# Patient Record
Sex: Male | Born: 1935 | Race: Black or African American | Hispanic: No | Smoking: Former smoker
Health system: Southern US, Community
[De-identification: ages and names within clinical notes are randomized; demographics above are authoritative.]

## PROBLEM LIST (undated history)

## (undated) DIAGNOSIS — E119 Type 2 diabetes mellitus without complications: Secondary | ICD-10-CM

## (undated) DIAGNOSIS — E785 Hyperlipidemia, unspecified: Secondary | ICD-10-CM

## (undated) DIAGNOSIS — I2510 Atherosclerotic heart disease of native coronary artery without angina pectoris: Secondary | ICD-10-CM

## (undated) DIAGNOSIS — I2109 ST elevation (STEMI) myocardial infarction involving other coronary artery of anterior wall: Secondary | ICD-10-CM

## (undated) DIAGNOSIS — I10 Essential (primary) hypertension: Secondary | ICD-10-CM

## (undated) HISTORY — DX: Essential (primary) hypertension: I10

## (undated) HISTORY — PX: CATARACT EXTRACTION: SUR2

## (undated) HISTORY — DX: ST elevation (STEMI) myocardial infarction involving other coronary artery of anterior wall: I21.09

## (undated) HISTORY — DX: Type 2 diabetes mellitus without complications: E11.9

## (undated) HISTORY — DX: Atherosclerotic heart disease of native coronary artery without angina pectoris: I25.10

## (undated) HISTORY — DX: Hyperlipidemia, unspecified: E78.5

---

## 2002-01-06 DIAGNOSIS — I2109 ST elevation (STEMI) myocardial infarction involving other coronary artery of anterior wall: Secondary | ICD-10-CM

## 2002-01-06 DIAGNOSIS — I2119 ST elevation (STEMI) myocardial infarction involving other coronary artery of inferior wall: Secondary | ICD-10-CM

## 2002-01-06 HISTORY — DX: ST elevation (STEMI) myocardial infarction involving other coronary artery of inferior wall: I21.19

## 2002-01-06 HISTORY — DX: ST elevation (STEMI) myocardial infarction involving other coronary artery of anterior wall: I21.09

## 2002-09-04 ENCOUNTER — Emergency Department (HOSPITAL_COMMUNITY): Admission: EM | Admit: 2002-09-04 | Discharge: 2002-09-04 | Payer: Self-pay | Admitting: Emergency Medicine

## 2002-09-04 ENCOUNTER — Inpatient Hospital Stay (HOSPITAL_COMMUNITY): Admission: EM | Admit: 2002-09-04 | Discharge: 2002-09-09 | Payer: Self-pay | Admitting: *Deleted

## 2002-09-04 ENCOUNTER — Encounter: Payer: Self-pay | Admitting: Emergency Medicine

## 2002-09-05 HISTORY — PX: CORONARY ANGIOPLASTY WITH STENT PLACEMENT: SHX49

## 2008-11-01 HISTORY — PX: NM MYOCAR PERF WALL MOTION: HXRAD629

## 2009-09-14 ENCOUNTER — Emergency Department (HOSPITAL_COMMUNITY): Admission: EM | Admit: 2009-09-14 | Discharge: 2009-09-14 | Payer: Self-pay | Admitting: Emergency Medicine

## 2010-05-24 NOTE — Cardiovascular Report (Signed)
NAME:  Terrance Miller, Terrance Miller NO.:  [id number]   MEDICAL RECORD NO.:  [id number]                   PATIENT TYPE:  INP   LOCATION:                                       FACILITY:  [facility]   PHYSICIAN:  Nicki Guadalajara, M.D.                  DATE OF BIRTH:  [birth year]   DATE OF PROCEDURE:  DATE OF DISCHARGE:                              CARDIAC CATHETERIZATION   HISTORY OF PRESENT ILLNESS:  [name] is a 77 year old African-  American male who has never seen a doctor.  At approximately 5 p.m. on  Saturday evening while cutting the grass he developed significant substernal  chest pressure.  The chest pain persisted essentially all night.  He  ultimately presented to [facility] on Sunday morning where his ECG  suggested possible evolving inferior wall MI.  The patient was heparinized  and shipped to [facility].  Upon arrival to [facility] the  patient was significantly improved with reference to chest pain.  At that  time discussion was made concerning possible emergent catheterization but  the catheterization laboratory was tied up with another emergency.  Consequently, the patient was given Integrilin.  He continued to be entirely  pain-free.  With the onset from initial symptoms of approximately 19 hours,  decision was made to continue stabilization with plans for catheterization  today.  Initial CPK was 785 with a 155 MB fraction.  Peak CK was 1162 with a  212 MB fraction.  The patient has remained symptom-free.   PROCEDURE:  After pre medication with Valium 5 mg intravenously, the patient  was prepped and draped in the usual fashion.  His right femoral artery was  punctured anteriorly and a 6-French arterial sheath was inserted.  Diagnostic catheterization was done with 6-French Judkins 4 left and right  coronary catheters.  The right catheter was also used for selective  angiography and the left subclavian internal mammary  artery system in the  event that patient ultimately required CBG surgery.  Pigtail catheter was  used for biplane cineangiographic left ventriculography as well as distal  aortography.  With the demonstration of total occlusion of the RCA, but with  only mild hypocontractility in the inferolateral segment with some  suggestion of some faint collaterals, although very minimal, the decision  was made to attempt intervention to open up his probably large right  coronary system.  The patient had been maintained on Integrilin since  yesterday.  ACT was recorded and he was given 4800 units of weight adjusted  heparin.  200 mcg IC nitroglycerin was also administered down the right  coronary artery without any improvement.  The interventional procedure was  difficult secondary to initial catheter back-up difficulties and also  significant difficulty in crossing the total occlusion.  Initially, an FL4  right guide was used for the procedure and this  was ultimately changed to a  hockey stick guide with side holes.  Multiple wires were used with a 2.5 mm  Maverick balloon in an attempt to provide additional support to cross the  total occlusion.  The wires included a Patriot wire, an extra support  Trooper, a 300 cm Cross-It 300 and ultimately the total occlusion was  crossed with a mailman wire.  Once the lesion was crossed it was apparent  that there was very severe diffuse disease extending through the entire mid  segment almost just proximal to the region of the crux.  The 2.5 balloon was  used to dilate this entire proximal and mid region.  This was then upgraded  to a 3.0 x 30 mm Maverick balloon with dilatation in this entire segment.  This restored TIMI 3 flow.  Tandem stenting was then done with insertion of  a 3.0 x 33 mm drug eluting Cypher stent placed proximal to the crux in the  mid right coronary artery.  This was dilated to 16 atmospheres.  A second  stent was placed 3.0 x 23 mm in  tandem proximal to the initially placed  stent with this 23 mm extending to the most proximal portion of the native  RCA.  Again, this was dilated to 17 atmospheres.  A 3.5 x 20 mm Quantum  balloon was then used for post dilatation.  The entire stented segment was  initially dilated to 8 atmospheres.  Ultimately, taper was obtained such  that the most proximal portion was dilated to 3.5 mm and the distal portion  of the more distal stent was dilated to 3.42 mm.  This resulted in TIMI 3  flow.  There was no evidence for dissection.  The patient tolerated the  procedure well.  ACT was documented to be therapeutic.  Of note, prior to  starting the intervention, again, a venous sheath had been inserted in the  event pacemaker would be necessary.   HEMODYNAMIC DATA:  1. Central aortic pressure 105/65, mean 81.  2. Left ventricular pressure 105/15, post A-wave 19.   ANGIOGRAPHIC DATA:  1. Left main coronary artery was moderate sized vessel that had mild 10-20%     ostial taper and bifurcated into an LAD and left circumflex system.  2. The LAD gave rise to a proximal diagonal vessel which had 80% proximal     stenosis followed by 70-80% mid stenosis in this diagonal vessel.  The     LAD at the diagonal had diffuse narrowing of approximately 50% in a     region which also seemed to have a component of intramyocardial bridging.     The mid portion of the LAD had focal narrowing of 80-90%.  3. The circumflex had smooth 20% ostial narrowing.  After a takeoff of a     proximal OM 1 vessel the AV groove circumflex had smooth 70-80%     narrowing.  The OM 2 vessel was much smaller in caliber and had diffuse     narrowing of 50%.  The mid AV groove had narrowing of 80% and in the more     distal aspect of the circumflex there was 90-95% focal stenosis.  There     was faint collateralization to the distal RCA via the left coronary     injection. 4. The right coronary artery was totally occluded  proximally and there was     evidence for thrombus.  5. Because of the potential for possible CBG surgery,  left subclavian and     internal mammary artery were injected.  This did show a 50% narrowing in     the proximal subclavian artery prior to a LIMA takeoff.  The LIMA itself     was angiographically normal.   BIPLANE CINEANGIOGRAPHIC LEFT VENTRICULOGRAPHY:  Biplane cineangiographic  left ventriculography revealed preserved global contractility with an  ejection fraction of approximately 50 to less than 55%.  On the RAO  projection there was only very subtle, mild mid inferior hypocontractility.  On the LAO projection there was low inferolateral mild hypocontractility.   DISTAL AORTOGRAPHY:  Distal aortography did not demonstrate any renal artery  stenosis.  There was no significant aortoiliac disease.   Following difficult, but ultimately successful intervention to the right  coronary artery, once the right coronary artery was able to be crossed  successfully, it became apparent that the entire proximal and mid segment  were diffusely narrowed to 90+%.  Following successful balloon dilatation  and ultimate tandem stenting with a 3.0 x 33 mm Cypher stent placed in the  mid segment and a 3.0 x 23 mm stent placed in tandem extending proximally  with post stent dilatation to a vessel size of 3.5 proximally to 3.4  distally, the entire region was reduced to 0%.  It was TIMI 3 flow.  The RCA  was a large caliber vessel that gave rise to a large PDA system in mid  inferior LV branch as well as a posterolateral vessel.  There was 60%  narrowing in the mid distal PDA.  Initial TIMI flow at the start of the  procedure was TIMI 0, at the completion of the procedure was TIMI 3.   IMPRESSION:  1. Mild left ventricular dysfunction with mild mid inferior and low     posterolateral hypocontractility.  2. Multivessel coronary obstructive disease with 80% proximal first diagonal     and 70-80%  mid diagonal stenoses of the left anterior descending, 50%     proximal to mid left anterior descending stenoses in an intramyocardial     segment, and 80-90% mid left anterior descending stenosis, 70-80%     stenosis in the AV groove portion of the circumflex vessel after obtuse     marginal 1 takeoff with 50% narrowing in the small obtuse marginal 2     vessel and tandem 80 and 90% mid distal AV groove and distal circumflex     stenoses with faint left to right collaterals.  3. 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  4. Difficult, but successful coronary intervention of the right coronary     artery with resultant PTCA of a diffusely diseased right coronary artery     after the occluded segment and ultimate tandem stenting with a 3.0 x 33     mm and 3.0 x 23 mm drug eluting Cypher stent post dilated with a stent     taper from 3.5 to 3.4 mm done with Integrilin, weight adjusted    heparinization, as well as oral Plavix.   DISCUSSION:  [name] presented yesterday with an evolving inferior MI of  probably 19 hours' duration.  His ECG on presentation showed improvement in  the ST segment depression which had been present at [facility] in V4 and V6  and the patient remained pain-free with Integrilin, heparin, and  nitroglycerin as well as beta blocker therapy.  He also was started on  Statin therapy.  Catheterization today verifies total occlusion of  the RCA  but since there was not extensive akinesis in this segment, decision was  made to establish flow in this vessel.  There was some discussion concerning  CBG revascularization surgery.  However, once the right coronary artery was  opened there was very diffuse disease and it was felt that this would be  best treated with stenting rather than primary balloon angioplasty.  The  patient ultimately will be stabilized following his MI and plans will be  made for possible staged intervention of his other vascular  lesions if  appropriate.                                               Nicki Guadalajara, M.D.    TK/MEDQ  D:  09/05/2002  T:  09/05/2002  Job:  [id number]   cc:   Hilario Quarry, M.D.  [address]  [place]  [place] [address]  Fax: [phone]

## 2010-05-24 NOTE — Discharge Summary (Signed)
Terrance Miller, POORMAN [facility]                        ACCOUNT NO.:  [id number]   MEDICAL RECORD NO.:  [id number]                   PATIENT TYPE:  INP   LOCATION:  4712                                 FACILITY:  [facility]   PHYSICIAN:  Terrance Gobble, MD                    DATE OF BIRTH:  10-11-1928   DATE OF ADMISSION:  DATE OF DISCHARGE:  09/09/2002                                 DISCHARGE SUMMARY   DISCHARGE DIAGNOSES:  1. Acute inferior myocardial infarction with total right coronary artery     including rescue angioplasty and stent.  2. Residual coronary disease, first stage intervention of the left anterior     descending artery and left circumflex.  3. New type 2 diabetes mellitus.  4. Urinalysis urinary tract infection.  5. Hypertension, resolved.   CONDITION ON DISCHARGE:  Improved.   PROCEDURE:  On September 05, 2002, combined left heart cath by Dr. Nicki Miller  with 100% RCA stenosis.  On September 05, 2002, percutaneous coronary  angioplasty and stent deployment to the 100% stenoses right coronary artery,  placing 2 Cypher stents.   DISCHARGE MEDICATIONS:  1. Enteric-coated aspirin 81 mg daily.  2. Plavix 75 mg daily.  3. Protonix 40 mg daily for 1 month, then as needed.  4. Lipitor 20 mg daily at bedtime.  5. Avandia 40 mg 1 at breakfast.  6. Lasix 20 mg 1 daily.  7. Lopressor 1/2 of a 50 mg tablet 3 times a day.  8. Altace 2.5 daily.  9. Nitroglycerin 1/150 sublingual p.r.n. chest pain.  10.      Cipro 250 mg twice a day for 3 days.   DISCHARGE INSTRUCTIONS:  1. Have blood work done in 1 week in [place], September 15, 2002.  2. No strenuous activity, no lifting over 10 pounds, no driving, no work     until seen by Dr. Tresa Miller in the office.  3. Low fat, low salt diabetic diet.  4. Watch cath site for signs of any bleeding, swelling or drainage.  5. Check your glucose 4 times a day, keep a log to take to primary care     doctor.  6. Follow up with Dr. Tresa Miller September 27, 2002 at 11:30.  Need to see a     physician/primary care in [place] to monitor diabetes, possibly to     [facility].   HISTORY OF PRESENT ILLNESS:  A 77 year old male was seen in the emergency  room at [facility] with no past medical history at all, had never seen a  physician. Developed substernal chest pain on 09/03/02 while mowing.  No  associated symptoms of nausea or vomiting, shortness of breath, radiation or  diaphoresis.  He took aspirin x1 but had pain all night.  Went to [facility]  ER first on 09/04/02, EKG showed increased inferior ST changes.  CK-MB was  positive, and he still  had some discomfort.   PAST MEDICAL HISTORY:  Negative, no history of any surgery, no outpatient  medications.   ALLERGIES:  NO KNOWN ALLERGIES.   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  He raised  his wife's brother's children, 7 children.   FAMILY HISTORY:  No coronary disease.  One sister 19, father died at 53,  mother died at 72.   REVIEW OF SYSTEMS:  No GI bleeding, no dysuria, no chills.   DISCHARGE PHYSICAL EXAMINATION:  VITAL SIGNS:  Blood pressure 115/61, pulse  62, respirations 20, temperature 97.7, room air oxygen saturation 97%.  LUNGS:  Rare crackles right base, otherwise clear.  CARDIOVASCULAR:  Regular rate and rhythm, S1/S2.  ABDOMEN:  Obese, benign, postive bowel sounds.  EXTREMITIES:  With 1+ edema bilaterally.   LABORATORY DATA:  On admission:  Hemoglobin 11.9, hematocrit 35.4, WBC 1.7,  platelets 193, neutrophils 78, lymphs 13, monos 8, eos 0, baso 0.  Pro time 14.2, INR 1.1, PTT on heparin was therapeutic.  Chemistries:  Sodium 138, potassium 4.5, chloride 106, CO2 20, glucose 253, BUN 19,  creatinine 1.5, calcium 9.1, total protein 7.4, albumin 3.6.  Sodium dropped  to a low of 133, on discharge 134. Glucose ranged from 313 down to 156.  AST  was 80, ALT 41, total bilirubin 1.5, magnesium 1.9. AST did come down to 57,  ALT 57, probably secondary  to MI. WBC prior to discharge was down to 7.4,  hemoglobin 10.7, hematocrit 31.6, platelets 182.   Cardiac enzymes:  CK 1070, 1059, 1162, 1136 and 319.  MB 212, 212, 181,  11.1.  Troponin 11.59, 16.11 peak.  Cholesterol 198, triglyceride 61, HDL  48, LDL 138.   EKG on August 29:  Sinus rhythm with occasional PAC's, ST elevation and Q  waves in lead 3 and aVF.  Follow up on August 30:  Sinus rhythm, inferior  infarction, no ischemia suspected.  Again, follow up on August 30:  Inferior  infarction, sinus rhythm.  Chest x-ray results are not currently in the  chart.   Cardiac catheterization report initially 100% RCA stenosis, residual disease  in the circumflex and the LAD, for continued intervention, EF of about 50%.  Intervention as described previously.   HOSPITAL COURSE:  Terrance Miller was transferred from [facility] on the  evening of September 04, 2002 with chest pain and ST changes.  He was still on  IV heparin, nitroglycerin and Integrilin. He was placed in the CCU.  By  September 05, 2002, he was ready for cardiac catheterization which was  completed with results as stated. Cypher stents were placed.  The patient  continued to improve, had some peak fever of 101.2.  No further chest pain,  was feeling much better. Cardiac rehab saw him and ambulated the patient.  Diabetic coordinator saw the patient and medicines were adjusted for  diabetes.   By September 2, he was ambulating without problems. He had been given IV  fluids for hypotension, which resolved.  Plans were for staged PCI to the  LAD and the circumflex.   He continued to improve and by September 09, 2002, he was ready for discharge  home. The patient will follow up with Dr. Tresa Miller as an outpatient. Also, new  immediate primary care physician for diabetes management.      Darcella Gasman. [place], [place].P.  Terrance Gobble, MD   LRI/MEDQ  D:  09/22/2002  T:  09/24/2002  Job:  [id number]

## 2012-07-21 ENCOUNTER — Telehealth: Payer: Self-pay | Admitting: Cardiovascular Disease

## 2012-07-21 NOTE — Telephone Encounter (Signed)
Wants to know if you received fax on yesterday!

## 2012-07-21 NOTE — Telephone Encounter (Signed)
Returned a call to patient from earlier today in reference to a ? Order that was supposedly faxed. No answer or answering machine. I will await another call from the patient.

## 2012-07-26 ENCOUNTER — Other Ambulatory Visit: Payer: Self-pay | Admitting: *Deleted

## 2012-07-26 MED ORDER — METOPROLOL TARTRATE 50 MG PO TABS: 50.0000 mg | ORAL_TABLET | Freq: Two times a day (BID) | ORAL | Status: DC

## 2012-07-27 ENCOUNTER — Other Ambulatory Visit: Payer: Self-pay | Admitting: *Deleted

## 2012-07-27 MED ORDER — EZETIMIBE 10 MG PO TABS: 10.0000 mg | ORAL_TABLET | Freq: Every day | ORAL | Status: DC

## 2012-07-29 ENCOUNTER — Other Ambulatory Visit: Payer: Self-pay | Admitting: *Deleted

## 2012-07-29 MED ORDER — SIMVASTATIN 40 MG PO TABS: 40.0000 mg | ORAL_TABLET | Freq: Every day | ORAL | Status: DC

## 2013-02-11 ENCOUNTER — Telehealth: Payer: Self-pay | Admitting: *Deleted

## 2013-02-11 NOTE — Telephone Encounter (Signed)
Informed company not to send prescription request for patient's urology supplies. Dr. Claiborne Billings will not prescribe these supplies. We are the patient's cardiologist. They wii remove from list.

## 2013-03-28 ENCOUNTER — Other Ambulatory Visit: Payer: Self-pay

## 2013-03-28 MED ORDER — LISINOPRIL 10 MG PO TABS
10.0000 mg | ORAL_TABLET | Freq: Every day | ORAL | Status: DC
Start: 2013-03-28 — End: 2013-09-26

## 2013-03-28 MED ORDER — CLOPIDOGREL BISULFATE 75 MG PO TABS: 75.0000 mg | ORAL_TABLET | Freq: Every day | ORAL | Status: DC

## 2013-03-28 NOTE — Telephone Encounter (Signed)
Rx was sent to pharmacy electronically. 

## 2013-08-15 ENCOUNTER — Other Ambulatory Visit: Payer: Self-pay | Admitting: Cardiovascular Disease

## 2013-08-15 NOTE — Telephone Encounter (Signed)
Rx refill sent to patient pharmacy after speaking to patient daughter and reminding her he needs an appointment before any further refills can be advised

## 2013-09-26 ENCOUNTER — Other Ambulatory Visit: Payer: Self-pay | Admitting: *Deleted

## 2013-09-26 MED ORDER — EZETIMIBE 10 MG PO TABS: 10.0000 mg | ORAL_TABLET | Freq: Every day | ORAL | Status: DC

## 2013-09-26 MED ORDER — LISINOPRIL 10 MG PO TABS: 10.0000 mg | ORAL_TABLET | Freq: Every day | ORAL | Status: DC

## 2013-09-26 MED ORDER — CLOPIDOGREL BISULFATE 75 MG PO TABS
75.0000 mg | ORAL_TABLET | Freq: Every day | ORAL | Status: DC
Start: 2013-09-26 — End: 2013-10-31

## 2013-09-27 ENCOUNTER — Other Ambulatory Visit: Payer: Self-pay | Admitting: *Deleted

## 2013-09-28 ENCOUNTER — Other Ambulatory Visit: Payer: Self-pay | Admitting: Cardiovascular Disease

## 2013-09-28 ENCOUNTER — Encounter: Payer: Self-pay | Admitting: Cardiovascular Disease

## 2013-09-28 ENCOUNTER — Ambulatory Visit (INDEPENDENT_AMBULATORY_CARE_PROVIDER_SITE_OTHER): Payer: Medicare Other | Admitting: Cardiovascular Disease

## 2013-09-28 VITALS — BP 182/100 | HR 63 | Ht 66.0 in | Wt 198.7 lb

## 2013-09-28 DIAGNOSIS — I119 Hypertensive heart disease without heart failure: Secondary | ICD-10-CM

## 2013-09-28 DIAGNOSIS — E785 Hyperlipidemia, unspecified: Secondary | ICD-10-CM

## 2013-09-28 DIAGNOSIS — E559 Vitamin D deficiency, unspecified: Secondary | ICD-10-CM

## 2013-09-28 DIAGNOSIS — I2510 Atherosclerotic heart disease of native coronary artery without angina pectoris: Secondary | ICD-10-CM

## 2013-09-28 DIAGNOSIS — R5383 Other fatigue: Secondary | ICD-10-CM

## 2013-09-28 DIAGNOSIS — R5381 Other malaise: Secondary | ICD-10-CM

## 2013-09-28 DIAGNOSIS — I10 Essential (primary) hypertension: Secondary | ICD-10-CM | POA: Insufficient documentation

## 2013-09-28 DIAGNOSIS — E782 Mixed hyperlipidemia: Secondary | ICD-10-CM

## 2013-09-28 DIAGNOSIS — E1169 Type 2 diabetes mellitus with other specified complication: Secondary | ICD-10-CM | POA: Insufficient documentation

## 2013-09-28 DIAGNOSIS — R609 Edema, unspecified: Secondary | ICD-10-CM

## 2013-09-28 DIAGNOSIS — I252 Old myocardial infarction: Secondary | ICD-10-CM | POA: Insufficient documentation

## 2013-09-28 DIAGNOSIS — R600 Localized edema: Secondary | ICD-10-CM | POA: Insufficient documentation

## 2013-09-28 MED ORDER — AMLODIPINE BESYLATE 5 MG PO TABS: 5.0000 mg | ORAL_TABLET | Freq: Every day | ORAL | Status: DC

## 2013-09-28 MED ORDER — FUROSEMIDE 40 MG PO TABS: 40.0000 mg | ORAL_TABLET | Freq: Every day | ORAL | Status: DC

## 2013-09-28 NOTE — Progress Notes (Signed)
Patient ID: Terrance Miller, male   DOB: 05-05-1928, 80 y.o.   MRN: [id number]     HPI: Terrance Miller is a 80 y.o. male who presents to the office today for a 22 month follow up cardiology evaluation.  [name] is an 80 year old African American male who suffered an inferior wall myocardial infarction in 2004 and was found to have total occlusion of his RCA with significant thrombus burden.  She underwent successful intervention with ultimate insertion of a Cypher drug-eluting stent.  He did have concomitant CAD with 80% first diagonal stenosis, 70-80% mid-diagonal stenosis, as well as 80% mid LAD stenosis.  In addition to stenosis in the circumflex vessel.  A 2 year followup nuclear perfusion study in 2013 was essentially unchanged from previously and showed possible nontransmural inferior basilar scar without associated ischemia.  According to his family members, he has not seen a physician over the past 2 years.  They're uncertain as to his compliance with his medical regimen.  He lives with his daughter.  He denies chest pain.  He denies PND or orthopnea.  He does note dizziness.  Admits to mild shortness of breath with activity.  He also admits to leg swelling.  He presents for evaluation.  He also is a history of hypertension and hyperlipidemia.  Past Medical History  Diagnosis Date  . Hypertension   . Diabetes mellitus without complication   . Coronary artery disease   . Hyperlipidemia     History reviewed. No pertinent past surgical history.  No Known Allergies  Current Outpatient Prescriptions  Medication Sig Dispense Refill  . aspirin 81 MG tablet Take 81 mg by mouth daily.      . clopidogrel (PLAVIX) 75 MG tablet Take 1 tablet (75 mg total) by mouth daily. PATIENT NEEDS AN APPOINTMENT FOR FUTURE REFILLS.  30 tablet  0  . ezetimibe (ZETIA) 10 MG tablet Take 10 mg by mouth daily.      Marland Kitchen lisinopril (PRINIVIL,ZESTRIL) 10 MG tablet Take 10 mg by mouth daily.      . metoprolol  (LOPRESSOR) 50 MG tablet Take 1 tablet (50 mg total) by mouth 2 (two) times daily. **Patient MUST make and appointment for further refills**  60 tablet  2  . simvastatin (ZOCOR) 40 MG tablet Take 1 tablet (40 mg total) by mouth at bedtime.  90 tablet  3  . amLODipine (NORVASC) 5 MG tablet Take 1 tablet (5 mg total) by mouth daily.  30 tablet  6  . furosemide (LASIX) 40 MG tablet Take 1 tablet (40 mg total) by mouth daily.  30 tablet  6   No current facility-administered medications for this visit.    History   Social History  . Marital Status: Widowed    Spouse Name: N/A    Number of Children: N/A  . Years of Education: N/A   Occupational History  . Not on file.   Social History Main Topics  . Smoking status: Never Smoker   . Smokeless tobacco: Never Used  . Alcohol Use: No  . Drug Use: Not on file  . Sexual Activity: Not on file   Other Topics Concern  . Not on file   Social History Narrative  . No narrative on file   Socially he lives with his daughter.  He is widowed.  He quit smoking greater than 20 years ago.  Family History  Problem Relation Age of Onset  . Hypertension Father   . Diabetes Sister  ROS General: Negative; No fevers, chills, or night sweats HEENT: Negative; No changes in vision or hearing, sinus congestion, difficulty swallowing Pulmonary: Negative; No cough, wheezing, shortness of breath, hemoptysis Cardiovascular: See HPI: No chest pain, presyncope, syncope, palpitations Positive for leg swelling GI: Negative; No nausea, vomiting, diarrhea, or abdominal pain GU: Negative; No dysuria, hematuria, or difficulty voiding Musculoskeletal: Negative; no myalgias, joint pain, or weakness Hematologic: Negative; no easy bruising, bleeding Endocrine: Negative; no heat/cold intolerance; no diabetes, Neuro: Negative; no changes in balance, headaches Skin: Negative; No rashes or skin lesions Psychiatric: Negative; No behavioral problems,  depression Sleep: Negative; No snoring,  daytime sleepiness, hypersomnolence, bruxism, restless legs, hypnogognic hallucinations. Other comprehensive 14 point system review is negative   Physical Exam BP 182/100  Pulse 63  Ht 5\' 6"  (1.676 m)  Wt 198 lb 11.2 oz (90.13 kg)  BMI 32.09 kg/m2 Repeat blood pressure by me was 180/92. General: Alert, oriented, no distress.  Skin: normal turgor, no rashes, warm and dry HEENT: Normocephalic, atraumatic. Pupils equal round and reactive to light; sclera anicteric; extraocular muscles intact, No lid lag; Nose without nasal septal hypertrophy; Mouth/Parynx benign; Mallinpatti scale 3 Neck: No JVD, no carotid bruits; normal carotid upstroke Lungs: clear to ausculatation and percussion bilaterally; no wheezing or rales, normal inspiratory and expiratory effort Chest wall: without tenderness to palpitation Heart: PMI not displaced, RRR, s1 s2 normal, 2/6 systolic murmur, No diastolic murmur, no rubs, gallops, thrills, or heaves Abdomen: soft, nontender; no hepatosplenomehaly, BS+; abdominal aorta nontender and not dilated by palpation. Back: no CVA tenderness Pulses: 2+  Musculoskeletal: full range of motion, normal strength, no joint deformities Extremities: 1+ leg edema.  Mild areas of discoloration on the pretibial region bilaterally; Pulses 2+, no clubbing cyanosis , Homan's sign negative  Neurologic: grossly nonfocal; Cranial nerves grossly wnl Psychologic: Normal mood and affect   ECG (independently read by me): Sinus rhythm with PACs.  LVH by voltage criteria.  Nonspecific ST changes.  LABS:  BMET No results found for this basename: na, k, cl, co2, glucose, bun, creatinine, calcium, gfrnonaa, gfraa     Hepatic Function Panel  No results found for this basename: prot, albumin, ast, alt, alkphos, bilitot, bilidir, ibili     CBC No results found for this basename: wbc, rbc, hgb, hct, plt, mcv, mch, mchc, rdw, neutrabs, lymphsabs,  monoabs, eosabs, basosabs     BNP No results found for this basename: probnp    Lipid Panel  No results found for this basename: chol, trig, hdl, cholhdl, vldl, ldlcalc, ldldirect     RADIOLOGY: No results found.    ASSESSMENT AND PLAN: Mr. Danzell Birky is an 80 year old, African American male, who is 11 years status post inferior wall myocardial infarction, for which he was treated with successful intervention to a totally occluded RCA with Cypher stent insertion.  He's been on medical therapy for, and CAD.  He does have stage II hypertension, today.  I am recommending the addition of amlodipine 5 mg to his medical regimen.  Will also start him on furosemide 40 mg, which did help his current leg edema and also blood pressure control.  A complete set of blood will be obtained consisting of a conference of metabolic panel, CBC, lipid panel, TSH, vitamin D level, as well as urinalysis.  Schedule him for echo Doppler study to evaluate both systolic and diastolic function as well as valvular architecture.  Adjustments will be made if necessary to his medical regimen. Currently he is on simvastatin  40 mg.  Once I see his lipid panel, this most likely will be reduced to 20 mg since he will be started on amlodipine if his lipid studies are stable, or has lipid studies are elevated he will be changed to more aggressive therapy with atorvastatin or Crestor or Zetia will be added to his reduced dose of simvastatin.Marland Kitchen  He will return in 6 weeks for followup evaluation.   Troy Sine, MD, [facility]  09/28/2013 6:05 PM

## 2013-09-28 NOTE — Patient Instructions (Signed)
Your physician has requested that you have an echocardiogram. Echocardiography is a painless test that uses sound waves to create images of your heart. It provides your doctor with information about the size and shape of your heart and how well your heart's chambers and valves are working. This procedure takes approximately one hour. There are no restrictions for this procedure.  Your physician recommends that you return for lab work fasting.  Your physician has recommended you make the following change in your medication: start new prescriptions given today for amlodipine 5 mg and furosemide 40 mg. These prescriptions has already been sent to your pharmacy.  Your physician recommends that you schedule a follow-up appointment in: 6 weeks.

## 2013-09-29 LAB — LIPID PANEL
Cholesterol: 153 mg/dL (ref 0–200)
HDL: 49 mg/dL (ref >39)
LDL CALC: 88 mg/dL (ref 0–99)
TRIGLYCERIDES: 79 mg/dL (ref <150)
Total CHOL/HDL Ratio: 3.1 Ratio
VLDL: 16 mg/dL (ref 0–40)

## 2013-09-29 LAB — COMPREHENSIVE METABOLIC PANEL
ALBUMIN: 3.9 g/dL (ref 3.5–5.2)
ALK PHOS: 45 U/L (ref 39–117)
ALT: 33 U/L (ref 0–53)
AST: 27 U/L (ref 0–37)
BILIRUBIN TOTAL: 1.2 mg/dL (ref 0.2–1.2)
BUN: 14 mg/dL (ref 6–23)
CO2: 26 mEq/L (ref 19–32)
Calcium: 9.1 mg/dL (ref 8.4–10.5)
Chloride: 106 mEq/L (ref 96–112)
Creat: 1.26 mg/dL (ref 0.50–1.35)
Glucose, Bld: 94 mg/dL (ref 70–99)
POTASSIUM: 4.1 meq/L (ref 3.5–5.3)
SODIUM: 140 meq/L (ref 135–145)
Total Protein: 6.9 g/dL (ref 6.0–8.3)

## 2013-09-29 LAB — TSH: TSH: 1.463 u[IU]/mL (ref 0.350–4.500)

## 2013-09-29 LAB — CBC
HCT: 38.1 % — ABNORMAL LOW (ref 39.0–52.0)
Hemoglobin: 12.8 g/dL — ABNORMAL LOW (ref 13.0–17.0)
MCH: 32.3 pg (ref 26.0–34.0)
MCHC: 33.6 g/dL (ref 30.0–36.0)
MCV: 96.2 fL (ref 78.0–100.0)
Platelets: 154 10*3/uL (ref 150–400)
RBC: 3.96 MIL/uL — ABNORMAL LOW (ref 4.22–5.81)
RDW: 15.2 % (ref 11.5–15.5)
WBC: 6.7 10*3/uL (ref 4.0–10.5)

## 2013-09-29 LAB — VITAMIN D 25 HYDROXY (VIT D DEFICIENCY, FRACTURES): Vit D, 25-Hydroxy: 21 ng/mL — ABNORMAL LOW (ref 30–89)

## 2013-10-03 ENCOUNTER — Ambulatory Visit (HOSPITAL_COMMUNITY)
Admission: RE | Admit: 2013-10-03 | Discharge: 2013-10-03 | Disposition: A | Discharge status: Home / Self Care | Payer: Medicare Other | Source: Ambulatory Visit | Attending: Internal Medicine | Admitting: Internal Medicine

## 2013-10-03 DIAGNOSIS — I359 Nonrheumatic aortic valve disorder, unspecified: Secondary | ICD-10-CM

## 2013-10-03 DIAGNOSIS — I10 Essential (primary) hypertension: Secondary | ICD-10-CM | POA: Insufficient documentation

## 2013-10-03 DIAGNOSIS — E119 Type 2 diabetes mellitus without complications: Secondary | ICD-10-CM | POA: Diagnosis not present

## 2013-10-03 DIAGNOSIS — I2510 Atherosclerotic heart disease of native coronary artery without angina pectoris: Secondary | ICD-10-CM | POA: Diagnosis not present

## 2013-10-03 DIAGNOSIS — E785 Hyperlipidemia, unspecified: Secondary | ICD-10-CM | POA: Insufficient documentation

## 2013-10-03 DIAGNOSIS — R5381 Other malaise: Secondary | ICD-10-CM

## 2013-10-03 DIAGNOSIS — R5383 Other fatigue: Secondary | ICD-10-CM

## 2013-10-03 NOTE — Progress Notes (Signed)
2D Echo Performed 10/03/2013    Marygrace Drought, RCS

## 2013-10-13 ENCOUNTER — Telehealth: Payer: Self-pay | Admitting: Cardiovascular Disease

## 2013-10-13 ENCOUNTER — Telehealth: Payer: Self-pay | Admitting: *Deleted

## 2013-10-13 NOTE — Telephone Encounter (Signed)
Informed patient and daughter of lab results and recommendations. Keep November appointment.

## 2013-10-13 NOTE — Telephone Encounter (Signed)
Message copied by Lauralee Evener on Thu Oct 13, 2013  3:51 PM ------      Message from: Shelva Majestic A      Created: Mon Oct 03, 2013  1:30 PM       Labs good; add Vit D 4000 units daily. ------

## 2013-10-13 NOTE — Telephone Encounter (Signed)
Please call,concerning getting diabetic supplies. She said tshe have sent a fax.

## 2013-10-13 NOTE — Telephone Encounter (Signed)
Message copied by Lauralee Evener on Thu Oct 13, 2013  3:50 PM ------      Message from: Shelva Majestic A      Created: Mon Oct 03, 2013  1:30 PM       Labs good; add Vit D 4000 units daily. ------

## 2013-10-31 ENCOUNTER — Other Ambulatory Visit: Payer: Self-pay | Admitting: Cardiovascular Disease

## 2013-11-01 NOTE — Telephone Encounter (Signed)
Rx was sent to pharmacy electronically. 

## 2013-11-18 ENCOUNTER — Encounter: Payer: Self-pay | Admitting: *Deleted

## 2013-11-21 ENCOUNTER — Telehealth: Payer: Self-pay | Admitting: Cardiovascular Disease

## 2013-11-22 ENCOUNTER — Ambulatory Visit: Payer: Medicare Other | Admitting: Cardiovascular Disease

## 2013-11-22 ENCOUNTER — Telehealth: Payer: Self-pay | Admitting: Cardiovascular Disease

## 2013-11-22 MED ORDER — SIMVASTATIN 40 MG PO TABS: 40.0000 mg | ORAL_TABLET | Freq: Every day | ORAL | Status: DC

## 2013-11-22 MED ORDER — CLOPIDOGREL BISULFATE 75 MG PO TABS: 75.0000 mg | ORAL_TABLET | Freq: Every day | ORAL | Status: DC

## 2013-11-22 MED ORDER — LISINOPRIL 10 MG PO TABS: 10.0000 mg | ORAL_TABLET | Freq: Every day | ORAL | Status: DC

## 2013-11-22 MED ORDER — METOPROLOL TARTRATE 50 MG PO TABS: 50.0000 mg | ORAL_TABLET | Freq: Two times a day (BID) | ORAL | Status: DC

## 2013-11-22 MED ORDER — EZETIMIBE 10 MG PO TABS: 10.0000 mg | ORAL_TABLET | Freq: Every day | ORAL | Status: DC

## 2013-11-22 MED ORDER — FUROSEMIDE 40 MG PO TABS: 40.0000 mg | ORAL_TABLET | Freq: Every day | ORAL | Status: DC

## 2013-11-22 MED ORDER — AMLODIPINE BESYLATE 5 MG PO TABS: 5.0000 mg | ORAL_TABLET | Freq: Every day | ORAL | Status: DC
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
## 2013-11-22 NOTE — Telephone Encounter (Signed)
E-SENT PRESCRIPTIONS TO PHARMACY  Left message on voice mail meds sent.

## 2013-11-23 NOTE — Telephone Encounter (Signed)
No encounter information provided. Medications refilled per triage call on 11/17

## 2013-12-14 ENCOUNTER — Telehealth: Payer: Self-pay | Admitting: Cardiovascular Disease

## 2013-12-14 NOTE — Telephone Encounter (Signed)
Needs to speak with a nurse about pt's allergies

## 2013-12-14 NOTE — Telephone Encounter (Signed)
Returned call to [place] with [place] she wanted to verify patient's drug allergies.Advised patient has no known drug allergies.

## 2017-04-17 ENCOUNTER — Encounter (HOSPITAL_COMMUNITY): Payer: Self-pay | Admitting: Emergency Medicine

## 2017-04-17 ENCOUNTER — Other Ambulatory Visit: Payer: Self-pay

## 2017-04-17 ENCOUNTER — Emergency Department (HOSPITAL_COMMUNITY): Payer: Medicare HMO

## 2017-04-17 ENCOUNTER — Emergency Department (HOSPITAL_COMMUNITY)
Admission: EM | Admit: 2017-04-17 | Discharge: 2017-04-17 | Disposition: A | Discharge status: Home / Self Care | Payer: Medicare HMO | Attending: Emergency Medicine | Admitting: Emergency Medicine

## 2017-04-17 DIAGNOSIS — I2510 Atherosclerotic heart disease of native coronary artery without angina pectoris: Secondary | ICD-10-CM | POA: Insufficient documentation

## 2017-04-17 DIAGNOSIS — N433 Hydrocele, unspecified: Secondary | ICD-10-CM | POA: Insufficient documentation

## 2017-04-17 DIAGNOSIS — E119 Type 2 diabetes mellitus without complications: Secondary | ICD-10-CM | POA: Insufficient documentation

## 2017-04-17 DIAGNOSIS — I10 Essential (primary) hypertension: Secondary | ICD-10-CM | POA: Insufficient documentation

## 2017-04-17 DIAGNOSIS — R1030 Lower abdominal pain, unspecified: Secondary | ICD-10-CM | POA: Diagnosis present

## 2017-04-17 DIAGNOSIS — Z79899 Other long term (current) drug therapy: Secondary | ICD-10-CM | POA: Diagnosis not present

## 2017-04-17 NOTE — ED Notes (Signed)
Scrotal swelling x 1 week  Seen by Dr Cindie Laroche, put on flomax  Swelling continues and pt is uncomfortable

## 2017-04-17 NOTE — ED Triage Notes (Signed)
Pt c/o of groin swelling starting yesterday. Denies pain.  Saw PCP and placed on meds with no relief.

## 2017-04-17 NOTE — ED Provider Notes (Signed)
[facility] EMERGENCY DEPARTMENT Provider Note   CSN: [id number] Arrival date & time: 04/17/17  1052     History   Chief Complaint Chief Complaint  Patient presents with  . Groin Swelling    HPI Terrance Miller is a 84 y.o. male.  Patient complains of swelling to her scrotum for a month now.  Mild discomfort  The history is provided by the patient.  Groin Pain  This is a new problem. The current episode started more than 1 week ago. The problem occurs constantly. The problem has not changed since onset.Pertinent negatives include no chest pain, no abdominal pain and no headaches. Nothing aggravates the symptoms. Nothing relieves the symptoms. He has tried nothing for the symptoms. The treatment provided no relief.    Past Medical History:  Diagnosis Date  . Acute anterior wall MI ([place]) 2004  . Coronary artery disease   . Diabetes mellitus without complication ([place])   . Hyperlipidemia   . Hypertension     Patient Active Problem List   Diagnosis Date Noted  . Old myocardial infarction 09/28/2013  . Hypertension 09/28/2013  . Hyperlipidemia LDL goal <70 09/28/2013  . Lower extremity edema 09/28/2013  . CAD (coronary artery disease) 09/28/2013    Past Surgical History:  Procedure Laterality Date  . CORONARY ANGIOPLASTY WITH STENT PLACEMENT  09/05/2002   stent RCA, 80% first diagonal, 70-80% mid-diagonal, 80% mid LAD stenosis  . NM MYOCAR PERF WALL MOTION  11/01/2008   Normal        Home Medications    Prior to Admission medications   Medication Sig Start Date End Date Taking? Authorizing Provider  amLODipine (NORVASC) 10 MG tablet Take 10 mg by mouth daily.   Yes [provider]  amLODipine (NORVASC) 5 MG tablet Take 1 tablet (5 mg total) by mouth daily. 11/22/13  Yes Troy Sine, MD  furosemide (LASIX) 40 MG tablet Take 1 tablet (40 mg total) by mouth daily. 11/22/13  Yes Troy Sine, MD  meclizine (ANTIVERT) 25 MG tablet Take 25 mg by mouth  daily.   Yes [provider]  metoprolol (LOPRESSOR) 50 MG tablet Take 1 tablet (50 mg total) by mouth 2 (two) times daily. **Patient MUST make and appointment for further refills** Patient taking differently: Take 25 mg by mouth 2 (two) times daily. **Patient MUST make and appointment for further refills** 11/22/13  Yes Troy Sine, MD  tamsulosin (FLOMAX) 0.4 MG CAPS capsule Take 0.4 mg by mouth daily.   Yes [provider]  TRAVATAN Z 0.004 % SOLN ophthalmic solution Place 1 drop into both eyes 2 (two) times daily. 04/13/17  Yes [provider]    Family History Family History  Problem Relation Age of Onset  . Hypertension Father   . Diabetes Sister     Social History Social History   Tobacco Use  . Smoking status: Never Smoker  . Smokeless tobacco: Never Used  Substance Use Topics  . Alcohol use: No    Alcohol/week: 0.0 oz  . Drug use: No     Allergies   Patient has no known allergies.   Review of Systems Review of Systems  Constitutional: Negative for appetite change and fatigue.  HENT: Negative for congestion, ear discharge and sinus pressure.   Eyes: Negative for discharge.  Respiratory: Negative for cough.   Cardiovascular: Negative for chest pain.  Gastrointestinal: Negative for abdominal pain and diarrhea.  Genitourinary: Negative for frequency and hematuria.  Scrotum swelling  Musculoskeletal: Negative for back pain.  Skin: Negative for rash.  Neurological: Negative for seizures and headaches.  Psychiatric/Behavioral: Negative for hallucinations.     Physical Exam Updated Vital Signs BP (!) 147/92   Pulse (!) 55   Temp 97.7 F (36.5 C) (Oral)   Resp 18   Ht 5\' 5"  (1.651 m)   Wt 113.4 kg (250 lb)   SpO2 100%   BMI 41.60 kg/m   Physical Exam  Constitutional: He is oriented to person, place, and time. He appears well-developed.  HENT:  Head: Normocephalic.  Eyes: Conjunctivae and EOM are normal. No scleral  icterus.  Neck: Neck supple. No thyromegaly present.  Cardiovascular: Normal rate and regular rhythm. Exam reveals no gallop and no friction rub.  No murmur heard. Pulmonary/Chest: No stridor. He has no wheezes. He has no rales. He exhibits no tenderness.  Abdominal: He exhibits no distension. There is no tenderness. There is no rebound.  Genitourinary:  Genitourinary Comments: Patient with large swelling to the scrotum minimal tenderness  Musculoskeletal: Normal range of motion. He exhibits no edema.  Lymphadenopathy:    He has no cervical adenopathy.  Neurological: He is oriented to person, place, and time. He exhibits normal muscle tone. Coordination normal.  Skin: No rash noted. No erythema.  Psychiatric: He has a normal mood and affect. His behavior is normal.     ED Treatments / Results  Labs (all labs ordered are listed, but only abnormal results are displayed) Labs Reviewed - No data to display  EKG None  Radiology US Scrotum  Result Date: 04/17/2017 CLINICAL DATA:  Scrotal swelling. EXAM: ULTRASOUND OF SCROTUM TECHNIQUE: Complete ultrasound examination of the testicles, epididymis, and other scrotal structures was performed. COMPARISON:  None. FINDINGS: Right testicle Measurements: 4.0 x 2.3 x 2.6 cm. No mass or microlithiasis visualized. Left testicle Measurements: 4.6 x 2.1 x 2.6 cm. No mass or microlithiasis visualized. Right epididymis:  Normal in size and appearance. Left epididymis:  Not visualized. Hydrocele:  Large left hydrocele.  Trace right hydrocele. Varicocele:  None visualized. IMPRESSION: 1. Large left and trace right hydroceles. 2. Normal sonographic appearance of the bilateral testicles. Electronically Signed   By: Titus Dubin M.D.   On: 04/17/2017 13:23    Procedures Procedures (including critical care time)  Medications Ordered in ED Medications - No data to display   Initial Impression / Assessment and Plan / ED Course  I have reviewed the  triage vital signs and the nursing notes.  Pertinent labs & imaging results that were available during my care of the patient were reviewed by me and considered in my medical decision making (see chart for details).     Ultrasound shows large hydrocele in his scrotum.  Patient will take Tylenol Motrin for pain and is referred to urology  Final Clinical Impressions(s) / ED Diagnoses   Final diagnoses:  Hydrocele of testis    ED Discharge Orders    None       Milton Ferguson, MD 04/17/17 1409

## 2017-04-17 NOTE — Discharge Instructions (Signed)
Tylenol or motrin for pain and follow up with [facility] in 1-2 weeks

## 2017-04-17 NOTE — ED Notes (Signed)
In US 

## 2017-05-20 ENCOUNTER — Inpatient Hospital Stay (HOSPITAL_COMMUNITY): Payer: Medicare HMO

## 2017-05-20 ENCOUNTER — Inpatient Hospital Stay (HOSPITAL_COMMUNITY)
Admission: EM | Admit: 2017-05-20 | Discharge: 2017-05-25 | DRG: 871 | Disposition: A | Discharge status: Home Health | Payer: Medicare HMO | Attending: Family Medicine | Admitting: Family Medicine

## 2017-05-20 ENCOUNTER — Emergency Department (HOSPITAL_COMMUNITY): Payer: Medicare HMO

## 2017-05-20 ENCOUNTER — Other Ambulatory Visit: Payer: Self-pay

## 2017-05-20 ENCOUNTER — Encounter (HOSPITAL_COMMUNITY): Payer: Self-pay | Admitting: Emergency Medicine

## 2017-05-20 DIAGNOSIS — E785 Hyperlipidemia, unspecified: Secondary | ICD-10-CM | POA: Diagnosis present

## 2017-05-20 DIAGNOSIS — T68XXXA Hypothermia, initial encounter: Secondary | ICD-10-CM | POA: Diagnosis present

## 2017-05-20 DIAGNOSIS — I10 Essential (primary) hypertension: Secondary | ICD-10-CM | POA: Diagnosis present

## 2017-05-20 DIAGNOSIS — E860 Dehydration: Secondary | ICD-10-CM | POA: Diagnosis present

## 2017-05-20 DIAGNOSIS — E1159 Type 2 diabetes mellitus with other circulatory complications: Secondary | ICD-10-CM | POA: Diagnosis present

## 2017-05-20 DIAGNOSIS — Z955 Presence of coronary angioplasty implant and graft: Secondary | ICD-10-CM

## 2017-05-20 DIAGNOSIS — G9341 Metabolic encephalopathy: Secondary | ICD-10-CM | POA: Diagnosis not present

## 2017-05-20 DIAGNOSIS — N433 Hydrocele, unspecified: Secondary | ICD-10-CM | POA: Diagnosis present

## 2017-05-20 DIAGNOSIS — Z833 Family history of diabetes mellitus: Secondary | ICD-10-CM

## 2017-05-20 DIAGNOSIS — I2510 Atherosclerotic heart disease of native coronary artery without angina pectoris: Secondary | ICD-10-CM | POA: Diagnosis present

## 2017-05-20 DIAGNOSIS — Z8249 Family history of ischemic heart disease and other diseases of the circulatory system: Secondary | ICD-10-CM | POA: Diagnosis not present

## 2017-05-20 DIAGNOSIS — Z79899 Other long term (current) drug therapy: Secondary | ICD-10-CM | POA: Diagnosis not present

## 2017-05-20 DIAGNOSIS — R2681 Unsteadiness on feet: Secondary | ICD-10-CM | POA: Diagnosis present

## 2017-05-20 DIAGNOSIS — I252 Old myocardial infarction: Secondary | ICD-10-CM | POA: Diagnosis not present

## 2017-05-20 DIAGNOSIS — Z6836 Body mass index (BMI) 36.0-36.9, adult: Secondary | ICD-10-CM

## 2017-05-20 DIAGNOSIS — R6520 Severe sepsis without septic shock: Secondary | ICD-10-CM | POA: Diagnosis present

## 2017-05-20 DIAGNOSIS — R29704 NIHSS score 4: Secondary | ICD-10-CM | POA: Diagnosis present

## 2017-05-20 DIAGNOSIS — R600 Localized edema: Secondary | ICD-10-CM

## 2017-05-20 DIAGNOSIS — A419 Sepsis, unspecified organism: Principal | ICD-10-CM

## 2017-05-20 DIAGNOSIS — R410 Disorientation, unspecified: Secondary | ICD-10-CM | POA: Diagnosis present

## 2017-05-20 DIAGNOSIS — E1169 Type 2 diabetes mellitus with other specified complication: Secondary | ICD-10-CM | POA: Diagnosis present

## 2017-05-20 LAB — URINALYSIS, ROUTINE W REFLEX MICROSCOPIC
Bacteria, UA: NONE SEEN
Bilirubin Urine: NEGATIVE
GLUCOSE, UA: NEGATIVE mg/dL
KETONES UR: NEGATIVE mg/dL
NITRITE: NEGATIVE
PH: 5 (ref 5.0–8.0)
PROTEIN: NEGATIVE mg/dL
Specific Gravity, Urine: 1.012 (ref 1.005–1.030)

## 2017-05-20 LAB — I-STAT CHEM 8, ED
BUN: 37 mg/dL — ABNORMAL HIGH (ref 6–20)
CALCIUM ION: 1.21 mmol/L (ref 1.15–1.40)
CHLORIDE: 110 mmol/L (ref 101–111)
Creatinine, Ser: 2.3 mg/dL — ABNORMAL HIGH (ref 0.61–1.24)
GLUCOSE: 186 mg/dL — AB (ref 65–99)
HCT: 36 % — ABNORMAL LOW (ref 39.0–52.0)
HEMOGLOBIN: 12.2 g/dL — AB (ref 13.0–17.0)
Potassium: 4.6 mmol/L (ref 3.5–5.1)
SODIUM: 143 mmol/L (ref 135–145)
TCO2: 22 mmol/L (ref 22–32)

## 2017-05-20 LAB — CBC
HCT: 35.1 % — ABNORMAL LOW (ref 39.0–52.0)
HEMOGLOBIN: 11.7 g/dL — AB (ref 13.0–17.0)
MCH: 33 pg (ref 26.0–34.0)
MCHC: 33.3 g/dL (ref 30.0–36.0)
MCV: 98.9 fL (ref 78.0–100.0)
Platelets: 76 10*3/uL — ABNORMAL LOW (ref 150–400)
RBC: 3.55 MIL/uL — AB (ref 4.22–5.81)
RDW: 15.9 % — ABNORMAL HIGH (ref 11.5–15.5)
WBC: 7.4 10*3/uL (ref 4.0–10.5)

## 2017-05-20 LAB — DIFFERENTIAL
Basophils Absolute: 0 10*3/uL (ref 0.0–0.1)
Basophils Relative: 0 %
EOS ABS: 0.1 10*3/uL (ref 0.0–0.7)
EOS PCT: 2 %
LYMPHS ABS: 1 10*3/uL (ref 0.7–4.0)
LYMPHS PCT: 14 %
MONO ABS: 0.5 10*3/uL (ref 0.1–1.0)
Monocytes Relative: 7 %
NEUTROS PCT: 77 %
Neutro Abs: 5.8 10*3/uL (ref 1.7–7.7)

## 2017-05-20 LAB — COMPREHENSIVE METABOLIC PANEL
ALBUMIN: 3.2 g/dL — AB (ref 3.5–5.0)
ALK PHOS: 80 U/L (ref 38–126)
ALT: 95 U/L — ABNORMAL HIGH (ref 17–63)
ALT: 80 U/L — ABNORMAL HIGH (ref 17–63)
ANION GAP: 12 (ref 5–15)
ANION GAP: 6 (ref 5–15)
AST: 50 U/L — ABNORMAL HIGH (ref 15–41)
AST: 38 U/L (ref 15–41)
Albumin: 3.8 g/dL (ref 3.5–5.0)
Alkaline Phosphatase: 70 U/L (ref 38–126)
BILIRUBIN TOTAL: 0.7 mg/dL (ref 0.3–1.2)
BILIRUBIN TOTAL: 1 mg/dL (ref 0.3–1.2)
BUN: 44 mg/dL — ABNORMAL HIGH (ref 6–20)
BUN: 38 mg/dL — ABNORMAL HIGH (ref 6–20)
CALCIUM: 9.7 mg/dL (ref 8.9–10.3)
CO2: 23 mmol/L (ref 22–32)
CO2: 22 mmol/L (ref 22–32)
Calcium: 8.9 mg/dL (ref 8.9–10.3)
Chloride: 106 mmol/L (ref 101–111)
Chloride: 113 mmol/L — ABNORMAL HIGH (ref 101–111)
Creatinine, Ser: 2.26 mg/dL — ABNORMAL HIGH (ref 0.61–1.24)
Creatinine, Ser: 1.97 mg/dL — ABNORMAL HIGH (ref 0.61–1.24)
GFR calc Af Amer: 34 mL/min — ABNORMAL LOW (ref >60)
GFR calc non Af Amer: 29 mL/min — ABNORMAL LOW (ref >60)
GFR, EST AFRICAN AMERICAN: 29 mL/min — AB (ref >60)
GFR, EST NON AFRICAN AMERICAN: 25 mL/min — AB (ref >60)
GLUCOSE: 93 mg/dL (ref 65–99)
Glucose, Bld: 194 mg/dL — ABNORMAL HIGH (ref 65–99)
POTASSIUM: 4.6 mmol/L (ref 3.5–5.1)
Potassium: 4.4 mmol/L (ref 3.5–5.1)
Sodium: 141 mmol/L (ref 135–145)
Sodium: 141 mmol/L (ref 135–145)
TOTAL PROTEIN: 7.8 g/dL (ref 6.5–8.1)
TOTAL PROTEIN: 6.5 g/dL (ref 6.5–8.1)

## 2017-05-20 LAB — CBC WITH DIFFERENTIAL/PLATELET
BASOS PCT: 0 %
Basophils Absolute: 0 10*3/uL (ref 0.0–0.1)
EOS ABS: 0.2 10*3/uL (ref 0.0–0.7)
EOS PCT: 3 %
HCT: 31.6 % — ABNORMAL LOW (ref 39.0–52.0)
Hemoglobin: 10.4 g/dL — ABNORMAL LOW (ref 13.0–17.0)
Lymphocytes Relative: 15 %
Lymphs Abs: 0.8 10*3/uL (ref 0.7–4.0)
MCH: 32.6 pg (ref 26.0–34.0)
MCHC: 32.9 g/dL (ref 30.0–36.0)
MCV: 99.1 fL (ref 78.0–100.0)
MONOS PCT: 11 %
Monocytes Absolute: 0.6 10*3/uL (ref 0.1–1.0)
Neutro Abs: 3.9 10*3/uL (ref 1.7–7.7)
Neutrophils Relative %: 71 %
PLATELETS: 76 10*3/uL — AB (ref 150–400)
RBC: 3.19 MIL/uL — ABNORMAL LOW (ref 4.22–5.81)
RDW: 15.7 % — AB (ref 11.5–15.5)
WBC: 5.4 10*3/uL (ref 4.0–10.5)

## 2017-05-20 LAB — I-STAT CG4 LACTIC ACID, ED: LACTIC ACID, VENOUS: 3.31 mmol/L — AB (ref 0.5–1.9)

## 2017-05-20 LAB — PROTIME-INR
INR: 1.02
INR: 1.06
PROTHROMBIN TIME: 13.3 s (ref 11.4–15.2)
PROTHROMBIN TIME: 13.7 s (ref 11.4–15.2)

## 2017-05-20 LAB — LACTIC ACID, PLASMA
LACTIC ACID, VENOUS: 1.1 mmol/L (ref 0.5–1.9)
Lactic Acid, Venous: 1.6 mmol/L (ref 0.5–1.9)
Lactic Acid, Venous: 1.5 mmol/L (ref 0.5–1.9)

## 2017-05-20 LAB — CBG MONITORING, ED: GLUCOSE-CAPILLARY: 169 mg/dL — AB (ref 65–99)

## 2017-05-20 LAB — I-STAT TROPONIN, ED: TROPONIN I, POC: 0 ng/mL (ref 0.00–0.08)

## 2017-05-20 LAB — APTT
aPTT: 35 seconds (ref 24–36)
aPTT: 37 seconds — ABNORMAL HIGH (ref 24–36)

## 2017-05-20 LAB — PROCALCITONIN

## 2017-05-20 LAB — MRSA PCR SCREENING: MRSA BY PCR: NEGATIVE

## 2017-05-20 MED ORDER — VANCOMYCIN HCL IN DEXTROSE 1-5 GM/200ML-% IV SOLN: 1000.0000 mg | Freq: Once | INTRAVENOUS | Status: AC

## 2017-05-20 MED ORDER — SODIUM CHLORIDE 0.9 % IV SOLN
INTRAVENOUS | Status: DC
  Administered 2017-05-20 – 2017-05-24 (×5): via INTRAVENOUS

## 2017-05-20 MED ORDER — SENNOSIDES-DOCUSATE SODIUM 8.6-50 MG PO TABS: 1.0000 | ORAL_TABLET | Freq: Every evening | ORAL | Status: DC | PRN

## 2017-05-20 MED ORDER — PIPERACILLIN-TAZOBACTAM 3.375 G IVPB 30 MIN
3.3750 g | Freq: Once | INTRAVENOUS | Status: AC
  Filled 2017-05-20 (×2): qty 50

## 2017-05-20 MED ORDER — ENOXAPARIN SODIUM 40 MG/0.4ML ~~LOC~~ SOLN
40.0000 mg | SUBCUTANEOUS | Status: DC
  Administered 2017-05-20 – 2017-05-22 (×3): 40 mg via SUBCUTANEOUS
  Filled 2017-05-20 (×3): qty 0.4

## 2017-05-20 MED ORDER — ONDANSETRON HCL 4 MG PO TABS: 4.0000 mg | ORAL_TABLET | Freq: Four times a day (QID) | ORAL | Status: DC | PRN

## 2017-05-20 MED ORDER — ACETAMINOPHEN 650 MG RE SUPP: 650.0000 mg | Freq: Four times a day (QID) | RECTAL | Status: DC | PRN

## 2017-05-20 MED ORDER — VANCOMYCIN HCL 10 G IV SOLR
2000.0000 mg | Freq: Once | INTRAVENOUS | Status: AC
  Administered 2017-05-20: 2000 mg via INTRAVENOUS
  Filled 2017-05-20: qty 2000

## 2017-05-20 MED ORDER — PIPERACILLIN-TAZOBACTAM 3.375 G IVPB
3.3750 g | Freq: Three times a day (TID) | INTRAVENOUS | Status: DC
  Administered 2017-05-20 – 2017-05-25 (×13): 3.375 g via INTRAVENOUS
  Filled 2017-05-20 (×13): qty 50

## 2017-05-20 MED ORDER — VANCOMYCIN HCL IN DEXTROSE 1-5 GM/200ML-% IV SOLN: 1000.0000 mg | Freq: Once | INTRAVENOUS | Status: DC

## 2017-05-20 MED ORDER — ONDANSETRON HCL 4 MG/2ML IJ SOLN: 4.0000 mg | Freq: Four times a day (QID) | INTRAMUSCULAR | Status: DC | PRN

## 2017-05-20 MED ORDER — PIPERACILLIN-TAZOBACTAM 3.375 G IVPB 30 MIN
3.3750 g | Freq: Once | INTRAVENOUS | Status: AC
  Administered 2017-05-20: 3.375 g via INTRAVENOUS
  Filled 2017-05-20: qty 50

## 2017-05-20 MED ORDER — ACETAMINOPHEN 325 MG PO TABS
650.0000 mg | ORAL_TABLET | Freq: Four times a day (QID) | ORAL | Status: DC | PRN
  Administered 2017-05-23: 650 mg via ORAL
  Filled 2017-05-20: qty 2

## 2017-05-20 MED ORDER — VANCOMYCIN HCL 10 G IV SOLR
1500.0000 mg | INTRAVENOUS | Status: DC
  Administered 2017-05-22 – 2017-05-24 (×2): 1500 mg via INTRAVENOUS
  Filled 2017-05-20 (×3): qty 1500

## 2017-05-20 MED ORDER — SODIUM CHLORIDE 0.9 % IV BOLUS
2000.0000 mL | Freq: Once | INTRAVENOUS | Status: AC
  Administered 2017-05-20: 2000 mL via INTRAVENOUS

## 2017-05-20 NOTE — ED Provider Notes (Signed)
[facility] EMERGENCY DEPARTMENT Provider Note   CSN: [id number] Arrival date & time: 05/20/17  1148   An emergency department physician performed an initial assessment on this suspected stroke patient at 1203.  History   Chief Complaint Chief Complaint  Patient presents with  . Altered Mental Status    HPI Akira Adelsberger is a 84 y.o. male.  According to the patient's niece the patient was found today very confused and he had torn up his room.  Supposedly last night he was at his normal.  Patient has a history of hypertension diabetes coronary artery disease and has a hydrocele to his scrotum  The history is provided by a relative. No language interpreter was used.  Altered Mental Status   This is a new problem. The current episode started 6 to 12 hours ago. The problem has not changed since onset.Associated symptoms include confusion. Risk factors: Diabetes hypertension. His past medical history does not include seizures.    Past Medical History:  Diagnosis Date  . Acute anterior wall MI ([place]) 2004  . Coronary artery disease   . Diabetes mellitus without complication ([place])   . Hyperlipidemia   . Hypertension     Patient Active Problem List   Diagnosis Date Noted  . Acute metabolic encephalopathy 25/05/3974  . Old myocardial infarction 09/28/2013  . Hypertension 09/28/2013  . Hyperlipidemia LDL goal <70 09/28/2013  . Lower extremity edema 09/28/2013  . CAD (coronary artery disease) 09/28/2013    Past Surgical History:  Procedure Laterality Date  . CORONARY ANGIOPLASTY WITH STENT PLACEMENT  09/05/2002   stent RCA, 80% first diagonal, 70-80% mid-diagonal, 80% mid LAD stenosis  . NM MYOCAR PERF WALL MOTION  11/01/2008   Normal        Home Medications    Prior to Admission medications   Medication Sig Start Date End Date Taking? Authorizing Provider  amLODipine (NORVASC) 10 MG tablet Take 10 mg by mouth daily.   Yes [provider]  amLODipine (NORVASC)  5 MG tablet Take 1 tablet (5 mg total) by mouth daily. 11/22/13  Yes Troy Sine, MD  cholecalciferol (VITAMIN D) 1000 units tablet Take 1,000 Units by mouth daily.   Yes [provider]  furosemide (LASIX) 40 MG tablet Take 1 tablet (40 mg total) by mouth daily. 11/22/13  Yes Troy Sine, MD  meclizine (ANTIVERT) 25 MG tablet Take 25 mg by mouth daily.   Yes [provider]  metoprolol (LOPRESSOR) 50 MG tablet Take 1 tablet (50 mg total) by mouth 2 (two) times daily. **Patient MUST make and appointment for further refills** Patient taking differently: Take 25 mg by mouth 2 (two) times daily. **Patient MUST make and appointment for further refills** 11/22/13  Yes Troy Sine, MD  tamsulosin (FLOMAX) 0.4 MG CAPS capsule Take 0.4 mg by mouth daily.   Yes [provider]  TRAVATAN Z 0.004 % SOLN ophthalmic solution Place 1 drop into both eyes 2 (two) times daily. 04/13/17  Yes [provider]    Family History Family History  Problem Relation Age of Onset  . Hypertension Father   . Diabetes Sister     Social History Social History   Tobacco Use  . Smoking status: Never Smoker  . Smokeless tobacco: Never Used  Substance Use Topics  . Alcohol use: No    Alcohol/week: 0.0 oz  . Drug use: No     Allergies   Patient has no known allergies.   Review of  Systems Review of Systems  Unable to perform ROS: Mental status change  Psychiatric/Behavioral: Positive for confusion.     Physical Exam Updated Vital Signs BP 122/75   Pulse (!) 51   Temp (!) 91.9 F (33.3 C)   Resp 15   Ht 5\' 9"  (1.753 m)   Wt 113.4 kg (250 lb)   SpO2 95%   BMI 36.92 kg/m   Physical Exam  Constitutional: He appears well-developed.  HENT:  Head: Normocephalic.  Oropharynx shows dehydration  Eyes: Conjunctivae and EOM are normal. No scleral icterus.  Neck: Neck supple. No thyromegaly present.  Cardiovascular: Normal rate and regular rhythm. Exam reveals  no gallop and no friction rub.  No murmur heard. Pulmonary/Chest: No stridor. He has no wheezes. He has no rales. He exhibits no tenderness.  Abdominal: He exhibits no distension. There is no tenderness. There is no rebound.  Musculoskeletal: Normal range of motion. He exhibits no edema.  His general weakness to upper and lower extremities  Lymphadenopathy:    He has no cervical adenopathy.  Neurological: He exhibits normal muscle tone. Coordination normal.  Patient alert and only oriented to person, he cannot follow commands either  Skin: No rash noted. No erythema.     ED Treatments / Results  Labs (all labs ordered are listed, but only abnormal results are displayed) Labs Reviewed  CBC - Abnormal; Notable for the following components:      Result Value   RBC 3.55 (*)    Hemoglobin 11.7 (*)    HCT 35.1 (*)    RDW 15.9 (*)    Platelets 76 (*)    All other components within normal limits  COMPREHENSIVE METABOLIC PANEL - Abnormal; Notable for the following components:   Glucose, Bld 194 (*)    BUN 44 (*)    Creatinine, Ser 2.26 (*)    AST 50 (*)    ALT 95 (*)    GFR calc non Af Amer 25 (*)    GFR calc Af Amer 29 (*)    All other components within normal limits  URINALYSIS, ROUTINE W REFLEX MICROSCOPIC - Abnormal; Notable for the following components:   Color, Urine STRAW (*)    Hgb urine dipstick SMALL (*)    Leukocytes, UA TRACE (*)    All other components within normal limits  CBG MONITORING, ED - Abnormal; Notable for the following components:   Glucose-Capillary 169 (*)    All other components within normal limits  I-STAT CHEM 8, ED - Abnormal; Notable for the following components:   BUN 37 (*)    Creatinine, Ser 2.30 (*)    Glucose, Bld 186 (*)    Hemoglobin 12.2 (*)    HCT 36.0 (*)    All other components within normal limits  I-STAT CG4 LACTIC ACID, ED - Abnormal; Notable for the following components:   Lactic Acid, Venous 3.31 (*)    All other components  within normal limits  CULTURE, BLOOD (ROUTINE X 2)  CULTURE, BLOOD (ROUTINE X 2)  URINE CULTURE  PROTIME-INR  APTT  DIFFERENTIAL  I-STAT TROPONIN, ED  I-STAT CG4 LACTIC ACID, ED    EKG EKG Interpretation  Date/Time:  Wednesday May 20 2017 12:23:17 EDT Ventricular Rate:  51 PR Interval:    QRS Duration: 80 QT Interval:  445 QTC Calculation: 410 R Axis:   51 Text Interpretation:  Sinus rhythm Nonspecific repol abnormality, diffuse leads No significant change since last tracing Abnormal ekg Confirmed by Carmin Muskrat [phone])  on 05/20/2017 12:29:43 PM   Radiology Dg Chest Portable 1 View  Result Date: 05/20/2017 CLINICAL DATA:  Sepsis. EXAM: PORTABLE CHEST 1 VIEW COMPARISON:  None. FINDINGS: The cardiac silhouette is borderline to mildly enlarged. Aortic atherosclerosis is noted. The lungs are mildly hypoinflated with mild accentuation of the interstitial markings. No overt pulmonary edema, airspace consolidation, sizeable pleural effusion, or pneumothorax is identified. Multiple metallic BBs are noted in the soft tissues of the left axillary region and upper arm. IMPRESSION: No active disease. Electronically Signed   By: Logan Bores M.D.   On: 05/20/2017 13:31   Ct Head Code Stroke Wo Contrast  Result Date: 05/20/2017 CLINICAL DATA:  Code stroke.  Altered level of consciousness. EXAM: CT HEAD WITHOUT CONTRAST TECHNIQUE: Contiguous axial images were obtained from the base of the skull through the vertex without intravenous contrast. COMPARISON:  None. FINDINGS: Brain: Generalized atrophy without hydrocephalus. Negative for acute infarct, hemorrhage, or mass lesion. No edema or midline shift. Vascular: Atherosclerotic disease.  Negative for hyperdense vessel Skull: Negative Sinuses/Orbits: Chronic sinusitis with mucoperiosteal thickening right greater than left. Bilateral sinus surgery. Bilateral cataract surgery. Other: None ASPECTS ([place] Stroke Program Early CT Score) - Ganglionic  level infarction (caudate, lentiform nuclei, internal capsule, insula, M1-M3 cortex): 7 - Supraganglionic infarction (M4-M6 cortex): 3 Total score (0-10 with 10 being normal): 10 IMPRESSION: 1. No acute abnormality 2. ASPECTS is 10 3. These results were called by telephone at the time of interpretation on 05/20/2017 at 12:16 pm to Dr. Milton Ferguson , who verbally acknowledged these results. Electronically Signed   By: [name] Gallo M.D.   On: 05/20/2017 12:17    Procedures Procedures (including critical care time)  Medications Ordered in ED Medications  vancomycin (VANCOCIN) 2,000 mg in sodium chloride 0.9 % 500 mL IVPB (2,000 mg Intravenous New Bag/Given 05/20/17 1333)  sodium chloride 0.9 % bolus 2,000 mL (2,000 mLs Intravenous New Bag/Given 05/20/17 1322)  piperacillin-tazobactam (ZOSYN) IVPB 3.375 g (0 g Intravenous Stopped 05/20/17 1359)     Initial Impression / Assessment and Plan / ED Course  I have reviewed the triage vital signs and the nursing notes.  Pertinent labs & imaging results that were available during my care of the patient were reviewed by me and considered in my medical decision making (see chart for details).     CRITICAL CARE Performed by: Milton Ferguson Total critical care time: 45 minutes Critical care time was exclusive of separately billable procedures and treating other patients. Critical care was necessary to treat or prevent imminent or life-threatening deterioration. Critical care was time spent personally by me on the following activities: development of treatment plan with patient and/or surrogate as well as nursing, discussions with consultants, evaluation of patient's response to treatment, examination of patient, obtaining history from patient or surrogate, ordering and performing treatments and interventions, ordering and review of laboratory studies, ordering and review of radiographic studies, pulse oximetry and re-evaluation of patient's  condition. Patient with hypothermia.  Possibly septic.  CBC and chemistries unremarkable but lactic acid elevated 3.3 patient has blood cultures urine cultures and antibiotics have been started.  He is given warmed IV fluids and Bair hugger.    Final Clinical Impressions(s) / ED Diagnoses   Final diagnoses:  Hypothermia, initial encounter    ED Discharge Orders    None       Milton Ferguson, MD 05/20/17 1446

## 2017-05-20 NOTE — ED Notes (Signed)
Pt brought back to room from CT . Testing not performed due to elevated Creat

## 2017-05-20 NOTE — ED Notes (Signed)
Dr Jerilee Hoh in to see pt

## 2017-05-20 NOTE — Progress Notes (Signed)
CODE STROKE [place]

## 2017-05-20 NOTE — ED Notes (Signed)
CRITICAL VALUE ALERT  Critical Value:  I-Stat Lactic  Date & Time Notied:  05/20/17 at 1315  Provider Notified: Dr. Roderic Palau  Orders Received/Actions taken: None at this time

## 2017-05-20 NOTE — Consult Note (Signed)
[facility] TeleNeurology Consult Services  Asked to see this patient in telemedicine consultation. Consultation was performed with assistance of ancillary/medical staff at bedside.  Comments: Last Known normal  Last nihgt 2130 Door Time: 1148 [facility] Contacted:  [phone] [facility] first log in: 1225 NIHSS assessment time: 0017 Call back time: 1253 Needle Time: no iv tpa  HPI:  67 yom with hx of htn, hld, diabetes, cad presents with unsteady gait and confusion. He was last seen normal by family around 2130 last night and this am around 530 they noted the change in mentation.  He has no hx of stroke but family states he was scheduled to see urologist for bladder concerns.     VSS  Gen Wn/Wd in Nad  TeleStroke Assessment: LOC:   0 LOC questions:  1 LOC Commands :   0 Gaze : 0 Visual fields :  0  Facial movements : 0 Upper limb Motor  0 Lower limb Motor  1+1 Limb Coordination  - 0 Sensory -  - 0 Language -  0 Speech -   1 Neglect / extinction -  0  NIHSS Score: 4    IMPRESSION  AMS, concern for metabolic encephalopathy, less likely stroke as exam is non localizing.   Medical Decision Making:   Patient is not candidate for alteplase due to non focal / localizing exam and onset greater than 4.5 hrs.  Not an IR candidate as low clinical suspicion for LVO by neurologic assessment.   Recommendations: - Daily antithrombotics to initiate now if no contraindication.  - Further work up infectious and metabolic processes and if negative can consider MRI brain as inpt -  Needs Inpatient Neurology consultation and follow up  - Thank you for allowing [place] to participate in the care of your patient, if there are any questions please don't hesitate to contact us  Discussed plan of care with patient/family/hospital staff   Physician: Sylvan Cheese, DO   [facility]

## 2017-05-20 NOTE — ED Notes (Signed)
Lab in drawing 2nd set of blood culture

## 2017-05-20 NOTE — Progress Notes (Signed)
Pharmacy Antibiotic Note  Terrance Miller is a 84 y.o. male admitted on 05/20/2017 with sepsis.  Pharmacy has been consulted for Vancomycin and zosyn dosing.  Plan: Vancomycin 2000mg  loading dose, then 1500mg  IV IV every 48 hours.  Goal trough 15-20 mcg/mL. Zosyn 3.375g IV q8h (4 hour infusion).  F/U cxs and clinical progress Monitor V/S, labs, and levels as indicated  Height: 5\' 9"  (175.3 cm) Weight: 250 lb (113.4 kg) IBW/kg (Calculated) : 70.7  Temp (24hrs), Avg:93.1 F (33.9 C), Min:91.6 F (33.1 C), Max:97.8 F (36.6 C)  Recent Labs  Lab 05/20/17 1220 05/20/17 1223 05/20/17 1314 05/20/17 1458 05/20/17 1630  WBC 7.4  --   --   --   --   CREATININE 2.26* 2.30*  --   --   --   LATICACIDVEN  --   --  3.31* 1.6 1.5    Estimated Creatinine Clearance: 28.6 mL/min (A) (by C-G formula based on SCr of 2.3 mg/dL (H)).   Normalized CrCl is 32mls/min  No Known Allergies  Antimicrobials this admission: Vancomycin 5/15 >> Zosyn 5/15 >>   Dose adjustments this admission: n/a  Microbiology results: 5/15 BCx: pending 5/15 UCx: pending  Sputum:   MRSA PCR:   Thank you for allowing pharmacy to be a part of this patient's care.  Isac Sarna, BS Pharm D, [place] Clinical Pharmacist Pager [phone]  05/20/2017 5:27 PM

## 2017-05-20 NOTE — ED Notes (Signed)
MD Zammit in triage assessing pt.

## 2017-05-20 NOTE — H&P (Signed)
History and Physical    Karandeep Resende IOE:[id number] DOB: 03-Jan-1929 DOA: 05/20/2017  Referring MD/NP/PA: Milton Ferguson, EDP PCP: Lucia Gaskins, MD  Patient coming from: Home  Chief Complaint: Confusion  HPI: Terrance Miller is a 84 y.o. male with history of diabetes, hyperlipidemia, benign essential hypertension, coronary artery disease and a hydrocele that was recently diagnosed at an ED visit who was brought into the hospital today by his 2 daughters due to confusion.  He still seems altered and is not able to give me history, hence most of the history is given by daughter at bedside.  Daughter states that she last saw him normal around 9:00 the night prior, at around 930 she states that her brother told her that he spoke via phone with the patient and he seemed normal as well.  This morning around 5 AM she heard a large commotion in his bedroom.  When she walked down he was standing in the corner, was very confused, the room was a mess with close turn all over the place and furniture out of place.  She could tell that he was "not himself", states that his eyes were glassy, there was no bowel or bladder incontinence.  She brought him to the hospital for evaluation.  In the ED he was noted to be persistently hypothermic with a temperature rectally of 91, he was otherwise hemodynamically stable.  Lactic acid was 3.3, creatinine was 2.26, admission was requested for further evaluation and management.   Past Medical/Surgical History: Past Medical History:  Diagnosis Date  . Acute anterior wall MI ([place]) 2004  . Coronary artery disease   . Diabetes mellitus without complication ([place])   . Hyperlipidemia   . Hypertension     Past Surgical History:  Procedure Laterality Date  . CORONARY ANGIOPLASTY WITH STENT PLACEMENT  09/05/2002   stent RCA, 80% first diagonal, 70-80% mid-diagonal, 80% mid LAD stenosis  . NM MYOCAR PERF WALL MOTION  11/01/2008   Normal    Social History:  reports that he  has never smoked. He has never used smokeless tobacco. He reports that he does not drink alcohol or use drugs.  Allergies: No Known Allergies  Family History:  Family History  Problem Relation Age of Onset  . Hypertension Father   . Diabetes Sister     Prior to Admission medications   Medication Sig Start Date End Date Taking? Authorizing Provider  amLODipine (NORVASC) 10 MG tablet Take 10 mg by mouth daily.   Yes [provider]  amLODipine (NORVASC) 5 MG tablet Take 1 tablet (5 mg total) by mouth daily. 11/22/13  Yes Troy Sine, MD  cholecalciferol (VITAMIN D) 1000 units tablet Take 1,000 Units by mouth daily.   Yes [provider]  furosemide (LASIX) 40 MG tablet Take 1 tablet (40 mg total) by mouth daily. 11/22/13  Yes Troy Sine, MD  meclizine (ANTIVERT) 25 MG tablet Take 25 mg by mouth daily.   Yes [provider]  metoprolol (LOPRESSOR) 50 MG tablet Take 1 tablet (50 mg total) by mouth 2 (two) times daily. **Patient MUST make and appointment for further refills** Patient taking differently: Take 25 mg by mouth 2 (two) times daily. **Patient MUST make and appointment for further refills** 11/22/13  Yes Troy Sine, MD  tamsulosin (FLOMAX) 0.4 MG CAPS capsule Take 0.4 mg by mouth daily.   Yes [provider]  TRAVATAN Z 0.004 % SOLN ophthalmic solution Place 1 drop into both eyes 2 (two)  times daily. 04/13/17  Yes [provider]    Review of Systems:  Unable to obtain as he is very confused   Physical Exam: Vitals:   05/20/17 1630 05/20/17 1700 05/20/17 1733 05/20/17 1815  BP: 113/60  105/75   Pulse: (!) 59  61   Resp: 15 12 17    Temp: (!) 94.3 F (34.6 C) (!) 94.8 F (34.9 C) (!) 95.5 F (35.3 C) (!) 97.5 F (36.4 C)  TempSrc:   Core Oral  SpO2: 96%  99%   Weight:    102.3 kg (225 lb 8.5 oz)  Height:    6' (1.829 m)     Constitutional: NAD, calm, comfortable Eyes: PERRL, lids and conjunctivae normal ENMT:  Mucous membranes are moist. Posterior pharynx clear of any exudate or lesions. Neck: normal, supple, no masses, no thyromegaly Respiratory: clear to auscultation bilaterally, no wheezing, no crackles. Normal respiratory effort. No accessory muscle use.  Cardiovascular: Regular rate and rhythm, no murmurs / rubs / gallops. No extremity edema. 2+ pedal pulses. No carotid bruits.  Abdomen: no tenderness, no masses palpated. No hepatosplenomegaly. Bowel sounds positive.  Musculoskeletal: no clubbing / cyanosis. No joint deformity upper and lower extremities. Good ROM, no contractures. Normal muscle tone.  Skin: no rashes, lesions, ulcers. No induration Neurologic: He is slow to follow commands, cranial nerves appear grossly intact, he remains confused and spatially unaware,  grip strength appears equal bilaterally Psychiatric: Unable to fully assess given current mental state.    Labs on Admission: I have personally reviewed the following labs and imaging studies  CBC: Recent Labs  Lab 05/20/17 1220 05/20/17 1223  WBC 7.4  --   NEUTROABS 5.8  --   HGB 11.7* 12.2*  HCT 35.1* 36.0*  MCV 98.9  --   PLT 76*  --    Basic Metabolic Panel: Recent Labs  Lab 05/20/17 1220 05/20/17 1223  NA 141 143  K 4.6 4.6  CL 106 110  CO2 23  --   GLUCOSE 194* 186*  BUN 44* 37*  CREATININE 2.26* 2.30*  CALCIUM 9.7  --    GFR: Estimated Creatinine Clearance: 28.5 mL/min (A) (by C-G formula based on SCr of 2.3 mg/dL (H)). Liver Function Tests: Recent Labs  Lab 05/20/17 1220  AST 50*  ALT 95*  ALKPHOS 80  BILITOT 0.7  PROT 7.8  ALBUMIN 3.8   No results for input(s): LIPASE, AMYLASE in the last 168 hours. No results for input(s): AMMONIA in the last 168 hours. Coagulation Profile: Recent Labs  Lab 05/20/17 1220  INR 1.02   Cardiac Enzymes: No results for input(s): CKTOTAL, CKMB, CKMBINDEX, TROPONINI in the last 168 hours. BNP (last 3 results) No results for input(s): PROBNP in the  last 8760 hours. HbA1C: No results for input(s): HGBA1C in the last 72 hours. CBG: Recent Labs  Lab 05/20/17 1203  GLUCAP 169*   Lipid Profile: No results for input(s): CHOL, HDL, LDLCALC, TRIG, CHOLHDL, LDLDIRECT in the last 72 hours. Thyroid Function Tests: No results for input(s): TSH, T4TOTAL, FREET4, T3FREE, THYROIDAB in the last 72 hours. Anemia Panel: No results for input(s): VITAMINB12, FOLATE, FERRITIN, TIBC, IRON, RETICCTPCT in the last 72 hours. Urine analysis:    Component Value Date/Time   COLORURINE STRAW (A) 05/20/2017 1303   APPEARANCEUR CLEAR 05/20/2017 1303   LABSPEC 1.012 05/20/2017 1303   PHURINE 5.0 05/20/2017 1303   GLUCOSEU NEGATIVE 05/20/2017 1303   HGBUR SMALL (A) 05/20/2017 1303   BILIRUBINUR NEGATIVE 05/20/2017 1303  KETONESUR NEGATIVE 05/20/2017 1303   PROTEINUR NEGATIVE 05/20/2017 1303   NITRITE NEGATIVE 05/20/2017 1303   LEUKOCYTESUR TRACE (A) 05/20/2017 1303   Sepsis Labs: @LABRCNTIP (procalcitonin:4,lacticidven:4) ) Recent Results (from the past 240 hour(s))  Blood Culture (routine x 2)     Status: None (Preliminary result)   Collection Time: 05/20/17  1:03 PM  Result Value Ref Range Status   Specimen Description BLOOD LEFT ARM  Final   Special Requests   Final    BOTTLES DRAWN AEROBIC AND ANAEROBIC Blood Culture adequate volume Performed at [facility], [address], [place], [place] [address]    Culture PENDING  Incomplete   Report Status PENDING  Incomplete  Blood Culture (routine x 2)     Status: None (Preliminary result)   Collection Time: 05/20/17  1:31 PM  Result Value Ref Range Status   Specimen Description BLOOD LEFT HAND  Final   Special Requests   Final    BOTTLES DRAWN AEROBIC AND ANAEROBIC Blood Culture adequate volume Performed at [facility], [address], [place], [place] [address]    Culture PENDING  Incomplete   Report Status PENDING  Incomplete     Radiological Exams on Admission: Dg Chest Portable 1  View  Result Date: 05/20/2017 CLINICAL DATA:  Sepsis. EXAM: PORTABLE CHEST 1 VIEW COMPARISON:  None. FINDINGS: The cardiac silhouette is borderline to mildly enlarged. Aortic atherosclerosis is noted. The lungs are mildly hypoinflated with mild accentuation of the interstitial markings. No overt pulmonary edema, airspace consolidation, sizeable pleural effusion, or pneumothorax is identified. Multiple metallic BBs are noted in the soft tissues of the left axillary region and upper arm. IMPRESSION: No active disease. Electronically Signed   By: Logan Bores M.D.   On: 05/20/2017 13:31   Ct Head Code Stroke Wo Contrast  Result Date: 05/20/2017 CLINICAL DATA:  Code stroke.  Altered level of consciousness. EXAM: CT HEAD WITHOUT CONTRAST TECHNIQUE: Contiguous axial images were obtained from the base of the skull through the vertex without intravenous contrast. COMPARISON:  None. FINDINGS: Brain: Generalized atrophy without hydrocephalus. Negative for acute infarct, hemorrhage, or mass lesion. No edema or midline shift. Vascular: Atherosclerotic disease.  Negative for hyperdense vessel Skull: Negative Sinuses/Orbits: Chronic sinusitis with mucoperiosteal thickening right greater than left. Bilateral sinus surgery. Bilateral cataract surgery. Other: None ASPECTS ([place] Stroke Program Early CT Score) - Ganglionic level infarction (caudate, lentiform nuclei, internal capsule, insula, M1-M3 cortex): 7 - Supraganglionic infarction (M4-M6 cortex): 3 Total score (0-10 with 10 being normal): 10 IMPRESSION: 1. No acute abnormality 2. ASPECTS is 10 3. These results were called by telephone at the time of interpretation on 05/20/2017 at 12:16 pm to Dr. Milton Ferguson , who verbally acknowledged these results. Electronically Signed   By: [name] Gallo M.D.   On: 05/20/2017 12:17    EKG: Independently reviewed.  Nonspecific inferolateral ST-T wave changes  Assessment/Plan Principal Problem:   Acute metabolic  encephalopathy Active Problems:   Hypertension   Hyperlipidemia LDL goal <70   CAD (coronary artery disease)   Hypothermia   Severe sepsis (HCC)    Acute metabolic encephalopathy/sepsis -Etiology remains unclear at this time, suspect likely related to possible sepsis, less likely central nervous system disease. -Nonetheless given degree of confusion we will go ahead and request MRI of the brain to rule out acute ischemic injury, CT scan of the head was negative for acute pathology. -No obvious sources of infection in the ED was given sepsis directed fluid boluses, was given broad-spectrum antibiotic  therapy which I will elect to continue pending culture data. -Culture data has been requested. -For hypothermia warming blanket and warm fluids have been ordered. -We will also request EEG to fully rule out seizure activity. -We will keep n.p.o. pending MRI results in case he ends up having acute stroke so that he can be cleared with a swallow screen.  Hypertension -Currently well controlled -Holding oral medications given sepsis and n.p.o. state.  Hyperlipidemia -Holding statin     DVT prophylaxis: Lovenox Code Status: Full code Family Communication: 2 daughters at bedside updated on plan of care and all questions answered Disposition Plan: Discharge home versus SNF pending improvement in clinical condition Consults called: None but consider neurology if mental status fails to improve Admission status: Inpatient   The patient is critically ill with multiple organ systems failure and requires high complexity decision making for assessment and support, frequent evaluation and titration of therapies, application of advanced monitoring technologies and extensive interpretation of multiple databases.  Critical care time - 85 mins.     Lelon Frohlich MD [facility] Pager [phone]  If 7PM-7AM, please contact night-coverage [email or web address] Password  [facility]  05/20/2017, 6:36 PM

## 2017-05-20 NOTE — ED Triage Notes (Signed)
LKW 05/19/17 2130, but had some confusion per family. When family went to go wake him pt was fully dressed and the bedroom was "tore up". Pt can state name and birthday, [place]. No hx of dementia. Family states he began having a harder time urinating and decreased output, with foul odor.

## 2017-05-20 NOTE — ED Notes (Signed)
Scrotum noted to be approx size of grapefruit. Dr Roderic Palau aware

## 2017-05-20 NOTE — ED Notes (Signed)
PT went to CT at this time with triage nurse.

## 2017-05-21 ENCOUNTER — Inpatient Hospital Stay (HOSPITAL_COMMUNITY)
Admit: 2017-05-21 | Discharge: 2017-05-21 | Disposition: A | Discharge status: Home / Self Care | Payer: Medicare HMO | Attending: Internal Medicine | Admitting: Internal Medicine

## 2017-05-21 ENCOUNTER — Inpatient Hospital Stay (HOSPITAL_COMMUNITY): Payer: Medicare HMO

## 2017-05-21 DIAGNOSIS — R410 Disorientation, unspecified: Secondary | ICD-10-CM

## 2017-05-21 LAB — CBC
HCT: 30.6 % — ABNORMAL LOW (ref 39.0–52.0)
HEMATOCRIT: 30.9 % — AB (ref 39.0–52.0)
HEMOGLOBIN: 10 g/dL — AB (ref 13.0–17.0)
HEMOGLOBIN: 10.3 g/dL — AB (ref 13.0–17.0)
MCH: 32.6 pg (ref 26.0–34.0)
MCH: 33.4 pg (ref 26.0–34.0)
MCHC: 32.7 g/dL (ref 30.0–36.0)
MCHC: 33.3 g/dL (ref 30.0–36.0)
MCV: 99.7 fL (ref 78.0–100.0)
MCV: 100.3 fL — ABNORMAL HIGH (ref 78.0–100.0)
Platelets: 72 10*3/uL — ABNORMAL LOW (ref 150–400)
Platelets: 77 10*3/uL — ABNORMAL LOW (ref 150–400)
RBC: 3.07 MIL/uL — AB (ref 4.22–5.81)
RBC: 3.08 MIL/uL — ABNORMAL LOW (ref 4.22–5.81)
RDW: 16.1 % — ABNORMAL HIGH (ref 11.5–15.5)
RDW: 16.2 % — ABNORMAL HIGH (ref 11.5–15.5)
WBC: 5.5 10*3/uL (ref 4.0–10.5)
WBC: 5.4 10*3/uL (ref 4.0–10.5)

## 2017-05-21 LAB — COMPREHENSIVE METABOLIC PANEL
ALBUMIN: 2.9 g/dL — AB (ref 3.5–5.0)
ALK PHOS: 65 U/L (ref 38–126)
ALT: 69 U/L — AB (ref 17–63)
ANION GAP: 7 (ref 5–15)
AST: 34 U/L (ref 15–41)
BUN: 38 mg/dL — ABNORMAL HIGH (ref 6–20)
CALCIUM: 8.6 mg/dL — AB (ref 8.9–10.3)
CHLORIDE: 115 mmol/L — AB (ref 101–111)
CO2: 21 mmol/L — ABNORMAL LOW (ref 22–32)
CREATININE: 2.15 mg/dL — AB (ref 0.61–1.24)
GFR calc non Af Amer: 26 mL/min — ABNORMAL LOW (ref >60)
GFR, EST AFRICAN AMERICAN: 30 mL/min — AB (ref >60)
GLUCOSE: 69 mg/dL (ref 65–99)
Potassium: 4.5 mmol/L (ref 3.5–5.1)
Sodium: 143 mmol/L (ref 135–145)
Total Bilirubin: 1.5 mg/dL — ABNORMAL HIGH (ref 0.3–1.2)
Total Protein: 6 g/dL — ABNORMAL LOW (ref 6.5–8.1)

## 2017-05-21 LAB — FOLATE: FOLATE: 10.6 ng/mL (ref >5.9)

## 2017-05-21 LAB — LACTIC ACID, PLASMA: Lactic Acid, Venous: 1.2 mmol/L (ref 0.5–1.9)

## 2017-05-21 LAB — PROCALCITONIN

## 2017-05-21 LAB — AMMONIA: AMMONIA: 36 umol/L — AB (ref 9–35)

## 2017-05-21 LAB — HEPATIC FUNCTION PANEL
ALT: 65 U/L — ABNORMAL HIGH (ref 17–63)
AST: 36 U/L (ref 15–41)
Albumin: 3.1 g/dL — ABNORMAL LOW (ref 3.5–5.0)
Alkaline Phosphatase: 65 U/L (ref 38–126)
BILIRUBIN DIRECT: 0.2 mg/dL (ref 0.1–0.5)
Indirect Bilirubin: 1.5 mg/dL — ABNORMAL HIGH (ref 0.3–0.9)
Total Bilirubin: 1.7 mg/dL — ABNORMAL HIGH (ref 0.3–1.2)
Total Protein: 6.4 g/dL — ABNORMAL LOW (ref 6.5–8.1)

## 2017-05-21 LAB — VITAMIN B12: Vitamin B-12: 452 pg/mL (ref 180–914)

## 2017-05-21 LAB — SEDIMENTATION RATE: SED RATE: 59 mm/h — AB (ref 0–16)

## 2017-05-21 LAB — TSH: TSH: 2.439 u[IU]/mL (ref 0.350–4.500)

## 2017-05-21 NOTE — Procedures (Signed)
ELECTROENCEPHALOGRAM REPORT  Date of Study: 05/21/2017  Patient's Name: Terrance Miller MRN: [id number] Date of Birth: 1929-01-03  Referring Provider: Lelon Frohlich, MD  Clinical History: 84 y.o. male with history of diabetes, hyperlipidemia, benign essential hypertension, coronary artery disease and a hydrocele presents with confusion  Medications: Zosyn Vancomycin Zofran Lovenox Tylenol  Technical Summary: A multichannel digital EEG recording measured by the international 10-20 system with electrodes applied with paste and impedances below 5000 ohms performed as portable with EKG monitoring in an awake and drowsy patient.  Hyperventilation and photic stimulation were not performed.  The digital EEG was referentially recorded, reformatted, and digitally filtered in a variety of bipolar and referential montages for optimal display.   Description: The patient is awake and drowsy during the recording.  During maximal wakefulness, there is a symmetric, medium voltage 6-7 Hz posterior dominant rhythm that attenuates with eye opening. This is admixed with  diffuse 4-5 Hz theta and mild 2-3 Hz delta slowing of the waking background.  Stage 2 sleep is not seen.  There were no epileptiform discharges or electrographic seizures seen.    EKG lead was unremarkable.  Impression: This awake and drowsy EEG is abnormal due to diffuse slowing of the waking background.  Clinical Correlation of the above findings indicates diffuse cerebral dysfunction that is non-specific in etiology and can be seen with hypoxic/ischemic injury, toxic/metabolic encephalopathies, neurodegenerative disorders, or medication effect.  The absence of epileptiform discharges does not rule out a clinical diagnosis of epilepsy.  Clinical correlation is advised.   Metta Clines, DO

## 2017-05-21 NOTE — Progress Notes (Signed)
EEG completed; results pending.    

## 2017-05-21 NOTE — Progress Notes (Signed)
Cruciate admission notes and EEG results which just showed diffuse slowing no elective form activity dementia panel ordered as well as serum ammonia and hepatic panel patient currently on antibiotics empirically he is alert and oriented today Jerett Odonohue DXI:[id number] DOB: April 19, 1928 DOA: 05/20/2017 PCP: Lucia Gaskins, MD   Physical Exam: Blood pressure 127/73, pulse (!) 57, temperature 97.7 F (36.5 C), resp. rate 14, height 6' (1.829 m), weight 99.4 kg (219 lb 2.2 oz), SpO2 99 %.  Lungs show prolonged expiratory phase no rales wheezes rhonchi appreciable heart regular rhythm no S3-S4 no heaves thrills rubs abdomen soft nontender bowel sounds normoactive   Investigations:  Recent Results (from the past 240 hour(s))  Blood Culture (routine x 2)     Status: None (Preliminary result)   Collection Time: 05/20/17  1:03 PM  Result Value Ref Range Status   Specimen Description BLOOD LEFT ARM  Final   Special Requests   Final    BOTTLES DRAWN AEROBIC AND ANAEROBIC Blood Culture adequate volume   Culture   Final    NO GROWTH < 24 HOURS Performed at [facility], [address], [place], [place] [address]    Report Status PENDING  Incomplete  Blood Culture (routine x 2)     Status: None (Preliminary result)   Collection Time: 05/20/17  1:31 PM  Result Value Ref Range Status   Specimen Description BLOOD LEFT HAND  Final   Special Requests   Final    BOTTLES DRAWN AEROBIC AND ANAEROBIC Blood Culture adequate volume   Culture   Final    NO GROWTH < 24 HOURS Performed at [facility], [address], [place], [place] [address]    Report Status PENDING  Incomplete  MRSA PCR Screening     Status: None   Collection Time: 05/20/17  6:05 PM  Result Value Ref Range Status   MRSA by PCR NEGATIVE NEGATIVE Final    Comment:        The GeneXpert MRSA Assay (FDA approved for NASAL specimens only), is one component of a comprehensive MRSA colonization surveillance program. It is not intended to  diagnose MRSA infection nor to guide or monitor treatment for MRSA infections. Performed at [facility], [address], [place],  [address]      Basic Metabolic Panel: Recent Labs    05/20/17 2048 05/21/17 0400  NA 141 143  K 4.4 4.5  CL 113* 115*  CO2 22 21*  GLUCOSE 93 69  BUN 38* 38*  CREATININE 1.97* 2.15*  CALCIUM 8.9 8.6*   Liver Function Tests: Recent Labs    05/20/17 2048 05/21/17 0400  AST 38 34  ALT 80* 69*  ALKPHOS 70 65  BILITOT 1.0 1.5*  PROT 6.5 6.0*  ALBUMIN 3.2* 2.9*     CBC: Recent Labs    05/20/17 1220  05/20/17 2048 05/21/17 0400  WBC 7.4  --  5.4 5.5  NEUTROABS 5.8  --  3.9  --   HGB 11.7*   < > 10.4* 10.0*  HCT 35.1*   < > 31.6* 30.6*  MCV 98.9  --  99.1 99.7  PLT 76*  --  76* 72*   < > = values in this interval not displayed.    Mr Brain Wo Contrast  Result Date: 05/20/2017 CLINICAL DATA:  Altered mental status, last seen normal last night. History of hypertension, hyperlipidemia and diabetes. EXAM: MRI HEAD WITHOUT CONTRAST TECHNIQUE: Multiplanar, multiecho pulse sequences of the brain and surrounding structures were obtained  without intravenous contrast. COMPARISON:  CT HEAD May 20, 2017 FINDINGS: Multiple sequences are mildly motion degraded. INTRACRANIAL CONTENTS: No reduced diffusion to suggest acute ischemia, hypercellular tumor or typical infection. No susceptibility artifact to suggest hemorrhage. The ventricles and sulci are normal for patient's age. Minimal supratentorial and pontine white matter FLAIR T2 hyperintensities compatible chronic small vessel ischemic disease, less than expected for age. No suspicious parenchymal signal, masses, mass effect. No abnormal extra-axial fluid collections. No extra-axial masses. VASCULAR: Normal major intracranial vascular flow voids present at skull base. SKULL AND UPPER CERVICAL SPINE: No abnormal sellar expansion. No suspicious calvarial bone marrow signal. Craniocervical junction  maintained. SINUSES/ORBITS: Chronic RIGHT maxillary sinusitis. Mastoid air cells are well aerated.The included ocular globes and orbital contents are non-suspicious. Status post bilateral ocular lens implants. OTHER: None. IMPRESSION: 1. Mild motion degraded examination. 2. Negative noncontrast MRI of the head for age. Electronically Signed   By: Elon Alas M.D.   On: 05/20/2017 19:26   Dg Chest Port 1 View  Result Date: 05/21/2017 CLINICAL DATA:  Acute onset of sepsis. Confusion. EXAM: PORTABLE CHEST 1 VIEW COMPARISON:  Chest radiograph performed 05/20/2017 FINDINGS: The lungs are well-aerated. Vascular congestion is noted. Increased interstitial markings raise concern for mild interstitial edema, though mild right basilar pneumonia cannot be entirely excluded, given the patient's presentation. There is no evidence of significant pleural effusion or pneumothorax. The cardiomediastinal silhouette is borderline normal in size. No acute osseous abnormalities are seen. IMPRESSION: Vascular congestion noted. Increased interstitial markings raise concern for mild interstitial edema, though mild right basilar pneumonia cannot be entirely excluded, given the patient's presentation. Electronically Signed   By: Garald Balding M.D.   On: 05/21/2017 05:35   Dg Chest Portable 1 View  Result Date: 05/20/2017 CLINICAL DATA:  Sepsis. EXAM: PORTABLE CHEST 1 VIEW COMPARISON:  None. FINDINGS: The cardiac silhouette is borderline to mildly enlarged. Aortic atherosclerosis is noted. The lungs are mildly hypoinflated with mild accentuation of the interstitial markings. No overt pulmonary edema, airspace consolidation, sizeable pleural effusion, or pneumothorax is identified. Multiple metallic BBs are noted in the soft tissues of the left axillary region and upper arm. IMPRESSION: No active disease. Electronically Signed   By: Logan Bores M.D.   On: 05/20/2017 13:31   Ct Head Code Stroke Wo Contrast  Result Date:  05/20/2017 CLINICAL DATA:  Code stroke.  Altered level of consciousness. EXAM: CT HEAD WITHOUT CONTRAST TECHNIQUE: Contiguous axial images were obtained from the base of the skull through the vertex without intravenous contrast. COMPARISON:  None. FINDINGS: Brain: Generalized atrophy without hydrocephalus. Negative for acute infarct, hemorrhage, or mass lesion. No edema or midline shift. Vascular: Atherosclerotic disease.  Negative for hyperdense vessel Skull: Negative Sinuses/Orbits: Chronic sinusitis with mucoperiosteal thickening right greater than left. Bilateral sinus surgery. Bilateral cataract surgery. Other: None ASPECTS ([place] Stroke Program Early CT Score) - Ganglionic level infarction (caudate, lentiform nuclei, internal capsule, insula, M1-M3 cortex): 7 - Supraganglionic infarction (M4-M6 cortex): 3 Total score (0-10 with 10 being normal): 10 IMPRESSION: 1. No acute abnormality 2. ASPECTS is 10 3. These results were called by telephone at the time of interpretation on 05/20/2017 at 12:16 pm to Dr. Milton Ferguson , who verbally acknowledged these results. Electronically Signed   By: [name] Gallo M.D.   On: 05/20/2017 12:17      Medications:   Impression:  Principal Problem:   Acute metabolic encephalopathy Active Problems:   Hypertension   Hyperlipidemia LDL goal <70   CAD (coronary  artery disease)   Hypothermia   Severe sepsis [facility])     Plan: Patient has an outpatient appointment for May 21 for hydrocele which will be his first visit.  Dementia panel ordered serum ammonia level ordered hepatic profile ordered we will obtain physical therapy for strengthening and ambulation  Consultants:    Procedures EEG   Antibiotics:            Time spent: 30 minutes   LOS: 1 day   Kallan Bischoff M   05/21/2017, 1:03 PM

## 2017-05-22 LAB — BASIC METABOLIC PANEL
Anion gap: 6 (ref 5–15)
BUN: 33 mg/dL — ABNORMAL HIGH (ref 6–20)
CHLORIDE: 113 mmol/L — AB (ref 101–111)
CO2: 21 mmol/L — AB (ref 22–32)
CREATININE: 2.39 mg/dL — AB (ref 0.61–1.24)
Calcium: 8.6 mg/dL — ABNORMAL LOW (ref 8.9–10.3)
GFR calc non Af Amer: 23 mL/min — ABNORMAL LOW (ref >60)
GFR, EST AFRICAN AMERICAN: 27 mL/min — AB (ref >60)
Glucose, Bld: 155 mg/dL — ABNORMAL HIGH (ref 65–99)
POTASSIUM: 4.5 mmol/L (ref 3.5–5.1)
Sodium: 140 mmol/L (ref 135–145)

## 2017-05-22 LAB — URINE CULTURE: Culture: NO GROWTH

## 2017-05-22 LAB — PROCALCITONIN: Procalcitonin: <0.1 ng/mL

## 2017-05-22 MED ORDER — TAMSULOSIN HCL 0.4 MG PO CAPS
0.4000 mg | ORAL_CAPSULE | Freq: Every day | ORAL | Status: DC
  Administered 2017-05-22 – 2017-05-25 (×4): 0.4 mg via ORAL
  Filled 2017-05-22 (×4): qty 1

## 2017-05-22 NOTE — Care Management Note (Signed)
Case Management Note  Patient Details  Name: Terrance Miller MRN: [id number] Date of Birth: [birth year]  Subjective/Objective: Adm with acute encephalopathy. From home with family. Ind. Recommended for HH PT and RW. Discussed with patient and daughter. Both agreeable. No preference on HH/DME agency.                   Daughter would also like a Wheel chair.   Action/Plan: Juliann Pulse of [facility] notified and will obtain orders via EPic. Will deliver DME to patient's room.    Expected Discharge Date:    05/24/2017             Expected Discharge Plan:  [place]  In-House Referral:     Discharge planning Services  CM Consult  Post Acute Care Choice:  Home Health, Durable Medical Equipment Choice offered to:  Patient  DME Arranged:  Wheelchair manual, Walker rolling DME Agency:  [place]:  PT [place]:  [place]  Status of Service:  Completed, signed off  If discussed at [place] of Stay Meetings, dates discussed:    Additional Comments:  Terrance Miller, [name], RN 05/22/2017, 3:13 PM

## 2017-05-22 NOTE — Progress Notes (Signed)
Patient continues on antibiotics for infiltrate per chest x-ray and states he cannot urinate freely we will add Flomax today.  Continue physical therapy for strengthening and ambulation dynamically stable sinus rhythm we will transfer to telemetry patient sitting in chair looks much improved Deyon Chizek FUX:[id number] DOB: 12-05-1928 DOA: 05/20/2017 PCP: Lucia Gaskins, MD   Physical Exam: Blood pressure (!) 150/93, pulse 68, temperature (!) 96.4 F (35.8 C), resp. rate (!) 22, height 6' (1.829 m), weight 99.5 kg (219 lb 5.7 oz), SpO2 100 %.  Lungs diminished breath sounds in the bases no rales wheeze or rhonchi appreciable heart regular rhythm no S3-S4 no heaves thrills or rubs   Investigations:  Recent Results (from the past 240 hour(s))  Blood Culture (routine x 2)     Status: None (Preliminary result)   Collection Time: 05/20/17  1:03 PM  Result Value Ref Range Status   Specimen Description BLOOD LEFT ARM  Final   Special Requests   Final    BOTTLES DRAWN AEROBIC AND ANAEROBIC Blood Culture adequate volume   Culture   Final    NO GROWTH 2 DAYS Performed at [facility], [address] [place], [place] [address]    Report Status PENDING  Incomplete  Urine Culture     Status: None   Collection Time: 05/20/17  1:04 PM  Result Value Ref Range Status   Specimen Description   Final    URINE, RANDOM Performed at [facility], [address], [place], [place] [address]    Special Requests   Final    NONE Performed at [facility], [address] [place], [place] [address]    Culture   Final    NO GROWTH Performed at [facility], [place] [address], [place], [place] [address]    Report Status 05/22/2017 FINAL  Final  Blood Culture (routine x 2)     Status: None (Preliminary result)   Collection Time: 05/20/17  1:31 PM  Result Value Ref Range Status   Specimen Description BLOOD LEFT HAND  Final   Special Requests   Final    BOTTLES DRAWN AEROBIC AND ANAEROBIC Blood Culture  adequate volume   Culture   Final    NO GROWTH 2 DAYS Performed at [facility], [address] [place], [place] [address]    Report Status PENDING  Incomplete  MRSA PCR Screening     Status: None   Collection Time: 05/20/17  6:05 PM  Result Value Ref Range Status   MRSA by PCR NEGATIVE NEGATIVE Final    Comment:        The GeneXpert MRSA Assay (FDA approved for NASAL specimens only), is one component of a comprehensive MRSA colonization surveillance program. It is not intended to diagnose MRSA infection nor to guide or monitor treatment for MRSA infections. Performed at [facility], [address] [place],  [address]      Basic Metabolic Panel: Recent Labs    05/20/17 2048 05/21/17 0400  NA 141 143  K 4.4 4.5  CL 113* 115*  CO2 22 21*  GLUCOSE 93 69  BUN 38* 38*  CREATININE 1.97* 2.15*  CALCIUM 8.9 8.6*   Liver Function Tests: Recent Labs    05/21/17 0400 05/21/17 1418  AST 34 36  ALT 69* 65*  ALKPHOS 65 65  BILITOT 1.5* 1.7*  PROT 6.0* 6.4*  ALBUMIN 2.9* 3.1*     CBC: Recent Labs    05/20/17 1220  05/20/17 2048 05/21/17 0400 05/21/17  1418  WBC 7.4  --  5.4 5.5 5.4  NEUTROABS 5.8  --  3.9  --   --   HGB 11.7*   < > 10.4* 10.0* 10.3*  HCT 35.1*   < > 31.6* 30.6* 30.9*  MCV 98.9  --  99.1 99.7 100.3*  PLT 76*  --  76* 72* 77*   < > = values in this interval not displayed.    Mr Brain Wo Contrast  Result Date: 05/20/2017 CLINICAL DATA:  Altered mental status, last seen normal last night. History of hypertension, hyperlipidemia and diabetes. EXAM: MRI HEAD WITHOUT CONTRAST TECHNIQUE: Multiplanar, multiecho pulse sequences of the brain and surrounding structures were obtained without intravenous contrast. COMPARISON:  CT HEAD May 20, 2017 FINDINGS: Multiple sequences are mildly motion degraded. INTRACRANIAL CONTENTS: No reduced diffusion to suggest acute ischemia, hypercellular tumor or typical infection. No susceptibility artifact to suggest  hemorrhage. The ventricles and sulci are normal for patient's age. Minimal supratentorial and pontine white matter FLAIR T2 hyperintensities compatible chronic small vessel ischemic disease, less than expected for age. No suspicious parenchymal signal, masses, mass effect. No abnormal extra-axial fluid collections. No extra-axial masses. VASCULAR: Normal major intracranial vascular flow voids present at skull base. SKULL AND UPPER CERVICAL SPINE: No abnormal sellar expansion. No suspicious calvarial bone marrow signal. Craniocervical junction maintained. SINUSES/ORBITS: Chronic RIGHT maxillary sinusitis. Mastoid air cells are well aerated.The included ocular globes and orbital contents are non-suspicious. Status post bilateral ocular lens implants. OTHER: None. IMPRESSION: 1. Mild motion degraded examination. 2. Negative noncontrast MRI of the head for age. Electronically Signed   By: Elon Alas M.D.   On: 05/20/2017 19:26   Dg Chest Port 1 View  Result Date: 05/21/2017 CLINICAL DATA:  Acute onset of sepsis. Confusion. EXAM: PORTABLE CHEST 1 VIEW COMPARISON:  Chest radiograph performed 05/20/2017 FINDINGS: The lungs are well-aerated. Vascular congestion is noted. Increased interstitial markings raise concern for mild interstitial edema, though mild right basilar pneumonia cannot be entirely excluded, given the patient's presentation. There is no evidence of significant pleural effusion or pneumothorax. The cardiomediastinal silhouette is borderline normal in size. No acute osseous abnormalities are seen. IMPRESSION: Vascular congestion noted. Increased interstitial markings raise concern for mild interstitial edema, though mild right basilar pneumonia cannot be entirely excluded, given the patient's presentation. Electronically Signed   By: Garald Balding M.D.   On: 05/21/2017 05:35   Dg Chest Portable 1 View  Result Date: 05/20/2017 CLINICAL DATA:  Sepsis. EXAM: PORTABLE CHEST 1 VIEW COMPARISON:   None. FINDINGS: The cardiac silhouette is borderline to mildly enlarged. Aortic atherosclerosis is noted. The lungs are mildly hypoinflated with mild accentuation of the interstitial markings. No overt pulmonary edema, airspace consolidation, sizeable pleural effusion, or pneumothorax is identified. Multiple metallic BBs are noted in the soft tissues of the left axillary region and upper arm. IMPRESSION: No active disease. Electronically Signed   By: Logan Bores M.D.   On: 05/20/2017 13:31      Medications:   Impression:  Principal Problem:   Acute metabolic encephalopathy Active Problems:   Hypertension   Hyperlipidemia LDL goal <70   CAD (coronary artery disease)   Hypothermia   Severe sepsis (HCC)     Plan: Physical therapy for strengthening and ambulation transfer to telemetry add Flomax 0.4 mg p.o. daily for possible bladder outlet obstruction secondary to BPH  Consultants:    Procedures   Antibiotics: Vancomycin and Zosyn         Time spent: 30  minutes   LOS: 2 days   Harald Quevedo M   05/22/2017, 12:59 PM

## 2017-05-22 NOTE — Plan of Care (Signed)
  Problem: Acute Rehab PT Goals(only PT should resolve) Goal: Pt Will Go Supine/Side To Sit Outcome: Progressing Flowsheets (Taken 05/22/2017 1353) Pt will go Supine/Side to Sit: with supervision Goal: Patient Will Transfer Sit To/From Stand Outcome: Progressing Flowsheets (Taken 05/22/2017 1353) Patient will transfer sit to/from stand: with supervision Goal: Pt Will Transfer Bed To Chair/Chair To Bed Outcome: Progressing Flowsheets (Taken 05/22/2017 1353) Pt will Transfer Bed to Chair/Chair to Bed: with supervision Goal: Pt Will Ambulate Outcome: Progressing Flowsheets (Taken 05/22/2017 1353) Pt will Ambulate: 50 feet;with supervision;with rolling walker  1:53 PM, 05/22/17 Lonell Grandchild, MPT Physical Therapist with [facility] [phone] office [phone] mobile phone

## 2017-05-22 NOTE — Care Management Important Message (Signed)
Important Message  Patient Details  Name: Terrance Miller MRN: [id number] Date of Birth: 01-Apr-1928   Medicare Important Message Given:  Yes    Shelda Altes 05/22/2017, 11:15 AM

## 2017-05-22 NOTE — Evaluation (Signed)
Physical Therapy Evaluation Patient Details Name: Terrance Miller MRN: [id number] DOB: 03/17/1928 Today's Date: 05/22/2017   History of Present Illness  Terrance Miller is a 84 y.o. male with history of diabetes, hyperlipidemia, benign essential hypertension, coronary artery disease and a hydrocele that was recently diagnosed at an ED visit who was brought into the hospital today by his 2 daughters due to confusion.  He still seems altered and is not able to give me history, hence most of the history is given by daughter at bedside.  Daughter states that she last saw him normal around 9:00 the night prior, at around 930 she states that her brother told her that he spoke via phone with the patient and he seemed normal as well.  This morning around 5 AM she heard a large commotion in his bedroom.  When she walked down he was standing in the corner, was very confused, the room was a mess with close turn all over the place and furniture out of place.  She could tell that he was "not himself", states that his eyes were glassy, there was no bowel or bladder incontinence.  She brought him to the hospital for evaluation.  In the ED he was noted to be persistently hypothermic with a temperature rectally of 91, he was otherwise hemodynamically stable.  Lactic acid was 3.3, creatinine was 2.26, admission was requested for further evaluation and management.    Clinical Impression  Patient unsteady on feet during sit to stands due to weakness, tendency to lean on nearby objects for support, required use of RW for walking in room and hallway, limited secondary to fatigue, transferred to commode for a BM and later tolerated sitting up in chair after therapy - RN notified.  Patient will benefit from continued physical therapy in hospital and recommended venue below to increase strength, balance, endurance for safe ADLs and gait.    Follow Up Recommendations Home health PT;Supervision for mobility/OOB    Equipment  Recommendations  Rolling walker with 5" wheels    Recommendations for Other Services       Precautions / Restrictions Precautions Precautions: Fall Restrictions Weight Bearing Restrictions: No      Mobility  Bed Mobility Overal bed mobility: Needs Assistance Bed Mobility: Supine to Sit     Supine to sit: Min guard     General bed mobility comments: labored movement  Transfers Overall transfer level: Needs assistance Equipment used: Rolling walker (2 wheeled);None Transfers: Sit to/from [employer] to Stand: Min guard Stand pivot transfers: Min guard       General transfer comment: unsteady when not using AD, safer using RW  Ambulation/Gait Ambulation/Gait assistance: Min guard Ambulation Distance (Feet): 35 Feet Assistive device: Rolling walker (2 wheeled) Gait Pattern/deviations: Decreased step length - right;Decreased step length - left;Decreased stride length Gait velocity: slow   General Gait Details: very unsteady with tendency to lean on nearby objects for support without AD, required use of RW, had difficulty making turns, limited secondary to c/o fatigue  Stairs            Wheelchair Mobility    Modified Rankin (Stroke Patients Only)       Balance Overall balance assessment: Needs assistance Sitting-balance support: Feet supported;No upper extremity supported Sitting balance-Leahy Scale: Good     Standing balance support: No upper extremity supported;During functional activity Standing balance-Leahy Scale: Poor Standing balance comment: fair/poor without AD, fair using RW  Pertinent Vitals/Pain Pain Assessment: 0-10 Pain Score: 1  Pain Location: left shoulder Pain Descriptors / Indicators: Discomfort Pain Intervention(s): Limited activity within patient's tolerance;Monitored during session    Home Living Family/patient expects to be discharged to:: Private residence Living  Arrangements: Other relatives;Other (Comment) Available Help at Discharge: Family Type of Home: House Home Access: Level entry     Home Layout: One level Home Equipment: Cane - single point Additional Comments: all information per patient    Prior Function Level of Independence: Independent         Comments: household ambulator without AD per patient     Hand Dominance        Extremity/Trunk Assessment   Upper Extremity Assessment Upper Extremity Assessment: Generalized weakness    Lower Extremity Assessment Lower Extremity Assessment: Generalized weakness    Cervical / Trunk Assessment Cervical / Trunk Assessment: Normal  Communication   Communication: No difficulties  Cognition Arousal/Alertness: Awake/alert Behavior During Therapy: WFL for tasks assessed/performed Overall Cognitive Status: Within Functional Limits for tasks assessed                                        General Comments      Exercises     Assessment/Plan    PT Assessment Patient needs continued PT services  PT Problem List Decreased strength;Decreased activity tolerance;Decreased balance;Decreased mobility       PT Treatment Interventions Gait training;Functional mobility training;Therapeutic activities;Therapeutic exercise;Patient/family education;Stair training    PT Goals (Current goals can be found in the Care Plan section)  Acute Rehab PT Goals Patient Stated Goal: return home with family to assist PT Goal Formulation: With patient Time For Goal Achievement: 05/29/17 Potential to Achieve Goals: Good    Frequency Min 3X/week   Barriers to discharge        Co-evaluation               AM-PAC PT "6 Clicks" Daily Activity  Outcome Measure Difficulty turning over in bed (including adjusting bedclothes, sheets and blankets)?: A Little Difficulty moving from lying on back to sitting on the side of the bed? : A Little Difficulty sitting down on and  standing up from a chair with arms (e.g., wheelchair, bedside commode, etc,.)?: A Little Help needed moving to and from a bed to chair (including a wheelchair)?: A Little Help needed walking in hospital room?: A Little Help needed climbing 3-5 steps with a railing? : A Lot 6 Click Score: 17    End of Session   Activity Tolerance: Patient tolerated treatment well;Patient limited by fatigue Patient left: in chair;with call bell/phone within reach Nurse Communication: Mobility status;Other (comment)(RN/NT notified patient left up in chair) PT Visit Diagnosis: Unsteadiness on feet (R26.81);Muscle weakness (generalized) (M62.81)    Time: 6546-5035 PT Time Calculation (min) (ACUTE ONLY): 37 min   Charges:   PT Evaluation $PT Eval Moderate Complexity: 1 Mod PT Treatments $Therapeutic Activity: 23-37 mins   PT G Codes:        1:51 PM, 2017/05/24 Lonell Grandchild, MPT Physical Therapist with [facility] [phone] office [phone] mobile phone

## 2017-05-23 LAB — HIV ANTIBODY (ROUTINE TESTING W REFLEX): HIV SCREEN 4TH GENERATION: NONREACTIVE

## 2017-05-23 MED ORDER — ENOXAPARIN SODIUM 30 MG/0.3ML ~~LOC~~ SOLN
30.0000 mg | SUBCUTANEOUS | Status: DC
  Administered 2017-05-23 – 2017-05-24 (×2): 30 mg via SUBCUTANEOUS
  Filled 2017-05-23 (×2): qty 0.3

## 2017-05-23 MED ORDER — AMLODIPINE BESYLATE 5 MG PO TABS
5.0000 mg | ORAL_TABLET | Freq: Every day | ORAL | Status: DC
  Administered 2017-05-23 – 2017-05-25 (×3): 5 mg via ORAL
  Filled 2017-05-23 (×3): qty 1

## 2017-05-23 NOTE — Progress Notes (Signed)
Patient continues on dual antibiotics sitting up much more alert renal function somewhat worse monitor daily x3 likewise will have physical therapy and rolling walker for strengthening and ambulation as well as home physical therapy Baird Polinski EXN:[id number] DOB: [birth year] DOA: 05/20/2017 PCP: Lucia Gaskins, MD   Physical Exam: Blood pressure (!) 170/78, pulse (!) 55, temperature 98 F (36.7 C), temperature source Oral, resp. rate 16, height 6' (1.829 m), weight 99.5 kg (219 lb 5.7 oz), SpO2 100 %.  Lungs show prolonged expiratory phase mild end expiratory wheeze no rales no rhonchi appreciable heart regular rhythm no S3-S4 no heaves thrills or rubs   Investigations:  Recent Results (from the past 240 hour(s))  Blood Culture (routine x 2)     Status: None (Preliminary result)   Collection Time: 05/20/17  1:03 PM  Result Value Ref Range Status   Specimen Description BLOOD LEFT ARM  Final   Special Requests   Final    BOTTLES DRAWN AEROBIC AND ANAEROBIC Blood Culture adequate volume   Culture   Final    NO GROWTH 3 DAYS Performed at [facility], [address], [place], [place] [address]    Report Status PENDING  Incomplete  Urine Culture     Status: None   Collection Time: 05/20/17  1:04 PM  Result Value Ref Range Status   Specimen Description   Final    URINE, RANDOM Performed at [facility], [address], [place], [place] [address]    Special Requests   Final    NONE Performed at [facility], [address], [place], [place] [address]    Culture   Final    NO GROWTH Performed at [facility], [place] [address], [place], [place] [address]    Report Status 05/22/2017 FINAL  Final  Blood Culture (routine x 2)     Status: None (Preliminary result)   Collection Time: 05/20/17  1:31 PM  Result Value Ref Range Status   Specimen Description BLOOD LEFT HAND  Final   Special Requests   Final    BOTTLES DRAWN AEROBIC AND ANAEROBIC Blood Culture adequate volume   Culture    Final    NO GROWTH 3 DAYS Performed at [facility], [address], [place], [place] [address]    Report Status PENDING  Incomplete  MRSA PCR Screening     Status: None   Collection Time: 05/20/17  6:05 PM  Result Value Ref Range Status   MRSA by PCR NEGATIVE NEGATIVE Final    Comment:        The GeneXpert MRSA Assay (FDA approved for NASAL specimens only), is one component of a comprehensive MRSA colonization surveillance program. It is not intended to diagnose MRSA infection nor to guide or monitor treatment for MRSA infections. Performed at [facility], [address] [place], [place] [address]      Basic Metabolic Panel: Recent Labs    05/21/17 0400 05/22/17 1349  NA 143 140  K 4.5 4.5  CL 115* 113*  CO2 21* 21*  GLUCOSE 69 155*  BUN 38* 33*  CREATININE 2.15* 2.39*  CALCIUM 8.6* 8.6*   Liver Function Tests: Recent Labs    05/21/17 0400 05/21/17 1418  AST 34 36  ALT 69* 65*  ALKPHOS 65 65  BILITOT 1.5* 1.7*  PROT 6.0* 6.4*  ALBUMIN 2.9* 3.1*     CBC: Recent Labs    05/20/17 2048 05/21/17 0400 05/21/17 1418  WBC 5.4 5.5 5.4  NEUTROABS 3.9  --   --  HGB 10.4* 10.0* 10.3*  HCT 31.6* 30.6* 30.9*  MCV 99.1 99.7 100.3*  PLT 76* 72* 77*    No results found.    Medications:   Impression:  Principal Problem:   Acute metabolic encephalopathy Active Problems:   Hypertension   Hyperlipidemia LDL goal <70   CAD (coronary artery disease)   Hypothermia   Severe sepsis (HCC)     Plan: Continue dual antibiotics continue strengthening and ambulation with physical therapy four-point walker administered monitor renal function  Consultants:    Procedures   Antibiotics: Vancomycin and Zosyn       Time spent: 30   LOS: 3 days   Dalanie Kisner M   05/23/2017, 6:18 PM

## 2017-05-23 NOTE — Progress Notes (Signed)
Ambulated with walker at steady pace about 100 feet and now sitting up in chair

## 2017-05-23 NOTE — Progress Notes (Signed)
Pharmacy Antibiotic Note  Terrance Miller is a 84 y.o. male admitted on 05/20/2017 with sepsis.  Pharmacy has been consulted for Vancomycin and zosyn dosing.  Worsening SCr / renal fxn.   Plan: Vancomycin 2000mg  loading dose, then 1500mg  IV IV every 48 hours.  Goal trough 15-20 mcg/mL. Zosyn 3.375g IV q8h (4 hour infusion).  F/U cxs and clinical progress Monitor V/S, labs, and levels as indicated  Height: 6' (182.9 cm) Weight: 219 lb 5.7 oz (99.5 kg) IBW/kg (Calculated) : 77.6  Temp (24hrs), Avg:97.1 F (36.2 C), Min:95.3 F (35.2 C), Max:98.3 F (36.8 C)  Recent Labs  Lab 05/20/17 1220 05/20/17 1223 05/20/17 1314 05/20/17 1458 05/20/17 1630 05/20/17 2048 05/20/17 2332 05/21/17 0400 05/21/17 1418 05/22/17 1349  WBC 7.4  --   --   --   --  5.4  --  5.5 5.4  --   CREATININE 2.26* 2.30*  --   --   --  1.97*  --  2.15*  --  2.39*  LATICACIDVEN  --   --  3.31* 1.6 1.5 1.1 1.2  --   --   --     Estimated Creatinine Clearance: 27.1 mL/min (A) (by C-G formula based on SCr of 2.39 mg/dL (H)).    No Known Allergies  Antimicrobials this admission: Vancomycin 5/15 >> Zosyn 5/15 >>   Dose adjustments this admission: n/a  Microbiology results: 5/15 BCx: pending 5/15 UCx: no growth  Sputum:   MRSA PCR: negative  Thank you for allowing pharmacy to be a part of this patient's care.  Hart Robinsons, PharmD Clinical Pharmacist Pager:  [phone] 05/23/2017   05/23/2017 10:03 AM

## 2017-05-24 NOTE — Progress Notes (Signed)
Texted Dr. Cindie Laroche concerning IV fluids this morning

## 2017-05-24 NOTE — Progress Notes (Signed)
Patient sitting in chair ambulated 100 feet with four-point walker yesterday alert and oriented currently on dual antibiotics will discharge in a.m. Gaurav Baldree SNK:[id number] DOB: 11-11-1928 DOA: 05/20/2017 PCP: Lucia Gaskins, MD   Physical Exam: Blood pressure 133/79, pulse 60, temperature (!) 97.3 F (36.3 C), temperature source Oral, resp. rate 20, height 6' (1.829 m), weight 99.5 kg (219 lb 5.7 oz), SpO2 97 %.  Lungs diminished breath sounds at the bases no rales wheeze or rhonchi appreciable heart regular rhythm no S3-S4 no he feels rubs   Investigations:  Recent Results (from the past 240 hour(s))  Blood Culture (routine x 2)     Status: None (Preliminary result)   Collection Time: 05/20/17  1:03 PM  Result Value Ref Range Status   Specimen Description BLOOD LEFT ARM  Final   Special Requests   Final    BOTTLES DRAWN AEROBIC AND ANAEROBIC Blood Culture adequate volume   Culture   Final    NO GROWTH 4 DAYS Performed at [facility], [address], [place], [place] [address]    Report Status PENDING  Incomplete  Urine Culture     Status: None   Collection Time: 05/20/17  1:04 PM  Result Value Ref Range Status   Specimen Description   Final    URINE, RANDOM Performed at [facility], [address] [place], [place] [address]    Special Requests   Final    NONE Performed at [facility], [address], [place], [place] [address]    Culture   Final    NO GROWTH Performed at [facility], [place] [address] [place], [place] [address]    Report Status 05/22/2017 FINAL  Final  Blood Culture (routine x 2)     Status: None (Preliminary result)   Collection Time: 05/20/17  1:31 PM  Result Value Ref Range Status   Specimen Description BLOOD LEFT HAND  Final   Special Requests   Final    BOTTLES DRAWN AEROBIC AND ANAEROBIC Blood Culture adequate volume   Culture   Final    NO GROWTH 4 DAYS Performed at [facility], [address], [place], [place] [address]    Report  Status PENDING  Incomplete  MRSA PCR Screening     Status: None   Collection Time: 05/20/17  6:05 PM  Result Value Ref Range Status   MRSA by PCR NEGATIVE NEGATIVE Final    Comment:        The GeneXpert MRSA Assay (FDA approved for NASAL specimens only), is one component of a comprehensive MRSA colonization surveillance program. It is not intended to diagnose MRSA infection nor to guide or monitor treatment for MRSA infections. Performed at [facility], [address], [place], [place] [address]      Basic Metabolic Panel: Recent Labs    05/22/17 1349  NA 140  K 4.5  CL 113*  CO2 21*  GLUCOSE 155*  BUN 33*  CREATININE 2.39*  CALCIUM 8.6*   Liver Function Tests: Recent Labs    05/21/17 1418  AST 36  ALT 65*  ALKPHOS 65  BILITOT 1.7*  PROT 6.4*  ALBUMIN 3.1*     CBC: Recent Labs    05/21/17 1418  WBC 5.4  HGB 10.3*  HCT 30.9*  MCV 100.3*  PLT 77*    No results found.    Medications:   Impression:  Principal Problem:   Acute metabolic encephalopathy Active Problems:   Hypertension   Hyperlipidemia LDL goal <  56   CAD (coronary artery disease)   Hypothermia   Severe sepsis ([place])     Plan: Continue thank and Zosyn will discharge in a.m. with home health physical therapy for strengthening and ambulation  Consultants: Physical therapy consult   Procedures   Antibiotics:   Time spent: 30 minutes   LOS: 4 days   Audie Wieser M   05/24/2017, 11:39 AM

## 2017-05-24 NOTE — Progress Notes (Addendum)
Physical Therapy Treatment Patient Details Name: Terrance Miller MRN: [id number] DOB: Apr 30, 1928 Today's Date: 05/24/2017    History of Present Illness Terrance Miller is a 84 y.o. male with history of diabetes, hyperlipidemia, benign essential hypertension, coronary artery disease and a hydrocele that was recently diagnosed at an ED visit who was brought into the hospital today by his 2 daughters due to confusion.  He still seems altered and is not able to give me history, hence most of the history is given by daughter at bedside.  Daughter states that she last saw him normal around 9:00 the night prior, at around 930 she states that her brother told her that he spoke via phone with the patient and he seemed normal as well.  This morning around 5 AM she heard a large commotion in his bedroom.  When she walked down he was standing in the corner, was very confused, the room was a mess with close turn all over the place and furniture out of place.  She could tell that he was "not himself", states that his eyes were glassy, there was no bowel or bladder incontinence.  She brought him to the hospital for evaluation.  In the ED he was noted to be persistently hypothermic with a temperature rectally of 91, he was otherwise hemodynamically stable.  Lactic acid was 3.3, creatinine was 2.26, admission was requested for further evaluation and management.    PT Comments    Patient present with severely swollen scrotum and c/o frequent urination - nursing staff aware.  Patient demonstrates increased endurance/distance for ambulation in room, hallways without loss of balance, only limited due to occasional veering to the left requiring VC's for safety.  Patient left in bathroom to have a BM with call light in reach - nursing staff notified.  Patient will benefit from continued physical therapy in hospital and recommended venue below to increase strength, balance, endurance for safe ADLs and gait.  Patient suffers from  coronary artery disease resulting in chronic generalized weakness which impairs his ability to perform daily activities like prolonged standing, walking and completing routine ADLs in the home.  A walker alone will not resolve the issues with performing activities of daily living. A wheelchair will allow patient to safely perform daily activities.  The patient can self propel in the home or has a caregiver who can provide assistance.    Follow Up Recommendations  Home health PT;Supervision for mobility/OOB     Equipment Recommendations  Rolling walker with 5" wheels, Wheelchair measurements, wheelchair coushin   Recommendations for Other Services       Precautions / Restrictions Precautions Precautions: Fall Restrictions Weight Bearing Restrictions: No    Mobility  Bed Mobility Overal bed mobility: Needs Assistance Bed Mobility: Supine to Sit     Supine to sit: Supervision     General bed mobility comments: slow slightly labored movement  Transfers Overall transfer level: Needs assistance   Transfers: Sit to/from Stand;Stand Pivot Transfers Sit to Stand: Supervision Stand pivot transfers: Min guard       General transfer comment: unsteady when not using AD, safer using RW  Ambulation/Gait Ambulation/Gait assistance: Min guard Ambulation Distance (Feet): 100 Feet   Gait Pattern/deviations: Decreased step length - right;Decreased step length - left;Decreased stride length Gait velocity: decreased   General Gait Details: demonstrates improved balance, increased endurance/distance for gait training, slightl drift to the left requiring occasional verbal cues to clear objects on the left, no loss of balance   Stairs  Wheelchair Mobility    Modified Rankin (Stroke Patients Only)       Balance Overall balance assessment: Needs assistance Sitting-balance support: Feet supported;No upper extremity supported Sitting balance-Leahy Scale: Good      Standing balance support: During functional activity;No upper extremity supported Standing balance-Leahy Scale: Poor Standing balance comment: fair/poor without AD, fair using RW                            Cognition Arousal/Alertness: Awake/alert Behavior During Therapy: WFL for tasks assessed/performed Overall Cognitive Status: Within Functional Limits for tasks assessed                                        Exercises General Exercises - Lower Extremity Long Arc Quad: Seated;AROM;Strengthening;Both;10 reps Hip Flexion/Marching: Seated;AROM;Strengthening;Both;10 reps Toe Raises: Seated;AROM;Strengthening;Both;10 reps Heel Raises: Seated;AROM;Strengthening;Both;10 reps    General Comments        Pertinent Vitals/Pain Pain Assessment: No/denies pain    Home Living                      Prior Function            PT Goals (current goals can now be found in the care plan section) Acute Rehab PT Goals Patient Stated Goal: return home with family to assist PT Goal Formulation: With patient Time For Goal Achievement: 05/29/17 Potential to Achieve Goals: Good Progress towards PT goals: Progressing toward goals    Frequency    Min 3X/week      PT Plan Current plan remains appropriate    Co-evaluation              AM-PAC PT "6 Clicks" Daily Activity  Outcome Measure  Difficulty turning over in bed (including adjusting bedclothes, sheets and blankets)?: None Difficulty moving from lying on back to sitting on the side of the bed? : None Difficulty sitting down on and standing up from a chair with arms (e.g., wheelchair, bedside commode, etc,.)?: None Help needed moving to and from a bed to chair (including a wheelchair)?: A Little Help needed walking in hospital room?: A Little Help needed climbing 3-5 steps with a railing? : A Little 6 Click Score: 21    End of Session   Activity Tolerance: Patient tolerated treatment  well Patient left: Other (comment);with call bell/phone within reach(patient left on commode in bathroom to attempt BM - NT nofitied) Nurse Communication: Mobility status;Other (comment)(NT notified that patient left up on commode in bathroom) PT Visit Diagnosis: Unsteadiness on feet (R26.81);Other abnormalities of gait and mobility (R26.89);Muscle weakness (generalized) (M62.81)     Time: 3500-9381 PT Time Calculation (min) (ACUTE ONLY): 26 min  Charges:  $Therapeutic Activity: 23-37 mins                    G Codes:       12:27 PM, May 29, 2017 Lonell Grandchild, MPT Physical Therapist with [facility] [phone] office [phone] mobile phone

## 2017-05-25 LAB — BASIC METABOLIC PANEL
ANION GAP: 8 (ref 5–15)
BUN: 24 mg/dL — ABNORMAL HIGH (ref 6–20)
CO2: 21 mmol/L — AB (ref 22–32)
Calcium: 8.4 mg/dL — ABNORMAL LOW (ref 8.9–10.3)
Chloride: 111 mmol/L (ref 101–111)
Creatinine, Ser: 1.88 mg/dL — ABNORMAL HIGH (ref 0.61–1.24)
GFR, EST AFRICAN AMERICAN: 36 mL/min — AB (ref >60)
GFR, EST NON AFRICAN AMERICAN: 31 mL/min — AB (ref >60)
Glucose, Bld: 110 mg/dL — ABNORMAL HIGH (ref 65–99)
Potassium: 4 mmol/L (ref 3.5–5.1)
Sodium: 140 mmol/L (ref 135–145)

## 2017-05-25 LAB — CULTURE, BLOOD (ROUTINE X 2)
Culture: NO GROWTH
Culture: NO GROWTH
SPECIAL REQUESTS: ADEQUATE
Special Requests: ADEQUATE

## 2017-05-25 MED ORDER — ENOXAPARIN SODIUM 40 MG/0.4ML ~~LOC~~ SOLN: 40.0000 mg | SUBCUTANEOUS | Status: DC

## 2017-05-25 NOTE — Care Management (Signed)
Pt has mobility limitations due to general weakness and encephelopathy that impair their ability to do one or more mobility-related activities of daily living in customary locations in the home. Patient cannot use crutches, cane, or walker to resolve the issue sufficiently, patient can safely self propel the wheelchair in the home or has a care giver that can assist.

## 2017-05-25 NOTE — Care Management Important Message (Signed)
Important Message  Patient Details  Name: Terrance Miller MRN: [id number] Date of Birth: 07/18/1928   Medicare Important Message Given:  Yes    Shelda Altes 05/25/2017, 11:35 AM

## 2017-05-25 NOTE — Discharge Summary (Signed)
Physician Discharge Summary  Terrance Miller NFA:[id number] DOB: November 03, 1928 DOA: 05/20/2017  PCP: Lucia Gaskins, MD  Admit date: 05/20/2017 Discharge date: 05/25/2017   Recommendations for Outpatient Follow-up:  Patient patient is instructed to take Flomax 0.4 mg p.o. daily.  In addition he is instructed to take Norvasc 5 mg p.o. daily alone and to follow-up with urology regarding BPH and bladder outlet obstruction he is likewise to continue all other prehospital medications and to continue physical therapy with home health which is ordered and a rolling walker.  He is likewise instructed to follow-up with my office within 1 to 2 weeks time discharge Diagnoses:  Principal Problem:   Acute metabolic encephalopathy Active Problems:   Hypertension   Hyperlipidemia LDL goal <70   CAD (coronary artery disease)   Hypothermia   Severe sepsis [facility])   Discharge Condition: Good  Filed Weights   05/20/17 1815 05/21/17 0500 05/22/17 0500  Weight: 102.3 kg (225 lb 8.5 oz) 99.4 kg (219 lb 2.2 oz) 99.5 kg (219 lb 5.7 oz)    History of present illness:  The patient is an 84 year old black male who came in with hypothermia some altered mental status was considered to be septic placed on vancomycin and Zosyn Foley was inserted revealing hematuria and question of UTI he was seen in consult by urology for bladder outlet obstruction Flomax 0.4 mg p.o. daily was added urology saw the patient and felt that the hematuria may have been due to the insertion of the Foley and will follow up with him as an outpatient he was continued on vancomycin and Zosyn empirically no focus of infection was ascertained and this was discontinued his blood pressure was significantly elevated Norvasc 5 mg p.o. daily was added to his regimen and he was normotensive at time of hospital discharge he is instructed to follow-up with urology as an outpatient home health PT was ordered for him with a rolling walker and to follow-up in  my office in 1 to 2 weeks time thank you  Hospital Course:  See HPI above  Procedures:  Foley inserted  Consultations:  Urology  Discharge Instructions  Discharge Instructions    Discharge instructions   Complete by:  As directed    Discharge patient   Complete by:  As directed    Discharge disposition:  01-Home or Self Care   Discharge patient date:  05/25/2017     Allergies as of 05/25/2017   No Known Allergies     Medication List    TAKE these medications   amLODipine 10 MG tablet Commonly known as:  NORVASC Take 10 mg by mouth daily.   amLODipine 5 MG tablet Commonly known as:  NORVASC Take 1 tablet (5 mg total) by mouth daily.   cholecalciferol 1000 units tablet Commonly known as:  VITAMIN D Take 1,000 Units by mouth daily.   furosemide 40 MG tablet Commonly known as:  LASIX Take 1 tablet (40 mg total) by mouth daily.   meclizine 25 MG tablet Commonly known as:  ANTIVERT Take 25 mg by mouth daily.   metoprolol tartrate 50 MG tablet Commonly known as:  LOPRESSOR Take 1 tablet (50 mg total) by mouth 2 (two) times daily. **Patient MUST make and appointment for further refills** What changed:    how much to take  additional instructions   tamsulosin 0.4 MG Caps capsule Commonly known as:  FLOMAX Take 0.4 mg by mouth daily.   TRAVATAN Z 0.004 % Soln ophthalmic solution Generic drug:  Travoprost (BAK Free) Place 1 drop into both eyes 2 (two) times daily.            Durable Medical Equipment  (From admission, onward)        Start     Ordered   05/25/17 1213  For home use only DME Walker rolling  Once    Question:  Patient needs a walker to treat with the following condition  Answer:  Weakness   05/25/17 1212   05/25/17 0839  For home use only DME standard manual wheelchair with seat cushion  Once    Comments:  Patient suffers from weakness which impairs their ability to perform daily activities like ambulating in the home.  A walker will  not resolve  issue with performing activities of daily living. A wheelchair will allow patient to safely perform daily activities. Patient can safely propel the wheelchair in the home or has a caregiver who can provide assistance.  Accessories: elevating leg rests (ELRs), wheel locks, extensions and anti-tippers.   05/25/17 0839   05/23/17 1216  For home use only DME 4 wheeled rolling walker with seat  Once    Question:  Patient needs a walker to treat with the following condition  Answer:  Deconditioned low back   05/23/17 1216     No Known Allergies    The results of significant diagnostics from this hospitalization (including imaging, microbiology, ancillary and laboratory) are listed below for reference.    Significant Diagnostic Studies: Mr Brain Wo Contrast  Result Date: 05/20/2017 CLINICAL DATA:  Altered mental status, last seen normal last night. History of hypertension, hyperlipidemia and diabetes. EXAM: MRI HEAD WITHOUT CONTRAST TECHNIQUE: Multiplanar, multiecho pulse sequences of the brain and surrounding structures were obtained without intravenous contrast. COMPARISON:  CT HEAD May 20, 2017 FINDINGS: Multiple sequences are mildly motion degraded. INTRACRANIAL CONTENTS: No reduced diffusion to suggest acute ischemia, hypercellular tumor or typical infection. No susceptibility artifact to suggest hemorrhage. The ventricles and sulci are normal for patient's age. Minimal supratentorial and pontine white matter FLAIR T2 hyperintensities compatible chronic small vessel ischemic disease, less than expected for age. No suspicious parenchymal signal, masses, mass effect. No abnormal extra-axial fluid collections. No extra-axial masses. VASCULAR: Normal major intracranial vascular flow voids present at skull base. SKULL AND UPPER CERVICAL SPINE: No abnormal sellar expansion. No suspicious calvarial bone marrow signal. Craniocervical junction maintained. SINUSES/ORBITS: Chronic RIGHT maxillary  sinusitis. Mastoid air cells are well aerated.The included ocular globes and orbital contents are non-suspicious. Status post bilateral ocular lens implants. OTHER: None. IMPRESSION: 1. Mild motion degraded examination. 2. Negative noncontrast MRI of the head for age. Electronically Signed   By: Elon Alas M.D.   On: 05/20/2017 19:26   Dg Chest Port 1 View  Result Date: 05/21/2017 CLINICAL DATA:  Acute onset of sepsis. Confusion. EXAM: PORTABLE CHEST 1 VIEW COMPARISON:  Chest radiograph performed 05/20/2017 FINDINGS: The lungs are well-aerated. Vascular congestion is noted. Increased interstitial markings raise concern for mild interstitial edema, though mild right basilar pneumonia cannot be entirely excluded, given the patient's presentation. There is no evidence of significant pleural effusion or pneumothorax. The cardiomediastinal silhouette is borderline normal in size. No acute osseous abnormalities are seen. IMPRESSION: Vascular congestion noted. Increased interstitial markings raise concern for mild interstitial edema, though mild right basilar pneumonia cannot be entirely excluded, given the patient's presentation. Electronically Signed   By: Garald Balding M.D.   On: 05/21/2017 05:35   Dg Chest Portable 1 View  Result  Date: 05/20/2017 CLINICAL DATA:  Sepsis. EXAM: PORTABLE CHEST 1 VIEW COMPARISON:  None. FINDINGS: The cardiac silhouette is borderline to mildly enlarged. Aortic atherosclerosis is noted. The lungs are mildly hypoinflated with mild accentuation of the interstitial markings. No overt pulmonary edema, airspace consolidation, sizeable pleural effusion, or pneumothorax is identified. Multiple metallic BBs are noted in the soft tissues of the left axillary region and upper arm. IMPRESSION: No active disease. Electronically Signed   By: Logan Bores M.D.   On: 05/20/2017 13:31   Ct Head Code Stroke Wo Contrast  Result Date: 05/20/2017 CLINICAL DATA:  Code stroke.  Altered level  of consciousness. EXAM: CT HEAD WITHOUT CONTRAST TECHNIQUE: Contiguous axial images were obtained from the base of the skull through the vertex without intravenous contrast. COMPARISON:  None. FINDINGS: Brain: Generalized atrophy without hydrocephalus. Negative for acute infarct, hemorrhage, or mass lesion. No edema or midline shift. Vascular: Atherosclerotic disease.  Negative for hyperdense vessel Skull: Negative Sinuses/Orbits: Chronic sinusitis with mucoperiosteal thickening right greater than left. Bilateral sinus surgery. Bilateral cataract surgery. Other: None ASPECTS ([place] Stroke Program Early CT Score) - Ganglionic level infarction (caudate, lentiform nuclei, internal capsule, insula, M1-M3 cortex): 7 - Supraganglionic infarction (M4-M6 cortex): 3 Total score (0-10 with 10 being normal): 10 IMPRESSION: 1. No acute abnormality 2. ASPECTS is 10 3. These results were called by telephone at the time of interpretation on 05/20/2017 at 12:16 pm to Dr. Milton Ferguson , who verbally acknowledged these results. Electronically Signed   By: [name] Gallo M.D.   On: 05/20/2017 12:17    Microbiology: Recent Results (from the past 240 hour(s))  Blood Culture (routine x 2)     Status: None   Collection Time: 05/20/17  1:03 PM  Result Value Ref Range Status   Specimen Description BLOOD LEFT ARM  Final   Special Requests   Final    BOTTLES DRAWN AEROBIC AND ANAEROBIC Blood Culture adequate volume   Culture   Final    NO GROWTH 5 DAYS Performed at [facility], [address] [place], [place] [address]    Report Status 05/25/2017 FINAL  Final  Urine Culture     Status: None   Collection Time: 05/20/17  1:04 PM  Result Value Ref Range Status   Specimen Description   Final    URINE, RANDOM Performed at [facility], [address] [place], [place] [address]    Special Requests   Final    NONE Performed at [facility], [address] [place], [place] [address]    Culture   Final    NO  GROWTH Performed at [facility], [place] [address], [place], [place] [address]    Report Status 05/22/2017 FINAL  Final  Blood Culture (routine x 2)     Status: None   Collection Time: 05/20/17  1:31 PM  Result Value Ref Range Status   Specimen Description BLOOD LEFT HAND  Final   Special Requests   Final    BOTTLES DRAWN AEROBIC AND ANAEROBIC Blood Culture adequate volume   Culture   Final    NO GROWTH 5 DAYS Performed at [facility], [address] [place],  [address]    Report Status 05/25/2017 FINAL  Final  MRSA PCR Screening     Status: None   Collection Time: 05/20/17  6:05 PM  Result Value Ref Range Status   MRSA by PCR NEGATIVE NEGATIVE Final    Comment:        The  GeneXpert MRSA Assay (FDA approved for NASAL specimens only), is one component of a comprehensive MRSA colonization surveillance program. It is not intended to diagnose MRSA infection nor to guide or monitor treatment for MRSA infections. Performed at [facility], [address], [place], [place] [address]      Labs: Basic Metabolic Panel: Recent Labs  Lab 05/20/17 1220 05/20/17 1223 05/20/17 2048 05/21/17 0400 05/22/17 1349 05/25/17 0512  NA 141 143 141 143 140 140  K 4.6 4.6 4.4 4.5 4.5 4.0  CL 106 110 113* 115* 113* 111  CO2 23  --  22 21* 21* 21*  GLUCOSE 194* 186* 93 69 155* 110*  BUN 44* 37* 38* 38* 33* 24*  CREATININE 2.26* 2.30* 1.97* 2.15* 2.39* 1.88*  CALCIUM 9.7  --  8.9 8.6* 8.6* 8.4*   Liver Function Tests: Recent Labs  Lab 05/20/17 1220 05/20/17 2048 05/21/17 0400 05/21/17 1418  AST 50* 38 34 36  ALT 95* 80* 69* 65*  ALKPHOS 80 70 65 65  BILITOT 0.7 1.0 1.5* 1.7*  PROT 7.8 6.5 6.0* 6.4*  ALBUMIN 3.8 3.2* 2.9* 3.1*   No results for input(s): LIPASE, AMYLASE in the last 168 hours. Recent Labs  Lab 05/21/17 1418  AMMONIA 36*   CBC: Recent Labs  Lab 05/20/17 1220 05/20/17 1223 05/20/17 2048 05/21/17 0400 05/21/17 1418  WBC 7.4  --  5.4 5.5 5.4   NEUTROABS 5.8  --  3.9  --   --   HGB 11.7* 12.2* 10.4* 10.0* 10.3*  HCT 35.1* 36.0* 31.6* 30.6* 30.9*  MCV 98.9  --  99.1 99.7 100.3*  PLT 76*  --  76* 72* 77*   Cardiac Enzymes: No results for input(s): CKTOTAL, CKMB, CKMBINDEX, TROPONINI in the last 168 hours. BNP: BNP (last 3 results) No results for input(s): BNP in the last 8760 hours.  ProBNP (last 3 results) No results for input(s): PROBNP in the last 8760 hours.  CBG: Recent Labs  Lab 05/20/17 [place]       Signed:  Jamarion Jumonville M   Pager: [phone] 05/25/2017, 12:17 PM

## 2017-05-25 NOTE — Care Management Note (Addendum)
Case Management Note  Patient Details  Name: Terrance Miller MRN: [id number] Date of Birth: [birth year]  Expected Discharge Date:  05/25/17               Expected Discharge Plan:  [place]  In-House Referral:  NA  Discharge planning Services  CM Consult  Post Acute Care Choice:  Home Health, Durable Medical Equipment Choice offered to:  Patient  DME Arranged:  Wheelchair manual, Walker rolling DME Agency:  [place]:  [facility] [place] Agency:  [place]  Status of Service:  Completed, signed off  Additional Comments: DC home today with [place]. Juliann Pulse, [facility] rep, aware of DC and will pull pt info from chart. Pt/family aware HH has 48 hrs to make first visit. Pt ordered RW, Rollator and WC. Juliann Pulse, [facility] rep, will discuss with family what insurance will and will not pay for and family will need to decide which DME they would like.   Addendum: Family request 3 in 1. Verbal order given by MD. [facility] rep, aware. Family okay with DME being delivered to home with WC.   Sherald Barge, RN 05/25/2017, 12:59 PM

## 2017-05-26 ENCOUNTER — Ambulatory Visit (INDEPENDENT_AMBULATORY_CARE_PROVIDER_SITE_OTHER): Payer: Medicare HMO | Admitting: Urology

## 2017-05-26 DIAGNOSIS — N430 Encysted hydrocele: Secondary | ICD-10-CM | POA: Diagnosis not present

## 2017-05-26 LAB — RPR: RPR Ser Ql: NONREACTIVE

## 2017-09-29 ENCOUNTER — Ambulatory Visit (INDEPENDENT_AMBULATORY_CARE_PROVIDER_SITE_OTHER): Payer: Medicare HMO | Admitting: Urology

## 2017-09-29 DIAGNOSIS — N430 Encysted hydrocele: Secondary | ICD-10-CM

## 2020-10-14 ENCOUNTER — Encounter (HOSPITAL_COMMUNITY): Payer: Self-pay

## 2020-10-14 ENCOUNTER — Other Ambulatory Visit: Payer: Self-pay

## 2020-10-14 ENCOUNTER — Emergency Department (HOSPITAL_COMMUNITY)
Admission: EM | Admit: 2020-10-14 | Discharge: 2020-10-14 | Disposition: A | Discharge status: Home / Self Care | Payer: Medicare Other | Attending: Emergency Medicine | Admitting: Emergency Medicine

## 2020-10-14 DIAGNOSIS — E119 Type 2 diabetes mellitus without complications: Secondary | ICD-10-CM | POA: Diagnosis not present

## 2020-10-14 DIAGNOSIS — Z79899 Other long term (current) drug therapy: Secondary | ICD-10-CM | POA: Diagnosis not present

## 2020-10-14 DIAGNOSIS — Z87891 Personal history of nicotine dependence: Secondary | ICD-10-CM | POA: Insufficient documentation

## 2020-10-14 DIAGNOSIS — I10 Essential (primary) hypertension: Secondary | ICD-10-CM | POA: Diagnosis not present

## 2020-10-14 DIAGNOSIS — I2510 Atherosclerotic heart disease of native coronary artery without angina pectoris: Secondary | ICD-10-CM | POA: Diagnosis not present

## 2020-10-14 DIAGNOSIS — N5089 Other specified disorders of the male genital organs: Secondary | ICD-10-CM | POA: Insufficient documentation

## 2020-10-14 LAB — URINALYSIS, ROUTINE W REFLEX MICROSCOPIC
Bilirubin Urine: NEGATIVE
Glucose, UA: NEGATIVE mg/dL
Hgb urine dipstick: NEGATIVE
Ketones, ur: NEGATIVE mg/dL
Leukocytes,Ua: NEGATIVE
Nitrite: NEGATIVE
Protein, ur: NEGATIVE mg/dL
Specific Gravity, Urine: 1.014 (ref 1.005–1.030)
pH: 5 (ref 5.0–8.0)

## 2020-10-14 NOTE — ED Triage Notes (Signed)
Pt c/o groin swelling for awhile but now unable to reach his penis. Pt was able to urinate this morning/.

## 2020-10-14 NOTE — Discharge Instructions (Addendum)
You have been seen and discharged from the emergency department.  Call urology office tomorrow for reevaluation and definitive care.  Tell them the swelling has worsened and you can no longer find/hold the penis to urinate. Take home medications as prescribed. If you have any worsening symptoms, inability to urinate, suprapubic pain, fever, redness of the groin/testicles or further concerns for your health please return to an emergency department for further evaluation.

## 2020-10-14 NOTE — ED Provider Notes (Addendum)
[facility] EMERGENCY DEPARTMENT Provider Note   CSN: [id number] Arrival date & time: 10/14/20  3614     History Chief Complaint  Patient presents with   Groin Swelling    Terrance Miller is a 87 y.o. male.  HPI  87 year old male with past medical history of CAD, HTN, HLD, DM, chronic hydrocele presents emergency department worsening testicular swelling.  History obtained from patient and family member at bedside.  Family member believes that the patient has a chronic hydrocele.  She states that they have been following with a urologist.  They recommended surgery to remove the hydrocele however they hesitated due to the patient's elderly age.  This morning the patient got up from sleep because he was having a hard time finding his penis to hold it to go to the bathroom.  Initially she thought he was unable to pee.  However the patient was able to void urine without difficulty.  Patient is complaining of ongoing discomfort from the testicular swelling but denies any abdominal pain, fever, recent illness.  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/28/2013   Hypertension 09/28/2013   Hyperlipidemia LDL goal <70 09/28/2013   Lower extremity edema 09/28/2013   CAD (coronary artery disease) 09/28/2013    Past Surgical History:  Procedure Laterality Date   CORONARY ANGIOPLASTY WITH STENT PLACEMENT  09/05/2002   stent RCA, 80% first diagonal, 70-80% mid-diagonal, 80% mid LAD stenosis   NM MYOCAR PERF WALL MOTION  11/01/2008   Normal       Family History  Problem Relation Age of Onset   Hypertension Father    Diabetes Sister     Social History    Tobacco Use   Smoking status: Former    Types: Cigarettes   Smokeless tobacco: Never  Substance Use Topics   Alcohol use: Not Currently   Drug use: No    Home Medications Prior to Admission medications   Medication Sig Start Date End Date Taking? Authorizing Provider  amLODipine (NORVASC) 10 MG tablet Take 10 mg by mouth daily.    [provider]  amLODipine (NORVASC) 5 MG tablet Take 1 tablet (5 mg total) by mouth daily. 11/22/13   Troy Sine, MD  cholecalciferol (VITAMIN D) 1000 units tablet Take 1,000 Units by mouth daily.    [provider]  furosemide (LASIX) 40 MG tablet Take 1 tablet (40 mg total) by mouth daily. 11/22/13   Troy Sine, MD  meclizine (ANTIVERT) 25 MG tablet Take 25 mg by mouth daily.    [provider]  metoprolol (LOPRESSOR) 50 MG tablet Take 1 tablet (50 mg total) by mouth 2 (two) times daily. **Patient MUST make and appointment for further refills** Patient taking differently: Take 25 mg by mouth 2 (two) times daily. **Patient MUST make and appointment for further refills** 11/22/13   Troy Sine, MD  tamsulosin (FLOMAX) 0.4 MG CAPS capsule Take 0.4 mg by mouth daily.    [provider]  TRAVATAN Z 0.004 % SOLN ophthalmic solution Place 1 drop into both eyes 2 (two) times daily. 04/13/17   [provider]  Allergies    Patient has no known allergies.  Review of Systems   Review of Systems  Constitutional:  Negative for chills and fever.  HENT:  Negative for congestion.   Eyes:  Negative for visual disturbance.  Respiratory:  Negative for shortness of breath.   Cardiovascular:  Negative for chest pain.  Gastrointestinal:  Negative for abdominal pain, diarrhea and vomiting.  Genitourinary:  Positive for flank pain, scrotal swelling and testicular pain. Negative for difficulty urinating, hematuria, penile discharge, penile pain and penile swelling.  Musculoskeletal:  Negative for back pain.   Skin:  Negative for rash.  Neurological:  Negative for headaches.   Physical Exam Updated Vital Signs BP (!) 176/79   Pulse 64   Temp 98.1 F (36.7 C) (Oral)   Resp 19   Ht 5\' 6"  (1.676 m)   Wt 102.4 kg   SpO2 100%   BMI 36.45 kg/m   Physical Exam Vitals and nursing note reviewed.  Constitutional:      General: He is not in acute distress.    Appearance: Normal appearance.  HENT:     Head: Normocephalic.     Mouth/Throat:     Mouth: Mucous membranes are moist.  Cardiovascular:     Rate and Rhythm: Normal rate.  Pulmonary:     Effort: Pulmonary effort is normal. No respiratory distress.  Abdominal:     Palpations: Abdomen is soft.     Tenderness: There is no abdominal tenderness.  Genitourinary:    Comments: Swollen testicle, thickened skin of the testicle without erythema or warmth, no cellulitic findings in the groin, penis is retracted within the testicular swelling and skin. Urine is draining. Skin:    General: Skin is warm.  Neurological:     Mental Status: He is alert. Mental status is at baseline.  Psychiatric:        Mood and Affect: Mood normal.    ED Results / Procedures / Treatments   Labs (all labs ordered are listed, but only abnormal results are displayed) Labs Reviewed  URINALYSIS, ROUTINE W REFLEX MICROSCOPIC    EKG None  Radiology No results found.  Procedures Procedures   Medications Ordered in ED Medications - No data to display  ED Course  I have reviewed the triage vital signs and the nursing notes.  Pertinent labs & imaging results that were available during my care of the patient were reviewed by me and considered in my medical decision making (see chart for details).    MDM Rules/Calculators/A&P                           87 year old male presents the emergency department with ongoing testicular swelling.  Patient had a difficult time finding his penis this morning to hold to urinate.  There was a misunderstanding with the  family that he was unable to pass urine however he has urinated twice here without difficulty.  Urinalysis shows no infection.  The swelling that we are seeing today that the family member and patient states has been chronic, no sudden increase in size.  The penis is hard to find, it is significantly retracted and the swelling/skin of the testicle but he urinates freely.  The skin of the testicle is thickened but no signs of cellulitis or Fournier's in the groin.  No fever.    Patient has been ambulatory at baseline.  Patient has voided urine without difficulty, has no suprapubic tenderness, benign abdomen.  No signs of urinary obstruction.  Recommend that they follow-up with urology tomorrow morning for more definitive treatment.  Patient and family member agree with this discharge plan.  Educated the patient and family member to watch for any signs of urinary obstruction or infection in the groin.  Patient at this time appears safe and stable for discharge and will be treated as an outpatient.  Discharge plan and strict return to ED precautions discussed, patient verbalizes understanding and agreement.  Final Clinical Impression(s) / ED Diagnoses Final diagnoses:  None    Rx / DC Orders ED Discharge Orders     None        Lorelle Gibbs, DO 10/14/20 1428    Leevon Upperman, Alvin Critchley, DO 10/14/20 1429

## 2020-10-14 NOTE — ED Notes (Signed)
Pt cleaned up from urination on himself.

## 2020-10-25 ENCOUNTER — Other Ambulatory Visit: Payer: Self-pay

## 2020-10-25 ENCOUNTER — Encounter: Payer: Self-pay | Admitting: Urology

## 2020-10-25 ENCOUNTER — Ambulatory Visit (INDEPENDENT_AMBULATORY_CARE_PROVIDER_SITE_OTHER): Payer: Medicare Other | Admitting: Urology

## 2020-10-25 VITALS — BP 174/74 | HR 60

## 2020-10-25 DIAGNOSIS — N433 Hydrocele, unspecified: Secondary | ICD-10-CM

## 2020-10-25 DIAGNOSIS — N5089 Other specified disorders of the male genital organs: Secondary | ICD-10-CM

## 2020-10-25 LAB — MICROSCOPIC EXAMINATION
Bacteria, UA: NONE SEEN
RBC: NONE SEEN /hpf (ref 0–2)
Renal Epithel, UA: NONE SEEN /hpf

## 2020-10-25 LAB — URINALYSIS, ROUTINE W REFLEX MICROSCOPIC
Bilirubin, UA: NEGATIVE
Glucose, UA: NEGATIVE
Leukocytes,UA: NEGATIVE
Nitrite, UA: NEGATIVE
RBC, UA: NEGATIVE
Specific Gravity, UA: 1.025 (ref 1.005–1.030)
Urobilinogen, Ur: 1 mg/dL (ref 0.2–1.0)
pH, UA: 5 (ref 5.0–7.5)

## 2020-10-25 NOTE — Progress Notes (Signed)
Assessment: 1. Scrotal swelling   2. Hydrocele, unspecified hydrocele type     Plan: Diagnosis and management of a hydrocele discussed with the patient and his daughter today.  Standard treatment involving a hydrocelectomy was discussed. Recommend repeat evaluation with scrotal U/S due to apparent increase in size of hydrocele Return to office after U/S done  Chief Complaint:  Chief Complaint  Patient presents with   Hydrocele     History of Present Illness:  Terrance Miller is a 87 y.o. year old male who is seen for evaluation of large hydrocele.  He has a history of a scrotal hydrocele for a number of years.  This was previously evaluated with an ultrasound in 4/19 which showed a large left hydrocele and trace right hydrocele with normal testes bilaterally.  He was recently seen in the emergency room for a apparent increase in the size of the hydrocele.  He reports some difficulty with urination due to the hydrocele.  He does have some mild discomfort associated with a hydrocele.  He reports nocturia and urge incontinence.  No dysuria or gross hematuria.  He is currently on tamsulosin. AUA score = 16 today.   Past Medical History:  Past Medical History:  Diagnosis Date   Acute anterior wall MI ([place]) 2004   Coronary artery disease    Diabetes mellitus without complication ([place])    Hyperlipidemia    Hypertension     Past Surgical History:  Past Surgical History:  Procedure Laterality Date   CORONARY ANGIOPLASTY WITH STENT PLACEMENT  09/05/2002   stent RCA, 80% first diagonal, 70-80% mid-diagonal, 80% mid LAD stenosis   NM MYOCAR PERF WALL MOTION  11/01/2008   Normal    Allergies:  No Known Allergies  Family History:  Family History  Problem Relation Age of Onset   Hypertension Father    Diabetes Sister     Social History:  Social History   Tobacco Use   Smoking status: Former    Types: Cigarettes   Smokeless tobacco: Never  Substance Use Topics   Alcohol  use: Not Currently   Drug use: No    Review of symptoms:  Constitutional:  Negative for unexplained weight loss, night sweats, fever, chills ENT:  Negative for nose bleeds, sinus pain, painful swallowing CV:  Negative for chest pain, shortness of breath, exercise intolerance, palpitations, loss of consciousness Resp:  Negative for cough, wheezing, shortness of breath GI:  Negative for nausea, vomiting, diarrhea, bloody stools GU:  Positives noted in HPI; otherwise negative for gross hematuria, dysuria Neuro:  Negative for seizures, poor balance, limb weakness, slurred speech Psych:  Negative for lack of energy, depression, anxiety Endocrine:  Negative for polydipsia, polyuria, symptoms of hypoglycemia (dizziness, hunger, sweating) Hematologic:  Negative for anemia, purpura, petechia, prolonged or excessive bleeding, use of anticoagulants  Allergic:  Negative for difficulty breathing or choking as a result of exposure to anything; no shellfish allergy; no allergic response (rash/itch) to materials, foods  Physical exam: BP (!) 174/74   Pulse 60  GENERAL APPEARANCE:  Well appearing, well developed, well nourished, NAD HEENT: Atraumatic, Normocephalic, oropharynx clear. NECK: Supple without lymphadenopathy or thyromegaly. LUNGS: Clear to auscultation bilaterally. HEART: Regular Rate and Rhythm without murmurs, gallops, or rubs. ABDOMEN: Soft, non-tender, No Masses. EXTREMITIES: Moves all extremities well.  Without clubbing, cyanosis, or edema. NEUROLOGIC:  Alert and oriented x 3, normal gait, CN II-XII grossly intact.  MENTAL STATUS:  Appropriate. BACK:  Non-tender to palpation.  No CVAT SKIN:  Warm, dry and intact.  GU: Penis:  uncircumcised, unable to visualize due to large scrotum Meatus: not visualized Scrotum: significant enlargement, appears to be R>L, NT, no erythema Testis: unable to palpated due to scrotal enlargement     Results: Results for orders placed or performed in  visit on 10/25/20 (from the past 24 hour(s))  Microscopic Examination   Collection Time: 10/25/20  2:34 PM   Urine  Result Value Ref Range   WBC, UA 0-5 0 - 5 /hpf   RBC None seen 0 - 2 /hpf   Epithelial Cells (non renal) 0-10 0 - 10 /hpf   Renal Epithel, UA None seen None seen /hpf   Mucus, UA Present Not Estab.   Bacteria, UA None seen None seen/Few  Urinalysis, Routine w reflex microscopic   Collection Time: 10/25/20  2:34 PM  Result Value Ref Range   Specific Gravity, UA 1.025 1.005 - 1.030   pH, UA 5.0 5.0 - 7.5   Color, UA Amber (A) Yellow   Appearance Ur Clear Clear   Leukocytes,UA Negative Negative   Protein,UA 1+ (A) Negative/Trace   Glucose, UA Negative Negative   Ketones, UA Trace (A) Negative   RBC, UA Negative Negative   Bilirubin, UA Negative Negative   Urobilinogen, Ur 1.0 0.2 - 1.0 mg/dL   Nitrite, UA Negative Negative   Microscopic Examination See below:

## 2020-10-25 NOTE — Progress Notes (Signed)
Urological Symptom Review  Patient is experiencing the following symptoms: Get up at night to urinate Erection problems (male only)   Review of Systems  Gastrointestinal (upper)  : Negative for upper GI symptoms  Gastrointestinal (lower) : Negative for lower GI symptoms  Constitutional : Negative for symptoms  Skin: Negative for skin symptoms  Eyes: Negative for eye symptoms  Ear/Nose/Throat : Negative for Ear/Nose/Throat symptoms  Hematologic/Lymphatic: Negative for Hematologic/Lymphatic symptoms  Cardiovascular : Negative for cardiovascular symptoms  Respiratory : Negative for respiratory symptoms  Endocrine: Negative for endocrine symptoms  Musculoskeletal: Negative for musculoskeletal symptoms  Neurological: Negative for neurological symptoms  Psychologic: Negative for psychiatric symptoms

## 2020-10-26 DIAGNOSIS — N433 Hydrocele, unspecified: Secondary | ICD-10-CM | POA: Insufficient documentation

## 2020-11-15 ENCOUNTER — Ambulatory Visit (HOSPITAL_COMMUNITY)
Admission: RE | Admit: 2020-11-15 | Discharge: 2020-11-15 | Disposition: A | Discharge status: Home / Self Care | Payer: Medicare Other | Source: Ambulatory Visit | Attending: Urology | Admitting: Urology

## 2020-11-15 ENCOUNTER — Other Ambulatory Visit: Payer: Self-pay

## 2020-11-15 DIAGNOSIS — N433 Hydrocele, unspecified: Secondary | ICD-10-CM | POA: Insufficient documentation

## 2020-11-20 ENCOUNTER — Encounter: Payer: Self-pay | Admitting: Urology

## 2020-11-20 ENCOUNTER — Other Ambulatory Visit: Payer: Self-pay

## 2020-11-20 ENCOUNTER — Ambulatory Visit (INDEPENDENT_AMBULATORY_CARE_PROVIDER_SITE_OTHER): Payer: Medicare Other | Admitting: Urology

## 2020-11-20 VITALS — BP 170/81 | HR 56 | Temp 98.7°F | Wt 221.0 lb

## 2020-11-20 DIAGNOSIS — N433 Hydrocele, unspecified: Secondary | ICD-10-CM

## 2020-11-20 DIAGNOSIS — N5089 Other specified disorders of the male genital organs: Secondary | ICD-10-CM

## 2020-11-20 NOTE — Progress Notes (Signed)
Urological Symptom Review  Patient is experiencing the following symptoms: Leakage of urine   Review of Systems  Gastrointestinal (upper)  : Negative for upper GI symptoms  Gastrointestinal (lower) : Negative for lower GI symptoms  Constitutional : Negative for symptoms  Skin: Negative for skin symptoms  Eyes: Negative for eye symptoms  Ear/Nose/Throat : Negative for Ear/Nose/Throat symptoms  Hematologic/Lymphatic: Negative for Hematologic/Lymphatic symptoms  Cardiovascular : Negative for cardiovascular symptoms  Respiratory : Negative for respiratory symptoms  Endocrine: Negative for endocrine symptoms  Musculoskeletal: Negative for musculoskeletal symptoms  Neurological: Negative for neurological symptoms  Psychologic: Negative for psychiatric symptoms  

## 2020-11-20 NOTE — Progress Notes (Signed)
Assessment: 1. Hydrocele, unspecified hydrocele type   2. Scrotal swelling      Plan: Diagnosis and management of a hydrocele discussed with the patient and his daughter today.  Surgical management with hydrocelectomy discussed.  Risks of procedure reviewed in detail.  Specifically, we discussed the increased risk of anesthesia given his advanced age.  Questions were answered. Following our discussion, the patient and his daughter elected to consider the options and discuss further. He will contact me if he wishes to proceed. Return to office as needed.  Chief Complaint:  Chief Complaint  Patient presents with   Hydrocele    History of Present Illness:  Terrance Miller is a 87 y.o. year old male who is seen for further evaluation of large bilateral hydroceles.  He has a history of a scrotal hydrocele for a number of years.  This was previously evaluated with an ultrasound in 4/19 which showed a large left hydrocele and trace right hydrocele with normal testes bilaterally.  He was recently seen in the emergency room for a apparent increase in the size of the hydrocele.  He reports some difficulty with urination due to the hydrocele.  He does have some mild discomfort associated with a hydrocele.  He reports nocturia and urge incontinence.  No dysuria or gross hematuria.  He is currently on tamsulosin. AUA score = 16. Scrotal ultrasound from 11/15/2020 shows large bilateral hydroceles with debris in the right hydrocele, compression of the testicles by the hydrocele with normal blood flow. He has not had any change in his symptoms since his last visit.  Portions of the above documentation were copied from a prior visit for review purposes only.   Past Medical History:  Past Medical History:  Diagnosis Date   Acute anterior wall MI ([place]) 2004   Coronary artery disease    Diabetes mellitus without complication ([place])    Hyperlipidemia    Hypertension     Past Surgical History:   Past Surgical History:  Procedure Laterality Date   CORONARY ANGIOPLASTY WITH STENT PLACEMENT  09/05/2002   stent RCA, 80% first diagonal, 70-80% mid-diagonal, 80% mid LAD stenosis   NM MYOCAR PERF WALL MOTION  11/01/2008   Normal    Allergies:  No Known Allergies  Family History:  Family History  Problem Relation Age of Onset   Hypertension Father    Diabetes Sister     Social History:  Social History   Tobacco Use   Smoking status: Former    Types: Cigarettes   Smokeless tobacco: Never  Substance Use Topics   Alcohol use: Not Currently   Drug use: No    ROS: Constitutional:  Negative for fever, chills, weight loss CV: Negative for chest pain, previous MI, hypertension Respiratory:  Negative for shortness of breath, wheezing, sleep apnea, frequent cough GI:  Negative for nausea, vomiting, bloody stool, GERD  Physical exam: BP (!) 170/81   Pulse (!) 56   Temp 98.7 F (37.1 C)   Wt 221 lb (100.2 kg)   BMI 35.67 kg/m  GENERAL APPEARANCE:  Well appearing, well developed, well nourished, NAD HEENT: Atraumatic, Normocephalic, oropharynx clear. NECK: Supple without lymphadenopathy or thyromegaly. LUNGS: Clear to auscultation bilaterally. HEART: Regular Rate and Rhythm without murmurs, gallops, or rubs. ABDOMEN: Soft, non-tender, No Masses. EXTREMITIES: Moves all extremities well.  Without clubbing, cyanosis, or edema. NEUROLOGIC:  Alert and oriented x 3, normal gait, CN II-XII grossly intact.  MENTAL STATUS:  Appropriate. BACK:  Non-tender to palpation.  No  CVAT SKIN:  Warm, dry and intact.   GU: Penis:  uncircumcised Meatus: Not visualized Scrotum: Significant enlargement of the entire scrotum consistent with hydrocele; appears to be larger on the right than the left Testis: Unable to palpate     Results: None

## 2021-04-02 ENCOUNTER — Telehealth: Payer: Self-pay

## 2021-04-02 NOTE — Telephone Encounter (Signed)
Patients daughter called advising that patient wished to proceed with surgery. They wanted to make you aware.  ?

## 2021-04-02 NOTE — Telephone Encounter (Signed)
Ok I will call them and make them aware. ? ?Thank you!  ? ?

## 2021-04-05 ENCOUNTER — Encounter: Payer: Self-pay | Admitting: Urology

## 2021-04-05 ENCOUNTER — Ambulatory Visit (INDEPENDENT_AMBULATORY_CARE_PROVIDER_SITE_OTHER): Payer: Medicare Other | Admitting: Urology

## 2021-04-05 VITALS — BP 126/69 | HR 72

## 2021-04-05 DIAGNOSIS — N433 Hydrocele, unspecified: Secondary | ICD-10-CM

## 2021-04-05 NOTE — Progress Notes (Signed)
? ?Assessment: ?1. Hydrocele, bilateral; R>L   ? ? ?Plan: ?I reviewed the scrotal ultrasound from 11/22. ?Diagnosis and management of a hydrocele again discussed with the patient and his daughter today.  Surgical management with hydrocelectomy discussed.  Risks of procedure reviewed in detail.  Specifically, we discussed the increased risk of anesthesia given his advanced age.  I also discussed the risk of infection, bleeding, injury to scrotal structures, possible recurrence of the hydrocele, and need for additional procedures. ?He is interested in proceeding with bilateral hydrocelectomies. ?He will need pre-op medical clearance due to his advanced age. ? ?Procedure: ?The patient will be scheduled for bilateral hydrocelectomy at [facility].  Surgical request is placed with the surgery schedulers and will be scheduled at the patient's/family request. Informed consent is given as documented below. ?Anesthesia: Spinal ? ?The patient does not  have sleep apnea, history of MRSA, history of VRE, history of cardiac device requiring special anesthetic needs. ?Patient is stable and considered clear for surgical in an outpatient ambulatory surgery setting as well as patient hospital setting. ? ?Consent for Operation or Procedure: Provider Certification ?I hereby certify that the nature, purpose, benefits, usual and most frequent risks of, and alternatives to, the operation or procedure have been explained to the patient (or person authorized to sign for the patient) either by me as responsible physician or by the provider who is to perform the operation or procedure. Time spent such that the patient/family has had an opportunity to ask questions, and that those questions have been answered. The patient or the patient's representative has been advised that selected tasks may be performed by assistants to the primary health care provider(s). I believe that the patient (or person authorized to sign for the patient)  understands what has been explained, and has consented to the operation or procedure. No guarantees were implied or made. ? ?Chief Complaint:  ?Chief Complaint  ?Patient presents with  ? Hydrocele  ? ? ?History of Present Illness: ? ?Terrance Miller is a 88 y.o. year old male who is seen for further evaluation of large bilateral hydroceles.  He has a history of a scrotal hydrocele for a number of years.  This was previously evaluated with an ultrasound in 4/19 which showed a large left hydrocele and trace right hydrocele with normal testes bilaterally.  He was recently seen in the emergency room for a apparent increase in the size of the hydrocele.  He reports some difficulty with urination due to the hydrocele.  He does have some mild discomfort associated with a hydrocele.  He reports nocturia and urge incontinence.  No dysuria or gross hematuria.  He is currently on tamsulosin. ?AUA score = 16. ?Scrotal ultrasound from 11/15/2020 shows large bilateral hydroceles with debris in the right hydrocele, compression of the testicles by the hydrocele with normal blood flow. ?He was last seen in November 2022 at which time surgical management of his hydroceles was discussed. ? ?He returns today for follow-up.  He is interested in proceeding with surgical management. ?He continues to have urinary symptoms including urgency and frequency.  No dysuria or gross hematuria.  No change in the scrotal size. ? ?Portions of the above documentation were copied from a prior visit for review purposes only. ? ? ?Past Medical History:  ?Past Medical History:  ?Diagnosis Date  ? Acute anterior wall MI [facility]) 2004  ? Coronary artery disease   ? Diabetes mellitus without complication ([place])   ? Hyperlipidemia   ? Hypertension   ? ? ?  Past Surgical History:  ?Past Surgical History:  ?Procedure Laterality Date  ? CORONARY ANGIOPLASTY WITH STENT PLACEMENT  09/05/2002  ? stent RCA, 80% first diagonal, 70-80% mid-diagonal, 80% mid LAD stenosis  ? NM  MYOCAR PERF WALL MOTION  11/01/2008  ? Normal  ? ? ?Allergies:  ?No Known Allergies ? ?Family History:  ?Family History  ?Problem Relation Age of Onset  ? Hypertension Father   ? Diabetes Sister   ? ? ?Social History:  ?Social History  ? ?Tobacco Use  ? Smoking status: Former  ?  Types: Cigarettes  ? Smokeless tobacco: Never  ?Substance Use Topics  ? Alcohol use: Not Currently  ? Drug use: No  ? ? ?ROS: ?Constitutional:  Negative for fever, chills, weight loss ?CV: Negative for chest pain, previous MI, hypertension ?Respiratory:  Negative for shortness of breath, wheezing, sleep apnea, frequent cough ?GI:  Negative for nausea, vomiting, bloody stool, GERD ? ?Physical exam: ?BP 126/69   Pulse 72  ?GENERAL APPEARANCE:  Well appearing, well developed, well nourished, NAD ?HEENT: Atraumatic, Normocephalic, oropharynx clear. ?NECK: Supple without lymphadenopathy or thyromegaly. ?LUNGS: Clear to auscultation bilaterally. ?HEART: Regular Rate and Rhythm without murmurs, gallops, or rubs. ?ABDOMEN: Soft, non-tender, No Masses. ?EXTREMITIES: Moves all extremities well.  Without clubbing, cyanosis, or edema. ?NEUROLOGIC:  Alert and oriented x 3, uses cane, CN II-XII grossly intact.  ?MENTAL STATUS:  Appropriate. ?BACK:  Non-tender to palpation.  No CVAT ?SKIN:  Warm, dry and intact.   ?GU: ?Penis:  uncircumcised ?Meatus: Not visualized ?Scrotum: Significant enlargement of the entire scrotum consistent with hydroceles; appears to be larger on the right than the left ?Testis: Unable to palpate ? ?   ?Results: ?None ? ? ?

## 2021-04-23 ENCOUNTER — Telehealth: Payer: Self-pay

## 2021-04-23 NOTE — Telephone Encounter (Signed)
Received surgical posting for bilateral hydrocelectomy ? ?Per posting requires primary care medical clearance. Patient has new patient appointment scheduled to establish care- will send medical clearance form to Dr. Thersa Salt. ? ? ?

## 2021-04-29 ENCOUNTER — Ambulatory Visit (INDEPENDENT_AMBULATORY_CARE_PROVIDER_SITE_OTHER): Payer: Medicare Other | Admitting: Family Medicine

## 2021-04-29 VITALS — BP 173/76 | HR 52 | Temp 98.5°F | Ht 66.0 in | Wt 216.2 lb

## 2021-04-29 DIAGNOSIS — H9193 Unspecified hearing loss, bilateral: Secondary | ICD-10-CM

## 2021-04-29 DIAGNOSIS — I2510 Atherosclerotic heart disease of native coronary artery without angina pectoris: Secondary | ICD-10-CM

## 2021-04-29 DIAGNOSIS — I252 Old myocardial infarction: Secondary | ICD-10-CM | POA: Diagnosis not present

## 2021-04-29 DIAGNOSIS — I10 Essential (primary) hypertension: Secondary | ICD-10-CM | POA: Diagnosis not present

## 2021-04-29 DIAGNOSIS — E785 Hyperlipidemia, unspecified: Secondary | ICD-10-CM

## 2021-04-29 DIAGNOSIS — Z862 Personal history of diseases of the blood and blood-forming organs and certain disorders involving the immune mechanism: Secondary | ICD-10-CM

## 2021-04-29 DIAGNOSIS — R739 Hyperglycemia, unspecified: Secondary | ICD-10-CM

## 2021-04-29 MED ORDER — PRAVASTATIN SODIUM 40 MG PO TABS: 40.0000 mg | ORAL_TABLET | Freq: Every day | ORAL | 3 refills | Status: DC

## 2021-04-29 MED ORDER — METOPROLOL TARTRATE 25 MG PO TABS: 25.0000 mg | ORAL_TABLET | Freq: Two times a day (BID) | ORAL | 3 refills | Status: DC

## 2021-04-29 NOTE — Assessment & Plan Note (Signed)
BP elevated today.  Suspect that this is due to the fact that he has not taken his medication today.  Labs today.  Continue metoprolol and amlodipine. ?

## 2021-04-29 NOTE — Patient Instructions (Signed)
Labs this morning. ? ?Continue current medications. ? ?I have placed referral to cardiology. ? ?Follow up in 3 months. ? ?Take care ? ?Dr. Lacinda Axon  ?

## 2021-04-29 NOTE — Progress Notes (Signed)
? ?Subjective:  ?Patient ID: Terrance Miller, male    DOB: 1928/06/10  Age: 88 y.o. MRN: [id number] ? ?CC: ?Chief Complaint  ?Patient presents with  ? Establish Care  ?  Pt is seeing urology, Dr. Felipa Eth, will be sending papers to be filled out stating pt is ok to go through surgery.  ? ? ?HPI: ? ?88 year old male with hypertension, coronary artery disease with history of PCI, hyperlipidemia, hydrocele presents to establish care.  Patient is accompanied by his daughter today. ? ?Patient recently seen by urology.  Patient has bilateral hydroceles and surgery has been recommended.  It is not scheduled yet.  He will need surgical clearance. ? ?Patient has a history of coronary artery disease.  Previous MI in 2004.  Found to have total occlusion of RCA.  Underwent successful intervention with Cypher drug-eluting stent.  Patient has not seen a cardiologist in years.  Not currently having any chest pain or shortness of breath.  Will need to see cardiology in the near future.  We will need cardiology clearance prior to surgery. ? ?Patient has underlying hypertension.  Blood pressure elevated today.  He has not taken his medication today.  He is currently on metoprolol 25 mg twice daily as well as amlodipine 5 mg daily. ? ?Patient's hyperlipidemia is treated with pravastatin 40 mg daily.  Unsure of current status.  Needs labs. ? ?Patient is very hard of hearing.  Daughter would like referral to audiology for hearing aids. ? ?Patient Active Problem List  ? Diagnosis Date Noted  ? History of MI (myocardial infarction) 04/29/2021  ? Hydrocele 10/26/2020  ? Hypertension 09/28/2013  ? Hyperlipidemia LDL goal <70 09/28/2013  ? CAD (coronary artery disease) 09/28/2013  ? ? ?Social Hx   ?Social History  ? ?Socioeconomic History  ? Marital status: Widowed  ?  Spouse name: Not on file  ? Number of children: Not on file  ? Years of education: Not on file  ? Highest education level: Not on file  ?Occupational History  ? Not on file   ?Tobacco Use  ? Smoking status: Former  ?  Types: Cigarettes  ? Smokeless tobacco: Never  ?Substance and Sexual Activity  ? Alcohol use: Not Currently  ? Drug use: No  ? Sexual activity: Not on file  ?Other Topics Concern  ? Not on file  ?Social History Narrative  ? Not on file  ? ?Social Determinants of Health  ? ?Financial Resource Strain: Not on file  ?Food Insecurity: Not on file  ?Transportation Needs: Not on file  ?Physical Activity: Not on file  ?Stress: Not on file  ?Social Connections: Not on file  ? ? ?Review of Systems ?Per HPI ? ?Objective:  ?BP (!) 173/76   Pulse (!) 52   Temp 98.5 ?F (36.9 ?C) (Oral)   Ht _0  (1.676 m)   Wt 216 lb 3.2 oz (98.1 kg)   SpO2 100%   BMI 34.90 kg/m?  ? ? ?  04/29/2021  ? 10:36 AM 04/05/2021  ? 10:23 AM 11/20/2020  ?  2:44 PM  ?BP/Weight  ?Systolic BP [phone]  ?Diastolic BP 76 69 81  ?Wt. (Lbs) 216.2  221  ?BMI 34.9 kg/m2  35.67 kg/m2  ? ? ?Physical Exam ?Vitals and nursing note reviewed.  ?Constitutional:   ?   General: He is not in acute distress. ?   Appearance: He is obese.  ?HENT:  ?   Head: Normocephalic and atraumatic.  ?Eyes:  ?  General:     ?   Right eye: No discharge.     ?   Left eye: No discharge.  ?   Conjunctiva/sclera: Conjunctivae normal.  ?Cardiovascular:  ?   Rate and Rhythm: Regular rhythm. Bradycardia present.  ?Pulmonary:  ?   Effort: Pulmonary effort is normal.  ?   Breath sounds: Normal breath sounds. No wheezing or rales.  ?Abdominal:  ?   General: There is no distension.  ?   Palpations: Abdomen is soft.  ?   Tenderness: There is no abdominal tenderness.  ?Neurological:  ?   Mental Status: He is alert.  ?Psychiatric:     ?   Mood and Affect: Mood normal.     ?   Behavior: Behavior normal.  ? ? ?Lab Results  ?Component Value Date  ? WBC 5.4 05/21/2017  ? HGB 10.3 (L) 05/21/2017  ? HCT 30.9 (L) 05/21/2017  ? PLT 77 (L) 05/21/2017  ? GLUCOSE 110 (H) 05/25/2017  ? CHOL 153 09/28/2013  ? TRIG 79 09/28/2013  ? HDL 49 09/28/2013  ? [place] 88  09/28/2013  ? ALT 65 (H) 05/21/2017  ? AST 36 05/21/2017  ? NA 140 05/25/2017  ? K 4.0 05/25/2017  ? CL 111 05/25/2017  ? CREATININE 1.88 (H) 05/25/2017  ? BUN 24 (H) 05/25/2017  ? CO2 21 (L) 05/25/2017  ? TSH 2.439 05/21/2017  ? INR 1.06 05/20/2017  ? ? ? ?Assessment & Plan:  ? ?Problem List Items Addressed This Visit   ? ?  ? Cardiovascular and Mediastinum  ? Hypertension  ?  BP elevated today.  Suspect that this is due to the fact that he has not taken his medication today.  Labs today.  Continue metoprolol and amlodipine. ? ?  ?  ? Relevant Medications  ? metoprolol tartrate (LOPRESSOR) 25 MG tablet  ? pravastatin (PRAVACHOL) 40 MG tablet  ? Other Relevant Orders  ? CMP14+EGFR  ? CAD (coronary artery disease)  ?  Appears stable.  Labs today.  Referring to cardiology. ? ?  ?  ? Relevant Medications  ? metoprolol tartrate (LOPRESSOR) 25 MG tablet  ? pravastatin (PRAVACHOL) 40 MG tablet  ? Other Relevant Orders  ? Ambulatory referral to Cardiology  ?  ? Other  ? Hyperlipidemia LDL goal <70 - Primary  ?  Unsure of current status.  Labs today.  Continue pravastatin ? ?  ?  ? Relevant Medications  ? metoprolol tartrate (LOPRESSOR) 25 MG tablet  ? pravastatin (PRAVACHOL) 40 MG tablet  ? Other Relevant Orders  ? Lipid panel  ? History of MI (myocardial infarction)  ? ?Other Visit Diagnoses   ? ? History of anemia      ? Relevant Orders  ? CBC  ? Blood glucose elevated      ? Relevant Orders  ? Hemoglobin A1c  ? Bilateral hearing loss, unspecified hearing loss type      ? Relevant Orders  ? Ambulatory referral to ENT  ? ?  ? ? ?Meds ordered this encounter  ?Medications  ? metoprolol tartrate (LOPRESSOR) 25 MG tablet  ?  Sig: Take 1 tablet (25 mg total) by mouth 2 (two) times daily.  ?  Dispense:  180 tablet  ?  Refill:  3  ? pravastatin (PRAVACHOL) 40 MG tablet  ?  Sig: Take 1 tablet (40 mg total) by mouth daily.  ?  Dispense:  90 tablet  ?  Refill:  3  ? ? ?Follow-up: 3  to 6 months ? ?Thersa Salt DO ?[place] ? ?

## 2021-04-29 NOTE — Assessment & Plan Note (Signed)
Appears stable.  Labs today.  Referring to cardiology. ?

## 2021-04-29 NOTE — Assessment & Plan Note (Signed)
Unsure of current status.  Labs today.  Continue pravastatin ?

## 2021-05-01 LAB — CMP14+EGFR
ALT: 12 IU/L (ref 0–44)
AST: 15 IU/L (ref 0–40)
Albumin/Globulin Ratio: 1.3 (ref 1.2–2.2)
Albumin: 4.1 g/dL (ref 3.5–4.6)
Alkaline Phosphatase: 57 IU/L (ref 44–121)
BUN/Creatinine Ratio: 12 (ref 10–24)
BUN: 23 mg/dL (ref 10–36)
Bilirubin Total: 0.6 mg/dL (ref 0.0–1.2)
CO2: 18 mmol/L — ABNORMAL LOW (ref 20–29)
Calcium: 9 mg/dL (ref 8.6–10.2)
Chloride: 110 mmol/L — ABNORMAL HIGH (ref 96–106)
Creatinine, Ser: 1.88 mg/dL — ABNORMAL HIGH (ref 0.76–1.27)
Globulin, Total: 3.2 g/dL (ref 1.5–4.5)
Glucose: 103 mg/dL — ABNORMAL HIGH (ref 70–99)
Potassium: 4.5 mmol/L (ref 3.5–5.2)
Sodium: 146 mmol/L — ABNORMAL HIGH (ref 134–144)
Total Protein: 7.3 g/dL (ref 6.0–8.5)
eGFR: 34 mL/min/{1.73_m2} — ABNORMAL LOW (ref >59)

## 2021-05-01 LAB — LIPID PANEL
Chol/HDL Ratio: 3.1 ratio (ref 0.0–5.0)
Cholesterol, Total: 153 mg/dL (ref 100–199)
HDL: 49 mg/dL (ref >39)
LDL Chol Calc (NIH): 90 mg/dL (ref 0–99)
Triglycerides: 72 mg/dL (ref 0–149)
VLDL Cholesterol Cal: 14 mg/dL (ref 5–40)

## 2021-05-01 LAB — HEMOGLOBIN A1C
Est. average glucose Bld gHb Est-mCnc: 134 mg/dL
Hgb A1c MFr Bld: 6.3 % — ABNORMAL HIGH (ref 4.8–5.6)

## 2021-05-01 LAB — CBC
Hematocrit: 33.8 % — ABNORMAL LOW (ref 37.5–51.0)
Hemoglobin: 11.5 g/dL — ABNORMAL LOW (ref 13.0–17.7)
MCH: 34.6 pg — ABNORMAL HIGH (ref 26.6–33.0)
MCHC: 34 g/dL (ref 31.5–35.7)
MCV: 102 fL — ABNORMAL HIGH (ref 79–97)
NRBC: 1 % — ABNORMAL HIGH (ref 0–0)
Platelets: 151 10*3/uL (ref 150–450)
RBC: 3.32 x10E6/uL — ABNORMAL LOW (ref 4.14–5.80)
RDW: 15.6 % — ABNORMAL HIGH (ref 11.6–15.4)
WBC: 7.8 10*3/uL (ref 3.4–10.8)

## 2021-05-02 ENCOUNTER — Other Ambulatory Visit: Payer: Self-pay | Admitting: *Deleted

## 2021-05-02 DIAGNOSIS — I10 Essential (primary) hypertension: Secondary | ICD-10-CM

## 2021-05-02 DIAGNOSIS — Z862 Personal history of diseases of the blood and blood-forming organs and certain disorders involving the immune mechanism: Secondary | ICD-10-CM

## 2021-05-03 ENCOUNTER — Telehealth: Payer: Self-pay

## 2021-05-03 ENCOUNTER — Other Ambulatory Visit: Payer: Self-pay | Admitting: Family Medicine

## 2021-05-03 DIAGNOSIS — D649 Anemia, unspecified: Secondary | ICD-10-CM

## 2021-05-03 NOTE — Telephone Encounter (Signed)
Patient established care with PCP. ?Surgical clearance sent to PCP for hydrocelectomy- PCP has referred patient to cardiology.spoke with daughter today and referral. Patient scheduled to see cardiology in July.   ?

## 2021-05-31 ENCOUNTER — Inpatient Hospital Stay (HOSPITAL_COMMUNITY): Payer: Medicare Other | Attending: Hematology | Admitting: Hematology

## 2021-05-31 ENCOUNTER — Inpatient Hospital Stay (HOSPITAL_COMMUNITY): Payer: Medicare Other

## 2021-05-31 DIAGNOSIS — I129 Hypertensive chronic kidney disease with stage 1 through stage 4 chronic kidney disease, or unspecified chronic kidney disease: Secondary | ICD-10-CM | POA: Insufficient documentation

## 2021-05-31 DIAGNOSIS — D539 Nutritional anemia, unspecified: Secondary | ICD-10-CM | POA: Insufficient documentation

## 2021-05-31 DIAGNOSIS — E1122 Type 2 diabetes mellitus with diabetic chronic kidney disease: Secondary | ICD-10-CM | POA: Insufficient documentation

## 2021-05-31 DIAGNOSIS — N189 Chronic kidney disease, unspecified: Secondary | ICD-10-CM | POA: Insufficient documentation

## 2021-05-31 DIAGNOSIS — Z87891 Personal history of nicotine dependence: Secondary | ICD-10-CM | POA: Diagnosis not present

## 2021-05-31 DIAGNOSIS — Z79899 Other long term (current) drug therapy: Secondary | ICD-10-CM | POA: Diagnosis not present

## 2021-05-31 LAB — IRON AND TIBC
Iron: 118 ug/dL (ref 45–182)
Saturation Ratios: 47 % — ABNORMAL HIGH (ref 17.9–39.5)
TIBC: 250 ug/dL (ref 250–450)
UIBC: 132 ug/dL

## 2021-05-31 LAB — FOLATE: Folate: 8.2 ng/mL (ref >5.9)

## 2021-05-31 LAB — CBC WITH DIFFERENTIAL/PLATELET
Abs Immature Granulocytes: 0.03 10*3/uL (ref 0.00–0.07)
Basophils Absolute: 0.1 10*3/uL (ref 0.0–0.1)
Basophils Relative: 1 %
Eosinophils Absolute: 0.3 10*3/uL (ref 0.0–0.5)
Eosinophils Relative: 4 %
HCT: 31 % — ABNORMAL LOW (ref 39.0–52.0)
Hemoglobin: 10.2 g/dL — ABNORMAL LOW (ref 13.0–17.0)
Immature Granulocytes: 0 %
Lymphocytes Relative: 16 %
Lymphs Abs: 1.2 10*3/uL (ref 0.7–4.0)
MCH: 34.3 pg — ABNORMAL HIGH (ref 26.0–34.0)
MCHC: 32.9 g/dL (ref 30.0–36.0)
MCV: 104.4 fL — ABNORMAL HIGH (ref 80.0–100.0)
Monocytes Absolute: 0.8 10*3/uL (ref 0.1–1.0)
Monocytes Relative: 10 %
Neutro Abs: 5.5 10*3/uL (ref 1.7–7.7)
Neutrophils Relative %: 69 %
Platelets: 165 10*3/uL (ref 150–400)
RBC: 2.97 MIL/uL — ABNORMAL LOW (ref 4.22–5.81)
RDW: 17.1 % — ABNORMAL HIGH (ref 11.5–15.5)
WBC: 7.9 10*3/uL (ref 4.0–10.5)
nRBC: 0.9 % — ABNORMAL HIGH (ref 0.0–0.2)

## 2021-05-31 LAB — RETICULOCYTES
Immature Retic Fract: 24.8 % — ABNORMAL HIGH (ref 2.3–15.9)
RBC.: 2.98 MIL/uL — ABNORMAL LOW (ref 4.22–5.81)
Retic Count, Absolute: 62 10*3/uL (ref 19.0–186.0)
Retic Ct Pct: 2.1 % (ref 0.4–3.1)

## 2021-05-31 LAB — VITAMIN B12: Vitamin B-12: 316 pg/mL (ref 180–914)

## 2021-05-31 LAB — LACTATE DEHYDROGENASE: LDH: 125 U/L (ref 98–192)

## 2021-05-31 LAB — FERRITIN: Ferritin: 392 ng/mL — ABNORMAL HIGH (ref 24–336)

## 2021-05-31 NOTE — Progress Notes (Signed)
[place] [place], [place] [address]   CLINIC:  Medical Oncology/Hematology  Patient Care Team: Coral Spikes, DO as PCP - General (Family Medicine) Derek Jack, MD as Medical Oncologist (Hematology)  CHIEF COMPLAINTS/PURPOSE OF CONSULTATION:  Evaluation for macrocytic anemia  HISTORY OF PRESENTING ILLNESS:  Terrance Miller 88 y.o. male is here because of evaluation for macrocytic anemia, at the request of [facility].  Today he reports feeling good, and he is accompanied by his daughter. He denies SOB. He reports he is able to walk 200-300 yards with a walker or cane, and he is active at home. He denies current bleeding, black stools, fevers, night sweats, and weight loss over the past 6 months. He cannot recall if he has ever had a colonoscopy. He denies history of blood transfusions. His daughter reports his ankles are often swollen for which he wears compression socks.   He lives at home with his daughter. 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 He quit smoking 20 years ago after smoking less than 1/2 ppd. His 5 maternal aunts passed from cancer although he cannot recall what kind.   MEDICAL HISTORY:  Past Medical History:  Diagnosis Date   Acute anterior wall MI ([place]) 2004   Coronary artery disease    Diabetes mellitus without complication ([place])    Hyperlipidemia    Hypertension     SURGICAL HISTORY: Past Surgical History:  Procedure Laterality Date   CORONARY ANGIOPLASTY WITH STENT PLACEMENT  09/05/2002   stent RCA, 80% first diagonal, 70-80% mid-diagonal, 80% mid LAD stenosis   NM MYOCAR PERF WALL MOTION  11/01/2008   Normal    SOCIAL HISTORY: Social History   Socioeconomic History   Marital status: Widowed    Spouse name: Not on file   Number of children: Not on file   Years of education: Not on file   Highest education level: Not on file  Occupational  History   Not on file  Tobacco Use   Smoking status: Former    Types: Cigarettes   Smokeless tobacco: Never  Substance and Sexual Activity   Alcohol use: Not Currently   Drug use: No   Sexual activity: Not on file  Other Topics Concern   Not on file  Social History Narrative   Not on file   Social Determinants of Health   Financial Resource Strain: Not on file  Food Insecurity: Not on file  Transportation Needs: Not on file  Physical Activity: Not on file  Stress: Not on file  Social Connections: Not on file  Intimate Partner Violence: Not on file    FAMILY HISTORY: Family History  Problem Relation Age of Onset   Hypertension Father    Diabetes Sister     ALLERGIES:  has No Known Allergies.  MEDICATIONS:  Current Outpatient Medications  Medication Sig Dispense Refill   amLODipine (NORVASC) 5 MG tablet Take 1 tablet (5 mg total) by mouth daily. 30 tablet 6   cholecalciferol (VITAMIN D) 1000 units tablet Take 1,000 Units by mouth daily.     meclizine (ANTIVERT) 25 MG tablet Take 25 mg by mouth daily.     metoprolol tartrate (LOPRESSOR) 25 MG tablet Take 1 tablet (25 mg total) by mouth 2 (two) times daily. 180 tablet 3   pravastatin (PRAVACHOL) 40 MG tablet Take 1 tablet (40 mg total) by mouth daily. 90 tablet 3  tamsulosin (FLOMAX) 0.4 MG CAPS capsule Take 0.4 mg by mouth daily.     TRAVATAN Z 0.004 % SOLN ophthalmic solution Place 1 drop into both eyes 2 (two) times daily.     No current facility-administered medications for this visit.    REVIEW OF SYSTEMS:   Review of Systems  Constitutional:  Negative for appetite change, fatigue, fever and unexpected weight change.  HENT:   Negative for nosebleeds.   Respiratory:  Negative for hemoptysis and shortness of breath.   Cardiovascular:  Positive for leg swelling.  Gastrointestinal:  Negative for blood in stool.  Endocrine: Negative for hot flashes.  Genitourinary:  Negative for hematuria.   Neurological:   Positive for dizziness.  Hematological:  Does not bruise/bleed easily.  Psychiatric/Behavioral:  Positive for sleep disturbance.   All other systems reviewed and are negative.   PHYSICAL EXAMINATION: ECOG PERFORMANCE STATUS: 1 - Symptomatic but completely ambulatory  Vitals:   05/31/21 0924  BP: (!) 160/70  Pulse: 60  Resp: 16  Temp: (!) 97.5 F (36.4 C)  SpO2: 98%   Filed Weights   05/31/21 0924  Weight: 224 lb 14.4 oz (102 kg)   Physical Exam Vitals reviewed.  Constitutional:      Appearance: Normal appearance. He is obese.  Cardiovascular:     Rate and Rhythm: Normal rate and regular rhythm.     Pulses: Normal pulses.     Heart sounds: Normal heart sounds.  Pulmonary:     Effort: Pulmonary effort is normal.     Breath sounds: Normal breath sounds.  Musculoskeletal:     Right lower leg: 2+ Edema present.     Left lower leg: 2+ Edema present.  Lymphadenopathy:     Upper Body:     Right upper body: No supraclavicular or axillary adenopathy.     Left upper body: No supraclavicular or axillary adenopathy.  Neurological:     General: No focal deficit present.     Mental Status: He is alert and oriented to person, place, and time.  Psychiatric:        Mood and Affect: Mood normal.        Behavior: Behavior normal.     LABORATORY DATA:  I have reviewed the data as listed Recent Results (from the past 2160 hour(s))  CBC     Status: Abnormal   Collection Time: 04/29/21 11:17 AM  Result Value Ref Range   WBC 7.8 3.4 - 10.8 x10E3/uL   RBC 3.32 (L) 4.14 - 5.80 x10E6/uL   Hemoglobin 11.5 (L) 13.0 - 17.7 g/dL   Hematocrit 33.8 (L) 37.5 - 51.0 %   MCV 102 (H) 79 - 97 fL   MCH 34.6 (H) 26.6 - 33.0 pg   MCHC 34.0 31.5 - 35.7 g/dL   RDW 15.6 (H) 11.6 - 15.4 %   Platelets 151 150 - 450 x10E3/uL   NRBC 1 (H) 0 - 0 %  CMP14+EGFR     Status: Abnormal   Collection Time: 04/29/21 11:17 AM  Result Value Ref Range   Glucose 103 (H) 70 - 99 mg/dL   BUN 23 10 - 36 mg/dL    Creatinine, Ser 1.88 (H) 0.76 - 1.27 mg/dL   eGFR 34 (L) >59 mL/min/1.73   BUN/Creatinine Ratio 12 10 - 24   Sodium 146 (H) 134 - 144 mmol/L   Potassium 4.5 3.5 - 5.2 mmol/L   Chloride 110 (H) 96 - 106 mmol/L   CO2 18 (L) 20 - 29 mmol/L  Calcium 9.0 8.6 - 10.2 mg/dL   Total Protein 7.3 6.0 - 8.5 g/dL   Albumin 4.1 3.5 - 4.6 g/dL   Globulin, Total 3.2 1.5 - 4.5 g/dL   Albumin/Globulin Ratio 1.3 1.2 - 2.2   Bilirubin Total 0.6 0.0 - 1.2 mg/dL   Alkaline Phosphatase 57 44 - 121 IU/L   AST 15 0 - 40 IU/L   ALT 12 0 - 44 IU/L  Lipid panel     Status: None   Collection Time: 04/29/21 11:17 AM  Result Value Ref Range   Cholesterol, Total 153 100 - 199 mg/dL   Triglycerides 72 0 - 149 mg/dL   HDL 49 >39 mg/dL   VLDL Cholesterol Cal 14 5 - 40 mg/dL   LDL Chol Calc (NIH) 90 0 - 99 mg/dL   Chol/HDL Ratio 3.1 0.0 - 5.0 ratio    Comment:                                   T. Chol/HDL Ratio                                             Men  Women                               1/2 Avg.Risk  3.4    3.3                                   Avg.Risk  5.0    4.4                                2X Avg.Risk  9.6    7.1                                3X Avg.Risk 23.4   11.0   Hemoglobin A1c     Status: Abnormal   Collection Time: 04/29/21 11:17 AM  Result Value Ref Range   Hgb A1c MFr Bld 6.3 (H) 4.8 - 5.6 %    Comment:          Prediabetes: 5.7 - 6.4          Diabetes: >6.4          Glycemic control for adults with diabetes: <7.0    Est. average glucose Bld gHb Est-mCnc 134 mg/dL    RADIOGRAPHIC STUDIES: I have personally reviewed the radiological images as listed and agreed with the findings in the report. No results found.  ASSESSMENT:  Macrocytic anemia: - Patient seen at the request of Dr. Lacinda Axon for further work-up of microcytic anemia. - His hemoglobin was ranging between 10-11.5 since 2019. - He also has CKD with creatinine around 1.8-2.0. - Denies any bleeding per rectum or melena.  No  prior history of transfusions.  He never had a colonoscopy.   Social/family history: - Lives at home with his daughter.  He walks with the help of cane and walker.  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  Quit smoking 20  years ago. - 5 of his maternal aunts died of cancer.  Patient does not know the types.   PLAN:  Macrocytic anemia: - Etiology of microcytic anemia includes CKD, relative iron deficiency, and early MDS. - We will repeat CBC, will check for nutritional deficiencies, hemolysis, bone marrow infiltrative process including SPEP and LGL leukemia by flow cytometry. - RTC 2 to 3 weeks for follow-up to discuss results.   All questions were answered. The patient knows to call the clinic with any problems, questions or concerns.  Derek Jack, MD 05/31/21 10:07 AM  [place] [phone]   I, Thana Ates, am acting as a scribe for Dr. Derek Jack.  I, Derek Jack MD, have reviewed the above documentation for accuracy and completeness, and I agree with the above.

## 2021-05-31 NOTE — Patient Instructions (Signed)
[place] at [facility] Discharge Instructions  You were seen and examined today by Dr. Delton Coombes. Dr. Delton Coombes is a hematologist, meaning that he specializes in blood abnormalities. Dr. Delton Coombes discussed your past medical history, family history of cancers/blood conditions and the events that led to you being here today.  You were referred to Dr. Delton Coombes by Dr. Lacinda Axon due to anemia (low blood counts). Dr. Delton Coombes has recommended additional lab work today in an attempt to assess the cause of your anemia.  Follow-up as scheduled.   Thank you for choosing [place] at [facility] to provide your oncology and hematology care.  To afford each patient quality time with our provider, please arrive at least 15 minutes before your scheduled appointment time.   If you have a lab appointment with the [facility] please come in thru the Main Entrance and check in at the main information desk.  You need to re-schedule your appointment should you arrive 10 or more minutes late.  We strive to give you quality time with our providers, and arriving late affects you and other patients whose appointments are after yours.  Also, if you no show three or more times for appointments you may be dismissed from the clinic at the providers discretion.     Again, thank you for choosing [facility].  Our hope is that these requests will decrease the amount of time that you wait before being seen by our physicians.       _____________________________________________________________  Should you have questions after your visit to [facility], please contact our office at [phone] and follow the prompts.  Our office hours are 8:00 a.m. and 4:30 p.m. Monday - Friday.  Please note that voicemails left after 4:00 p.m. may not be returned until the following business day.  We are closed weekends and major holidays.  You do have access to a  nurse 24-7, just call the main number to the clinic [phone] and do not press any options, hold on the line and a nurse will answer the phone.    For prescription refill requests, have your pharmacy contact our office and allow 72 hours.    Due to Covid, you will need to wear a mask upon entering the hospital. If you do not have a mask, a mask will be given to you at the Main Entrance upon arrival. For doctor visits, patients may have 1 support person age 59 or older with them. For treatment visits, patients can not have anyone with them due to social distancing guidelines and our immunocompromised population.

## 2021-06-04 LAB — PROTEIN ELECTROPHORESIS, SERUM
A/G Ratio: 1.1 (ref 0.7–1.7)
Albumin ELP: 3.5 g/dL (ref 2.9–4.4)
Alpha-1-Globulin: 0.2 g/dL (ref 0.0–0.4)
Alpha-2-Globulin: 0.6 g/dL (ref 0.4–1.0)
Beta Globulin: 0.8 g/dL (ref 0.7–1.3)
Gamma Globulin: 1.6 g/dL (ref 0.4–1.8)
Globulin, Total: 3.2 g/dL (ref 2.2–3.9)
M-Spike, %: 0.8 g/dL — ABNORMAL HIGH
Total Protein ELP: 6.7 g/dL (ref 6.0–8.5)

## 2021-06-04 LAB — SURGICAL PATHOLOGY

## 2021-06-05 LAB — COPPER, SERUM: Copper: 90 ug/dL (ref 69–132)

## 2021-06-05 LAB — METHYLMALONIC ACID, SERUM: Methylmalonic Acid, Quantitative: 400 nmol/L — ABNORMAL HIGH (ref 0–378)

## 2021-06-14 LAB — FLOW CYTOMETRY

## 2021-06-18 NOTE — Progress Notes (Unsigned)
[address]  [facility], [place] [address]   CLINIC:  Medical Oncology/Hematology  PCP:  Coral Spikes, DO [place] [address] [phone]   REASON FOR VISIT:  Follow-up for macrocytic anemia  PRIOR THERAPY: None  CURRENT THERAPY: Under work-up  INTERVAL HISTORY:  Terrance Miller 88 y.o. male returns for routine follow-up of his normocytic anemia.  He was seen for initial consultation by Dr. Delton Coombes on 05/31/2021.  At today's visit, he reports feeling fairly well.  He has not had any changes in his health since his initial visit with Dr. Delton Coombes last month.  He denies any source of blood loss such as epistaxis, hematemesis, hematochezia, or melena.  He reports that he has intermittent fatigue and does feel a bit more tired than usual.  He denies any fever, chills, night sweats, unintentional weight loss.  He has not noticed any new bone pain, but does have chronic intermittent back pain.  He reports intermittent neuropathy of his legs, occasional tinnitus, and blurry vision that comes and goes.  He has 80% energy and 100% appetite. He endorses that he is maintaining a stable weight.   REVIEW OF SYSTEMS:  Review of Systems  Constitutional:  Positive for fatigue. Negative for appetite change, chills, diaphoresis, fever and unexpected weight change.  HENT:   Negative for lump/mass and nosebleeds.   Eyes:  Negative for eye problems.  Respiratory:  Negative for cough, hemoptysis and shortness of breath.   Cardiovascular:  Negative for chest pain, leg swelling and palpitations.  Gastrointestinal:  Positive for constipation and nausea. Negative for abdominal pain, blood in stool, diarrhea and vomiting.  Genitourinary:  Negative for hematuria.   Musculoskeletal:  Positive for back pain.  Skin: Negative.   Neurological:  Positive for dizziness. Negative for headaches and light-headedness.  Hematological:  Does not bruise/bleed easily.   Psychiatric/Behavioral:  Positive for sleep disturbance.       PAST MEDICAL/SURGICAL HISTORY:  Past Medical History:  Diagnosis Date   Acute anterior wall MI ([facility]) 2004   Coronary artery disease    Diabetes mellitus without complication ([facility])    Hyperlipidemia    Hypertension    Past Surgical History:  Procedure Laterality Date   CORONARY ANGIOPLASTY WITH STENT PLACEMENT  09/05/2002   stent RCA, 80% first diagonal, 70-80% mid-diagonal, 80% mid LAD stenosis   NM MYOCAR PERF WALL MOTION  11/01/2008   Normal     SOCIAL HISTORY:  Social History   Socioeconomic History   Marital status: Widowed    Spouse name: Not on file   Number of children: Not on file   Years of education: Not on file   Highest education level: Not on file  Occupational History   Not on file  Tobacco Use   Smoking status: Former    Types: Cigarettes   Smokeless tobacco: Never  Substance and Sexual Activity   Alcohol use: Not Currently   Drug use: No   Sexual activity: Not on file  Other Topics Concern   Not on file  Social History Narrative   Not on file   Social Determinants of Health   Financial Resource Strain: Not on file  Food Insecurity: Not on file  Transportation Needs: Not on file  Physical Activity: Not on file  Stress: Not on file  Social Connections: Not on file  Intimate Partner Violence: Not on file    FAMILY HISTORY:  Family History  Problem Relation Age  of Onset   Hypertension Father    Diabetes Sister     CURRENT MEDICATIONS:  Outpatient Encounter Medications as of 06/19/2021  Medication Sig Note   amLODipine (NORVASC) 5 MG tablet Take 1 tablet (5 mg total) by mouth daily. 04/17/2017: Per patient and patient's family, patient takes this and the '10mg'$    cholecalciferol (VITAMIN D) 1000 units tablet Take 1,000 Units by mouth daily.    meclizine (ANTIVERT) 25 MG tablet Take 25 mg by mouth daily.    metoprolol tartrate (LOPRESSOR) 25 MG tablet Take 1 tablet (25 mg total)  by mouth 2 (two) times daily.    pravastatin (PRAVACHOL) 40 MG tablet Take 1 tablet (40 mg total) by mouth daily.    tamsulosin (FLOMAX) 0.4 MG CAPS capsule Take 0.4 mg by mouth daily.    TRAVATAN Z 0.004 % SOLN ophthalmic solution Place 1 drop into both eyes 2 (two) times daily.    No facility-administered encounter medications on file as of 06/19/2021.    ALLERGIES:  No Known Allergies   PHYSICAL EXAM:  ECOG PERFORMANCE STATUS: 2 - Symptomatic, <50% confined to bed  There were no vitals filed for this visit. There were no vitals filed for this visit. Physical Exam Constitutional:      Appearance: Normal appearance. He is obese.     Comments: Presents in wheelchair.  HENT:     Head: Normocephalic and atraumatic.     Mouth/Throat:     Mouth: Mucous membranes are moist.  Eyes:     Extraocular Movements: Extraocular movements intact.     Pupils: Pupils are equal, round, and reactive to light.  Cardiovascular:     Rate and Rhythm: Normal rate and regular rhythm.     Pulses: Normal pulses.     Heart sounds: Normal heart sounds.  Pulmonary:     Effort: Pulmonary effort is normal.     Breath sounds: Normal breath sounds.  Abdominal:     General: Bowel sounds are normal.     Palpations: Abdomen is soft.     Tenderness: There is no abdominal tenderness.  Musculoskeletal:        General: No swelling.     Right lower leg: Edema (1+ ankle edema) present.     Left lower leg: Edema (1+ ankle edema) present.  Lymphadenopathy:     Cervical: No cervical adenopathy.  Skin:    General: Skin is warm and dry.  Neurological:     General: No focal deficit present.     Mental Status: He is alert and oriented to person, place, and time.  Psychiatric:        Mood and Affect: Mood normal.        Behavior: Behavior normal.      LABORATORY DATA:  I have reviewed the labs as listed.  CBC    Component Value Date/Time   WBC 7.9 05/31/2021 1031   RBC 2.97 (L) 05/31/2021 1031   RBC 2.98  (L) 05/31/2021 1002   HGB 10.2 (L) 05/31/2021 1031   HGB 11.5 (L) 04/29/2021 1117   HCT 31.0 (L) 05/31/2021 1031   HCT 33.8 (L) 04/29/2021 1117   PLT 165 05/31/2021 1031   PLT 151 04/29/2021 1117   MCV 104.4 (H) 05/31/2021 1031   MCV 102 (H) 04/29/2021 1117   MCH 34.3 (H) 05/31/2021 1031   MCHC 32.9 05/31/2021 1031   RDW 17.1 (H) 05/31/2021 1031   RDW 15.6 (H) 04/29/2021 1117   LYMPHSABS 1.2 05/31/2021 1031  MONOABS 0.8 05/31/2021 1031   EOSABS 0.3 05/31/2021 1031   BASOSABS 0.1 05/31/2021 1031      Latest Ref Rng & Units 04/29/2021   11:17 AM 05/25/2017    5:12 AM 05/22/2017    1:49 PM  CMP  Glucose 70 - 99 mg/dL 103  110  155   BUN 10 - 36 mg/dL 23  24  33   Creatinine 0.76 - 1.27 mg/dL 1.88  1.88  2.39   Sodium 134 - 144 mmol/L 146  140  140   Potassium 3.5 - 5.2 mmol/L 4.5  4.0  4.5   Chloride 96 - 106 mmol/L 110  111  113   CO2 20 - 29 mmol/L '18  21  21   '$ Calcium 8.6 - 10.2 mg/dL 9.0  8.4  8.6   Total Protein 6.0 - 8.5 g/dL 7.3     Total Bilirubin 0.0 - 1.2 mg/dL 0.6     Alkaline Phos 44 - 121 IU/L 57     AST 0 - 40 IU/L 15     ALT 0 - 44 IU/L 12       DIAGNOSTIC IMAGING:  I have independently reviewed the relevant imaging and discussed with the patient.  ASSESSMENT & PLAN: 1.  Macrocytic anemia - Patient seen at the request of Dr. Lacinda Axon for further work-up of microcytic anemia. - His hemoglobin was ranging between 10-11.5 since 2019. - He also has CKD with creatinine around 1.8-2.0. - No prior history of transfusions.  He never had a colonoscopy. - Hematology work-up (05/31/2021): Flow cytometry nondiagnostic for lymphoproliferative process SPEP with M spike 0.8 Normal copper and folate.  Elevated ferritin 392, iron saturation 47%. Normal B12, but mildly elevated methylmalonic acid 400. Reticulocytes 2.1%, normal LDH 125. - Most recent CBC (05/31/2021): Hgb 10.2/MCV 104.4, normal platelets and WBC. - Denies any bleeding per rectum or melena. - He denies any B  symptoms or new bone pain.  He does have intermittent fatigue, neuropathy of his legs, tinnitus, and blurry vision. - Differential diagnosis includes anemia of CKD versus bone marrow infiltrative process (including MDS or myeloma), or mild age-related anemia.  Unclear etiology of macrocytosis at this time, possibly related to mild B12 deficiency versus alcohol liver disease versus MDS or other infiltrative process. - PLAN:  Additional work-up for MGUS/myeloma with immunofixation, free light chains, beta-2 microglobulin, 24-hour urine/UPEP, and skeletal survey. - Recommend starting vitamin B12 500 mcg daily due to elevated methylmalonic acid. - RTC in 3 weeks to discuss results and next steps.  2.  Social/family history: - Lives at home with his daughter.  He walks with the help of cane and walker.  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  Quit smoking 20 years ago. - 5 of his maternal aunts died of cancer.  Patient does not know the types.  All questions were answered. The patient knows to call the clinic with any problems, questions or concerns.  Medical decision making: Moderate  Time spent on visit: I spent 20 minutes counseling the patient face to face. The total time spent in the appointment was 30 minutes and more than 50% was on counseling.   Harriett Rush, PA-C  06/19/2021 11:21 PM

## 2021-06-19 ENCOUNTER — Inpatient Hospital Stay (HOSPITAL_COMMUNITY): Payer: Medicare Other | Attending: Hematology | Admitting: Physician Assistant

## 2021-06-19 ENCOUNTER — Ambulatory Visit (HOSPITAL_COMMUNITY)
Admission: RE | Admit: 2021-06-19 | Discharge: 2021-06-19 | Disposition: A | Discharge status: Home / Self Care | Payer: Medicare Other | Source: Ambulatory Visit | Attending: Physician Assistant | Admitting: Physician Assistant

## 2021-06-19 ENCOUNTER — Inpatient Hospital Stay (HOSPITAL_COMMUNITY): Payer: Medicare Other

## 2021-06-19 VITALS — BP 173/78 | HR 50 | Temp 97.2°F | Resp 20 | Ht 65.35 in | Wt 217.6 lb

## 2021-06-19 DIAGNOSIS — H9319 Tinnitus, unspecified ear: Secondary | ICD-10-CM | POA: Insufficient documentation

## 2021-06-19 DIAGNOSIS — H538 Other visual disturbances: Secondary | ICD-10-CM | POA: Insufficient documentation

## 2021-06-19 DIAGNOSIS — G629 Polyneuropathy, unspecified: Secondary | ICD-10-CM | POA: Insufficient documentation

## 2021-06-19 DIAGNOSIS — D649 Anemia, unspecified: Secondary | ICD-10-CM | POA: Diagnosis not present

## 2021-06-19 DIAGNOSIS — M549 Dorsalgia, unspecified: Secondary | ICD-10-CM | POA: Insufficient documentation

## 2021-06-19 DIAGNOSIS — D472 Monoclonal gammopathy: Secondary | ICD-10-CM | POA: Insufficient documentation

## 2021-06-19 DIAGNOSIS — Z87891 Personal history of nicotine dependence: Secondary | ICD-10-CM | POA: Diagnosis not present

## 2021-06-19 DIAGNOSIS — N189 Chronic kidney disease, unspecified: Secondary | ICD-10-CM | POA: Insufficient documentation

## 2021-06-19 DIAGNOSIS — Z79899 Other long term (current) drug therapy: Secondary | ICD-10-CM | POA: Diagnosis not present

## 2021-06-19 LAB — COMPREHENSIVE METABOLIC PANEL
ALT: 16 U/L (ref 0–44)
AST: 20 U/L (ref 15–41)
Albumin: 3.6 g/dL (ref 3.5–5.0)
Alkaline Phosphatase: 51 U/L (ref 38–126)
Anion gap: 6 (ref 5–15)
BUN: 23 mg/dL (ref 8–23)
CO2: 22 mmol/L (ref 22–32)
Calcium: 9 mg/dL (ref 8.9–10.3)
Chloride: 115 mmol/L — ABNORMAL HIGH (ref 98–111)
Creatinine, Ser: 1.67 mg/dL — ABNORMAL HIGH (ref 0.61–1.24)
GFR, Estimated: 39 mL/min — ABNORMAL LOW (ref >60)
Glucose, Bld: 114 mg/dL — ABNORMAL HIGH (ref 70–99)
Potassium: 4.2 mmol/L (ref 3.5–5.1)
Sodium: 143 mmol/L (ref 135–145)
Total Bilirubin: 0.8 mg/dL (ref 0.3–1.2)
Total Protein: 7.7 g/dL (ref 6.5–8.1)

## 2021-06-19 LAB — CBC WITH DIFFERENTIAL/PLATELET
Abs Immature Granulocytes: 0.03 10*3/uL (ref 0.00–0.07)
Basophils Absolute: 0.1 10*3/uL (ref 0.0–0.1)
Basophils Relative: 1 %
Eosinophils Absolute: 0.4 10*3/uL (ref 0.0–0.5)
Eosinophils Relative: 6 %
HCT: 35.3 % — ABNORMAL LOW (ref 39.0–52.0)
Hemoglobin: 11.5 g/dL — ABNORMAL LOW (ref 13.0–17.0)
Immature Granulocytes: 0 %
Lymphocytes Relative: 24 %
Lymphs Abs: 1.8 10*3/uL (ref 0.7–4.0)
MCH: 34.7 pg — ABNORMAL HIGH (ref 26.0–34.0)
MCHC: 32.6 g/dL (ref 30.0–36.0)
MCV: 106.6 fL — ABNORMAL HIGH (ref 80.0–100.0)
Monocytes Absolute: 0.9 10*3/uL (ref 0.1–1.0)
Monocytes Relative: 12 %
Neutro Abs: 4.1 10*3/uL (ref 1.7–7.7)
Neutrophils Relative %: 57 %
Platelets: 125 10*3/uL — ABNORMAL LOW (ref 150–400)
RBC: 3.31 MIL/uL — ABNORMAL LOW (ref 4.22–5.81)
RDW: 18.1 % — ABNORMAL HIGH (ref 11.5–15.5)
WBC: 7.3 10*3/uL (ref 4.0–10.5)
nRBC: 1.1 % — ABNORMAL HIGH (ref 0.0–0.2)

## 2021-06-19 LAB — LACTATE DEHYDROGENASE: LDH: 145 U/L (ref 98–192)

## 2021-06-19 NOTE — Patient Instructions (Signed)
[facility] Discharge Instructions  You were seen today by Tarri Abernethy PA-C for your anemia (low blood counts).  The labs that we checked last month showed mild anemia.  This may be related to your chronic kidney disease, but your labs also showed an abnormal protein.  We will check additional labs to look at the type of this abnormal protein and see if it is causing any problems.  We will also check urine studies and x-rays to look for problems that may have been caused by this abnormal protein.  Your vitamin B12 levels were slightly low, so I would like you to start taking vitamin B12 500 mcg daily, which is available over-the-counter.  LABS: Check labs today before leaving the hospital building.  OTHER TESTS: Check x-rays today before leaving the hospital building.  Check 24-hour urine study and return to the hospital (fourth floor) within 1 day of collecting your sample.  MEDICATIONS: Start taking B12.  FOLLOW-UP APPOINTMENT: Office visit in 3 weeks to discuss results of additional testing.   Thank you for choosing [facility] at [facility] to provide your oncology and hematology care.  To afford each patient quality time with our provider, please arrive at least 15 minutes before your scheduled appointment time.   If you have a lab appointment with the [facility] please come in thru the Main Entrance and check in at the main information desk.  You need to re-schedule your appointment should you arrive 10 or more minutes late.  We strive to give you quality time with our providers, and arriving late affects you and other patients whose appointments are after yours.  Also, if you no show three or more times for appointments you may be dismissed from the clinic at the providers discretion.     Again, thank you for choosing [facility].  Our hope is that these requests will decrease the amount of time that you  wait before being seen by our physicians.       _____________________________________________________________  Should you have questions after your visit to [facility], please contact our office at [phone] and follow the prompts.  Our office hours are 8:00 a.m. and 4:30 p.m. Monday - Friday.  Please note that voicemails left after 4:00 p.m. may not be returned until the following business day.  We are closed weekends and major holidays.  You do have access to a nurse 24-7, just call the main number to the clinic [phone] and do not press any options, hold on the line and a nurse will answer the phone.    For prescription refill requests, have your pharmacy contact our office and allow 72 hours.    Due to Covid, you will need to wear a mask upon entering the hospital. If you do not have a mask, a mask will be given to you at the Main Entrance upon arrival. For doctor visits, patients may have 1 support person age 59 or older with them. For treatment visits, patients can not have anyone with them due to social distancing guidelines and our immunocompromised population.

## 2021-06-20 LAB — KAPPA/LAMBDA LIGHT CHAINS
Kappa free light chain: 49.2 mg/L — ABNORMAL HIGH (ref 3.3–19.4)
Kappa, lambda light chain ratio: 1.86 — ABNORMAL HIGH (ref 0.26–1.65)
Lambda free light chains: 26.5 mg/L — ABNORMAL HIGH (ref 5.7–26.3)

## 2021-06-20 LAB — BETA 2 MICROGLOBULIN, SERUM: Beta-2 Microglobulin: 2.8 mg/L — ABNORMAL HIGH (ref 0.6–2.4)

## 2021-06-25 LAB — IMMUNOFIXATION ELECTROPHORESIS
IgA: 87 mg/dL (ref 61–437)
IgG (Immunoglobin G), Serum: 1989 mg/dL — ABNORMAL HIGH (ref 603–1613)
IgM (Immunoglobulin M), Srm: 55 mg/dL (ref 15–143)
Total Protein ELP: 7.1 g/dL (ref 6.0–8.5)

## 2021-06-28 ENCOUNTER — Other Ambulatory Visit (HOSPITAL_COMMUNITY)
Admission: RE | Admit: 2021-06-28 | Discharge: 2021-06-28 | Disposition: A | Discharge status: Home / Self Care | Payer: Medicare Other | Source: Ambulatory Visit | Attending: Hematology | Admitting: Hematology

## 2021-06-28 ENCOUNTER — Other Ambulatory Visit (HOSPITAL_COMMUNITY): Payer: Self-pay | Admitting: *Deleted

## 2021-06-28 DIAGNOSIS — D472 Monoclonal gammopathy: Secondary | ICD-10-CM

## 2021-07-01 LAB — UPEP/UIFE/LIGHT CHAINS/TP, 24-HR UR
% BETA, Urine: 13.7 %
ALPHA 1 URINE: 1.4 %
Albumin, U: 71.7 %
Alpha 2, Urine: 3.8 %
Free Kappa Lt Chains,Ur: 36.23 mg/L (ref 1.17–86.46)
Free Kappa/Lambda Ratio: 10.78 (ref 1.83–14.26)
Free Lambda Lt Chains,Ur: 3.36 mg/L (ref 0.27–15.21)
GAMMA GLOBULIN URINE: 9.3 %
M-SPIKE %, Urine: 3.4 % — ABNORMAL HIGH
M-Spike, Mg/24 Hr: 15 mg/24 hr — ABNORMAL HIGH
Total Protein, Urine-Ur/day: 429 mg/24 hr — ABNORMAL HIGH (ref 30–150)
Total Protein, Urine: 28.6 mg/dL
Total Volume: 1500

## 2021-07-09 NOTE — Progress Notes (Deleted)
NO SHOW

## 2021-07-10 ENCOUNTER — Inpatient Hospital Stay (HOSPITAL_COMMUNITY): Payer: Medicare Other | Attending: Hematology | Admitting: Physician Assistant

## 2021-07-15 ENCOUNTER — Ambulatory Visit (INDEPENDENT_AMBULATORY_CARE_PROVIDER_SITE_OTHER): Payer: Medicare Other | Admitting: Physician Assistant

## 2021-07-15 VITALS — BP 167/75 | HR 55

## 2021-07-15 DIAGNOSIS — N433 Hydrocele, unspecified: Secondary | ICD-10-CM | POA: Diagnosis not present

## 2021-07-15 DIAGNOSIS — N5082 Scrotal pain: Secondary | ICD-10-CM | POA: Diagnosis not present

## 2021-07-15 DIAGNOSIS — R3915 Urgency of urination: Secondary | ICD-10-CM | POA: Diagnosis not present

## 2021-07-15 LAB — BLADDER SCAN AMB NON-IMAGING: Scan Result: 30

## 2021-07-15 NOTE — Progress Notes (Signed)
Assessment: 1. Scrotal pain - US SCROTUM W/DOPPLER  2. Urinary urgency  3. Hydrocele, unspecified hydrocele type - Urinalysis, Routine w reflex microscopic - BLADDER SCAN AMB NON-IMAGING - US SCROTUM W/DOPPLER    Plan: Pt's daughter will drop urine off tomorrow morning and Dr. Felipa Eth will review results. Will Rx empiric tx for possible epididymitis after urine drop-off.  Will order culture if indicated by results.  Repeat scrotal ultrasound ordered to evaluate new onset of increased swelling and pain. Strict ED precautions discussed.  The patient's daughter conveys understanding.  Chief Complaint: No chief complaint on file.   HPI: Terrance Miller is a 88 y.o. male who presents for continued evaluation of bilateral hydroceles with new onset pain and increased swelling left side of scrotum x 2 days.  Cardiac clearance is scheduled for later this month for bilateral hydrocelectomy.  Patient states he has had a significant increase in swelling on the left side of his scrotum with pain that he has never had before.  Pt also c/o increase urinary frequency and urgency with low back pain.  He has had no fever, chills, nausea vomiting.  Denies abdominal pain.  No gross hematuria.  He continues prescription for tamsulosin. Patient is unable to void stating that he voided just before he left home today.  PVR = 30 mL Patient's history is assisted by his daughter who was present with him today.  04/05/21 Terrance Miller is a 88 y.o. year old male who is seen for further evaluation of large bilateral hydroceles.  He has a history of a scrotal hydrocele for a number of years.  This was previously evaluated with an ultrasound in 4/19 which showed a large left hydrocele and trace right hydrocele with normal testes bilaterally.  He was recently seen in the emergency room for a apparent increase in the size of the hydrocele.  He reports some difficulty with urination due to the hydrocele.  He does have some  mild discomfort associated with a hydrocele.  He reports nocturia and urge incontinence.  No dysuria or gross hematuria.  He is currently on tamsulosin. AUA score = 16. Scrotal ultrasound from 11/15/2020 shows large bilateral hydroceles with debris in the right hydrocele, compression of the testicles by the hydrocele with normal blood flow. He was last seen in November 2022 at which time surgical management of his hydroceles was discussed.   He returns today for follow-up.  He is interested in proceeding with surgical management. He continues to have urinary symptoms including urgency and frequency.  No dysuria or gross hematuria.  No change in the scrotal size.  Portions of the above documentation were copied from a prior visit for review purposes only.  Allergies: No Known Allergies  PMH: Past Medical History:  Diagnosis Date   Acute anterior wall MI ([place]) 2004   Coronary artery disease    Diabetes mellitus without complication ([place])    Hyperlipidemia    Hypertension     PSH: Past Surgical History:  Procedure Laterality Date   CORONARY ANGIOPLASTY WITH STENT PLACEMENT  09/05/2002   stent RCA, 80% first diagonal, 70-80% mid-diagonal, 80% mid LAD stenosis   NM MYOCAR PERF WALL MOTION  11/01/2008   Normal    SH: Social History   Tobacco Use   Smoking status: Former    Types: Cigarettes   Smokeless tobacco: Never  Substance Use Topics   Alcohol use: Not Currently   Drug use: No    ROS: All other review of systems were  reviewed and are negative except what is noted above in HPI  PE: BP (!) 167/75   Pulse (!) 55  GENERAL APPEARANCE:  Well appearing, well developed, well nourished, NAD HEENT:  Atraumatic, normocephalic patient is hard of hearing, missing dentition noted NECK:  Supple. Trachea midline ABDOMEN:  Soft, non-tender, no masses GU: Scrotal exam reveals tense swelling consistent with previous diagnosis of hydroceles.  No areas of specific tenderness noted and  patient denies pain with palpation.  Left-sided scrotal swelling considerably larger than the right. EXTREMITIES: Moderate bilateral lower extremity edema noted.  Patient is ambulatory with a cane, but uses wheelchair for mobilization in the office. NEUROLOGIC:  Alert and oriented x 3 MENTAL STATUS:  appropriate BACK:  Tender SI jt area on right. No CVAT SKIN:  Warm, dry, and intact   Results: Laboratory Data: Lab Results  Component Value Date   WBC 7.3 06/19/2021   HGB 11.5 (L) 06/19/2021   HCT 35.3 (L) 06/19/2021   MCV 106.6 (H) 06/19/2021   PLT 125 (L) 06/19/2021    Lab Results  Component Value Date   CREATININE 1.67 (H) 06/19/2021    Lab Results  Component Value Date   HGBA1C 6.3 (H) 04/29/2021    Urinalysis    Component Value Date/Time   COLORURINE YELLOW 10/14/2020 1233   APPEARANCEUR Clear 10/25/2020 1434   LABSPEC 1.014 10/14/2020 1233   PHURINE 5.0 10/14/2020 1233   GLUCOSEU Negative 10/25/2020 1434   HGBUR NEGATIVE 10/14/2020 1233   BILIRUBINUR Negative 10/25/2020 1434   [place] 10/14/2020 1233   PROTEINUR 1+ (A) 10/25/2020 1434   PROTEINUR NEGATIVE 10/14/2020 1233   NITRITE Negative 10/25/2020 1434   NITRITE NEGATIVE 10/14/2020 1233   LEUKOCYTESUR Negative 10/25/2020 1434   LEUKOCYTESUR NEGATIVE 10/14/2020 1233    Lab Results  Component Value Date   LABMICR See below: 10/25/2020   WBCUA 0-5 10/25/2020   LABEPIT 0-10 10/25/2020   MUCUS Present 10/25/2020   BACTERIA None seen 10/25/2020    Pertinent Imaging: No results found for this or any previous visit.  No results found for this or any previous visit.  No results found for this or any previous visit.  No results found for this or any previous visit.  No results found for this or any previous visit.  No results found for this or any previous visit.  No results found for this or any previous visit.  No results found for this or any previous visit.  No results found for  this or any previous visit (from the past 24 hour(s)).

## 2021-07-16 ENCOUNTER — Ambulatory Visit (INDEPENDENT_AMBULATORY_CARE_PROVIDER_SITE_OTHER): Payer: Medicare Other

## 2021-07-16 ENCOUNTER — Other Ambulatory Visit: Payer: Medicare Other

## 2021-07-16 VITALS — Ht 65.5 in | Wt 217.0 lb

## 2021-07-16 DIAGNOSIS — H539 Unspecified visual disturbance: Secondary | ICD-10-CM

## 2021-07-16 DIAGNOSIS — I10 Essential (primary) hypertension: Secondary | ICD-10-CM

## 2021-07-16 DIAGNOSIS — H9193 Unspecified hearing loss, bilateral: Secondary | ICD-10-CM | POA: Diagnosis not present

## 2021-07-16 DIAGNOSIS — Z0000 Encounter for general adult medical examination without abnormal findings: Secondary | ICD-10-CM

## 2021-07-16 LAB — URINALYSIS, ROUTINE W REFLEX MICROSCOPIC
Bilirubin, UA: NEGATIVE
Glucose, UA: NEGATIVE
Ketones, UA: NEGATIVE
Leukocytes,UA: NEGATIVE
Nitrite, UA: NEGATIVE
RBC, UA: NEGATIVE
Specific Gravity, UA: 1.02 (ref 1.005–1.030)
Urobilinogen, Ur: 0.2 mg/dL (ref 0.2–1.0)
pH, UA: 5 (ref 5.0–7.5)

## 2021-07-16 LAB — MICROSCOPIC EXAMINATION
Epithelial Cells (non renal): NONE SEEN /hpf (ref 0–10)
RBC, Urine: NONE SEEN /hpf (ref 0–2)
Renal Epithel, UA: NONE SEEN /hpf
WBC, UA: NONE SEEN /hpf (ref 0–5)

## 2021-07-16 NOTE — Patient Instructions (Signed)
Terrance Miller , Thank you for taking time to come for your Medicare Wellness Visit. I appreciate your ongoing commitment to your health goals. Please review the following plan we discussed and let me know if I can assist you in the future.   Screening recommendations/referrals: Colonoscopy: No longer required.  Recommended yearly ophthalmology/optometry visit for glaucoma screening and checkup Recommended yearly dental visit for hygiene and checkup  Vaccinations: Influenza vaccine: Due Fall 2023. Pneumococcal vaccine: Due after age 86. Tdap vaccine: Due every 10 years.  Shingles vaccine: Available at local pharmacy.    Covid-19: Available at local pharmacy.  Advanced directives: Advance directive discussed with you today. I have provided a copy for you to complete at home and have notarized. Once this is complete please bring a copy in to our office so we can scan it into your chart.   Conditions/risks identified: KEEP UP THE GOOD WORK!!  Next appointment: Follow up in one year for your annual wellness visit. 2024.  Preventive Care 86 Years and Older, Male  Preventive care refers to lifestyle choices and visits with your health care provider that can promote health and wellness. What does preventive care include? A yearly physical exam. This is also called an annual well check. Dental exams once or twice a year. Routine eye exams. Ask your health care provider how often you should have your eyes checked. Personal lifestyle choices, including: Daily care of your teeth and gums. Regular physical activity. Eating a healthy diet. Avoiding tobacco and drug use. Limiting alcohol use. Practicing safe sex. Taking low doses of aspirin every day. Taking vitamin and mineral supplements as recommended by your health care provider. What happens during an annual well check? The services and screenings done by your health care provider during your annual well check will depend on your age,  overall health, lifestyle risk factors, and family history of disease. Counseling  Your health care provider may ask you questions about your: Alcohol use. Tobacco use. Drug use. Emotional well-being. Home and relationship well-being. Sexual activity. Eating habits. History of falls. Memory and ability to understand (cognition). Work and work Statistician. Screening  You may have the following tests or measurements: Height, weight, and BMI. Blood pressure. Lipid and cholesterol levels. These may be checked every 5 years, or more frequently if you are over 50 years old. Skin check. Lung cancer screening. You may have this screening every year starting at age 60 if you have a 30-pack-year history of smoking and currently smoke or have quit within the past 15 years. Fecal occult blood test (FOBT) of the stool. You may have this test every year starting at age 71. Flexible sigmoidoscopy or colonoscopy. You may have a sigmoidoscopy every 5 years or a colonoscopy every 10 years starting at age 63. Prostate cancer screening. Recommendations will vary depending on your family history and other risks. Hepatitis C blood test. Hepatitis B blood test. Sexually transmitted disease (STD) testing. Diabetes screening. This is done by checking your blood sugar (glucose) after you have not eaten for a while (fasting). You may have this done every 1-3 years. Abdominal aortic aneurysm (AAA) screening. You may need this if you are a current or former smoker. Osteoporosis. You may be screened starting at age 86 if you are at high risk. Talk with your health care provider about your test results, treatment options, and if necessary, the need for more tests. Vaccines  Your health care provider may recommend certain vaccines, such as: Influenza vaccine. This is  recommended every year. Tetanus, diphtheria, and acellular pertussis (Tdap, Td) vaccine. You may need a Td booster every 10 years. Zoster vaccine.  You may need this after age 14. Pneumococcal 13-valent conjugate (PCV13) vaccine. One dose is recommended after age 28. Pneumococcal polysaccharide (PPSV23) vaccine. One dose is recommended after age 75. Talk to your health care provider about which screenings and vaccines you need and how often you need them. This information is not intended to replace advice given to you by your health care provider. Make sure you discuss any questions you have with your health care provider. Document Released: 01/19/2015 Document Revised: 09/12/2015 Document Reviewed: 10/24/2014 Elsevier Interactive Patient Education  2017 [place] Prevention in the Home Falls can cause injuries. They can happen to people of all ages. There are many things you can do to make your home safe and to help prevent falls. What can I do on the outside of my home? Regularly fix the edges of walkways and driveways and fix any cracks. Remove anything that might make you trip as you walk through a door, such as a raised step or threshold. Trim any bushes or trees on the path to your home. Use bright outdoor lighting. Clear any walking paths of anything that might make someone trip, such as rocks or tools. Regularly check to see if handrails are loose or broken. Make sure that both sides of any steps have handrails. Any raised decks and porches should have guardrails on the edges. Have any leaves, snow, or ice cleared regularly. Use sand or salt on walking paths during winter. Clean up any spills in your garage right away. This includes oil or grease spills. What can I do in the bathroom? Use night lights. Install grab bars by the toilet and in the tub and shower. Do not use towel bars as grab bars. Use non-skid mats or decals in the tub or shower. If you need to sit down in the shower, use a plastic, non-slip stool. Keep the floor dry. Clean up any water that spills on the floor as soon as it happens. Remove soap  buildup in the tub or shower regularly. Attach bath mats securely with double-sided non-slip rug tape. Do not have throw rugs and other things on the floor that can make you trip. What can I do in the bedroom? Use night lights. Make sure that you have a light by your bed that is easy to reach. Do not use any sheets or blankets that are too big for your bed. They should not hang down onto the floor. Have a firm chair that has side arms. You can use this for support while you get dressed. Do not have throw rugs and other things on the floor that can make you trip. What can I do in the kitchen? Clean up any spills right away. Avoid walking on wet floors. Keep items that you use a lot in easy-to-reach places. If you need to reach something above you, use a strong step stool that has a grab bar. Keep electrical cords out of the way. Do not use floor polish or wax that makes floors slippery. If you must use wax, use non-skid floor wax. Do not have throw rugs and other things on the floor that can make you trip. What can I do with my stairs? Do not leave any items on the stairs. Make sure that there are handrails on both sides of the stairs and use them. Fix handrails that are  broken or loose. Make sure that handrails are as long as the stairways. Check any carpeting to make sure that it is firmly attached to the stairs. Fix any carpet that is loose or worn. Avoid having throw rugs at the top or bottom of the stairs. If you do have throw rugs, attach them to the floor with carpet tape. Make sure that you have a light switch at the top of the stairs and the bottom of the stairs. If you do not have them, ask someone to add them for you. What else can I do to help prevent falls? Wear shoes that: Do not have high heels. Have rubber bottoms. Are comfortable and fit you well. Are closed at the toe. Do not wear sandals. If you use a stepladder: Make sure that it is fully opened. Do not climb a closed  stepladder. Make sure that both sides of the stepladder are locked into place. Ask someone to hold it for you, if possible. Clearly mark and make sure that you can see: Any grab bars or handrails. First and last steps. Where the edge of each step is. Use tools that help you move around (mobility aids) if they are needed. These include: Canes. Walkers. Scooters. Crutches. Turn on the lights when you go into a dark area. Replace any light bulbs as soon as they burn out. Set up your furniture so you have a clear path. Avoid moving your furniture around. If any of your floors are uneven, fix them. If there are any pets around you, be aware of where they are. Review your medicines with your doctor. Some medicines can make you feel dizzy. This can increase your chance of falling. Ask your doctor what other things that you can do to help prevent falls. This information is not intended to replace advice given to you by your health care provider. Make sure you discuss any questions you have with your health care provider. Document Released: 10/19/2008 Document Revised: 05/31/2015 Document Reviewed: 01/27/2014 Elsevier Interactive Patient Education  2017 [employer].
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
## 2021-07-17 ENCOUNTER — Ambulatory Visit (HOSPITAL_COMMUNITY)
Admission: RE | Admit: 2021-07-17 | Discharge: 2021-07-17 | Disposition: A | Discharge status: Home / Self Care | Payer: Medicare Other | Source: Ambulatory Visit | Attending: Physician Assistant | Admitting: Physician Assistant

## 2021-07-17 ENCOUNTER — Other Ambulatory Visit: Payer: Self-pay | Admitting: Urology

## 2021-07-17 DIAGNOSIS — N433 Hydrocele, unspecified: Secondary | ICD-10-CM | POA: Insufficient documentation

## 2021-07-17 DIAGNOSIS — N5082 Scrotal pain: Secondary | ICD-10-CM | POA: Insufficient documentation

## 2021-07-17 MED ORDER — SULFAMETHOXAZOLE-TRIMETHOPRIM 800-160 MG PO TABS: 1.0000 | ORAL_TABLET | Freq: Two times a day (BID) | ORAL | 0 refills | Status: AC

## 2021-07-18 ENCOUNTER — Telehealth: Payer: Self-pay

## 2021-07-18 NOTE — Telephone Encounter (Signed)
Patient called and made aware.

## 2021-07-18 NOTE — Telephone Encounter (Signed)
-----   Message from Primus Bravo, MD sent at 07/17/2021  8:57 AM EDT ----- Please notify patient that his urinalysis was normal. A rx for antibiotic (Bactrim DS) was sent to his pharmacy. Will contact with results of scrotal U/S when available.

## 2021-07-24 ENCOUNTER — Telehealth: Payer: Self-pay

## 2021-07-24 NOTE — Telephone Encounter (Signed)
-----   Message from Reynaldo Minium, [place] sent at 07/24/2021  9:12 AM EDT ----- Please let pt's daughter know that his hydroceles are essentially unchanged except for increase in size. [place] could not see epididymi very well, but nothing concerning was noted. He should cont. Antibx until complete and FU with Dr Felipa Eth to schedule surgery as soon as cardiology clearance complete.  ----- Message ----- From: Interface, Rad Results In Sent: 07/19/2021   3:06 PM EDT To: Reynaldo Minium, PA-C

## 2021-07-24 NOTE — Telephone Encounter (Signed)
Daughter aware of results, she states they are scheduled with cardiologist on 07/25 and will call to schedule f/u with Dr. Felipa Eth after that apt.

## 2021-07-27 ENCOUNTER — Other Ambulatory Visit: Payer: Self-pay

## 2021-07-27 ENCOUNTER — Encounter (HOSPITAL_COMMUNITY): Payer: Self-pay | Admitting: Emergency Medicine

## 2021-07-27 ENCOUNTER — Emergency Department (HOSPITAL_COMMUNITY): Payer: Medicare Other

## 2021-07-27 ENCOUNTER — Inpatient Hospital Stay (HOSPITAL_COMMUNITY)
Admission: EM | Admit: 2021-07-27 | Discharge: 2021-07-31 | DRG: 689 | Disposition: A | Discharge status: Skilled Nursing Facility | Payer: Medicare Other | Attending: Internal Medicine | Admitting: Internal Medicine

## 2021-07-27 DIAGNOSIS — R0602 Shortness of breath: Secondary | ICD-10-CM | POA: Diagnosis not present

## 2021-07-27 DIAGNOSIS — Z87891 Personal history of nicotine dependence: Secondary | ICD-10-CM | POA: Diagnosis not present

## 2021-07-27 DIAGNOSIS — Z955 Presence of coronary angioplasty implant and graft: Secondary | ICD-10-CM

## 2021-07-27 DIAGNOSIS — N390 Urinary tract infection, site not specified: Principal | ICD-10-CM

## 2021-07-27 DIAGNOSIS — Z833 Family history of diabetes mellitus: Secondary | ICD-10-CM | POA: Diagnosis not present

## 2021-07-27 DIAGNOSIS — I2510 Atherosclerotic heart disease of native coronary artery without angina pectoris: Secondary | ICD-10-CM | POA: Diagnosis present

## 2021-07-27 DIAGNOSIS — T368X5A Adverse effect of other systemic antibiotics, initial encounter: Secondary | ICD-10-CM | POA: Diagnosis present

## 2021-07-27 DIAGNOSIS — I252 Old myocardial infarction: Secondary | ICD-10-CM

## 2021-07-27 DIAGNOSIS — N433 Hydrocele, unspecified: Secondary | ICD-10-CM | POA: Diagnosis present

## 2021-07-27 DIAGNOSIS — N281 Cyst of kidney, acquired: Secondary | ICD-10-CM | POA: Diagnosis not present

## 2021-07-27 DIAGNOSIS — Z79899 Other long term (current) drug therapy: Secondary | ICD-10-CM

## 2021-07-27 DIAGNOSIS — E559 Vitamin D deficiency, unspecified: Secondary | ICD-10-CM | POA: Diagnosis not present

## 2021-07-27 DIAGNOSIS — E119 Type 2 diabetes mellitus without complications: Secondary | ICD-10-CM | POA: Diagnosis present

## 2021-07-27 DIAGNOSIS — M6281 Muscle weakness (generalized): Secondary | ICD-10-CM | POA: Diagnosis not present

## 2021-07-27 DIAGNOSIS — N1832 Chronic kidney disease, stage 3b: Secondary | ICD-10-CM

## 2021-07-27 DIAGNOSIS — E1169 Type 2 diabetes mellitus with other specified complication: Secondary | ICD-10-CM | POA: Diagnosis present

## 2021-07-27 DIAGNOSIS — R680 Hypothermia, not associated with low environmental temperature: Secondary | ICD-10-CM | POA: Diagnosis not present

## 2021-07-27 DIAGNOSIS — Z8249 Family history of ischemic heart disease and other diseases of the circulatory system: Secondary | ICD-10-CM

## 2021-07-27 DIAGNOSIS — G928 Other toxic encephalopathy: Principal | ICD-10-CM

## 2021-07-27 DIAGNOSIS — Z28310 Unvaccinated for covid-19: Secondary | ICD-10-CM | POA: Diagnosis not present

## 2021-07-27 DIAGNOSIS — I10 Essential (primary) hypertension: Secondary | ICD-10-CM | POA: Diagnosis not present

## 2021-07-27 DIAGNOSIS — T68XXXD Hypothermia, subsequent encounter: Secondary | ICD-10-CM | POA: Diagnosis not present

## 2021-07-27 DIAGNOSIS — N179 Acute kidney failure, unspecified: Secondary | ICD-10-CM

## 2021-07-27 DIAGNOSIS — N3000 Acute cystitis without hematuria: Secondary | ICD-10-CM | POA: Diagnosis not present

## 2021-07-27 DIAGNOSIS — G929 Unspecified toxic encephalopathy: Secondary | ICD-10-CM | POA: Diagnosis not present

## 2021-07-27 DIAGNOSIS — E785 Hyperlipidemia, unspecified: Secondary | ICD-10-CM | POA: Diagnosis present

## 2021-07-27 DIAGNOSIS — Z743 Need for continuous supervision: Secondary | ICD-10-CM | POA: Diagnosis not present

## 2021-07-27 DIAGNOSIS — R296 Repeated falls: Secondary | ICD-10-CM | POA: Diagnosis not present

## 2021-07-27 DIAGNOSIS — T68XXXA Hypothermia, initial encounter: Secondary | ICD-10-CM | POA: Diagnosis not present

## 2021-07-27 DIAGNOSIS — F0670 Mild neurocognitive disorder due to known physiological condition without behavioral disturbance: Secondary | ICD-10-CM | POA: Diagnosis not present

## 2021-07-27 DIAGNOSIS — G9341 Metabolic encephalopathy: Secondary | ICD-10-CM | POA: Diagnosis present

## 2021-07-27 DIAGNOSIS — Z6834 Body mass index (BMI) 34.0-34.9, adult: Secondary | ICD-10-CM

## 2021-07-27 DIAGNOSIS — R2689 Other abnormalities of gait and mobility: Secondary | ICD-10-CM | POA: Diagnosis not present

## 2021-07-27 DIAGNOSIS — E860 Dehydration: Secondary | ICD-10-CM | POA: Diagnosis not present

## 2021-07-27 DIAGNOSIS — Z87440 Personal history of urinary (tract) infections: Secondary | ICD-10-CM

## 2021-07-27 DIAGNOSIS — E669 Obesity, unspecified: Secondary | ICD-10-CM | POA: Diagnosis present

## 2021-07-27 DIAGNOSIS — R6889 Other general symptoms and signs: Secondary | ICD-10-CM | POA: Diagnosis not present

## 2021-07-27 DIAGNOSIS — F05 Delirium due to known physiological condition: Secondary | ICD-10-CM | POA: Diagnosis not present

## 2021-07-27 DIAGNOSIS — M47816 Spondylosis without myelopathy or radiculopathy, lumbar region: Secondary | ICD-10-CM | POA: Diagnosis not present

## 2021-07-27 DIAGNOSIS — R531 Weakness: Secondary | ICD-10-CM | POA: Diagnosis not present

## 2021-07-27 DIAGNOSIS — R059 Cough, unspecified: Secondary | ICD-10-CM | POA: Diagnosis not present

## 2021-07-27 DIAGNOSIS — K4020 Bilateral inguinal hernia, without obstruction or gangrene, not specified as recurrent: Secondary | ICD-10-CM | POA: Diagnosis not present

## 2021-07-27 DIAGNOSIS — F01B2 Vascular dementia, moderate, with psychotic disturbance: Secondary | ICD-10-CM | POA: Diagnosis not present

## 2021-07-27 DIAGNOSIS — G9340 Encephalopathy, unspecified: Secondary | ICD-10-CM | POA: Diagnosis not present

## 2021-07-27 LAB — COMPREHENSIVE METABOLIC PANEL
ALT: 18 U/L (ref 0–44)
AST: 21 U/L (ref 15–41)
Albumin: 3.8 g/dL (ref 3.5–5.0)
Alkaline Phosphatase: 48 U/L (ref 38–126)
Anion gap: 5 (ref 5–15)
BUN: 40 mg/dL — ABNORMAL HIGH (ref 8–23)
CO2: 19 mmol/L — ABNORMAL LOW (ref 22–32)
Calcium: 8.7 mg/dL — ABNORMAL LOW (ref 8.9–10.3)
Chloride: 112 mmol/L — ABNORMAL HIGH (ref 98–111)
Creatinine, Ser: 2.68 mg/dL — ABNORMAL HIGH (ref 0.61–1.24)
GFR, Estimated: 22 mL/min — ABNORMAL LOW (ref >60)
Glucose, Bld: 113 mg/dL — ABNORMAL HIGH (ref 70–99)
Potassium: 5 mmol/L (ref 3.5–5.1)
Sodium: 136 mmol/L (ref 135–145)
Total Bilirubin: 0.7 mg/dL (ref 0.3–1.2)
Total Protein: 7.5 g/dL (ref 6.5–8.1)

## 2021-07-27 LAB — CBC WITH DIFFERENTIAL/PLATELET
Abs Immature Granulocytes: 0.01 10*3/uL (ref 0.00–0.07)
Basophils Absolute: 0.1 10*3/uL (ref 0.0–0.1)
Basophils Relative: 1 %
Eosinophils Absolute: 0.2 10*3/uL (ref 0.0–0.5)
Eosinophils Relative: 3 %
HCT: 32.9 % — ABNORMAL LOW (ref 39.0–52.0)
Hemoglobin: 10.8 g/dL — ABNORMAL LOW (ref 13.0–17.0)
Immature Granulocytes: 0 %
Lymphocytes Relative: 23 %
Lymphs Abs: 1.3 10*3/uL (ref 0.7–4.0)
MCH: 34.4 pg — ABNORMAL HIGH (ref 26.0–34.0)
MCHC: 32.8 g/dL (ref 30.0–36.0)
MCV: 104.8 fL — ABNORMAL HIGH (ref 80.0–100.0)
Monocytes Absolute: 0.8 10*3/uL (ref 0.1–1.0)
Monocytes Relative: 14 %
Neutro Abs: 3.3 10*3/uL (ref 1.7–7.7)
Neutrophils Relative %: 59 %
Platelets: 146 10*3/uL — ABNORMAL LOW (ref 150–400)
RBC: 3.14 MIL/uL — ABNORMAL LOW (ref 4.22–5.81)
RDW: 18.5 % — ABNORMAL HIGH (ref 11.5–15.5)
WBC: 5.6 10*3/uL (ref 4.0–10.5)
nRBC: 1.1 % — ABNORMAL HIGH (ref 0.0–0.2)

## 2021-07-27 LAB — URINALYSIS, ROUTINE W REFLEX MICROSCOPIC
Bilirubin Urine: NEGATIVE
Glucose, UA: NEGATIVE mg/dL
Hgb urine dipstick: NEGATIVE
Ketones, ur: NEGATIVE mg/dL
Leukocytes,Ua: NEGATIVE
Nitrite: NEGATIVE
Protein, ur: NEGATIVE mg/dL
Specific Gravity, Urine: 1.015 (ref 1.005–1.030)
pH: 5 (ref 5.0–8.0)

## 2021-07-27 LAB — LACTIC ACID, PLASMA
Lactic Acid, Venous: 1.7 mmol/L (ref 0.5–1.9)
Lactic Acid, Venous: 1.3 mmol/L (ref 0.5–1.9)

## 2021-07-27 MED ORDER — SODIUM CHLORIDE 0.9 % IV SOLN
1.0000 g | INTRAVENOUS | Status: DC
  Administered 2021-07-28 – 2021-07-30 (×3): 1 g via INTRAVENOUS
  Filled 2021-07-27 (×3): qty 10

## 2021-07-27 MED ORDER — SODIUM CHLORIDE 0.9 % IV BOLUS
500.0000 mL | Freq: Once | INTRAVENOUS | Status: AC
  Administered 2021-07-27: 500 mL via INTRAVENOUS

## 2021-07-27 MED ORDER — SODIUM CHLORIDE 0.9 % IV SOLN
1.0000 g | Freq: Once | INTRAVENOUS | Status: AC
  Administered 2021-07-27: 1 g via INTRAVENOUS
  Filled 2021-07-27: qty 10

## 2021-07-27 MED ORDER — SODIUM CHLORIDE 0.9 % IV SOLN
1.0000 g | Freq: Once | INTRAVENOUS | Status: DC
  Administered 2021-07-27: 1 g via INTRAVENOUS
  Filled 2021-07-27: qty 10

## 2021-07-27 NOTE — ED Triage Notes (Addendum)
Patient brought in by daughter for altered mental status x3 days with generalized weakness. Patient's daughter states patient having hallucinations. Daughter states patient has recurrent UTIs in which he becomes altered. Patient seen at PCP last week due to hydrocele and diagnosed with UTI in which he was placed on sulfamethoxazole-tmp. Patient taking medication x1 week with no improvement. Family denies strong odor to urine. Per patient patient has had multiple falls (x3) in past week with last being unwitnessed. Unsure if he hit head. Denies taking any type of anticoagulant. Per daughter patient did states dizziness right before fall last night.

## 2021-07-27 NOTE — Assessment & Plan Note (Addendum)
-   Multifactorial; in the setting of sundowning, acute UTI infection and possible medications.  -Minimize sedative agents -Continue constant reorientation and supportive care -Maintain adequate hydration and continue treatment for UTI -Normal TSH and B12 appreciated. -CT head without acute intracranial abnormalities. -On today's evaluation patient was oriented x2 intermittently; but is still vividly demonstrating ongoing hallucinations and per family mentation back to baseline.. -Low-dose Seroquel nightly will be initiated.

## 2021-07-27 NOTE — Assessment & Plan Note (Addendum)
-  Continue statins. -Heart healthy diet discussed with patient. 

## 2021-07-27 NOTE — ED Notes (Signed)
Patient transported to CT 

## 2021-07-27 NOTE — Assessment & Plan Note (Signed)
-  Recently diagnosed with UTI and treated with Bactrim (at least partially) -Continue current use of Rocephin and follow culture results. -Maintain adequate hydration -Reports no dysuria currently; but complaining of frequency and is still experiencing metabolic encephalopathy.Marland Kitchen

## 2021-07-27 NOTE — Assessment & Plan Note (Signed)
-  Creatinine up to 2.68 at time of admission -per GFR stage 3b at baseline; Cr Baseline 1.67-1.8 -Secondary to Bactrim, dehydration and infection most likely -continue minimizing nephrotoxic agents, avoid hypotension and the use of contrast. -Continue adequate hydration -Follow renal function trend with repeat treatment in a.m.

## 2021-07-27 NOTE — Assessment & Plan Note (Signed)
-  Stable overall -Will continue the use of metoprolol and amlodipine. -Follow-up vital signs.

## 2021-07-27 NOTE — Assessment & Plan Note (Addendum)
-  Chronic -Urology consulted from the ED and reports that there are no acute changes and that no urology intervention required at this time. -CT renal study demonstrating no acute findings in the abdomen or pelvis. -Continue to monitor. -Outpatient follow-up with urology as needed.

## 2021-07-27 NOTE — ED Provider Notes (Signed)
  Physical Exam  BP (!) 166/81   Pulse (!) 52   Temp (!) 95.3 F (35.2 C) (Rectal)   Resp 12   Ht '5\' 6"'$  (1.676 m)   Wt 98.4 kg   SpO2 100%   BMI 35.01 kg/m   Physical Exam I evaluated the patient's groin.  He has extensive edema which is old in the testicles, no visible penis however urine does come from a dimple within the testicular region.  He does not appear to be retaining and has no tenderness in the testicles Procedures  Procedures  ED Course / MDM   Clinical Course as of 07/27/21 2142  Sat Jul 27, 2021  2142 I evaluated the patient at bedside.  He does appear to be responding to hallucinations,.  Patient has no scrotal tenderness. [AH]    Clinical Course User Index [AH] Margarita Mail, PA-C   Medical Decision Making Amount and/or Complexity of Data Reviewed Labs: ordered. Radiology: ordered.  Risk Decision regarding hospitalization.   Patient here with altered mental status, AKI.  His also hypothermic placed on Bair hugger.  Patient temperature is improving.  Patient will be admitted to the hospitalist service.  Case discussed with Dr. Clearence Ped.       Margarita Mail, PA-C 07/27/21 2331    Noemi Chapel, MD 07/28/21 [phone]

## 2021-07-27 NOTE — ED Notes (Signed)
Warming blanket applied to pt for rectal temp of 95.3

## 2021-07-27 NOTE — ED Notes (Signed)
Hospitalist at bedside 

## 2021-07-27 NOTE — Assessment & Plan Note (Addendum)
-  Secondary to infection at time of admission most likely. -Vital signs and temperature has stabilized -Patient no longer needing bear hugger

## 2021-07-27 NOTE — H&P (Signed)
History and Physical    Patient: Terrance Miller JJK:[id number] DOB: September 28, 1928 DOA: 07/27/2021 DOS: the patient was seen and examined on 07/27/2021 PCP: Coral Spikes, DO  Patient coming from: Home  Chief Complaint:  Chief Complaint  Patient presents with   Altered Mental Status   HPI: Terrance Miller is a 88 y.o. male with medical history significant of coronary artery disease, diabetes mellitus without complication-diet controlled, hyperlipidemia, hypertension presents to ED with a chief complaint of altered mental status.  Patient is unfortunately not able to answer any questions appropriately at this time.  Daughter at bedside reports that she brought him in because he was not getting any better on antibiotic prescribed outpatient.  She was on to explain that patient had a UTI several years ago and that she was confused and hallucinating.  Last week he started having hallucinations and being confused again.  He sees kids, relatives that have passed, and dogs that are not there.  He is also been fatigue, malaise, and has reversed his day/night schedule.  He has not complained of dysuria, or hematuria.  She reports his urine has not been malodorous, but it smells malodorous in the room.  Patient is almost finished with his Bactrim, and has not had any improvement in his mental status.  He has no fevers.  He has normal appetite.  She reports he has had bilateral leg weakness and 2 falls where his legs just gave out and he falls down to the ground.  He did not hit his head either time.  He did not lose consciousness.  She does not remember him complaining of chest pain palpitations before the falls.  He did 1 day stay he was dizzy before the fall, but he is not able to provide any details about that at this time.  No other complaints reported at this time.  Patient does not smoke, does not drink alcohol, does not use illicit drugs.  He is not vaccinated for COVID.  She reports that he would want to be full  code. Review of Systems: unable to review all systems due to the inability of the patient to answer questions. 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 His smoking use included cigarettes. He has never used smokeless tobacco. He reports that he does not currently use alcohol. He reports that he does not use drugs.  No Known Allergies  Family History  Problem Relation Age of Onset   Hypertension Father    Diabetes Sister     Prior to Admission medications   Medication Sig Start Date End Date Taking? Authorizing Provider  sulfamethoxazole-trimethoprim (BACTRIM DS) 800-160 MG tablet Take 1 tablet by mouth every 12 (twelve) hours for 10 days. 07/17/21 07/27/21  Stoneking, Reece Leader., MD  amLODipine (NORVASC) 5 MG tablet Take 1 tablet (5 mg total) by mouth daily. 11/22/13   Troy Sine, MD  cholecalciferol (VITAMIN D) 1000 units tablet Take 1,000 Units by mouth daily.    [provider]  meclizine (ANTIVERT) 25  MG tablet Take 25 mg by mouth daily.    [provider]  metoprolol tartrate (LOPRESSOR) 25 MG tablet Take 1 tablet (25 mg total) by mouth 2 (two) times daily. 04/29/21   Coral Spikes, DO  pravastatin (PRAVACHOL) 40 MG tablet Take 1 tablet (40 mg total) by mouth daily. 04/29/21   Coral Spikes, DO  tamsulosin (FLOMAX) 0.4 MG CAPS capsule Take 0.4 mg by mouth daily.    [provider]  TRAVATAN Z 0.004 % SOLN ophthalmic solution Place 1 drop into both eyes 2 (two) times daily. 04/13/17   [provider]    Physical  Exam: Vitals:   07/27/21 2130 07/27/21 2137 07/27/21 2230 07/27/21 2237  BP: (!) 166/81  (!) 118/58   Pulse: (!) 52  (!) 51   Resp:   16   Temp:  (!) 95.3 F (35.2 C)  (!) 97.4 F (36.3 C)  TempSrc:  Rectal  Oral  SpO2: 100%  97%   Weight:      Height:       1.  General: Patient lying supine in bed,  no acute distress   2. Psychiatric: Somnolent and oriented x self, mood and behavior normal for situation, partially cooperative with exam   3. Neurologic: Confused, not answering questions appropriately, face is symmetric, moves all 4 extremities voluntarily, at baseline without acute deficits on limited exam   4. HEENMT:  Head is atraumatic, normocephalic, pupils reactive to light, neck is supple, trachea is midline, mucous membranes are moist   5. Respiratory : Lungs are clear to auscultation bilaterally without wheezing, rhonchi, rales, no cyanosis, no increase in work of breathing or accessory muscle use   6. Cardiovascular : Heart rate normal, rhythm is regular, no murmurs, rubs or gallops, trace edema, peripheral pulses palpated   7. Gastrointestinal:  Abdomen is soft, nondistended, nontender to palpation bowel sounds active, no masses or organomegaly palpated   8. Skin:  Skin is warm, dry and intact without rashes, acute lesions, or ulcers on limited exam   9.Musculoskeletal:  No acute deformities or trauma, no asymmetry in tone, trace edema, peripheral pulses palpated, no tenderness to palpation in the extremities  Data Reviewed: In the ED Temp 95.3, heart rate 49-61, heart rate 12-16, blood pressure 108/52-166/87, satting 97-100% on room air No leukocytosis, hemoglobin 10.8, platelets 146 BUN elevated at 40, creatinine elevated 2.68 Lactic acid normal CT head shows no acute intracranial abnormality CT renal study shows no acute findings Chest x-ray shows chronic bilateral interstitial thickening Lumbar spine x-ray shows no fracture or dislocation EKG shows  sinus rhythm, heart rate 51 Urology was consulted and reports that there is no new testicular pain, or signs of cellulitis and no urology consult is required during this hospitalization Admit requested for altered mental status work-up  Assessment and Plan: AKI (acute kidney injury) ([place]) - Creatinine up to 2.68 -Baseline 1.67 -Secondary to Bactrim and infection most likely -500 mL bolus in the ED -Continue 100 mL normal saline per hour -Hold nephrotoxic agents when possible -Trend in the a.m.  UTI (urinary tract infection) - Recently diagnosed with UTI and treated with Bactrim -Symptoms have not resolved, treat with Rocephin, urine culture pending -Continue to monitor  Acute metabolic encephalopathy - Hallucinations and confusion -Also has flip-flopped day/night schedule -Secondary to UTI -Patient had 2 falls but no reported head trauma; CT head shows no acute intracranial abnormality.  Right sinus disease which could contribute to dizziness -Given that UTI is  presumptive as patient's UA looks normal today, will check TSH, B12, thiamine for completeness of work-up -Continue treatment of UTI -Continue to monitor  Hydrocele - Chronic -Urology consulted from the ED and reports that there are no acute changes then no urology intervention required at this time -No acute changes observed -CT renal study was done to confirm that there was no obstructive uropathy -Continue to monitor  Hypothermia - Secondary to infection -No other SIRS criteria -Warming blanket with frequent temperature checks -Monitor on stepdown  Hyperlipidemia LDL goal <70 - Continue pravastatin  Hypertension - Continue amlodipine and metoprolol      Advance Care Planning:   Code Status: Prior full Consults: None  Family Communication: [name], daughter at bedside  Severity of Illness: The appropriate patient status for this patient is INPATIENT. Inpatient status is judged to be reasonable and  necessary in order to provide the required intensity of service to ensure the patient's safety. The patient's presenting symptoms, physical exam findings, and initial radiographic and laboratory data in the context of their chronic comorbidities is felt to place them at high risk for further clinical deterioration. Furthermore, it is not anticipated that the patient will be medically stable for discharge from the hospital within 2 midnights of admission.   * I certify that at the point of admission it is my clinical judgment that the patient will require inpatient hospital care spanning beyond 2 midnights from the point of admission due to high intensity of service, high risk for further deterioration and high frequency of surveillance required.*  Author: Rolla Plate, DO 07/27/2021 11:54 PM  For on call review [email or web address].

## 2021-07-27 NOTE — ED Provider Notes (Signed)
[facility] EMERGENCY DEPARTMENT Provider Note   CSN: [id number] Arrival date & time: 07/27/21  1452     History  Chief Complaint  Patient presents with   Altered Mental Status    Terrance Miller is a 88 y.o. male.   Altered Mental Status Associated symptoms: no abdominal pain, no fever, no nausea, no vomiting and no weakness        Terrance Miller is a 88 y.o. male with past medical history of bilateral hydroceles, hypertension, coronary artery disease, who presents to the Emergency Department comes from home accompanied by his daughter who is requesting evaluation for generalized weakness, confusion and hallucinations.  States that he was seen last week by urology for gradually worsening hydroceles, was found to have UTI.  He was started on Bactrim.  Patient's daughter states that he is a has not improved.  She states he is having visual hallucinations of babies and dogs in the home.  She also states that he is stating that babies are talking to him.  She states he walks with a walker or rollator at baseline, but in the past week he has had several falls.  She states that "his legs are giving out" last fall within the week was unwitnessed.  She is unsure if he struck his head.  He does not currently take any blood thinners.  Patient reports having some pain of his lower back but denies any headache or dizziness.  Denies any abdominal pain, fever or chills.  No nausea or vomiting.  Daughter endorses normal appetite Daughter states that he has had large hydroceles for at least 5 or 6 years, but has been gradually increasing in size.  He was seen by urology 2 weeks ago and had scrotal ultrasound that showed bilateral hydroceles that are compressing the testes and securing the epididymi.  Daughter states that his penis is obstructed due to swelling and he typically "dribbles into a cup when he urinates."    Home Medications Prior to Admission medications   Medication Sig Start Date End Date  Taking? Authorizing Provider  sulfamethoxazole-trimethoprim (BACTRIM DS) 800-160 MG tablet Take 1 tablet by mouth every 12 (twelve) hours for 10 days. 07/17/21 07/27/21  Stoneking, Reece Leader., MD  amLODipine (NORVASC) 5 MG tablet Take 1 tablet (5 mg total) by mouth daily. 11/22/13   Troy Sine, MD  cholecalciferol (VITAMIN D) 1000 units tablet Take 1,000 Units by mouth daily.    [provider]  meclizine (ANTIVERT) 25 MG tablet Take 25 mg by mouth daily.    [provider]  metoprolol tartrate (LOPRESSOR) 25 MG tablet Take 1 tablet (25 mg total) by mouth 2 (two) times daily. 04/29/21   Coral Spikes, DO  pravastatin (PRAVACHOL) 40 MG tablet Take 1 tablet (40 mg total) by mouth daily. 04/29/21   Coral Spikes, DO  tamsulosin (FLOMAX) 0.4 MG CAPS capsule Take 0.4 mg by mouth daily.    [provider]  TRAVATAN Z 0.004 % SOLN ophthalmic solution Place 1 drop into both eyes 2 (two) times daily. 04/13/17   [provider]      Allergies    Patient has no known allergies.    Review of Systems   Review of Systems  Constitutional:  Negative for appetite change, chills and fever.  Respiratory:  Negative for shortness of breath.   Cardiovascular:  Negative for chest pain.  Gastrointestinal:  Negative for abdominal pain, nausea and vomiting.  Genitourinary:  Positive for difficulty urinating  and scrotal swelling.  Musculoskeletal:  Positive for back pain.  Neurological:  Negative for weakness and numbness.    Physical Exam Updated Vital Signs BP (!) 152/79   Pulse (!) 56   Temp 97.6 F (36.4 C) (Oral)   Resp 12   Ht '5\' 6"'$  (1.676 m)   Wt 98.4 kg   SpO2 100%   BMI 35.01 kg/m  Physical Exam Vitals and nursing note reviewed.  Constitutional:      General: He is not in acute distress.    Appearance: Normal appearance.  Cardiovascular:     Rate and Rhythm: Normal rate and regular rhythm.     Pulses: Normal pulses.  Pulmonary:     Effort: Pulmonary  effort is normal.     Breath sounds: Normal breath sounds.  Chest:     Chest wall: No tenderness.  Skin:    General: Skin is warm.     Capillary Refill: Capillary refill takes less than 2 seconds.     Findings: No rash.  Neurological:     General: No focal deficit present.     Mental Status: He is alert.     Sensory: Sensation is intact.     Motor: Motor function is intact. No weakness, abnormal muscle tone or pronator drift.     Comments: Patient oriented to person and place only.  Patient answers questions appropriately.     ED Results / Procedures / Treatments   Labs (all labs ordered are listed, but only abnormal results are displayed) Labs Reviewed  CBC WITH DIFFERENTIAL/PLATELET - Abnormal; Notable for the following components:      Result Value   RBC 3.14 (*)    Hemoglobin 10.8 (*)    HCT 32.9 (*)    MCV 104.8 (*)    MCH 34.4 (*)    RDW 18.5 (*)    Platelets 146 (*)    nRBC 1.1 (*)    All other components within normal limits  COMPREHENSIVE METABOLIC PANEL - Abnormal; Notable for the following components:   Chloride 112 (*)    CO2 19 (*)    Glucose, Bld 113 (*)    BUN 40 (*)    Creatinine, Ser 2.68 (*)    Calcium 8.7 (*)    GFR, Estimated 22 (*)    All other components within normal limits  URINE CULTURE  LACTIC ACID, PLASMA  LACTIC ACID, PLASMA  URINALYSIS, ROUTINE W REFLEX MICROSCOPIC    EKG None  Radiology CT Renal Stone Study  Result Date: 07/27/2021 CLINICAL DATA:  Flank pain, UTI, multiple falls, weakness EXAM: CT ABDOMEN AND PELVIS WITHOUT CONTRAST TECHNIQUE: Multidetector CT imaging of the abdomen and pelvis was performed following the standard protocol without IV contrast. RADIATION DOSE REDUCTION: This exam was performed according to the departmental dose-optimization program which includes automated exposure control, adjustment of the mA and/or kV according to patient size and/or use of iterative reconstruction technique. COMPARISON:  None  Available. FINDINGS: Motion degraded images. Lower chest: Lung bases are essentially clear. Hepatobiliary: Unenhanced liver is grossly unremarkable. Gallbladder is unremarkable. No intrahepatic or extrahepatic ductal dilatation. Pancreas: Within normal limits. Spleen: Within normal limits. Adrenals/Urinary Tract: Adrenal glands are within normal limits. Bilateral renal cysts, including a dominant 3.4 cm right lower pole renal cyst. Additional subcentimeter hyperdense cyst in the right upper kidney (series 2/image 28). No renal calculi or hydronephrosis. Bladder is underdistended but unremarkable. Stomach/Bowel: Stomach is within normal limits. No evidence of bowel obstruction. Normal appendix (series 2/image 85).  No colonic wall thickening or inflammatory changes. Vascular/Lymphatic: No evidence of abdominal aortic aneurysm. Atherosclerotic calcifications of the abdominal aorta and branch vessels. Mild nonspecific lymphatic stranding along the jejunal mesentery (series 2/image 43), without discrete lymphadenopathy. No suspicious abdominopelvic lymphadenopathy. Reproductive: Prostate is unremarkable. Other: No abdominopelvic ascites. Moderate left and small right fat containing inguinal hernias (series 2/image 84). Musculoskeletal: Degenerative changes of the visualized thoracolumbar spine. IMPRESSION: Motion degraded images. No acute findings in the abdomen/pelvis. Additional ancillary findings as above. Electronically Signed   By: Julian Hy M.D.   On: 07/27/2021 19:28   CT Head Wo Contrast  Result Date: 07/27/2021 CLINICAL DATA:  Mental status change over the last 3 days with generalized weakness. EXAM: CT HEAD WITHOUT CONTRAST TECHNIQUE: Contiguous axial images were obtained from the base of the skull through the vertex without intravenous contrast. RADIATION DOSE REDUCTION: This exam was performed according to the departmental dose-optimization program which includes automated exposure control,  adjustment of the mA and/or kV according to patient size and/or use of iterative reconstruction technique. COMPARISON:  05/20/2017. FINDINGS: Brain: No evidence of acute infarction, hemorrhage, hydrocephalus, extra-axial collection or mass lesion/mass effect. Age-appropriate volume loss. Mild periventricular white matter hypoattenuation consistent with chronic microvascular ischemic change. Vascular: No hyperdense vessel or unexpected calcification. Skull: Normal. Negative for fracture or focal lesion. Sinuses/Orbits: Globes and orbits are unremarkable. Near complete opacification of the right maxillary sinus from mucosal thickening and some fluid. Right sphenoid sinus is opacified. Mild right-sided ethmoid sinus mucosal thickening. Other: None. IMPRESSION: 1. No acute intracranial abnormalities. 2. Right-sided sinus disease which may contribute to dizziness/falling. Electronically Signed   By: Lajean Manes M.D.   On: 07/27/2021 18:46   DG Lumbar Spine Complete  Result Date: 07/27/2021 CLINICAL DATA:  Falls EXAM: LUMBAR SPINE - COMPLETE 4+ VIEW COMPARISON:  None Available. FINDINGS: There is no evidence of lumbar spine fracture. Alignment is normal. Moderate multilevel disc space height loss and osteophytosis. Mild multilevel facet degenerative change. Nonobstructive pattern of overlying bowel gas. IMPRESSION: No fracture or dislocation of the lumbar spine. Moderate multilevel disc space height loss and osteophytosis. Electronically Signed   By: Delanna Ahmadi M.D.   On: 07/27/2021 17:36   DG Chest 2 View  Result Date: 07/27/2021 CLINICAL DATA:  Falls.  Altered mental status. EXAM: CHEST - 2 VIEW COMPARISON:  AP chest 05/21/2017 FINDINGS: Cardiac silhouette is again moderately enlarged. Mediastinal contours are grossly within normal limits. Mild calcification within aortic arch. Mildly decreased lung volumes. Bibasilar bronchovascular crowding. Mild chronic bilateral interstitial thickening. Cephalization of  the pulmonary vasculature. No pleural effusion or pneumothorax. Numerous round small metallic densities overlie the left chest wall and left upper arm, presumably from metallic BBs. Moderate multilevel degenerative disc changes of the thoracic spine. IMPRESSION: 1. Stable moderately enlarged cardiac silhouette. 2. Chronic bilateral interstitial thickening may represent interstitial scarring versus less likely chronic mild interstitial pulmonary edema. 3. No focal airspace opacity to indicate pneumonia. Electronically Signed   By: Yvonne Kendall M.D.   On: 07/27/2021 17:36    Procedures Procedures    Medications Ordered in ED Medications - No data to display  ED Course/ Medical Decision Making/ A&P                           Medical Decision Making Patient here from home accompanied by his daughter whom he lives with.  Brought in for increased confusion, hallucinations and generalized weakness with frequent falls recently.  Patient is known to have large bilateral hydroceles.  Penis has been obscured for some time according to his daughter he "dribbles into a cup."  Was seen by urology week and a half ago, had scrotal ultrasound and was found to have UTI.  He was started on Bactrim.  Daughter brings him to the ED are today due to increased confusion and concerned that his UTI is not improving.  On exam, patient is alert, he is nontoxic-appearing.  Answers questions appropriately and follows commands well.  He is oriented to person and place only.  We will try to attempt to to obtain urinalysis, his abdomen is soft and nontender.  No palpable fullness over the bladder.  Obtain labs imaging.  He will likely need hospital admission.  Amount and/or Complexity of Data Reviewed Labs: ordered.    Details: Labs interpreted by me, have no evidence of leukocytosis.  Hemoglobin 10.8, lactic acid unremarkable chemistries show new AKI.  Urinalysis and urine culture ordered, but patient unable to provide urine  sample at this time.  Unable to obtain in and out Foley catheter as meatus obscured due to his large hydroceles. Radiology: ordered.    Details: Chest x-ray shows stable moderately enlarged cardiac silhouette.  L-spine ordered due to patient reported back pain and noted to have falls.  No acute fracture or dislocation of the L-spine.  CT head without acute intracranial abnormality.  CT renal stone study shows no acute findings of the abdomen or pelvis. Discussion of management or test interpretation with external provider(s): IV Rocephin ordered and IV fluids for his AKI.  Pending completion of work-up, patient will likely need hospital admission.  Discussed with Margarita Mail, PA-C who agrees to review results and arrange hospital admission.           Final Clinical Impression(s) / ED Diagnoses Final diagnoses:  None    Rx / DC Orders ED Discharge Orders     None         Bufford Lope 07/27/21 1947    Noemi Chapel, MD 07/28/21 [phone]

## 2021-07-28 ENCOUNTER — Inpatient Hospital Stay (HOSPITAL_COMMUNITY): Payer: Medicare Other

## 2021-07-28 ENCOUNTER — Encounter (HOSPITAL_COMMUNITY): Payer: Self-pay | Admitting: Family Medicine

## 2021-07-28 DIAGNOSIS — G928 Other toxic encephalopathy: Secondary | ICD-10-CM

## 2021-07-28 DIAGNOSIS — G9341 Metabolic encephalopathy: Secondary | ICD-10-CM | POA: Diagnosis not present

## 2021-07-28 DIAGNOSIS — N433 Hydrocele, unspecified: Secondary | ICD-10-CM | POA: Diagnosis not present

## 2021-07-28 DIAGNOSIS — T68XXXD Hypothermia, subsequent encounter: Secondary | ICD-10-CM

## 2021-07-28 DIAGNOSIS — N1832 Chronic kidney disease, stage 3b: Secondary | ICD-10-CM

## 2021-07-28 DIAGNOSIS — E785 Hyperlipidemia, unspecified: Secondary | ICD-10-CM | POA: Diagnosis not present

## 2021-07-28 LAB — COMPREHENSIVE METABOLIC PANEL
ALT: 18 U/L (ref 0–44)
AST: 24 U/L (ref 15–41)
Albumin: 3.5 g/dL (ref 3.5–5.0)
Alkaline Phosphatase: 44 U/L (ref 38–126)
Anion gap: 5 (ref 5–15)
BUN: 37 mg/dL — ABNORMAL HIGH (ref 8–23)
CO2: 20 mmol/L — ABNORMAL LOW (ref 22–32)
Calcium: 8.5 mg/dL — ABNORMAL LOW (ref 8.9–10.3)
Chloride: 113 mmol/L — ABNORMAL HIGH (ref 98–111)
Creatinine, Ser: 2.42 mg/dL — ABNORMAL HIGH (ref 0.61–1.24)
GFR, Estimated: 25 mL/min — ABNORMAL LOW (ref >60)
Glucose, Bld: 99 mg/dL (ref 70–99)
Potassium: 4.8 mmol/L (ref 3.5–5.1)
Sodium: 138 mmol/L (ref 135–145)
Total Bilirubin: 0.6 mg/dL (ref 0.3–1.2)
Total Protein: 7 g/dL (ref 6.5–8.1)

## 2021-07-28 LAB — CBC WITH DIFFERENTIAL/PLATELET
Abs Immature Granulocytes: 0.01 10*3/uL (ref 0.00–0.07)
Basophils Absolute: 0.1 10*3/uL (ref 0.0–0.1)
Basophils Relative: 2 %
Eosinophils Absolute: 0.2 10*3/uL (ref 0.0–0.5)
Eosinophils Relative: 3 %
HCT: 29.2 % — ABNORMAL LOW (ref 39.0–52.0)
Hemoglobin: 9.8 g/dL — ABNORMAL LOW (ref 13.0–17.0)
Immature Granulocytes: 0 %
Lymphocytes Relative: 28 %
Lymphs Abs: 1.3 10*3/uL (ref 0.7–4.0)
MCH: 34.9 pg — ABNORMAL HIGH (ref 26.0–34.0)
MCHC: 33.6 g/dL (ref 30.0–36.0)
MCV: 103.9 fL — ABNORMAL HIGH (ref 80.0–100.0)
Monocytes Absolute: 0.7 10*3/uL (ref 0.1–1.0)
Monocytes Relative: 15 %
Neutro Abs: 2.3 10*3/uL (ref 1.7–7.7)
Neutrophils Relative %: 52 %
Platelets: 134 10*3/uL — ABNORMAL LOW (ref 150–400)
RBC: 2.81 MIL/uL — ABNORMAL LOW (ref 4.22–5.81)
RDW: 18.5 % — ABNORMAL HIGH (ref 11.5–15.5)
WBC: 4.5 10*3/uL (ref 4.0–10.5)
nRBC: 0.7 % — ABNORMAL HIGH (ref 0.0–0.2)

## 2021-07-28 LAB — VITAMIN B12: Vitamin B-12: 703 pg/mL (ref 180–914)

## 2021-07-28 LAB — TSH: TSH: 1.778 u[IU]/mL (ref 0.350–4.500)

## 2021-07-28 LAB — MAGNESIUM: Magnesium: 2.3 mg/dL (ref 1.7–2.4)

## 2021-07-28 MED ORDER — HEPARIN SODIUM (PORCINE) 5000 UNIT/ML IJ SOLN
5000.0000 [IU] | Freq: Three times a day (TID) | INTRAMUSCULAR | Status: DC
Start: 2021-07-28 — End: 2021-07-31
  Administered 2021-07-28 – 2021-07-31 (×11): 5000 [IU] via SUBCUTANEOUS
  Filled 2021-07-28 (×11): qty 1

## 2021-07-28 MED ORDER — ACETAMINOPHEN 325 MG PO TABS: 650.0000 mg | ORAL_TABLET | Freq: Four times a day (QID) | ORAL | Status: DC | PRN

## 2021-07-28 MED ORDER — ACETAMINOPHEN 650 MG RE SUPP: 650.0000 mg | Freq: Four times a day (QID) | RECTAL | Status: DC | PRN

## 2021-07-28 MED ORDER — ONDANSETRON HCL 4 MG PO TABS
4.0000 mg | ORAL_TABLET | Freq: Four times a day (QID) | ORAL | Status: DC | PRN
  Administered 2021-07-30: 4 mg via ORAL
  Filled 2021-07-28: qty 1

## 2021-07-28 MED ORDER — MELATONIN 3 MG PO TABS
6.0000 mg | ORAL_TABLET | Freq: Every day | ORAL | Status: DC
  Administered 2021-07-28 – 2021-07-30 (×3): 6 mg via ORAL
  Filled 2021-07-28 (×3): qty 2

## 2021-07-28 MED ORDER — SODIUM CHLORIDE 0.9 % IV SOLN: INTRAVENOUS | Status: DC

## 2021-07-28 MED ORDER — TAMSULOSIN HCL 0.4 MG PO CAPS
0.4000 mg | ORAL_CAPSULE | Freq: Every day | ORAL | Status: DC
Start: 2021-07-28 — End: 2021-07-31
  Administered 2021-07-28 – 2021-07-31 (×4): 0.4 mg via ORAL
  Filled 2021-07-28 (×4): qty 1

## 2021-07-28 MED ORDER — LATANOPROST 0.005 % OP SOLN
1.0000 [drp] | Freq: Every day | OPHTHALMIC | Status: DC
  Administered 2021-07-28 – 2021-07-30 (×3): 1 [drp] via OPHTHALMIC
  Filled 2021-07-28 (×3): qty 2.5

## 2021-07-28 MED ORDER — MORPHINE SULFATE (PF) 2 MG/ML IV SOLN: 2.0000 mg | INTRAVENOUS | Status: DC | PRN

## 2021-07-28 MED ORDER — ONDANSETRON HCL 4 MG/2ML IJ SOLN: 4.0000 mg | Freq: Four times a day (QID) | INTRAMUSCULAR | Status: DC | PRN

## 2021-07-28 MED ORDER — POLYETHYLENE GLYCOL 3350 17 G PO PACK
17.0000 g | PACK | Freq: Every day | ORAL | Status: DC | PRN
Start: 2021-07-28 — End: 2021-07-31

## 2021-07-28 MED ORDER — OXYCODONE HCL 5 MG PO TABS: 5.0000 mg | ORAL_TABLET | ORAL | Status: DC | PRN

## 2021-07-28 MED ORDER — CHLORHEXIDINE GLUCONATE CLOTH 2 % EX PADS
6.0000 | MEDICATED_PAD | Freq: Every day | CUTANEOUS | Status: DC
  Administered 2021-07-28 – 2021-07-30 (×3): 6 via TOPICAL

## 2021-07-28 MED ORDER — METOPROLOL TARTRATE 25 MG PO TABS
25.0000 mg | ORAL_TABLET | Freq: Two times a day (BID) | ORAL | Status: DC
  Administered 2021-07-28 – 2021-07-30 (×5): 25 mg via ORAL
  Filled 2021-07-28 (×7): qty 1

## 2021-07-28 MED ORDER — AMLODIPINE BESYLATE 5 MG PO TABS
5.0000 mg | ORAL_TABLET | Freq: Every day | ORAL | Status: DC
  Administered 2021-07-28 – 2021-07-30 (×3): 5 mg via ORAL
  Filled 2021-07-28 (×4): qty 1

## 2021-07-28 MED ORDER — PRAVASTATIN SODIUM 40 MG PO TABS
40.0000 mg | ORAL_TABLET | Freq: Every day | ORAL | Status: DC
  Administered 2021-07-28 – 2021-07-30 (×3): 40 mg via ORAL
  Filled 2021-07-28 (×3): qty 1

## 2021-07-28 NOTE — Progress Notes (Addendum)
Progress Note   Patient: Terrance Miller FKC:[id number] DOB: 05-07-1928 DOA: 07/27/2021     1 DOS: the patient was seen and examined on 07/28/2021   Brief hospital course: As per H&P written by Dr. Clearence Ped on 07/27/21 Terrance Miller is a 88 y.o. male with medical history significant of coronary artery disease, diabetes mellitus without complication-diet controlled, hyperlipidemia, hypertension presents to ED with a chief complaint of altered mental status.  Patient is unfortunately not able to answer any questions appropriately at this time.  Daughter at bedside reports that she brought him in because he was not getting any better on antibiotic prescribed outpatient.  She was on to explain that patient had a UTI several years ago and that she was confused and hallucinating.  Last week he started having hallucinations and being confused again.  He sees kids, relatives that have passed, and dogs that are not there.  He is also been fatigue, malaise, and has reversed his day/night schedule.  He has not complained of dysuria, or hematuria.  She reports his urine has not been malodorous, but it smells malodorous in the room.  Patient is almost finished with his Bactrim, and has not had any improvement in his mental status.  He has no fevers.  He has normal appetite.  She reports he has had bilateral leg weakness and 2 falls where his legs just gave out and he falls down to the ground.  He did not hit his head either time.  He did not lose consciousness.  She does not remember him complaining of chest pain palpitations before the falls.  He did 1 day stay he was dizzy before the fall, but he is not able to provide any details about that at this time.  No other complaints reported at this time.   Patient does not smoke, does not drink alcohol, does not use illicit drugs.  He is not vaccinated for COVID.  She reports that he would want to be full code.  Assessment and Plan: AKI (acute kidney injury)  ([place]) -Creatinine up to 2.68 at time of admission -per GFR stage 3b at baseline; Cr Baseline 1.67-1.8 -Secondary to Bactrim, dehydration and infection most likely - continue minimizing nephrotoxic agents, avoid hypotension and the use of contrast. -Continue adequate hydration -Follow renal function trend.   UTI (urinary tract infection) -Recently diagnosed with UTI and treated with Bactrim -Continue current use of Rocephin and follow culture results. -Maintain adequate hydration -Reports no dysuria currently.   Acute metabolic encephalopathy - Multifactorial; in the setting of sundowning, acute UTI infection and possible medications.  -Minimize sedative agents -Continue constant reorientation -Maintain adequate hydration and continue treatment for UTI -Normal TSH and B12 appreciated. -CT head without acute intracranial abnormalities. -On today's evaluation patient was oriented x2.   Hydrocele -Chronic -Urology consulted from the ED and reports that there are no acute changes and that no urology intervention required at this time -CT renal study demonstrating no acute findings in the abdomen or pelvis. -Continue to monitor  Hypothermia -Secondary to infection -Continue as needed bear hugger for now. -Vital signs/temperature currently stable. -Planning to move patient out of stepdown unit if (no longer needed.  Hyperlipidemia LDL goal <70 -Continue statins. -Heart healthy diet discussed with patient.  Hypertension -Stable overall -Will continue the use of metoprolol and amlodipine. -Follow-up vital signs.   Class I obesity -Low calorie diet and portion control discussed with patient -Body mass index is 34.98 kg/m.   Subjective:  Oriented x2 today's  examination; stable vital signs and improved temperature after using warming blankets.  No reported dysuria currently.  Appears chronically ill, weak and deconditioned.  Physical Exam: Vitals:   07/28/21 0425  07/28/21 0500 07/28/21 0600 07/28/21 0800  BP:  (!) 174/69 (!) 168/98   Pulse:  (!) 59 63   Resp:  16 17   Temp: 98.3 F (36.8 C)   98.7 F (37.1 C)  TempSrc: Oral   Axillary  SpO2:  99% 99%   Weight:      Height:       General exam: Alert, awake, oriented x 2; afebrile and with stable vital signs.  No overnight events.  Reports no chest pain, nausea, vomiting or dysuria. Respiratory system: Good air movement bilaterally; no using accessory muscle.  Positive scattered rhonchi. Cardiovascular system: Rate controlled, no rubs, no gallops, unable to properly assess JVD with body habitus. Gastrointestinal system: Abdomen is obese, nondistended, soft and nontender. No organomegaly or masses felt. Normal bowel sounds heard. Central nervous system: Alert and oriented. No focal neurological deficits. Extremities: No cyanosis or clubbing. Skin: No petechiae. Psychiatry: Mood & affect appropriate.   Data Reviewed: Urine culture pending Blood culture without growth B12 level 703 Comprehensive metabolic panel: Sodium 456, potassium 4.8, chloride 113, bicarb 20, BUN 37, creatinine 2.42 and normal LFTs appreciated. Magnesium 2.3 CBC demonstrating white blood cells 4.5, hemoglobin 9.8, MCV one 3.9; platelet count 134 K   Family Communication: no family at bedside.  Disposition: Status is: Inpatient Remains inpatient appropriate because: receiving IV antibiotics, mentation still not back to baseline and having hypothermia.   Planned Discharge Destination: Home   Author: Barton Dubois, MD 07/28/2021 9:22 AM  For on call review [email or web address].

## 2021-07-29 ENCOUNTER — Encounter: Payer: Self-pay | Admitting: Cardiology

## 2021-07-29 DIAGNOSIS — E785 Hyperlipidemia, unspecified: Secondary | ICD-10-CM | POA: Diagnosis not present

## 2021-07-29 DIAGNOSIS — G9341 Metabolic encephalopathy: Secondary | ICD-10-CM | POA: Diagnosis not present

## 2021-07-29 DIAGNOSIS — N433 Hydrocele, unspecified: Secondary | ICD-10-CM | POA: Diagnosis not present

## 2021-07-29 DIAGNOSIS — G928 Other toxic encephalopathy: Secondary | ICD-10-CM | POA: Diagnosis not present

## 2021-07-29 LAB — URINE CULTURE: Culture: NO GROWTH

## 2021-07-29 LAB — MRSA NEXT GEN BY PCR, NASAL: MRSA by PCR Next Gen: NOT DETECTED

## 2021-07-29 MED ORDER — LORAZEPAM 2 MG/ML IJ SOLN
1.0000 mg | Freq: Once | INTRAMUSCULAR | Status: AC
  Administered 2021-07-29: 1 mg via INTRAVENOUS
  Filled 2021-07-29: qty 1

## 2021-07-29 MED ORDER — QUETIAPINE FUMARATE 25 MG PO TABS
12.5000 mg | ORAL_TABLET | Freq: Every day | ORAL | Status: DC
  Administered 2021-07-29 – 2021-07-30 (×2): 12.5 mg via ORAL
  Filled 2021-07-29 (×2): qty 1

## 2021-07-29 NOTE — Evaluation (Signed)
Physical Therapy Evaluation Patient Details Name: Terrance Miller MRN: [id number] DOB: [birth year] Today's Date: 07/29/2021  History of Present Illness  Terrance Miller is a 88 y.o. male with medical history significant of coronary artery disease, diabetes mellitus without complication-diet controlled, hyperlipidemia, hypertension presents to ED with a chief complaint of altered mental status.  Patient is unfortunately not able to answer any questions appropriately at this time.  Daughter at bedside reports that she brought him in because he was not getting any better on antibiotic prescribed outpatient.  She was on to explain that patient had a UTI several years ago and that she was confused and hallucinating.  Last week he started having hallucinations and being confused again.  He sees kids, relatives that have passed, and dogs that are not there.  He is also been fatigue, malaise, and has reversed his day/night schedule.  He has not complained of dysuria, or hematuria.  She reports his urine has not been malodorous, but it smells malodorous in the room.  Patient is almost finished with his Bactrim, and has not had any improvement in his mental status.  He has no fevers.  He has normal appetite.  She reports he has had bilateral leg weakness and 2 falls where his legs just gave out and he falls down to the ground.  He did not hit his head either time.  He did not lose consciousness.  She does not remember him complaining of chest pain palpitations before the falls.  He did 1 day stay he was dizzy before the fall, but he is not able to provide any details about that at this time.  No other complaints reported at this time.   Clinical Impression  Patient supine in bed staring at ceiling and daughter present. Patient agreeable to participating in PT evaluation today. Daughter reports her sister, Jackelyn Hoehn, would be who could provide history and living conditions information. [name] contact via telephone.  Patient requires mod to max assist for all mobility, has a difficult time following directions using multimodal cuing, demonstrates decreased mentation from baseline, is at a high risk for falls and demonstrates a hard posterior lean in seated and standing positions requiring assistance to remain upright and deter a fall.  Patient would continue to benefit from skilled physical therapy in current environment and next venue to continue return to prior function and increase strength, endurance, balance, coordination, and functional mobility and gait skills.         Recommendations for follow up therapy are one component of a multi-disciplinary discharge planning process, led by the attending physician.  Recommendations may be updated based on patient status, additional functional criteria and insurance authorization.  Follow Up Recommendations Skilled nursing-short term rehab (<3 hours/day) Can patient physically be transported by private vehicle: Yes    Assistance Recommended at Discharge Frequent or constant Supervision/Assistance  Patient can return home with the following  A lot of help with walking and/or transfers;A lot of help with bathing/dressing/bathroom;Assist for transportation;Assistance with cooking/housework;Help with stairs or ramp for entrance;Direct supervision/assist for medications management    Equipment Recommendations None recommended by PT  Recommendations for Other Services       Functional Status Assessment Patient has had a recent decline in their functional status and demonstrates the ability to make significant improvements in function in a reasonable and predictable amount of time.     Precautions / Restrictions Precautions Precautions: Fall Precaution Comments: daughter reports 2 falls prior to admission Restrictions Weight Bearing Restrictions: No  Mobility  Bed Mobility Overal bed mobility: Needs Assistance Bed Mobility: Supine to Sit, Sit to  Supine     Supine to sit: Mod assist Sit to supine: Mod assist   General bed mobility comments: slow, labored movement with involuntary movements of body; assistance from trunk and lower extremities    Transfers Overall transfer level: Needs assistance Equipment used: Rolling walker (2 wheels) Transfers: Sit to/from Stand Sit to Stand: Mod assist           General transfer comment: slow, labored movement with cues for sequencing of steps and placement of hands with use of RW; hard posterior lean    Ambulation/Gait Ambulation/Gait assistance: Max assist, Mod assist Gait Distance (Feet): 2 Feet Assistive device: Rolling walker (2 wheels) Gait Pattern/deviations: Step-to pattern, Decreased step length - right, Decreased step length - left, Decreased stride length, Decreased dorsiflexion - right, Decreased dorsiflexion - left Gait velocity: decreased     General Gait Details: very unsteady with hard posterior lean limited to 4-5 short side steps with RW along bedisde for safety; limited by fatigue; staring off toward ceiling throughout  Stairs            Wheelchair Mobility    Modified Rankin (Stroke Patients Only)       Balance Overall balance assessment: Needs assistance Sitting-balance support: Bilateral upper extremity supported, Feet supported Sitting balance-Leahy Scale: Poor   Postural control: Posterior lean Standing balance support: Bilateral upper extremity supported, During functional activity, Reliant on assistive device for balance Standing balance-Leahy Scale: Zero Standing balance comment: w/ RW and hard posterior lean         Pertinent Vitals/Pain Pain Assessment Breathing: normal Negative Vocalization: none Facial Expression: smiling or inexpressive Body Language: relaxed Consolability: no need to console PAINAD Score: 0    Home Living Family/patient expects to be discharged to:: Private residence Living Arrangements: Children;Other  relatives Available Help at Discharge: Family Type of Home: House Home Access: Stairs to enter Entrance Stairs-Rails: None Entrance Stairs-Number of Steps: 3 Alternate Level Stairs-Number of Steps: 4 Home Layout: Able to live on main level with bedroom/bathroom;Two level Home Equipment: Toilet riser;Cane - single point;Rolling Walker (2 wheels);Rollator (4 wheels)      Prior Function     Mobility Comments: walked households distances with a cane and use a walker to walk outside ADLs Comments: assistance for IADLs     Hand Dominance        Extremity/Trunk Assessment   Upper Extremity Assessment Upper Extremity Assessment: Generalized weakness    Lower Extremity Assessment Lower Extremity Assessment: Generalized weakness    Cervical / Trunk Assessment Cervical / Trunk Assessment: Normal  Communication   Communication: HOH  Cognition Arousal/Alertness: Awake/alert Behavior During Therapy: WFL for tasks assessed/performed Overall Cognitive Status: Impaired/Different from baseline        General Comments      Exercises     Assessment/Plan    PT Assessment Patient needs continued PT services  PT Problem List Decreased strength;Decreased mobility;Decreased safety awareness;Decreased knowledge of precautions;Decreased activity tolerance;Decreased cognition;Decreased balance;Decreased knowledge of use of DME       PT Treatment Interventions DME instruction;Therapeutic exercise;Gait training;Balance training;Neuromuscular re-education;Cognitive remediation;Functional mobility training;Therapeutic activities;Patient/family education    PT Goals (Current goals can be found in the Care Plan section)  Acute Rehab PT Goals Patient Stated Goal: Get better and go home PT Goal Formulation: With patient/family Time For Goal Achievement: 08/12/21 Potential to Achieve Goals: Fair    Frequency Min 3X/week  AM-PAC PT "6 Clicks" Mobility  Outcome Measure Help  needed turning from your back to your side while in a flat bed without using bedrails?: A Little Help needed moving from lying on your back to sitting on the side of a flat bed without using bedrails?: A Lot Help needed moving to and from a bed to a chair (including a wheelchair)?: A Lot Help needed standing up from a chair using your arms (e.g., wheelchair or bedside chair)?: A Lot Help needed to walk in hospital room?: A Lot Help needed climbing 3-5 steps with a railing? : Total 6 Click Score: 12    End of Session Equipment Utilized During Treatment: Gait belt Activity Tolerance: Patient limited by fatigue Patient left: in bed;with call bell/phone within reach;with bed alarm set;with family/visitor present Nurse Communication: Mobility status PT Visit Diagnosis: Unsteadiness on feet (R26.81);Repeated falls (R29.6);Muscle weakness (generalized) (M62.81);Other abnormalities of gait and mobility (R26.89)    Time: 1281-1886 PT Time Calculation (min) (ACUTE ONLY): 29 min   Charges:   PT Evaluation $PT Eval Low Complexity: 1 Low PT Treatments $Therapeutic Activity: 8-22 mins        Floria Raveling. Hartnett-Rands, MS, PT Per [place] [phone]  Pamala Hurry  Hartnett-Rands 07/29/2021, 12:39 PM

## 2021-07-29 NOTE — NC FL2 (Signed)
[place] LEVEL OF CARE SCREENING TOOL     IDENTIFICATION  Patient Name: Terrance Miller Birthdate: 1928-07-06 Sex: male Admission Date (Current Location): 07/27/2021  [facility] and [place] Number:  [employer] and Address:  [place] [address], [place]      Provider Number: [id number]  Attending Physician Name and Address:  Barton Dubois, MD  Relative Name and Phone Number:       Current Level of Care: Hospital Recommended Level of Care: [place] Prior Approval Number:    Date Approved/Denied:   PASRR Number: [id number]  Discharge Plan: SNF    Current Diagnoses: Patient Active Problem List   Diagnosis Date Noted   Acute metabolic encephalopathy 48/54/6270   UTI (urinary tract infection) 07/27/2021   AKI (acute kidney injury) ([place]) 07/27/2021   Macrocytic anemia 05/31/2021   History of MI (myocardial infarction) 04/29/2021   Hydrocele 10/26/2020   Hypothermia 05/20/2017   Hypertension 09/28/2013   Hyperlipidemia LDL goal <70 09/28/2013   CAD (coronary artery disease) 09/28/2013    Orientation RESPIRATION BLADDER Height & Weight     Self, Place  Normal Continent, External catheter Weight: 216 lb 11.4 oz (98.3 kg) Height:  '5\' 6"'$  (167.6 cm)  BEHAVIORAL SYMPTOMS/MOOD NEUROLOGICAL BOWEL NUTRITION STATUS      Continent Diet (Heart healthy)  AMBULATORY STATUS COMMUNICATION OF NEEDS Skin   Extensive Assist Verbally Normal                       Personal Care Assistance Level of Assistance  Bathing, Feeding, Dressing Bathing Assistance: Limited assistance Feeding assistance: Independent Dressing Assistance: Limited assistance     Functional Limitations Info  Sight, Hearing, Speech Sight Info: Impaired Hearing Info: Adequate Speech Info: Adequate    SPECIAL CARE FACTORS FREQUENCY  PT (By licensed PT), OT (By licensed OT)     PT Frequency: 5 times weekly OT Frequency: 5 times  weekly            Contractures Contractures Info: Not present    Additional Factors Info  Code Status, Allergies Code Status Info: FULL Allergies Info: NKA           Current Medications (07/29/2021):  This is the current hospital active medication list Current Facility-Administered Medications  Medication Dose Route Frequency Provider Last Rate Last Admin   0.9 %  sodium chloride infusion   Intravenous Continuous Zierle-Ghosh, Asia B, DO 100 mL/hr at 07/29/21 0001 New Bag at 07/29/21 0001   acetaminophen (TYLENOL) tablet 650 mg  650 mg Oral Q6H PRN Zierle-Ghosh, Asia B, DO       Or   acetaminophen (TYLENOL) suppository 650 mg  650 mg Rectal Q6H PRN Zierle-Ghosh, Asia B, DO       amLODipine (NORVASC) tablet 5 mg  5 mg Oral Daily Zierle-Ghosh, Asia B, DO   5 mg at 07/29/21 0858   cefTRIAXone (ROCEPHIN) 1 g in sodium chloride 0.9 % 100 mL IVPB  1 g Intravenous Q24H Zierle-Ghosh, Asia B, DO   Stopped at 07/28/21 2145   Chlorhexidine Gluconate Cloth 2 % PADS 6 each  6 each Topical Daily Zierle-Ghosh, Asia B, DO   6 each at 07/29/21 1003   heparin injection 5,000 Units  5,000 Units Subcutaneous Q8H Zierle-Ghosh, Asia B, DO   5,000 Units at 07/29/21 0620   latanoprost (XALATAN) 0.005 % ophthalmic solution 1 drop  1 drop Both Eyes QHS Zierle-Ghosh, Asia B, DO  1 drop at 07/28/21 2313   melatonin tablet 6 mg  6 mg Oral QHS Zierle-Ghosh, Asia B, DO   6 mg at 07/28/21 2111   metoprolol tartrate (LOPRESSOR) tablet 25 mg  25 mg Oral BID Zierle-Ghosh, Asia B, DO   25 mg at 07/29/21 0858   morphine (PF) 2 MG/ML injection 2 mg  2 mg Intravenous Q2H PRN Zierle-Ghosh, Asia B, DO       ondansetron (ZOFRAN) tablet 4 mg  4 mg Oral Q6H PRN Zierle-Ghosh, Asia B, DO       Or   ondansetron (ZOFRAN) injection 4 mg  4 mg Intravenous Q6H PRN Zierle-Ghosh, Asia B, DO       oxyCODONE (Oxy IR/ROXICODONE) immediate release tablet 5 mg  5 mg Oral Q4H PRN Zierle-Ghosh, Asia B, DO       polyethylene glycol  (MIRALAX / GLYCOLAX) packet 17 g  17 g Oral Daily PRN Zierle-Ghosh, Asia B, DO       pravastatin (PRAVACHOL) tablet 40 mg  40 mg Oral q1800 Zierle-Ghosh, Asia B, DO   40 mg at 07/28/21 1717   tamsulosin (FLOMAX) capsule 0.4 mg  0.4 mg Oral Daily Zierle-Ghosh, Asia B, DO   0.4 mg at 07/29/21 5465     Discharge Medications: Please see discharge summary for a list of discharge medications.  Relevant Imaging Results:  Relevant Lab Results:   Additional Information SSN: [id number], [place]

## 2021-07-29 NOTE — TOC Initial Note (Signed)
Transition of Care [facility]) - Initial/Assessment Note    Patient Details  Name: Terrance Miller MRN: [id number] Date of Birth: 06-Apr-1928  [facility] CM/SW Contact:    Iona Beard, [place] Phone Number: 07/29/2021, 1:15 PM  Clinical Narrative:                 [facility] consulted for HH/DME needs. CSW noted that PT is recommending SNF at D/C. CSW spoke with family in room to complete assessment. Pt lives with his daughter. Pt needs assistance with ADLs. Pt has transportation provided by family when needed. Pt has a cane and walker to use when needed.   CSW spoke with pts daughter Terrance Miller about PT recommendation. Family is agreeable to SNF and would like referral sent to the local facilities. Family first choice is [facility]. CSW explained referral will be sent out and will follow up with bed offers. TOC to follow.   Expected Discharge Plan: Skilled Nursing Facility Barriers to Discharge: Continued Medical Work up   Patient Goals and CMS Choice Patient states their goals for this hospitalization and ongoing recovery are:: go to SNF CMS Medicare.gov Compare Post Acute Care list provided to:: Patient Choice offered to / list presented to : Patient, Adult Children  Expected Discharge Plan and Services Expected Discharge Plan: [place] In-house Referral: Clinical Social Work Discharge Planning Services: CM Consult Post Acute Care Choice: [place] Living arrangements for the past 2 months: Single Family Home                                      Prior Living Arrangements/Services Living arrangements for the past 2 months: Single Family Home Lives with:: Adult Children Patient language and need for interpreter reviewed:: Yes Do you feel safe going back to the place where you live?: Yes      Need for Family Participation in Patient Care: Yes (Comment) Care giver support system in place?: Yes (comment)   Criminal Activity/Legal Involvement  Pertinent to Current Situation/Hospitalization: No - Comment as needed  Activities of Daily Living Home Assistive Devices/Equipment: Walker (specify type) ADL Screening (condition at time of admission) Patient's cognitive ability adequate to safely complete daily activities?: Yes Is the patient deaf or have difficulty hearing?: No Does the patient have difficulty seeing, even when wearing glasses/contacts?: No Does the patient have difficulty concentrating, remembering, or making decisions?: No Patient able to express need for assistance with ADLs?: Yes Does the patient have difficulty dressing or bathing?: Yes Independently performs ADLs?: No Communication: Needs assistance Is this a change from baseline?: Pre-admission baseline Dressing (OT): Needs assistance Is this a change from baseline?: Pre-admission baseline Grooming: Needs assistance Is this a change from baseline?: Pre-admission baseline Feeding: Needs assistance Is this a change from baseline?: Pre-admission baseline Bathing: Needs assistance Is this a change from baseline?: Pre-admission baseline Toileting: Needs assistance Is this a change from baseline?: Pre-admission baseline In/Out Bed: Needs assistance Is this a change from baseline?: Pre-admission baseline Walks in Home: Needs assistance Is this a change from baseline?: Pre-admission baseline Does the patient have difficulty walking or climbing stairs?: No Weakness of Legs: Both Weakness of Arms/Hands: None  Permission Sought/Granted                  Emotional Assessment Appearance:: Appears stated age Attitude/Demeanor/Rapport: Engaged Affect (typically observed): Accepting Orientation: : Oriented to Self, Oriented to Place, Oriented  to  Time, Oriented to Situation Alcohol / Substance Use: Not Applicable Psych Involvement: No (comment)  Admission diagnosis:  AKI (acute kidney injury) ([place]) [S96.2] Toxic metabolic encephalopathy [E36.6] Acute  metabolic encephalopathy [Q94.76] Patient Active Problem List   Diagnosis Date Noted   Acute metabolic encephalopathy 54/65/0354   UTI (urinary tract infection) 07/27/2021   AKI (acute kidney injury) ([place]) 07/27/2021   Macrocytic anemia 05/31/2021   History of MI (myocardial infarction) 04/29/2021   Hydrocele 10/26/2020   Hypothermia 05/20/2017   Hypertension 09/28/2013   Hyperlipidemia LDL goal <70 09/28/2013   CAD (coronary artery disease) 09/28/2013   PCP:  Coral Spikes, DO Pharmacy:   [facility] [place] - [place] [address] Phone: [phone] Fax: [phone]  [facility] - [place], [place] [address] Phone: [phone] Fax: [phone]     Social Determinants of Health (SDOH) Interventions    Readmission Risk Interventions     No data to display

## 2021-07-29 NOTE — Plan of Care (Signed)
  Problem: Acute Rehab PT Goals(only PT should resolve) Goal: Pt will Roll Supine to Side Outcome: Progressing Flowsheets (Taken 07/29/2021 1245) Pt will Roll Supine to Side: with min assist Goal: Pt Will Go Supine/Side To Sit Outcome: Progressing Flowsheets (Taken 07/29/2021 1245) Pt will go Supine/Side to Sit: with minimal assist Goal: Pt Will Go Sit To Supine/Side Outcome: Progressing Flowsheets (Taken 07/29/2021 1245) Pt will go Sit to Supine/Side: with minimal assist Goal: Patient Will Perform Sitting Balance Outcome: Progressing Flowsheets (Taken 07/29/2021 1245) Patient will perform sitting balance:  with minimal assist  with bilateral UE support  1-2 min Goal: Patient Will Transfer Sit To/From Stand Outcome: Progressing Flowsheets (Taken 07/29/2021 1245) Patient will transfer sit to/from stand: with minimal assist Goal: Pt Will Transfer Bed To Chair/Chair To Bed Outcome: Progressing Flowsheets (Taken 07/29/2021 1245) Pt will Transfer Bed to Chair/Chair to Bed: with min assist   Pamala Hurry D. Hartnett-Rands, MS, PT Per [place] [phone] 07/29/2021

## 2021-07-29 NOTE — Progress Notes (Signed)
Progress Note   Patient: Terrance Miller IOX:[id number] DOB: [birth year] DOA: 07/27/2021     2 DOS: the patient was seen and examined on 07/29/2021   Brief hospital course: As per H&P written by Dr. Clearence Ped on 07/27/21 Hershy Flenner is a 88 y.o. male with medical history significant of coronary artery disease, diabetes mellitus without complication-diet controlled, hyperlipidemia, hypertension presents to ED with a chief complaint of altered mental status.  Patient is unfortunately not able to answer any questions appropriately at this time.  Daughter at bedside reports that she brought him in because he was not getting any better on antibiotic prescribed outpatient.  She was on to explain that patient had a UTI several years ago and that she was confused and hallucinating.  Last week he started having hallucinations and being confused again.  He sees kids, relatives that have passed, and dogs that are not there.  He is also been fatigue, malaise, and has reversed his day/night schedule.  He has not complained of dysuria, or hematuria.  She reports his urine has not been malodorous, but it smells malodorous in the room.  Patient is almost finished with his Bactrim, and has not had any improvement in his mental status.  He has no fevers.  He has normal appetite.  She reports he has had bilateral leg weakness and 2 falls where his legs just gave out and he falls down to the ground.  He did not hit his head either time.  He did not lose consciousness.  She does not remember him complaining of chest pain palpitations before the falls.  He did 1 day stay he was dizzy before the fall, but he is not able to provide any details about that at this time.  No other complaints reported at this time.   Patient does not smoke, does not drink alcohol, does not use illicit drugs.  He is not vaccinated for COVID.  She reports that he would want to be full code.  Assessment and Plan: AKI (acute kidney injury)  ([place]) -Creatinine up to 2.68 at time of admission -per GFR stage 3b at baseline; Cr Baseline 1.67-1.8 -Secondary to Bactrim, dehydration and infection most likely -continue minimizing nephrotoxic agents, avoid hypotension and the use of contrast. -Continue adequate hydration -Follow renal function trend with repeat treatment in a.m.   UTI (urinary tract infection) -Recently diagnosed with UTI and treated with Bactrim (at least partially) -Continue current use of Rocephin and follow culture results. -Maintain adequate hydration -Reports no dysuria currently; but complaining of frequency and is still experiencing metabolic encephalopathy..   Acute metabolic encephalopathy - Multifactorial; in the setting of sundowning, acute UTI infection and possible medications.  -Minimize sedative agents -Continue constant reorientation and supportive care -Maintain adequate hydration and continue treatment for UTI -Normal TSH and B12 appreciated. -CT head without acute intracranial abnormalities. -On today's evaluation patient was oriented x2 intermittently; but is still vividly demonstrating ongoing hallucinations and per family mentation back to baseline.. -Low-dose Seroquel nightly will be initiated.   Hydrocele -Chronic -Urology consulted from the ED and reports that there are no acute changes and that no urology intervention required at this time. -CT renal study demonstrating no acute findings in the abdomen or pelvis. -Continue to monitor. -Outpatient follow-up with urology as needed.  Hypothermia -Secondary to infection -Vital signs and temperature has stabilized -Patient no longer needing bear hugger   Hyperlipidemia LDL goal <70 -Continue statins. -Heart healthy diet discussed with patient.  Hypertension -Stable overall -Will  continue the use of metoprolol and amlodipine. -Follow-up vital signs.   Class I obesity -Low calorie diet and portion control discussed with  patient -Body mass index is 34.98 kg/m.   Subjective:  Oriented x2 intermittently; following simple commands.  No requiring oxygen supplementation.  No fever, no nausea, no vomiting.  Still demonstrating ongoing intermittent hallucinations and per family mentation now back to baseline.  He is generally weak and deconditioned.  Physical Exam: Vitals:   07/28/21 2300 07/29/21 0443 07/29/21 0858 07/29/21 1440  BP: (!) 146/76 (!) 156/80  134/64  Pulse: 62 (!) 53 62 (!) 51  Resp:  18  (!) 22  Temp:  97.8 F (36.6 C)  98.2 F (36.8 C)  TempSrc:  Oral  Oral  SpO2:  100%  99%  Weight:      Height:       General exam: Alert, awake, oriented x 2 intermittently; per family at bedside patient's mentation still not back to baseline.  He continue to report intermittent episodes of hallucinations and vividly seen deceased family members.  No fevers, no nausea, no vomiting, no chest pain, no shortness of breath.  Reporting increased frequency. Respiratory system: Clear to auscultation. Respiratory effort normal.  No using accessory muscle. Cardiovascular system:RRR. No rubs or gallops; no JVD. Gastrointestinal system: Abdomen is nondistended, soft and nontender. No organomegaly or masses felt. Normal bowel sounds heard. Central nervous system: Generally weak; no focal deficits appreciated. Extremities: No cyanosis or clubbing. Skin: No petechiae. Psychiatry: Judgement and insight appear impaired in the setting of acute encephalopathy.  Per family mentation is still not back to baseline.  Prior to admission and acute illness presentation patient was able to perform most of his activities of daily living independently as per family report.  Data Reviewed: Urine culture pending (so far no growth) TSH within normal limits. Blood cultures without any growth (final results pending).  Family Communication: no family at bedside.  Disposition: Status is: Inpatient Remains inpatient appropriate  because: receiving IV antibiotics, mentation still not back to baseline and having hypothermia.   Planned Discharge Destination: Home   Author: Barton Dubois, MD 07/29/2021 5:41 PM  For on call review [email or web address].

## 2021-07-29 NOTE — Care Management Important Message (Signed)
Important Message  Patient Details  Name: Terrance Miller MRN: [id number] Date of Birth: 1928/03/05   Medicare Important Message Given:  N/A - LOS <3 / Initial given by admissions     [name] 07/29/2021, 11:47 AM

## 2021-07-30 ENCOUNTER — Ambulatory Visit: Payer: Medicaid Other | Admitting: Cardiology

## 2021-07-30 DIAGNOSIS — G928 Other toxic encephalopathy: Secondary | ICD-10-CM | POA: Diagnosis not present

## 2021-07-30 DIAGNOSIS — R059 Cough, unspecified: Secondary | ICD-10-CM | POA: Diagnosis not present

## 2021-07-30 DIAGNOSIS — N433 Hydrocele, unspecified: Secondary | ICD-10-CM | POA: Diagnosis not present

## 2021-07-30 DIAGNOSIS — G9341 Metabolic encephalopathy: Secondary | ICD-10-CM | POA: Diagnosis not present

## 2021-07-30 DIAGNOSIS — E785 Hyperlipidemia, unspecified: Secondary | ICD-10-CM | POA: Diagnosis not present

## 2021-07-30 LAB — BASIC METABOLIC PANEL
Anion gap: 6 (ref 5–15)
BUN: 27 mg/dL — ABNORMAL HIGH (ref 8–23)
CO2: 20 mmol/L — ABNORMAL LOW (ref 22–32)
Calcium: 9.4 mg/dL (ref 8.9–10.3)
Chloride: 114 mmol/L — ABNORMAL HIGH (ref 98–111)
Creatinine, Ser: 1.96 mg/dL — ABNORMAL HIGH (ref 0.61–1.24)
GFR, Estimated: 32 mL/min — ABNORMAL LOW (ref >60)
Glucose, Bld: 83 mg/dL (ref 70–99)
Potassium: 4.5 mmol/L (ref 3.5–5.1)
Sodium: 140 mmol/L (ref 135–145)

## 2021-07-30 LAB — CBC
HCT: 35.3 % — ABNORMAL LOW (ref 39.0–52.0)
Hemoglobin: 11.5 g/dL — ABNORMAL LOW (ref 13.0–17.0)
MCH: 33.9 pg (ref 26.0–34.0)
MCHC: 32.6 g/dL (ref 30.0–36.0)
MCV: 104.1 fL — ABNORMAL HIGH (ref 80.0–100.0)
Platelets: 141 10*3/uL — ABNORMAL LOW (ref 150–400)
RBC: 3.39 MIL/uL — ABNORMAL LOW (ref 4.22–5.81)
RDW: 17.9 % — ABNORMAL HIGH (ref 11.5–15.5)
WBC: 7 10*3/uL (ref 4.0–10.5)
nRBC: 0.4 % — ABNORMAL HIGH (ref 0.0–0.2)

## 2021-07-30 MED ORDER — BISACODYL 10 MG RE SUPP
10.0000 mg | Freq: Every day | RECTAL | Status: DC | PRN
Start: 2021-07-30 — End: 2021-07-31
  Administered 2021-07-30: 10 mg via RECTAL
  Filled 2021-07-30: qty 1

## 2021-07-30 NOTE — Progress Notes (Signed)
Progress Note   Patient: Terrance Miller KPT:[id number] DOB: [birth year] DOA: 07/27/2021     3 DOS: the patient was seen and examined on 07/30/2021   Brief hospital course: As per H&P written by Dr. Clearence Ped on 07/27/21 Terrance Miller is a 88 y.o. male with medical history significant of coronary artery disease, diabetes mellitus without complication-diet controlled, hyperlipidemia, hypertension presents to ED with a chief complaint of altered mental status.  Patient is unfortunately not able to answer any questions appropriately at this time.  Daughter at bedside reports that she brought him in because he was not getting any better on antibiotic prescribed outpatient.  She was on to explain that patient had a UTI several years ago and that she was confused and hallucinating.  Last week he started having hallucinations and being confused again.  He sees kids, relatives that have passed, and dogs that are not there.  He is also been fatigue, malaise, and has reversed his day/night schedule.  He has not complained of dysuria, or hematuria.  She reports his urine has not been malodorous, but it smells malodorous in the room.  Patient is almost finished with his Bactrim, and has not had any improvement in his mental status.  He has no fevers.  He has normal appetite.  She reports he has had bilateral leg weakness and 2 falls where his legs just gave out and he falls down to the ground.  He did not hit his head either time.  He did not lose consciousness.  She does not remember him complaining of chest pain palpitations before the falls.  He did 1 day stay he was dizzy before the fall, but he is not able to provide any details about that at this time.  No other complaints reported at this time.   Patient does not smoke, does not drink alcohol, does not use illicit drugs.  He is not vaccinated for COVID.  She reports that he would want to be full code.  Assessment and Plan: Cough - Patient experiencing coughing  spells -No fever -Will check chest x-ray -Adjust fluid resuscitation. -Wheezing or crackles on exam; but positive appreciated rhonchi.  AKI (acute kidney injury) ([place]) -Creatinine up to 2.68 at time of admission -per GFR stage 3b at baseline; Cr Baseline 1.67-1.8 -Secondary to Bactrim, dehydration and infection most likely -continue minimizing nephrotoxic agents, avoid hypotension and the use of contrast. -Continue adequate hydration -Creatinine down to 0.96 (close to baseline); follow trend.   UTI (urinary tract infection) -Recently diagnosed with UTI and treated with Bactrim (at least partially) as an outpatient. -Continue current use of Rocephin and urine culture without growth. -Continue to maintain adequate hydration -Reports no dysuria currently; but complaining of frequency and is still experiencing metabolic encephalopathy.. -Planning for another 24 hours with IV antibiotics prior to oral transition.   Acute metabolic encephalopathy - Multifactorial; in the setting of sundowning, acute UTI infection and possible medications.  -Continue to minimize sedative agents -Continue constant reorientation and supportive care. -Continue to maintain adequate hydration and continue treatment for UTI; planning for another 24 hours of IV antibiotics prior to transition to oral route using cefdinir 300 mg every 12 hours. -Normal TSH and B12 appreciated. -CT head without acute intracranial abnormalities. -Family report mentation still not back to baseline; but expressed some improvement. -Continue low-dose Seroquel and follow clinical response.   Hydrocele -Chronic -Urology consulted from the ED and reports that there are no acute changes and that no urology intervention required at  this time. -CT renal study demonstrating no acute findings in the abdomen or pelvis. -Continue to monitor. -Outpatient follow-up with urology as needed.  Hypothermia -Secondary to infection at time of  admission most likely. -Vital signs and temperature has stabilized -Patient no longer needing bear hugger   Hyperlipidemia LDL goal <70 -Continue statins. -Heart healthy diet discussed with patient.  Hypertension -Stable overall -Will continue the use of metoprolol and amlodipine. -Continue to follow-up vital signs.  -Heart healthy diet discussed with patient and family at bedside.  Class I obesity -Low calorie diet and portion control discussed with patient -Body mass index is 34.98 kg/m.   Subjective:  Weak, deconditioned and afebrile; no chest pain, no nausea, no vomiting.  Per family some improvement appreciated on today's exam; but mentation is still not back to baseline.  Patient reports some intermittent coughing spells and increased frequency.  No dysuria.  Physical Exam: Vitals:   07/29/21 1440 07/29/21 2120 07/30/21 0429 07/30/21 1254  BP: 134/64 (!) 148/80 (!) 161/71 (!) 145/78  Pulse: (!) 51 (!) 51 (!) 55 (!) 59  Resp: (!) '22 18 16 18  '$ Temp: 98.2 F (36.8 C) 98.5 F (36.9 C) 97.9 F (36.6 C) 98 F (36.7 C)  TempSrc: Oral   Oral  SpO2: 99% 100% 100% 99%  Weight:      Height:       General exam: Alert, awake, oriented x 2 intermittently; family continue reporting that mentation is now back to baseline (but today's express some improvement).  Weak and severely deconditioned.  No nausea, no vomiting, no fever.  Intermittent coughing spells appreciated on today's evaluation. Respiratory system: Positive scattered rhonchi; no wheezing, no crackles, no using accessory muscle.  Normal respiratory rate. Cardiovascular system: Rate controlled, no rubs, no gallops, no JVD. Gastrointestinal system: Abdomen is nondistended, soft and nontender. No organomegaly or masses felt. Normal bowel sounds heard. Central nervous system: No focal neurological deficits. Extremities: No cyanosis or clubbing; no edema on exam. Skin: No petechiae. Psychiatry: Judgement and insight appear  impaired secondary to encephalopathy. Mood is stable.   Data Reviewed: Urine culture no growth appreciated (patient received partial treatment with Bactrim prior to admission).   CBC: WBC 7.0, hemoglobin 11.5, MCV 104.1, platelet count 867 K Basic metabolic panel: Sodium 619, potassium 4.5, chloride 114, bicarb 20, BUN 27 and creatinine 1.96 TSH within normal limits. B12: 703  Family Communication: no family at bedside.  Disposition: Status is: Inpatient Remains inpatient appropriate because: receiving IV antibiotics, mentation still not back to baseline and having hypothermia.   Planned Discharge Destination: Home   Author: Barton Dubois, MD 07/30/2021 6:18 PM  For on call review [email or web address].

## 2021-07-30 NOTE — TOC Progression Note (Signed)
Transition of Care [facility]) - Progression Note    Patient Details  Name: Terrance Miller MRN: [id number] Date of Birth: 24-May-1928  Transition of Care [facility]) CM/SW Contact  Salome Arnt,  Phone Number: 07/30/2021, 10:57 AM  Clinical Narrative: Pt's daughter chooses bed at [facility]. Anticipate possible d/c tomorrow per MD. Facility notified. Auth received [id number], expires 08/01/21).       Expected Discharge Plan: [place] Barriers to Discharge: Continued Medical Work up  Expected Discharge Plan and Services Expected Discharge Plan: [place] In-house Referral: Clinical Social Work Discharge Planning Services: CM Consult Post Acute Care Choice: [place] arrangements for the past 2 months: Single Family Home                                       Social Determinants of Health (SDOH) Interventions    Readmission Risk Interventions     No data to display

## 2021-07-30 NOTE — Care Management Important Message (Signed)
Important Message  Patient Details  Name: Terrance Miller MRN: [id number] Date of Birth: 1928/07/29   Medicare Important Message Given:  Yes     [name] 07/30/2021, 10:37 AM

## 2021-07-30 NOTE — Assessment & Plan Note (Signed)
-   Patient experiencing coughing spells -No fever -Will check chest x-ray -Adjust fluid resuscitation. -Wheezing or crackles on exam; but positive appreciated rhonchi.

## 2021-07-31 ENCOUNTER — Inpatient Hospital Stay (HOSPITAL_COMMUNITY): Payer: Medicare Other

## 2021-07-31 DIAGNOSIS — I2510 Atherosclerotic heart disease of native coronary artery without angina pectoris: Secondary | ICD-10-CM | POA: Diagnosis not present

## 2021-07-31 DIAGNOSIS — Z743 Need for continuous supervision: Secondary | ICD-10-CM | POA: Diagnosis not present

## 2021-07-31 DIAGNOSIS — R6889 Other general symptoms and signs: Secondary | ICD-10-CM | POA: Diagnosis not present

## 2021-07-31 DIAGNOSIS — I10 Essential (primary) hypertension: Secondary | ICD-10-CM | POA: Diagnosis not present

## 2021-07-31 DIAGNOSIS — G9341 Metabolic encephalopathy: Secondary | ICD-10-CM | POA: Diagnosis not present

## 2021-07-31 DIAGNOSIS — F01B2 Vascular dementia, moderate, with psychotic disturbance: Secondary | ICD-10-CM | POA: Diagnosis not present

## 2021-07-31 DIAGNOSIS — E559 Vitamin D deficiency, unspecified: Secondary | ICD-10-CM | POA: Diagnosis not present

## 2021-07-31 DIAGNOSIS — F0670 Mild neurocognitive disorder due to known physiological condition without behavioral disturbance: Secondary | ICD-10-CM | POA: Diagnosis not present

## 2021-07-31 DIAGNOSIS — E785 Hyperlipidemia, unspecified: Secondary | ICD-10-CM | POA: Diagnosis not present

## 2021-07-31 DIAGNOSIS — M6281 Muscle weakness (generalized): Secondary | ICD-10-CM | POA: Diagnosis not present

## 2021-07-31 DIAGNOSIS — I252 Old myocardial infarction: Secondary | ICD-10-CM | POA: Diagnosis not present

## 2021-07-31 DIAGNOSIS — H409 Unspecified glaucoma: Secondary | ICD-10-CM | POA: Diagnosis not present

## 2021-07-31 DIAGNOSIS — E119 Type 2 diabetes mellitus without complications: Secondary | ICD-10-CM | POA: Diagnosis not present

## 2021-07-31 DIAGNOSIS — R2689 Other abnormalities of gait and mobility: Secondary | ICD-10-CM | POA: Diagnosis not present

## 2021-07-31 DIAGNOSIS — R0602 Shortness of breath: Secondary | ICD-10-CM | POA: Diagnosis not present

## 2021-07-31 DIAGNOSIS — N179 Acute kidney failure, unspecified: Secondary | ICD-10-CM | POA: Diagnosis not present

## 2021-07-31 DIAGNOSIS — N390 Urinary tract infection, site not specified: Secondary | ICD-10-CM | POA: Diagnosis not present

## 2021-07-31 DIAGNOSIS — G929 Unspecified toxic encephalopathy: Secondary | ICD-10-CM | POA: Diagnosis not present

## 2021-07-31 LAB — BASIC METABOLIC PANEL
Anion gap: 8 (ref 5–15)
BUN: 30 mg/dL — ABNORMAL HIGH (ref 8–23)
CO2: 17 mmol/L — ABNORMAL LOW (ref 22–32)
Calcium: 8.6 mg/dL — ABNORMAL LOW (ref 8.9–10.3)
Chloride: 114 mmol/L — ABNORMAL HIGH (ref 98–111)
Creatinine, Ser: 2.01 mg/dL — ABNORMAL HIGH (ref 0.61–1.24)
GFR, Estimated: 31 mL/min — ABNORMAL LOW (ref >60)
Glucose, Bld: 81 mg/dL (ref 70–99)
Potassium: 4.3 mmol/L (ref 3.5–5.1)
Sodium: 139 mmol/L (ref 135–145)

## 2021-07-31 LAB — CBC
HCT: 30.2 % — ABNORMAL LOW (ref 39.0–52.0)
Hemoglobin: 10 g/dL — ABNORMAL LOW (ref 13.0–17.0)
MCH: 34.6 pg — ABNORMAL HIGH (ref 26.0–34.0)
MCHC: 33.1 g/dL (ref 30.0–36.0)
MCV: 104.5 fL — ABNORMAL HIGH (ref 80.0–100.0)
Platelets: 128 10*3/uL — ABNORMAL LOW (ref 150–400)
RBC: 2.89 MIL/uL — ABNORMAL LOW (ref 4.22–5.81)
RDW: 17.7 % — ABNORMAL HIGH (ref 11.5–15.5)
WBC: 8.5 10*3/uL (ref 4.0–10.5)
nRBC: 0.6 % — ABNORMAL HIGH (ref 0.0–0.2)

## 2021-07-31 MED ORDER — QUETIAPINE FUMARATE 25 MG PO TABS: 12.5000 mg | ORAL_TABLET | Freq: Every day | ORAL | 0 refills | Status: DC

## 2021-07-31 NOTE — Progress Notes (Signed)
Patient discharged to [facility] today, transported by EMS to facility. Discharge paperwork placed in discharge packet to give to receiving facility. Belongings sent with patient. Patient stable at discharge.

## 2021-07-31 NOTE — TOC Transition Note (Signed)
Transition of Care [facility]) - CM/SW Discharge Note   Patient Details  Name: Terrance Miller MRN: [id number] Date of Birth: [birth year]  Transition of Care [facility]) CM/SW Contact:  Iona Beard, [place] Phone Number: 07/31/2021, 12:53 PM   Clinical Narrative:    CSW updated that pt is medically stable for D/C to [facility]. CSW updated [name] in admissions who states they are ready to accept. CSW updated RN of room and report numbers. CSW completed med necessity and sent it to the floor. CSW to call for EMS transport. CSW updated pts daughter Ms. Eulas Post of plan for D/C to SNF. TOC signing off.   Final next level of care: Skilled Nursing Facility Barriers to Discharge: Barriers Resolved   Patient Goals and CMS Choice Patient states their goals for this hospitalization and ongoing recovery are:: go to SNF CMS Medicare.gov Compare Post Acute Care list provided to:: Patient Choice offered to / list presented to : Patient, Adult Children  Discharge Placement              Patient chooses bed at: [facility] Patient to be transferred to facility by: EMS Name of family member notified: Jackelyn Hoehn Patient and family notified of of transfer: 07/31/21  Discharge Plan and Services In-house Referral: Clinical Social Work Discharge Planning Services: CM Consult Post Acute Care Choice: [place]                               Social Determinants of Health (SDOH) Interventions     Readmission Risk Interventions     No data to display

## 2021-07-31 NOTE — Discharge Summary (Signed)
Physician Discharge Summary  Terrance Miller JGG:[id number] DOB: 10-Jul-1928 DOA: 07/27/2021  PCP: Coral Spikes, DO  Admit date: 07/27/2021  Discharge date: 07/31/2021  Admitted From:Home  Disposition:  SNF  Recommendations for Outpatient Follow-up:  Follow up with PCP in 1-2 weeks and follow-up BMP Consider referral to urology as needed for any concerns related to hydrocele, already evaluated by urology while in ED with no acute concerns noted No further need for antibiotics as patient has completed 3-day course for UTI Continue home medications as prior Continue Seroquel at bedtime as ordered  Home Health: None  Equipment/Devices: None  Discharge Condition:Stable  CODE STATUS: Full  Diet recommendation: Heart Healthy/carb modified  Brief/Interim Summary: Terrance Miller is a 88 y.o. male with medical history significant of coronary artery disease, diabetes mellitus without complication-diet controlled, hyperlipidemia, hypertension presents to ED with a chief complaint of altered mental status.  Patient was admitted with acute metabolic encephalopathy in the setting of acute UTI with no growth noted on urine cultures.  He was treated empirically with IV Rocephin with gradual improvement in his condition, but did have some episodes of sundowning noted as well as delirium for which he was started on Seroquel.  He has tolerated this well and has improvement in his overall cognition back to baseline.  Additionally, he was noted to have AKI which has improved back to baseline and will need to be trended with his PCP in the outpatient setting.  Finally, he was noted to have a hydrocele which urology evaluated in the ED with no intervention required at this time.  He may follow-up with urology as needed if further work-up needed.  Discharge Diagnoses:  Active Problems:   Hypertension   Hyperlipidemia LDL goal <70   Hypothermia   Hydrocele   Acute metabolic encephalopathy   UTI (urinary tract  infection)   AKI (acute kidney injury) ([place])   Cough  Principal discharge diagnosis: Acute metabolic encephalopathy secondary to UTI with the presence of delirium.  Mild AKI.  Discharge Instructions  Discharge Instructions     Diet - low sodium heart healthy   Complete by: As directed    Increase activity slowly   Complete by: As directed       Allergies as of 07/31/2021   No Known Allergies      Medication List     STOP taking these medications    sulfamethoxazole-trimethoprim 800-160 MG tablet Commonly known as: BACTRIM DS       TAKE these medications    amLODipine 5 MG tablet Commonly known as: NORVASC Take 1 tablet (5 mg total) by mouth daily.   cholecalciferol 1000 units tablet Commonly known as: VITAMIN D Take 1,000 Units by mouth daily.   meclizine 25 MG tablet Commonly known as: ANTIVERT Take 25 mg by mouth daily as needed for dizziness.   metoprolol tartrate 25 MG tablet Commonly known as: LOPRESSOR Take 1 tablet (25 mg total) by mouth 2 (two) times daily.   pravastatin 40 MG tablet Commonly known as: PRAVACHOL Take 1 tablet (40 mg total) by mouth daily.   QUEtiapine 25 MG tablet Commonly known as: SEROQUEL Take 0.5 tablets (12.5 mg total) by mouth at bedtime.   tamsulosin 0.4 MG Caps capsule Commonly known as: FLOMAX Take 0.4 mg by mouth daily.   Travatan Z 0.004 % Soln ophthalmic solution Generic drug: Travoprost (BAK Free) Place 1 drop into both eyes 2 (two) times daily.        Contact information for follow-up  providers     Coral Spikes, DO. Schedule an appointment as soon as possible for a visit in 2 week(s).   Specialty: Family Medicine Contact information: [place] [address] [phone]              Contact information for after-discharge care     [facility] .   Service: Skilled Nursing Contact information: [address] [place] [phone]                    No Known Allergies  Consultations: None   Procedures/Studies: DG Chest 2 View  Result Date: 07/31/2021 CLINICAL DATA:  Altered mental status.  Shortness of breath. EXAM: CHEST - 2 VIEW COMPARISON:  Chest two views 07/27/2021, AP chest 05/21/2017 FINDINGS: There are again moderately decreased lung volumes. Cardiac silhouette is again moderately enlarged. Mediastinal contours are grossly within normal limits with mild calcification again seen within aortic arch. Right mid and lower lung and left lower lung horizontal linear densities including interstitial thickening and bronchovascular crowding are similar to multiple prior studies. There is again cephalization of the pulmonary vasculature. No overt pulmonary edema. No pleural effusion or pneumothorax. There are again numerous small round metallic densities overlying the left chest wall and left upper arm likely metallic BBs. Moderate multilevel degenerative disc changes of the thoracic spine. IMPRESSION: 1. Stable moderately enlarged cardiac silhouette. 2. Decreased lung volumes with right mid and lower lung and left lower lung interstitial thickening/scarring. This is similar to multiple prior radiographs and chronic scarring is favored over interstitial pulmonary edema. Electronically Signed   By: Yvonne Kendall M.D.   On: 07/31/2021 09:24   US SCROTUM W/DOPPLER  Result Date: 07/28/2021 CLINICAL DATA:  Hydrocele seen 07/17/2021 by ultrasound for follow-up. EXAM: SCROTAL ULTRASOUND DOPPLER ULTRASOUND OF THE TESTICLES TECHNIQUE: Complete ultrasound examination of the testicles, epididymis, and other scrotal structures was performed. Color and spectral Doppler ultrasound were also utilized to evaluate blood flow to the testicles. COMPARISON:  07/17/2021 and 11/15/2020 FINDINGS: Right testicle Measurements: 5.7 x 1.7 x 3.3 cm. No mass or microlithiasis visualized. Left testicle  Measurements: 4.7 x 1.7 x 2.4 cm. No mass or microlithiasis visualized. Right epididymis:  Not visualized. Left epididymis:  Not visualized. Hydrocele: Large minimally complicated right-sided hydrocele with uniform bloating punctate echogenic foci unchanged from November 2022. This appears to compress the associated right testicle unchanged. Moderate size more simple appearing left hydrocele unchanged to slightly improved compared to November 2022. This appears to compress the left testicle unchanged. Hydroceles unchanged from the recent exam from 07/17/2021. Varicocele:  None visualized. Pulsed Doppler interrogation of both testes demonstrates normal low resistance arterial and venous waveforms bilaterally. 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 Epididymis not visualized. Electronically Signed   By: Marin Olp M.D.   On: 07/28/2021 13:03   CT Renal Stone Study  Result Date: 07/27/2021 CLINICAL DATA:  Flank pain, UTI, multiple falls, weakness EXAM: CT ABDOMEN AND PELVIS WITHOUT CONTRAST TECHNIQUE: Multidetector CT imaging of the abdomen and pelvis was performed following the standard protocol without IV contrast. RADIATION DOSE REDUCTION: This exam was performed according to the departmental dose-optimization program which includes automated exposure control, adjustment of the mA and/or kV according to patient size and/or use of iterative reconstruction technique.  COMPARISON:  None Available. FINDINGS: Motion degraded images. Lower chest: Lung bases are essentially clear. Hepatobiliary: Unenhanced liver is grossly unremarkable. Gallbladder is unremarkable. No intrahepatic or extrahepatic ductal dilatation. Pancreas: Within normal limits. Spleen: Within normal limits. Adrenals/Urinary Tract: Adrenal glands are within normal  limits. Bilateral renal cysts, including a dominant 3.4 cm right lower pole renal cyst. Additional subcentimeter hyperdense cyst in the right upper kidney (series 2/image 28). No renal calculi or hydronephrosis. Bladder is underdistended but unremarkable. Stomach/Bowel: Stomach is within normal limits. No evidence of bowel obstruction. Normal appendix (series 2/image 85). No colonic wall thickening or inflammatory changes. Vascular/Lymphatic: No evidence of abdominal aortic aneurysm. Atherosclerotic calcifications of the abdominal aorta and branch vessels. Mild nonspecific lymphatic stranding along the jejunal mesentery (series 2/image 43), without discrete lymphadenopathy. No suspicious abdominopelvic lymphadenopathy. Reproductive: Prostate is unremarkable. Other: No abdominopelvic ascites. Moderate left and small right fat containing inguinal hernias (series 2/image 84). Musculoskeletal: Degenerative changes of the visualized thoracolumbar spine. IMPRESSION: Motion degraded images. No acute findings in the abdomen/pelvis. Additional ancillary findings as above. Electronically Signed   By: Julian Hy M.D.   On: 07/27/2021 19:28   CT Head Wo Contrast  Result Date: 07/27/2021 CLINICAL DATA:  Mental status change over the last 3 days with generalized weakness. EXAM: CT HEAD WITHOUT CONTRAST TECHNIQUE: Contiguous axial images were obtained from the base of the skull through the vertex without intravenous contrast. RADIATION DOSE REDUCTION: This exam was performed according to the departmental dose-optimization program which includes automated exposure control, adjustment of the mA and/or kV according to patient size and/or use of iterative reconstruction technique. COMPARISON:  05/20/2017. FINDINGS: Brain: No evidence of acute infarction, hemorrhage, hydrocephalus, extra-axial collection or mass lesion/mass effect. Age-appropriate volume loss. Mild periventricular white matter hypoattenuation consistent with  chronic microvascular ischemic change. Vascular: No hyperdense vessel or unexpected calcification. Skull: Normal. Negative for fracture or focal lesion. Sinuses/Orbits: Globes and orbits are unremarkable. Near complete opacification of the right maxillary sinus from mucosal thickening and some fluid. Right sphenoid sinus is opacified. Mild right-sided ethmoid sinus mucosal thickening. Other: None. IMPRESSION: 1. No acute intracranial abnormalities. 2. Right-sided sinus disease which may contribute to dizziness/falling. Electronically Signed   By: Lajean Manes M.D.   On: 07/27/2021 18:46   DG Lumbar Spine Complete  Result Date: 07/27/2021 CLINICAL DATA:  Falls EXAM: LUMBAR SPINE - COMPLETE 4+ VIEW COMPARISON:  None Available. FINDINGS: There is no evidence of lumbar spine fracture. Alignment is normal. Moderate multilevel disc space height loss and osteophytosis. Mild multilevel facet degenerative change. Nonobstructive pattern of overlying bowel gas. IMPRESSION: No fracture or dislocation of the lumbar spine. Moderate multilevel disc space height loss and osteophytosis. Electronically Signed   By: Delanna Ahmadi M.D.   On: 07/27/2021 17:36   DG Chest 2 View  Result Date: 07/27/2021 CLINICAL DATA:  Falls.  Altered mental status. EXAM: CHEST - 2 VIEW COMPARISON:  AP chest 05/21/2017 FINDINGS: Cardiac silhouette is again moderately enlarged. Mediastinal contours are grossly within normal limits. Mild calcification within aortic arch. Mildly decreased lung volumes. Bibasilar bronchovascular crowding. Mild chronic bilateral interstitial thickening. Cephalization of the pulmonary vasculature. No pleural effusion or pneumothorax. Numerous round small metallic densities overlie the left chest wall and left upper arm, presumably from metallic BBs. Moderate multilevel degenerative disc changes of the thoracic spine. IMPRESSION: 1. Stable moderately enlarged cardiac silhouette. 2. Chronic bilateral interstitial  thickening may represent interstitial scarring versus less likely chronic mild interstitial pulmonary edema. 3. No focal airspace opacity  to indicate pneumonia. Electronically Signed   By: Yvonne Kendall M.D.   On: 07/27/2021 17:36   US SCROTUM W/DOPPLER  Result Date: 07/19/2021 CLINICAL DATA:  Worsening hydroceles with pain for 14 days EXAM: SCROTAL ULTRASOUND DOPPLER ULTRASOUND OF THE TESTICLES TECHNIQUE: Complete ultrasound examination of the testicles, epididymis, and other scrotal structures was performed. Color and spectral Doppler ultrasound were also utilized to evaluate blood flow to the testicles. COMPARISON:  11/15/2020 FINDINGS: Right testicle Measurements: 5.4 x 1.5 x 3.8 cm. No mass or microlithiasis visualized. Left testicle Measurements: 4.8 x 3 x 4.2 cm. No mass or microlithiasis visualized. Right epididymis:  Not visualized Left epididymis:  Not visualized Hydrocele: Large right hydrocele with debris. Large left hydrocele. Varicocele:  None visualized. Pulsed Doppler interrogation of both testes demonstrates normal low resistance arterial and venous waveforms bilaterally. IMPRESSION: Redemonstrated bilateral large hydroceles compressing the testis and obscuring the epididymi. No significant interval change. Electronically Signed   By: Kathreen Devoid M.D.   On: 07/19/2021 08:46     Discharge Exam: Vitals:   07/31/21 0442 07/31/21 0920  BP: (!) 142/65 (!) 115/54  Pulse: (!) 58 (!) 55  Resp: 15   Temp: 98.2 F (36.8 C)   SpO2: 98%    Vitals:   07/30/21 1254 07/30/21 2013 07/31/21 0442 07/31/21 0920  BP: (!) 145/78 (!) 144/66 (!) 142/65 (!) 115/54  Pulse: (!) 59 61 (!) 58 (!) 55  Resp: '18 16 15   '$ Temp: 98 F (36.7 C) 97.9 F (36.6 C) 98.2 F (36.8 C)   TempSrc: Oral  Oral   SpO2: 99% 97% 98%   Weight:      Height:        General: Pt is alert, awake, not in acute distress Cardiovascular: RRR, S1/S2 +, no rubs, no gallops Respiratory: CTA bilaterally, no wheezing, no  rhonchi Abdominal: Soft, NT, ND, bowel sounds + Extremities: no edema, no cyanosis    The results of significant diagnostics from this hospitalization (including imaging, microbiology, ancillary and laboratory) are listed below for reference.     Microbiology: Recent Results (from the past 240 hour(s))  Urine Culture     Status: None   Collection Time: 07/27/21  4:53 PM   Specimen: Urine, Clean Catch  Result Value Ref Range Status   Specimen Description   Final    URINE, CLEAN CATCH Performed at [facility], [address], [place], [place] [address]    Special Requests   Final    NONE Performed at [facility], [address] [place], [place] [address]    Culture   Final    NO GROWTH Performed at [facility], [place] [address] [place], [place] [address]    Report Status 07/29/2021 FINAL  Final  Culture, blood (Routine X 2) w Reflex to ID Panel     Status: None (Preliminary result)   Collection Time: 07/28/21  1:02 AM   Specimen: BLOOD RIGHT ARM  Result Value Ref Range Status   Specimen Description BLOOD RIGHT ARM  Final   Special Requests   Final    BOTTLES DRAWN AEROBIC ONLY Blood Culture results may not be optimal due to an inadequate volume of blood received in culture bottles   Culture   Final    NO GROWTH 3 DAYS Performed at [facility], [address] [place], [place] [address]    Report Status PENDING  Incomplete  Culture, blood (Routine X 2) w Reflex to ID Panel  Status: None (Preliminary result)   Collection Time: 07/28/21  1:20 AM   Specimen: BLOOD RIGHT HAND  Result Value Ref Range Status   Specimen Description BLOOD RIGHT HAND  Final   Special Requests   Final    BOTTLES DRAWN AEROBIC ONLY Blood Culture results may not be optimal due to an excessive volume of blood received in culture bottles   Culture   Final    NO GROWTH 3 DAYS Performed at [facility], [address], [place], [place] [address]    Report Status PENDING  Incomplete  MRSA  Next Gen by PCR, Nasal     Status: None   Collection Time: 07/29/21  9:25 AM   Specimen: Nasal Mucosa; Nasal Swab  Result Value Ref Range Status   MRSA by PCR Next Gen NOT DETECTED NOT DETECTED Final    Comment: (NOTE) The GeneXpert MRSA Assay (FDA approved for NASAL specimens only), is one component of a comprehensive MRSA colonization surveillance program. It is not intended to diagnose MRSA infection nor to guide or monitor treatment for MRSA infections. Test performance is not FDA approved in patients less than 19 years old. Performed at [facility], [address], [place], [place] [address]      Labs: BNP (last 3 results) No results for input(s): "BNP" in the last 8760 hours. Basic Metabolic Panel: Recent Labs  Lab 07/27/21 1757 07/28/21 0103 07/30/21 0530 07/31/21 0532  NA 136 138 140 139  K 5.0 4.8 4.5 4.3  CL 112* 113* 114* 114*  CO2 19* 20* 20* 17*  GLUCOSE 113* 99 83 81  BUN 40* 37* 27* 30*  CREATININE 2.68* 2.42* 1.96* 2.01*  CALCIUM 8.7* 8.5* 9.4 8.6*  MG  --  2.3  --   --    Liver Function Tests: Recent Labs  Lab 07/27/21 1757 07/28/21 0103  AST 21 24  ALT 18 18  ALKPHOS 48 44  BILITOT 0.7 0.6  PROT 7.5 7.0  ALBUMIN 3.8 3.5   No results for input(s): "LIPASE", "AMYLASE" in the last 168 hours. No results for input(s): "AMMONIA" in the last 168 hours. CBC: Recent Labs  Lab 07/27/21 1757 07/28/21 0103 07/30/21 0530 07/31/21 0532  WBC 5.6 4.5 7.0 8.5  NEUTROABS 3.3 2.3  --   --   HGB 10.8* 9.8* 11.5* 10.0*  HCT 32.9* 29.2* 35.3* 30.2*  MCV 104.8* 103.9* 104.1* 104.5*  PLT 146* 134* 141* 128*   Cardiac Enzymes: No results for input(s): "CKTOTAL", "CKMB", "CKMBINDEX", "TROPONINI" in the last 168 hours. BNP: Invalid input(s): "POCBNP" CBG: No results for input(s): "GLUCAP" in the last 168 hours. D-Dimer No results for input(s): "DDIMER" in the last 72 hours. Hgb A1c No results for input(s): "HGBA1C" in the last 72 hours. Lipid  Profile No results for input(s): "CHOL", "HDL", "LDLCALC", "TRIG", "CHOLHDL", "LDLDIRECT" in the last 72 hours. Thyroid function studies No results for input(s): "TSH", "T4TOTAL", "T3FREE", "THYROIDAB" in the last 72 hours.  Invalid input(s): "FREET3" Anemia work up No results for input(s): "VITAMINB12", "FOLATE", "FERRITIN", "TIBC", "IRON", "RETICCTPCT" in the last 72 hours. Urinalysis    Component Value Date/Time   COLORURINE YELLOW 07/27/2021 [place] 07/27/2021 1653   APPEARANCEUR Clear 07/16/2021 1132   LABSPEC 1.015 07/27/2021 1653   PHURINE 5.0 07/27/2021 1653   GLUCOSEU NEGATIVE 07/27/2021 1653   HGBUR NEGATIVE 07/27/2021 1653   BILIRUBINUR NEGATIVE 07/27/2021 1653   BILIRUBINUR Negative 07/16/2021 [place] 07/27/2021 1653   PROTEINUR NEGATIVE  07/27/2021 1653   NITRITE NEGATIVE 07/27/2021 1653   LEUKOCYTESUR NEGATIVE 07/27/2021 1653   Sepsis Labs Recent Labs  Lab 07/27/21 1757 07/28/21 0103 07/30/21 0530 07/31/21 0532  WBC 5.6 4.5 7.0 8.5   Microbiology Recent Results (from the past 240 hour(s))  Urine Culture     Status: None   Collection Time: 07/27/21  4:53 PM   Specimen: Urine, Clean Catch  Result Value Ref Range Status   Specimen Description   Final    URINE, CLEAN CATCH Performed at [facility], [address], [place], [place] [address]    Special Requests   Final    NONE Performed at [facility], [address], [place], [place] [address]    Culture   Final    NO GROWTH Performed at [facility], [place] [address] [place], [place] [address]    Report Status 07/29/2021 FINAL  Final  Culture, blood (Routine X 2) w Reflex to ID Panel     Status: None (Preliminary result)   Collection Time: 07/28/21  1:02 AM   Specimen: BLOOD RIGHT ARM  Result Value Ref Range Status   Specimen Description BLOOD RIGHT ARM  Final   Special Requests   Final    BOTTLES DRAWN AEROBIC ONLY Blood Culture results may not be optimal  due to an inadequate volume of blood received in culture bottles   Culture   Final    NO GROWTH 3 DAYS Performed at [facility], [address] [place], [place] [address]    Report Status PENDING  Incomplete  Culture, blood (Routine X 2) w Reflex to ID Panel     Status: None (Preliminary result)   Collection Time: 07/28/21  1:20 AM   Specimen: BLOOD RIGHT HAND  Result Value Ref Range Status   Specimen Description BLOOD RIGHT HAND  Final   Special Requests   Final    BOTTLES DRAWN AEROBIC ONLY Blood Culture results may not be optimal due to an excessive volume of blood received in culture bottles   Culture   Final    NO GROWTH 3 DAYS Performed at [facility], [address] [place], [place] [address]    Report Status PENDING  Incomplete  MRSA Next Gen by PCR, Nasal     Status: None   Collection Time: 07/29/21  9:25 AM   Specimen: Nasal Mucosa; Nasal Swab  Result Value Ref Range Status   MRSA by PCR Next Gen NOT DETECTED NOT DETECTED Final    Comment: (NOTE) The GeneXpert MRSA Assay (FDA approved for NASAL specimens only), is one component of a comprehensive MRSA colonization surveillance program. It is not intended to diagnose MRSA infection nor to guide or monitor treatment for MRSA infections. Test performance is not FDA approved in patients less than 75 years old. Performed at [facility], [address] [place], [place] [address]      Time coordinating discharge: 35 minutes  SIGNED:   Rodena Goldmann, DO [facility] 07/31/2021, 9:35 AM  If 7PM-7AM, please contact night-coverage [email or web address]

## 2021-08-01 DIAGNOSIS — E785 Hyperlipidemia, unspecified: Secondary | ICD-10-CM | POA: Diagnosis not present

## 2021-08-01 DIAGNOSIS — N390 Urinary tract infection, site not specified: Secondary | ICD-10-CM | POA: Diagnosis not present

## 2021-08-01 DIAGNOSIS — M6281 Muscle weakness (generalized): Secondary | ICD-10-CM | POA: Diagnosis not present

## 2021-08-01 DIAGNOSIS — I10 Essential (primary) hypertension: Secondary | ICD-10-CM | POA: Diagnosis not present

## 2021-08-01 DIAGNOSIS — H409 Unspecified glaucoma: Secondary | ICD-10-CM | POA: Diagnosis not present

## 2021-08-01 DIAGNOSIS — G9341 Metabolic encephalopathy: Secondary | ICD-10-CM | POA: Diagnosis not present

## 2021-08-02 LAB — CULTURE, BLOOD (ROUTINE X 2)
Culture: NO GROWTH
Culture: NO GROWTH

## 2021-08-05 DIAGNOSIS — M6281 Muscle weakness (generalized): Secondary | ICD-10-CM | POA: Diagnosis not present

## 2021-08-05 DIAGNOSIS — I10 Essential (primary) hypertension: Secondary | ICD-10-CM | POA: Diagnosis not present

## 2021-08-05 DIAGNOSIS — G9341 Metabolic encephalopathy: Secondary | ICD-10-CM | POA: Diagnosis not present

## 2021-08-05 DIAGNOSIS — N390 Urinary tract infection, site not specified: Secondary | ICD-10-CM | POA: Diagnosis not present

## 2021-08-05 DIAGNOSIS — E785 Hyperlipidemia, unspecified: Secondary | ICD-10-CM | POA: Diagnosis not present

## 2021-08-05 DIAGNOSIS — H409 Unspecified glaucoma: Secondary | ICD-10-CM | POA: Diagnosis not present

## 2021-08-09 DIAGNOSIS — N390 Urinary tract infection, site not specified: Secondary | ICD-10-CM | POA: Diagnosis not present

## 2021-08-12 DIAGNOSIS — E119 Type 2 diabetes mellitus without complications: Secondary | ICD-10-CM | POA: Diagnosis not present

## 2021-08-12 DIAGNOSIS — I10 Essential (primary) hypertension: Secondary | ICD-10-CM | POA: Diagnosis not present

## 2021-08-16 DIAGNOSIS — E785 Hyperlipidemia, unspecified: Secondary | ICD-10-CM | POA: Diagnosis not present

## 2021-08-16 DIAGNOSIS — I10 Essential (primary) hypertension: Secondary | ICD-10-CM | POA: Diagnosis not present

## 2021-08-16 DIAGNOSIS — N390 Urinary tract infection, site not specified: Secondary | ICD-10-CM | POA: Diagnosis not present

## 2021-08-16 DIAGNOSIS — G9341 Metabolic encephalopathy: Secondary | ICD-10-CM | POA: Diagnosis not present

## 2021-08-16 DIAGNOSIS — H409 Unspecified glaucoma: Secondary | ICD-10-CM | POA: Diagnosis not present

## 2021-08-16 DIAGNOSIS — M6281 Muscle weakness (generalized): Secondary | ICD-10-CM | POA: Diagnosis not present

## 2021-08-20 DIAGNOSIS — N179 Acute kidney failure, unspecified: Secondary | ICD-10-CM | POA: Diagnosis not present

## 2021-08-20 DIAGNOSIS — E119 Type 2 diabetes mellitus without complications: Secondary | ICD-10-CM | POA: Diagnosis not present

## 2021-08-20 DIAGNOSIS — I252 Old myocardial infarction: Secondary | ICD-10-CM | POA: Diagnosis not present

## 2021-08-20 DIAGNOSIS — Z87891 Personal history of nicotine dependence: Secondary | ICD-10-CM | POA: Diagnosis not present

## 2021-08-20 DIAGNOSIS — I2510 Atherosclerotic heart disease of native coronary artery without angina pectoris: Secondary | ICD-10-CM | POA: Diagnosis not present

## 2021-08-20 DIAGNOSIS — I10 Essential (primary) hypertension: Secondary | ICD-10-CM | POA: Diagnosis not present

## 2021-08-20 DIAGNOSIS — L89152 Pressure ulcer of sacral region, stage 2: Secondary | ICD-10-CM | POA: Diagnosis not present

## 2021-08-20 DIAGNOSIS — N390 Urinary tract infection, site not specified: Secondary | ICD-10-CM | POA: Diagnosis not present

## 2021-08-20 DIAGNOSIS — H409 Unspecified glaucoma: Secondary | ICD-10-CM | POA: Diagnosis not present

## 2021-08-20 DIAGNOSIS — G9341 Metabolic encephalopathy: Secondary | ICD-10-CM | POA: Diagnosis not present

## 2021-08-20 DIAGNOSIS — Z951 Presence of aortocoronary bypass graft: Secondary | ICD-10-CM | POA: Diagnosis not present

## 2021-08-20 DIAGNOSIS — E785 Hyperlipidemia, unspecified: Secondary | ICD-10-CM | POA: Diagnosis not present

## 2021-08-24 DIAGNOSIS — Z9181 History of falling: Secondary | ICD-10-CM | POA: Diagnosis not present

## 2021-08-24 DIAGNOSIS — M6281 Muscle weakness (generalized): Secondary | ICD-10-CM | POA: Diagnosis not present

## 2021-08-24 DIAGNOSIS — R262 Difficulty in walking, not elsewhere classified: Secondary | ICD-10-CM | POA: Diagnosis not present

## 2021-08-27 ENCOUNTER — Telehealth: Payer: Self-pay | Admitting: Family Medicine

## 2021-08-27 DIAGNOSIS — E785 Hyperlipidemia, unspecified: Secondary | ICD-10-CM | POA: Diagnosis not present

## 2021-08-27 DIAGNOSIS — H409 Unspecified glaucoma: Secondary | ICD-10-CM | POA: Diagnosis not present

## 2021-08-27 DIAGNOSIS — N390 Urinary tract infection, site not specified: Secondary | ICD-10-CM | POA: Diagnosis not present

## 2021-08-27 DIAGNOSIS — L89152 Pressure ulcer of sacral region, stage 2: Secondary | ICD-10-CM | POA: Diagnosis not present

## 2021-08-27 DIAGNOSIS — I252 Old myocardial infarction: Secondary | ICD-10-CM | POA: Diagnosis not present

## 2021-08-27 DIAGNOSIS — N179 Acute kidney failure, unspecified: Secondary | ICD-10-CM | POA: Diagnosis not present

## 2021-08-27 DIAGNOSIS — Z951 Presence of aortocoronary bypass graft: Secondary | ICD-10-CM | POA: Diagnosis not present

## 2021-08-27 DIAGNOSIS — G9341 Metabolic encephalopathy: Secondary | ICD-10-CM | POA: Diagnosis not present

## 2021-08-27 DIAGNOSIS — I2510 Atherosclerotic heart disease of native coronary artery without angina pectoris: Secondary | ICD-10-CM | POA: Diagnosis not present

## 2021-08-27 DIAGNOSIS — Z87891 Personal history of nicotine dependence: Secondary | ICD-10-CM | POA: Diagnosis not present

## 2021-08-27 DIAGNOSIS — E119 Type 2 diabetes mellitus without complications: Secondary | ICD-10-CM | POA: Diagnosis not present

## 2021-08-27 DIAGNOSIS — I10 Essential (primary) hypertension: Secondary | ICD-10-CM | POA: Diagnosis not present

## 2021-08-27 NOTE — Telephone Encounter (Signed)
Malori from [facility] requesting verbal orders for Occupational Therapy once a  week for 7 weeks. Please advise. Thank you [phone]

## 2021-08-28 ENCOUNTER — Telehealth: Payer: Self-pay

## 2021-08-28 NOTE — Telephone Encounter (Signed)
Terrance Miller returned call and verbalized understanding.

## 2021-08-28 NOTE — Telephone Encounter (Signed)
Ronda Fairly, OT called requesting verbal orders for Mr. Hammen for 1 week 1, 2 week 4 and 4 week 4 for OT. Thank you!

## 2021-08-28 NOTE — Telephone Encounter (Signed)
Left message to return call 

## 2021-08-29 NOTE — Telephone Encounter (Signed)
Chriss Driver, LPN     Verbal orders given to Ronda Fairly, OT. Mjp,lpn

## 2021-09-03 DIAGNOSIS — Z87891 Personal history of nicotine dependence: Secondary | ICD-10-CM | POA: Diagnosis not present

## 2021-09-03 DIAGNOSIS — L89152 Pressure ulcer of sacral region, stage 2: Secondary | ICD-10-CM | POA: Diagnosis not present

## 2021-09-03 DIAGNOSIS — I2510 Atherosclerotic heart disease of native coronary artery without angina pectoris: Secondary | ICD-10-CM | POA: Diagnosis not present

## 2021-09-03 DIAGNOSIS — N179 Acute kidney failure, unspecified: Secondary | ICD-10-CM | POA: Diagnosis not present

## 2021-09-03 DIAGNOSIS — I252 Old myocardial infarction: Secondary | ICD-10-CM | POA: Diagnosis not present

## 2021-09-03 DIAGNOSIS — N390 Urinary tract infection, site not specified: Secondary | ICD-10-CM | POA: Diagnosis not present

## 2021-09-03 DIAGNOSIS — E785 Hyperlipidemia, unspecified: Secondary | ICD-10-CM | POA: Diagnosis not present

## 2021-09-03 DIAGNOSIS — Z951 Presence of aortocoronary bypass graft: Secondary | ICD-10-CM | POA: Diagnosis not present

## 2021-09-03 DIAGNOSIS — I10 Essential (primary) hypertension: Secondary | ICD-10-CM | POA: Diagnosis not present

## 2021-09-03 DIAGNOSIS — E119 Type 2 diabetes mellitus without complications: Secondary | ICD-10-CM | POA: Diagnosis not present

## 2021-09-03 DIAGNOSIS — G9341 Metabolic encephalopathy: Secondary | ICD-10-CM | POA: Diagnosis not present

## 2021-09-03 DIAGNOSIS — H409 Unspecified glaucoma: Secondary | ICD-10-CM | POA: Diagnosis not present

## 2021-09-10 ENCOUNTER — Telehealth: Payer: Self-pay | Admitting: *Deleted

## 2021-09-10 DIAGNOSIS — E119 Type 2 diabetes mellitus without complications: Secondary | ICD-10-CM | POA: Diagnosis not present

## 2021-09-10 DIAGNOSIS — I252 Old myocardial infarction: Secondary | ICD-10-CM | POA: Diagnosis not present

## 2021-09-10 DIAGNOSIS — I2510 Atherosclerotic heart disease of native coronary artery without angina pectoris: Secondary | ICD-10-CM | POA: Diagnosis not present

## 2021-09-10 DIAGNOSIS — Z87891 Personal history of nicotine dependence: Secondary | ICD-10-CM | POA: Diagnosis not present

## 2021-09-10 DIAGNOSIS — Z951 Presence of aortocoronary bypass graft: Secondary | ICD-10-CM | POA: Diagnosis not present

## 2021-09-10 DIAGNOSIS — L89152 Pressure ulcer of sacral region, stage 2: Secondary | ICD-10-CM | POA: Diagnosis not present

## 2021-09-10 DIAGNOSIS — H409 Unspecified glaucoma: Secondary | ICD-10-CM | POA: Diagnosis not present

## 2021-09-10 DIAGNOSIS — N179 Acute kidney failure, unspecified: Secondary | ICD-10-CM | POA: Diagnosis not present

## 2021-09-10 DIAGNOSIS — N390 Urinary tract infection, site not specified: Secondary | ICD-10-CM | POA: Diagnosis not present

## 2021-09-10 DIAGNOSIS — G9341 Metabolic encephalopathy: Secondary | ICD-10-CM | POA: Diagnosis not present

## 2021-09-10 DIAGNOSIS — E785 Hyperlipidemia, unspecified: Secondary | ICD-10-CM | POA: Diagnosis not present

## 2021-09-10 DIAGNOSIS — I10 Essential (primary) hypertension: Secondary | ICD-10-CM | POA: Diagnosis not present

## 2021-09-10 NOTE — Telephone Encounter (Signed)
Requesting a script for transfer bench to [place]- will need dx

## 2021-09-12 ENCOUNTER — Telehealth: Payer: Self-pay | Admitting: *Deleted

## 2021-09-12 DIAGNOSIS — I2510 Atherosclerotic heart disease of native coronary artery without angina pectoris: Secondary | ICD-10-CM | POA: Diagnosis not present

## 2021-09-12 DIAGNOSIS — N390 Urinary tract infection, site not specified: Secondary | ICD-10-CM | POA: Diagnosis not present

## 2021-09-12 DIAGNOSIS — G9341 Metabolic encephalopathy: Secondary | ICD-10-CM | POA: Diagnosis not present

## 2021-09-12 DIAGNOSIS — E119 Type 2 diabetes mellitus without complications: Secondary | ICD-10-CM | POA: Diagnosis not present

## 2021-09-12 DIAGNOSIS — N179 Acute kidney failure, unspecified: Secondary | ICD-10-CM | POA: Diagnosis not present

## 2021-09-12 DIAGNOSIS — L89152 Pressure ulcer of sacral region, stage 2: Secondary | ICD-10-CM | POA: Diagnosis not present

## 2021-09-12 DIAGNOSIS — H409 Unspecified glaucoma: Secondary | ICD-10-CM | POA: Diagnosis not present

## 2021-09-12 DIAGNOSIS — I10 Essential (primary) hypertension: Secondary | ICD-10-CM | POA: Diagnosis not present

## 2021-09-12 DIAGNOSIS — Z87891 Personal history of nicotine dependence: Secondary | ICD-10-CM | POA: Diagnosis not present

## 2021-09-12 DIAGNOSIS — Z951 Presence of aortocoronary bypass graft: Secondary | ICD-10-CM | POA: Diagnosis not present

## 2021-09-12 DIAGNOSIS — I252 Old myocardial infarction: Secondary | ICD-10-CM | POA: Diagnosis not present

## 2021-09-12 DIAGNOSIS — E785 Hyperlipidemia, unspecified: Secondary | ICD-10-CM | POA: Diagnosis not present

## 2021-09-12 NOTE — Telephone Encounter (Signed)
Patient notified and verbalized understanding. 

## 2021-09-12 NOTE — Telephone Encounter (Signed)
Terrance Miller with PT at [place] called and stated the patient's BP before therapy was 110/75 and patient had not taken BP meds- please advise

## 2021-09-12 NOTE — Telephone Encounter (Signed)
Cook, [place] G, DO     Hold amlodipine.

## 2021-09-13 ENCOUNTER — Inpatient Hospital Stay: Payer: Medicare Other | Attending: Cardiology | Admitting: Cardiology

## 2021-09-13 ENCOUNTER — Encounter: Payer: Self-pay | Admitting: Cardiology

## 2021-09-13 VITALS — BP 138/68 | HR 64 | Ht 66.0 in | Wt 216.0 lb

## 2021-09-13 DIAGNOSIS — I10 Essential (primary) hypertension: Secondary | ICD-10-CM

## 2021-09-13 DIAGNOSIS — N1832 Chronic kidney disease, stage 3b: Secondary | ICD-10-CM | POA: Diagnosis not present

## 2021-09-13 DIAGNOSIS — I2510 Atherosclerotic heart disease of native coronary artery without angina pectoris: Secondary | ICD-10-CM

## 2021-09-13 DIAGNOSIS — Z01818 Encounter for other preprocedural examination: Secondary | ICD-10-CM

## 2021-09-13 DIAGNOSIS — E785 Hyperlipidemia, unspecified: Secondary | ICD-10-CM | POA: Diagnosis not present

## 2021-09-13 MED ORDER — ASPIRIN 81 MG PO TBEC: 81.0000 mg | DELAYED_RELEASE_TABLET | Freq: Every day | ORAL | 3 refills | Status: DC

## 2021-09-13 MED ORDER — CARVEDILOL 6.25 MG PO TABS: 6.2500 mg | ORAL_TABLET | Freq: Two times a day (BID) | ORAL | 3 refills | Status: DC
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
## 2021-09-13 NOTE — Patient Instructions (Signed)
Medication Instructions:   STOP Amlodipine  STOP Metoprolol  START Coreg 6.25 mg twice a day  START Aspirin 81 mg daily     Labwork: CBC BMET Lipids  Testing/Procedures: Your physician has requested that you have an echocardiogram. Echocardiography is a painless test that uses sound waves to create images of your heart. It provides your doctor with information about the size and shape of your heart and how well your heart's chambers and valves are working. This procedure takes approximately one hour. There are no restrictions for this procedure.   Follow-Up: 6 months  Any Other Special Instructions Will Be Listed Below (If Applicable).  If you need a refill on your cardiac medications before your next appointment, please call your pharmacy.

## 2021-09-17 DIAGNOSIS — E785 Hyperlipidemia, unspecified: Secondary | ICD-10-CM | POA: Diagnosis not present

## 2021-09-17 DIAGNOSIS — N390 Urinary tract infection, site not specified: Secondary | ICD-10-CM | POA: Diagnosis not present

## 2021-09-17 DIAGNOSIS — H409 Unspecified glaucoma: Secondary | ICD-10-CM | POA: Diagnosis not present

## 2021-09-17 DIAGNOSIS — Z87891 Personal history of nicotine dependence: Secondary | ICD-10-CM | POA: Diagnosis not present

## 2021-09-17 DIAGNOSIS — I10 Essential (primary) hypertension: Secondary | ICD-10-CM | POA: Diagnosis not present

## 2021-09-17 DIAGNOSIS — G9341 Metabolic encephalopathy: Secondary | ICD-10-CM | POA: Diagnosis not present

## 2021-09-17 DIAGNOSIS — Z951 Presence of aortocoronary bypass graft: Secondary | ICD-10-CM | POA: Diagnosis not present

## 2021-09-17 DIAGNOSIS — I2510 Atherosclerotic heart disease of native coronary artery without angina pectoris: Secondary | ICD-10-CM | POA: Diagnosis not present

## 2021-09-17 DIAGNOSIS — L89152 Pressure ulcer of sacral region, stage 2: Secondary | ICD-10-CM | POA: Diagnosis not present

## 2021-09-17 DIAGNOSIS — N179 Acute kidney failure, unspecified: Secondary | ICD-10-CM | POA: Diagnosis not present

## 2021-09-17 DIAGNOSIS — E119 Type 2 diabetes mellitus without complications: Secondary | ICD-10-CM | POA: Diagnosis not present

## 2021-09-17 DIAGNOSIS — I252 Old myocardial infarction: Secondary | ICD-10-CM | POA: Diagnosis not present

## 2021-09-19 ENCOUNTER — Ambulatory Visit (HOSPITAL_COMMUNITY)
Admission: RE | Admit: 2021-09-19 | Discharge: 2021-09-19 | Disposition: A | Discharge status: Home / Self Care | Payer: Medicare Other | Source: Ambulatory Visit | Attending: Cardiology | Admitting: Cardiology

## 2021-09-19 DIAGNOSIS — I2510 Atherosclerotic heart disease of native coronary artery without angina pectoris: Secondary | ICD-10-CM

## 2021-09-19 DIAGNOSIS — Z01810 Encounter for preprocedural cardiovascular examination: Secondary | ICD-10-CM

## 2021-09-19 DIAGNOSIS — I361 Nonrheumatic tricuspid (valve) insufficiency: Secondary | ICD-10-CM

## 2021-09-19 DIAGNOSIS — Z01818 Encounter for other preprocedural examination: Secondary | ICD-10-CM | POA: Diagnosis not present

## 2021-09-19 DIAGNOSIS — I351 Nonrheumatic aortic (valve) insufficiency: Secondary | ICD-10-CM

## 2021-09-19 LAB — ECHOCARDIOGRAM COMPLETE
Area-P 1/2: 5.13 cm2
P 1/2 time: 912 msec
S' Lateral: 3.4 cm

## 2021-09-19 NOTE — Progress Notes (Signed)
*  PRELIMINARY RESULTS* Echocardiogram 2D Echocardiogram has been performed.  Terrance Miller 09/19/2021, 10:12 AM

## 2021-09-20 DIAGNOSIS — N179 Acute kidney failure, unspecified: Secondary | ICD-10-CM | POA: Diagnosis not present

## 2021-09-20 DIAGNOSIS — Z951 Presence of aortocoronary bypass graft: Secondary | ICD-10-CM | POA: Diagnosis not present

## 2021-09-20 DIAGNOSIS — E785 Hyperlipidemia, unspecified: Secondary | ICD-10-CM | POA: Diagnosis not present

## 2021-09-20 DIAGNOSIS — H409 Unspecified glaucoma: Secondary | ICD-10-CM | POA: Diagnosis not present

## 2021-09-20 DIAGNOSIS — I252 Old myocardial infarction: Secondary | ICD-10-CM | POA: Diagnosis not present

## 2021-09-20 DIAGNOSIS — I2510 Atherosclerotic heart disease of native coronary artery without angina pectoris: Secondary | ICD-10-CM | POA: Diagnosis not present

## 2021-09-20 DIAGNOSIS — I10 Essential (primary) hypertension: Secondary | ICD-10-CM | POA: Diagnosis not present

## 2021-09-20 DIAGNOSIS — G9341 Metabolic encephalopathy: Secondary | ICD-10-CM | POA: Diagnosis not present

## 2021-09-20 DIAGNOSIS — Z87891 Personal history of nicotine dependence: Secondary | ICD-10-CM | POA: Diagnosis not present

## 2021-09-20 DIAGNOSIS — L89152 Pressure ulcer of sacral region, stage 2: Secondary | ICD-10-CM | POA: Diagnosis not present

## 2021-09-20 DIAGNOSIS — N390 Urinary tract infection, site not specified: Secondary | ICD-10-CM | POA: Diagnosis not present

## 2021-09-20 DIAGNOSIS — E119 Type 2 diabetes mellitus without complications: Secondary | ICD-10-CM | POA: Diagnosis not present

## 2021-09-24 DIAGNOSIS — Z9181 History of falling: Secondary | ICD-10-CM | POA: Diagnosis not present

## 2021-09-24 DIAGNOSIS — R262 Difficulty in walking, not elsewhere classified: Secondary | ICD-10-CM | POA: Diagnosis not present

## 2021-09-24 DIAGNOSIS — M6281 Muscle weakness (generalized): Secondary | ICD-10-CM | POA: Diagnosis not present

## 2021-09-25 ENCOUNTER — Telehealth: Payer: Self-pay

## 2021-09-25 NOTE — Telephone Encounter (Signed)
Terrance Miller left a voice messaged 09-25-21.   Letting Dr. Felipa Eth know   Patient had the following done: Heart Check Physical  Wants to move forward with getting surgery set up.  Please call:  Terrance Miller [phone]  Thanks, Helene Kelp

## 2021-09-26 DIAGNOSIS — N179 Acute kidney failure, unspecified: Secondary | ICD-10-CM | POA: Diagnosis not present

## 2021-09-26 DIAGNOSIS — H409 Unspecified glaucoma: Secondary | ICD-10-CM | POA: Diagnosis not present

## 2021-09-26 DIAGNOSIS — E119 Type 2 diabetes mellitus without complications: Secondary | ICD-10-CM | POA: Diagnosis not present

## 2021-09-26 DIAGNOSIS — G9341 Metabolic encephalopathy: Secondary | ICD-10-CM | POA: Diagnosis not present

## 2021-09-26 DIAGNOSIS — I10 Essential (primary) hypertension: Secondary | ICD-10-CM | POA: Diagnosis not present

## 2021-09-26 DIAGNOSIS — I252 Old myocardial infarction: Secondary | ICD-10-CM | POA: Diagnosis not present

## 2021-09-26 DIAGNOSIS — E785 Hyperlipidemia, unspecified: Secondary | ICD-10-CM | POA: Diagnosis not present

## 2021-09-26 DIAGNOSIS — Z87891 Personal history of nicotine dependence: Secondary | ICD-10-CM | POA: Diagnosis not present

## 2021-09-26 DIAGNOSIS — I2510 Atherosclerotic heart disease of native coronary artery without angina pectoris: Secondary | ICD-10-CM | POA: Diagnosis not present

## 2021-09-26 DIAGNOSIS — N390 Urinary tract infection, site not specified: Secondary | ICD-10-CM | POA: Diagnosis not present

## 2021-09-26 DIAGNOSIS — L89152 Pressure ulcer of sacral region, stage 2: Secondary | ICD-10-CM | POA: Diagnosis not present

## 2021-09-26 DIAGNOSIS — Z951 Presence of aortocoronary bypass graft: Secondary | ICD-10-CM | POA: Diagnosis not present

## 2021-09-26 NOTE — Telephone Encounter (Signed)
Apt scheduled for 10/11 at 230p, pt and daughter aware and apt reminder mailed.

## 2021-09-27 DIAGNOSIS — H409 Unspecified glaucoma: Secondary | ICD-10-CM | POA: Diagnosis not present

## 2021-09-27 DIAGNOSIS — E119 Type 2 diabetes mellitus without complications: Secondary | ICD-10-CM | POA: Diagnosis not present

## 2021-09-27 DIAGNOSIS — N179 Acute kidney failure, unspecified: Secondary | ICD-10-CM | POA: Diagnosis not present

## 2021-09-27 DIAGNOSIS — I252 Old myocardial infarction: Secondary | ICD-10-CM | POA: Diagnosis not present

## 2021-09-27 DIAGNOSIS — N390 Urinary tract infection, site not specified: Secondary | ICD-10-CM | POA: Diagnosis not present

## 2021-09-27 DIAGNOSIS — L89152 Pressure ulcer of sacral region, stage 2: Secondary | ICD-10-CM | POA: Diagnosis not present

## 2021-09-27 DIAGNOSIS — I2510 Atherosclerotic heart disease of native coronary artery without angina pectoris: Secondary | ICD-10-CM | POA: Diagnosis not present

## 2021-09-27 DIAGNOSIS — G9341 Metabolic encephalopathy: Secondary | ICD-10-CM | POA: Diagnosis not present

## 2021-09-27 DIAGNOSIS — I10 Essential (primary) hypertension: Secondary | ICD-10-CM | POA: Diagnosis not present

## 2021-09-27 DIAGNOSIS — E785 Hyperlipidemia, unspecified: Secondary | ICD-10-CM | POA: Diagnosis not present

## 2021-09-27 DIAGNOSIS — Z87891 Personal history of nicotine dependence: Secondary | ICD-10-CM | POA: Diagnosis not present

## 2021-09-27 DIAGNOSIS — Z951 Presence of aortocoronary bypass graft: Secondary | ICD-10-CM | POA: Diagnosis not present

## 2021-10-04 DIAGNOSIS — I252 Old myocardial infarction: Secondary | ICD-10-CM | POA: Diagnosis not present

## 2021-10-04 DIAGNOSIS — E785 Hyperlipidemia, unspecified: Secondary | ICD-10-CM | POA: Diagnosis not present

## 2021-10-04 DIAGNOSIS — N390 Urinary tract infection, site not specified: Secondary | ICD-10-CM | POA: Diagnosis not present

## 2021-10-04 DIAGNOSIS — E119 Type 2 diabetes mellitus without complications: Secondary | ICD-10-CM | POA: Diagnosis not present

## 2021-10-04 DIAGNOSIS — N179 Acute kidney failure, unspecified: Secondary | ICD-10-CM | POA: Diagnosis not present

## 2021-10-04 DIAGNOSIS — I10 Essential (primary) hypertension: Secondary | ICD-10-CM | POA: Diagnosis not present

## 2021-10-04 DIAGNOSIS — I2510 Atherosclerotic heart disease of native coronary artery without angina pectoris: Secondary | ICD-10-CM | POA: Diagnosis not present

## 2021-10-04 DIAGNOSIS — Z951 Presence of aortocoronary bypass graft: Secondary | ICD-10-CM | POA: Diagnosis not present

## 2021-10-04 DIAGNOSIS — Z87891 Personal history of nicotine dependence: Secondary | ICD-10-CM | POA: Diagnosis not present

## 2021-10-04 DIAGNOSIS — H409 Unspecified glaucoma: Secondary | ICD-10-CM | POA: Diagnosis not present

## 2021-10-04 DIAGNOSIS — L89152 Pressure ulcer of sacral region, stage 2: Secondary | ICD-10-CM | POA: Diagnosis not present

## 2021-10-04 DIAGNOSIS — G9341 Metabolic encephalopathy: Secondary | ICD-10-CM | POA: Diagnosis not present

## 2021-10-08 DIAGNOSIS — I2510 Atherosclerotic heart disease of native coronary artery without angina pectoris: Secondary | ICD-10-CM | POA: Diagnosis not present

## 2021-10-08 DIAGNOSIS — I10 Essential (primary) hypertension: Secondary | ICD-10-CM | POA: Diagnosis not present

## 2021-10-08 DIAGNOSIS — G9341 Metabolic encephalopathy: Secondary | ICD-10-CM | POA: Diagnosis not present

## 2021-10-08 DIAGNOSIS — L89152 Pressure ulcer of sacral region, stage 2: Secondary | ICD-10-CM | POA: Diagnosis not present

## 2021-10-08 DIAGNOSIS — N179 Acute kidney failure, unspecified: Secondary | ICD-10-CM | POA: Diagnosis not present

## 2021-10-08 DIAGNOSIS — E785 Hyperlipidemia, unspecified: Secondary | ICD-10-CM | POA: Diagnosis not present

## 2021-10-08 DIAGNOSIS — H409 Unspecified glaucoma: Secondary | ICD-10-CM | POA: Diagnosis not present

## 2021-10-08 DIAGNOSIS — Z951 Presence of aortocoronary bypass graft: Secondary | ICD-10-CM | POA: Diagnosis not present

## 2021-10-08 DIAGNOSIS — E119 Type 2 diabetes mellitus without complications: Secondary | ICD-10-CM | POA: Diagnosis not present

## 2021-10-08 DIAGNOSIS — I252 Old myocardial infarction: Secondary | ICD-10-CM | POA: Diagnosis not present

## 2021-10-08 DIAGNOSIS — Z87891 Personal history of nicotine dependence: Secondary | ICD-10-CM | POA: Diagnosis not present

## 2021-10-08 DIAGNOSIS — N390 Urinary tract infection, site not specified: Secondary | ICD-10-CM | POA: Diagnosis not present

## 2021-10-10 DIAGNOSIS — I2510 Atherosclerotic heart disease of native coronary artery without angina pectoris: Secondary | ICD-10-CM | POA: Diagnosis not present

## 2021-10-10 DIAGNOSIS — N179 Acute kidney failure, unspecified: Secondary | ICD-10-CM | POA: Diagnosis not present

## 2021-10-10 DIAGNOSIS — E119 Type 2 diabetes mellitus without complications: Secondary | ICD-10-CM | POA: Diagnosis not present

## 2021-10-10 DIAGNOSIS — E785 Hyperlipidemia, unspecified: Secondary | ICD-10-CM | POA: Diagnosis not present

## 2021-10-10 DIAGNOSIS — I10 Essential (primary) hypertension: Secondary | ICD-10-CM | POA: Diagnosis not present

## 2021-10-10 DIAGNOSIS — H409 Unspecified glaucoma: Secondary | ICD-10-CM | POA: Diagnosis not present

## 2021-10-10 DIAGNOSIS — G9341 Metabolic encephalopathy: Secondary | ICD-10-CM | POA: Diagnosis not present

## 2021-10-10 DIAGNOSIS — I252 Old myocardial infarction: Secondary | ICD-10-CM | POA: Diagnosis not present

## 2021-10-10 DIAGNOSIS — Z87891 Personal history of nicotine dependence: Secondary | ICD-10-CM | POA: Diagnosis not present

## 2021-10-10 DIAGNOSIS — L89152 Pressure ulcer of sacral region, stage 2: Secondary | ICD-10-CM | POA: Diagnosis not present

## 2021-10-10 DIAGNOSIS — Z951 Presence of aortocoronary bypass graft: Secondary | ICD-10-CM | POA: Diagnosis not present

## 2021-10-10 DIAGNOSIS — N390 Urinary tract infection, site not specified: Secondary | ICD-10-CM | POA: Diagnosis not present

## 2021-10-14 DIAGNOSIS — Z951 Presence of aortocoronary bypass graft: Secondary | ICD-10-CM | POA: Diagnosis not present

## 2021-10-14 DIAGNOSIS — N179 Acute kidney failure, unspecified: Secondary | ICD-10-CM | POA: Diagnosis not present

## 2021-10-14 DIAGNOSIS — I2510 Atherosclerotic heart disease of native coronary artery without angina pectoris: Secondary | ICD-10-CM | POA: Diagnosis not present

## 2021-10-14 DIAGNOSIS — I10 Essential (primary) hypertension: Secondary | ICD-10-CM | POA: Diagnosis not present

## 2021-10-14 DIAGNOSIS — I252 Old myocardial infarction: Secondary | ICD-10-CM | POA: Diagnosis not present

## 2021-10-14 DIAGNOSIS — E785 Hyperlipidemia, unspecified: Secondary | ICD-10-CM | POA: Diagnosis not present

## 2021-10-14 DIAGNOSIS — G9341 Metabolic encephalopathy: Secondary | ICD-10-CM | POA: Diagnosis not present

## 2021-10-14 DIAGNOSIS — Z87891 Personal history of nicotine dependence: Secondary | ICD-10-CM | POA: Diagnosis not present

## 2021-10-14 DIAGNOSIS — N390 Urinary tract infection, site not specified: Secondary | ICD-10-CM | POA: Diagnosis not present

## 2021-10-14 DIAGNOSIS — L89152 Pressure ulcer of sacral region, stage 2: Secondary | ICD-10-CM | POA: Diagnosis not present

## 2021-10-14 DIAGNOSIS — H409 Unspecified glaucoma: Secondary | ICD-10-CM | POA: Diagnosis not present

## 2021-10-14 DIAGNOSIS — E119 Type 2 diabetes mellitus without complications: Secondary | ICD-10-CM | POA: Diagnosis not present

## 2021-10-16 ENCOUNTER — Ambulatory Visit (INDEPENDENT_AMBULATORY_CARE_PROVIDER_SITE_OTHER): Payer: Medicare Other | Admitting: Urology

## 2021-10-16 ENCOUNTER — Encounter: Payer: Self-pay | Admitting: Urology

## 2021-10-16 VITALS — BP 192/89 | HR 57 | Ht 66.0 in | Wt 216.0 lb

## 2021-10-16 DIAGNOSIS — E785 Hyperlipidemia, unspecified: Secondary | ICD-10-CM | POA: Diagnosis not present

## 2021-10-16 DIAGNOSIS — K4020 Bilateral inguinal hernia, without obstruction or gangrene, not specified as recurrent: Secondary | ICD-10-CM | POA: Diagnosis not present

## 2021-10-16 DIAGNOSIS — N433 Hydrocele, unspecified: Secondary | ICD-10-CM | POA: Diagnosis not present

## 2021-10-16 DIAGNOSIS — Z951 Presence of aortocoronary bypass graft: Secondary | ICD-10-CM | POA: Diagnosis not present

## 2021-10-16 DIAGNOSIS — N390 Urinary tract infection, site not specified: Secondary | ICD-10-CM | POA: Diagnosis not present

## 2021-10-16 DIAGNOSIS — H409 Unspecified glaucoma: Secondary | ICD-10-CM | POA: Diagnosis not present

## 2021-10-16 DIAGNOSIS — N179 Acute kidney failure, unspecified: Secondary | ICD-10-CM | POA: Diagnosis not present

## 2021-10-16 DIAGNOSIS — I252 Old myocardial infarction: Secondary | ICD-10-CM | POA: Diagnosis not present

## 2021-10-16 DIAGNOSIS — L89152 Pressure ulcer of sacral region, stage 2: Secondary | ICD-10-CM | POA: Diagnosis not present

## 2021-10-16 DIAGNOSIS — G9341 Metabolic encephalopathy: Secondary | ICD-10-CM | POA: Diagnosis not present

## 2021-10-16 DIAGNOSIS — Z87891 Personal history of nicotine dependence: Secondary | ICD-10-CM | POA: Diagnosis not present

## 2021-10-16 DIAGNOSIS — I10 Essential (primary) hypertension: Secondary | ICD-10-CM | POA: Diagnosis not present

## 2021-10-16 DIAGNOSIS — E119 Type 2 diabetes mellitus without complications: Secondary | ICD-10-CM | POA: Diagnosis not present

## 2021-10-16 DIAGNOSIS — I2510 Atherosclerotic heart disease of native coronary artery without angina pectoris: Secondary | ICD-10-CM | POA: Diagnosis not present

## 2021-10-16 NOTE — Progress Notes (Signed)
Assessment: 1. Hydrocele - bilateral   2. Non-recurrent bilateral inguinal hernia without obstruction or gangrene     Plan: I reviewed the scrotal ultrasound results and CT results from July 2023 obtained since his last visit with me. Diagnosis and management of a hydrocele again discussed with the patient and his daughter today.  Surgical management with hydrocelectomy discussed.  Risks of procedure reviewed in detail.  Specifically, we discussed the increased risk of anesthesia given his advanced age.  I also discussed the risk of infection, bleeding, injury to scrotal structures, possible recurrence of the hydrocele, and need for additional procedures. He is interested in proceeding with bilateral hydrocelectomies. Recommend Gen Surgery evaluation for bilateral inguinal hernias seen on CT imaging prior to surgical management  Procedure: The patient will be scheduled for bilateral hydrocelectomy at [facility].  Surgical request is placed with the surgery schedulers and will be scheduled at the patient's/family request. Informed consent is given as documented below. Anesthesia: General  The patient does not  have sleep apnea, history of MRSA, history of VRE, history of cardiac device requiring special anesthetic needs. Patient is stable and considered clear for surgical in an outpatient ambulatory surgery setting as well as patient hospital setting.  Consent for Operation or Procedure: Provider Certification I hereby certify that the nature, purpose, benefits, usual and most frequent risks of, and alternatives to, the operation or procedure have been explained to the patient (or person authorized to sign for the patient) either by me as responsible physician or by the provider who is to perform the operation or procedure. Time spent such that the patient/family has had an opportunity to ask questions, and that those questions have been answered. The patient or the patient's representative  has been advised that selected tasks may be performed by assistants to the primary health care provider(s). I believe that the patient (or person authorized to sign for the patient) understands what has been explained, and has consented to the operation or procedure. No guarantees were implied or made.  Chief Complaint:  Chief Complaint  Patient presents with   Hydrocele    History of Present Illness:  Terrance Miller is a 88 y.o. year old male who is seen for further evaluation of large bilateral hydroceles.  He has a history of a scrotal hydrocele for a number of years.  This was previously evaluated with an ultrasound in 4/19 which showed a large left hydrocele and trace right hydrocele with normal testes bilaterally.  He was recently seen in the emergency room for a apparent increase in the size of the hydrocele.  He reports some difficulty with urination due to the hydrocele.  He does have some mild discomfort associated with a hydrocele.  He reports nocturia and urge incontinence.  No dysuria or gross hematuria.  He is currently on tamsulosin. AUA score = 16. Scrotal ultrasound from 11/15/2020 shows large bilateral hydroceles with debris in the right hydrocele, compression of the testicles by the hydrocele with normal blood flow. He was seen in March 2023 at which time surgical management of his bilateral hydroceles was discussed.  Preoperative assessment by primary care and cardiology was recommended. He underwent evaluation with a scrotal ultrasound on 07/19/2021 due to increased pain.  The scrotal ultrasound showed normal testes bilaterally, large right hydrocele with debris and large left hydrocele. He was admitted to the hospital in late July 2023.  A repeat scrotal ultrasound at that time showed a large minimally complicated right sided hydrocele, unchanged from  November 2022, moderate-sized left hydrocele without significant change from November 2022. CT imaging from 07/27/2021 showed  moderate left and small right fat-containing inguinal hernias.   He returns today for follow-up.  He remains interested in proceeding with surgical management of his bilateral hydroceles.  He has not seen any significant change in the size of the bilateral hydroceles.  No recent pain.  He does have difficulty with urination due to the size of his scrotum and difficulty retracting the foreskin.  Portions of the above documentation were copied from a prior visit for review purposes only.   Past Medical History:  Past Medical History:  Diagnosis Date   Acute ST elevation myocardial infarction (STEMI) of inferior wall ([place]) 2004   Coronary artery disease    DES x2 to the RCA 2004, residual disease managed medically   Hyperlipidemia    Hypertension    Type 2 diabetes mellitus ([place])     Past Surgical History:  Past Surgical History:  Procedure Laterality Date   CORONARY ANGIOPLASTY WITH STENT PLACEMENT  09/05/2002   stent RCA, 80% first diagonal, 70-80% mid-diagonal, 80% mid LAD stenosis   NM MYOCAR PERF WALL MOTION  11/01/2008   Normal    Allergies:  No Known Allergies  Family History:  Family History  Problem Relation Age of Onset   Hypertension Father    Diabetes Sister     Social History:  Social History   Tobacco Use   Smoking status: Former    Types: Cigarettes   Smokeless tobacco: Never  Vaping Use   Vaping Use: Never used  Substance Use Topics   Alcohol use: Not Currently   Drug use: No    ROS: Constitutional:  Negative for fever, chills, weight loss CV: Negative for chest pain, previous MI, hypertension Respiratory:  Negative for shortness of breath, wheezing, sleep apnea, frequent cough GI:  Negative for nausea, vomiting, bloody stool, GERD  Physical exam: BP (!) 192/89   Pulse (!) 57   Ht '5\' 6"'$  (1.676 m)   Wt 216 lb (98 kg)   BMI 34.86 kg/m  GENERAL APPEARANCE:  Well appearing, well developed, well nourished, NAD HEENT:  Atraumatic, normocephalic,  oropharynx clear NECK:  Supple without lymphadenopathy or thyromegaly ABDOMEN:  Soft, non-tender, no masses EXTREMITIES:  Moves all extremities well, without clubbing, cyanosis; bilateral lower extremity edema  NEUROLOGIC:  Alert and oriented x 3, in wheelchair, CN II-XII grossly intact MENTAL STATUS:  appropriate BACK:  Non-tender to palpation, No CVAT SKIN:  Warm, dry, and intact GU: Penis: Uncircumcised, unable to retract foreskin due to weight of scrotum Scrotum: Large bilateral hydroceles, R>L Testis: Unable to palpate secondary to hydroceles     Results: None

## 2021-10-18 ENCOUNTER — Encounter: Payer: Self-pay | Admitting: *Deleted

## 2021-10-18 ENCOUNTER — Telehealth: Payer: Self-pay | Admitting: Cardiology

## 2021-10-18 NOTE — Telephone Encounter (Signed)
Spoke to daughter (EC)-aware and verbalized understanding. Results mailed.

## 2021-10-18 NOTE — Telephone Encounter (Signed)
Follow up:     Patient's daughter is returning Hayley's call from today, concerning his Echo results.

## 2021-10-24 ENCOUNTER — Ambulatory Visit: Payer: Medicaid Other | Admitting: Cardiology

## 2021-10-24 DIAGNOSIS — R262 Difficulty in walking, not elsewhere classified: Secondary | ICD-10-CM | POA: Diagnosis not present

## 2021-10-24 DIAGNOSIS — Z9181 History of falling: Secondary | ICD-10-CM | POA: Diagnosis not present

## 2021-10-24 DIAGNOSIS — M6281 Muscle weakness (generalized): Secondary | ICD-10-CM | POA: Diagnosis not present

## 2021-10-31 ENCOUNTER — Ambulatory Visit (INDEPENDENT_AMBULATORY_CARE_PROVIDER_SITE_OTHER): Payer: Medicare Other | Admitting: General Surgery

## 2021-10-31 ENCOUNTER — Encounter: Payer: Self-pay | Admitting: General Surgery

## 2021-10-31 VITALS — BP 179/79 | HR 63 | Temp 98.6°F | Resp 12 | Ht 66.0 in | Wt 216.0 lb

## 2021-10-31 DIAGNOSIS — K4020 Bilateral inguinal hernia, without obstruction or gangrene, not specified as recurrent: Secondary | ICD-10-CM | POA: Diagnosis not present

## 2021-10-31 NOTE — Progress Notes (Signed)
Terrance Miller; [id number]; 1928/02/02   HPI Patient is a 88yo BM who was referred to my care by Dr. Felipa Eth for evaluation and treatment of bilateral inguinal hernias.  They were found on CT scan of the abdomen for workup of large bilateral hydroceles.  Patient denies any groin pain and is more concerned about his swollen scrotum and difficulty voiding.  Is primarily wheelchair bound and has limited mobility.  Past Medical History:  Diagnosis Date   Acute ST elevation myocardial infarction (STEMI) of inferior wall ([place]) 2004   Coronary artery disease    DES x2 to the RCA 2004, residual disease managed medically   Hyperlipidemia    Hypertension    Type 2 diabetes mellitus ([place])     Past Surgical History:  Procedure Laterality Date   CORONARY ANGIOPLASTY WITH STENT PLACEMENT  09/05/2002   stent RCA, 80% first diagonal, 70-80% mid-diagonal, 80% mid LAD stenosis   NM MYOCAR PERF WALL MOTION  11/01/2008   Normal    Family History  Problem Relation Age of Onset   Hypertension Father    Diabetes Sister     Current Outpatient Medications on File Prior to Visit  Medication Sig Dispense Refill   aspirin EC 81 MG tablet Take 1 tablet (81 mg total) by mouth daily. Swallow whole. 90 tablet 3   carvedilol (COREG) 6.25 MG tablet Take 1 tablet (6.25 mg total) by mouth 2 (two) times daily. 180 tablet 3   cholecalciferol (VITAMIN D) 1000 units tablet Take 1,000 Units by mouth daily.     meclizine (ANTIVERT) 25 MG tablet Take 25 mg by mouth daily as needed for dizziness.     pravastatin (PRAVACHOL) 40 MG tablet Take 1 tablet (40 mg total) by mouth daily. 90 tablet 3   QUEtiapine (SEROQUEL) 25 MG tablet Take 0.5 tablets (12.5 mg total) by mouth at bedtime. 15 tablet 0   tamsulosin (FLOMAX) 0.4 MG CAPS capsule Take 0.4 mg by mouth daily.     TRAVATAN Z 0.004 % SOLN ophthalmic solution Place 1 drop into both eyes 2 (two) times daily.     No current facility-administered medications on file prior to  visit.    No Known Allergies  Social History   Substance and Sexual Activity  Alcohol Use Not Currently    Social History   Tobacco Use  Smoking Status Former   Types: Cigarettes  Smokeless Tobacco Never    Review of Systems  Constitutional:  Positive for malaise/fatigue.  HENT: Negative.    Eyes: Negative.   Respiratory:  Positive for shortness of breath and wheezing.   Cardiovascular: Negative.   Musculoskeletal:  Positive for back pain.  Skin: Negative.   Neurological:  Positive for dizziness and tremors.    Objective   Vitals:   10/31/21 1126  BP: (!) 179/79  Pulse: 63  Resp: 12  Temp: 98.6 F (37 C)  SpO2: 93%    Physical Exam Vitals reviewed.  Constitutional:      Appearance: Normal appearance. He is obese. He is not ill-appearing.     Comments: Examined in wheelchair  HENT:     Head: Normocephalic and atraumatic.  Cardiovascular:     Rate and Rhythm: Normal rate and regular rhythm.     Heart sounds: Normal heart sounds. No murmur heard.    No friction rub. No gallop.  Pulmonary:     Effort: Pulmonary effort is normal. No respiratory distress.     Breath sounds: Normal breath sounds. No stridor. No  wheezing, rhonchi or rales.     Comments: Poor inspiratory effort. Abdominal:     General: Bowel sounds are normal. There is no distension.     Palpations: Abdomen is soft. There is no mass.     Tenderness: There is no abdominal tenderness. There is no guarding.     Hernia: No hernia is present.     Comments: Examination somewhat limited as he appears to be wheelchair dependent.  No hernias appreciated.  Skin:    General: Skin is warm and dry.  Neurological:     Mental Status: He is alert and oriented to person, place, and time.   CT scan of the abdomen reviewed.  Assessment  Asymptomatic bilateral adipose containing inguinal hernias, which do not seem to be in continuity with the hydroceles.  Thus, I do not recommend primary repair of the  hernias at this time. Plan  I told the family that I will be on backup should Dr. Felipa Eth finds any evidence of a significant hernia.  Patient and family understands and agrees.

## 2021-11-04 ENCOUNTER — Ambulatory Visit (INDEPENDENT_AMBULATORY_CARE_PROVIDER_SITE_OTHER): Payer: Medicare Other | Admitting: Family Medicine

## 2021-11-04 ENCOUNTER — Encounter: Payer: Self-pay | Admitting: Family Medicine

## 2021-11-04 DIAGNOSIS — D472 Monoclonal gammopathy: Secondary | ICD-10-CM

## 2021-11-04 DIAGNOSIS — N1832 Chronic kidney disease, stage 3b: Secondary | ICD-10-CM

## 2021-11-04 DIAGNOSIS — D539 Nutritional anemia, unspecified: Secondary | ICD-10-CM | POA: Diagnosis not present

## 2021-11-04 DIAGNOSIS — I10 Essential (primary) hypertension: Secondary | ICD-10-CM

## 2021-11-04 DIAGNOSIS — R21 Rash and other nonspecific skin eruption: Secondary | ICD-10-CM

## 2021-11-04 DIAGNOSIS — E785 Hyperlipidemia, unspecified: Secondary | ICD-10-CM

## 2021-11-04 MED ORDER — CLOBETASOL PROPIONATE 0.05 % EX OINT: 1.0000 | TOPICAL_OINTMENT | Freq: Two times a day (BID) | CUTANEOUS | 0 refills | Status: DC

## 2021-11-04 NOTE — Patient Instructions (Signed)
Medication as prescribed.  Labs today.  Follow up in 3 months (pending labs).

## 2021-11-05 ENCOUNTER — Telehealth: Payer: Self-pay

## 2021-11-05 DIAGNOSIS — D472 Monoclonal gammopathy: Secondary | ICD-10-CM | POA: Insufficient documentation

## 2021-11-05 DIAGNOSIS — N1832 Chronic kidney disease, stage 3b: Secondary | ICD-10-CM | POA: Insufficient documentation

## 2021-11-05 DIAGNOSIS — R21 Rash and other nonspecific skin eruption: Secondary | ICD-10-CM | POA: Insufficient documentation

## 2021-11-05 LAB — LIPID PANEL
Chol/HDL Ratio: 3 ratio (ref 0.0–5.0)
Cholesterol, Total: 137 mg/dL (ref 100–199)
HDL: 46 mg/dL (ref >39)
LDL Chol Calc (NIH): 73 mg/dL (ref 0–99)
Triglycerides: 94 mg/dL (ref 0–149)
VLDL Cholesterol Cal: 18 mg/dL (ref 5–40)

## 2021-11-05 LAB — CBC
Hematocrit: 28.6 % — ABNORMAL LOW (ref 37.5–51.0)
Hemoglobin: 9.7 g/dL — ABNORMAL LOW (ref 13.0–17.7)
MCH: 35.9 pg — ABNORMAL HIGH (ref 26.6–33.0)
MCHC: 33.9 g/dL (ref 31.5–35.7)
MCV: 106 fL — ABNORMAL HIGH (ref 79–97)
NRBC: 1 % — ABNORMAL HIGH (ref 0–0)
Platelets: 124 10*3/uL — ABNORMAL LOW (ref 150–450)
RBC: 2.7 x10E6/uL — CL (ref 4.14–5.80)
RDW: 17.9 % — ABNORMAL HIGH (ref 11.6–15.4)
WBC: 5.1 10*3/uL (ref 3.4–10.8)

## 2021-11-05 LAB — CMP14+EGFR
ALT: 12 IU/L (ref 0–44)
AST: 20 IU/L (ref 0–40)
Albumin/Globulin Ratio: 1.2 (ref 1.2–2.2)
Albumin: 3.5 g/dL — ABNORMAL LOW (ref 3.6–4.6)
Alkaline Phosphatase: 54 IU/L (ref 44–121)
BUN/Creatinine Ratio: 10 (ref 10–24)
BUN: 16 mg/dL (ref 10–36)
Bilirubin Total: 1.2 mg/dL (ref 0.0–1.2)
CO2: 20 mmol/L (ref 20–29)
Calcium: 9 mg/dL (ref 8.6–10.2)
Chloride: 115 mmol/L — ABNORMAL HIGH (ref 96–106)
Creatinine, Ser: 1.6 mg/dL — ABNORMAL HIGH (ref 0.76–1.27)
Globulin, Total: 3 g/dL (ref 1.5–4.5)
Glucose: 132 mg/dL — ABNORMAL HIGH (ref 70–99)
Potassium: 3.8 mmol/L (ref 3.5–5.2)
Sodium: 150 mmol/L — ABNORMAL HIGH (ref 134–144)
Total Protein: 6.5 g/dL (ref 6.0–8.5)
eGFR: 40 mL/min/{1.73_m2} — ABNORMAL LOW (ref >59)

## 2021-11-05 LAB — IRON,TIBC AND FERRITIN PANEL
Ferritin: 664 ng/mL — ABNORMAL HIGH (ref 30–400)
Iron Saturation: 56 % — ABNORMAL HIGH (ref 15–55)
Iron: 127 ug/dL (ref 38–169)
Total Iron Binding Capacity: 226 ug/dL — ABNORMAL LOW (ref 250–450)
UIBC: 99 ug/dL — ABNORMAL LOW (ref 111–343)

## 2021-11-05 LAB — VITAMIN B12: Vitamin B-12: 814 pg/mL (ref 232–1245)

## 2021-11-05 LAB — FOLATE: Folate: 10.5 ng/mL (ref >3.0)
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
## 2021-11-05 NOTE — Telephone Encounter (Signed)
I spoke with daughter, Surgery date confirmed with daughter.9

## 2021-11-05 NOTE — Assessment & Plan Note (Signed)
Psoriasis vs Prurigo nodularis vs Lichen planus. Trial of clobetasol.

## 2021-11-05 NOTE — Assessment & Plan Note (Signed)
Needs to get back into see hematology again.  Anemia slightly worse at 9.7.

## 2021-11-05 NOTE — Assessment & Plan Note (Signed)
Labs obtained and creatinine is improved at 1.6.  We will continue to monitor closely.

## 2021-11-05 NOTE — Assessment & Plan Note (Signed)
At goal.  Continue carvedilol.

## 2021-11-07 NOTE — Addendum Note (Signed)
Addended by: Dairl Ponder on: 11/07/2021 01:30 PM   Modules accepted: Orders

## 2021-11-11 ENCOUNTER — Other Ambulatory Visit: Payer: Self-pay | Admitting: Physician Assistant

## 2021-11-11 DIAGNOSIS — D472 Monoclonal gammopathy: Secondary | ICD-10-CM

## 2021-11-11 DIAGNOSIS — D539 Nutritional anemia, unspecified: Secondary | ICD-10-CM

## 2021-11-11 NOTE — Progress Notes (Signed)
Lost to follow-up after visit in June 2023, was referred back by PCP due to worsening anemia. We are following patient for his MGUS and macrocytic anemia (MDS remains within differential). Labs ordered to be checked prior to follow-up visit: CBC/D CMP MMA Homocystine SPEP Free light chains LDH 24-hour urine with UPEP/immunofixation

## 2021-11-14 ENCOUNTER — Other Ambulatory Visit: Payer: Self-pay

## 2021-11-14 ENCOUNTER — Encounter (HOSPITAL_COMMUNITY): Payer: Self-pay

## 2021-11-14 NOTE — Pre-Procedure Instructions (Signed)
Spoke with daughter, Jackelyn Hoehn who is patients caregiver as well. I went over health history and pre-op instructions with her and she verbalized understanding of all.

## 2021-11-15 ENCOUNTER — Encounter (HOSPITAL_COMMUNITY): Payer: Self-pay

## 2021-11-15 ENCOUNTER — Encounter (HOSPITAL_COMMUNITY)
Admission: RE | Admit: 2021-11-15 | Discharge: 2021-11-15 | Disposition: A | Discharge status: Home / Self Care | Payer: Medicare Other | Source: Ambulatory Visit | Attending: Urology | Admitting: Urology

## 2021-11-20 ENCOUNTER — Ambulatory Visit (HOSPITAL_COMMUNITY): Payer: Medicare Other | Admitting: Anesthesiology

## 2021-11-20 ENCOUNTER — Encounter (HOSPITAL_COMMUNITY)
Admission: RE | Disposition: A | Discharge status: Home / Self Care | Payer: Self-pay | Source: Home / Self Care | Attending: Urology

## 2021-11-20 ENCOUNTER — Ambulatory Visit (HOSPITAL_COMMUNITY)
Admission: RE | Admit: 2021-11-20 | Discharge: 2021-11-20 | Disposition: A | Discharge status: Home / Self Care | Payer: Medicare Other | Attending: Urology | Admitting: Urology

## 2021-11-20 ENCOUNTER — Ambulatory Visit (HOSPITAL_BASED_OUTPATIENT_CLINIC_OR_DEPARTMENT_OTHER): Payer: Medicare Other | Admitting: Anesthesiology

## 2021-11-20 ENCOUNTER — Encounter (HOSPITAL_COMMUNITY): Payer: Self-pay | Admitting: Urology

## 2021-11-20 ENCOUNTER — Other Ambulatory Visit: Payer: Self-pay

## 2021-11-20 DIAGNOSIS — I252 Old myocardial infarction: Secondary | ICD-10-CM | POA: Insufficient documentation

## 2021-11-20 DIAGNOSIS — N1832 Chronic kidney disease, stage 3b: Secondary | ICD-10-CM

## 2021-11-20 DIAGNOSIS — K4020 Bilateral inguinal hernia, without obstruction or gangrene, not specified as recurrent: Secondary | ICD-10-CM | POA: Diagnosis not present

## 2021-11-20 DIAGNOSIS — N3941 Urge incontinence: Secondary | ICD-10-CM | POA: Insufficient documentation

## 2021-11-20 DIAGNOSIS — N433 Hydrocele, unspecified: Secondary | ICD-10-CM

## 2021-11-20 DIAGNOSIS — Z87891 Personal history of nicotine dependence: Secondary | ICD-10-CM | POA: Diagnosis not present

## 2021-11-20 DIAGNOSIS — I10 Essential (primary) hypertension: Secondary | ICD-10-CM | POA: Insufficient documentation

## 2021-11-20 DIAGNOSIS — E119 Type 2 diabetes mellitus without complications: Secondary | ICD-10-CM | POA: Insufficient documentation

## 2021-11-20 DIAGNOSIS — I2510 Atherosclerotic heart disease of native coronary artery without angina pectoris: Secondary | ICD-10-CM | POA: Diagnosis not present

## 2021-11-20 DIAGNOSIS — Z955 Presence of coronary angioplasty implant and graft: Secondary | ICD-10-CM | POA: Diagnosis not present

## 2021-11-20 DIAGNOSIS — R351 Nocturia: Secondary | ICD-10-CM | POA: Diagnosis not present

## 2021-11-20 DIAGNOSIS — I129 Hypertensive chronic kidney disease with stage 1 through stage 4 chronic kidney disease, or unspecified chronic kidney disease: Secondary | ICD-10-CM | POA: Diagnosis not present

## 2021-11-20 HISTORY — PX: HYDROCELE EXCISION: SHX482

## 2021-11-20 LAB — GLUCOSE, CAPILLARY
Glucose-Capillary: 105 mg/dL — ABNORMAL HIGH (ref 70–99)
Glucose-Capillary: 116 mg/dL — ABNORMAL HIGH (ref 70–99)

## 2021-11-20 SURGERY — HYDROCELECTOMY
Anesthesia: General | Laterality: Bilateral

## 2021-11-20 MED ORDER — FENTANYL CITRATE (PF) 100 MCG/2ML IJ SOLN
INTRAMUSCULAR | Status: AC
  Filled 2021-11-20: qty 2

## 2021-11-20 MED ORDER — GLYCOPYRROLATE 0.2 MG/ML IJ SOLN
INTRAMUSCULAR | Status: DC | PRN
  Administered 2021-11-20: .2 mg via INTRAVENOUS

## 2021-11-20 MED ORDER — FENTANYL CITRATE (PF) 100 MCG/2ML IJ SOLN
INTRAMUSCULAR | Status: DC | PRN
  Administered 2021-11-20: 25 ug via INTRAVENOUS

## 2021-11-20 MED ORDER — EPHEDRINE 5 MG/ML INJ
INTRAVENOUS | Status: AC
  Filled 2021-11-20: qty 5

## 2021-11-20 MED ORDER — PROPOFOL 10 MG/ML IV BOLUS
INTRAVENOUS | Status: DC | PRN
  Administered 2021-11-20: 150 mg via INTRAVENOUS

## 2021-11-20 MED ORDER — CEFAZOLIN SODIUM-DEXTROSE 2-4 GM/100ML-% IV SOLN
2.0000 g | INTRAVENOUS | Status: AC
  Administered 2021-11-20: 2 g via INTRAVENOUS

## 2021-11-20 MED ORDER — CEFAZOLIN SODIUM-DEXTROSE 2-4 GM/100ML-% IV SOLN
INTRAVENOUS | Status: AC
  Filled 2021-11-20: qty 100

## 2021-11-20 MED ORDER — SUGAMMADEX SODIUM 200 MG/2ML IV SOLN
INTRAVENOUS | Status: DC | PRN
  Administered 2021-11-20: 200 mg via INTRAVENOUS

## 2021-11-20 MED ORDER — CHLORHEXIDINE GLUCONATE 0.12 % MT SOLN
OROMUCOSAL | Status: AC
  Administered 2021-11-20: 15 mL via OROMUCOSAL
  Filled 2021-11-20: qty 15

## 2021-11-20 MED ORDER — ROCURONIUM BROMIDE 10 MG/ML (PF) SYRINGE
PREFILLED_SYRINGE | INTRAVENOUS | Status: AC
  Filled 2021-11-20: qty 10

## 2021-11-20 MED ORDER — EPHEDRINE SULFATE (PRESSORS) 50 MG/ML IJ SOLN
INTRAMUSCULAR | Status: DC | PRN
  Administered 2021-11-20: 5 mg via INTRAVENOUS

## 2021-11-20 MED ORDER — ROCURONIUM 10MG/ML (10ML) SYRINGE FOR MEDFUSION PUMP - OPTIME
INTRAVENOUS | Status: DC | PRN
  Administered 2021-11-20: 50 mg via INTRAVENOUS

## 2021-11-20 MED ORDER — BUPIVACAINE HCL (PF) 0.25 % IJ SOLN
INTRAMUSCULAR | Status: DC | PRN
  Administered 2021-11-20: 8 mL

## 2021-11-20 MED ORDER — HYDROCODONE-ACETAMINOPHEN 5-325 MG PO TABS: 1.0000 | ORAL_TABLET | Freq: Four times a day (QID) | ORAL | 0 refills | Status: DC | PRN

## 2021-11-20 MED ORDER — LIDOCAINE HCL (PF) 2 % IJ SOLN
INTRAMUSCULAR | Status: AC
  Filled 2021-11-20: qty 5

## 2021-11-20 MED ORDER — ONDANSETRON HCL 4 MG/2ML IJ SOLN
INTRAMUSCULAR | Status: DC | PRN
  Administered 2021-11-20: 4 mg via INTRAVENOUS

## 2021-11-20 MED ORDER — OXYCODONE HCL 5 MG PO TABS: 5.0000 mg | ORAL_TABLET | Freq: Once | ORAL | Status: DC | PRN

## 2021-11-20 MED ORDER — LIDOCAINE HCL (CARDIAC) PF 50 MG/5ML IV SOSY
PREFILLED_SYRINGE | INTRAVENOUS | Status: DC | PRN
  Administered 2021-11-20: 100 mg via INTRAVENOUS

## 2021-11-20 MED ORDER — ONDANSETRON HCL 4 MG/2ML IJ SOLN
INTRAMUSCULAR | Status: AC
  Filled 2021-11-20: qty 2

## 2021-11-20 MED ORDER — OXYCODONE HCL 5 MG/5ML PO SOLN: 5.0000 mg | Freq: Once | ORAL | Status: DC | PRN

## 2021-11-20 MED ORDER — FENTANYL CITRATE PF 50 MCG/ML IJ SOSY: 25.0000 ug | PREFILLED_SYRINGE | INTRAMUSCULAR | Status: DC | PRN

## 2021-11-20 MED ORDER — LACTATED RINGERS IV SOLN: INTRAVENOUS | Status: DC

## 2021-11-20 MED ORDER — CHLORHEXIDINE GLUCONATE 0.12 % MT SOLN: 15.0000 mL | Freq: Once | OROMUCOSAL | Status: AC

## 2021-11-20 MED ORDER — CEPHALEXIN 500 MG PO CAPS: 500.0000 mg | ORAL_CAPSULE | Freq: Three times a day (TID) | ORAL | 0 refills | Status: AC

## 2021-11-20 MED ORDER — 0.9 % SODIUM CHLORIDE (POUR BTL) OPTIME
TOPICAL | Status: DC | PRN
  Administered 2021-11-20: 1000 mL

## 2021-11-20 MED ORDER — PROPOFOL 10 MG/ML IV BOLUS
INTRAVENOUS | Status: AC
  Filled 2021-11-20: qty 20

## 2021-11-20 MED ORDER — ORAL CARE MOUTH RINSE: 15.0000 mL | Freq: Once | OROMUCOSAL | Status: AC

## 2021-11-20 MED ORDER — BUPIVACAINE HCL (PF) 0.25 % IJ SOLN
INTRAMUSCULAR | Status: AC
  Filled 2021-11-20: qty 30

## 2021-11-20 MED ORDER — ONDANSETRON HCL 4 MG/2ML IJ SOLN: 4.0000 mg | Freq: Once | INTRAMUSCULAR | Status: DC | PRN

## 2021-11-20 SURGICAL SUPPLY — 33 items
ADH SKN CLS APL DERMABOND .7 (GAUZE/BANDAGES/DRESSINGS) ×1
BLADE SURG 15 STRL LF DISP TIS (BLADE) ×1 IMPLANT
BLADE SURG 15 STRL SS (BLADE) ×1
COVER LIGHT HANDLE STERIS (MISCELLANEOUS) ×2 IMPLANT
DECANTER SPIKE VIAL GLASS SM (MISCELLANEOUS) ×1 IMPLANT
DERMABOND ADVANCED .7 DNX12 (GAUZE/BANDAGES/DRESSINGS) ×1 IMPLANT
DRAIN PENROSE 1X12 (DRAIN) ×1 IMPLANT
ELECT REM PT RETURN 9FT ADLT (ELECTROSURGICAL) ×1
ELECTRODE REM PT RTRN 9FT ADLT (ELECTROSURGICAL) ×1 IMPLANT
GAUZE 4X4 16PLY ~~LOC~~+RFID DBL (SPONGE) ×1 IMPLANT
GAUZE SPONGE 4X4 12PLY STRL (GAUZE/BANDAGES/DRESSINGS) ×2 IMPLANT
GLOVE BIO SURGEON STRL SZ8 (GLOVE) ×1 IMPLANT
GLOVE BIOGEL M 6.5 STRL (GLOVE) IMPLANT
GLOVE BIOGEL PI IND STRL 6.5 (GLOVE) IMPLANT
GLOVE BIOGEL PI IND STRL 7.0 (GLOVE) ×2 IMPLANT
GLOVE BIOGEL PI IND STRL 8 (GLOVE) ×1 IMPLANT
GOWN STRL REUS W/TWL LRG LVL3 (GOWN DISPOSABLE) ×1 IMPLANT
GOWN STRL REUS W/TWL XL LVL3 (GOWN DISPOSABLE) ×1 IMPLANT
KIT TURNOVER KIT A (KITS) ×1 IMPLANT
MANIFOLD NEPTUNE II (INSTRUMENTS) ×1 IMPLANT
NDL HYPO 25X1 1.5 SAFETY (NEEDLE) ×1 IMPLANT
NEEDLE HYPO 25X1 1.5 SAFETY (NEEDLE) ×1 IMPLANT
NS IRRIG 1000ML POUR BTL (IV SOLUTION) ×1 IMPLANT
PACK MINOR (CUSTOM PROCEDURE TRAY) ×1 IMPLANT
PAD ARMBOARD 7.5X6 YLW CONV (MISCELLANEOUS) ×1 IMPLANT
SET BASIN LINEN APH (SET/KITS/TRAYS/PACK) ×1 IMPLANT
SUPPORT SCROTAL LG STRP (MISCELLANEOUS) ×1 IMPLANT
SUPPORT SCROTAL LRG NO STRP (SOFTGOODS) IMPLANT
SUT CHROMIC 3 0 SH 27 (SUTURE) ×1 IMPLANT
SUT MNCRL AB 4-0 PS2 18 (SUTURE) IMPLANT
SUT VIC AB 3-0 SH 27 (SUTURE) ×1
SUT VIC AB 3-0 SH 27X BRD (SUTURE) ×1 IMPLANT
SYR CONTROL 10ML LL (SYRINGE) ×1 IMPLANT

## 2021-11-20 NOTE — Anesthesia Postprocedure Evaluation (Signed)
Anesthesia Post Note  Patient: Terrance Miller  Procedure(s) Performed: HYDROCELECTOMY ADULT (Bilateral)  Patient location during evaluation: PACU Anesthesia Type: General Level of consciousness: awake and alert Pain management: pain level controlled Vital Signs Assessment: post-procedure vital signs reviewed and stable Respiratory status: spontaneous breathing, nonlabored ventilation, respiratory function stable and patient connected to nasal cannula oxygen Cardiovascular status: blood pressure returned to baseline and stable Postop Assessment: no apparent nausea or vomiting Anesthetic complications: no   No notable events documented.   Last Vitals:  Vitals:   11/20/21 1030 11/20/21 1042  BP: (!) 170/89   Pulse:    Resp:    Temp: (!) 36.4 C   SpO2:  97%    Last Pain:  Vitals:   11/20/21 1015  TempSrc:   PainSc: 0-No pain                 Berklie Dethlefs Clyde Canterbury

## 2021-11-20 NOTE — Anesthesia Procedure Notes (Addendum)
Procedure Name: Intubation Date/Time: 11/20/2021 7:50 AM  Performed by: Ollen Bowl, CRNAPre-anesthesia Checklist: Patient identified, Patient being monitored, Timeout performed, Emergency Drugs available and Suction available Patient Re-evaluated:Patient Re-evaluated prior to induction Oxygen Delivery Method: Circle system utilized Preoxygenation: Pre-oxygenation with 100% oxygen Induction Type: IV induction Ventilation: Mask ventilation without difficulty Laryngoscope Size: Mac and 3 Grade View: Grade I Tube type: Oral Tube size: 7.5 mm Number of attempts: 1 Airway Equipment and Method: Stylet Placement Confirmation: ETT inserted through vocal cords under direct vision, positive ETCO2 and breath sounds checked- equal and bilateral Secured at: 21 cm Tube secured with: Tape Dental Injury: Teeth and Oropharynx as per pre-operative assessment  Comments: Inadequate ventilation with LMA's, large leak at 18 cmH20 and small TV.  Placed #7.5 ETT without difflculty.

## 2021-11-20 NOTE — H&P (Signed)
Assessment: 1. Hydrocele - bilateral   2. Non-recurrent bilateral inguinal hernia without obstruction or gangrene       Plan: I reviewed the scrotal ultrasound results and CT results from July 2023 obtained since his last visit with me. Diagnosis and management of a hydrocele again discussed with the patient and his daughter today.  Surgical management with hydrocelectomy discussed.  Risks of procedure reviewed in detail.  Specifically, we discussed the increased risk of anesthesia given his advanced age.  I also discussed the risk of infection, bleeding, injury to scrotal structures, possible recurrence of the hydrocele, and need for additional procedures. He is interested in proceeding with bilateral hydrocelectomies.   Procedure: The patient will be scheduled for bilateral hydrocelectomy at [facility].  Surgical request is placed with the surgery schedulers and will be scheduled at the patient's/family request. Informed consent is given as documented below. Anesthesia: General   The patient does not  have sleep apnea, history of MRSA, history of VRE, history of cardiac device requiring special anesthetic needs. Patient is stable and considered clear for surgical in an outpatient ambulatory surgery setting as well as patient hospital setting.   Consent for Operation or Procedure: Provider Certification I hereby certify that the nature, purpose, benefits, usual and most frequent risks of, and alternatives to, the operation or procedure have been explained to the patient (or person authorized to sign for the patient) either by me as responsible physician or by the provider who is to perform the operation or procedure. Time spent such that the patient/family has had an opportunity to ask questions, and that those questions have been answered. The patient or the patient's representative has been advised that selected tasks may be performed by assistants to the primary health care provider(s). I  believe that the patient (or person authorized to sign for the patient) understands what has been explained, and has consented to the operation or procedure. No guarantees were implied or made.   Chief Complaint:     Chief Complaint  Patient presents with   Hydrocele      History of Present Illness:   Terrance Miller is a 88 y.o. year old male who is seen for further evaluation of large bilateral hydroceles.  He has a history of a scrotal hydrocele for a number of years.  This was previously evaluated with an ultrasound in 4/19 which showed a large left hydrocele and trace right hydrocele with normal testes bilaterally.  He was recently seen in the emergency room for a apparent increase in the size of the hydrocele.  He reports some difficulty with urination due to the hydrocele.  He does have some mild discomfort associated with a hydrocele.  He reports nocturia and urge incontinence.  No dysuria or gross hematuria.  He is currently on tamsulosin. AUA score = 16. Scrotal ultrasound from 11/15/2020 shows large bilateral hydroceles with debris in the right hydrocele, compression of the testicles by the hydrocele with normal blood flow. He was seen in March 2023 at which time surgical management of his bilateral hydroceles was discussed.  Preoperative assessment by primary care and cardiology was recommended. He underwent evaluation with a scrotal ultrasound on 07/19/2021 due to increased pain.  The scrotal ultrasound showed normal testes bilaterally, large right hydrocele with debris and large left hydrocele. He was admitted to the hospital in late July 2023.  A repeat scrotal ultrasound at that time showed a large minimally complicated right sided hydrocele, unchanged from November 2022, moderate-sized left hydrocele  without significant change from November 2022. CT imaging from 07/27/2021 showed moderate left and small right fat-containing inguinal hernias.     He returns today for follow-up.  He  remains interested in proceeding with surgical management of his bilateral hydroceles.  He has not seen any significant change in the size of the bilateral hydroceles.  No recent pain.  He does have difficulty with urination due to the size of his scrotum and difficulty retracting the foreskin.   Portions of the above documentation were copied from a prior visit for review purposes only.     Past Medical History:      Past Medical History:  Diagnosis Date   Acute ST elevation myocardial infarction (STEMI) of inferior wall ([place]) 2004   Coronary artery disease      DES x2 to the RCA 2004, residual disease managed medically   Hyperlipidemia     Hypertension     Type 2 diabetes mellitus ([place])        Past Surgical History:       Past Surgical History:  Procedure Laterality Date   CORONARY ANGIOPLASTY WITH STENT PLACEMENT   09/05/2002    stent RCA, 80% first diagonal, 70-80% mid-diagonal, 80% mid LAD stenosis   NM MYOCAR PERF WALL MOTION   11/01/2008    Normal      Allergies:  No Known Allergies   Family History:       Family History  Problem Relation Age of Onset   Hypertension Father     Diabetes Sister        Social History:  Social History         Tobacco Use   Smoking status: Former      Types: Cigarettes   Smokeless tobacco: Never  Vaping Use   Vaping Use: Never used  Substance Use Topics   Alcohol use: Not Currently   Drug use: No      ROS: Constitutional:  Negative for fever, chills, weight loss CV: Negative for chest pain, previous MI, hypertension Respiratory:  Negative for shortness of breath, wheezing, sleep apnea, frequent cough GI:  Negative for nausea, vomiting, bloody stool, GERD   Physical exam: BP (!) 192/89   Pulse (!) 57   Ht '5\' 6"'$  (1.676 m)   Wt 216 lb (98 kg)   BMI 34.86 kg/m  GENERAL APPEARANCE:  Well appearing, well developed, well nourished, NAD HEENT:  Atraumatic, normocephalic, oropharynx clear NECK:  Supple without  lymphadenopathy or thyromegaly ABDOMEN:  Soft, non-tender, no masses EXTREMITIES:  Moves all extremities well, without clubbing, cyanosis; bilateral lower extremity edema  NEUROLOGIC:  Alert and oriented x 3, in wheelchair, CN II-XII grossly intact MENTAL STATUS:  appropriate BACK:  Non-tender to palpation, No CVAT SKIN:  Warm, dry, and intact GU: Penis: Uncircumcised, unable to retract foreskin due to weight of scrotum Scrotum: Large bilateral hydroceles, R>L Testis: Unable to palpate secondary to hydroceles      Results: None

## 2021-11-20 NOTE — Transfer of Care (Signed)
Immediate Anesthesia Transfer of Care Note  Patient: Terrance Miller  Procedure(s) Performed: HYDROCELECTOMY ADULT (Bilateral)  Patient Location: PACU  Anesthesia Type:General  Level of Consciousness: drowsy  Airway & Oxygen Therapy: Patient Spontanous Breathing  Post-op Assessment: Report given to RN and Post -op Vital signs reviewed and stable  Post vital signs: Reviewed and stable  Last Vitals:  Vitals Value Taken Time  BP 155/88 11/20/21 0936  Temp    Pulse 52 11/20/21 0938  Resp 15 11/20/21 0938  SpO2 100 % 11/20/21 0938  Vitals shown include unvalidated device data.  Last Pain:  Vitals:   11/20/21 0658  TempSrc: Oral  PainSc: 0-No pain         Complications: No notable events documented.

## 2021-11-20 NOTE — Interval H&P Note (Signed)
History and Physical Interval Note:  11/20/2021 7:10 AM  Terrance Miller  has presented today for surgery, with the diagnosis of bilateral hydroceles.  The various methods of treatment have been discussed with the patient and family. After consideration of risks, benefits and other options for treatment, the patient has consented to  Procedure(s): HYDROCELECTOMY ADULT (Bilateral) as a surgical intervention.  The patient's history has been reviewed, patient examined, no change in status, stable for surgery.  I have reviewed the patient's chart and labs.  Questions were answered to the patient's satisfaction.     Michaelle Birks

## 2021-11-20 NOTE — Anesthesia Preprocedure Evaluation (Signed)
Anesthesia Evaluation  Patient identified by MRN, date of birth, ID band Patient awake    Reviewed: Allergy & Precautions, H&P , NPO status , Patient's Chart, lab work & pertinent test results, reviewed documented beta blocker date and time   Airway Mallampati: II  TM Distance: >3 FB Neck ROM: full    Dental  (+) Missing   Pulmonary neg pulmonary ROS, former smoker   Pulmonary exam normal breath sounds clear to auscultation       Cardiovascular Exercise Tolerance: Good hypertension, + CAD, + Past MI and + Cardiac Stents (DES x2 2004)   Rhythm:regular Rate:Normal     Neuro/Psych negative neurological ROS  negative psych ROS   GI/Hepatic negative GI ROS, Neg liver ROS,,,  Endo/Other  negative endocrine ROSdiabetes    Renal/GU CRFRenal disease  negative genitourinary   Musculoskeletal   Abdominal   Peds  Hematology negative hematology ROS (+)   Anesthesia Other Findings ECHO 09/19/21: EF 55-60% Cardiac clearance 09/13/21, intermediate risk  Reproductive/Obstetrics negative OB ROS                             Anesthesia Physical Anesthesia Plan  ASA: 3  Anesthesia Plan: General   Post-op Pain Management:    Induction:   PONV Risk Score and Plan:   Airway Management Planned:   Additional Equipment:   Intra-op Plan:   Post-operative Plan:   Informed Consent: I have reviewed the patients History and Physical, chart, labs and discussed the procedure including the risks, benefits and alternatives for the proposed anesthesia with the patient or authorized representative who has indicated his/her understanding and acceptance.     Dental Advisory Given  Plan Discussed with: CRNA  Anesthesia Plan Comments:         Anesthesia Quick Evaluation

## 2021-11-20 NOTE — Op Note (Signed)
OPERATIVE NOTE   Patient Name: Terrance Miller  MRN: [id number]   Date of Procedure: 11/20/21  Preoperative diagnosis:  Bilateral hydroceles  Postoperative diagnosis:  Bilateral hydroceles  Procedure:  Bilateral hydrocelectomy  Attending: Primus Bravo, MD  Anesthesia: General with local  Estimated blood loss: 30 mL  Fluids: Per anesthesia record  Drains: 1/2 inch Penrose drains in bilateral scrotum  Specimens: Bilateral ureteral hydrocele sac  Antibiotics: Ancef 2 g IV  Findings: Extremely large right hydrocele with 1750 mL of fluid; large left hydrocele with 450 mL of fluid; no hernia identified on either side  Indications:  88 year old male with bilateral hydroceles, right > left, presents for bilateral hydrocelectomy.  He has had hydroceles for a number of years with gradual increase in size.  Imaging with a scrotal ultrasound in July 2023 showed a large right hydrocele and a slightly smaller left hydrocele with normal testes bilaterally.  CT imaging in July 2023 showed a possible left inguinal hernia and a small right fat-containing inguinal hernia.  He was seen by general surgery and was not felt to require surgical intervention for his inguinal hernia.  He presents now for bilateral hydrocelectomy.  The procedure including potential risk have been discussed in detail.  He understands wishes to proceed as described.  Description of Procedure:  The patient received IV Ancef preoperatively.  After successful induction of a general anesthetic, the patient was placed on the operating table in the supine position.  The patient's genital area was prepped and draped in sterile fashion.  Examination demonstrated a significantly enlarged scrotum, right > left, consistent with the known hydroceles.  There was also some edema of the subcutaneous tissue superiorly.  There were some small areas of superficial skin breakdown on the superior scrotum.  A midline incision was made along  the median raphae.  The incision was carried down through dartos fascia.  The large right hydrocele was delivered into the wound.  The hydrocele sac was opened with return of 1750 mL of clear fluid.  The hydrocele sac was fairly thin.  The testicle and epididymis were examined and noted to be normal.  The excess hydrocele sac was excised.  Hemostasis was obtained.  The edges of the hydrocele sac were then everted behind the testicle and cord and sutured together using a running 3-0 Vicryl suture.  Care was taken to avoid any undue tension on the cord.  The right testicle was then placed back into its normal position.  Attention was then turned to the left scrotum.  The left hydrocele sac was carefully delivered into the wound.  The hydrocele sac was opened with return of 450 mL of clear fluid.  Again a thin walled sac was noted.  There was no evidence of any hernia on either side.  The excess hydrocele sac was excised on the left.  The edges were everted and sutured together behind the status and cord using a running 3-0 Vicryl suture.  Again care was taken to avoid any tension on the cord.  The left testicle was placed back into its normal position.  1/2 inch Penrose drains were placed in to the dependent portion of each scrotum and secured in place using a 3-0 suture.  The wound was irrigated.  The wound was closed in 2 layers.  A 3-0 Vicryl suture was used to close dartos fascia.  A good hemostatic closure was obtained.  8 mL of 0.25% plain Marcaine was injected in to the wound.  The small  areas of superficial skin breakdown were excised as they were at the superior aspect of the wound.  The skin edges were then approximated using a running 3-0 chromic suture.  Several interrupted 3-0 chromic sutures were also placed.  There was good hemostasis.  Dermabond was applied to the incision.  A sterile dressing with fluffs and a scrotal support was placed.  The patient was then extubated and taken to the postanesthesia  care unit in stable condition.  Complications: None  Condition: Stable, extubated, transferred to PACU  Plan:  Discharge to home today. Local care to scrotum. Return to office in 2 days for removal of Penrose drains

## 2021-11-20 NOTE — Anesthesia Procedure Notes (Signed)
Procedure Name: LMA Insertion Date/Time: 11/20/2021 7:38 AM  Performed by: Ollen Bowl, CRNAPre-anesthesia Checklist: Patient identified, Patient being monitored, Emergency Drugs available, Timeout performed and Suction available Patient Re-evaluated:Patient Re-evaluated prior to induction Oxygen Delivery Method: Circle System Utilized Preoxygenation: Pre-oxygenation with 100% oxygen Induction Type: IV induction Ventilation: Mask ventilation without difficulty LMA: LMA inserted LMA Size: 4.0 and 5.0 Number of attempts: 1 Placement Confirmation: positive ETCO2 and breath sounds checked- equal and bilateral

## 2021-11-21 ENCOUNTER — Telehealth: Payer: Self-pay

## 2021-11-21 LAB — SURGICAL PATHOLOGY

## 2021-11-21 NOTE — Telephone Encounter (Signed)
Patient's daughter states the scrotal support pt is wearing has been soiled with urine.  She is asking if she is able to remove it to clean the patient before his apt tomorrow.  For now daughter states she will just do a sponge bath and clean around the area.  Please advise.

## 2021-11-22 ENCOUNTER — Ambulatory Visit (INDEPENDENT_AMBULATORY_CARE_PROVIDER_SITE_OTHER): Payer: Medicare Other | Admitting: Urology

## 2021-11-22 ENCOUNTER — Encounter: Payer: Self-pay | Admitting: Urology

## 2021-11-22 VITALS — BP 159/82 | HR 56 | Ht 66.0 in | Wt 225.0 lb

## 2021-11-22 DIAGNOSIS — N433 Hydrocele, unspecified: Secondary | ICD-10-CM

## 2021-11-22 NOTE — Telephone Encounter (Signed)
Daughter aware.

## 2021-11-22 NOTE — Progress Notes (Signed)
Assessment: 1. Hydrocele - bilateral     Plan: Penrose drains removed today. Complete antibiotics.  Local care to the scrotal wound. Return to office in 3-4 weeks.  Chief Complaint:  Chief Complaint  Patient presents with   Hydrocele    History of Present Illness:  Terrance Miller is a 88 y.o. year old male who is seen for further evaluation of large bilateral hydroceles.  He has a history of a scrotal hydrocele for a number of years.  This was previously evaluated with an ultrasound in 4/19 which showed a large left hydrocele and trace right hydrocele with normal testes bilaterally.  He was recently seen in the emergency room for a apparent increase in the size of the hydrocele.  He reports some difficulty with urination due to the hydrocele.  He does have some mild discomfort associated with a hydrocele.  He reports nocturia and urge incontinence.  No dysuria or gross hematuria.  He is currently on tamsulosin. AUA score = 16. Scrotal ultrasound from 11/15/2020 shows large bilateral hydroceles with debris in the right hydrocele, compression of the testicles by the hydrocele with normal blood flow. He was seen in March 2023 at which time surgical management of his bilateral hydroceles was discussed.  Preoperative assessment by primary care and cardiology was recommended. He underwent evaluation with a scrotal ultrasound on 07/19/2021 due to increased pain.  The scrotal ultrasound showed normal testes bilaterally, large right hydrocele with debris and large left hydrocele. He was admitted to the hospital in late July 2023.  A repeat scrotal ultrasound at that time showed a large minimally complicated right sided hydrocele, unchanged from November 2022, moderate-sized left hydrocele without significant change from November 2022. CT imaging from 07/27/2021 showed moderate left and small right fat-containing inguinal hernias.  He underwent bilateral hydrocelectomies on 11/20/2021.  A total of  1750 mL was drained from the right hydrocele and 450 mL drained from the left hydrocele.  Pathology consistent with benign hydrocele sac.  He has bilateral Penrose drains in place.  Portions of the above documentation were copied from a prior visit for review purposes only.   Past Medical History:  Past Medical History:  Diagnosis Date   Acute ST elevation myocardial infarction (STEMI) of inferior wall ([place]) 2004   Coronary artery disease    DES x2 to the RCA 2004, residual disease managed medically   Hyperlipidemia    Hypertension    Type 2 diabetes mellitus ([place])     Past Surgical History:  Past Surgical History:  Procedure Laterality Date   CATARACT EXTRACTION Bilateral    CORONARY ANGIOPLASTY WITH STENT PLACEMENT  09/05/2002   stent RCA, 80% first diagonal, 70-80% mid-diagonal, 80% mid LAD stenosis   NM MYOCAR PERF WALL MOTION  11/01/2008   Normal    Allergies:  Not on File  Family History:  Family History  Problem Relation Age of Onset   Hypertension Father    Diabetes Sister     Social History:  Social History   Tobacco Use   Smoking status: Former    Packs/day: 0.25    Types: Cigarettes    Quit date: 11/18/2005    Years since quitting: 16.0   Smokeless tobacco: Never  Vaping Use   Vaping Use: Never used  Substance Use Topics   Alcohol use: Not Currently   Drug use: No    ROS: Constitutional:  Negative for fever, chills, weight loss CV: Negative for chest pain, previous MI, hypertension Respiratory:  Negative  for shortness of breath, wheezing, sleep apnea, frequent cough GI:  Negative for nausea, vomiting, bloody stool, GERD  Physical exam: BP (!) 159/82   Pulse (!) 56   Ht '5\' 6"'$  (1.676 m)   Wt 225 lb (102.1 kg)   BMI 36.32 kg/m  GU:  edema of scrotum; incision intact; Penrose drains removed    Results: None

## 2021-11-24 DIAGNOSIS — R262 Difficulty in walking, not elsewhere classified: Secondary | ICD-10-CM | POA: Diagnosis not present

## 2021-11-24 DIAGNOSIS — Z9181 History of falling: Secondary | ICD-10-CM | POA: Diagnosis not present

## 2021-11-24 DIAGNOSIS — M6281 Muscle weakness (generalized): Secondary | ICD-10-CM | POA: Diagnosis not present

## 2021-11-25 ENCOUNTER — Telehealth: Payer: Self-pay

## 2021-11-25 ENCOUNTER — Other Ambulatory Visit: Payer: Self-pay | Admitting: Family Medicine

## 2021-11-25 NOTE — Telephone Encounter (Signed)
Daughter states patient is experiencing confusion and she noticed a foul smelling urine.  He is currently taking the keflex 500, pt still has 2 days left.  Daughter denies fever.  Does pt need to come in for apt for ua or do you recommend an alternative abx.  Please advise.

## 2021-11-26 ENCOUNTER — Encounter (HOSPITAL_COMMUNITY): Payer: Self-pay | Admitting: Urology

## 2021-11-26 ENCOUNTER — Other Ambulatory Visit: Payer: Self-pay | Admitting: Urology

## 2021-11-26 MED ORDER — CIPROFLOXACIN HCL 250 MG PO TABS: 250.0000 mg | ORAL_TABLET | Freq: Two times a day (BID) | ORAL | 0 refills | Status: DC

## 2021-11-26 NOTE — Telephone Encounter (Signed)
Daughter aware.

## 2021-11-30 ENCOUNTER — Inpatient Hospital Stay (HOSPITAL_COMMUNITY)
Admission: EM | Admit: 2021-11-30 | Discharge: 2021-12-12 | DRG: 640 | Disposition: A | Discharge status: Skilled Nursing Facility | Payer: Medicare Other | Attending: Internal Medicine | Admitting: Internal Medicine

## 2021-11-30 ENCOUNTER — Encounter (HOSPITAL_COMMUNITY): Payer: Self-pay | Admitting: Internal Medicine

## 2021-11-30 ENCOUNTER — Emergency Department (HOSPITAL_COMMUNITY): Payer: Medicare Other

## 2021-11-30 DIAGNOSIS — N4889 Other specified disorders of penis: Secondary | ICD-10-CM | POA: Diagnosis present

## 2021-11-30 DIAGNOSIS — N433 Hydrocele, unspecified: Secondary | ICD-10-CM | POA: Diagnosis present

## 2021-11-30 DIAGNOSIS — N1832 Chronic kidney disease, stage 3b: Secondary | ICD-10-CM | POA: Diagnosis present

## 2021-11-30 DIAGNOSIS — N1831 Chronic kidney disease, stage 3a: Secondary | ICD-10-CM | POA: Diagnosis present

## 2021-11-30 DIAGNOSIS — Z7401 Bed confinement status: Secondary | ICD-10-CM | POA: Diagnosis not present

## 2021-11-30 DIAGNOSIS — E878 Other disorders of electrolyte and fluid balance, not elsewhere classified: Secondary | ICD-10-CM | POA: Diagnosis present

## 2021-11-30 DIAGNOSIS — E785 Hyperlipidemia, unspecified: Secondary | ICD-10-CM | POA: Diagnosis not present

## 2021-11-30 DIAGNOSIS — E8770 Fluid overload, unspecified: Secondary | ICD-10-CM | POA: Diagnosis not present

## 2021-11-30 DIAGNOSIS — E1122 Type 2 diabetes mellitus with diabetic chronic kidney disease: Secondary | ICD-10-CM | POA: Diagnosis not present

## 2021-11-30 DIAGNOSIS — E119 Type 2 diabetes mellitus without complications: Secondary | ICD-10-CM | POA: Diagnosis not present

## 2021-11-30 DIAGNOSIS — R52 Pain, unspecified: Secondary | ICD-10-CM | POA: Diagnosis not present

## 2021-11-30 DIAGNOSIS — G9341 Metabolic encephalopathy: Secondary | ICD-10-CM | POA: Diagnosis present

## 2021-11-30 DIAGNOSIS — D472 Monoclonal gammopathy: Secondary | ICD-10-CM | POA: Diagnosis not present

## 2021-11-30 DIAGNOSIS — E8729 Other acidosis: Secondary | ICD-10-CM | POA: Diagnosis not present

## 2021-11-30 DIAGNOSIS — G929 Unspecified toxic encephalopathy: Secondary | ICD-10-CM | POA: Diagnosis not present

## 2021-11-30 DIAGNOSIS — M6281 Muscle weakness (generalized): Secondary | ICD-10-CM | POA: Diagnosis not present

## 2021-11-30 DIAGNOSIS — I2510 Atherosclerotic heart disease of native coronary artery without angina pectoris: Secondary | ICD-10-CM | POA: Diagnosis present

## 2021-11-30 DIAGNOSIS — R262 Difficulty in walking, not elsewhere classified: Secondary | ICD-10-CM | POA: Diagnosis not present

## 2021-11-30 DIAGNOSIS — G319 Degenerative disease of nervous system, unspecified: Secondary | ICD-10-CM | POA: Diagnosis not present

## 2021-11-30 DIAGNOSIS — Z8249 Family history of ischemic heart disease and other diseases of the circulatory system: Secondary | ICD-10-CM | POA: Diagnosis not present

## 2021-11-30 DIAGNOSIS — Z20822 Contact with and (suspected) exposure to covid-19: Secondary | ICD-10-CM | POA: Diagnosis not present

## 2021-11-30 DIAGNOSIS — Z6838 Body mass index (BMI) 38.0-38.9, adult: Secondary | ICD-10-CM

## 2021-11-30 DIAGNOSIS — E8809 Other disorders of plasma-protein metabolism, not elsewhere classified: Secondary | ICD-10-CM | POA: Diagnosis present

## 2021-11-30 DIAGNOSIS — I129 Hypertensive chronic kidney disease with stage 1 through stage 4 chronic kidney disease, or unspecified chronic kidney disease: Secondary | ICD-10-CM | POA: Diagnosis not present

## 2021-11-30 DIAGNOSIS — E860 Dehydration: Secondary | ICD-10-CM | POA: Diagnosis present

## 2021-11-30 DIAGNOSIS — Z833 Family history of diabetes mellitus: Secondary | ICD-10-CM | POA: Diagnosis not present

## 2021-11-30 DIAGNOSIS — R404 Transient alteration of awareness: Secondary | ICD-10-CM | POA: Diagnosis not present

## 2021-11-30 DIAGNOSIS — R338 Other retention of urine: Secondary | ICD-10-CM | POA: Diagnosis present

## 2021-11-30 DIAGNOSIS — T8131XA Disruption of external operation (surgical) wound, not elsewhere classified, initial encounter: Secondary | ICD-10-CM | POA: Diagnosis present

## 2021-11-30 DIAGNOSIS — Y848 Other medical procedures as the cause of abnormal reaction of the patient, or of later complication, without mention of misadventure at the time of the procedure: Secondary | ICD-10-CM | POA: Diagnosis present

## 2021-11-30 DIAGNOSIS — R279 Unspecified lack of coordination: Secondary | ICD-10-CM | POA: Diagnosis not present

## 2021-11-30 DIAGNOSIS — F01B2 Vascular dementia, moderate, with psychotic disturbance: Secondary | ICD-10-CM | POA: Diagnosis not present

## 2021-11-30 DIAGNOSIS — E1169 Type 2 diabetes mellitus with other specified complication: Secondary | ICD-10-CM | POA: Diagnosis present

## 2021-11-30 DIAGNOSIS — D539 Nutritional anemia, unspecified: Secondary | ICD-10-CM | POA: Diagnosis not present

## 2021-11-30 DIAGNOSIS — R0902 Hypoxemia: Secondary | ICD-10-CM | POA: Diagnosis not present

## 2021-11-30 DIAGNOSIS — F05 Delirium due to known physiological condition: Secondary | ICD-10-CM | POA: Diagnosis not present

## 2021-11-30 DIAGNOSIS — Z955 Presence of coronary angioplasty implant and graft: Secondary | ICD-10-CM | POA: Diagnosis not present

## 2021-11-30 DIAGNOSIS — E669 Obesity, unspecified: Secondary | ICD-10-CM | POA: Diagnosis present

## 2021-11-30 DIAGNOSIS — I252 Old myocardial infarction: Secondary | ICD-10-CM | POA: Diagnosis not present

## 2021-11-30 DIAGNOSIS — I10 Essential (primary) hypertension: Secondary | ICD-10-CM | POA: Diagnosis not present

## 2021-11-30 DIAGNOSIS — Z79899 Other long term (current) drug therapy: Secondary | ICD-10-CM

## 2021-11-30 DIAGNOSIS — E876 Hypokalemia: Secondary | ICD-10-CM | POA: Diagnosis not present

## 2021-11-30 DIAGNOSIS — E870 Hyperosmolality and hypernatremia: Principal | ICD-10-CM | POA: Diagnosis present

## 2021-11-30 DIAGNOSIS — Z87891 Personal history of nicotine dependence: Secondary | ICD-10-CM

## 2021-11-30 DIAGNOSIS — R4182 Altered mental status, unspecified: Secondary | ICD-10-CM | POA: Diagnosis present

## 2021-11-30 DIAGNOSIS — R531 Weakness: Secondary | ICD-10-CM | POA: Diagnosis not present

## 2021-11-30 DIAGNOSIS — N179 Acute kidney failure, unspecified: Secondary | ICD-10-CM | POA: Diagnosis present

## 2021-11-30 DIAGNOSIS — M6259 Muscle wasting and atrophy, not elsewhere classified, multiple sites: Secondary | ICD-10-CM | POA: Diagnosis not present

## 2021-11-30 DIAGNOSIS — G9340 Encephalopathy, unspecified: Principal | ICD-10-CM

## 2021-11-30 DIAGNOSIS — N5089 Other specified disorders of the male genital organs: Secondary | ICD-10-CM | POA: Diagnosis present

## 2021-11-30 DIAGNOSIS — R278 Other lack of coordination: Secondary | ICD-10-CM | POA: Diagnosis not present

## 2021-11-30 DIAGNOSIS — F0670 Mild neurocognitive disorder due to known physiological condition without behavioral disturbance: Secondary | ICD-10-CM | POA: Diagnosis not present

## 2021-11-30 DIAGNOSIS — R29818 Other symptoms and signs involving the nervous system: Secondary | ICD-10-CM | POA: Diagnosis not present

## 2021-11-30 DIAGNOSIS — N3289 Other specified disorders of bladder: Secondary | ICD-10-CM | POA: Diagnosis present

## 2021-11-30 DIAGNOSIS — I6782 Cerebral ischemia: Secondary | ICD-10-CM | POA: Diagnosis not present

## 2021-11-30 DIAGNOSIS — R339 Retention of urine, unspecified: Secondary | ICD-10-CM

## 2021-11-30 DIAGNOSIS — E559 Vitamin D deficiency, unspecified: Secondary | ICD-10-CM | POA: Diagnosis not present

## 2021-11-30 DIAGNOSIS — Z7982 Long term (current) use of aspirin: Secondary | ICD-10-CM

## 2021-11-30 DIAGNOSIS — Z743 Need for continuous supervision: Secondary | ICD-10-CM | POA: Diagnosis not present

## 2021-11-30 LAB — CBC WITH DIFFERENTIAL/PLATELET
Abs Immature Granulocytes: 0.03 10*3/uL (ref 0.00–0.07)
Basophils Absolute: 0 10*3/uL (ref 0.0–0.1)
Basophils Relative: 0 %
Eosinophils Absolute: 0.1 10*3/uL (ref 0.0–0.5)
Eosinophils Relative: 1 %
HCT: 28.1 % — ABNORMAL LOW (ref 39.0–52.0)
Hemoglobin: 8.9 g/dL — ABNORMAL LOW (ref 13.0–17.0)
Immature Granulocytes: 0 %
Lymphocytes Relative: 6 %
Lymphs Abs: 0.5 10*3/uL — ABNORMAL LOW (ref 0.7–4.0)
MCH: 34.8 pg — ABNORMAL HIGH (ref 26.0–34.0)
MCHC: 31.7 g/dL (ref 30.0–36.0)
MCV: 109.8 fL — ABNORMAL HIGH (ref 80.0–100.0)
Monocytes Absolute: 0.8 10*3/uL (ref 0.1–1.0)
Monocytes Relative: 10 %
Neutro Abs: 7 10*3/uL (ref 1.7–7.7)
Neutrophils Relative %: 83 %
Platelets: 130 10*3/uL — ABNORMAL LOW (ref 150–400)
RBC: 2.56 MIL/uL — ABNORMAL LOW (ref 4.22–5.81)
RDW: 22.3 % — ABNORMAL HIGH (ref 11.5–15.5)
WBC: 8.4 10*3/uL (ref 4.0–10.5)
nRBC: 0.9 % — ABNORMAL HIGH (ref 0.0–0.2)

## 2021-11-30 LAB — APTT: aPTT: 37 seconds — ABNORMAL HIGH (ref 24–36)

## 2021-11-30 LAB — COMPREHENSIVE METABOLIC PANEL
ALT: 83 U/L — ABNORMAL HIGH (ref 0–44)
AST: 74 U/L — ABNORMAL HIGH (ref 15–41)
Albumin: 3 g/dL — ABNORMAL LOW (ref 3.5–5.0)
Alkaline Phosphatase: 81 U/L (ref 38–126)
Anion gap: 9 (ref 5–15)
BUN: 34 mg/dL — ABNORMAL HIGH (ref 8–23)
CO2: 20 mmol/L — ABNORMAL LOW (ref 22–32)
Calcium: 8.8 mg/dL — ABNORMAL LOW (ref 8.9–10.3)
Chloride: 118 mmol/L — ABNORMAL HIGH (ref 98–111)
Creatinine, Ser: 1.81 mg/dL — ABNORMAL HIGH (ref 0.61–1.24)
GFR, Estimated: 35 mL/min — ABNORMAL LOW (ref >60)
Glucose, Bld: 96 mg/dL (ref 70–99)
Potassium: 3.8 mmol/L (ref 3.5–5.1)
Sodium: 147 mmol/L — ABNORMAL HIGH (ref 135–145)
Total Bilirubin: 2 mg/dL — ABNORMAL HIGH (ref 0.3–1.2)
Total Protein: 7.1 g/dL (ref 6.5–8.1)

## 2021-11-30 LAB — URINALYSIS, ROUTINE W REFLEX MICROSCOPIC
Bacteria, UA: NONE SEEN
Bilirubin Urine: NEGATIVE
Glucose, UA: NEGATIVE mg/dL
Ketones, ur: 5 mg/dL — AB
Leukocytes,Ua: NEGATIVE
Nitrite: NEGATIVE
Protein, ur: 30 mg/dL — AB
Specific Gravity, Urine: 1.017 (ref 1.005–1.030)
pH: 5 (ref 5.0–8.0)

## 2021-11-30 LAB — AMMONIA: Ammonia: 22 umol/L (ref 9–35)

## 2021-11-30 LAB — PROTIME-INR
INR: 1.5 — ABNORMAL HIGH (ref 0.8–1.2)
Prothrombin Time: 17.7 seconds — ABNORMAL HIGH (ref 11.4–15.2)

## 2021-11-30 LAB — LACTIC ACID, PLASMA: Lactic Acid, Venous: 2 mmol/L (ref 0.5–1.9)

## 2021-11-30 LAB — RESP PANEL BY RT-PCR (FLU A&B, COVID) ARPGX2
Influenza A by PCR: NEGATIVE
Influenza B by PCR: NEGATIVE
SARS Coronavirus 2 by RT PCR: NEGATIVE

## 2021-11-30 MED ORDER — NALOXONE HCL 0.4 MG/ML IJ SOLN
0.4000 mg | Freq: Once | INTRAMUSCULAR | Status: AC
  Administered 2021-11-30: 0.4 mg via INTRAVENOUS
  Filled 2021-11-30: qty 1

## 2021-11-30 MED ORDER — HEPARIN SODIUM (PORCINE) 5000 UNIT/ML IJ SOLN
5000.0000 [IU] | Freq: Three times a day (TID) | INTRAMUSCULAR | Status: DC
  Administered 2021-11-30 – 2021-12-12 (×36): 5000 [IU] via SUBCUTANEOUS
  Filled 2021-11-30 (×36): qty 1

## 2021-11-30 MED ORDER — CARVEDILOL 6.25 MG PO TABS
6.2500 mg | ORAL_TABLET | Freq: Two times a day (BID) | ORAL | Status: DC
  Administered 2021-11-30 – 2021-12-12 (×21): 6.25 mg via ORAL
  Filled 2021-11-30 (×24): qty 1

## 2021-11-30 MED ORDER — LACTATED RINGERS IV SOLN: INTRAVENOUS | Status: DC

## 2021-11-30 MED ORDER — VITAMIN B-12 1000 MCG PO TABS
1000.0000 ug | ORAL_TABLET | Freq: Every day | ORAL | Status: DC
  Administered 2021-12-01 – 2021-12-12 (×12): 1000 ug via ORAL
  Filled 2021-11-30 (×12): qty 1

## 2021-11-30 MED ORDER — SODIUM CHLORIDE 0.9 % IV SOLN
2.0000 g | INTRAVENOUS | Status: DC
  Administered 2021-11-30: 2 g via INTRAVENOUS
  Filled 2021-11-30: qty 20

## 2021-11-30 MED ORDER — HYDRALAZINE HCL 20 MG/ML IJ SOLN
5.0000 mg | INTRAMUSCULAR | Status: DC | PRN
  Administered 2021-11-30 – 2021-12-10 (×6): 5 mg via INTRAVENOUS
  Filled 2021-11-30 (×6): qty 1

## 2021-11-30 MED ORDER — AMLODIPINE BESYLATE 5 MG PO TABS
5.0000 mg | ORAL_TABLET | Freq: Every day | ORAL | Status: DC
  Administered 2021-11-30 – 2021-12-12 (×13): 5 mg via ORAL
  Filled 2021-11-30 (×13): qty 1

## 2021-11-30 MED ORDER — TAMSULOSIN HCL 0.4 MG PO CAPS
0.4000 mg | ORAL_CAPSULE | Freq: Every day | ORAL | Status: DC
  Administered 2021-12-01 – 2021-12-12 (×12): 0.4 mg via ORAL
  Filled 2021-11-30 (×12): qty 1

## 2021-11-30 MED ORDER — PRAVASTATIN SODIUM 40 MG PO TABS
40.0000 mg | ORAL_TABLET | Freq: Every day | ORAL | Status: DC
  Administered 2021-12-01: 40 mg via ORAL
  Filled 2021-11-30: qty 1

## 2021-11-30 MED ORDER — ASPIRIN 81 MG PO TBEC
81.0000 mg | DELAYED_RELEASE_TABLET | Freq: Every day | ORAL | Status: DC
  Administered 2021-12-01 – 2021-12-12 (×12): 81 mg via ORAL
  Filled 2021-11-30 (×12): qty 1

## 2021-11-30 MED ORDER — VITAMIN D 25 MCG (1000 UNIT) PO TABS
1000.0000 [IU] | ORAL_TABLET | Freq: Every day | ORAL | Status: DC
  Administered 2021-12-01 – 2021-12-12 (×12): 1000 [IU] via ORAL
  Filled 2021-11-30 (×13): qty 1

## 2021-11-30 MED ORDER — ACETAMINOPHEN 650 MG RE SUPP: 650.0000 mg | Freq: Four times a day (QID) | RECTAL | Status: DC | PRN

## 2021-11-30 MED ORDER — ACETAMINOPHEN 325 MG PO TABS
650.0000 mg | ORAL_TABLET | Freq: Four times a day (QID) | ORAL | Status: DC | PRN
  Administered 2021-12-03 – 2021-12-07 (×4): 650 mg via ORAL
  Filled 2021-11-30 (×4): qty 2

## 2021-11-30 MED ORDER — LATANOPROST 0.005 % OP SOLN
1.0000 [drp] | Freq: Every day | OPHTHALMIC | Status: DC
  Administered 2021-12-01 – 2021-12-11 (×11): 1 [drp] via OPHTHALMIC
  Filled 2021-11-30: qty 2.5

## 2021-11-30 NOTE — ED Notes (Signed)
Pt is hypertensive 862 systolic. Dr Waldron Labs informed for further instructions. Terrance Miller

## 2021-11-30 NOTE — ED Notes (Signed)
Report given to [facility] with [place]. Bryson Corona Edd Fabian

## 2021-11-30 NOTE — Sepsis Progress Note (Signed)
[facility] following code sepsis °

## 2021-11-30 NOTE — ED Triage Notes (Signed)
Pt arrives via [place] from home for altered mental status. Pt's family reported to EMS that pt recently had surgery for hydrocele and was discharged home. The day before thanksgiving pt had decline in status and became unable to walk and converse normally.

## 2021-11-30 NOTE — ED Notes (Signed)
Date and time results received: 11/30/21 1430  (use smartphrase ".now" to insert current time)  Test: Lactic Acid  Critical Value: 2.0  Name of Provider Notified: Dr. Sabra Heck  Orders Received? Or Actions Taken?:  No new orders at this time

## 2021-11-30 NOTE — H&P (Signed)
[facility] H&P   Patient Demographics:    Terrance Miller, is a 88 y.o. male  MRN: [id number]   DOB - 02-09-1928  Admit Date - 11/30/2021  Outpatient Primary MD for the patient is Coral Spikes, DO  Referring MD/NP/PA: Dr Sabra Heck  Outpatient Specialists: Urology Dr Felipa Eth  Patient coming from: home  Chief Complaint  Patient presents with   Altered Mental Status      HPI:    Terrance Miller  is a 88 y.o. male,th medical history significant of coronary artery disease, diabetes mellitus without complication-diet controlled, hyperlipidemia, hypertension, with known bilateral hydroceles, status post bilateral hydrocelectomy by Dr. Felipa Eth 11/15. -Patient presents to ED with altered mental status, but at bedside, she provides most of the history, patient had surgery 11/15, follow-up 11/17, Penrose drain was left after surgery, and was discontinued recently by daughter, patient presents with 2 days of altered mental status, daughter reports usually he is ambulatory without assistance, talkative, feeds himself, but he was unable to get out of bed for the last 2 days, mumble things and able to answer questions appropriately, with very poor oral intake, with no urine output over last 24 hours. -In ED his sodium was 147, lactic acid 2, creatinine at baseline 1.8, bicarb of 20, AST of 74, ALT of 83, hemoglobin at baseline of 8.9, white blood cell 8.4, platelet at baseline of 130 K, ET head with no acute findings, ED physician discussed with urology on-call Dr. Milford Cage quested patient to be admitted to [facility], where he can have Foley catheter inserted there and postop evaluation by him to rule out any infection, patient received IV Rocephin while in ED empirically pending urology evaluation, [facility] hospitalist consulted to admit.    Review of systems:    Unable to obtain appropriate review of  systems due to altered mental status  With Past History of the following :    Past Medical History:  Diagnosis Date   Acute ST elevation myocardial infarction (STEMI) of inferior wall ([place]) 2004   Coronary artery disease    DES x2 to the RCA 2004, residual disease managed medically   Hyperlipidemia    Hypertension    Type 2 diabetes mellitus ([place])       Past Surgical History:  Procedure Laterality Date   CATARACT EXTRACTION Bilateral    CORONARY ANGIOPLASTY WITH STENT PLACEMENT  09/05/2002   stent RCA, 80% first diagonal, 70-80% mid-diagonal, 80% mid LAD stenosis   HYDROCELE EXCISION Bilateral 11/20/2021   Procedure: HYDROCELECTOMY ADULT;  Surgeon: Primus Bravo., MD;  Location: [facility];  Service: Urology;  Laterality: Bilateral;   NM MYOCAR PERF WALL MOTION  11/01/2008   Normal      Social History:     Social History   Tobacco Use   Smoking status: Former    Packs/day: 0.25    Types: Cigarettes    Quit  date: 11/18/2005    Years since quitting: 16.0   Smokeless tobacco: Never  Substance Use Topics   Alcohol use: Not Currently        Family History :     Family History  Problem Relation Age of Onset   Hypertension Father    Diabetes Sister       Home Medications:   Prior to Admission medications   Medication Sig Start Date End Date Taking? Authorizing Provider  aspirin EC 81 MG tablet Take 1 tablet (81 mg total) by mouth daily. Swallow whole. 09/13/21  Yes Donato Heinz, MD  carvedilol (COREG) 6.25 MG tablet Take 1 tablet (6.25 mg total) by mouth 2 (two) times daily. 09/13/21  Yes Donato Heinz, MD  cholecalciferol (VITAMIN D) 1000 units tablet Take 1,000 Units by mouth daily.   Yes [provider]  ciprofloxacin (CIPRO) 250 MG tablet Take 1 tablet (250 mg total) by mouth 2 (two) times daily for 7 days. 11/26/21 12/03/21 Yes Stoneking, Reece Leader., MD  clobetasol ointment (TEMOVATE) 0.05 % APPLY TO AFFECTED AREAS TWICE  DAILY. Patient taking differently: Apply 1 Application topically 2 (two) times daily. 11/25/21  Yes Cook, [name], DO  cyanocobalamin (VITAMIN B12) 1000 MCG tablet Take 1,000 mcg by mouth daily.   Yes [provider]  HYDROcodone-acetaminophen (NORCO/VICODIN) 5-325 MG tablet Take 1 tablet by mouth every 6 (six) hours as needed for moderate pain. 11/20/21  Yes Stoneking, Reece Leader., MD  meclizine (ANTIVERT) 25 MG tablet Take 25 mg by mouth daily as needed for dizziness.   Yes [provider]  pravastatin (PRAVACHOL) 40 MG tablet Take 1 tablet (40 mg total) by mouth daily. 04/29/21  Yes Cook, [name], DO  QUEtiapine (SEROQUEL) 25 MG tablet Take 25 mg by mouth at bedtime.   Yes [provider]  tamsulosin (FLOMAX) 0.4 MG CAPS capsule Take 0.4 mg by mouth daily.   Yes [provider]  TRAVATAN Z 0.004 % SOLN ophthalmic solution Place 1 drop into both eyes 2 (two) times daily. 04/13/17  Yes [provider]     Allergies:    No Known Allergies   Physical Exam:   Vitals  Blood pressure (!) 175/84, pulse (!) 57, temperature 98.2 F (36.8 C), temperature source Oral, resp. rate 15, SpO2 100 %.   1. General frail, chronically ill-appearing male, laying in bed, no apparent distress  2.  Sleepy, but wakes up, answering yes/no questions appropriately, oriented x 2  3. No F.N deficits, ALL C.Nerves Intact, Strength 5/5 all 4 extremities, Sensation intact all 4 extremities, Plantars down going.  4. Ears and Eyes appear Normal, Conjunctivae clear, PERRLA. Moist Oral Mucosa.  5. Supple Neck, No JVD, No cervical lymphadenopathy appriciated, No Carotid Bruits.  6. Symmetrical Chest wall movement, Good air movement bilaterally, CTAB.  7. RRR, No Gallops, Rubs or Murmurs, No Parasternal Heave.  8. Positive Bowel Sounds, Abdomen Soft, No tenderness, No organomegaly appriciated,No rebound -guarding or rigidity.  9.  No Cyanosis, Normal Skin Turgor, right  lower extremity skin changes  10. Good muscle tone,  joints appear normal , no effusions, Normal ROM.  Scrotal exam, please see picture below, doubly enlarged scrotum, with hydrocele, patient with a midline posterior surgical wound, no oozing or drainage noted     Data Review:    CBC Recent Labs  Lab 11/30/21 1557  WBC 8.4  HGB 8.9*  HCT 28.1*  PLT 130*  MCV 109.8*  MCH 34.8*  MCHC  31.7  RDW 22.3*  LYMPHSABS 0.5*  MONOABS 0.8  EOSABS 0.1  BASOSABS 0.0   ------------------------------------------------------------------------------------------------------------------  Chemistries  Recent Labs  Lab 11/30/21 1557  NA 147*  K 3.8  CL 118*  CO2 20*  GLUCOSE 96  BUN 34*  CREATININE 1.81*  CALCIUM 8.8*  AST 74*  ALT 83*  ALKPHOS 81  BILITOT 2.0*   ------------------------------------------------------------------------------------------------------------------ estimated creatinine clearance is 29.7 mL/min (A) (by C-G formula based on SCr of 1.81 mg/dL (H)). ------------------------------------------------------------------------------------------------------------------ No results for input(s): "TSH", "T4TOTAL", "T3FREE", "THYROIDAB" in the last 72 hours.  Invalid input(s): "FREET3"  Coagulation profile Recent Labs  Lab 11/30/21 1557  INR 1.5*   ------------------------------------------------------------------------------------------------------------------- No results for input(s): "DDIMER" in the last 72 hours. -------------------------------------------------------------------------------------------------------------------  Cardiac Enzymes No results for input(s): "CKMB", "TROPONINI", "MYOGLOBIN" in the last 168 hours.  Invalid input(s): "CK" ------------------------------------------------------------------------------------------------------------------ No results found for:  "BNP"   ---------------------------------------------------------------------------------------------------------------  Urinalysis    Component Value Date/Time   COLORURINE YELLOW 07/27/2021 [place] 07/27/2021 1653   APPEARANCEUR Clear 07/16/2021 1132   LABSPEC 1.015 07/27/2021 1653   PHURINE 5.0 07/27/2021 1653   GLUCOSEU NEGATIVE 07/27/2021 1653   HGBUR NEGATIVE 07/27/2021 1653   BILIRUBINUR NEGATIVE 07/27/2021 1653   BILIRUBINUR Negative 07/16/2021 1132   KETONESUR NEGATIVE 07/27/2021 1653   PROTEINUR NEGATIVE 07/27/2021 1653   NITRITE NEGATIVE 07/27/2021 1653   LEUKOCYTESUR NEGATIVE 07/27/2021 1653    ----------------------------------------------------------------------------------------------------------------   Imaging Results:    DG Chest Port 1 View  Result Date: 11/30/2021 CLINICAL DATA:  Altered mental status EXAM: PORTABLE CHEST 1 VIEW COMPARISON:  Chest x-ray dated July 31, 2021 FINDINGS: Cardiac and mediastinal contours are unchanged when accounting for patient rightward rotation. Mild bilateral heterogeneous opacities. More focal opacity of the right lung base no large pleural effusion. No evidence of pneumothorax. IMPRESSION: 1. Mild bilateral heterogeneous opacities, likely due to pulmonary edema. 2. More focal opacity of the at the right lung base is likely due to atelectasis. Electronically Signed   By: Yetta Glassman M.D.   On: 11/30/2021 16:15     EKG MyVent. rate 66 BPM PR interval 163 ms QRS duration 81 ms QT/QTcB 434/455 ms P-R-T axes 13 42 -61 Sinus rhythm Ventricular premature complex Borderline low voltage, extremity leads Repol abnrm suggests ischemia, lateral leads since last tracing no significant change    Assessment & Plan:    Principal Problem:   AMS (altered mental status) Active Problems:   Hypertension   Hyperlipidemia LDL goal <70   CAD (coronary artery disease)   Hydrocele   Stage 3b chronic kidney  disease (HCC)   MGUS (monoclonal gammopathy of unknown significance)    AMS Acute metabolic encephalopathy -Most likely in the setting of hyponatremia, dehydration, and possible infection -CT head with no acute intracranial process identified -Continue with IV fluids. -Continue to hold Seroquel -Is most likely due to some underlying infectious process , so far cannot obtain urine analysis.  With recent surgery, please see discussion below  B/L large hydrocele This post recent bilateral hydrocelectomy by Dr Primus Bravo 11/15 Urinary Retention -Cannot rule out underlying infectious process, will need urology evaluation, ED physician discussed with Dr. Milford Cage who requested transfer to [facility], for Foley catheter insertion and urology evaluation. -Continue on Ceftin meanwhile.  Hypernatremia -Continue with IV fluids  Transaminitis -Likely due to above, continue to trend   CKD stage IIIb -Continue with IV fluids, avoid nephrotoxic medications  Hyperlipidemia LDL goal <70 - continue  with statin   Hypertension -blood pressure  is elevated, continue with home metoprolol and amlodipine, will add as needed hydralazine -Stable overall -Will continue the use of metoprolol and amlodipine. -Continue to follow-up vital signs.  -Heart healthy diet discussed with patient and family at bedside.   obesity -With BMI of 32     DVT Prophylaxis Heparin  AM Labs Ordered, also please review Full Orders  Family Communication: Admission, patients condition and plan of care including tests being ordered have been discussed with the patient and daughter at bedside who indicate understanding and agree with the plan and Code Status.  Code Status full  Likely DC to  home  Condition GUARDED    Consults called: urology dr Milford Cage by ED    Admission status: inpatient    Time spent in minutes : 70 minutes   Phillips Climes M.D on 11/30/2021 at 6:47 PM   [facility] - Office  [phone]

## 2021-11-30 NOTE — ED Notes (Signed)
Patient transported to CT 

## 2021-11-30 NOTE — Consult Note (Signed)
Urology Consult   Physician requesting consult: Elgergawy  Reason for consult: Penile and scrotal edema with urinary retention  History of Present Illness: Terrance Miller is a 88 y.o. African-American male with history of very large bilateral hydroceles.  Underwent bilateral hydrocelectomy by Dr. Felipa Eth on 11/20/2021.  Had Penrose drains removed on 11/22/2021.  Patient has developed significant postoperative scrotal and penile edema which made urination difficult.  He presented to the [facility] emergency room earlier today obtunded after being brought by EMS for being poorly responsive at home.  At [facility] noted to have significant penile scrotal edema and unable to pass Foley catheter due to the edema.  He was transferred here for further evaluation and management of possible urinary retention with an context of massive scrotal and penile edema after hydrocelectomy.  Foley placement: Patient had significant penile and scrotal edema which made visualization of the meatus and possible.  I was able to insert index finger through the penile edema and I was able to feel the glans.  I was unsuccessful in trying to negotiate a catheter initially under finger guidance.  Subsequently utilized a sensor wire under finger guidance was able to get this into the meatus and advanced into the bladder.  I then passed a 71 Teacher, early years/pre over the guidewire with return of some turbid urine.  10 cc placed in the balloon and placed to gravity drain.  Approximately 300 cc of urine obtained.  Past Medical History:  Diagnosis Date   Acute ST elevation myocardial infarction (STEMI) of inferior wall ([place]) 2004   Coronary artery disease    DES x2 to the RCA 2004, residual disease managed medically   Hyperlipidemia    Hypertension    Type 2 diabetes mellitus ([place])     Past Surgical History:  Procedure Laterality Date   CATARACT EXTRACTION Bilateral    CORONARY ANGIOPLASTY WITH STENT PLACEMENT  09/05/2002    stent RCA, 80% first diagonal, 70-80% mid-diagonal, 80% mid LAD stenosis   HYDROCELE EXCISION Bilateral 11/20/2021   Procedure: HYDROCELECTOMY ADULT;  Surgeon: Primus Bravo., MD;  Location: [facility];  Service: Urology;  Laterality: Bilateral;   NM MYOCAR PERF WALL MOTION  11/01/2008   Normal    Current Hospital Medications:  Home Meds:  No current facility-administered medications on file prior to encounter.   Current Outpatient Medications on File Prior to Encounter  Medication Sig Dispense Refill   aspirin EC 81 MG tablet Take 1 tablet (81 mg total) by mouth daily. Swallow whole. 90 tablet 3   carvedilol (COREG) 6.25 MG tablet Take 1 tablet (6.25 mg total) by mouth 2 (two) times daily. 180 tablet 3   cholecalciferol (VITAMIN D) 1000 units tablet Take 1,000 Units by mouth daily.     ciprofloxacin (CIPRO) 250 MG tablet Take 1 tablet (250 mg total) by mouth 2 (two) times daily for 7 days. 14 tablet 0   clobetasol ointment (TEMOVATE) 0.05 % APPLY TO AFFECTED AREAS TWICE DAILY. (Patient taking differently: Apply 1 Application topically 2 (two) times daily.) 30 g 0   cyanocobalamin (VITAMIN B12) 1000 MCG tablet Take 1,000 mcg by mouth daily.     HYDROcodone-acetaminophen (NORCO/VICODIN) 5-325 MG tablet Take 1 tablet by mouth every 6 (six) hours as needed for moderate pain. 10 tablet 0   meclizine (ANTIVERT) 25 MG tablet Take 25 mg by mouth daily as needed for dizziness.     pravastatin (PRAVACHOL) 40 MG tablet Take 1 tablet (40 mg total) by mouth  daily. 90 tablet 3   QUEtiapine (SEROQUEL) 25 MG tablet Take 25 mg by mouth at bedtime.     tamsulosin (FLOMAX) 0.4 MG CAPS capsule Take 0.4 mg by mouth daily.     TRAVATAN Z 0.004 % SOLN ophthalmic solution Place 1 drop into both eyes 2 (two) times daily.       Scheduled Meds:  amLODipine  5 mg Oral Daily   carvedilol  6.25 mg Oral BID   latanoprost  1 drop Both Eyes QHS   Continuous Infusions:  cefTRIAXone (ROCEPHIN)  IV Stopped  (11/30/21 1642)   lactated ringers 150 mL/hr at 11/30/21 2057   PRN Meds:.hydrALAZINE  Allergies: No Known Allergies  Family History  Problem Relation Age of Onset   Hypertension Father    Diabetes Sister     Social History:  reports that he quit smoking about 16 years ago. His smoking use included cigarettes. He smoked an average of .25 packs per day. He has never used smokeless tobacco. He reports that he does not currently use alcohol. He reports that he does not use drugs.  ROS: A complete review of systems was performed.  All systems are negative except for pertinent findings as noted.  Physical Exam:  Vital signs in last 24 hours: Temp:  [97.8 F (36.6 C)-98.8 F (37.1 C)] 97.8 F (36.6 C) (11/25 2101) Pulse Rate:  [57-74] 61 (11/25 2101) Resp:  [9-17] 17 (11/25 2101) BP: (160-187)/(80-94) 166/87 (11/25 2101) SpO2:  [95 %-100 %] 100 % (11/25 2101) Weight:  [108.9 kg] 108.9 kg (11/25 1925) Constitutional: Patient somewhat obtunded.  Did not answer questions GU: Significant penile and scrotal edema.  Scrotal incision sites look good without drainage or evidence of infection Lymphatic: No lymphadenopathy    Laboratory Data:  Recent Labs    11/30/21 1557  WBC 8.4  HGB 8.9*  HCT 28.1*  PLT 130*    Recent Labs    11/30/21 1557  NA 147*  K 3.8  CL 118*  GLUCOSE 96  BUN 34*  CALCIUM 8.8*  CREATININE 1.81*     Results for orders placed or performed during the hospital encounter of 11/30/21 (from the past 24 hour(s))  Lactic acid, plasma     Status: Abnormal   Collection Time: 11/30/21  3:46 PM  Result Value Ref Range   Lactic Acid, Venous 2.0 (HH) 0.5 - 1.9 mmol/L  Comprehensive metabolic panel     Status: Abnormal   Collection Time: 11/30/21  3:57 PM  Result Value Ref Range   Sodium 147 (H) 135 - 145 mmol/L   Potassium 3.8 3.5 - 5.1 mmol/L   Chloride 118 (H) 98 - 111 mmol/L   CO2 20 (L) 22 - 32 mmol/L   Glucose, Bld 96 70 - 99 mg/dL   BUN 34 (H) 8  - 23 mg/dL   Creatinine, Ser 1.81 (H) 0.61 - 1.24 mg/dL   Calcium 8.8 (L) 8.9 - 10.3 mg/dL   Total Protein 7.1 6.5 - 8.1 g/dL   Albumin 3.0 (L) 3.5 - 5.0 g/dL   AST 74 (H) 15 - 41 U/L   ALT 83 (H) 0 - 44 U/L   Alkaline Phosphatase 81 38 - 126 U/L   Total Bilirubin 2.0 (H) 0.3 - 1.2 mg/dL   GFR, Estimated 35 (L) >60 mL/min   Anion gap 9 5 - 15  CBC with Differential     Status: Abnormal   Collection Time: 11/30/21  3:57 PM  Result Value Ref Range  WBC 8.4 4.0 - 10.5 K/uL   RBC 2.56 (L) 4.22 - 5.81 MIL/uL   Hemoglobin 8.9 (L) 13.0 - 17.0 g/dL   HCT 28.1 (L) 39.0 - 52.0 %   MCV 109.8 (H) 80.0 - 100.0 fL   MCH 34.8 (H) 26.0 - 34.0 pg   MCHC 31.7 30.0 - 36.0 g/dL   RDW 22.3 (H) 11.5 - 15.5 %   Platelets 130 (L) 150 - 400 K/uL   nRBC 0.9 (H) 0.0 - 0.2 %   Neutrophils Relative % 83 %   Neutro Abs 7.0 1.7 - 7.7 K/uL   Lymphocytes Relative 6 %   Lymphs Abs 0.5 (L) 0.7 - 4.0 K/uL   Monocytes Relative 10 %   Monocytes Absolute 0.8 0.1 - 1.0 K/uL   Eosinophils Relative 1 %   Eosinophils Absolute 0.1 0.0 - 0.5 K/uL   Basophils Relative 0 %   Basophils Absolute 0.0 0.0 - 0.1 K/uL   Immature Granulocytes 0 %   Abs Immature Granulocytes 0.03 0.00 - 0.07 K/uL  Protime-INR     Status: Abnormal   Collection Time: 11/30/21  3:57 PM  Result Value Ref Range   Prothrombin Time 17.7 (H) 11.4 - 15.2 seconds   INR 1.5 (H) 0.8 - 1.2  APTT     Status: Abnormal   Collection Time: 11/30/21  3:57 PM  Result Value Ref Range   aPTT 37 (H) 24 - 36 seconds  Blood Culture (routine x 2)     Status: None (Preliminary result)   Collection Time: 11/30/21  3:57 PM   Specimen: BLOOD LEFT FOREARM  Result Value Ref Range   Specimen Description      BLOOD LEFT FOREARM BOTTLES DRAWN AEROBIC AND ANAEROBIC   Special Requests      Blood Culture adequate volume Performed at [facility], [address], [place], [place] [address]    Culture PENDING    Report Status PENDING   Ammonia     Status: None    Collection Time: 11/30/21  3:57 PM  Result Value Ref Range   Ammonia 22 9 - 35 umol/L  Resp Panel by RT-PCR (Flu A&B, Covid) Anterior Nasal Swab     Status: None   Collection Time: 11/30/21  4:13 PM   Specimen: Anterior Nasal Swab  Result Value Ref Range   SARS Coronavirus 2 by RT PCR NEGATIVE NEGATIVE   Influenza A by PCR NEGATIVE NEGATIVE   Influenza B by PCR NEGATIVE NEGATIVE  Blood Culture (routine x 2)     Status: None (Preliminary result)   Collection Time: 11/30/21  4:24 PM   Specimen: Right Antecubital; Blood  Result Value Ref Range   Specimen Description      RIGHT ANTECUBITAL BOTTLES DRAWN AEROBIC AND ANAEROBIC   Special Requests      Blood Culture adequate volume Performed at [facility], [address], [place], [place] [address]    Culture PENDING    Report Status PENDING    Recent Results (from the past 240 hour(s))  Blood Culture (routine x 2)     Status: None (Preliminary result)   Collection Time: 11/30/21  3:57 PM   Specimen: BLOOD LEFT FOREARM  Result Value Ref Range Status   Specimen Description   Final    BLOOD LEFT FOREARM BOTTLES DRAWN AEROBIC AND ANAEROBIC   Special Requests   Final    Blood Culture adequate volume Performed at [facility], [address], [place], [place] [address]  Culture PENDING  Incomplete   Report Status PENDING  Incomplete  Resp Panel by RT-PCR (Flu A&B, Covid) Anterior Nasal Swab     Status: None   Collection Time: 11/30/21  4:13 PM   Specimen: Anterior Nasal Swab  Result Value Ref Range Status   SARS Coronavirus 2 by RT PCR NEGATIVE NEGATIVE Final    Comment: (NOTE) SARS-CoV-2 target nucleic acids are NOT DETECTED.  The SARS-CoV-2 RNA is generally detectable in upper respiratory specimens during the acute phase of infection. The lowest concentration of SARS-CoV-2 viral copies this assay can detect is 138 copies/mL. A negative result does not preclude SARS-Cov-2 infection and should not be used as the sole basis for  treatment or other patient management decisions. A negative result may occur with  improper specimen collection/handling, submission of specimen other than nasopharyngeal swab, presence of viral mutation(s) within the areas targeted by this assay, and inadequate number of viral copies(<138 copies/mL). A negative result must be combined with clinical observations, patient history, and epidemiological information. The expected result is Negative.  Fact Sheet for Patients:  EntrepreneurPulse.com.au  Fact Sheet for Healthcare Providers:  IncredibleEmployment.be  This test is no t yet approved or cleared by the [place] FDA and  has been authorized for detection and/or diagnosis of SARS-CoV-2 by FDA under an Emergency Use Authorization (EUA). This EUA will remain  in effect (meaning this test can be used) for the duration of the COVID-19 declaration under Section 564(b)(1) of the Act, 21 U.S.C.section 360bbb-3(b)(1), unless the authorization is terminated  or revoked sooner.       Influenza A by PCR NEGATIVE NEGATIVE Final   Influenza B by PCR NEGATIVE NEGATIVE Final    Comment: (NOTE) The Xpert Xpress SARS-CoV-2/FLU/RSV plus assay is intended as an aid in the diagnosis of influenza from Nasopharyngeal swab specimens and should not be used as a sole basis for treatment. Nasal washings and aspirates are unacceptable for Xpert Xpress SARS-CoV-2/FLU/RSV testing.  Fact Sheet for Patients: EntrepreneurPulse.com.au  Fact Sheet for Healthcare Providers: IncredibleEmployment.be  This test is not yet approved or cleared by the [place] FDA and has been authorized for detection and/or diagnosis of SARS-CoV-2 by FDA under an Emergency Use Authorization (EUA). This EUA will remain in effect (meaning this test can be used) for the duration of the COVID-19 declaration under Section 564(b)(1) of the Act, 21  U.S.C. section 360bbb-3(b)(1), unless the authorization is terminated or revoked.  Performed at [facility], [address], [place], [place] [address]   Blood Culture (routine x 2)     Status: None (Preliminary result)   Collection Time: 11/30/21  4:24 PM   Specimen: Right Antecubital; Blood  Result Value Ref Range Status   Specimen Description   Final    RIGHT ANTECUBITAL BOTTLES DRAWN AEROBIC AND ANAEROBIC   Special Requests   Final    Blood Culture adequate volume Performed at [facility], [address] [place], [place] [address]    Culture PENDING  Incomplete   Report Status PENDING  Incomplete    Renal Function: Recent Labs    11/30/21 1557  CREATININE 1.81*   Estimated Creatinine Clearance: 30.8 mL/min (A) (by C-G formula based on SCr of 1.81 mg/dL (H)).  Radiologic Imaging: DG Chest Port 1 View  Result Date: 11/30/2021 CLINICAL DATA:  Altered mental status EXAM: PORTABLE CHEST 1 VIEW COMPARISON:  Chest x-ray dated July 31, 2021 FINDINGS: Cardiac and mediastinal contours are unchanged when accounting for patient rightward rotation. Mild  bilateral heterogeneous opacities. More focal opacity of the right lung base no large pleural effusion. No evidence of pneumothorax. IMPRESSION: 1. Mild bilateral heterogeneous opacities, likely due to pulmonary edema. 2. More focal opacity of the at the right lung base is likely due to atelectasis. Electronically Signed   By: Yetta Glassman M.D.   On: 11/30/2021 16:15    I independently reviewed the above imaging studies.  Impression/Recommendation 1.  Status post recent bilateral hydrocelectomy with recent altered mental status.  Patient having difficulty voiding due to the penile edema, now status post Foley placement. Plan/recommendation: Leave Foley catheter until penile edema resolves. Continue to evaluate for altered mental status and failure to thrive. Will send urine culture from Foley catheter.  Terrance Miller 11/30/2021, 9:46 PM    Remi Haggard, MD   CC: Dr Felipa Eth

## 2021-11-30 NOTE — ED Provider Notes (Addendum)
[facility] EMERGENCY DEPARTMENT Provider Note   CSN: [id number] Arrival date & time: 11/30/21  1521     History  Chief Complaint  Patient presents with   Altered Mental Status    Terrance Miller is a 88 y.o. male.   Altered Mental Status  This patient is a 88 year old male, he has a history of recent bilateral hydrocele surgery, this occurred approximately 10 days ago.  He has been at home with family, had a follow-up on 17 November, he had Penrose drains that were left in place bilaterally at that time.  At that time it was noted that his scrotum was edematous.  Unfortunately the patient has had about 2 days of altered mental status, he has not gotten out of bed, he is able to mumble things but not able to answer questions, the patient's baseline is that he is able to walk and talk and communicate very well.  There is no reported fevers or vomiting, the family members stated that he was just getting get better because they were told that he might take a few days to get back to his normal self.  Further review of the medical record shows that in July 2023 he was admitted to the hospital with a metabolic encephalopathy and had an acute kidney injury.    Home Medications Prior to Admission medications   Medication Sig Start Date End Date Taking? Authorizing Provider  aspirin EC 81 MG tablet Take 1 tablet (81 mg total) by mouth daily. Swallow whole. 09/13/21  Yes Donato Heinz, MD  carvedilol (COREG) 6.25 MG tablet Take 1 tablet (6.25 mg total) by mouth 2 (two) times daily. 09/13/21  Yes Donato Heinz, MD  cholecalciferol (VITAMIN D) 1000 units tablet Take 1,000 Units by mouth daily.   Yes [provider]  ciprofloxacin (CIPRO) 250 MG tablet Take 1 tablet (250 mg total) by mouth 2 (two) times daily for 7 days. 11/26/21 12/03/21 Yes Stoneking, Reece Leader., MD  clobetasol ointment (TEMOVATE) 0.05 % APPLY TO AFFECTED AREAS TWICE DAILY. Patient taking differently:  Apply 1 Application topically 2 (two) times daily. 11/25/21  Yes Cook, [name], DO  cyanocobalamin (VITAMIN B12) 1000 MCG tablet Take 1,000 mcg by mouth daily.   Yes [provider]  HYDROcodone-acetaminophen (NORCO/VICODIN) 5-325 MG tablet Take 1 tablet by mouth every 6 (six) hours as needed for moderate pain. 11/20/21  Yes Stoneking, Reece Leader., MD  meclizine (ANTIVERT) 25 MG tablet Take 25 mg by mouth daily as needed for dizziness.   Yes [provider]  pravastatin (PRAVACHOL) 40 MG tablet Take 1 tablet (40 mg total) by mouth daily. 04/29/21  Yes Cook, [name], DO  QUEtiapine (SEROQUEL) 25 MG tablet Take 25 mg by mouth at bedtime.   Yes [provider]  tamsulosin (FLOMAX) 0.4 MG CAPS capsule Take 0.4 mg by mouth daily.   Yes [provider]  TRAVATAN Z 0.004 % SOLN ophthalmic solution Place 1 drop into both eyes 2 (two) times daily. 04/13/17  Yes [provider]      Allergies    Patient has no known allergies.    Review of Systems   Review of Systems  All other systems reviewed and are negative.   Physical Exam Updated Vital Signs BP (!) 160/85   Pulse (!) 58   Temp 98.2 F (36.8 C) (Oral)   Resp (!) 9   SpO2 99%  Physical Exam Vitals and nursing note reviewed.  Constitutional:  General: He is in acute distress.     Appearance: He is well-developed. He is ill-appearing and toxic-appearing.  HENT:     Head: Normocephalic and atraumatic.     Mouth/Throat:     Mouth: Mucous membranes are dry.     Pharynx: No oropharyngeal exudate.  Eyes:     General: No scleral icterus.       Right eye: No discharge.        Left eye: No discharge.     Conjunctiva/sclera: Conjunctivae normal.     Pupils: Pupils are equal, round, and reactive to light.  Neck:     Thyroid: No thyromegaly.     Vascular: No JVD.  Cardiovascular:     Rate and Rhythm: Normal rate and regular rhythm.     Heart sounds: Normal heart sounds. No murmur heard.    No  friction rub. No gallop.  Pulmonary:     Effort: Pulmonary effort is normal. No respiratory distress.     Breath sounds: Normal breath sounds. No wheezing or rales.  Abdominal:     General: Bowel sounds are normal. There is no distension.     Palpations: Abdomen is soft. There is no mass.     Tenderness: There is no abdominal tenderness.  Genitourinary:    Comments: Giant edematous scrotum, unable to expose the penis through the severe edema, some skin breakdown on the underside but no redness warmth or significant areas of induration or fluctuance. Musculoskeletal:        General: No tenderness. Normal range of motion.     Cervical back: Normal range of motion and neck supple.     Right lower leg: Edema present.     Left lower leg: Edema present.     Comments: 2+ symmetrical lower extremity edema  Lymphadenopathy:     Cervical: No cervical adenopathy.  Skin:    General: Skin is warm and dry.     Findings: No erythema or rash.  Neurological:     Coordination: Coordination normal.     Comments: The patient is severely somnolent but able to mumble his name, able to grip weakly bilaterally, able to move both of his toes bilaterally but cannot lift his legs, he will not open his eyes, he has a subtle left-sided facial droop but when he opens his mouth that seems to go away.  His pupils are pinpoint  Psychiatric:        Behavior: Behavior normal.     ED Results / Procedures / Treatments   Labs (all labs ordered are listed, but only abnormal results are displayed) Labs Reviewed  LACTIC ACID, PLASMA - Abnormal; Notable for the following components:      Result Value   Lactic Acid, Venous 2.0 (*)    All other components within normal limits  COMPREHENSIVE METABOLIC PANEL - Abnormal; Notable for the following components:   Sodium 147 (*)    Chloride 118 (*)    CO2 20 (*)    BUN 34 (*)    Creatinine, Ser 1.81 (*)    Calcium 8.8 (*)    Albumin 3.0 (*)    AST 74 (*)    ALT 83 (*)     Total Bilirubin 2.0 (*)    GFR, Estimated 35 (*)    All other components within normal limits  CBC WITH DIFFERENTIAL/PLATELET - Abnormal; Notable for the following components:   RBC 2.56 (*)    Hemoglobin 8.9 (*)    HCT 28.1 (*)  MCV 109.8 (*)    MCH 34.8 (*)    RDW 22.3 (*)    Platelets 130 (*)    nRBC 0.9 (*)    Lymphs Abs 0.5 (*)    All other components within normal limits  PROTIME-INR - Abnormal; Notable for the following components:   Prothrombin Time 17.7 (*)    INR 1.5 (*)    All other components within normal limits  APTT - Abnormal; Notable for the following components:   aPTT 37 (*)    All other components within normal limits  RESP PANEL BY RT-PCR (FLU A&B, COVID) ARPGX2  CULTURE, BLOOD (ROUTINE X 2)  CULTURE, BLOOD (ROUTINE X 2)  URINE CULTURE  AMMONIA  LACTIC ACID, PLASMA  URINALYSIS, ROUTINE W REFLEX MICROSCOPIC    EKG EKG Interpretation  Date/Time:  Saturday November 30 2021 15:31:11 EST Ventricular Rate:  66 PR Interval:  163 QRS Duration: 81 QT Interval:  434 QTC Calculation: 455 R Axis:   42 Text Interpretation: Sinus rhythm Ventricular premature complex Borderline low voltage, extremity leads Repol abnrm suggests ischemia, lateral leads since last tracing no significant change Confirmed by Noemi Chapel [phone]) on 11/30/2021 4:00:43 PM  Radiology DG Chest Port 1 View  Result Date: 11/30/2021 CLINICAL DATA:  Altered mental status EXAM: PORTABLE CHEST 1 VIEW COMPARISON:  Chest x-ray dated July 31, 2021 FINDINGS: Cardiac and mediastinal contours are unchanged when accounting for patient rightward rotation. Mild bilateral heterogeneous opacities. More focal opacity of the right lung base no large pleural effusion. No evidence of pneumothorax. IMPRESSION: 1. Mild bilateral heterogeneous opacities, likely due to pulmonary edema. 2. More focal opacity of the at the right lung base is likely due to atelectasis. Electronically Signed   By: Yetta Glassman M.D.   On: 11/30/2021 16:15    Procedures Procedures    Medications Ordered in ED Medications  lactated ringers infusion ( Intravenous New Bag/Given 11/30/21 1704)  cefTRIAXone (ROCEPHIN) 2 g in sodium chloride 0.9 % 100 mL IVPB (2 g Intravenous New Bag/Given 11/30/21 1612)  naloxone (NARCAN) injection 0.4 mg (0.4 mg Intravenous Given 11/30/21 1610)    ED Course/ Medical Decision Making/ A&P                           Medical Decision Making Amount and/or Complexity of Data Reviewed Labs: ordered. Radiology: ordered. ECG/medicine tests: ordered.  Risk Prescription drug management. Decision regarding hospitalization.   This patient presents to the ED for concern of altered mental status after surgery, this involves an extensive number of treatment options, and is a complaint that carries with it a high risk of complications and morbidity.  The differential diagnosis includes organ dysfunction and failure, this could be related to stroke, this could be related to sepsis or infection, acute kidney injury, this could be hyperkalemia if he is retaining this could be acute kidney injury.  Would also consider that this could be related to severe dehydration or infection.   Co morbidities that complicate the patient evaluation  Has had prior myocardial infarction Recent surgery   Additional history obtained:  Additional history obtained from electronic medical record External records from outside source obtained and reviewed including see notes above   Lab Tests:  I Ordered, and personally interpreted labs.  The pertinent results include: BBC shows a slight worsening anemia but no leukocytosis, metabolic panel without significant worsening of his creatinine   Imaging Studies ordered:  I ordered imaging studies  including chest x-ray without findings, CT scan of the head without acute findings I independently visualized and interpreted imaging which showed no acute  findings I agree with the radiologist interpretation   Cardiac Monitoring: / EKG:  The patient was maintained on a cardiac monitor.  I personally viewed and interpreted the cardiac monitored which showed an underlying rhythm of: Normal sinus rhythm   Consultations Obtained:  I requested consultation with the neurologist Dr. Milford Cage,  and discussed lab and imaging findings as well as pertinent plan - they recommend: Transfer to [facility] for Foley placement once patient has had evaluation and hospitalist evaluation for transfer for admission.  The patient unfortunately will likely need interventional radiology if the urologist is unable to pass the catheter thus the patient would benefit medically from transfer to higher level of care such as [facility] as the services are not available at this hospital.  The patient could have potential decline and harm if needing the services thus in discussion with her specialist, transfer to higher level of care as needed and necessary for the patient's optimal outcome. Will discuss with hospitalist for admission, likely needs transfer to [facility] for catheter management, and potential IR intervention if unable to get Foley catheter successfully placed   Problem List / ED Course / Critical interventions / Medication management  Antibiotics given for presumed UTI but no sample was able to be obtained I ordered medication including Rocephin and IV fluids for likely infection Reevaluation of the patient after these medicines showed that the patient continues to be encephalopathic I have reviewed the patients home medicines and have made adjustments as needed   Social Determinants of Health:  Elderly, encephalopathic   Test / Admission - Considered:  We will admit and transfer         Final Clinical Impression(s) / ED Diagnoses Final diagnoses:  Acute encephalopathy  Urinary retention    Rx / DC Orders ED Discharge Orders     None          Noemi Chapel, MD 11/30/21 1731    Noemi Chapel, MD 11/30/21 1745

## 2021-12-01 ENCOUNTER — Other Ambulatory Visit: Payer: Self-pay

## 2021-12-01 DIAGNOSIS — R338 Other retention of urine: Secondary | ICD-10-CM

## 2021-12-01 DIAGNOSIS — G9341 Metabolic encephalopathy: Secondary | ICD-10-CM

## 2021-12-01 DIAGNOSIS — N433 Hydrocele, unspecified: Secondary | ICD-10-CM | POA: Diagnosis not present

## 2021-12-01 LAB — BASIC METABOLIC PANEL
Anion gap: 20 — ABNORMAL HIGH (ref 5–15)
BUN: 32 mg/dL — ABNORMAL HIGH (ref 8–23)
CO2: 15 mmol/L — ABNORMAL LOW (ref 22–32)
Calcium: 9.3 mg/dL (ref 8.9–10.3)
Chloride: 121 mmol/L — ABNORMAL HIGH (ref 98–111)
Creatinine, Ser: 1.77 mg/dL — ABNORMAL HIGH (ref 0.61–1.24)
GFR, Estimated: 36 mL/min — ABNORMAL LOW (ref >60)
Glucose, Bld: 77 mg/dL (ref 70–99)
Potassium: 4 mmol/L (ref 3.5–5.1)
Sodium: 156 mmol/L — ABNORMAL HIGH (ref 135–145)

## 2021-12-01 LAB — CBC
HCT: 28.5 % — ABNORMAL LOW (ref 39.0–52.0)
Hemoglobin: 9.1 g/dL — ABNORMAL LOW (ref 13.0–17.0)
MCH: 35.4 pg — ABNORMAL HIGH (ref 26.0–34.0)
MCHC: 31.9 g/dL (ref 30.0–36.0)
MCV: 110.9 fL — ABNORMAL HIGH (ref 80.0–100.0)
Platelets: 131 10*3/uL — ABNORMAL LOW (ref 150–400)
RBC: 2.57 MIL/uL — ABNORMAL LOW (ref 4.22–5.81)
RDW: 22.6 % — ABNORMAL HIGH (ref 11.5–15.5)
WBC: 7.8 10*3/uL (ref 4.0–10.5)
nRBC: 0.5 % — ABNORMAL HIGH (ref 0.0–0.2)

## 2021-12-01 LAB — GLUCOSE, CAPILLARY: Glucose-Capillary: 136 mg/dL — ABNORMAL HIGH (ref 70–99)

## 2021-12-01 MED ORDER — CHLORHEXIDINE GLUCONATE CLOTH 2 % EX PADS
6.0000 | MEDICATED_PAD | Freq: Every day | CUTANEOUS | Status: DC
  Administered 2021-12-01 – 2021-12-12 (×12): 6 via TOPICAL

## 2021-12-01 MED ORDER — DEXTROSE IN LACTATED RINGERS 5 % IV SOLN: INTRAVENOUS | Status: DC

## 2021-12-01 NOTE — Hospital Course (Signed)
Mr. Goshorn is a 88 yo male with PMH recent hydrocele s/p hydrocelectomy 11/15, CAD, DM II, HLD, HTN who presented with altered mentation and difficulty with urination.  Patient had a Penrose drain after surgery which was discontinued prior to admission by daughter.  Due to developing difficulty with oral intake and worsening weakness and confusion he was brought to the hospital for further evaluation. Urology was consulted for Foley placement due to severe edema.

## 2021-12-01 NOTE — Progress Notes (Signed)
  Subjective: Patient awake and able to answer questions this morning but appears dysarthric.  States having minimal discomfort.  Foley in place draining clear urine.  Objective: Vital signs in last 24 hours: Temp:  [97.5 F (36.4 C)-98.8 F (37.1 C)] 97.5 F (36.4 C) (11/26 0454) Pulse Rate:  [57-74] 57 (11/26 0454) Resp:  [9-18] 17 (11/26 0454) BP: (160-187)/(80-94) 172/80 (11/26 0454) SpO2:  [95 %-100 %] 100 % (11/26 0454) Weight:  [108.9 kg] 108.9 kg (11/25 1925)  Intake/Output from previous day: 11/25 0701 - 11/26 0700 In: 350.2 [P.O.:250; IV Piggyback:100.2] Out: 250 [Urine:250] Intake/Output this shift: No intake/output data recorded.  Physical Exam:  General: Alert and oriented GU: Continues to have significant scrotal/penile edema as expected post recent hydrocele surgery.  Terrance Miller site looks okay no evidence of cellulitis or infection  Lab Results: Recent Labs    11/30/21 1557 12/01/21 0628  HGB 8.9* 9.1*  HCT 28.1* 28.5*   BMET Recent Labs    11/30/21 1557  NA 147*  K 3.8  CL 118*  CO2 20*  GLUCOSE 96  BUN 34*  CREATININE 1.81*  CALCIUM 8.8*     Studies/Results: DG Chest Port 1 View  Result Date: 11/30/2021 CLINICAL DATA:  Altered mental status EXAM: PORTABLE CHEST 1 VIEW COMPARISON:  Chest x-ray dated July 31, 2021 FINDINGS: Cardiac and mediastinal contours are unchanged when accounting for patient rightward rotation. Mild bilateral heterogeneous opacities. More focal opacity of the right lung base no large pleural effusion. No evidence of pneumothorax. IMPRESSION: 1. Mild bilateral heterogeneous opacities, likely due to pulmonary edema. 2. More focal opacity of the at the right lung base is likely due to atelectasis. Electronically Signed   By: Yetta Glassman M.D.   On: 11/30/2021 16:15    Assessment/Plan: 1.  Severe penile scrotal edema post recent hydrocele surgery 2.  Difficulty voiding.  Now with Foley catheter Plan/recommendation.  Continue  Foley catheter until edema resolves.  Will need outpatient follow-up with Dr. Felipa Eth when medically stable in a couple of weeks to reassess the edema before Foley removal.    LOS: 1 day   Terrance Miller 12/01/2021, 9:26 AM

## 2021-12-01 NOTE — Evaluation (Signed)
Physical Therapy Evaluation Patient Details Name: Terrance Miller MRN: [id number] DOB: 1928-07-15 Today's Date: 12/01/2021  History of Present Illness  88 yo male who presented with altered mentation and difficulty with urination, Urology was consulted for Foley placement due to severe edema. recent hydrocele s/p hydrocelectomy 11/15. PMH CAD, DM II, HLD, HTN  Clinical Impression  Pt admitted with above diagnosis.  Pt limited by pain/edema; cooperative, unsure where he was initially but easily reoriented.  follows most commands with incr time, agreeable to mobility, heavy +2 for bed mobility. May need SNF unless dtr able to manage incr  level of assist, no family present at time of PT eval   >significant scotal edema with notable maceration between scrotum and inner thighs, placed pillow cases in skin folds and provided scrotal elevation; pt would benefit from interdry d/t at risk for MASD   Pt currently with functional limitations due to the deficits listed below (see PT Problem List). Pt will benefit from skilled PT to increase their independence and safety with mobility to allow discharge to the venue listed below.          Recommendations for follow up therapy are one component of a multi-disciplinary discharge planning process, led by the attending physician.  Recommendations may be updated based on patient status, additional functional criteria and insurance authorization.  Follow Up Recommendations Skilled nursing-short term rehab (<3 hours/day) Can patient physically be transported by private vehicle: No    Assistance Recommended at Discharge Frequent or constant Supervision/Assistance  Patient can return home with the following  A lot of help with walking and/or transfers;A lot of help with bathing/dressing/bathroom;Assist for transportation;Assistance with cooking/housework;Help with stairs or ramp for entrance    Equipment Recommendations None recommended by PT  Recommendations  for Other Services       Functional Status Assessment Patient has had a recent decline in their functional status and demonstrates the ability to make significant improvements in function in a reasonable and predictable amount of time.     Precautions / Restrictions Precautions Precautions: Fall Precaution Comments: severe scrotal edema/hydrocele Restrictions Weight Bearing Restrictions: No      Mobility  Bed Mobility Overal bed mobility: Needs Assistance Bed Mobility: Supine to Sit, Sit to Supine     Supine to sit: +2 for physical assistance, +2 for safety/equipment, Max assist, Total assist Sit to supine: Max assist, +2 for physical assistance   General bed mobility comments: incr time, assist to elevate trunk and progress LEs off bed; cues for sequence and to self assist    Transfers Overall transfer level: Needs assistance Equipment used: Rolling walker (2 wheels) Transfers: Sit to/from Stand Sit to Stand: Min assist, +2 safety/equipment, +2 physical assistance, From elevated surface           General transfer comment: incr time, assist to rise; able to take lateral steps alogn EOB with RW and min assist to steady and maneuver RW    Ambulation/Gait                  Stairs            Wheelchair Mobility    Modified Rankin (Stroke Patients Only)       Balance Overall balance assessment: Needs assistance Sitting-balance support: Feet supported, Single extremity supported Sitting balance-Leahy Scale: Fair     Standing balance support: Reliant on assistive device for balance, Bilateral upper extremity supported, During functional activity Standing balance-Leahy Scale: Poor  Pertinent Vitals/Pain Pain Assessment Pain Assessment: Faces Faces Pain Scale: Hurts even more Pain Location: right foot/5th toe Pain Descriptors / Indicators: Grimacing Pain Intervention(s): Limited activity within patient's  tolerance, Monitored during session, Repositioned    Home Living Family/patient expects to be discharged to:: Private residence Living Arrangements: Children Available Help at Discharge: Family Type of Home: House Home Access: Stairs to enter   Technical brewer of Steps: 3 Alternate Level Stairs-Number of Steps: 4 Home Layout: Able to live on main level with bedroom/bathroom;Two level Home Equipment: Toilet riser;Cane - single point;Rolling Walker (2 wheels);Rollator (4 wheels) Additional Comments: some info taken from previous admission; pt reports he lives with his dtr; she does all cooking, errands. cleaning/household tasks and assists him if needed    Prior Function               Mobility Comments: walked households distances with a cane and use a walker to walk outside       Hand Dominance        Extremity/Trunk Assessment   Upper Extremity Assessment Upper Extremity Assessment: Defer to OT evaluation    Lower Extremity Assessment Lower Extremity Assessment: Generalized weakness       Communication   Communication: HOH  Cognition Arousal/Alertness: Awake/alert Behavior During Therapy: WFL for tasks assessed/performed Overall Cognitive Status: No family/caregiver present to determine baseline cognitive functioning Area of Impairment: Orientation, Following commands, Problem solving                 Orientation Level: Disoriented to, Place     Following Commands: Follows one step commands with increased time     Problem Solving: Slow processing, Requires verbal cues, Requires tactile cues, Decreased initiation          General Comments General comments (skin integrity, edema, etc.): significant scotal edema with notable maceration between scrotum and inner thighs, placed pillow cases and provided scrotal elevation; pt would benefit from interdry d/t at risk for MASD    Exercises     Assessment/Plan    PT Assessment Patient needs  continued PT services  PT Problem List Decreased strength;Decreased range of motion;Decreased activity tolerance;Decreased balance;Decreased mobility;Decreased skin integrity       PT Treatment Interventions DME instruction;Therapeutic exercise;Gait training;Functional mobility training;Therapeutic activities;Patient/family education;Balance training    PT Goals (Current goals can be found in the Care Plan section)  Acute Rehab PT Goals PT Goal Formulation: With patient Time For Goal Achievement: 12/15/21 Potential to Achieve Goals: Good    Frequency Min 2X/week     Co-evaluation               AM-PAC PT "6 Clicks" Mobility  Outcome Measure Help needed turning from your back to your side while in a flat bed without using bedrails?: Total Help needed moving from lying on your back to sitting on the side of a flat bed without using bedrails?: Total Help needed moving to and from a bed to a chair (including a wheelchair)?: Total Help needed standing up from a chair using your arms (e.g., wheelchair or bedside chair)?: A Lot Help needed to walk in hospital room?: Total Help needed climbing 3-5 steps with a railing? : Total 6 Click Score: 7    End of Session   Activity Tolerance: Patient limited by fatigue;Patient limited by pain Patient left: in bed;with call bell/phone within reach;with bed alarm set   PT Visit Diagnosis: Other abnormalities of gait and mobility (R26.89);Difficulty in walking, not elsewhere classified (R26.2)  Time: 2244-9753 PT Time Calculation (min) (ACUTE ONLY): 21 min   Charges:   PT Evaluation $PT Eval Low Complexity: [place], PT  Acute Rehab Dept [facility]) [phone]  [facility] Weekend Pager [facility] only)  [phone]  12/01/2021   [facility] 12/01/2021, 2:23 PM

## 2021-12-01 NOTE — Evaluation (Addendum)
Occupational Therapy Evaluation Patient Details Name: Terrance Miller MRN: [id number] DOB: 05-27-1928 Today's Date: 12/01/2021   History of Present Illness Patient is a 88 year old male who presented with altered mentation and difficulty with urination, Urology was consulted for Foley placement due to severe edema. recent hydrocele s/p hydrocelectomy 11/15. PMH CAD, DM II, HLD, HTN   Clinical Impression   Patient is a 88 year old male who was admitted for above. Patient was living at home with family support prior level at [facility] level. Currently, patient is +2 for bed mobility with increased pain in scrotal area. Patient was increasingly lethargic during session limiting evaluation. Patients daughter present in room for session reporting that patient would need to be at [facility] level for her to be able to support him at home. Patient was noted to have decreased functional activity tolernace, decreased ROM, decreased BUE strength, decreased endurance, decreased sitting balance, decreased standing balanced, decreased safety awareness, and decreased knowledge of AE/AD impacting participation in ADLs. Patient would continue to benefit from skilled OT services at this time while admitted and after d/c to address noted deficits in order to improve overall safety and independence in ADLs.        Recommendations for follow up therapy are one component of a multi-disciplinary discharge planning process, led by the attending physician.  Recommendations may be updated based on patient status, additional functional criteria and insurance authorization.   Follow Up Recommendations  Skilled nursing-short term rehab (<3 hours/day)     Assistance Recommended at Discharge Frequent or constant Supervision/Assistance  Patient can return home with the following Two people to help with walking and/or transfers;Two people to help with bathing/dressing/bathroom;Direct supervision/assist for financial management;Help with stairs  or ramp for entrance;Direct supervision/assist for medications management;Assistance with cooking/housework;Assist for transportation    Functional Status Assessment  Patient has had a recent decline in their functional status and demonstrates the ability to make significant improvements in function in a reasonable and predictable amount of time.  Equipment Recommendations       Recommendations for Other Services       Precautions / Restrictions Precautions Precautions: Fall Precaution Comments: severe scrotal edema/hydrocele Restrictions Weight Bearing Restrictions: No      Mobility Bed Mobility Overal bed mobility: Needs Assistance Bed Mobility: Rolling Rolling: +2 for physical assistance, +2 for safety/equipment, Max assist                      ADL either performed or assessed with clinical judgement   ADL Overall ADL's : Needs assistance/impaired Eating/Feeding: Maximal assistance;Bed level   Grooming: Bed level;Minimal assistance Grooming Details (indicate cue type and reason): washing face at bed level. Upper Body Bathing: Bed level;Total assistance   Lower Body Bathing: Bed level;Total assistance   Upper Body Dressing : Bed level;Total assistance   Lower Body Dressing: Total assistance;Bed level   Toilet Transfer: +2 for safety/equipment;+2 for physical assistance;Total assistance   Toileting- Clothing Manipulation and Hygiene: +2 for safety/equipment;+2 for physical assistance;Bed level    Patients daughter inquired about UTIs and how to prevent them. Patients daughter was educated on strategies to use to prevent UTIs with absorbent undergarments and hygiene tasks. Patients daughter verbalized understanding.            Vision         Perception     Praxis      Pertinent Vitals/Pain Pain Assessment Pain Assessment: Faces Faces Pain Scale: Hurts even more Pain Location: scrotum with  movement Pain Descriptors / Indicators: Grimacing Pain  Intervention(s): Limited activity within patient's tolerance, Monitored during session, Repositioned     Hand Dominance     Extremity/Trunk Assessment Upper Extremity Assessment Upper Extremity Assessment: Generalized weakness;Difficult to assess due to impaired cognition   Lower Extremity Assessment Lower Extremity Assessment: Defer to PT evaluation       Communication Communication Communication: HOH   Cognition Arousal/Alertness: Lethargic Behavior During Therapy: Flat affect Overall Cognitive Status: Difficult to assess                                 General Comments: patient was hard to assess with family in room and patient noted to have garbled speech at times. patient was lethargic with increased time to respond to all questions needed.     General Comments  significant scotal edema with notable maceration between scrotum and inner thighs, placed pillow cases and provided scrotal elevation; pt would benefit from interdry d/t at risk for MASD    Exercises     Shoulder Instructions      Home Living Family/patient expects to be discharged to:: Private residence Living Arrangements: Children Available Help at Discharge: Family;Available 24 hours/day Type of Home: House Home Access: Stairs to enter [employer] of Steps: 3 Entrance Stairs-Rails: None Home Layout: Able to live on main level with bedroom/bathroom;Two level Alternate Level Stairs-Number of Steps: 4 Alternate Level Stairs-Rails: Can reach both;Right;Left Bathroom Shower/Tub: Tub/shower unit         Home Equipment: Toilet riser;Cane - single point;Rolling Walker (2 wheels);Rollator (4 wheels)   Additional Comments: some info taken from previous admission; pt reports he lives with his dtr; she does all cooking, errands. cleaning/household tasks and assists him if needed      Prior Functioning/Environment               Mobility Comments: walked households distances  with a cane and use a walker to walk outside ADLs Comments: assistance for IADLs, patient makes up his bed each morning prior to breakfast.        OT Problem List: Impaired balance (sitting and/or standing);Decreased safety awareness;Decreased knowledge of precautions;Decreased knowledge of use of DME or AE;Cardiopulmonary status limiting activity;Impaired UE functional use;Increased edema;Decreased activity tolerance;Decreased cognition      OT Treatment/Interventions: Self-care/ADL training;Energy conservation;Therapeutic exercise;DME and/or AE instruction;Therapeutic activities;Patient/family education;Cognitive remediation/compensation;Balance training;Neuromuscular education    OT Goals(Current goals can be found in the care plan section) Acute Rehab OT Goals Patient Stated Goal: none stated OT Goal Formulation: With family Time For Goal Achievement: 12/15/21 Potential to Achieve Goals: Fair  OT Frequency: Min 2X/week    Co-evaluation              AM-PAC OT "6 Clicks" Daily Activity     Outcome Measure Help from another person eating meals?: A Lot Help from another person taking care of personal grooming?: A Lot Help from another person toileting, which includes using toliet, bedpan, or urinal?: Total Help from another person bathing (including washing, rinsing, drying)?: Total Help from another person to put on and taking off regular upper body clothing?: Total Help from another person to put on and taking off regular lower body clothing?: Total 6 Click Score: 8   End of Session Nurse Communication: Other (comment) (ok to participate in session)  Activity Tolerance: Patient limited by fatigue;Patient limited by lethargy Patient left: in bed;with call bell/phone within reach;with family/visitor present  OT Visit Diagnosis: Unsteadiness on feet (R26.81);Other abnormalities of gait and mobility (R26.89)                Time: 3435-6861 OT Time Calculation (min): 33  min Charges:  OT General Charges $OT Visit: 1 Visit OT Evaluation $OT Eval Moderate Complexity: 1 Mod OT Treatments $Self Care/Home Management : 8-22 mins  Rennie Plowman, MS Acute Rehabilitation Department Office# [phone]   Willa Rough 12/01/2021, 4:01 PM

## 2021-12-01 NOTE — Progress Notes (Signed)
Progress Note    Terrance Miller   PIR:[id number]  DOB: 1928/05/28  DOA: 11/30/2021     1 PCP: Coral Spikes, DO  Initial CC: AMS, urinary retention  Hospital Course: Terrance Miller is a 88 yo male with PMH recent hydrocele s/p hydrocelectomy 11/15, CAD, DM II, HLD, HTN who presented with altered mentation and difficulty with urination.  Patient had a Penrose drain after surgery which was discontinued prior to admission by daughter.  Due to developing difficulty with oral intake and worsening weakness and confusion he was brought to the hospital for further evaluation. Urology was consulted for Foley placement due to severe edema.  Interval History:  Seen in his room this morning resting comfortably in bed.  Mentation seems to have improved and he is able to answer some questions.  Denies any lower abdominal pain nor any scrotal/penile pain.  Foley in place.  Assessment and Plan:  Acute metabolic encephalopathy -Most likely in the setting of hypernatremia, dehydration -CT head with no acute intracranial process identified -Continue with IV fluids. -Continue to hold Seroquel -UA negative for evidence of infection   B/L large hydrocele Urinary Retention - s/p bilateral hydrocelectomy by Dr. Felipa Eth 11/15 -Cannot rule out underlying infectious process, will need urology evaluation, ED physician discussed with Dr. Milford Cage who requested transfer to [facility], for Foley catheter insertion and urology evaluation. - foley placed 11/25 and is recommended to remain in place until edema resolves and outpatient followup - UA negative for signs of infection after catheter placed.  Therefore discontinue Rocephin at this time   Hypernatremia -Continue with IV fluids; changed to D5LR   Transaminitis -Likely due to above, continue to trend  -Hold pravastatin   CKD stage IIIb -Continue with IV fluids, avoid nephrotoxic medications   Hyperlipidemia LDL goal <70 - continue with  statin   Hypertension -Continue amlodipine, Coreg   obesity -With BMI of 32   Old records reviewed in assessment of this patient  Antimicrobials: Rocephin 11/30/2021 x 1  DVT prophylaxis:  heparin injection 5,000 Units Start: 11/30/21 2245 SCDs Start: 11/30/21 2159   Code Status:   Code Status: Full Code  Mobility Assessment (last 72 hours)     Mobility Assessment     Row Name 12/01/21 0857 11/30/21 2113         Does patient have an order for bedrest or is patient medically unstable No - Continue assessment No - Continue assessment      What is the highest level of mobility based on the progressive mobility assessment? Level 2 (Chairfast) - Balance while sitting on edge of bed and cannot stand Level 2 (Chairfast) - Balance while sitting on edge of bed and cannot stand      Is the above level different from baseline mobility prior to current illness? Yes - Recommend PT order Yes - Recommend PT order               Barriers to discharge:  Disposition Plan:  Home 2-3 days Status is: Inpt  Objective: Blood pressure (!) 152/80, pulse (!) 51, temperature (!) 97.5 F (36.4 C), temperature source Oral, resp. rate 20, height '5\' 6"'$  (1.676 m), weight 108.9 kg, SpO2 100 %.  Examination:  Physical Exam Constitutional:      General: He is not in acute distress.    Appearance: Normal appearance.  HENT:     Head: Normocephalic.     Mouth/Throat:     Mouth: Mucous membranes are moist.  Eyes:  Extraocular Movements: Extraocular movements intact.  Cardiovascular:     Rate and Rhythm: Normal rate and regular rhythm.  Pulmonary:     Effort: Pulmonary effort is normal.     Breath sounds: Normal breath sounds.  Abdominal:     General: Bowel sounds are normal. There is no distension.     Palpations: Abdomen is soft.     Tenderness: There is no abdominal tenderness.  Genitourinary:    Comments: Grossly edematous scrotum appreciated; unable to visualize penis.  Foley  catheter emerging from edematous scrotum.  No tenderness to palpation.  No drainage appreciated. Musculoskeletal:        General: Normal range of motion.     Cervical back: Normal range of motion and neck supple.  Skin:    General: Skin is warm and dry.  Neurological:     Mental Status: He is alert.     Comments: Follows commands; moves all 4 extremities  Psychiatric:        Mood and Affect: Mood normal.      Consultants:  Urology  Procedures:    Data Reviewed: Results for orders placed or performed during the hospital encounter of 11/30/21 (from the past 24 hour(s))  Lactic acid, plasma     Status: Abnormal   Collection Time: 11/30/21  3:46 PM  Result Value Ref Range   Lactic Acid, Venous 2.0 (HH) 0.5 - 1.9 mmol/L  Comprehensive metabolic panel     Status: Abnormal   Collection Time: 11/30/21  3:57 PM  Result Value Ref Range   Sodium 147 (H) 135 - 145 mmol/L   Potassium 3.8 3.5 - 5.1 mmol/L   Chloride 118 (H) 98 - 111 mmol/L   CO2 20 (L) 22 - 32 mmol/L   Glucose, Bld 96 70 - 99 mg/dL   BUN 34 (H) 8 - 23 mg/dL   Creatinine, Ser 1.81 (H) 0.61 - 1.24 mg/dL   Calcium 8.8 (L) 8.9 - 10.3 mg/dL   Total Protein 7.1 6.5 - 8.1 g/dL   Albumin 3.0 (L) 3.5 - 5.0 g/dL   AST 74 (H) 15 - 41 U/L   ALT 83 (H) 0 - 44 U/L   Alkaline Phosphatase 81 38 - 126 U/L   Total Bilirubin 2.0 (H) 0.3 - 1.2 mg/dL   GFR, Estimated 35 (L) >60 mL/min   Anion gap 9 5 - 15  CBC with Differential     Status: Abnormal   Collection Time: 11/30/21  3:57 PM  Result Value Ref Range   WBC 8.4 4.0 - 10.5 K/uL   RBC 2.56 (L) 4.22 - 5.81 MIL/uL   Hemoglobin 8.9 (L) 13.0 - 17.0 g/dL   HCT 28.1 (L) 39.0 - 52.0 %   MCV 109.8 (H) 80.0 - 100.0 fL   MCH 34.8 (H) 26.0 - 34.0 pg   MCHC 31.7 30.0 - 36.0 g/dL   RDW 22.3 (H) 11.5 - 15.5 %   Platelets 130 (L) 150 - 400 K/uL   nRBC 0.9 (H) 0.0 - 0.2 %   Neutrophils Relative % 83 %   Neutro Abs 7.0 1.7 - 7.7 K/uL   Lymphocytes Relative 6 %   Lymphs Abs 0.5 (L) 0.7  - 4.0 K/uL   Monocytes Relative 10 %   Monocytes Absolute 0.8 0.1 - 1.0 K/uL   Eosinophils Relative 1 %   Eosinophils Absolute 0.1 0.0 - 0.5 K/uL   Basophils Relative 0 %   Basophils Absolute 0.0 0.0 - 0.1 K/uL   Immature  Granulocytes 0 %   Abs Immature Granulocytes 0.03 0.00 - 0.07 K/uL  Protime-INR     Status: Abnormal   Collection Time: 11/30/21  3:57 PM  Result Value Ref Range   Prothrombin Time 17.7 (H) 11.4 - 15.2 seconds   INR 1.5 (H) 0.8 - 1.2  APTT     Status: Abnormal   Collection Time: 11/30/21  3:57 PM  Result Value Ref Range   aPTT 37 (H) 24 - 36 seconds  Blood Culture (routine x 2)     Status: None (Preliminary result)   Collection Time: 11/30/21  3:57 PM   Specimen: BLOOD LEFT FOREARM  Result Value Ref Range   Specimen Description      BLOOD LEFT FOREARM BOTTLES DRAWN AEROBIC AND ANAEROBIC   Special Requests Blood Culture adequate volume    Culture      NO GROWTH < 12 HOURS Performed at [facility], [address] [place], [place] [address]    Report Status PENDING   Ammonia     Status: None   Collection Time: 11/30/21  3:57 PM  Result Value Ref Range   Ammonia 22 9 - 35 umol/L  Resp Panel by RT-PCR (Flu A&B, Covid) Anterior Nasal Swab     Status: None   Collection Time: 11/30/21  4:13 PM   Specimen: Anterior Nasal Swab  Result Value Ref Range   SARS Coronavirus 2 by RT PCR NEGATIVE NEGATIVE   Influenza A by PCR NEGATIVE NEGATIVE   Influenza B by PCR NEGATIVE NEGATIVE  Blood Culture (routine x 2)     Status: None (Preliminary result)   Collection Time: 11/30/21  4:24 PM   Specimen: Right Antecubital; Blood  Result Value Ref Range   Specimen Description      RIGHT ANTECUBITAL BOTTLES DRAWN AEROBIC AND ANAEROBIC   Special Requests Blood Culture adequate volume    Culture      NO GROWTH < 12 HOURS Performed at [facility], [address], [place], [place] [address]    Report Status PENDING   Urinalysis, Routine w reflex microscopic Urine, Catheterized      Status: Abnormal   Collection Time: 11/30/21  9:59 PM  Result Value Ref Range   Color, Urine YELLOW YELLOW   APPearance HAZY (A) CLEAR   Specific Gravity, Urine 1.017 1.005 - 1.030   pH 5.0 5.0 - 8.0   Glucose, UA NEGATIVE NEGATIVE mg/dL   Hgb urine dipstick MODERATE (A) NEGATIVE   Bilirubin Urine NEGATIVE NEGATIVE   Ketones, ur 5 (A) NEGATIVE mg/dL   Protein, ur 30 (A) NEGATIVE mg/dL   Nitrite NEGATIVE NEGATIVE   Leukocytes,Ua NEGATIVE NEGATIVE   RBC / HPF 21-50 0 - 5 RBC/hpf   Bacteria, UA NONE SEEN NONE SEEN   Squamous Epithelial / LPF 0-5 0 - 5   Mucus PRESENT    Ca Oxalate Crys, UA PRESENT   Basic metabolic panel     Status: Abnormal   Collection Time: 12/01/21  5:24 AM  Result Value Ref Range   Sodium 156 (H) 135 - 145 mmol/L   Potassium 4.0 3.5 - 5.1 mmol/L   Chloride 121 (H) 98 - 111 mmol/L   CO2 15 (L) 22 - 32 mmol/L   Glucose, Bld 77 70 - 99 mg/dL   BUN 32 (H) 8 - 23 mg/dL   Creatinine, Ser 1.77 (H) 0.61 - 1.24 mg/dL   Calcium 9.3 8.9 - 10.3 mg/dL   GFR, Estimated 36 (L) >60 mL/min  Anion gap 20 (H) 5 - 15  CBC     Status: Abnormal   Collection Time: 12/01/21  6:28 AM  Result Value Ref Range   WBC 7.8 4.0 - 10.5 K/uL   RBC 2.57 (L) 4.22 - 5.81 MIL/uL   Hemoglobin 9.1 (L) 13.0 - 17.0 g/dL   HCT 28.5 (L) 39.0 - 52.0 %   MCV 110.9 (H) 80.0 - 100.0 fL   MCH 35.4 (H) 26.0 - 34.0 pg   MCHC 31.9 30.0 - 36.0 g/dL   RDW 22.6 (H) 11.5 - 15.5 %   Platelets 131 (L) 150 - 400 K/uL   nRBC 0.5 (H) 0.0 - 0.2 %    I have Reviewed nursing notes, Vitals, and Lab results since pt's last encounter. Pertinent lab results : see above I have ordered test including BMP, CBC, Mg I have reviewed the last note from staff over past 24 hours I have discussed pt's care plan and test results with nursing staff, case manager  Time spent: Greater than 50% of the 55 minute visit was spent in counseling/coordination of care for the patient as laid out in the A&P.    LOS: 1 day    Dwyane Dee, MD [facility] 12/01/2021, 1:27 PM

## 2021-12-02 DIAGNOSIS — N433 Hydrocele, unspecified: Secondary | ICD-10-CM | POA: Diagnosis not present

## 2021-12-02 DIAGNOSIS — R338 Other retention of urine: Secondary | ICD-10-CM | POA: Diagnosis not present

## 2021-12-02 DIAGNOSIS — G9341 Metabolic encephalopathy: Secondary | ICD-10-CM | POA: Diagnosis not present

## 2021-12-02 LAB — CBC WITH DIFFERENTIAL/PLATELET
Abs Immature Granulocytes: 0.04 10*3/uL (ref 0.00–0.07)
Basophils Absolute: 0 10*3/uL (ref 0.0–0.1)
Basophils Relative: 0 %
Eosinophils Absolute: 0.1 10*3/uL (ref 0.0–0.5)
Eosinophils Relative: 1 %
HCT: 27 % — ABNORMAL LOW (ref 39.0–52.0)
Hemoglobin: 8.8 g/dL — ABNORMAL LOW (ref 13.0–17.0)
Immature Granulocytes: 1 %
Lymphocytes Relative: 7 %
Lymphs Abs: 0.5 10*3/uL — ABNORMAL LOW (ref 0.7–4.0)
MCH: 34.9 pg — ABNORMAL HIGH (ref 26.0–34.0)
MCHC: 32.6 g/dL (ref 30.0–36.0)
MCV: 107.1 fL — ABNORMAL HIGH (ref 80.0–100.0)
Monocytes Absolute: 0.6 10*3/uL (ref 0.1–1.0)
Monocytes Relative: 9 %
Neutro Abs: 6 10*3/uL (ref 1.7–7.7)
Neutrophils Relative %: 82 %
Platelets: 118 10*3/uL — ABNORMAL LOW (ref 150–400)
RBC: 2.52 MIL/uL — ABNORMAL LOW (ref 4.22–5.81)
RDW: 21.2 % — ABNORMAL HIGH (ref 11.5–15.5)
WBC: 7.3 10*3/uL (ref 4.0–10.5)
nRBC: 0.4 % — ABNORMAL HIGH (ref 0.0–0.2)

## 2021-12-02 LAB — BASIC METABOLIC PANEL
Anion gap: 7 (ref 5–15)
BUN: 27 mg/dL — ABNORMAL HIGH (ref 8–23)
CO2: 20 mmol/L — ABNORMAL LOW (ref 22–32)
Calcium: 8.6 mg/dL — ABNORMAL LOW (ref 8.9–10.3)
Chloride: 119 mmol/L — ABNORMAL HIGH (ref 98–111)
Creatinine, Ser: 1.48 mg/dL — ABNORMAL HIGH (ref 0.61–1.24)
GFR, Estimated: 44 mL/min — ABNORMAL LOW (ref >60)
Glucose, Bld: 155 mg/dL — ABNORMAL HIGH (ref 70–99)
Potassium: 3.8 mmol/L (ref 3.5–5.1)
Sodium: 146 mmol/L — ABNORMAL HIGH (ref 135–145)

## 2021-12-02 LAB — URINE CULTURE: Culture: NO GROWTH

## 2021-12-02 LAB — MAGNESIUM: Magnesium: 2 mg/dL (ref 1.7–2.4)

## 2021-12-02 LAB — GLUCOSE, CAPILLARY: Glucose-Capillary: 144 mg/dL — ABNORMAL HIGH (ref 70–99)

## 2021-12-02 NOTE — Progress Notes (Addendum)
  Subjective: Patient asleep in bed, arousable to voice but fell back asleep during exam. Did not voice pain to stimuli  Objective: Vital signs in last 24 hours: Temp:  [97.5 F (36.4 C)-98.6 F (37 C)] 97.5 F (36.4 C) (11/27 1256) Pulse Rate:  [56-58] 57 (11/27 1256) Resp:  [14-18] 14 (11/27 1256) BP: (148-186)/(76-96) 157/80 (11/27 1256) SpO2:  [98 %-100 %] 100 % (11/27 1256)  Intake/Output from previous day: 11/26 0701 - 11/27 0700 In: 892.3 [P.O.:396; I.V.:496.3] Out: 600 [Urine:600] Intake/Output this shift: No intake/output data recorded.  Physical Exam:  General: Alert and oriented GU: Continues to have significant scrotal/penile edema as expected post recent hydrocele surgery.  Incision with superficial separation, not deep enough to pack. Overlying mepilex in place. No drainage or active bleeding. Foley with cloudy yellow urine  Lab Results: Recent Labs    11/30/21 1557 12/01/21 0628 12/02/21 0357  HGB 8.9* 9.1* 8.8*  HCT 28.1* 28.5* 27.0*    BMET Recent Labs    12/01/21 0524 12/02/21 0357  NA 156* 146*  K 4.0 3.8  CL 121* 119*  CO2 15* 20*  GLUCOSE 77 155*  BUN 32* 27*  CREATININE 1.77* 1.48*  CALCIUM 9.3 8.6*      Studies/Results: DG Chest Port 1 View  Result Date: 11/30/2021 CLINICAL DATA:  Altered mental status EXAM: PORTABLE CHEST 1 VIEW COMPARISON:  Chest x-ray dated July 31, 2021 FINDINGS: Cardiac and mediastinal contours are unchanged when accounting for patient rightward rotation. Mild bilateral heterogeneous opacities. More focal opacity of the right lung base no large pleural effusion. No evidence of pneumothorax. IMPRESSION: 1. Mild bilateral heterogeneous opacities, likely due to pulmonary edema. 2. More focal opacity of the at the right lung base is likely due to atelectasis. Electronically Signed   By: Yetta Glassman M.D.   On: 11/30/2021 16:15   CT Head Wo Contrast  Result Date: 11/30/2021 CLINICAL DATA:  Neuro deficit, acute,  stroke suspected EXAM: CT HEAD WITHOUT CONTRAST TECHNIQUE: Contiguous axial images were obtained from the base of the skull through the vertex without intravenous contrast. RADIATION DOSE REDUCTION: This exam was performed according to the departmental dose-optimization program which includes automated exposure control, adjustment of the mA and/or kV according to patient size and/or use of iterative reconstruction technique. COMPARISON:  07/27/2021 FINDINGS: Brain: There is periventricular white matter decreased attenuation consistent with small vessel ischemic changes. Ventricles, sulci and cisterns are prominent consistent with age related involutional changes. No acute intracranial hemorrhage, mass effect or shift. No hydrocephalus. Vascular: No hyperdense vessel or unexpected calcification. Skull: Normal. Negative for fracture or focal lesion. Sinuses/Orbits: mucoperiosteal thickening consistent With chronic bilateral maxillary sinusitis. IMPRESSION: Atrophy and chronic small vessel ischemic changes. No acute intracranial process identified. Electronically Signed   By: Sammie Bench M.D.   On: 11/30/2021 16:13    Assessment/Plan: 1.  Severe penile scrotal edema post recent hydrocele surgery with superficial skin separation - recommend local wound care - gentle cleaning with soap and water, reinforce dressing PRN - Suspect wound may continue to separate. Continue to observe for now, continue dressing changes as needed.  2.  Difficulty voiding.  Now with Foley catheter Plan/recommendation.  Continue Foley catheter until edema resolves.   Dispo per primary team. Recommend f/u with Dr Felipa Eth upon discharge    LOS: 2 days   Lamar Laundry 12/02/2021, 3:29 PM

## 2021-12-02 NOTE — Progress Notes (Signed)
Occupational Therapy Treatment Patient Details Name: Terrance Miller MRN: [id number] DOB: [birth year] Today's Date: 12/02/2021   History of present illness Patient is a 88 year old male who presented with altered mentation and difficulty with urination, Urology was consulted for Foley placement due to severe edema. recent hydrocele s/p hydrocelectomy 11/15. PMH CAD, DM II, HLD, HTN   OT comments  Nurse consulted this AM regarding interdry for inner thighs to prevent skin breakdown with increased contact in scrotal area. Nurse to order and apply. Patient was x2 for bed mobility with min A x2 for sit to stand EOB with scrotal sling trial to see if it would increase patients comfort. Patient upon standing was noted to have drainage from unknown source. Patient returned to sitting and attempted standing again to clean area of drainage. Noted to have fluid coming from scrotal area with patient returned to bed. Dr.Girguis came into room for rounds at this time and was educated on drainage and patients scrotal area was assess with opening along incision site. Nurse made aware as well. Patient would continue to benefit from skilled OT services at this time while admitted and after d/c to address noted deficits in order to improve overall safety and independence in ADLs.     Recommendations for follow up therapy are one component of a multi-disciplinary discharge planning process, led by the attending physician.  Recommendations may be updated based on patient status, additional functional criteria and insurance authorization.    Follow Up Recommendations  Skilled nursing-short term rehab (<3 hours/day)     Assistance Recommended at Discharge Frequent or constant Supervision/Assistance  Patient can return home with the following  Two people to help with walking and/or transfers;Two people to help with bathing/dressing/bathroom;Direct supervision/assist for financial management;Help with stairs or ramp for  entrance;Direct supervision/assist for medications management;Assistance with cooking/housework;Assist for transportation         Precautions / Restrictions Precautions Precautions: Fall Precaution Comments: severe scrotal edema/hydrocele Restrictions Weight Bearing Restrictions: No       Mobility Bed Mobility Overal bed mobility: Needs Assistance Bed Mobility: Rolling     Supine to sit: +2 for physical assistance, +2 for safety/equipment, Max assist Sit to supine: Max assist, +2 for physical assistance   General bed mobility comments: incr time, assist to elevate trunk and progress LEs off bed; cues for sequence and to self assist    Transfers Overall transfer level: Needs assistance Equipment used: Rolling walker (2 wheels) Transfers: Sit to/from Stand Sit to Stand: Min assist, +2 safety/equipment, +2 physical assistance, From elevated surface           General transfer comment: incr time and physical assist to transition into standing. patient noted to have drainage and returned to supine.         ADL either performed or assessed with clinical judgement   ADL Overall ADL's : Needs assistance/impaired Eating/Feeding: Minimal assistance;Bed level Eating/Feeding Details (indicate cue type and reason): to take sips from water cup.             General ADL Comments: Patient trialed scrotal sling with use of stockingnet. patient was mod A x2 for supine to sit on edge of bed with increased time. patient trialed standing x 2 with noted drainage on first attempt. stood x2 to complete hygiene thinking it was from BM. on second attempt noted to have drainage from scrotal area and returned to supine. Dr.Girguis in room for rounds to see patient and patient was max A to roll to  each side to assess scrotal area. noted to have opening along wound. MD to contact surgeon. nurse made aware of concerns over skin breakdown between inner thighs and around scrotal area. asked nurse to  order interdry. nurse to look into it.      Cognition Arousal/Alertness: Awake/alert Behavior During Therapy: Flat affect Overall Cognitive Status: Difficult to assess               General Comments: Patient was noted to be more alert today with some garbeld speech easier to understand patient today.                   Pertinent Vitals/ Pain       Pain Assessment Pain Assessment: Faces Faces Pain Scale: Hurts little more Pain Location: scrotum with movement Pain Descriptors / Indicators: Grimacing Pain Intervention(s): Limited activity within patient's tolerance, Monitored during session   Frequency  Min 2X/week        Progress Toward Goals  OT Goals(current goals can now be found in the care plan section)  Progress towards OT goals: Progressing toward goals     Plan Discharge plan remains appropriate       AM-PAC OT "6 Clicks" Daily Activity     Outcome Measure   Help from another person eating meals?: A Lot Help from another person taking care of personal grooming?: A Lot Help from another person toileting, which includes using toliet, bedpan, or urinal?: Total Help from another person bathing (including washing, rinsing, drying)?: Total Help from another person to put on and taking off regular upper body clothing?: Total Help from another person to put on and taking off regular lower body clothing?: Total 6 Click Score: 8    End of Session Equipment Utilized During Treatment: Gait belt;Rolling walker (2 wheels)  OT Visit Diagnosis: Unsteadiness on feet (R26.81);Other abnormalities of gait and mobility (R26.89)   Activity Tolerance Patient limited by fatigue;Other (comment) (drainage from scrotal area)   Patient Left in bed;with call bell/phone within reach   Nurse Communication Other (comment) (concerns over risks of skin breakdown in inner thighs)        Time: 1057-1130 OT Time Calculation (min): 33 min  Charges: OT General Charges $OT  Visit: 1 Visit OT Treatments $Self Care/Home Management : 23-37 mins  Rennie Plowman, MS Acute Rehabilitation Department Office# [phone]   Willa Rough 12/02/2021, 1:15 PM

## 2021-12-02 NOTE — Progress Notes (Signed)
Progress Note    Terrance Miller   [id number]:[id number]  DOB: [birth year]  DOA: 11/30/2021     2 PCP: Coral Spikes, DO  Initial CC: AMS, urinary retention  Hospital Course: Terrance Miller is a 88 yo male with PMH recent hydrocele s/p hydrocelectomy 11/15, CAD, DM II, HLD, HTN who presented with altered mentation and difficulty with urination.  Patient had a Penrose drain after surgery which was discontinued prior to admission by daughter.  Due to developing difficulty with oral intake and worsening weakness and confusion he was brought to the hospital for further evaluation. Urology was consulted for Foley placement due to severe edema.  Interval History:  No events overnight. Still having severe scrotal edema and upon standing with PT this morning was having fluid leaking onto the floor. Wound appears dehisced.   Assessment and Plan:  Acute metabolic encephalopathy -Most likely in the setting of hypernatremia, dehydration -CT head with no acute intracranial process identified -DC fluids for now given severe scrotal edema -Continue to hold Seroquel -UA negative for evidence of infection   B/L large hydrocele Urinary Retention - s/p bilateral hydrocelectomy by Dr. Felipa Eth 11/15 -Foley placed by urology on admission due to severe scrotal edema - foley placed 11/25 and is recommended to remain in place until edema resolves and outpatient followup - UA negative for signs of infection after catheter placed.  Therefore discontinue Rocephin at this time -Appears to have some dehiscence of wound with fluid leak noted on 12/02/2021.  Discussed with urology   Hypernatremia -Some improvement with fluids - Hold fluids for now and continue trending BMP   Transaminitis -Likely due to above, continue to trend  -Hold pravastatin   CKD stage IIIb -Continue with IV fluids, avoid nephrotoxic medications   Hyperlipidemia LDL goal <70 - continue with statin   Hypertension -Continue amlodipine,  Coreg   obesity -With BMI of 32   Old records reviewed in assessment of this patient  Antimicrobials: Rocephin 11/30/2021 x 1  DVT prophylaxis:  heparin injection 5,000 Units Start: 11/30/21 2245 SCDs Start: 11/30/21 2159   Code Status:   Code Status: Full Code  Mobility Assessment (last 72 hours)     Mobility Assessment     Row Name 12/02/21 1313 12/01/21 2349 12/01/21 1558 12/01/21 1422 12/01/21 0857   Does patient have an order for bedrest or is patient medically unstable -- No - Continue assessment -- -- No - Continue assessment   What is the highest level of mobility based on the progressive mobility assessment? Level 3 (Stands with assist) - Balance while standing  and cannot march in place Level 1 (Bedfast) - Unable to balance while sitting on edge of bed Level 1 (Bedfast) - Unable to balance while sitting on edge of bed Level 3 (Stands with assist) - Balance while standing  and cannot march in place Level 2 (Chairfast) - Balance while sitting on edge of bed and cannot stand   Is the above level different from baseline mobility prior to current illness? -- -- -- -- Yes - Recommend PT order    Row Name 11/30/21 2113           Does patient have an order for bedrest or is patient medically unstable No - Continue assessment       What is the highest level of mobility based on the progressive mobility assessment? Level 2 (Chairfast) - Balance while sitting on edge of bed and cannot stand       Is  the above level different from baseline mobility prior to current illness? Yes - Recommend PT order                Barriers to discharge:  Disposition Plan:  SNF once medically stable (3-4 days) Status is: Inpt  Objective: Blood pressure (!) 157/80, pulse (!) 57, temperature (!) 97.5 F (36.4 C), temperature source Axillary, resp. rate 14, height '5\' 6"'$  (1.676 m), weight 108.9 kg, SpO2 100 %.  Examination:  Physical Exam Constitutional:      General: He is not in acute  distress.    Appearance: Normal appearance.  HENT:     Head: Normocephalic.     Mouth/Throat:     Mouth: Mucous membranes are moist.  Eyes:     Extraocular Movements: Extraocular movements intact.  Cardiovascular:     Rate and Rhythm: Normal rate and regular rhythm.  Pulmonary:     Effort: Pulmonary effort is normal.     Breath sounds: Normal breath sounds.  Abdominal:     General: Bowel sounds are normal. There is no distension.     Palpations: Abdomen is soft.     Tenderness: There is no abdominal tenderness.  Genitourinary:    Comments: Grossly edematous scrotum appreciated; unable to visualize penis.  Foley catheter emerging from edematous scrotum.  No tenderness to palpation.  Prior incision now appears to have partial dehiscence, see picture Musculoskeletal:        General: Normal range of motion.     Cervical back: Normal range of motion and neck supple.  Skin:    General: Skin is warm and dry.  Neurological:     Mental Status: He is alert.     Comments: Follows commands; moves all 4 extremities  Psychiatric:        Mood and Affect: Mood normal.   Pic taken 11/27:   Consultants:  Urology  Procedures:    Data Reviewed: Results for orders placed or performed during the hospital encounter of 11/30/21 (from the past 24 hour(s))  Glucose, capillary     Status: Abnormal   Collection Time: 12/01/21  8:45 PM  Result Value Ref Range   Glucose-Capillary 136 (H) 70 - 99 mg/dL  Basic metabolic panel     Status: Abnormal   Collection Time: 12/02/21  3:57 AM  Result Value Ref Range   Sodium 146 (H) 135 - 145 mmol/L   Potassium 3.8 3.5 - 5.1 mmol/L   Chloride 119 (H) 98 - 111 mmol/L   CO2 20 (L) 22 - 32 mmol/L   Glucose, Bld 155 (H) 70 - 99 mg/dL   BUN 27 (H) 8 - 23 mg/dL   Creatinine, Ser 1.48 (H) 0.61 - 1.24 mg/dL   Calcium 8.6 (L) 8.9 - 10.3 mg/dL   GFR, Estimated 44 (L) >60 mL/min   Anion gap 7 5 - 15  CBC with Differential/Platelet     Status: Abnormal    Collection Time: 12/02/21  3:57 AM  Result Value Ref Range   WBC 7.3 4.0 - 10.5 K/uL   RBC 2.52 (L) 4.22 - 5.81 MIL/uL   Hemoglobin 8.8 (L) 13.0 - 17.0 g/dL   HCT 27.0 (L) 39.0 - 52.0 %   MCV 107.1 (H) 80.0 - 100.0 fL   MCH 34.9 (H) 26.0 - 34.0 pg   MCHC 32.6 30.0 - 36.0 g/dL   RDW 21.2 (H) 11.5 - 15.5 %   Platelets 118 (L) 150 - 400 K/uL   nRBC 0.4 (H) 0.0 -  0.2 %   Neutrophils Relative % 82 %   Neutro Abs 6.0 1.7 - 7.7 K/uL   Lymphocytes Relative 7 %   Lymphs Abs 0.5 (L) 0.7 - 4.0 K/uL   Monocytes Relative 9 %   Monocytes Absolute 0.6 0.1 - 1.0 K/uL   Eosinophils Relative 1 %   Eosinophils Absolute 0.1 0.0 - 0.5 K/uL   Basophils Relative 0 %   Basophils Absolute 0.0 0.0 - 0.1 K/uL   Immature Granulocytes 1 %   Abs Immature Granulocytes 0.04 0.00 - 0.07 K/uL  Magnesium     Status: None   Collection Time: 12/02/21  3:57 AM  Result Value Ref Range   Magnesium 2.0 1.7 - 2.4 mg/dL  Glucose, capillary     Status: Abnormal   Collection Time: 12/02/21 10:09 AM  Result Value Ref Range   Glucose-Capillary 144 (H) 70 - 99 mg/dL    I have Reviewed nursing notes, Vitals, and Lab results since pt's last encounter. Pertinent lab results : see above I have ordered test including BMP, CBC, Mg I have reviewed the last note from staff over past 24 hours I have discussed pt's care plan and test results with nursing staff, case manager  Time spent: Greater than 50% of the 55 minute visit was spent in counseling/coordination of care for the patient as laid out in the A&P.    LOS: 2 days   Dwyane Dee, MD [facility] 12/02/2021, 2:50 PM

## 2021-12-03 DIAGNOSIS — N433 Hydrocele, unspecified: Secondary | ICD-10-CM | POA: Diagnosis not present

## 2021-12-03 DIAGNOSIS — R338 Other retention of urine: Secondary | ICD-10-CM | POA: Diagnosis not present

## 2021-12-03 DIAGNOSIS — G9341 Metabolic encephalopathy: Secondary | ICD-10-CM | POA: Diagnosis not present

## 2021-12-03 LAB — BASIC METABOLIC PANEL
Anion gap: 11 (ref 5–15)
BUN: 24 mg/dL — ABNORMAL HIGH (ref 8–23)
CO2: 21 mmol/L — ABNORMAL LOW (ref 22–32)
Calcium: 8.7 mg/dL — ABNORMAL LOW (ref 8.9–10.3)
Chloride: 117 mmol/L — ABNORMAL HIGH (ref 98–111)
Creatinine, Ser: 1.47 mg/dL — ABNORMAL HIGH (ref 0.61–1.24)
GFR, Estimated: 45 mL/min — ABNORMAL LOW (ref >60)
Glucose, Bld: 126 mg/dL — ABNORMAL HIGH (ref 70–99)
Potassium: 3.7 mmol/L (ref 3.5–5.1)
Sodium: 149 mmol/L — ABNORMAL HIGH (ref 135–145)

## 2021-12-03 LAB — CBC WITH DIFFERENTIAL/PLATELET
Abs Immature Granulocytes: 0.03 10*3/uL (ref 0.00–0.07)
Basophils Absolute: 0 10*3/uL (ref 0.0–0.1)
Basophils Relative: 0 %
Eosinophils Absolute: 0.1 10*3/uL (ref 0.0–0.5)
Eosinophils Relative: 1 %
HCT: 28.5 % — ABNORMAL LOW (ref 39.0–52.0)
Hemoglobin: 9.1 g/dL — ABNORMAL LOW (ref 13.0–17.0)
Immature Granulocytes: 0 %
Lymphocytes Relative: 9 %
Lymphs Abs: 0.7 10*3/uL (ref 0.7–4.0)
MCH: 35.5 pg — ABNORMAL HIGH (ref 26.0–34.0)
MCHC: 31.9 g/dL (ref 30.0–36.0)
MCV: 111.3 fL — ABNORMAL HIGH (ref 80.0–100.0)
Monocytes Absolute: 0.7 10*3/uL (ref 0.1–1.0)
Monocytes Relative: 10 %
Neutro Abs: 5.8 10*3/uL (ref 1.7–7.7)
Neutrophils Relative %: 80 %
Platelets: 140 10*3/uL — ABNORMAL LOW (ref 150–400)
RBC: 2.56 MIL/uL — ABNORMAL LOW (ref 4.22–5.81)
RDW: 21.5 % — ABNORMAL HIGH (ref 11.5–15.5)
WBC: 7.3 10*3/uL (ref 4.0–10.5)
nRBC: 0.3 % — ABNORMAL HIGH (ref 0.0–0.2)

## 2021-12-03 LAB — MAGNESIUM: Magnesium: 2.1 mg/dL (ref 1.7–2.4)

## 2021-12-03 NOTE — Progress Notes (Signed)
Progress Note    Terrance Miller   [id number]  DOB: Nov 06, 1928  DOA: 11/30/2021     3 PCP: Coral Spikes, DO  Initial CC: AMS, urinary retention  Hospital Course: Terrance Miller is a 88 yo male with PMH recent hydrocele s/p hydrocelectomy 11/15, CAD, DM II, HLD, HTN who presented with altered mentation and difficulty with urination.  Patient had a Penrose drain after surgery which was discontinued prior to admission by daughter.  Due to developing difficulty with oral intake and worsening weakness and confusion he was brought to the hospital for further evaluation. Urology was consulted for Foley placement due to severe edema.  Interval History:  No events overnight.  Still remains pretty confused this morning.  Otherwise denies any pain.  Assessment and Plan:  Acute metabolic encephalopathy -Most likely in the setting of hypernatremia, dehydration; chronic component? -CT head with no acute intracranial process identified -DC fluids for now given severe scrotal edema -Continue to hold Seroquel -UA negative for evidence of infection   B/L large hydrocele Urinary Retention - s/p bilateral hydrocelectomy by Dr. Felipa Eth 11/15 -Foley placed by urology on admission due to severe scrotal edema - foley placed 11/25 and is recommended to remain in place until edema resolves and outpatient followup - UA negative for signs of infection after catheter placed.  Therefore discontinue Rocephin at this time -Appears to have some dehiscence of wound with fluid leak noted on 12/02/2021. Discussed with urology. Not large enough to pack yet so continue cleansing and covering with dry gauze.   Hypernatremia -Some improvement with fluids - Hold fluids for now and continue trending BMP   Transaminitis -Likely due to above, continue to trend  -Hold pravastatin   CKD stage IIIb -Continue with IV fluids, avoid nephrotoxic medications   Hyperlipidemia LDL goal <70 - continue with statin    Hypertension -Continue amlodipine, Coreg   obesity -With BMI of 32   Old records reviewed in assessment of this patient  Antimicrobials: Rocephin 11/30/2021 x 1  DVT prophylaxis:  heparin injection 5,000 Units Start: 11/30/21 2245 SCDs Start: 11/30/21 2159   Code Status:   Code Status: Full Code  Mobility Assessment (last 72 hours)     Mobility Assessment     Row Name 12/03/21 1155 12/03/21 0806 12/03/21 0029 12/02/21 1313 12/01/21 2349   Does patient have an order for bedrest or is patient medically unstable -- No - Continue assessment No - Continue assessment -- No - Continue assessment   What is the highest level of mobility based on the progressive mobility assessment? Level 2 (Chairfast) - Balance while sitting on edge of bed and cannot stand Level 2 (Chairfast) - Balance while sitting on edge of bed and cannot stand Level 3 (Stands with assist) - Balance while standing  and cannot march in place Level 3 (Stands with assist) - Balance while standing  and cannot march in place Level 1 (Bedfast) - Unable to balance while sitting on edge of bed   Is the above level different from baseline mobility prior to current illness? -- Yes - Recommend PT order -- -- --    Row Name 12/01/21 1558 12/01/21 1422 12/01/21 0857 11/30/21 2113     Does patient have an order for bedrest or is patient medically unstable -- -- No - Continue assessment No - Continue assessment    What is the highest level of mobility based on the progressive mobility assessment? Level 1 (Bedfast) - Unable to balance while sitting on  edge of bed Level 3 (Stands with assist) - Balance while standing  and cannot march in place Level 2 (Chairfast) - Balance while sitting on edge of bed and cannot stand Level 2 (Chairfast) - Balance while sitting on edge of bed and cannot stand    Is the above level different from baseline mobility prior to current illness? -- -- Yes - Recommend PT order Yes - Recommend PT order              Barriers to discharge:  Disposition Plan:  SNF once medically stable (3-4 days) Status is: Inpt  Objective: Blood pressure (!) 145/77, pulse (!) 52, temperature (!) 97.4 F (36.3 C), temperature source Axillary, resp. rate 18, height '5\' 6"'$  (1.676 m), weight 108.9 kg, SpO2 97 %.  Examination:  Physical Exam Constitutional:      General: He is not in acute distress.    Appearance: Normal appearance.  HENT:     Head: Normocephalic.     Mouth/Throat:     Mouth: Mucous membranes are moist.  Eyes:     Extraocular Movements: Extraocular movements intact.  Cardiovascular:     Rate and Rhythm: Normal rate and regular rhythm.  Pulmonary:     Effort: Pulmonary effort is normal.     Breath sounds: Normal breath sounds.  Abdominal:     General: Bowel sounds are normal. There is no distension.     Palpations: Abdomen is soft.     Tenderness: There is no abdominal tenderness.  Genitourinary:    Comments: Grossly edematous scrotum appreciated; unable to visualize penis.  Foley catheter emerging from edematous scrotum.  No tenderness to palpation.  Prior incision now appears to have partial dehiscence, see picture Musculoskeletal:        General: Normal range of motion.     Cervical back: Normal range of motion and neck supple.  Skin:    General: Skin is warm and dry.  Neurological:     Mental Status: He is alert.     Comments: Follows commands; moves all 4 extremities  Psychiatric:        Mood and Affect: Mood normal.   Pic taken 11/27:   Consultants:  Urology  Procedures:    Data Reviewed: Results for orders placed or performed during the hospital encounter of 11/30/21 (from the past 24 hour(s))  Basic metabolic panel     Status: Abnormal   Collection Time: 12/03/21  5:03 AM  Result Value Ref Range   Sodium 149 (H) 135 - 145 mmol/L   Potassium 3.7 3.5 - 5.1 mmol/L   Chloride 117 (H) 98 - 111 mmol/L   CO2 21 (L) 22 - 32 mmol/L   Glucose, Bld 126 (H) 70 - 99 mg/dL    BUN 24 (H) 8 - 23 mg/dL   Creatinine, Ser 1.47 (H) 0.61 - 1.24 mg/dL   Calcium 8.7 (L) 8.9 - 10.3 mg/dL   GFR, Estimated 45 (L) >60 mL/min   Anion gap 11 5 - 15  CBC with Differential/Platelet     Status: Abnormal   Collection Time: 12/03/21  5:03 AM  Result Value Ref Range   WBC 7.3 4.0 - 10.5 K/uL   RBC 2.56 (L) 4.22 - 5.81 MIL/uL   Hemoglobin 9.1 (L) 13.0 - 17.0 g/dL   HCT 28.5 (L) 39.0 - 52.0 %   MCV 111.3 (H) 80.0 - 100.0 fL   MCH 35.5 (H) 26.0 - 34.0 pg   MCHC 31.9 30.0 - 36.0 g/dL  RDW 21.5 (H) 11.5 - 15.5 %   Platelets 140 (L) 150 - 400 K/uL   nRBC 0.3 (H) 0.0 - 0.2 %   Neutrophils Relative % 80 %   Neutro Abs 5.8 1.7 - 7.7 K/uL   Lymphocytes Relative 9 %   Lymphs Abs 0.7 0.7 - 4.0 K/uL   Monocytes Relative 10 %   Monocytes Absolute 0.7 0.1 - 1.0 K/uL   Eosinophils Relative 1 %   Eosinophils Absolute 0.1 0.0 - 0.5 K/uL   Basophils Relative 0 %   Basophils Absolute 0.0 0.0 - 0.1 K/uL   Immature Granulocytes 0 %   Abs Immature Granulocytes 0.03 0.00 - 0.07 K/uL   Target Cells PRESENT   Magnesium     Status: None   Collection Time: 12/03/21  5:03 AM  Result Value Ref Range   Magnesium 2.1 1.7 - 2.4 mg/dL    I have Reviewed nursing notes, Vitals, and Lab results since pt's last encounter. Pertinent lab results : see above I have ordered test including BMP, CBC, Mg I have reviewed the last note from staff over past 24 hours I have discussed pt's care plan and test results with nursing staff, case manager  Time spent: Greater than 50% of the 55 minute visit was spent in counseling/coordination of care for the patient as laid out in the A&P.    LOS: 3 days   Dwyane Dee, MD [facility] 12/03/2021, 2:23 PM

## 2021-12-03 NOTE — Progress Notes (Signed)
  Subjective: Resting comfortably.  Not complaining of any pain  Objective: Vital signs in last 24 hours: Temp:  [97.4 F (36.3 C)-98.3 F (36.8 C)] 98.3 F (36.8 C) (11/28 3428) Pulse Rate:  [54-57] 54 (11/28 0632) Resp:  [14-18] 18 (11/28 7681) BP: (122-160)/(64-92) 160/92 (11/28 0632) SpO2:  [97 %-100 %] 97 % (11/28 1572)  Intake/Output from previous day: 11/27 0701 - 11/28 0700 In: 240 [P.O.:240] Out: 575 [Urine:575] Intake/Output this shift: Total I/O In: 480 [P.O.:480] Out: 0   Physical Exam:  General: Alert and oriented GU: Scrotal incision noted to be with superficial separation.  No significant drainage now.  Has bandage over top. Lab Results: Recent Labs    12/01/21 0628 12/02/21 0357 12/03/21 0503  HGB 9.1* 8.8* 9.1*  HCT 28.5* 27.0* 28.5*   BMET Recent Labs    12/02/21 0357 12/03/21 0503  NA 146* 149*  K 3.8 3.7  CL 119* 117*  CO2 20* 21*  GLUCOSE 155* 126*  BUN 27* 24*  CREATININE 1.48* 1.47*  CALCIUM 8.6* 8.7*     Studies/Results: No results found.  Assessment/Plan: 1.  Status post bilateral hydrocelectomy with significant scrotal and penile edema and difficulty urinating.  Status post Foley placement and now with superficial incisional separation. Plan/recommendation.  Continue local wound care with bandage covering.  If wound becomes more separated it may become deep enough that we could pack it however now appears to be superficially separated and could not keep packing in place.  Medically stable could conceivably be discharged home with outpatient follow-up with Dr. Felipa Eth in [place]    LOS: 3 days   Terrance Miller 12/03/2021, 12:31 PM

## 2021-12-03 NOTE — Plan of Care (Signed)
  Problem: Clinical Measurements: Goal: Ability to maintain clinical measurements within normal limits will improve Outcome: Progressing   Problem: Coping: Goal: Level of anxiety will decrease Outcome: Progressing   Problem: Pain Managment: Goal: General experience of comfort will improve Outcome: Progressing   Problem: Education: Goal: Knowledge of General Education information will improve Description: Including pain rating scale, medication(s)/side effects and non-pharmacologic comfort measures Outcome: Not Progressing   Problem: Elimination: Goal: Will not experience complications related to urinary retention Outcome: Not Progressing   Problem: Clinical Measurements: Goal: Respiratory complications will improve Outcome: Adequate for Discharge

## 2021-12-03 NOTE — Consult Note (Addendum)
[facility] Nurse Consult Note: Reason for Consult: WOC consult requested for scrotum wound prior to Urology team involvement.  Their team has assessed the wound and provided topical treatment orders for the bedside nurses to perform; please refer to their progress notes from 11/27, and refer to their team for further questions regarding plan of care.  Please re-consult if further assistance is needed.  Gae Dry MSN, RN, [place], [place], [place]  Wound type:

## 2021-12-03 NOTE — Progress Notes (Signed)
Physical Therapy Treatment Patient Details Name: Terrance Miller MRN: [id number] DOB: 1928-08-12 Today's Date: 12/03/2021   History of Present Illness Patient is a 88 year old male who presented with altered mentation and difficulty with urination, Urology was consulted for Foley placement due to severe edema. recent hydrocele s/p hydrocelectomy 11/15. PMH CAD, DM II, HLD, HTN    PT Comments    Pt c/o right neck pain on arrival to room.  Pt with significant scrotal edema and issues with wound during OT session yesterday so deferred OOB at this time (also would need significant +2 assist per nursing).  Pt assisted with LE exercises as tolerated and fatigued very quickly. Pt with improved pain in neck and scrotum with pt in chair like position however tilted in trendelenberg position however bed alarm would not allowing leaving pt in this position while staff out of room (RN notified and to determine best course for pt).   Continue to recommend SNF upon d/c.   Recommendations for follow up therapy are one component of a multi-disciplinary discharge planning process, led by the attending physician.  Recommendations may be updated based on patient status, additional functional criteria and insurance authorization.  Follow Up Recommendations  Skilled nursing-short term rehab (<3 hours/day) Can patient physically be transported by private vehicle: No   Assistance Recommended at Discharge Frequent or constant Supervision/Assistance  Patient can return home with the following A lot of help with walking and/or transfers;A lot of help with bathing/dressing/bathroom;Assist for transportation;Assistance with cooking/housework;Help with stairs or ramp for entrance   Equipment Recommendations  None recommended by PT    Recommendations for Other Services       Precautions / Restrictions Precautions Precautions: Fall Precaution Comments: severe scrotal edema/hydrocele     Mobility  Bed  Mobility Overal bed mobility: Needs Assistance             General bed mobility comments: total assist +2 per nursing    Transfers                        Ambulation/Gait                   Stairs             Wheelchair Mobility    Modified Rankin (Stroke Patients Only)       Balance                                            Cognition Arousal/Alertness: Awake/alert Behavior During Therapy: Flat affect Overall Cognitive Status: Difficult to assess Area of Impairment: Following commands, Problem solving                       Following Commands: Follows one step commands with increased time     Problem Solving: Slow processing, Requires verbal cues, Requires tactile cues, Decreased initiation General Comments: speech difficult to understand at times, pt fatigued quickly        Exercises General Exercises - Lower Extremity Ankle Circles/Pumps: AROM, Both, 10 reps Quad Sets: AROM, Both, 10 reps Heel Slides: AAROM, Both, 10 reps, Limitations Heel Slides Limitations: limited neutral flexion, pt needs to have hip in external rotation and some abduction due to scrotal edema Hip ABduction/ADduction: AAROM, Both, 10 reps    General Comments  Pertinent Vitals/Pain Pain Assessment Pain Assessment: Faces Faces Pain Scale: Hurts even more Pain Location: right neck at rest, scrotum with movement Pain Descriptors / Indicators: Grimacing Pain Intervention(s): Repositioned, Monitored during session, Limited activity within patient's tolerance    Home Living                          Prior Function            PT Goals (current goals can now be found in the care plan section) Progress towards PT goals: Progressing toward goals    Frequency    Min 2X/week      PT Plan Current plan remains appropriate    Co-evaluation              AM-PAC PT "6 Clicks" Mobility   Outcome Measure   Help needed turning from your back to your side while in a flat bed without using bedrails?: Total Help needed moving from lying on your back to sitting on the side of a flat bed without using bedrails?: Total Help needed moving to and from a bed to a chair (including a wheelchair)?: Total Help needed standing up from a chair using your arms (e.g., wheelchair or bedside chair)?: A Lot Help needed to walk in hospital room?: Total Help needed climbing 3-5 steps with a railing? : Total 6 Click Score: 7    End of Session   Activity Tolerance: Patient limited by fatigue;Patient limited by pain Patient left: in bed;with call bell/phone within reach;with bed alarm set Nurse Communication: Mobility status PT Visit Diagnosis: Muscle weakness (generalized) (M62.81)     Time: 6962-9528 PT Time Calculation (min) (ACUTE ONLY): 15 min  Charges:  $Therapeutic Exercise: 8-22 mins                    Jannette Spanner PT, DPT Physical Therapist Acute Rehabilitation Services Preferred contact method: Secure Chat Weekend Pager Only: [phone] Office: [phone]    Terrance Miller 12/03/2021, 11:56 AM

## 2021-12-03 NOTE — Addendum Note (Signed)
Addendum  created 12/03/21 0907 by Karna Dupes, CRNA   Intraprocedure Staff edited

## 2021-12-04 ENCOUNTER — Inpatient Hospital Stay: Payer: Medicare Other

## 2021-12-04 DIAGNOSIS — I2510 Atherosclerotic heart disease of native coronary artery without angina pectoris: Secondary | ICD-10-CM

## 2021-12-04 DIAGNOSIS — I10 Essential (primary) hypertension: Secondary | ICD-10-CM

## 2021-12-04 DIAGNOSIS — R338 Other retention of urine: Secondary | ICD-10-CM | POA: Diagnosis not present

## 2021-12-04 DIAGNOSIS — G9341 Metabolic encephalopathy: Secondary | ICD-10-CM | POA: Diagnosis not present

## 2021-12-04 DIAGNOSIS — N179 Acute kidney failure, unspecified: Secondary | ICD-10-CM

## 2021-12-04 DIAGNOSIS — N433 Hydrocele, unspecified: Secondary | ICD-10-CM | POA: Diagnosis not present

## 2021-12-04 DIAGNOSIS — N1831 Chronic kidney disease, stage 3a: Secondary | ICD-10-CM

## 2021-12-04 LAB — MAGNESIUM: Magnesium: 2.3 mg/dL (ref 1.7–2.4)

## 2021-12-04 LAB — CBC WITH DIFFERENTIAL/PLATELET
Abs Immature Granulocytes: 0.1 10*3/uL — ABNORMAL HIGH (ref 0.00–0.07)
Basophils Absolute: 0 10*3/uL (ref 0.0–0.1)
Basophils Relative: 1 %
Eosinophils Absolute: 0.2 10*3/uL (ref 0.0–0.5)
Eosinophils Relative: 3 %
HCT: 27.6 % — ABNORMAL LOW (ref 39.0–52.0)
Hemoglobin: 8.7 g/dL — ABNORMAL LOW (ref 13.0–17.0)
Immature Granulocytes: 2 %
Lymphocytes Relative: 10 %
Lymphs Abs: 0.6 10*3/uL — ABNORMAL LOW (ref 0.7–4.0)
MCH: 34.8 pg — ABNORMAL HIGH (ref 26.0–34.0)
MCHC: 31.5 g/dL (ref 30.0–36.0)
MCV: 110.4 fL — ABNORMAL HIGH (ref 80.0–100.0)
Monocytes Absolute: 0.6 10*3/uL (ref 0.1–1.0)
Monocytes Relative: 10 %
Neutro Abs: 4.6 10*3/uL (ref 1.7–7.7)
Neutrophils Relative %: 74 %
Platelets: 142 10*3/uL — ABNORMAL LOW (ref 150–400)
RBC: 2.5 MIL/uL — ABNORMAL LOW (ref 4.22–5.81)
RDW: 22.3 % — ABNORMAL HIGH (ref 11.5–15.5)
WBC: 6.2 10*3/uL (ref 4.0–10.5)
nRBC: 0.3 % — ABNORMAL HIGH (ref 0.0–0.2)

## 2021-12-04 LAB — BASIC METABOLIC PANEL
Anion gap: 9 (ref 5–15)
BUN: 22 mg/dL (ref 8–23)
CO2: 20 mmol/L — ABNORMAL LOW (ref 22–32)
Calcium: 8.5 mg/dL — ABNORMAL LOW (ref 8.9–10.3)
Chloride: 118 mmol/L — ABNORMAL HIGH (ref 98–111)
Creatinine, Ser: 1.26 mg/dL — ABNORMAL HIGH (ref 0.61–1.24)
GFR, Estimated: 54 mL/min — ABNORMAL LOW (ref >60)
Glucose, Bld: 107 mg/dL — ABNORMAL HIGH (ref 70–99)
Potassium: 4.7 mmol/L (ref 3.5–5.1)
Sodium: 147 mmol/L — ABNORMAL HIGH (ref 135–145)

## 2021-12-04 MED ORDER — DEXTROSE 5 % IV SOLN: INTRAVENOUS | Status: AC

## 2021-12-04 NOTE — Progress Notes (Signed)
PROGRESS NOTE    Gregary Blackard  GYI:[id number] DOB: 1928-04-21 DOA: 11/30/2021 PCP: Coral Spikes, DO   Brief Narrative:  Mr. Nagengast is a 88 yo male with PMH recent hydrocele s/p hydrocelectomy 11/15, CAD, DM II, HLD, HTN who presented with altered mentation and difficulty with urination.  Patient had a Penrose drain after surgery which was discontinued prior to admission by daughter.  Due to developing difficulty with oral intake and worsening weakness and confusion he was brought to the hospital for further evaluation. Urology was consulted for Foley placement due to severe edema.   **Interim History His labs are slowly improving and he continues to have adequate urine output.  Foley was leaking a little bit this morning and urology feels this is related to bladder spasm but they feel that it Foley still draining adequately.  Will resume IV fluids with D5W given his hypernatremia still.  Assessment and Plan:  Acute metabolic encephalopathy -Most likely in the setting of hypernatremia, dehydration; chronic component? -CT head with no acute intracranial process identified -DC fluids for now given severe scrotal edema I will restart the D5W for short-term -Continue to hold Seroquel -UA negative for evidence of infection -Mentation is improving slowly and will need to continue monitor closely   B/L large hydrocele Urinary Retention -S/p bilateral hydrocelectomy by Dr. Felipa Eth 11/15 -Foley placed by urology on admission due to severe scrotal edema -Foley placed 11/25 and is recommended to remain in place until edema resolves and outpatient followup -UA negative for signs of infection after catheter placed.  Therefore discontinued IV Ceftriaxone at this time -Appears to have some dehiscence of wound with fluid leak noted on 12/02/2021.   -My colleague discussed with urology.  -Not large enough to pack yet so continue cleansing and covering with dry gauze and local wound care with  dressing -Patient is to follow-up with Dr. Felipa Eth in outpatient setting -PT OT recommending SNF   Hypernatremia, improving Hyperchloremia -Some improvement with fluids -Na+ went from 156 -> 146 -> 149 -> 147 -Was Holding fluids but will restart D5W and continue to trend BMP CMP in the a.m.   Transaminitis -Likely due to above, continue to trend and repeat LFTs in the AM  -Continue to Hold Pravastatin   AKI on CKD stage IIIa Hyperchloremic metabolic Acidosis -Continue with IV fluids as above -Patient's BUN/Cr went from 32/1.77 -> 27/1.48 -> 24/1.47 -> 22/1.26 -Patient has a slight metabolic acidosis with a CO2 of 20, anion gap of 9, chloride level 118 -Avoid nephrotoxic medications, contrast dyes, hypotension and dehydration to ensure adequate renal perfusion and renally adjust medications -Continue monitor and trend and repeat CMP in the AM    Hyperlipidemia LDL goal <70 -Pravastatin 40 mg po Daily currently held die to Abnormal LFTs  Macrocytic Anemia -Patient's Hemoglobin/hematocrit is gone from 9.1/28.5 -> 8.8/27.0 -> 9.1/28.5 -> 8.7/27.6 with an MCV of 110.4 -Check anemia panel in the a.m. Will continue to monitor for signs and symptoms of bleeding; no overt bleeding noted Can repeat CBC in a.m.   Hypertension -Continue Amlodipine 5 mg po Daily and Carvedilol 6.25 mg po BID -C/w Hydralazine 5 mg IV q4hprn HBP -Continue to Monitor BP per Protocol -Last BP reading was 350/09   Obesity -Complicates overall prognosis and care -Estimated body mass index is 38.74 kg/m as calculated from the following:   Height as of this encounter: '5\' 6"'$  (1.676 m).   Weight as of this encounter: 108.9 kg.  -Weight Loss and Dietary Counseling  given   DVT prophylaxis: heparin injection 5,000 Units Start: 11/30/21 2245 SCDs Start: 11/30/21 2159    Code Status: Full Code Family Communication: No family present at bedside   Disposition Plan:  Level of care: Telemetry Status is:  Inpatient Remains inpatient appropriate because: PT OT recommending SNF and will need improvement in his labs and mentation prior to safe discharge disposition as well as clearance by urology   Consultants:  Urology  Procedures:  As delineated as above  Antimicrobials:  Anti-infectives (From admission, onward)    Start     Dose/Rate Route Frequency Ordered Stop   11/30/21 1600  cefTRIAXone (ROCEPHIN) 2 g in sodium chloride 0.9 % 100 mL IVPB  Status:  Discontinued        2 g 200 mL/hr over 30 Minutes Intravenous Every 24 hours 11/30/21 1546 12/01/21 1330       Subjective: Seen and examined and he was sitting in the chair still little confused.  Nurse was concerned that his Foley was leaking but urology thinks is from a bladder spasm.  He denies any specific complaints.  No other concerns at present time and his penis and scrotum is still extremely swollen.  Objective: Vitals:   12/03/21 2108 12/03/21 2127 12/04/21 0512 12/04/21 1309  BP: (!) 154/74  (!) 162/76 132/72  Pulse: (!) 56 66 (!) 55 (!) 55  Resp: '15  18 20  '$ Temp: 97.8 F (36.6 C)  98 F (36.7 C) 97.6 F (36.4 C)  TempSrc:    Oral  SpO2: 100%  98% 100%  Weight:      Height:        Intake/Output Summary (Last 24 hours) at 12/04/2021 1556 Last data filed at 12/04/2021 1310 Gross per 24 hour  Intake 300 ml  Output --  Net 300 ml   Filed Weights   11/30/21 1925  Weight: 108.9 kg   Examination: Physical Exam:  Constitutional: WN/WD obese elderly African-American male sitting in the chair at bedside in no acute distress but appears little confused Respiratory: Diminished to auscultation bilaterally, no wheezing, rales, rhonchi or crackles. Normal respiratory effort and patient is not tachypenic. No accessory muscle use.  Unlabored breathing Cardiovascular: RRR, no murmurs / rubs / gallops. S1 and S2 auscultated.  S1 plus lower extremity edema Abdomen: Soft, non-tender, distended secondary to body habitus. Bowel  sounds positive.  GU: Significant penile and scrotal edema has an incision site that is opened up a little bit more.  Foley is in place Musculoskeletal: No clubbing / cyanosis of digits/nails.  Normal strength and muscle tone.  Skin: No rashes, lesions, ulcers on limited skin evaluation. No induration; Warm and dry.  Neurologic: CN 2-12 grossly intact with no focal deficits. Romberg sign and cerebellar reflexes not assessed.  Psychiatric: Slightly impaired judgment and insight but he is awake but not fully oriented  Data Reviewed: I have personally reviewed following labs and imaging studies  CBC: Recent Labs  Lab 11/30/21 1557 12/01/21 0628 12/02/21 0357 12/03/21 0503 12/04/21 0656  WBC 8.4 7.8 7.3 7.3 6.2  NEUTROABS 7.0  --  6.0 5.8 4.6  HGB 8.9* 9.1* 8.8* 9.1* 8.7*  HCT 28.1* 28.5* 27.0* 28.5* 27.6*  MCV 109.8* 110.9* 107.1* 111.3* 110.4*  PLT 130* 131* 118* 140* 400*   Basic Metabolic Panel: Recent Labs  Lab 11/30/21 1557 12/01/21 0524 12/02/21 0357 12/03/21 0503 12/04/21 0656  NA 147* 156* 146* 149* 147*  K 3.8 4.0 3.8 3.7 4.7  CL 118* 121* 119*  117* 118*  CO2 20* 15* 20* 21* 20*  GLUCOSE 96 77 155* 126* 107*  BUN 34* 32* 27* 24* 22  CREATININE 1.81* 1.77* 1.48* 1.47* 1.26*  CALCIUM 8.8* 9.3 8.6* 8.7* 8.5*  MG  --   --  2.0 2.1 2.3   GFR: Estimated Creatinine Clearance: 44.2 mL/min (A) (by C-G formula based on SCr of 1.26 mg/dL (H)). Liver Function Tests: Recent Labs  Lab 11/30/21 1557  AST 74*  ALT 83*  ALKPHOS 81  BILITOT 2.0*  PROT 7.1  ALBUMIN 3.0*   No results for input(s): "LIPASE", "AMYLASE" in the last 168 hours. Recent Labs  Lab 11/30/21 1557  AMMONIA 22   Coagulation Profile: Recent Labs  Lab 11/30/21 1557  INR 1.5*   Cardiac Enzymes: No results for input(s): "CKTOTAL", "CKMB", "CKMBINDEX", "TROPONINI" in the last 168 hours. BNP (last 3 results) No results for input(s): "PROBNP" in the last 8760 hours. HbA1C: No results for  input(s): "HGBA1C" in the last 72 hours. CBG: Recent Labs  Lab 12/01/21 2045 12/02/21 1009  GLUCAP 136* 144*   Lipid Profile: No results for input(s): "CHOL", "HDL", "LDLCALC", "TRIG", "CHOLHDL", "LDLDIRECT" in the last 72 hours. Thyroid Function Tests: No results for input(s): "TSH", "T4TOTAL", "FREET4", "T3FREE", "THYROIDAB" in the last 72 hours. Anemia Panel: No results for input(s): "VITAMINB12", "FOLATE", "FERRITIN", "TIBC", "IRON", "RETICCTPCT" in the last 72 hours. Sepsis Labs: Recent Labs  Lab 11/30/21 1546  LATICACIDVEN 2.0*    Recent Results (from the past 240 hour(s))  Blood Culture (routine x 2)     Status: None (Preliminary result)   Collection Time: 11/30/21  3:57 PM   Specimen: BLOOD LEFT FOREARM  Result Value Ref Range Status   Specimen Description   Final    BLOOD LEFT FOREARM BOTTLES DRAWN AEROBIC AND ANAEROBIC   Special Requests Blood Culture adequate volume  Final   Culture   Final    NO GROWTH 2 DAYS Performed at [facility], [address], [place], [place] [address]    Report Status PENDING  Incomplete  Resp Panel by RT-PCR (Flu A&B, Covid) Anterior Nasal Swab     Status: None   Collection Time: 11/30/21  4:13 PM   Specimen: Anterior Nasal Swab  Result Value Ref Range Status   SARS Coronavirus 2 by RT PCR NEGATIVE NEGATIVE Final    Comment: (NOTE) SARS-CoV-2 target nucleic acids are NOT DETECTED.  The SARS-CoV-2 RNA is generally detectable in upper respiratory specimens during the acute phase of infection. The lowest concentration of SARS-CoV-2 viral copies this assay can detect is 138 copies/mL. A negative result does not preclude SARS-Cov-2 infection and should not be used as the sole basis for treatment or other patient management decisions. A negative result may occur with  improper specimen collection/handling, submission of specimen other than nasopharyngeal swab, presence of viral mutation(s) within the areas targeted by this assay, and  inadequate number of viral copies(<138 copies/mL). A negative result must be combined with clinical observations, patient history, and epidemiological information. The expected result is Negative.  Fact Sheet for Patients:  EntrepreneurPulse.com.au  Fact Sheet for Healthcare Providers:  IncredibleEmployment.be  This test is no t yet approved or cleared by the [place] FDA and  has been authorized for detection and/or diagnosis of SARS-CoV-2 by FDA under an Emergency Use Authorization (EUA). This EUA will remain  in effect (meaning this test can be used) for the duration of the COVID-19 declaration under Section 564(b)(1) of the Act, 21 U.S.C.section  360bbb-3(b)(1), unless the authorization is terminated  or revoked sooner.       Influenza A by PCR NEGATIVE NEGATIVE Final   Influenza B by PCR NEGATIVE NEGATIVE Final    Comment: (NOTE) The Xpert Xpress SARS-CoV-2/FLU/RSV plus assay is intended as an aid in the diagnosis of influenza from Nasopharyngeal swab specimens and should not be used as a sole basis for treatment. Nasal washings and aspirates are unacceptable for Xpert Xpress SARS-CoV-2/FLU/RSV testing.  Fact Sheet for Patients: EntrepreneurPulse.com.au  Fact Sheet for Healthcare Providers: IncredibleEmployment.be  This test is not yet approved or cleared by the [place] FDA and has been authorized for detection and/or diagnosis of SARS-CoV-2 by FDA under an Emergency Use Authorization (EUA). This EUA will remain in effect (meaning this test can be used) for the duration of the COVID-19 declaration under Section 564(b)(1) of the Act, 21 U.S.C. section 360bbb-3(b)(1), unless the authorization is terminated or revoked.  Performed at [facility], [address], [place], [place] [address]   Blood Culture (routine x 2)     Status: None (Preliminary result)   Collection Time: 11/30/21  4:24  PM   Specimen: Right Antecubital; Blood  Result Value Ref Range Status   Specimen Description   Final    RIGHT ANTECUBITAL BOTTLES DRAWN AEROBIC AND ANAEROBIC   Special Requests Blood Culture adequate volume  Final   Culture   Final    NO GROWTH 2 DAYS Performed at [facility], [address] [place], [place] [address]    Report Status PENDING  Incomplete  Urine Culture     Status: None   Collection Time: 11/30/21  9:59 PM   Specimen: Urine, Catheterized  Result Value Ref Range Status   Specimen Description   Final    URINE, CATHETERIZED Performed at [facility] [address] [place], [place] [address]    Special Requests   Final    NONE Performed at [facility], [place] [address], [place], [place] [address]    Culture   Final    NO GROWTH Performed at [facility], [place] [address], [place], [place] [address]    Report Status 12/02/2021 FINAL  Final    Radiology Studies: No results found.  Scheduled Meds:  amLODipine  5 mg Oral Daily   aspirin EC  81 mg Oral Daily   carvedilol  6.25 mg Oral BID   Chlorhexidine Gluconate Cloth  6 each Topical Q0600   cholecalciferol  1,000 Units Oral Daily   cyanocobalamin  1,000 mcg Oral Daily   heparin  5,000 Units Subcutaneous Q8H   latanoprost  1 drop Both Eyes QHS   tamsulosin  0.4 mg Oral Daily  Continuous Infusions:   LOS: 4 days   Raiford Noble, DO [facility] Available via Epic secure chat 7am-7pm After these hours, please refer to coverage provider listed on [email or web address] 12/04/2021, 3:56 PM

## 2021-12-04 NOTE — Progress Notes (Signed)
Chaplain engaged in an initial visit with Terrance Miller.  Terrance Miller had just eaten and was in good spirits.  He was thankful for Chaplain presence but had no needs at this time.  He voiced that he is ready to discharge and to get back to traveling.     12/04/21 1200  Clinical Encounter Type  Visited With Patient  Visit Type Initial;Spiritual support

## 2021-12-04 NOTE — Progress Notes (Signed)
  Subjective: Patient seems more lucid today.  Answers questions.  Nurses state he is having drainage around the Foley, likely related to bladder spasm Foley is still draining urine adequately.  Objective: Vital signs in last 24 hours: Temp:  [97.4 F (36.3 C)-98 F (36.7 C)] 98 F (36.7 C) (11/29 0512) Pulse Rate:  [52-66] 55 (11/29 0512) Resp:  [15-18] 18 (11/29 0512) BP: (145-162)/(74-77) 162/76 (11/29 0512) SpO2:  [98 %-100 %] 98 % (11/29 0512)  Intake/Output from previous day: 11/28 0701 - 11/29 0700 In: 540 [P.O.:540] Out: 50 [Urine:50] Intake/Output this shift: No intake/output data recorded.  Physical Exam:  General: Alert and oriented GU: Continues to have significant penile and scrotal edema the incision site has opened a little bit more but still not deep enough to pack. Lab Results: Recent Labs    12/02/21 0357 12/03/21 0503 12/04/21 0656  HGB 8.8* 9.1* 8.7*  HCT 27.0* 28.5* 27.6*   BMET Recent Labs    12/03/21 0503 12/04/21 0656  NA 149* 147*  K 3.7 4.7  CL 117* 118*  CO2 21* 20*  GLUCOSE 126* 107*  BUN 24* 22  CREATININE 1.47* 1.26*  CALCIUM 8.7* 8.5*     Studies/Results: No results found.  Assessment/Plan: 1.  Urinary retention secondary to penile edema status post Foley placement. 2.  Status post hydrocele repair with superficial wound separation Plan/recommendation.  Continue Foley for now local wound care with dressing.  Will need follow-up with Dr. Felipa Eth regarding wound management if discharged home soon.    LOS: 4 days   Terrance Miller 12/04/2021, 11:39 AM

## 2021-12-04 NOTE — Plan of Care (Signed)
  Problem: Clinical Measurements: Goal: Respiratory complications will improve Outcome: Progressing   Problem: Nutrition: Goal: Adequate nutrition will be maintained Outcome: Progressing   Problem: Pain Managment: Goal: General experience of comfort will improve Outcome: Progressing    

## 2021-12-04 NOTE — Progress Notes (Signed)
Occupational Therapy Treatment Patient Details Name: Terrance Miller MRN: [id number] DOB: 28-Feb-1928 Today's Date: 12/04/2021   History of present illness Patient is a 88 year old male who presented with altered mentation and difficulty with urination, Urology was consulted for Foley placement due to severe edema. recent hydrocele s/p hydrocelectomy 11/15. PMH CAD, DM II, HLD, HTN   OT comments  Patient able to transfer to [facility] and then recliner today. He was total assist for toileting. He required +2 for safety - needing tactile and verbal cues and walker management to take steps, transfer to [facility] and then get to recliner. He requested more and more light in his room and he was keeping his eyes mostly shut. Unsure of his visual history. Continue POC.    Recommendations for follow up therapy are one component of a multi-disciplinary discharge planning process, led by the attending physician.  Recommendations may be updated based on patient status, additional functional criteria and insurance authorization.    Follow Up Recommendations  Skilled nursing-short term rehab (<3 hours/day)     Assistance Recommended at Discharge Frequent or constant Supervision/Assistance  Patient can return home with the following  Two people to help with walking and/or transfers;Direct supervision/assist for financial management;Help with stairs or ramp for entrance;Direct supervision/assist for medications management;Assistance with cooking/housework;Assist for transportation;Two people to help with bathing/dressing/bathroom   Equipment Recommendations  Other (comment) (defer)    Recommendations for Other Services      Precautions / Restrictions Precautions Precautions: Fall Precaution Comments: severe scrotal edema/hydrocele, visual deficits Restrictions Weight Bearing Restrictions: No       Mobility Bed Mobility                    Transfers                         Balance Overall  balance assessment: Needs assistance Sitting-balance support: No upper extremity supported, Feet supported       Standing balance support: Reliant on assistive device for balance, Bilateral upper extremity supported, During functional activity Standing balance-Leahy Scale: Poor                             ADL either performed or assessed with clinical judgement   ADL Overall ADL's : Needs assistance/impaired Eating/Feeding: Set up;Sitting Eating/Feeding Details (indicate cue type and reason): to take sips from water cup. Tactile cue for where to place cup                     Toilet Transfer: +2 for safety/equipment;Total assistance;Minimal assistance;BSC/3in1;Rolling walker (2 wheels) Toilet Transfer Details (indicate cue type and reason): +2 min assist to maange walker and for verbal/tactile cues to transfer to [place] Manipulation and Hygiene: Total assistance;Sit to/from stand;+2 for safety/equipment Toileting - Clothing Manipulation Details (indicate cue type and reason): total assist for toileting - another assist for steadying in standing       General ADL Comments: Patient mod x 2 to transfer to edge of bed. But min assist to stand and transfer. Requires multimodal cues and +2 to transfer safely to [facility], perform toileting and then takes steps to get to recliner. With steps to recliner patient exhibited a posterior bias and then needing more mod assist to transfer needing increased physical assistance for standing, turning and walker management.    Extremity/Trunk Assessment  Vision   Additional Comments: Patient requesting more and more light - needing tactile cues. Keeping his eyes closed.  Unsure of visual history.   Perception     Praxis      Cognition Arousal/Alertness: Awake/alert Behavior During Therapy: Flat affect Overall Cognitive Status: Difficult to assess                         Following  Commands: Follows one step commands consistently       General Comments: Needs hand over hand at times but mostly can follow verbal commands. Has visual deficits        Exercises      Shoulder Instructions       General Comments      Pertinent Vitals/ Pain       Pain Assessment Pain Assessment: Faces Pain Location: scrotum Pain Descriptors / Indicators: Grimacing Pain Intervention(s): Monitored during session, Repositioned  Home Living                                          Prior Functioning/Environment              Frequency  Min 2X/week        Progress Toward Goals  OT Goals(current goals can now be found in the care plan section)  Progress towards OT goals: Progressing toward goals  Acute Rehab OT Goals OT Goal Formulation: With family Time For Goal Achievement: 12/15/21 Potential to Achieve Goals: [place] Discharge plan remains appropriate    Co-evaluation                 AM-PAC OT "6 Clicks" Daily Activity     Outcome Measure   Help from another person eating meals?: A Little Help from another person taking care of personal grooming?: A Lot Help from another person toileting, which includes using toliet, bedpan, or urinal?: Total Help from another person bathing (including washing, rinsing, drying)?: A Lot Help from another person to put on and taking off regular upper body clothing?: A Lot Help from another person to put on and taking off regular lower body clothing?: Total 6 Click Score: 11    End of Session Equipment Utilized During Treatment: Rolling walker (2 wheels)  OT Visit Diagnosis: Unsteadiness on feet (R26.81);Other abnormalities of gait and mobility (R26.89)   Activity Tolerance Patient tolerated treatment well   Patient Left in chair;with call bell/phone within reach;with chair alarm set   Nurse Communication Mobility status        Time: 6503-5465 OT Time Calculation (min): 22  min  Charges: OT General Charges $OT Visit: 1 Visit OT Treatments $Self Care/Home Management : 8-22 mins  Gustavo Lah, OTR/L [place]  Office [phone]   Lenward Chancellor 12/04/2021, 11:18 AM

## 2021-12-05 DIAGNOSIS — G9341 Metabolic encephalopathy: Secondary | ICD-10-CM | POA: Diagnosis not present

## 2021-12-05 DIAGNOSIS — N433 Hydrocele, unspecified: Secondary | ICD-10-CM | POA: Diagnosis not present

## 2021-12-05 DIAGNOSIS — E785 Hyperlipidemia, unspecified: Secondary | ICD-10-CM

## 2021-12-05 DIAGNOSIS — I2510 Atherosclerotic heart disease of native coronary artery without angina pectoris: Secondary | ICD-10-CM | POA: Diagnosis not present

## 2021-12-05 DIAGNOSIS — D472 Monoclonal gammopathy: Secondary | ICD-10-CM

## 2021-12-05 DIAGNOSIS — R338 Other retention of urine: Secondary | ICD-10-CM | POA: Diagnosis not present

## 2021-12-05 LAB — CBC WITH DIFFERENTIAL/PLATELET
Abs Immature Granulocytes: 0.08 10*3/uL — ABNORMAL HIGH (ref 0.00–0.07)
Basophils Absolute: 0 10*3/uL (ref 0.0–0.1)
Basophils Relative: 1 %
Eosinophils Absolute: 0.2 10*3/uL (ref 0.0–0.5)
Eosinophils Relative: 3 %
HCT: 25.9 % — ABNORMAL LOW (ref 39.0–52.0)
Hemoglobin: 8.4 g/dL — ABNORMAL LOW (ref 13.0–17.0)
Immature Granulocytes: 1 %
Lymphocytes Relative: 12 %
Lymphs Abs: 0.7 10*3/uL (ref 0.7–4.0)
MCH: 35 pg — ABNORMAL HIGH (ref 26.0–34.0)
MCHC: 32.4 g/dL (ref 30.0–36.0)
MCV: 107.9 fL — ABNORMAL HIGH (ref 80.0–100.0)
Monocytes Absolute: 0.7 10*3/uL (ref 0.1–1.0)
Monocytes Relative: 12 %
Neutro Abs: 4.2 10*3/uL (ref 1.7–7.7)
Neutrophils Relative %: 71 %
Platelets: 141 10*3/uL — ABNORMAL LOW (ref 150–400)
RBC: 2.4 MIL/uL — ABNORMAL LOW (ref 4.22–5.81)
RDW: 21.2 % — ABNORMAL HIGH (ref 11.5–15.5)
WBC: 5.9 10*3/uL (ref 4.0–10.5)
nRBC: 0.5 % — ABNORMAL HIGH (ref 0.0–0.2)

## 2021-12-05 LAB — COMPREHENSIVE METABOLIC PANEL
ALT: 33 U/L (ref 0–44)
AST: 36 U/L (ref 15–41)
Albumin: 2.7 g/dL — ABNORMAL LOW (ref 3.5–5.0)
Alkaline Phosphatase: 65 U/L (ref 38–126)
Anion gap: 3 — ABNORMAL LOW (ref 5–15)
BUN: 22 mg/dL (ref 8–23)
CO2: 23 mmol/L (ref 22–32)
Calcium: 8.4 mg/dL — ABNORMAL LOW (ref 8.9–10.3)
Chloride: 116 mmol/L — ABNORMAL HIGH (ref 98–111)
Creatinine, Ser: 1.46 mg/dL — ABNORMAL HIGH (ref 0.61–1.24)
GFR, Estimated: 45 mL/min — ABNORMAL LOW (ref >60)
Glucose, Bld: 147 mg/dL — ABNORMAL HIGH (ref 70–99)
Potassium: 3.4 mmol/L — ABNORMAL LOW (ref 3.5–5.1)
Sodium: 142 mmol/L (ref 135–145)
Total Bilirubin: 0.9 mg/dL (ref 0.3–1.2)
Total Protein: 6.1 g/dL — ABNORMAL LOW (ref 6.5–8.1)

## 2021-12-05 LAB — CULTURE, BLOOD (ROUTINE X 2)
Culture: NO GROWTH
Culture: NO GROWTH
Special Requests: ADEQUATE
Special Requests: ADEQUATE

## 2021-12-05 LAB — IRON AND TIBC
Iron: 35 ug/dL — ABNORMAL LOW (ref 45–182)
Saturation Ratios: 19 % (ref 17.9–39.5)
TIBC: 190 ug/dL — ABNORMAL LOW (ref 250–450)
UIBC: 155 ug/dL

## 2021-12-05 LAB — FOLATE: Folate: 5.9 ng/mL — ABNORMAL LOW (ref >5.9)

## 2021-12-05 LAB — RETICULOCYTES
Immature Retic Fract: 29.2 % — ABNORMAL HIGH (ref 2.3–15.9)
RBC.: 2.39 MIL/uL — ABNORMAL LOW (ref 4.22–5.81)
Retic Count, Absolute: 60 10*3/uL (ref 19.0–186.0)
Retic Ct Pct: 2.5 % (ref 0.4–3.1)

## 2021-12-05 LAB — FERRITIN: Ferritin: 594 ng/mL — ABNORMAL HIGH (ref 24–336)

## 2021-12-05 LAB — MAGNESIUM: Magnesium: 2 mg/dL (ref 1.7–2.4)

## 2021-12-05 LAB — VITAMIN B12: Vitamin B-12: 1176 pg/mL — ABNORMAL HIGH (ref 180–914)

## 2021-12-05 LAB — PHOSPHORUS: Phosphorus: 2.7 mg/dL (ref 2.5–4.6)

## 2021-12-05 MED ORDER — POTASSIUM CHLORIDE CRYS ER 20 MEQ PO TBCR
40.0000 meq | EXTENDED_RELEASE_TABLET | Freq: Two times a day (BID) | ORAL | Status: AC
  Administered 2021-12-05 – 2021-12-06 (×2): 40 meq via ORAL
  Filled 2021-12-05 (×2): qty 2

## 2021-12-05 NOTE — NC FL2 (Signed)
[place] LEVEL OF CARE FORM     IDENTIFICATION  Patient Name: Terrance Miller Birthdate: 09/20/1928 Sex: male Admission Date (Current Location): 11/30/2021  [place] and [place] Number:  [name]  ([id number]) Facility and Address:  [facility],  [place], [place]      Provider Number: [id number]  Attending Physician Name and Address:  Kerney Elbe, DO  Relative Name and Phone Number:   [name] Carter(dtr) [phone])    Current Level of Care: Hospital Recommended Level of Care: [place] Prior Approval Number:    Date Approved/Denied:   PASRR Number:  [id number])  Discharge Plan: SNF    Current Diagnoses: Patient Active Problem List   Diagnosis Date Noted   Acute urinary retention 21/22/4825   Acute metabolic encephalopathy 00/37/0488   Stage 3b chronic kidney disease ([place]) 11/05/2021   MGUS (monoclonal gammopathy of unknown significance) 11/05/2021   Rash 11/05/2021   Macrocytic anemia 05/31/2021   History of MI (myocardial infarction) 04/29/2021   Hydrocele 10/26/2020   Hypertension 09/28/2013   Hyperlipidemia LDL goal <70 09/28/2013   CAD (coronary artery disease) 09/28/2013    Orientation RESPIRATION BLADDER Height & Weight     Self      Weight: 108.9 kg Height:  '5\' 6"'$  (167.6 cm)  BEHAVIORAL SYMPTOMS/MOOD NEUROLOGICAL BOWEL NUTRITION STATUS        Diet (CHO MOD)  AMBULATORY STATUS COMMUNICATION OF NEEDS Skin   Limited Assist   PU Stage and Appropriate Care PU Stage 1 Dressing: Daily (scrotum-cleanse,dry,gauze qd)                     Personal Care Assistance Level of Assistance  Bathing, Feeding, Dressing Bathing Assistance: Limited assistance Feeding assistance: Limited assistance Dressing Assistance: Limited assistance     Functional Limitations Info  Sight, Hearing, Speech Sight Info: Impaired (eyeglasses) Hearing Info: Adequate Speech Info: Adequate    SPECIAL CARE  FACTORS FREQUENCY  PT (By licensed PT), OT (By licensed OT)     PT Frequency:  (5x week) OT Frequency:  (5x week)            Contractures Contractures Info: Not present    Additional Factors Info  Code Status, Allergies Code Status Info:  (Full) Allergies Info:  (NKA)           Current Medications (12/05/2021):  This is the current hospital active medication list Current Facility-Administered Medications  Medication Dose Route Frequency Provider Last Rate Last Admin   acetaminophen (TYLENOL) tablet 650 mg  650 mg Oral Q6H PRN Elgergawy, Silver Huguenin, MD   650 mg at 12/05/21 8916   Or   acetaminophen (TYLENOL) suppository 650 mg  650 mg Rectal Q6H PRN Elgergawy, Silver Huguenin, MD       amLODipine (NORVASC) tablet 5 mg  5 mg Oral Daily Remi Haggard, MD   5 mg at 12/05/21 0809   aspirin EC tablet 81 mg  81 mg Oral Daily Elgergawy, Silver Huguenin, MD   81 mg at 12/05/21 0809   carvedilol (COREG) tablet 6.25 mg  6.25 mg Oral BID Elgergawy, Silver Huguenin, MD   6.25 mg at 12/05/21 0809   Chlorhexidine Gluconate Cloth 2 % PADS 6 each  6 each Topical Q0600 Dwyane Dee, MD   6 each at 12/05/21 9450   cholecalciferol (VITAMIN D3) 25 MCG (1000 UNIT) tablet 1,000 Units  1,000 Units Oral Daily Elgergawy, Silver Huguenin, MD   1,000 Units at  12/05/21 0809   cyanocobalamin (VITAMIN B12) tablet 1,000 mcg  1,000 mcg Oral Daily Elgergawy, Silver Huguenin, MD   1,000 mcg at 12/05/21 0809   dextrose 5 % solution   Intravenous Continuous Raiford Noble Fargo, DO 75 mL/hr at 12/05/21 0453 New Bag at 12/05/21 0453   heparin injection 5,000 Units  5,000 Units Subcutaneous Q8H Elgergawy, Silver Huguenin, MD   5,000 Units at 12/05/21 4114   hydrALAZINE (APRESOLINE) injection 5 mg  5 mg Intravenous Q4H PRN Elgergawy, Silver Huguenin, MD   5 mg at 12/05/21 0321   latanoprost (XALATAN) 0.005 % ophthalmic solution 1 drop  1 drop Both Eyes QHS Remi Haggard, MD   1 drop at 12/04/21 2251   tamsulosin (FLOMAX) capsule 0.4 mg  0.4 mg Oral Daily  Elgergawy, Silver Huguenin, MD   0.4 mg at 12/05/21 0809     Discharge Medications: Please see discharge summary for a list of discharge medications.  Relevant Imaging Results:  Relevant Lab Results:   Additional Information SSN: [id number] (SSN: [phone])  Urijah Arko, Juliann Pulse, RN

## 2021-12-05 NOTE — Plan of Care (Signed)
  Problem: Education: Goal: Knowledge of General Education information will improve Description: Including pain rating scale, medication(s)/side effects and non-pharmacologic comfort measures Outcome: Not Progressing   

## 2021-12-05 NOTE — Progress Notes (Signed)
  Subjective: Patient resting comfortably.  He has had a fair amount of drainage around the catheter he related to bladder spasm.  Still has urine in the tubing but output only recorded as 30 cc.  Objective: Vital signs in last 24 hours: Temp:  [97.4 F (36.3 C)-97.6 F (36.4 C)] 97.5 F (36.4 C) (11/30 1158) Pulse Rate:  [52-62] 56 (11/30 1158) Resp:  [18-20] 18 (11/30 1158) BP: (132-194)/(63-134) 133/63 (11/30 1158) SpO2:  [100 %] 100 % (11/30 1158)  Intake/Output from previous day: 11/29 0701 - 11/30 0700 In: 1161.1 [P.O.:440; I.V.:721.1] Out: 30 [Urine:30] Intake/Output this shift: Total I/O In: 240 [P.O.:240] Out: -   Physical Exam:  General: Alert and oriented   Lab Results: Recent Labs    12/03/21 0503 12/04/21 0656 12/05/21 0919  HGB 9.1* 8.7* 8.4*  HCT 28.5* 27.6* 25.9*   BMET Recent Labs    12/04/21 0656 12/05/21 0919  NA 147* 142  K 4.7 3.4*  CL 118* 116*  CO2 20* 23  GLUCOSE 107* 147*  BUN 22 22  CREATININE 1.26* 1.46*  CALCIUM 8.5* 8.4*     Studies/Results: No results found.  Assessment/Plan: This was hydrocelectomy with postoperative penile and scrotal edema superficial wound separation and urinary retention Lan/recommendation continue Foley.  Monitor output.  May need to irrigate Foley to ensure patency if output is minimal.    LOS: 5 days   Terrance Miller 12/05/2021, 12:53 PM

## 2021-12-05 NOTE — Progress Notes (Signed)
Pt continues to have no urine in foley but pads soaked x2 during the day today.  Attempted to irrigate but unable to pass irrigant.  Bladder scan showed 54m.  Notified MD.  Will monitor that continues to have urine leaking around the catheter insertion site. DAndre Lefort RN

## 2021-12-05 NOTE — TOC Progression Note (Signed)
Transition of Care [facility]) - Progression Note    Patient Details  Name: Terrance Miller MRN: [id number] Date of Birth: April 23, 1928  [facility] CM/SW Contact  Angelissa Supan, Juliann Pulse, RN Phone Number: 12/05/2021, 2:14 PM  Clinical Narrative: Per permission from dtr maxine-faxed out-await bed offers.        Barriers to Discharge: Continued Medical Work up  Expected Discharge Plan and Services     Discharge Planning Services: CM Consult Post Acute Care Choice: [place] arrangements for the past 2 months: Single Family Home                                       Social Determinants of Health (SDOH) Interventions    Readmission Risk Interventions     No data to display

## 2021-12-05 NOTE — Progress Notes (Signed)
PROGRESS NOTE    Terrance Miller  WEX:[id number] DOB: 1928/05/28 DOA: 11/30/2021 PCP: Coral Spikes, DO   Brief Narrative:  [name] is a 88 yo male with PMH recent hydrocele s/p hydrocelectomy 11/15, CAD, DM II, HLD, HTN who presented with altered mentation and difficulty with urination.  Patient had a Penrose drain after surgery which was discontinued prior to admission by daughter.  Due to developing difficulty with oral intake and worsening weakness and confusion he was brought to the hospital for further evaluation. Urology was consulted for Foley placement due to severe edema.    **Interim History His labs are slowly improving and he continues to have adequate urine output.  Foley was leaking a little bit this morning and urology feels this is related to bladder spasm but they feel that it Foley still draining adequately. Will resume IV fluids with D5W given his hypernatremia but D5W will be stopping today.  PT OT recommending SNF and his labs are improving but mentation is still about the same.  Assessment and Plan:  Acute metabolic encephalopathy, slowly improving -Most likely in the setting of hypernatremia, dehydration; chronic component? -CT head with no acute intracranial process identified -DC fluids for now given severe scrotal edema I will restart the D5W for short-term -Continue to hold Seroquel -UA negative for evidence of infection -Mentation is improving slowly and will need to continue monitor closely -Fluids should be discontinued today and may need some Lasix in the morning -Per nursing he was confused still   B/L large Hydrocele status post hydrocelectomy Urinary Retention -S/p bilateral hydrocelectomy by Dr. Felipa Miller 11/15 -Foley placed by urology on admission due to severe scrotal edema -Foley placed 11/25 and is recommended to remain in place until edema resolves and outpatient followup -UA negative for signs of infection after catheter placed.  Therefore  discontinued IV Ceftriaxone at this time -Appears to have some dehiscence of wound with fluid leak noted on 12/02/2021.   -My colleague has spoke with the Urological Team  -Not large enough to pack yet so continue cleansing and covering with dry gauze and local wound care with dressing -Patient is to follow-up with Dr. Felipa Miller in outpatient setting -PT OT recommending SNF   Hypernatremia, improving Hyperchloremia improving -Some improvement with fluids -Na+ went from 156 -> 146 -> 149 -> 147 and is now improved to 142 -Chloride level is 116 now -Was Holding fluids restarted D5W for 1 day and should expire today and continue to trend BMP CMP in the a.m.  Hypokalemia -Patient's K+ is now gone from 4.7 -> 3.4 -Replete with with po Kcl 40 mEq twice daily x 2 doses -Continue to monitor replete as necessary and repeat CMP in the   Transaminitis -Likely due to above, continue to trend and repeat LFTs in the AM  -Continue to Hold Pravastatin   AKI on CKD stage IIIa Hyperchloremic metabolic Acidosis -Continue with IV fluids as above -Patient's BUN/Cr went from 32/1.77 -> 27/1.48 -> 24/1.47 -> 22/1.26 -> 22/1.46 -Patient had a slight metabolic acidosis with a CO2 of 20, anion gap of 9, chloride level of 938; now metabolic acidosis is improving and he has a CO2 of 23, anion gap of 3, chloride of 116 -Avoid nephrotoxic medications, contrast dyes, hypotension and dehydration to ensure adequate renal perfusion and renally adjust medications -Continue monitor and trend and repeat CMP in the AM    Hyperlipidemia LDL goal <70 -Pravastatin 40 mg po Daily currently held die to Abnormal LFTs  Macrocytic Anemia -Patient's Hemoglobin/hematocrit is gone from 9.1/28.5 -> 8.8/27.0 -> 9.1/28.5 -> 8.7/27.6 -> 8.4/25.9 with an MCV of 107.9 -Anemia panel was checked and showed an iron level of 35, UIBC 155, TIBC of 190, saturation ratios of 19, ferritin level 594, folate level 5.9, and vitamin B12  1176 -Continue to monitor for signs and symptoms of bleeding; no overt bleeding noted -Repeat CBC in a.m.   Hypertension -Continue Amlodipine 5 mg po Daily and Carvedilol 6.25 mg po BID -C/w Hydralazine 5 mg IV q4hprn HBP -Continue to Monitor BP per Protocol -Last BP reading was 133/63  Hypoalbuminemia -Patient's Albumin Level is now 2.7 -Continue to Monitor and Trend and repeat CMP in the AM    Obesity -Complicates overall prognosis and care -Estimated body mass index is 38.74 kg/m as calculated from the following:   Height as of this encounter: '5\' 6"'$  (1.676 m).   Weight as of this encounter: 108.9 kg.  -Weight Loss and Dietary Counseling given  DVT prophylaxis: heparin injection 5,000 Units Start: 11/30/21 2245 SCDs Start: 11/30/21 2159    Code Status: Full Code Family Communication: No family currently at bedside  Disposition Plan:  Level of care: Telemetry Status is: Inpatient Remains inpatient appropriate because: Needs SNF and insurance authorization as well as improvement in his mental status   Consultants:  Urology  Procedures:  As delineated as above   Antimicrobials:  Anti-infectives (From admission, onward)    Start     Dose/Rate Route Frequency Ordered Stop   11/30/21 1600  cefTRIAXone (ROCEPHIN) 2 g in sodium chloride 0.9 % 100 mL IVPB  Status:  Discontinued        2 g 200 mL/hr over 30 Minutes Intravenous Every 24 hours 11/30/21 1546 12/01/21 1330       Subjective: Seen and examined at bedside and he was laying in bed asleep and woke up from sleep and was little confused.  Foley continues to leak.  No nausea or vomiting.  No other concerns or complaints at this time but still remains somewhat confused but labs are improving.  Objective: Vitals:   12/05/21 0312 12/05/21 0318 12/05/21 0826 12/05/21 1158  BP: (!) 194/134 (!) 193/104 (!) 146/74 133/63  Pulse: (!) 58  (!) 52 (!) 56  Resp: '19  18 18  '$ Temp:   (!) 97.4 F (36.3 C) (!) 97.5 F (36.4 C)   TempSrc: Axillary  Oral Oral  SpO2: 100%  100% 100%  Weight:      Height:        Intake/Output Summary (Last 24 hours) at 12/05/2021 1509 Last data filed at 12/05/2021 0900 Gross per 24 hour  Intake 1161.13 ml  Output 30 ml  Net 1131.13 ml   Filed Weights   11/30/21 1925  Weight: 108.9 kg   Examination: Physical Exam:  Constitutional: WN/WD obese elderly African-American male currently in no acute distress sleeping and awoken from sleep and remains confused and pleasantly demented Respiratory: Diminished to auscultation bilaterally with coarse breath sounds, no wheezing, rales, rhonchi or crackles. Normal respiratory effort and patient is not tachypenic. No accessory muscle use.  Unlabored breathing Cardiovascular: RRR, no murmurs / rubs / gallops. S1 and S2 auscultated.  Has 1+ lower extremity edema Abdomen: Soft, non-tender, distended secondary to body habitus. Bowel sounds positive.  GU: Deferred. Musculoskeletal: No clubbing / cyanosis of digits/nails. No joint deformity upper and lower extremities.  Skin: No rashes, lesions, ulcers on limited skin evaluation. No induration; Warm and dry.  Neurologic: He  is a little sleepy but CN 2-12 grossly intact with no focal deficits. Romberg sign and cerebellar reflexes not assessed.  Psychiatric: Impaired judgment and insight is a little somnolent and drowsy  Data Reviewed: I have personally reviewed following labs and imaging studies  CBC: Recent Labs  Lab 11/30/21 1557 12/01/21 0628 12/02/21 0357 12/03/21 0503 12/04/21 0656 12/05/21 0919  WBC 8.4 7.8 7.3 7.3 6.2 5.9  NEUTROABS 7.0  --  6.0 5.8 4.6 4.2  HGB 8.9* 9.1* 8.8* 9.1* 8.7* 8.4*  HCT 28.1* 28.5* 27.0* 28.5* 27.6* 25.9*  MCV 109.8* 110.9* 107.1* 111.3* 110.4* 107.9*  PLT 130* 131* 118* 140* 142* 254*   Basic Metabolic Panel: Recent Labs  Lab 12/01/21 0524 12/02/21 0357 12/03/21 0503 12/04/21 0656 12/05/21 0919  NA 156* 146* 149* 147* 142  K 4.0 3.8 3.7 4.7  3.4*  CL 121* 119* 117* 118* 116*  CO2 15* 20* 21* 20* 23  GLUCOSE 77 155* 126* 107* 147*  BUN 32* 27* 24* 22 22  CREATININE 1.77* 1.48* 1.47* 1.26* 1.46*  CALCIUM 9.3 8.6* 8.7* 8.5* 8.4*  MG  --  2.0 2.1 2.3 2.0  PHOS  --   --   --   --  2.7   GFR: Estimated Creatinine Clearance: 38.1 mL/min (A) (by C-G formula based on SCr of 1.46 mg/dL (H)). Liver Function Tests: Recent Labs  Lab 11/30/21 1557 12/05/21 0919  AST 74* 36  ALT 83* 33  ALKPHOS 81 65  BILITOT 2.0* 0.9  PROT 7.1 6.1*  ALBUMIN 3.0* 2.7*   No results for input(s): "LIPASE", "AMYLASE" in the last 168 hours. Recent Labs  Lab 11/30/21 1557  AMMONIA 22   Coagulation Profile: Recent Labs  Lab 11/30/21 1557  INR 1.5*   Cardiac Enzymes: No results for input(s): "CKTOTAL", "CKMB", "CKMBINDEX", "TROPONINI" in the last 168 hours. BNP (last 3 results) No results for input(s): "PROBNP" in the last 8760 hours. HbA1C: No results for input(s): "HGBA1C" in the last 72 hours. CBG: Recent Labs  Lab 12/01/21 2045 12/02/21 1009  GLUCAP 136* 144*   Lipid Profile: No results for input(s): "CHOL", "HDL", "LDLCALC", "TRIG", "CHOLHDL", "LDLDIRECT" in the last 72 hours. Thyroid Function Tests: No results for input(s): "TSH", "T4TOTAL", "FREET4", "T3FREE", "THYROIDAB" in the last 72 hours. Anemia Panel: Recent Labs    12/05/21 0509 12/05/21 0919  VITAMINB12 1,176*  --   FOLATE 5.9*  --   FERRITIN 594*  --   TIBC 190*  --   IRON 35*  --   RETICCTPCT  --  2.5   Sepsis Labs: Recent Labs  Lab 11/30/21 1546  LATICACIDVEN 2.0*    Recent Results (from the past 240 hour(s))  Blood Culture (routine x 2)     Status: None   Collection Time: 11/30/21  3:57 PM   Specimen: BLOOD LEFT FOREARM  Result Value Ref Range Status   Specimen Description   Final    BLOOD LEFT FOREARM BOTTLES DRAWN AEROBIC AND ANAEROBIC   Special Requests Blood Culture adequate volume  Final   Culture   Final    NO GROWTH 5 DAYS Performed at  [facility], [address], [place], [place] [address]    Report Status 12/05/2021 FINAL  Final  Resp Panel by RT-PCR (Flu A&B, Covid) Anterior Nasal Swab     Status: None   Collection Time: 11/30/21  4:13 PM   Specimen: Anterior Nasal Swab  Result Value Ref Range Status   SARS Coronavirus 2 by RT  PCR NEGATIVE NEGATIVE Final    Comment: (NOTE) SARS-CoV-2 target nucleic acids are NOT DETECTED.  The SARS-CoV-2 RNA is generally detectable in upper respiratory specimens during the acute phase of infection. The lowest concentration of SARS-CoV-2 viral copies this assay can detect is 138 copies/mL. A negative result does not preclude SARS-Cov-2 infection and should not be used as the sole basis for treatment or other patient management decisions. A negative result may occur with  improper specimen collection/handling, submission of specimen other than nasopharyngeal swab, presence of viral mutation(s) within the areas targeted by this assay, and inadequate number of viral copies(<138 copies/mL). A negative result must be combined with clinical observations, patient history, and epidemiological information. The expected result is Negative.  Fact Sheet for Patients:  EntrepreneurPulse.com.au  Fact Sheet for Healthcare Providers:  IncredibleEmployment.be  This test is no t yet approved or cleared by the [place] FDA and  has been authorized for detection and/or diagnosis of SARS-CoV-2 by FDA under an Emergency Use Authorization (EUA). This EUA will remain  in effect (meaning this test can be used) for the duration of the COVID-19 declaration under Section 564(b)(1) of the Act, 21 U.S.C.section 360bbb-3(b)(1), unless the authorization is terminated  or revoked sooner.       Influenza A by PCR NEGATIVE NEGATIVE Final   Influenza B by PCR NEGATIVE NEGATIVE Final    Comment: (NOTE) The Xpert Xpress SARS-CoV-2/FLU/RSV plus assay is intended as an  aid in the diagnosis of influenza from Nasopharyngeal swab specimens and should not be used as a sole basis for treatment. Nasal washings and aspirates are unacceptable for Xpert Xpress SARS-CoV-2/FLU/RSV testing.  Fact Sheet for Patients: EntrepreneurPulse.com.au  Fact Sheet for Healthcare Providers: IncredibleEmployment.be  This test is not yet approved or cleared by the [place] FDA and has been authorized for detection and/or diagnosis of SARS-CoV-2 by FDA under an Emergency Use Authorization (EUA). This EUA will remain in effect (meaning this test can be used) for the duration of the COVID-19 declaration under Section 564(b)(1) of the Act, 21 U.S.C. section 360bbb-3(b)(1), unless the authorization is terminated or revoked.  Performed at [facility], [address], [place], [place] [address]   Blood Culture (routine x 2)     Status: None   Collection Time: 11/30/21  4:24 PM   Specimen: Right Antecubital; Blood  Result Value Ref Range Status   Specimen Description   Final    RIGHT ANTECUBITAL BOTTLES DRAWN AEROBIC AND ANAEROBIC   Special Requests Blood Culture adequate volume  Final   Culture   Final    NO GROWTH 5 DAYS Performed at [facility], [address] [place], [place] [address]    Report Status 12/05/2021 FINAL  Final  Urine Culture     Status: None   Collection Time: 11/30/21  9:59 PM   Specimen: Urine, Catheterized  Result Value Ref Range Status   Specimen Description   Final    URINE, CATHETERIZED Performed at [place] [address] [place], [place] [address]    Special Requests   Final    NONE Performed at [facility], [place] [address] [place], [place] [address]    Culture   Final    NO GROWTH Performed at [facility], [place] [address] [place], [place] [address]    Report Status 12/02/2021 FINAL  Final    Radiology Studies: No results found.  Scheduled  Meds:  amLODipine  5 mg Oral Daily  aspirin EC  81 mg Oral Daily   carvedilol  6.25 mg Oral BID   Chlorhexidine Gluconate Cloth  6 each Topical Q0600   cholecalciferol  1,000 Units Oral Daily   cyanocobalamin  1,000 mcg Oral Daily   heparin  5,000 Units Subcutaneous Q8H   latanoprost  1 drop Both Eyes QHS   tamsulosin  0.4 mg Oral Daily   Continuous Infusions:  dextrose 75 mL/hr at 12/05/21 0453    LOS: 5 days   Raiford Noble, DO [facility] Available via Epic secure chat 7am-7pm After these hours, please refer to coverage provider listed on [email or web address] 12/05/2021, 3:09 PM

## 2021-12-06 DIAGNOSIS — G9341 Metabolic encephalopathy: Secondary | ICD-10-CM | POA: Diagnosis not present

## 2021-12-06 DIAGNOSIS — R338 Other retention of urine: Secondary | ICD-10-CM | POA: Diagnosis not present

## 2021-12-06 DIAGNOSIS — N433 Hydrocele, unspecified: Secondary | ICD-10-CM | POA: Diagnosis not present

## 2021-12-06 DIAGNOSIS — I2510 Atherosclerotic heart disease of native coronary artery without angina pectoris: Secondary | ICD-10-CM | POA: Diagnosis not present

## 2021-12-06 LAB — CBC WITH DIFFERENTIAL/PLATELET
Abs Immature Granulocytes: 0.02 10*3/uL (ref 0.00–0.07)
Basophils Absolute: 0 10*3/uL (ref 0.0–0.1)
Basophils Relative: 1 %
Eosinophils Absolute: 0.2 10*3/uL (ref 0.0–0.5)
Eosinophils Relative: 4 %
HCT: 27.5 % — ABNORMAL LOW (ref 39.0–52.0)
Hemoglobin: 8.7 g/dL — ABNORMAL LOW (ref 13.0–17.0)
Immature Granulocytes: 0 %
Lymphocytes Relative: 16 %
Lymphs Abs: 0.8 10*3/uL (ref 0.7–4.0)
MCH: 34.7 pg — ABNORMAL HIGH (ref 26.0–34.0)
MCHC: 31.6 g/dL (ref 30.0–36.0)
MCV: 109.6 fL — ABNORMAL HIGH (ref 80.0–100.0)
Monocytes Absolute: 0.5 10*3/uL (ref 0.1–1.0)
Monocytes Relative: 11 %
Neutro Abs: 3.2 10*3/uL (ref 1.7–7.7)
Neutrophils Relative %: 68 %
Platelets: 151 10*3/uL (ref 150–400)
RBC: 2.51 MIL/uL — ABNORMAL LOW (ref 4.22–5.81)
RDW: 22 % — ABNORMAL HIGH (ref 11.5–15.5)
WBC: 4.7 10*3/uL (ref 4.0–10.5)
nRBC: 0.6 % — ABNORMAL HIGH (ref 0.0–0.2)

## 2021-12-06 LAB — COMPREHENSIVE METABOLIC PANEL
ALT: 35 U/L (ref 0–44)
AST: 44 U/L — ABNORMAL HIGH (ref 15–41)
Albumin: 2.8 g/dL — ABNORMAL LOW (ref 3.5–5.0)
Alkaline Phosphatase: 72 U/L (ref 38–126)
Anion gap: 7 (ref 5–15)
BUN: 19 mg/dL (ref 8–23)
CO2: 21 mmol/L — ABNORMAL LOW (ref 22–32)
Calcium: 8.5 mg/dL — ABNORMAL LOW (ref 8.9–10.3)
Chloride: 115 mmol/L — ABNORMAL HIGH (ref 98–111)
Creatinine, Ser: 1.33 mg/dL — ABNORMAL HIGH (ref 0.61–1.24)
GFR, Estimated: 50 mL/min — ABNORMAL LOW (ref >60)
Glucose, Bld: 104 mg/dL — ABNORMAL HIGH (ref 70–99)
Potassium: 3.9 mmol/L (ref 3.5–5.1)
Sodium: 143 mmol/L (ref 135–145)
Total Bilirubin: 0.8 mg/dL (ref 0.3–1.2)
Total Protein: 6.5 g/dL (ref 6.5–8.1)

## 2021-12-06 LAB — MAGNESIUM: Magnesium: 2.4 mg/dL (ref 1.7–2.4)

## 2021-12-06 LAB — PHOSPHORUS: Phosphorus: 3 mg/dL (ref 2.5–4.6)

## 2021-12-06 MED ORDER — FOLIC ACID 1 MG PO TABS
1.0000 mg | ORAL_TABLET | Freq: Every day | ORAL | Status: DC
  Administered 2021-12-06 – 2021-12-12 (×7): 1 mg via ORAL
  Filled 2021-12-06 (×7): qty 1

## 2021-12-06 MED ORDER — FUROSEMIDE 10 MG/ML IJ SOLN
40.0000 mg | Freq: Once | INTRAMUSCULAR | Status: AC
  Administered 2021-12-06: 40 mg via INTRAVENOUS
  Filled 2021-12-06: qty 4

## 2021-12-06 NOTE — Progress Notes (Signed)
Occupational Therapy Treatment Patient Details Name: Terrance Miller MRN: [id number] DOB: 1928/02/15 Today's Date: 12/06/2021   History of present illness Patient is a 88 year old male who presented with altered mentation and difficulty with urination, Urology was consulted for Foley placement due to severe edema. recent hydrocele s/p hydrocelectomy 11/15. PMH CAD, DM II, HLD, HTN   OT comments  Patient presents today with more edema in scrotum, sides and feet. He is more alert today and can follow commands if loud enough. He needed multimodal cues for transfer to recliner with walker but exhibited improved standing tolerance and posture. Still needs total assist for toileting after being found incontinent.    Recommendations for follow up therapy are one component of a multi-disciplinary discharge planning process, led by the attending physician.  Recommendations may be updated based on patient status, additional functional criteria and insurance authorization.    Follow Up Recommendations  Skilled nursing-short term rehab (<3 hours/day)     Assistance Recommended at Discharge Frequent or constant Supervision/Assistance  Patient can return home with the following  Two people to help with walking and/or transfers;Direct supervision/assist for financial management;Help with stairs or ramp for entrance;Direct supervision/assist for medications management;Assistance with cooking/housework;Assist for transportation;A lot of help with bathing/dressing/bathroom   Equipment Recommendations  Other (comment) (defer)    Recommendations for Other Services      Precautions / Restrictions Precautions Precautions: Fall Precaution Comments: severe scrotal edema/hydrocele, visual deficits? Restrictions Weight Bearing Restrictions: No       Mobility Bed Mobility                    Transfers                         Balance Overall balance assessment: Needs  assistance Sitting-balance support: No upper extremity supported, Feet supported Sitting balance-Leahy Scale: Fair     Standing balance support: Reliant on assistive device for balance, Bilateral upper extremity supported, During functional activity Standing balance-Leahy Scale: Poor                             ADL either performed or assessed with clinical judgement   ADL Overall ADL's : Needs assistance/impaired Eating/Feeding: Set up;Sitting Eating/Feeding Details (indicate cue type and reason): Able to drink from cup/straw with improved ability and alertness today. Grooming: Set up;Sitting;Wash/dry face Grooming Details (indicate cue type and reason): washed face seated in recliner.                     Toileting- Clothing Manipulation and Hygiene: Total assistance;Sit to/from stand;+2 for safety/equipment Toileting - Clothing Manipulation Details (indicate cue type and reason): Found to be incontnent of stool with standing. Total assist for pericare. Able to stand during task holding onto walker. Improved posture and standing tolerance.       General ADL Comments: Max assist for supine to sit with +2 asssistance. But min assist x 2 to stand from bed and take steps to recliner. Needed assistance for walker management and verbal cues on where to go.    Extremity/Trunk Assessment              Vision       Perception     Praxis      Cognition Arousal/Alertness: Awake/alert Behavior During Therapy: Flat affect Overall Cognitive Status: Within Functional Limits for tasks assessed  General Comments: Able to follow all commands today with visual cues.        Exercises      Shoulder Instructions       General Comments      Pertinent Vitals/ Pain       Pain Assessment Pain Assessment: Faces Faces Pain Scale: Hurts little more Pain Location: scrotum Pain Descriptors / Indicators: Grimacing,  Guarding Pain Intervention(s): Monitored during session  Home Living                                          Prior Functioning/Environment              Frequency  Min 2X/week        Progress Toward Goals  OT Goals(current goals can now be found in the care plan section)  Progress towards OT goals: Progressing toward goals  Acute Rehab OT Goals OT Goal Formulation: Patient unable to participate in goal setting Time For Goal Achievement: 12/15/21 Potential to Achieve Goals: [place] Discharge plan remains appropriate    Co-evaluation    PT/OT/SLP Co-Evaluation/Treatment: Yes Reason for Co-Treatment: To address functional/ADL transfers;For patient/therapist safety PT goals addressed during session: Mobility/safety with mobility OT goals addressed during session: ADL's and self-care      AM-PAC OT "6 Clicks" Daily Activity     Outcome Measure   Help from another person eating meals?: A Little Help from another person taking care of personal grooming?: A Little Help from another person toileting, which includes using toliet, bedpan, or urinal?: Total Help from another person bathing (including washing, rinsing, drying)?: A Lot Help from another person to put on and taking off regular upper body clothing?: A Lot Help from another person to put on and taking off regular lower body clothing?: Total 6 Click Score: 12    End of Session Equipment Utilized During Treatment: Rolling walker (2 wheels)  OT Visit Diagnosis: Unsteadiness on feet (R26.81);Other abnormalities of gait and mobility (R26.89)   Activity Tolerance Patient tolerated treatment well   Patient Left in chair;with call bell/phone within reach;with chair alarm set   Nurse Communication Mobility status        Time: 2585-2778 OT Time Calculation (min): 24 min  Charges: OT General Charges $OT Visit: 1 Visit OT Treatments $Self Care/Home Management : 8-22 mins  Terrance Miller, OTR/L [place]  Office [phone]   Lenward Chancellor 12/06/2021, 3:52 PM

## 2021-12-06 NOTE — Progress Notes (Signed)
PROGRESS NOTE    Terrance Miller  ALP:[id number] DOB: 1928-07-09 DOA: 11/30/2021 PCP: Coral Spikes, DO   Brief Narrative:  Terrance Miller is a 88 yo male with PMH recent hydrocele s/p hydrocelectomy 11/15, CAD, DM II, HLD, HTN who presented with altered mentation and difficulty with urination.  Patient had a Penrose drain after surgery which was discontinued prior to admission by daughter.  Due to developing difficulty with oral intake and worsening weakness and confusion he was brought to the hospital for further evaluation. Urology was consulted for Foley placement due to severe edema.    **Interim History His labs are slowly improving and he continues to have adequate urine output.  Foley was leaking a little bit this morning and urology feels this is related to bladder spasm but they feel that it Foley still draining adequately. Will resume IV fluids with D5W given his hypernatremia but D5W will be stopping today.  PT OT recommending SNF and his labs are improving but mentation is still about the same and he did sundown last night.  Had issues with his Foley catheter and so now urology is going to be evaluating.  Will give him some Lasix given his swelling.  Assessment and Plan:  Acute metabolic encephalopathy, slowly improving -Most likely in the setting of hypernatremia, dehydration; chronic component? -CT head with no acute intracranial process identified -DC fluids for now given severe scrotal edema I will restart the D5W for short-term which is now stopped  -Continue to hold Seroquel -UA negative for evidence of infection -Mentation is improving slowly and will need to continue monitor closely -Fluids should be discontinued yesterday and will try some IV Lasix today given swelling -Per nursing he was confused still and sun-downed last night -Placed on Delirium Precautions    B/L large Hydrocele status post hydrocelectomy Urinary Retention -S/p bilateral hydrocelectomy by Dr. Felipa Eth  11/15 -Foley placed by urology on admission due to severe scrotal edema -Foley placed 11/25 and is recommended to remain in place until edema resolves and outpatient followup -UA negative for signs of infection after catheter placed Therefore discontinued IV Ceftriaxone at this time -Appears to have some dehiscence of wound with fluid leak noted on 12/02/2021.   -Foley continues to leak and right bladder scan shows 0 and he is 0 output in the bag; urology has been notified and they are recommending obtaining it to be strange and saline for irrigation and they will get a Foley catheter evaluate during lunchtime -My colleague has spoke with the Urological Team  -Not large enough to pack yet so continue cleansing and covering with dry gauze and local wound care with dressing -Patient is to follow-up with Dr. Felipa Eth in outpatient setting -PT OT recommending SNF   Hypernatremia, improving Hyperchloremia improving -Some improvement with fluids -Na+ went from 156 -> 146 -> 149 -> 147 -> 142 yesterday and is now 143 today  -Chloride level is 115 now -Was Holding fluids restarted D5W for 1 day and should expire today and continue to trend BMP CMP in the a.m.   Hypokalemia -Patient's K+ is now gone from 4.7 -> 3.4 -> 3.9 -Continue to monitor replete as necessary and repeat CMP in the   Transaminitis -Likely due to above, continue to trend and repeat LFTs in the AM  -AST went from 36 -> 44 and ALT is gone from 33 -> 35 -Continue to Hold Pravastatin   AKI on CKD stage IIIa Hyperchloremic metabolic Acidosis -Continue with IV fluids as above -  Patient's BUN/Cr went from 32/1.77 -> 27/1.48 -> 24/1.47 -> 22/1.26 -> 22/1.46 and is further improved to 19/1.33 -Patient has a slight metabolic acidosis with a CO2 of 21, anion gap of 7, chloride level of 115 -Avoid nephrotoxic medications, contrast dyes, hypotension and dehydration to ensure adequate renal perfusion and renally adjust  medications -Continue monitor and trend and repeat CMP in the AM    Hyperlipidemia LDL goal <70 -Pravastatin 40 mg po Daily currently held due to Abnormal LFTs   Macrocytic Anemia -Patient's Hemoglobin/hematocrit is gone from 9.1/28.5 -> 8.8/27.0 -> 9.1/28.5 -> 8.7/27.6 -> 8.4/25.9 -> 8.7/27.5 with an MCV of 109.6 -Anemia panel was checked and showed an iron level of 35, UIBC 155, TIBC of 190, saturation ratios of 19, ferritin level 594, folate level 5.9, and vitamin B12 1176 -Continue to monitor for signs and symptoms of bleeding; no overt bleeding noted -Repeat CBC in a.m.   Hypertension -Continue Amlodipine 5 mg po Daily and Carvedilol 6.25 mg po BID -C/w Hydralazine 5 mg IV q4hprn HBP -Continue to Monitor BP per Protocol -Last BP reading was 123/62   Hypoalbuminemia -Patient's Albumin Level is now 2.7 -> 2.8 -Continue to Monitor and Trend and repeat CMP in the AM    Obesity -Complicates overall prognosis and care -Estimated body mass index is 38.74 kg/m as calculated from the following:   Height as of this encounter: '5\' 6"'$  (1.676 m).   Weight as of this encounter: 108.9 kg.  -Weight Loss and Dietary Counseling given   DVT prophylaxis: heparin injection 5,000 Units Start: 11/30/21 2245 SCDs Start: 11/30/21 2159    Code Status: Full Code Family Communication: No family currently at bedside  Disposition Plan:  Level of care: Telemetry Status is: Inpatient Remains inpatient appropriate because: Needs his Foley catheter fix and addressed prior to safe discharge disposition I will give him some Lasix.  Patient is improving slowly and mentation is better today but apparently nursing states that he sundown quite a bit last night   Consultants:  Urology  Procedures:  As delineated as above  Antimicrobials:  Anti-infectives (From admission, onward)    Start     Dose/Rate Route Frequency Ordered Stop   11/30/21 1600  cefTRIAXone (ROCEPHIN) 2 g in sodium chloride 0.9 % 100  mL IVPB  Status:  Discontinued        2 g 200 mL/hr over 30 Minutes Intravenous Every 24 hours 11/30/21 1546 12/01/21 1330       Subjective: Seen and examined at bedside and he is much more awake and alert and eating.  Had no issues.  Nursing states that overnight he became agitated and was sundowning and "punching the air".  No nausea or vomiting.  Feels relatively well.  Scrotum is still swelling and having issues with his catheter still.  Urology going to come by later today to address.  No other concerns or complaints at this time.  Objective: Vitals:   12/05/21 2057 12/06/21 0527 12/06/21 0655 12/06/21 1122  BP: (!) 145/79 (!) 165/84 130/68 123/62  Pulse: (!) 51 (!) 51 65 (!) 56  Resp: '18 18  19  '$ Temp: 97.6 F (36.4 C) 97.6 F (36.4 C)  97.6 F (36.4 C)  TempSrc: Oral Oral  Oral  SpO2: 100% 100% 98% 97%  Weight:      Height:        Intake/Output Summary (Last 24 hours) at 12/06/2021 1344 Last data filed at 12/06/2021 1009 Gross per 24 hour  Intake 420 ml  Output 0 ml  Net 420 ml   Filed Weights   11/30/21 1925  Weight: 108.9 kg   Examination: Physical Exam:  Constitutional: WN/WD obese African-American male who is pleasantly confused still but is improving his mentation Respiratory: Diminished to auscultation bilaterally with coarse breath sounds, no wheezing, rales, rhonchi or crackles. Normal respiratory effort and patient is not tachypenic. No accessory muscle use.  Unlabored breathing Cardiovascular: RRR, no murmurs / rubs / gallops. S1 and S2 auscultated.  Has 1+ lower EXTR pitting edema Abdomen: Soft, non-tender, distended secondary to body habitus. Bowel sounds positive.  GU: Significant penile and scrotal swelling with a Foley catheter in place but with no urine output in the Foley bag Musculoskeletal: No clubbing / cyanosis of digits/nails. No joint deformity upper and lower extremities.  Skin: No rashes, lesions, ulcers on limited skin evaluation. No  induration; Warm and dry.  Neurologic: Cranial nerves II through XII grossly intact he is much more awake and alert today not assessed.  Psychiatric: Continues to have impaired judgment and insight but is more awake and alert  Data Reviewed: I have personally reviewed following labs and imaging studies  CBC: Recent Labs  Lab 12/02/21 0357 12/03/21 0503 12/04/21 0656 12/05/21 0919 12/06/21 0507  WBC 7.3 7.3 6.2 5.9 4.7  NEUTROABS 6.0 5.8 4.6 4.2 3.2  HGB 8.8* 9.1* 8.7* 8.4* 8.7*  HCT 27.0* 28.5* 27.6* 25.9* 27.5*  MCV 107.1* 111.3* 110.4* 107.9* 109.6*  PLT 118* 140* 142* 141* 176   Basic Metabolic Panel: Recent Labs  Lab 12/02/21 0357 12/03/21 0503 12/04/21 0656 12/05/21 0919 12/06/21 0507  NA 146* 149* 147* 142 143  K 3.8 3.7 4.7 3.4* 3.9  CL 119* 117* 118* 116* 115*  CO2 20* 21* 20* 23 21*  GLUCOSE 155* 126* 107* 147* 104*  BUN 27* 24* '22 22 19  '$ CREATININE 1.48* 1.47* 1.26* 1.46* 1.33*  CALCIUM 8.6* 8.7* 8.5* 8.4* 8.5*  MG 2.0 2.1 2.3 2.0 2.4  PHOS  --   --   --  2.7 3.0   GFR: Estimated Creatinine Clearance: 41.9 mL/min (A) (by C-G formula based on SCr of 1.33 mg/dL (H)). Liver Function Tests: Recent Labs  Lab 11/30/21 1557 12/05/21 0919 12/06/21 0507  AST 74* 36 44*  ALT 83* 33 35  ALKPHOS 81 65 72  BILITOT 2.0* 0.9 0.8  PROT 7.1 6.1* 6.5  ALBUMIN 3.0* 2.7* 2.8*   No results for input(s): "LIPASE", "AMYLASE" in the last 168 hours. Recent Labs  Lab 11/30/21 1557  AMMONIA 22   Coagulation Profile: Recent Labs  Lab 11/30/21 1557  INR 1.5*   Cardiac Enzymes: No results for input(s): "CKTOTAL", "CKMB", "CKMBINDEX", "TROPONINI" in the last 168 hours. BNP (last 3 results) No results for input(s): "PROBNP" in the last 8760 hours. HbA1C: No results for input(s): "HGBA1C" in the last 72 hours. CBG: Recent Labs  Lab 12/01/21 2045 12/02/21 1009  GLUCAP 136* 144*   Lipid Profile: No results for input(s): "CHOL", "HDL", "LDLCALC", "TRIG",  "CHOLHDL", "LDLDIRECT" in the last 72 hours. Thyroid Function Tests: No results for input(s): "TSH", "T4TOTAL", "FREET4", "T3FREE", "THYROIDAB" in the last 72 hours. Anemia Panel: Recent Labs    12/05/21 0509 12/05/21 0919  VITAMINB12 1,176*  --   FOLATE 5.9*  --   FERRITIN 594*  --   TIBC 190*  --   IRON 35*  --   RETICCTPCT  --  2.5   Sepsis Labs: Recent Labs  Lab 11/30/21 1546  LATICACIDVEN 2.0*  Recent Results (from the past 240 hour(s))  Blood Culture (routine x 2)     Status: None   Collection Time: 11/30/21  3:57 PM   Specimen: BLOOD LEFT FOREARM  Result Value Ref Range Status   Specimen Description   Final    BLOOD LEFT FOREARM BOTTLES DRAWN AEROBIC AND ANAEROBIC   Special Requests Blood Culture adequate volume  Final   Culture   Final    NO GROWTH 5 DAYS Performed at [facility], [address] [place], [place] [address]    Report Status 12/05/2021 FINAL  Final  Resp Panel by RT-PCR (Flu A&B, Covid) Anterior Nasal Swab     Status: None   Collection Time: 11/30/21  4:13 PM   Specimen: Anterior Nasal Swab  Result Value Ref Range Status   SARS Coronavirus 2 by RT PCR NEGATIVE NEGATIVE Final    Comment: (NOTE) SARS-CoV-2 target nucleic acids are NOT DETECTED.  The SARS-CoV-2 RNA is generally detectable in upper respiratory specimens during the acute phase of infection. The lowest concentration of SARS-CoV-2 viral copies this assay can detect is 138 copies/mL. A negative result does not preclude SARS-Cov-2 infection and should not be used as the sole basis for treatment or other patient management decisions. A negative result may occur with  improper specimen collection/handling, submission of specimen other than nasopharyngeal swab, presence of viral mutation(s) within the areas targeted by this assay, and inadequate number of viral copies(<138 copies/mL). A negative result must be combined with clinical observations, patient history, and  epidemiological information. The expected result is Negative.  Fact Sheet for Patients:  EntrepreneurPulse.com.au  Fact Sheet for Healthcare Providers:  IncredibleEmployment.be  This test is no t yet approved or cleared by the [place] FDA and  has been authorized for detection and/or diagnosis of SARS-CoV-2 by FDA under an Emergency Use Authorization (EUA). This EUA will remain  in effect (meaning this test can be used) for the duration of the COVID-19 declaration under Section 564(b)(1) of the Act, 21 U.S.C.section 360bbb-3(b)(1), unless the authorization is terminated  or revoked sooner.       Influenza A by PCR NEGATIVE NEGATIVE Final   Influenza B by PCR NEGATIVE NEGATIVE Final    Comment: (NOTE) The Xpert Xpress SARS-CoV-2/FLU/RSV plus assay is intended as an aid in the diagnosis of influenza from Nasopharyngeal swab specimens and should not be used as a sole basis for treatment. Nasal washings and aspirates are unacceptable for Xpert Xpress SARS-CoV-2/FLU/RSV testing.  Fact Sheet for Patients: EntrepreneurPulse.com.au  Fact Sheet for Healthcare Providers: IncredibleEmployment.be  This test is not yet approved or cleared by the [place] FDA and has been authorized for detection and/or diagnosis of SARS-CoV-2 by FDA under an Emergency Use Authorization (EUA). This EUA will remain in effect (meaning this test can be used) for the duration of the COVID-19 declaration under Section 564(b)(1) of the Act, 21 U.S.C. section 360bbb-3(b)(1), unless the authorization is terminated or revoked.  Performed at [facility], [address] [place], [place] [address]   Blood Culture (routine x 2)     Status: None   Collection Time: 11/30/21  4:24 PM   Specimen: Right Antecubital; Blood  Result Value Ref Range Status   Specimen Description   Final    RIGHT ANTECUBITAL BOTTLES DRAWN AEROBIC AND  ANAEROBIC   Special Requests Blood Culture adequate volume  Final   Culture   Final    NO GROWTH 5 DAYS Performed at [facility], [place]  [address] [place], [place] [address]    Report Status 12/05/2021 FINAL  Final  Urine Culture     Status: None   Collection Time: 11/30/21  9:59 PM   Specimen: Urine, Catheterized  Result Value Ref Range Status   Specimen Description   Final    URINE, CATHETERIZED Performed at [place] [address] [place], [place] [address]    Special Requests   Final    NONE Performed at [facility], [place] [address] [place], [place] [address]    Culture   Final    NO GROWTH Performed at [facility], [place] [address] [place], [place] [address]    Report Status 12/02/2021 FINAL  Final    Radiology Studies: No results found.  Scheduled Meds:  amLODipine  5 mg Oral Daily   aspirin EC  81 mg Oral Daily   carvedilol  6.25 mg Oral BID   Chlorhexidine Gluconate Cloth  6 each Topical Q0600   cholecalciferol  1,000 Units Oral Daily   cyanocobalamin  1,000 mcg Oral Daily   heparin  5,000 Units Subcutaneous Q8H   latanoprost  1 drop Both Eyes QHS   tamsulosin  0.4 mg Oral Daily   Continuous Infusions:   LOS: 6 days   Raiford Noble, DO [facility] Available via Epic secure chat 7am-7pm After these hours, please refer to coverage provider listed on [email or web address] 12/06/2021, 1:44 PM

## 2021-12-06 NOTE — Progress Notes (Signed)
Physical Therapy Treatment Patient Details Name: Terrance Miller MRN: [id number] DOB: [birth year] Today's Date: 12/06/2021   History of Present Illness Patient is a 88 year old male who presented with altered mentation and difficulty with urination, Urology was consulted for Foley placement due to severe edema. recent hydrocele s/p hydrocelectomy 11/15. PMH CAD, DM II, HLD, HTN    PT Comments    Pt assisted OOB to recliner.  Pt more awake today and able to follows multimodal cues.  Pt HOH but following verbal cues when able to hear.  Pt prefers legs in abducted position due to scrotal edema and pain.  Once pt assisted to sitting EOB, he was able to stand and transfer to recliner with min assist (+2 for safety).  Continue to recommend SNF upon d/c.    Recommendations for follow up therapy are one component of a multi-disciplinary discharge planning process, led by the attending physician.  Recommendations may be updated based on patient status, additional functional criteria and insurance authorization.  Follow Up Recommendations  Skilled nursing-short term rehab (<3 hours/day) Can patient physically be transported by private vehicle: No   Assistance Recommended at Discharge Frequent or constant Supervision/Assistance  Patient can return home with the following A lot of help with walking and/or transfers;A lot of help with bathing/dressing/bathroom;Assist for transportation;Assistance with cooking/housework;Help with stairs or ramp for entrance   Equipment Recommendations  None recommended by PT    Recommendations for Other Services       Precautions / Restrictions Precautions Precautions: Fall Precaution Comments: severe scrotal edema/hydrocele, visual deficits? Restrictions Weight Bearing Restrictions: No     Mobility  Bed Mobility Overal bed mobility: Needs Assistance Bed Mobility: Supine to Sit Rolling: +2 for physical assistance, Max assist         General bed mobility  comments: pt initiating however having difficulty with LEs due to scrotal edema and pain, provided hand over assist to reach for rail and then assisted legs over EOB, utilized bed pad for positioning, pt also required assist to elevate trunk    Transfers Overall transfer level: Needs assistance Equipment used: Rolling walker (2 wheels) Transfers: Sit to/from Stand, Bed to chair/wheelchair/BSC Sit to Stand: Min assist, From elevated surface, +2 safety/equipment   Step pivot transfers: Min assist, +2 safety/equipment       General transfer comment: multimodal cues for technique, wide BOS due to edema, step by step cues for stepping over to recliner; pt declined ambulating yet (LEs elevated on pillows once in recliner) (also left maximove lift pad in chair as nursing reported difficulty assisting pt back to bed last time he was up in recliner)    Ambulation/Gait                   Stairs             Wheelchair Mobility    Modified Rankin (Stroke Patients Only)       Balance Overall balance assessment: Needs assistance Sitting-balance support: No upper extremity supported, Feet supported Sitting balance-Leahy Scale: Fair     Standing balance support: Reliant on assistive device for balance, Bilateral upper extremity supported, During functional activity Standing balance-Leahy Scale: Poor                              Cognition Arousal/Alertness: Awake/alert Behavior During Therapy: Flat affect Overall Cognitive Status: Within Functional Limits for tasks assessed  General Comments: Able to follow all commands today with visual cues.        Exercises      General Comments        Pertinent Vitals/Pain Pain Assessment Pain Assessment: Faces Faces Pain Scale: Hurts little more Pain Location: scrotum Pain Descriptors / Indicators: Grimacing, Guarding Pain Intervention(s): Repositioned, Monitored  during session    Home Living                          Prior Function            PT Goals (current goals can now be found in the care plan section) Progress towards PT goals: Progressing toward goals    Frequency    Min 2X/week      PT Plan Current plan remains appropriate    Co-evaluation PT/OT/SLP Co-Evaluation/Treatment: Yes Reason for Co-Treatment: For patient/therapist safety;To address functional/ADL transfers PT goals addressed during session: Mobility/safety with mobility OT goals addressed during session: ADL's and self-care      AM-PAC PT "6 Clicks" Mobility   Outcome Measure    Help needed moving from lying on your back to sitting on the side of a flat bed without using bedrails?: Total Help needed moving to and from a bed to a chair (including a wheelchair)?: Total Help needed standing up from a chair using your arms (e.g., wheelchair or bedside chair)?: A Lot Help needed to walk in hospital room?: Total Help needed climbing 3-5 steps with a railing? : Total 6 Click Score: 6    End of Session Equipment Utilized During Treatment: Gait belt Activity Tolerance: Patient tolerated treatment well Patient left: in chair;with call bell/phone within reach;with chair alarm set Nurse Communication: Mobility status PT Visit Diagnosis: Muscle weakness (generalized) (M62.81);Difficulty in walking, not elsewhere classified (R26.2)     Time: 9735-3299 PT Time Calculation (min) (ACUTE ONLY): 24 min  Charges:  $Therapeutic Activity: 8-22 mins                    Jannette Spanner PT, DPT Physical Therapist Acute Rehabilitation Services Preferred contact method: Secure Chat Weekend Pager Only: [phone] Office: [place] 12/06/2021, 4:04 PM

## 2021-12-06 NOTE — Progress Notes (Signed)
  Subjective: Foley not functioning, unable to irrigate and output coming around the catheter.  I was unable to pass wire through the catheter but I was able to manipulate sensor wire up the urethra into the bladder under digital feel.  Subsequently placed a new Coleman without difficulty.  Small amount of urine return.  10 cc placed in the balloon.  Irrigated Foley and drains well.  Objective: Vital signs in last 24 hours: Temp:  [97.6 F (36.4 C)] 97.6 F (36.4 C) (12/01 1122) Pulse Rate:  [51-65] 56 (12/01 1122) Resp:  [18-19] 19 (12/01 1122) BP: (123-165)/(62-84) 123/62 (12/01 1122) SpO2:  [97 %-100 %] 97 % (12/01 1122)  Intake/Output from previous day: 11/30 0701 - 12/01 0700 In: 420 [P.O.:420] Out: 0  Intake/Output this shift: Total I/O In: 240 [P.O.:240] Out: -   Physical Exam:  General: Alert and oriented GU: Continues to have open scrotal wound with local dressing over top.  Edema is slowly subsiding in the scrotum  Lab Results: Recent Labs    12/04/21 0656 12/05/21 0919 12/06/21 0507  HGB 8.7* 8.4* 8.7*  HCT 27.6* 25.9* 27.5*   BMET Recent Labs    12/05/21 0919 12/06/21 0507  NA 142 143  K 3.4* 3.9  CL 116* 115*  CO2 23 21*  GLUCOSE 147* 104*  BUN 22 19  CREATININE 1.46* 1.33*  CALCIUM 8.4* 8.5*     Studies/Results: No results found.  Assessment/Plan: 1.  Status post bilateral hydrocelectomy with superficial wound dehiscence 2.  Urinary retention with malfunctioning Foley now replaced and functioning well Recommendation: Keep Foley.  Local wound care to scrotum.    LOS: 6 days   JOBY RICHART 12/06/2021, 1:53 PM

## 2021-12-07 DIAGNOSIS — I2510 Atherosclerotic heart disease of native coronary artery without angina pectoris: Secondary | ICD-10-CM | POA: Diagnosis not present

## 2021-12-07 DIAGNOSIS — R338 Other retention of urine: Secondary | ICD-10-CM | POA: Diagnosis not present

## 2021-12-07 DIAGNOSIS — G9341 Metabolic encephalopathy: Secondary | ICD-10-CM | POA: Diagnosis not present

## 2021-12-07 DIAGNOSIS — N433 Hydrocele, unspecified: Secondary | ICD-10-CM | POA: Diagnosis not present

## 2021-12-07 LAB — COMPREHENSIVE METABOLIC PANEL
ALT: 40 U/L (ref 0–44)
AST: 50 U/L — ABNORMAL HIGH (ref 15–41)
Albumin: 2.7 g/dL — ABNORMAL LOW (ref 3.5–5.0)
Alkaline Phosphatase: 75 U/L (ref 38–126)
Anion gap: 6 (ref 5–15)
BUN: 23 mg/dL (ref 8–23)
CO2: 21 mmol/L — ABNORMAL LOW (ref 22–32)
Calcium: 8.5 mg/dL — ABNORMAL LOW (ref 8.9–10.3)
Chloride: 116 mmol/L — ABNORMAL HIGH (ref 98–111)
Creatinine, Ser: 1.56 mg/dL — ABNORMAL HIGH (ref 0.61–1.24)
GFR, Estimated: 42 mL/min — ABNORMAL LOW (ref >60)
Glucose, Bld: 129 mg/dL — ABNORMAL HIGH (ref 70–99)
Potassium: 4.1 mmol/L (ref 3.5–5.1)
Sodium: 143 mmol/L (ref 135–145)
Total Bilirubin: 0.7 mg/dL (ref 0.3–1.2)
Total Protein: 6.4 g/dL — ABNORMAL LOW (ref 6.5–8.1)

## 2021-12-07 LAB — CBC WITH DIFFERENTIAL/PLATELET
Abs Immature Granulocytes: 0.04 10*3/uL (ref 0.00–0.07)
Basophils Absolute: 0 10*3/uL (ref 0.0–0.1)
Basophils Relative: 0 %
Eosinophils Absolute: 0.2 10*3/uL (ref 0.0–0.5)
Eosinophils Relative: 3 %
HCT: 29.4 % — ABNORMAL LOW (ref 39.0–52.0)
Hemoglobin: 9 g/dL — ABNORMAL LOW (ref 13.0–17.0)
Immature Granulocytes: 1 %
Lymphocytes Relative: 13 %
Lymphs Abs: 0.6 10*3/uL — ABNORMAL LOW (ref 0.7–4.0)
MCH: 34.1 pg — ABNORMAL HIGH (ref 26.0–34.0)
MCHC: 30.6 g/dL (ref 30.0–36.0)
MCV: 111.4 fL — ABNORMAL HIGH (ref 80.0–100.0)
Monocytes Absolute: 0.7 10*3/uL (ref 0.1–1.0)
Monocytes Relative: 14 %
Neutro Abs: 3.2 10*3/uL (ref 1.7–7.7)
Neutrophils Relative %: 69 %
Platelets: 154 10*3/uL (ref 150–400)
RBC: 2.64 MIL/uL — ABNORMAL LOW (ref 4.22–5.81)
RDW: 21.5 % — ABNORMAL HIGH (ref 11.5–15.5)
WBC: 4.6 10*3/uL (ref 4.0–10.5)
nRBC: 0.9 % — ABNORMAL HIGH (ref 0.0–0.2)

## 2021-12-07 LAB — PHOSPHORUS: Phosphorus: 2.9 mg/dL (ref 2.5–4.6)

## 2021-12-07 LAB — MAGNESIUM: Magnesium: 2.2 mg/dL (ref 1.7–2.4)

## 2021-12-07 MED ORDER — FUROSEMIDE 10 MG/ML IJ SOLN
40.0000 mg | Freq: Once | INTRAMUSCULAR | Status: AC
  Administered 2021-12-07: 40 mg via INTRAVENOUS
  Filled 2021-12-07: qty 4

## 2021-12-07 NOTE — Progress Notes (Signed)
PROGRESS NOTE    Terrance Miller  LGX:[id number] DOB: [birth year] DOA: 11/30/2021 PCP: Coral Spikes, DO   Brief Narrative:  Terrance Miller is a 88 yo male with PMH recent hydrocele s/p hydrocelectomy 11/15, CAD, DM II, HLD, HTN who presented with altered mentation and difficulty with urination.  Patient had a Penrose drain after surgery which was discontinued prior to admission by daughter.  Due to developing difficulty with oral intake and worsening weakness and confusion he was brought to the hospital for further evaluation. Urology was consulted for Foley placement due to severe edema.    **Interim History His labs are slowly improving and he continues to have adequate urine output.  Foley was leaking a little bit this morning and urology feels this is related to bladder spasm but they feel that it Foley still draining adequately. Will resume IV fluids with D5W given his hypernatremia but D5W will be stopping today.  PT OT recommending SNF and his labs are improving but mentation is still about the same and he did sundown last night.  Had issues with his Foley catheter and so now urology is going to be evaluating.  Will give him some Lasix again given his swelling to monitor his renal function carefully  Assessment and Plan:  Acute metabolic encephalopathy, slowly improving -Most likely in the setting of hypernatremia, dehydration; chronic component? -CT head with no acute intracranial process identified -DC fluids for now given severe scrotal edema I will restart the D5W for short-term which is now stopped  -Continue to hold Seroquel -UA negative for evidence of infection -Mentation is improving slowly and will need to continue monitor closely -Fluids should be discontinued yesterday and will try some IV Lasix again today given swelling -Placed on Delirium Precautions  -Patient's mentation improved to be improving daily. -Will need to be up and out of bed daily   B/L large Hydrocele status  post hydrocelectomy Urinary Retention -S/p bilateral hydrocelectomy by Dr. Felipa Eth 11/15 -Foley placed by urology on admission due to severe scrotal edema -Foley placed 11/25 and is recommended to remain in place until edema resolves and outpatient followup -UA negative for signs of infection after catheter placed Therefore discontinued IV Ceftriaxone at this time -Appears to have some dehiscence of wound with fluid leak noted on 12/02/2021.   -Foley continues to leak and right bladder scan shows 0 and he is 0 output in the bag; urology has been notified and they are recommending obtaining it to be strange and saline for irrigation and they will get a Foley catheter evaluate during lunchtime: Foley catheter was replaced yesterday by urology; urology reevaluated and recommending instructing nursing staff to contact wound care to change dressings as he does not.  Maintain the last 48 hours they are recommending wet-to-dry dressing and they are recommending keeping the scrotum elevated and keeping Foley catheter in place given his extensive penile and scrotal edema -My colleague has spoke with the Urological Team  -Not large enough to pack yet so continue cleansing and covering with dry gauze and local wound care with dressing -Patient is to follow-up with Dr. Felipa Eth in outpatient setting -PT OT recommending SNF once medically stable and improved   Hypernatremia, improving Hyperchloremia improving -Some improvement with fluids -Na+ went from 156 -> 146 -> 149 -> 147 -> 142 yesterday and is now 143 again today  -Chloride level is 116 now -Was placed on D5W but is now stopped as sodium is improved   Hypokalemia -Patient's K+ is now  gone from 4.7 -> 3.4 -> 3.9 -> 4.1 -Continue to monitor replete as necessary and repeat CMP in the   Abnormal LFTs/Transaminitis -Likely due to above, continue to trend and repeat LFTs in the AM  -AST went from 36 -> 44 -> 50 and ALT is gone from 33 -> 35 ->  40 -Continue to Hold Pravastatin   AKI on CKD stage IIIa Hyperchloremic metabolic Acidosis -Continue with IV fluids as above -Patient's BUN/Cr went from 32/1.77 -> 27/1.48 -> 24/1.47 -> 22/1.26 -> 22/1.46 and is further improved to 19/1.33 yesterday but slightly went up in the setting of IV Lasix and is now 23/1.56 -Patient has a slight metabolic acidosis with a CO2 of 21, anion gap of 6, chloride level of 116 -Avoid nephrotoxic medications, contrast dyes, hypotension and dehydration to ensure adequate renal perfusion and renally adjust medications -Continue monitor and trend and repeat CMP in the AM    Hyperlipidemia LDL goal <70 -Pravastatin 40 mg po Daily currently held due to Abnormal LFTs -AST is now 50 and ALT is now 40   Macrocytic Anemia -Patient's Hemoglobin/hematocrit is gone from 9.1/28.5 -> 8.8/27.0 -> 9.1/28.5 -> 8.7/27.6 -> 8.4/25.9 -> 8.7/27.5 -> 9.0/29.4 with an MCV of 109.6 -Anemia panel was checked and showed an iron level of 35, UIBC 155, TIBC of 190, saturation ratios of 19, ferritin level 594, folate level 5.9, and vitamin B12 1176 -Continue to monitor for signs and symptoms of bleeding; no overt bleeding noted -Repeat CBC in a.m.   Hypertension -Continue Amlodipine 5 mg po Daily and Carvedilol 6.25 mg po BID -C/w Hydralazine 5 mg IV q4hprn HBP -Continue to Monitor BP per Protocol -Last BP reading was 148/81   Hypoalbuminemia -Patient's Albumin Level is now 2.7 -> 2.8 -> 2.7 -Continue to Monitor and Trend and repeat CMP in the AM    Obesity -Complicates overall prognosis and care -Estimated body mass index is 38.74 kg/m as calculated from the following:   Height as of this encounter: '5\' 6"'$  (1.676 m).   Weight as of this encounter: 108.9 kg.  -Weight Loss and Dietary Counseling given  DVT prophylaxis: heparin injection 5,000 Units Start: 11/30/21 2245 SCDs Start: 11/30/21 2159    Code Status: Full Code Family Communication: No family present at bedside    Disposition Plan:  Level of care: Telemetry Status is: Inpatient Remains inpatient appropriate because: Needs further clinical improvement and will go to SNF once he is cleared by urology   Consultants:  Urology  Procedures:  As delineated as above  Antimicrobials:  Anti-infectives (From admission, onward)    Start     Dose/Rate Route Frequency Ordered Stop   11/30/21 1600  cefTRIAXone (ROCEPHIN) 2 g in sodium chloride 0.9 % 100 mL IVPB  Status:  Discontinued        2 g 200 mL/hr over 30 Minutes Intravenous Every 24 hours 11/30/21 1546 12/01/21 1330        Subjective: Seen and examined at bedside he is much more awake and not as confused.  Continues to have significant swelling especially in his scrotum and his lower extremity as well as penile swelling.  He denies any current complaint denies shortness of breath or nausea.  No other concerns or complaints at this time.  Objective: Vitals:   12/06/21 1122 12/06/21 2210 12/07/21 0651 12/07/21 1338  BP: 123/62 111/62 (!) 154/83 (!) 148/81  Pulse: (!) 56 (!) 55 (!) 59 (!) 54  Resp: '19 18 17 18  '$ Temp: 97.6  F (36.4 C) (!) 97.3 F (36.3 C) (!) 97.5 F (36.4 C) 98 F (36.7 C)  TempSrc: Oral Oral Oral Oral  SpO2: 97% 99% 100% 98%  Weight:      Height:        Intake/Output Summary (Last 24 hours) at 12/07/2021 1711 Last data filed at 12/07/2021 1300 Gross per 24 hour  Intake 480 ml  Output 1600 ml  Net -1120 ml   Filed Weights   11/30/21 1925  Weight: 108.9 kg   Examination: Physical Exam:  Constitutional: WN/WD obese elderly African-American male currently no acute distress Respiratory: Diminished to auscultation bilaterally, no wheezing, rales, rhonchi or crackles. Normal respiratory effort and patient is not tachypenic. No accessory muscle use.  Unlabored breathing Cardiovascular: RRR, no murmurs / rubs / gallops. S1 and S2 auscultated.  Has 1+ lower extremity edema Abdomen: Soft, non-tender, distended  secondary to body habitus. Bowel sounds positive.  GU: Deferred.  Significant penile swelling and scrotal swelling and has a Foley catheter in place Musculoskeletal: No clubbing / cyanosis of digits/nails. No joint deformity upper and lower extremities. Skin: No rashes, lesions, ulcers on limited skin evaluation. No induration; Warm and dry.  Neurologic: CN 2-12 grossly intact with no focal deficits. Romberg sign and cerebellar reflexes not assessed.  Psychiatric: Still slightly confused but and impaired judgment and insight. Alert and oriented x 2. Normal mood and appropriate affect.   Data Reviewed: I have personally reviewed following labs and imaging studies  CBC: Recent Labs  Lab 12/03/21 0503 12/04/21 0656 12/05/21 0919 12/06/21 0507 12/07/21 0522  WBC 7.3 6.2 5.9 4.7 4.6  NEUTROABS 5.8 4.6 4.2 3.2 3.2  HGB 9.1* 8.7* 8.4* 8.7* 9.0*  HCT 28.5* 27.6* 25.9* 27.5* 29.4*  MCV 111.3* 110.4* 107.9* 109.6* 111.4*  PLT 140* 142* 141* 151 053   Basic Metabolic Panel: Recent Labs  Lab 12/03/21 0503 12/04/21 0656 12/05/21 0919 12/06/21 0507 12/07/21 0522  NA 149* 147* 142 143 143  K 3.7 4.7 3.4* 3.9 4.1  CL 117* 118* 116* 115* 116*  CO2 21* 20* 23 21* 21*  GLUCOSE 126* 107* 147* 104* 129*  BUN 24* '22 22 19 23  '$ CREATININE 1.47* 1.26* 1.46* 1.33* 1.56*  CALCIUM 8.7* 8.5* 8.4* 8.5* 8.5*  MG 2.1 2.3 2.0 2.4 2.2  PHOS  --   --  2.7 3.0 2.9   GFR: Estimated Creatinine Clearance: 35.7 mL/min (A) (by C-G formula based on SCr of 1.56 mg/dL (H)). Liver Function Tests: Recent Labs  Lab 12/05/21 0919 12/06/21 0507 12/07/21 0522  AST 36 44* 50*  ALT 33 35 40  ALKPHOS 65 72 75  BILITOT 0.9 0.8 0.7  PROT 6.1* 6.5 6.4*  ALBUMIN 2.7* 2.8* 2.7*   No results for input(s): "LIPASE", "AMYLASE" in the last 168 hours. No results for input(s): "AMMONIA" in the last 168 hours. Coagulation Profile: No results for input(s): "INR", "PROTIME" in the last 168 hours. Cardiac Enzymes: No  results for input(s): "CKTOTAL", "CKMB", "CKMBINDEX", "TROPONINI" in the last 168 hours. BNP (last 3 results) No results for input(s): "PROBNP" in the last 8760 hours. HbA1C: No results for input(s): "HGBA1C" in the last 72 hours. CBG: Recent Labs  Lab 12/01/21 2045 12/02/21 1009  GLUCAP 136* 144*   Lipid Profile: No results for input(s): "CHOL", "HDL", "LDLCALC", "TRIG", "CHOLHDL", "LDLDIRECT" in the last 72 hours. Thyroid Function Tests: No results for input(s): "TSH", "T4TOTAL", "FREET4", "T3FREE", "THYROIDAB" in the last 72 hours. Anemia Panel: Recent Labs  12/05/21 0509 12/05/21 0919  VITAMINB12 1,176*  --   FOLATE 5.9*  --   FERRITIN 594*  --   TIBC 190*  --   IRON 35*  --   RETICCTPCT  --  2.5   Sepsis Labs: No results for input(s): "PROCALCITON", "LATICACIDVEN" in the last 168 hours.  Recent Results (from the past 240 hour(s))  Blood Culture (routine x 2)     Status: None   Collection Time: 11/30/21  3:57 PM   Specimen: BLOOD LEFT FOREARM  Result Value Ref Range Status   Specimen Description   Final    BLOOD LEFT FOREARM BOTTLES DRAWN AEROBIC AND ANAEROBIC   Special Requests Blood Culture adequate volume  Final   Culture   Final    NO GROWTH 5 DAYS Performed at [facility], [address] [place], [place] [address]    Report Status 12/05/2021 FINAL  Final  Resp Panel by RT-PCR (Flu A&B, Covid) Anterior Nasal Swab     Status: None   Collection Time: 11/30/21  4:13 PM   Specimen: Anterior Nasal Swab  Result Value Ref Range Status   SARS Coronavirus 2 by RT PCR NEGATIVE NEGATIVE Final    Comment: (NOTE) SARS-CoV-2 target nucleic acids are NOT DETECTED.  The SARS-CoV-2 RNA is generally detectable in upper respiratory specimens during the acute phase of infection. The lowest concentration of SARS-CoV-2 viral copies this assay can detect is 138 copies/mL. A negative result does not preclude SARS-Cov-2 infection and should not be used as the sole basis for  treatment or other patient management decisions. A negative result may occur with  improper specimen collection/handling, submission of specimen other than nasopharyngeal swab, presence of viral mutation(s) within the areas targeted by this assay, and inadequate number of viral copies(<138 copies/mL). A negative result must be combined with clinical observations, patient history, and epidemiological information. The expected result is Negative.  Fact Sheet for Patients:  EntrepreneurPulse.com.au  Fact Sheet for Healthcare Providers:  IncredibleEmployment.be  This test is no t yet approved or cleared by the [place] FDA and  has been authorized for detection and/or diagnosis of SARS-CoV-2 by FDA under an Emergency Use Authorization (EUA). This EUA will remain  in effect (meaning this test can be used) for the duration of the COVID-19 declaration under Section 564(b)(1) of the Act, 21 U.S.C.section 360bbb-3(b)(1), unless the authorization is terminated  or revoked sooner.       Influenza A by PCR NEGATIVE NEGATIVE Final   Influenza B by PCR NEGATIVE NEGATIVE Final    Comment: (NOTE) The Xpert Xpress SARS-CoV-2/FLU/RSV plus assay is intended as an aid in the diagnosis of influenza from Nasopharyngeal swab specimens and should not be used as a sole basis for treatment. Nasal washings and aspirates are unacceptable for Xpert Xpress SARS-CoV-2/FLU/RSV testing.  Fact Sheet for Patients: EntrepreneurPulse.com.au  Fact Sheet for Healthcare Providers: IncredibleEmployment.be  This test is not yet approved or cleared by the [place] FDA and has been authorized for detection and/or diagnosis of SARS-CoV-2 by FDA under an Emergency Use Authorization (EUA). This EUA will remain in effect (meaning this test can be used) for the duration of the COVID-19 declaration under Section 564(b)(1) of the Act, 21  U.S.C. section 360bbb-3(b)(1), unless the authorization is terminated or revoked.  Performed at [facility], [address] [place], [place] [address]   Blood Culture (routine x 2)     Status: None   Collection Time: 11/30/21  4:24 PM   Specimen:  Right Antecubital; Blood  Result Value Ref Range Status   Specimen Description   Final    RIGHT ANTECUBITAL BOTTLES DRAWN AEROBIC AND ANAEROBIC   Special Requests Blood Culture adequate volume  Final   Culture   Final    NO GROWTH 5 DAYS Performed at [facility], [address], [place], [place] [address]    Report Status 12/05/2021 FINAL  Final  Urine Culture     Status: None   Collection Time: 11/30/21  9:59 PM   Specimen: Urine, Catheterized  Result Value Ref Range Status   Specimen Description   Final    URINE, CATHETERIZED Performed at [facility] [address], [place], [place] [address]    Special Requests   Final    NONE Performed at [facility], [place] [address], [place], [place] [address]    Culture   Final    NO GROWTH Performed at [facility], [place] [address], [place], [place] [address]    Report Status 12/02/2021 FINAL  Final    Radiology Studies: No results found.  Scheduled Meds:  amLODipine  5 mg Oral Daily   aspirin EC  81 mg Oral Daily   carvedilol  6.25 mg Oral BID   Chlorhexidine Gluconate Cloth  6 each Topical Q0600   cholecalciferol  1,000 Units Oral Daily   cyanocobalamin  1,000 mcg Oral Daily   folic acid  1 mg Oral Daily   heparin  5,000 Units Subcutaneous Q8H   latanoprost  1 drop Both Eyes QHS   tamsulosin  0.4 mg Oral Daily   Continuous Infusions:   LOS: 7 days   Raiford Noble, DO [facility] Available via Epic secure chat 7am-7pm After these hours, please refer to coverage provider listed on [email or web address] 12/07/2021, 5:11 PM

## 2021-12-07 NOTE — Progress Notes (Signed)
  Subjective: No acute events overnight.  Patient has no complaints this morning.  Objective: Vital signs in last 24 hours: Temp:  [97.3 F (36.3 C)-97.6 F (36.4 C)] 97.5 F (36.4 C) (12/02 0651) Pulse Rate:  [55-59] 59 (12/02 0651) Resp:  [17-19] 17 (12/02 0651) BP: (111-154)/(62-83) 154/83 (12/02 0651) SpO2:  [97 %-100 %] 100 % (12/02 0651)  Intake/Output from previous day: 12/01 0701 - 12/02 0700 In: 720 [P.O.:720] Out: 1300 [Urine:1300]  Intake/Output this shift: No intake/output data recorded.  Physical Exam:  General: Alert and oriented GU: Extensive penoscrotal edema with mid scrotal wound dehiscence.  Superficial fibrinous tissue overlying scrotal wound with no signs of purulent output or spreading erythema/cellulitis.  Foley catheter in place and draining yellow urine  Lab Results: Recent Labs    12/05/21 0919 12/06/21 0507 12/07/21 0522  HGB 8.4* 8.7* 9.0*  HCT 25.9* 27.5* 29.4*   BMET Recent Labs    12/06/21 0507 12/07/21 0522  NA 143 143  K 3.9 4.1  CL 115* 116*  CO2 21* 21*  GLUCOSE 104* 129*  BUN 19 23  CREATININE 1.33* 1.56*  CALCIUM 8.5* 8.5*     Studies/Results: No results found.  Assessment/Plan: 88 year old male with wound dehiscence following bilateral hydrocelectomy with Dr. Felipa Eth and urinary retention  -Nursing staff instructed to contact wound care to change his dressing as it does not appear to have been changed in the past 48 hours.  Recommend wet-to-dry dressing. -I spoke with the nursing staff about the importance of keeping his scrotum elevated to help reduce his extensive penoscrotal edema -Keep Foley catheter in place   LOS: 7 days   Ellison Hughs, MD [facility] Pager: [phone]  12/07/2021, 10:54 AM

## 2021-12-07 NOTE — Progress Notes (Signed)
RN provided WOC-directed instructions for cleaning and dressing patient's scrotal wound.   RN ensured scrotum was elevated to help reduce edema, per MD instructions. Will continue to monitor throughout the shift.

## 2021-12-08 DIAGNOSIS — G9341 Metabolic encephalopathy: Secondary | ICD-10-CM | POA: Diagnosis not present

## 2021-12-08 DIAGNOSIS — N433 Hydrocele, unspecified: Secondary | ICD-10-CM | POA: Diagnosis not present

## 2021-12-08 DIAGNOSIS — R338 Other retention of urine: Secondary | ICD-10-CM | POA: Diagnosis not present

## 2021-12-08 DIAGNOSIS — I2510 Atherosclerotic heart disease of native coronary artery without angina pectoris: Secondary | ICD-10-CM | POA: Diagnosis not present

## 2021-12-08 LAB — COMPREHENSIVE METABOLIC PANEL
ALT: 42 U/L (ref 0–44)
AST: 58 U/L — ABNORMAL HIGH (ref 15–41)
Albumin: 2.7 g/dL — ABNORMAL LOW (ref 3.5–5.0)
Alkaline Phosphatase: 80 U/L (ref 38–126)
Anion gap: 6 (ref 5–15)
BUN: 21 mg/dL (ref 8–23)
CO2: 22 mmol/L (ref 22–32)
Calcium: 8.8 mg/dL — ABNORMAL LOW (ref 8.9–10.3)
Chloride: 114 mmol/L — ABNORMAL HIGH (ref 98–111)
Creatinine, Ser: 1.54 mg/dL — ABNORMAL HIGH (ref 0.61–1.24)
GFR, Estimated: 42 mL/min — ABNORMAL LOW (ref >60)
Glucose, Bld: 105 mg/dL — ABNORMAL HIGH (ref 70–99)
Potassium: 4 mmol/L (ref 3.5–5.1)
Sodium: 142 mmol/L (ref 135–145)
Total Bilirubin: 0.7 mg/dL (ref 0.3–1.2)
Total Protein: 6.6 g/dL (ref 6.5–8.1)

## 2021-12-08 LAB — CBC WITH DIFFERENTIAL/PLATELET
Abs Immature Granulocytes: 0.03 10*3/uL (ref 0.00–0.07)
Basophils Absolute: 0 10*3/uL (ref 0.0–0.1)
Basophils Relative: 0 %
Eosinophils Absolute: 0.1 10*3/uL (ref 0.0–0.5)
Eosinophils Relative: 3 %
HCT: 31.2 % — ABNORMAL LOW (ref 39.0–52.0)
Hemoglobin: 9.9 g/dL — ABNORMAL LOW (ref 13.0–17.0)
Immature Granulocytes: 1 %
Lymphocytes Relative: 18 %
Lymphs Abs: 0.8 10*3/uL (ref 0.7–4.0)
MCH: 34.6 pg — ABNORMAL HIGH (ref 26.0–34.0)
MCHC: 31.7 g/dL (ref 30.0–36.0)
MCV: 109.1 fL — ABNORMAL HIGH (ref 80.0–100.0)
Monocytes Absolute: 0.7 10*3/uL (ref 0.1–1.0)
Monocytes Relative: 15 %
Neutro Abs: 2.9 10*3/uL (ref 1.7–7.7)
Neutrophils Relative %: 63 %
Platelets: 168 10*3/uL (ref 150–400)
RBC: 2.86 MIL/uL — ABNORMAL LOW (ref 4.22–5.81)
RDW: 21.6 % — ABNORMAL HIGH (ref 11.5–15.5)
WBC: 4.6 10*3/uL (ref 4.0–10.5)
nRBC: 0.7 % — ABNORMAL HIGH (ref 0.0–0.2)

## 2021-12-08 LAB — GLUCOSE, CAPILLARY: Glucose-Capillary: 94 mg/dL (ref 70–99)

## 2021-12-08 LAB — PHOSPHORUS: Phosphorus: 2.9 mg/dL (ref 2.5–4.6)

## 2021-12-08 LAB — MAGNESIUM: Magnesium: 2.1 mg/dL (ref 1.7–2.4)

## 2021-12-08 MED ORDER — FUROSEMIDE 10 MG/ML IJ SOLN
40.0000 mg | Freq: Once | INTRAMUSCULAR | Status: AC
  Administered 2021-12-08: 40 mg via INTRAVENOUS
  Filled 2021-12-08: qty 4

## 2021-12-08 NOTE — Progress Notes (Signed)
PROGRESS NOTE    Jr Milliron  GGY:[id number] DOB: 1928-02-01 DOA: 11/30/2021 PCP: Coral Spikes, DO   Brief Narrative:  Mr. Hollars is a 88 yo male with PMH recent hydrocele s/p hydrocelectomy 11/15, CAD, DM II, HLD, HTN who presented with altered mentation and difficulty with urination.  Patient had a Penrose drain after surgery which was discontinued prior to admission by daughter.  Due to developing difficulty with oral intake and worsening weakness and confusion he was brought to the hospital for further evaluation. Urology was consulted for Foley placement due to severe edema.    **Interim History His labs are slowly improving and he continues to have adequate urine output.  Foley was leaking a little bit this morning and urology feels this is related to bladder spasm but they feel that it Foley still draining adequately. Will resume IV fluids with D5W given his hypernatremia but D5W will be stopping today.  PT OT recommending SNF and his labs are improving but mentation is still about the same and he did sundown last night.  Had issues with his Foley catheter and so now urology is going to be evaluating.  Will give him some Lasix again given his swelling to monitor his renal function carefully  Assessment and Plan:  Acute Metabolic Encephalopathy, slowly improving -Most likely in the setting of hypernatremia, dehydration; chronic component? -CT head with no acute intracranial process identified -DC fluids for now given severe scrotal edema I will restart the D5W for short-term which is now stopped  -Continue to hold Seroquel -UA negative for evidence of infection -Mentation is improving slowly and will need to continue monitor closely -Fluids should be discontinued yesterday and will try some IV Lasix again today given swelling (3rd Day in a row for IV Lasix) -Placed on Delirium Precautions  -Patient's mentation improved to be improving daily. -Will need to be up and out of bed  daily   B/L large Hydrocele status post hydrocelectomy Urinary Retention -S/p bilateral hydrocelectomy by Dr. Felipa Eth 11/15 -Foley placed by urology on admission due to severe scrotal edema -Foley placed 11/25 and is recommended to remain in place until edema resolves and outpatient followup -UA negative for signs of infection after catheter placed Therefore discontinued IV Ceftriaxone at this time -Appears to have some dehiscence of wound with fluid leak noted on 12/02/2021.   -Foley continues to leak and right bladder scan shows 0 and he is 0 output in the bag; urology has been notified and they are recommending obtaining it to be strange and saline for irrigation and they will get a Foley catheter evaluate during lunchtime: Foley catheter was replaced yesterday by urology; urology reevaluated and recommending instructing nursing staff to contact wound care to change dressings as he does not.  Maintain the last 48 hours they are recommending wet-to-dry dressing and they are recommending keeping the scrotum elevated and keeping Foley catheter in place given his extensive penile and scrotal edema -Strict I's and O's and Daily Weights; Patient is -1.446 Liters -My colleague has spoke with the Urological Team  -Not large enough to pack yet so continue cleansing and covering with dry gauze and local wound care with dressing -Patient is to follow-up with Dr. Felipa Eth in outpatient setting -PT OT recommending SNF once medically stable and improved   Hypernatremia, improved Hyperchloremia improving -Some improvement with fluids -Na+ went from 156 and is now stable at 142 -Chloride level is 114 now -Was placed on D5W but is now stopped as sodium  is improved   Hypokalemia -Patient's K+ is now gone from 4.7 -> 3.4 -> 3.9 -> 4.1 -> 4.0 -Continue to monitor replete as necessary and repeat CMP in the   Abnormal LFTs/Transaminitis -Likely due to above, continue to trend and repeat LFTs in the AM   -AST went from 36 -> 44 -> 50 -> 58 and ALT is gone from 33 -> 35 -> 40 -> 42 -Continue to Hold Pravastatin   AKI on CKD stage IIIa Hyperchloremic metabolic Acidosis -Continue with IV fluids as above -Patient's BUN/Cr went from 32/1.77 -> 27/1.48 -> 24/1.47 -> 22/1.26 -> 22/1.46 and is further improved to 19/1.33 yesterday but slightly went up in the setting of IV Lasix and is now 23/1.56 yesterday and today is now stable at 21/1.54 -Patient has a slight metabolic acidosis with a CO2 of 22, anion gap of 6, chloride level of 114 -Avoid nephrotoxic medications, contrast dyes, hypotension and dehydration to ensure adequate renal perfusion and renally adjust medications -Continue monitor and trend and repeat CMP in the AM    Hyperlipidemia LDL goal <70 -Pravastatin 40 mg po Daily currently held due to Abnormal LFTs -AST is now 50 -> 58 and ALT is now 40 -> 42 -Continue monitor and trend LFTs carefully and repeat CMP in the a.m.   Macrocytic Anemia -Patient's Hemoglobin/hematocrit is gone from 9.1/28.5 -> 8.8/27.0 -> 9.1/28.5 -> 8.7/27.6 -> 8.4/25.9 -> 8.7/27.5 -> 9.0/29.4 -> 9.9/31.2 with an MCV of 109.6 -Anemia panel was checked and showed an iron level of 35, UIBC 155, TIBC of 190, saturation ratios of 19, ferritin level 594, folate level 5.9, and vitamin B12 1176 -Continue to monitor for signs and symptoms of bleeding; no overt bleeding noted -Repeat CBC in a.m.   Hypertension -Continue Amlodipine 5 mg po Daily and Carvedilol 6.25 mg po BID -C/w Hydralazine 5 mg IV q4hprn HBP -Continue to Monitor BP per Protocol -Last BP reading was 124/66   Hypoalbuminemia -Patient's Albumin Level is now 2.7 -> 2.8 -> 2.7 x2 -Continue to Monitor and Trend and repeat CMP in the AM    Obesity -Complicates overall prognosis and care -Estimated body mass index is 38.74 kg/m as calculated from the following:   Height as of this encounter: '5\' 6"'$  (1.676 m).   Weight as of this encounter: 108.9 kg.   -Weight Loss and Dietary Counseling given   DVT prophylaxis: heparin injection 5,000 Units Start: 11/30/21 2245 SCDs Start: 11/30/21 2159    Code Status: Full Code Family Communication: No family currently at bedside  Disposition Plan:  Level of care: Telemetry Status is: Inpatient Remains inpatient appropriate because: Is continuing to be diuresed and needs SNF and bed offers have been given to the patient's daughter and still awaiting decision and insurance authorization   Consultants:  Urology  Procedures:  As delineated as above  Antimicrobials:  Anti-infectives (From admission, onward)    Start     Dose/Rate Route Frequency Ordered Stop   11/30/21 1600  cefTRIAXone (ROCEPHIN) 2 g in sodium chloride 0.9 % 100 mL IVPB  Status:  Discontinued        2 g 200 mL/hr over 30 Minutes Intravenous Every 24 hours 11/30/21 1546 12/01/21 1330       Subjective: Seen and examined at bedside he is awake and alert and sitting in the chair not as confused.  Continues to have significant swelling complaining some pain in his scrotum.  Denies any nausea or vomiting.  No shortness of breath.  Legs  are still very swollen as well as his scrotum.  No other concerns or complaints at this time.  Objective: Vitals:   12/07/21 2139 12/08/21 0433 12/08/21 0627 12/08/21 1145  BP: (!) 162/89 (!) 164/83 127/69 124/66  Pulse: (!) 56 (!) 55 (!) 54 (!) 50  Resp: '19 20  18  '$ Temp: (!) 97.5 F (36.4 C) (!) 97.4 F (36.3 C)  (!) 97.5 F (36.4 C)  TempSrc: Oral Oral  Oral  SpO2: 100% 100% 99% 99%  Weight:      Height:        Intake/Output Summary (Last 24 hours) at 12/08/2021 1552 Last data filed at 12/08/2021 1500 Gross per 24 hour  Intake 360 ml  Output 3025 ml  Net -2665 ml   Filed Weights   11/30/21 1925  Weight: 108.9 kg   Examination: Physical Exam:  Constitutional: WN/WD obese elderly African-American male currently no acute distress Respiratory: Diminished to auscultation  bilaterally with coarse breath sounds, no wheezing, rales, rhonchi or crackles. Normal respiratory effort and patient is not tachypenic. No accessory muscle use.  Unlabored breathing and not wearing supplemental oxygen via nasal cannula Cardiovascular: RRR, no murmurs / rubs / gallops. S1 and S2 auscultated.  He has 1+ lower extremity edema Abdomen: Soft, non-tender, distended secondary to body habitus. Bowel sounds positive.  GU: Has significant penile and scrotal swelling and has a Foley catheter in place Musculoskeletal: No clubbing / cyanosis of digits/nails. No joint deformity upper and lower extremities.   Skin: No rashes, lesions, ulcers on limited skin evaluation. No induration; Warm and dry.  Neurologic: CN 2-12 grossly intact with no focal deficits. Sensation intact in all 4 Extremities, Romberg sign and cerebellar reflexes not assessed.  Psychiatric: Remains slightly confused and has impaired judgment insight but is awake and alert and oriented x 2 and thinks that he is in [place]: I have personally reviewed following labs and imaging studies  CBC: Recent Labs  Lab 12/04/21 0656 12/05/21 0919 12/06/21 0507 12/07/21 0522 12/08/21 0418  WBC 6.2 5.9 4.7 4.6 4.6  NEUTROABS 4.6 4.2 3.2 3.2 2.9  HGB 8.7* 8.4* 8.7* 9.0* 9.9*  HCT 27.6* 25.9* 27.5* 29.4* 31.2*  MCV 110.4* 107.9* 109.6* 111.4* 109.1*  PLT 142* 141* 151 154 355   Basic Metabolic Panel: Recent Labs  Lab 12/04/21 0656 12/05/21 0919 12/06/21 0507 12/07/21 0522 12/08/21 0418  NA 147* 142 143 143 142  K 4.7 3.4* 3.9 4.1 4.0  CL 118* 116* 115* 116* 114*  CO2 20* 23 21* 21* 22  GLUCOSE 107* 147* 104* 129* 105*  BUN '22 22 19 23 21  '$ CREATININE 1.26* 1.46* 1.33* 1.56* 1.54*  CALCIUM 8.5* 8.4* 8.5* 8.5* 8.8*  MG 2.3 2.0 2.4 2.2 2.1  PHOS  --  2.7 3.0 2.9 2.9   GFR: Estimated Creatinine Clearance: 36.1 mL/min (A) (by C-G formula based on SCr of 1.54 mg/dL (H)). Liver Function  Tests: Recent Labs  Lab 12/05/21 0919 12/06/21 0507 12/07/21 0522 12/08/21 0418  AST 36 44* 50* 58*  ALT 33 35 40 42  ALKPHOS 65 72 75 80  BILITOT 0.9 0.8 0.7 0.7  PROT 6.1* 6.5 6.4* 6.6  ALBUMIN 2.7* 2.8* 2.7* 2.7*   No results for input(s): "LIPASE", "AMYLASE" in the last 168 hours. No results for input(s): "AMMONIA" in the last 168 hours. Coagulation Profile: No results for input(s): "INR", "PROTIME" in the last 168 hours. Cardiac Enzymes: No results for input(s): "CKTOTAL", "CKMB", "CKMBINDEX", "  TROPONINI" in the last 168 hours. BNP (last 3 results) No results for input(s): "PROBNP" in the last 8760 hours. HbA1C: No results for input(s): "HGBA1C" in the last 72 hours. CBG: Recent Labs  Lab 12/01/21 2045 12/02/21 1009  GLUCAP 136* 144*   Lipid Profile: No results for input(s): "CHOL", "HDL", "LDLCALC", "TRIG", "CHOLHDL", "LDLDIRECT" in the last 72 hours. Thyroid Function Tests: No results for input(s): "TSH", "T4TOTAL", "FREET4", "T3FREE", "THYROIDAB" in the last 72 hours. Anemia Panel: No results for input(s): "VITAMINB12", "FOLATE", "FERRITIN", "TIBC", "IRON", "RETICCTPCT" in the last 72 hours. Sepsis Labs: No results for input(s): "PROCALCITON", "LATICACIDVEN" in the last 168 hours.  Recent Results (from the past 240 hour(s))  Blood Culture (routine x 2)     Status: None   Collection Time: 11/30/21  3:57 PM   Specimen: BLOOD LEFT FOREARM  Result Value Ref Range Status   Specimen Description   Final    BLOOD LEFT FOREARM BOTTLES DRAWN AEROBIC AND ANAEROBIC   Special Requests Blood Culture adequate volume  Final   Culture   Final    NO GROWTH 5 DAYS Performed at [facility], [address], [place], [place] [address]    Report Status 12/05/2021 FINAL  Final  Resp Panel by RT-PCR (Flu A&B, Covid) Anterior Nasal Swab     Status: None   Collection Time: 11/30/21  4:13 PM   Specimen: Anterior Nasal Swab  Result Value Ref Range Status   SARS Coronavirus 2 by RT  PCR NEGATIVE NEGATIVE Final    Comment: (NOTE) SARS-CoV-2 target nucleic acids are NOT DETECTED.  The SARS-CoV-2 RNA is generally detectable in upper respiratory specimens during the acute phase of infection. The lowest concentration of SARS-CoV-2 viral copies this assay can detect is 138 copies/mL. A negative result does not preclude SARS-Cov-2 infection and should not be used as the sole basis for treatment or other patient management decisions. A negative result may occur with  improper specimen collection/handling, submission of specimen other than nasopharyngeal swab, presence of viral mutation(s) within the areas targeted by this assay, and inadequate number of viral copies(<138 copies/mL). A negative result must be combined with clinical observations, patient history, and epidemiological information. The expected result is Negative.  Fact Sheet for Patients:  EntrepreneurPulse.com.au  Fact Sheet for Healthcare Providers:  IncredibleEmployment.be  This test is no t yet approved or cleared by the [place] FDA and  has been authorized for detection and/or diagnosis of SARS-CoV-2 by FDA under an Emergency Use Authorization (EUA). This EUA will remain  in effect (meaning this test can be used) for the duration of the COVID-19 declaration under Section 564(b)(1) of the Act, 21 U.S.C.section 360bbb-3(b)(1), unless the authorization is terminated  or revoked sooner.       Influenza A by PCR NEGATIVE NEGATIVE Final   Influenza B by PCR NEGATIVE NEGATIVE Final    Comment: (NOTE) The Xpert Xpress SARS-CoV-2/FLU/RSV plus assay is intended as an aid in the diagnosis of influenza from Nasopharyngeal swab specimens and should not be used as a sole basis for treatment. Nasal washings and aspirates are unacceptable for Xpert Xpress SARS-CoV-2/FLU/RSV testing.  Fact Sheet for Patients: EntrepreneurPulse.com.au  Fact Sheet for  Healthcare Providers: IncredibleEmployment.be  This test is not yet approved or cleared by the [place] FDA and has been authorized for detection and/or diagnosis of SARS-CoV-2 by FDA under an Emergency Use Authorization (EUA). This EUA will remain in effect (meaning this test can be used) for the duration of the  COVID-19 declaration under Section 564(b)(1) of the Act, 21 U.S.C. section 360bbb-3(b)(1), unless the authorization is terminated or revoked.  Performed at [facility], [address] [place], [place] [address]   Blood Culture (routine x 2)     Status: None   Collection Time: 11/30/21  4:24 PM   Specimen: Right Antecubital; Blood  Result Value Ref Range Status   Specimen Description   Final    RIGHT ANTECUBITAL BOTTLES DRAWN AEROBIC AND ANAEROBIC   Special Requests Blood Culture adequate volume  Final   Culture   Final    NO GROWTH 5 DAYS Performed at [facility], [address] [place], [place] [address]    Report Status 12/05/2021 FINAL  Final  Urine Culture     Status: None   Collection Time: 11/30/21  9:59 PM   Specimen: Urine, Catheterized  Result Value Ref Range Status   Specimen Description   Final    URINE, CATHETERIZED Performed at [facility] [address], [place], [place] [address]    Special Requests   Final    NONE Performed at [facility], [place] [address] [place], [place] [address]    Culture   Final    NO GROWTH Performed at [facility], [place] [address], [place], [place] [address]    Report Status 12/02/2021 FINAL  Final    Radiology Studies: No results found.  Scheduled Meds:  amLODipine  5 mg Oral Daily   aspirin EC  81 mg Oral Daily   carvedilol  6.25 mg Oral BID   Chlorhexidine Gluconate Cloth  6 each Topical Q0600   cholecalciferol  1,000 Units Oral Daily   cyanocobalamin  1,000 mcg Oral Daily   folic acid  1 mg Oral Daily   heparin  5,000 Units Subcutaneous Q8H    latanoprost  1 drop Both Eyes QHS   tamsulosin  0.4 mg Oral Daily   Continuous Infusions:   LOS: 8 days   Raiford Noble, DO [facility] Available via Epic secure chat 7am-7pm After these hours, please refer to coverage provider listed on [email or web address] 12/08/2021, 3:52 PM

## 2021-12-08 NOTE — TOC Progression Note (Signed)
Transition of Care [facility]) - Progression Note    Patient Details  Name: Neil Brickell MRN: [id number] Date of Birth: 10/12/1928  Transition of Care [facility]) CM/SW Contact  Esaw Knippel, Juliann Pulse, RN Phone Number: 12/08/2021, 3:30 PM  Clinical Narrative: Damaris Schooner to dtr [name] about bed offers await choice;left list in rm.        Barriers to Discharge: Continued Medical Work up  Expected Discharge Plan and Services     Discharge Planning Services: CM Consult Post Acute Care Choice: [place] arrangements for the past 2 months: Single Family Home                                       Social Determinants of Health (SDOH) Interventions    Readmission Risk Interventions     No data to display

## 2021-12-09 DIAGNOSIS — N433 Hydrocele, unspecified: Secondary | ICD-10-CM | POA: Diagnosis not present

## 2021-12-09 DIAGNOSIS — I2510 Atherosclerotic heart disease of native coronary artery without angina pectoris: Secondary | ICD-10-CM | POA: Diagnosis not present

## 2021-12-09 DIAGNOSIS — G9341 Metabolic encephalopathy: Secondary | ICD-10-CM | POA: Diagnosis not present

## 2021-12-09 DIAGNOSIS — R338 Other retention of urine: Secondary | ICD-10-CM | POA: Diagnosis not present

## 2021-12-09 LAB — COMPREHENSIVE METABOLIC PANEL
ALT: 38 U/L (ref 0–44)
AST: 53 U/L — ABNORMAL HIGH (ref 15–41)
Albumin: 2.7 g/dL — ABNORMAL LOW (ref 3.5–5.0)
Alkaline Phosphatase: 78 U/L (ref 38–126)
Anion gap: 9 (ref 5–15)
BUN: 25 mg/dL — ABNORMAL HIGH (ref 8–23)
CO2: 21 mmol/L — ABNORMAL LOW (ref 22–32)
Calcium: 8.7 mg/dL — ABNORMAL LOW (ref 8.9–10.3)
Chloride: 111 mmol/L (ref 98–111)
Creatinine, Ser: 1.35 mg/dL — ABNORMAL HIGH (ref 0.61–1.24)
GFR, Estimated: 50 mL/min — ABNORMAL LOW (ref >60)
Glucose, Bld: 99 mg/dL (ref 70–99)
Potassium: 3.7 mmol/L (ref 3.5–5.1)
Sodium: 141 mmol/L (ref 135–145)
Total Bilirubin: 0.8 mg/dL (ref 0.3–1.2)
Total Protein: 6.5 g/dL (ref 6.5–8.1)

## 2021-12-09 LAB — CBC WITH DIFFERENTIAL/PLATELET
Abs Immature Granulocytes: 0.01 10*3/uL (ref 0.00–0.07)
Basophils Absolute: 0 10*3/uL (ref 0.0–0.1)
Basophils Relative: 1 %
Eosinophils Absolute: 0.1 10*3/uL (ref 0.0–0.5)
Eosinophils Relative: 2 %
HCT: 27.4 % — ABNORMAL LOW (ref 39.0–52.0)
Hemoglobin: 8.8 g/dL — ABNORMAL LOW (ref 13.0–17.0)
Immature Granulocytes: 0 %
Lymphocytes Relative: 15 %
Lymphs Abs: 0.7 10*3/uL (ref 0.7–4.0)
MCH: 34.1 pg — ABNORMAL HIGH (ref 26.0–34.0)
MCHC: 32.1 g/dL (ref 30.0–36.0)
MCV: 106.2 fL — ABNORMAL HIGH (ref 80.0–100.0)
Monocytes Absolute: 0.6 10*3/uL (ref 0.1–1.0)
Monocytes Relative: 12 %
Neutro Abs: 3.2 10*3/uL (ref 1.7–7.7)
Neutrophils Relative %: 70 %
Platelets: 179 10*3/uL (ref 150–400)
RBC: 2.58 MIL/uL — ABNORMAL LOW (ref 4.22–5.81)
RDW: 20.6 % — ABNORMAL HIGH (ref 11.5–15.5)
WBC: 4.6 10*3/uL (ref 4.0–10.5)
nRBC: 0.7 % — ABNORMAL HIGH (ref 0.0–0.2)

## 2021-12-09 LAB — PHOSPHORUS: Phosphorus: 3.2 mg/dL (ref 2.5–4.6)

## 2021-12-09 LAB — MAGNESIUM: Magnesium: 2.2 mg/dL (ref 1.7–2.4)

## 2021-12-09 LAB — GLUCOSE, CAPILLARY: Glucose-Capillary: 82 mg/dL (ref 70–99)

## 2021-12-09 MED ORDER — FUROSEMIDE 10 MG/ML IJ SOLN
20.0000 mg | Freq: Once | INTRAMUSCULAR | Status: AC
  Administered 2021-12-09: 20 mg via INTRAVENOUS
  Filled 2021-12-09: qty 2

## 2021-12-09 MED ORDER — FUROSEMIDE 10 MG/ML IJ SOLN
40.0000 mg | Freq: Once | INTRAMUSCULAR | Status: AC
  Administered 2021-12-09: 40 mg via INTRAVENOUS
  Filled 2021-12-09: qty 4

## 2021-12-09 NOTE — Plan of Care (Signed)

## 2021-12-09 NOTE — Progress Notes (Signed)
PROGRESS NOTE    Terrance Miller  QJJ:[id number] DOB: 1928/07/16 DOA: 11/30/2021 PCP: Coral Spikes, DO   Brief Narrative:  Terrance Miller is a 88 yo male with PMH recent hydrocele s/p hydrocelectomy 11/15, CAD, DM II, HLD, HTN who presented with altered mentation and difficulty with urination.  Patient had a Penrose drain after surgery which was discontinued prior to admission by daughter.  Due to developing difficulty with oral intake and worsening weakness and confusion he was brought to the hospital for further evaluation. Urology was consulted for Foley placement due to severe edema.    **Interim History His labs are slowly improving and he continues to have adequate urine output.  Foley was leaking a little bit this morning and urology feels this is related to bladder spasm but they feel that it Foley still draining adequately. Will resume IV fluids with D5W given his hypernatremia but D5W will be stopping today.  PT OT recommending SNF and his labs are improving but mentation is still about the same and he did sundown last night.  Had issues with his Foley catheter and so now urology is going to be evaluating.  Will give him some Lasix again given his swelling and continue to monitor his renal function carefully  Assessment and Plan:  Acute Metabolic Encephalopathy, slowly improving -Most likely in the setting of hypernatremia, dehydration; chronic component? -CT head with no acute intracranial process identified -The fluids have now stopped. -Continue to hold Seroquel -UA negative for evidence of infection -Mentation is improving slowly and will need to continue monitor closely -Fluids should be discontinued yesterday and will try some IV Lasix again today given swelling (fourth day in a row for IV Lasix and may increase to either twice daily dosing or increased his once a day dosing to 80 mg given that he remains significantly volume overloaded) -Placed on Delirium Precautions  -Patient's  mentation improved to be improving daily. -Will need to be up and out of bed daily -PT OT recommending SNF and patient's daughter has been contacted for decision however TOC was unable to get in touch with the daughter today for decision about SNF   B/L large Hydrocele status post hydrocelectomy Urinary Retention -S/p bilateral hydrocelectomy by Dr. Felipa Eth 11/15 -Foley placed by urology on admission due to severe scrotal edema -Foley placed 11/25 and is recommended to remain in place until edema resolves and outpatient followup -UA negative for signs of infection after catheter placed Therefore discontinued IV Ceftriaxone at this time -Appears to have some dehiscence of wound with fluid leak noted on 12/02/2021.   -Foley continues to leak and right bladder scan shows 0 and he is 0 output in the bag; urology has been notified and they are recommending obtaining it to be strange and saline for irrigation and they will get a Foley catheter evaluate during lunchtime: Foley catheter was replaced yesterday by urology; urology reevaluated and recommending instructing nursing staff to contact wound care to change dressings as he does not.  Maintain the last 48 hours they are recommending wet-to-dry dressing and they are recommending keeping the scrotum elevated and keeping Foley catheter in place given his extensive penile and scrotal edema -Strict I's and O's and Daily Weights; Patient is -1.876 Liters -Continue diuresis with IV Lasix -My colleague has spoke with the Urological Team  -Not large enough to pack yet so continue cleansing and covering with dry gauze and local wound care with dressing -Patient is to follow-up with Dr. Felipa Eth in outpatient  setting -PT OT recommending SNF once medically stable and improved   Hypernatremia, improved Hyperchloremia improving -Some improvement with fluids -Na+ went from 156 and is now stable at 141 -Chloride level is 111 now -Was placed on D5W but is  now stopped as sodium is improved   Hypokalemia -Patient's K+ is now gone from 4.7 -> 3.4 -> 3.9 -> 4.1 -> 4.0 -> 3.7 -Continue to monitor replete as necessary and repeat CMP in the   Abnormal LFTs/Transaminitis -Likely due to above, continue to trend and repeat LFTs in the AM  -AST went from 36 -> 44 -> 50 -> 58 -> 53 and ALT is gone from 33 -> 35 -> 40 -> 42 -> 38 -Continue to Hold Pravastatin   AKI on CKD stage IIIa Metabolic Acidosis  -Continue with IV fluids as above -Patient's BUN/Cr went from 32/1.77 -> 27/1.48 -> 24/1.47 -> 22/1.26 -> 22/1.46 -> 19/1.33 -> 23/1.56 ->  21/1.54 -> 25/1.35  -Patient has a slight metabolic acidosis with a CO2 of 21, anion gap of 9, chloride level of 111 -Avoid nephrotoxic medications, contrast dyes, hypotension and dehydration to ensure adequate renal perfusion and renally adjust medications -Continue monitor and trend and repeat CMP in the AM    Hyperlipidemia LDL goal <70 -Pravastatin 40 mg po Daily currently held due to Abnormal LFTs -AST is now 50 -> 58 -> 53 and ALT is now 40 -> 42 -> 38 -Continue monitor and trend LFTs carefully and repeat CMP in the a.m.   Macrocytic Anemia -Patient's Hemoglobin/hematocrit is gone from 9.1/28.5 -> 8.8/27.0 -> 9.1/28.5 -> 8.7/27.6 -> 8.4/25.9 -> 8.7/27.5 -> 9.0/29.4 -> 9.9/31.2 -> 8.8/27.4 with an MCV of 106.2 -Anemia panel was checked and showed an iron level of 35, UIBC 155, TIBC of 190, saturation ratios of 19, ferritin level 594, folate level 5.9, and vitamin B12 1176 -Continue to monitor for signs and symptoms of bleeding; no overt bleeding noted -Repeat CBC in a.m.   Hypertension -Continue Amlodipine 5 mg po Daily and Carvedilol 6.25 mg po BID -C/w Hydralazine 5 mg IV q4hprn HBP -Continue to Monitor BP per Protocol -Last BP reading was 116/63   Hypoalbuminemia -Patient's Albumin Level is now 2.7 -> 2.8 -> 2.7 x2 -> 2.7 -Continue to Monitor and Trend and repeat CMP in the AM     Obesity -Complicates overall prognosis and care -Estimated body mass index is 38.74 kg/m as calculated from the following:   Height as of this encounter: '5\' 6"'$  (1.676 m).   Weight as of this encounter: 108.9 kg.  -Weight Loss and Dietary Counseling given  DVT prophylaxis: heparin injection 5,000 Units Start: 11/30/21 2245 SCDs Start: 11/30/21 2159    Code Status: Full Code Family Communication: No family currently at bedside and TOC was unable to get in touch with the patient's daughter  Disposition Plan:  Level of care: Telemetry Status is: Inpatient Remains inpatient appropriate because: Continued to be diuresed and   Consultants:  Urology  Procedures:  As delineated as above  Antimicrobials:  Anti-infectives (From admission, onward)    Start     Dose/Rate Route Frequency Ordered Stop   11/30/21 1600  cefTRIAXone (ROCEPHIN) 2 g in sodium chloride 0.9 % 100 mL IVPB  Status:  Discontinued        2 g 200 mL/hr over 30 Minutes Intravenous Every 24 hours 11/30/21 1546 12/01/21 1330       Subjective: Seen and examined at bedside he is sitting in the chair and  felt okay.  Seen in the room watching TV television and states that he is watching "old cowboy picture".  No nausea or vomiting.  Continues to be remains swollen.  Diuresing and may need to increase diuresis  Objective: Vitals:   12/08/21 1145 12/08/21 2133 12/09/21 0655 12/09/21 1155  BP: 124/66 (!) 155/78 (!) 159/80 116/63  Pulse: (!) 50 60 (!) 56 (!) 52  Resp: '18 18 17 18  '$ Temp: (!) 97.5 F (36.4 C) (!) 97.5 F (36.4 C) (!) 97.3 F (36.3 C) (!) 97.3 F (36.3 C)  TempSrc: Oral Oral Oral Oral  SpO2: 99% 98% 98% 99%  Weight:      Height:        Intake/Output Summary (Last 24 hours) at 12/09/2021 1646 Last data filed at 12/09/2021 1300 Gross per 24 hour  Intake 520 ml  Output 950 ml  Net -430 ml   Filed Weights   11/30/21 1925  Weight: 108.9 kg   Examination: Physical Exam:  Constitutional: WN/WD  obese elderly African-American male currently no acute distress Respiratory: Diminished to auscultation bilaterally, no wheezing, rales, rhonchi or crackles. Normal respiratory effort and patient is not tachypenic. No accessory muscle use.  Unlabored breathing Cardiovascular: RRR, no murmurs / rubs / gallops. S1 and S2 auscultated.  Has 1+ lower extremity edema Abdomen: Soft, non-tender, stented secondary to body habitus. Bowel sounds positive.  GU: Deferred.  Has significant penile and scrotal swelling and has a Foley catheter in place.  Needed to be turned to review his wound but unable to do so today so we will need to reevaluate tomorrow Musculoskeletal: No clubbing / cyanosis of digits/nails. No joint deformity upper and lower extremities. .  Skin: No rashes, lesions, ulcers. No induration; Warm and dry.  Neurologic: CN 2-12 grossly intact with no focal deficits.  Romberg sign and cerebellar reflexes not assessed.  Psychiatric: Slightly impaired judgment and insight. Alert and oriented x 2  Data Reviewed: I have personally reviewed following labs and imaging studies  CBC: Recent Labs  Lab 12/05/21 0919 12/06/21 0507 12/07/21 0522 12/08/21 0418 12/09/21 1034  WBC 5.9 4.7 4.6 4.6 4.6  NEUTROABS 4.2 3.2 3.2 2.9 3.2  HGB 8.4* 8.7* 9.0* 9.9* 8.8*  HCT 25.9* 27.5* 29.4* 31.2* 27.4*  MCV 107.9* 109.6* 111.4* 109.1* 106.2*  PLT 141* 151 154 168 659   Basic Metabolic Panel: Recent Labs  Lab 12/05/21 0919 12/06/21 0507 12/07/21 0522 12/08/21 0418 12/09/21 1034  NA 142 143 143 142 141  K 3.4* 3.9 4.1 4.0 3.7  CL 116* 115* 116* 114* 111  CO2 23 21* 21* 22 21*  GLUCOSE 147* 104* 129* 105* 99  BUN '22 19 23 21 '$ 25*  CREATININE 1.46* 1.33* 1.56* 1.54* 1.35*  CALCIUM 8.4* 8.5* 8.5* 8.8* 8.7*  MG 2.0 2.4 2.2 2.1 2.2  PHOS 2.7 3.0 2.9 2.9 3.2   GFR: Estimated Creatinine Clearance: 41.2 mL/min (A) (by C-G formula based on SCr of 1.35 mg/dL (H)). Liver Function Tests: Recent Labs   Lab 12/05/21 0919 12/06/21 0507 12/07/21 0522 12/08/21 0418 12/09/21 1034  AST 36 44* 50* 58* 53*  ALT 33 35 40 42 38  ALKPHOS 65 72 75 80 78  BILITOT 0.9 0.8 0.7 0.7 0.8  PROT 6.1* 6.5 6.4* 6.6 6.5  ALBUMIN 2.7* 2.8* 2.7* 2.7* 2.7*   No results for input(s): "LIPASE", "AMYLASE" in the last 168 hours. No results for input(s): "AMMONIA" in the last 168 hours. Coagulation Profile: No results for input(s): "INR", "  PROTIME" in the last 168 hours. Cardiac Enzymes: No results for input(s): "CKTOTAL", "CKMB", "CKMBINDEX", "TROPONINI" in the last 168 hours. BNP (last 3 results) No results for input(s): "PROBNP" in the last 8760 hours. HbA1C: No results for input(s): "HGBA1C" in the last 72 hours. CBG: Recent Labs  Lab 12/08/21 2137 12/09/21 0749  GLUCAP 94 82   Lipid Profile: No results for input(s): "CHOL", "HDL", "LDLCALC", "TRIG", "CHOLHDL", "LDLDIRECT" in the last 72 hours. Thyroid Function Tests: No results for input(s): "TSH", "T4TOTAL", "FREET4", "T3FREE", "THYROIDAB" in the last 72 hours. Anemia Panel: No results for input(s): "VITAMINB12", "FOLATE", "FERRITIN", "TIBC", "IRON", "RETICCTPCT" in the last 72 hours. Sepsis Labs: No results for input(s): "PROCALCITON", "LATICACIDVEN" in the last 168 hours.  Recent Results (from the past 240 hour(s))  Blood Culture (routine x 2)     Status: None   Collection Time: 11/30/21  3:57 PM   Specimen: BLOOD LEFT FOREARM  Result Value Ref Range Status   Specimen Description   Final    BLOOD LEFT FOREARM BOTTLES DRAWN AEROBIC AND ANAEROBIC   Special Requests Blood Culture adequate volume  Final   Culture   Final    NO GROWTH 5 DAYS Performed at [facility], [address] [place], [place] [address]    Report Status 12/05/2021 FINAL  Final  Resp Panel by RT-PCR (Flu A&B, Covid) Anterior Nasal Swab     Status: None   Collection Time: 11/30/21  4:13 PM   Specimen: Anterior Nasal Swab  Result Value Ref Range Status   SARS  Coronavirus 2 by RT PCR NEGATIVE NEGATIVE Final    Comment: (NOTE) SARS-CoV-2 target nucleic acids are NOT DETECTED.  The SARS-CoV-2 RNA is generally detectable in upper respiratory specimens during the acute phase of infection. The lowest concentration of SARS-CoV-2 viral copies this assay can detect is 138 copies/mL. A negative result does not preclude SARS-Cov-2 infection and should not be used as the sole basis for treatment or other patient management decisions. A negative result may occur with  improper specimen collection/handling, submission of specimen other than nasopharyngeal swab, presence of viral mutation(s) within the areas targeted by this assay, and inadequate number of viral copies(<138 copies/mL). A negative result must be combined with clinical observations, patient history, and epidemiological information. The expected result is Negative.  Fact Sheet for Patients:  EntrepreneurPulse.com.au  Fact Sheet for Healthcare Providers:  IncredibleEmployment.be  This test is no t yet approved or cleared by the [place] FDA and  has been authorized for detection and/or diagnosis of SARS-CoV-2 by FDA under an Emergency Use Authorization (EUA). This EUA will remain  in effect (meaning this test can be used) for the duration of the COVID-19 declaration under Section 564(b)(1) of the Act, 21 U.S.C.section 360bbb-3(b)(1), unless the authorization is terminated  or revoked sooner.       Influenza A by PCR NEGATIVE NEGATIVE Final   Influenza B by PCR NEGATIVE NEGATIVE Final    Comment: (NOTE) The Xpert Xpress SARS-CoV-2/FLU/RSV plus assay is intended as an aid in the diagnosis of influenza from Nasopharyngeal swab specimens and should not be used as a sole basis for treatment. Nasal washings and aspirates are unacceptable for Xpert Xpress SARS-CoV-2/FLU/RSV testing.  Fact Sheet for  Patients: EntrepreneurPulse.com.au  Fact Sheet for Healthcare Providers: IncredibleEmployment.be  This test is not yet approved or cleared by the [place] FDA and has been authorized for detection and/or diagnosis of SARS-CoV-2 by FDA under an Emergency Use Authorization (EUA). This EUA  will remain in effect (meaning this test can be used) for the duration of the COVID-19 declaration under Section 564(b)(1) of the Act, 21 U.S.C. section 360bbb-3(b)(1), unless the authorization is terminated or revoked.  Performed at [facility], [address] [place], [place] [address]   Blood Culture (routine x 2)     Status: None   Collection Time: 11/30/21  4:24 PM   Specimen: Right Antecubital; Blood  Result Value Ref Range Status   Specimen Description   Final    RIGHT ANTECUBITAL BOTTLES DRAWN AEROBIC AND ANAEROBIC   Special Requests Blood Culture adequate volume  Final   Culture   Final    NO GROWTH 5 DAYS Performed at [facility], [address], [place], [place] [address]    Report Status 12/05/2021 FINAL  Final  Urine Culture     Status: None   Collection Time: 11/30/21  9:59 PM   Specimen: Urine, Catheterized  Result Value Ref Range Status   Specimen Description   Final    URINE, CATHETERIZED Performed at [place] [address], [place], [place] [address]    Special Requests   Final    NONE Performed at [facility], [place] [address], [place], [place] [address]    Culture   Final    NO GROWTH Performed at [facility], [place] [address], [place], [place] [address]    Report Status 12/02/2021 FINAL  Final    Radiology Studies: No results found.  Scheduled Meds:  amLODipine  5 mg Oral Daily   aspirin EC  81 mg Oral Daily   carvedilol  6.25 mg Oral BID   Chlorhexidine Gluconate Cloth  6 each Topical Q0600   cholecalciferol  1,000 Units Oral Daily   cyanocobalamin  1,000 mcg Oral Daily    folic acid  1 mg Oral Daily   heparin  5,000 Units Subcutaneous Q8H   latanoprost  1 drop Both Eyes QHS   tamsulosin  0.4 mg Oral Daily   Continuous Infusions:   LOS: 9 days   Raiford Noble, DO [facility] Available via Epic secure chat 7am-7pm After these hours, please refer to coverage provider listed on [email or web address] 12/09/2021, 4:46 PM

## 2021-12-09 NOTE — Progress Notes (Signed)
Notified on call provider about patient having scheduled Coreg with the concern of patient's HR being in the 40's/50's even though BP 155/78. On call provider instructed RN to hold and utilize PRN hydralazine if needed.

## 2021-12-09 NOTE — TOC Progression Note (Signed)
Transition of Care [facility]) - Progression Note    Patient Details  Name: Terrance Miller MRN: [id number] Date of Birth: Aug 15, 1928  [facility]) CM/SW Contact  Cassiel Fernandez, Juliann Pulse, RN Phone Number: 12/09/2021, 12:37 PM  Clinical Narrative:Unsuccessful attempt to contact dtr [name]-phone fulol unable to leave message. Await bed choice.       Expected Discharge Plan: [place] Barriers to Discharge: Continued Medical Work up  Expected Discharge Plan and Services Expected Discharge Plan: [place]   Discharge Planning Services: CM Consult Post Acute Care Choice: [place] Living arrangements for the past 2 months: Single Family Home                                       Social Determinants of Health (SDOH) Interventions    Readmission Risk Interventions     No data to display

## 2021-12-10 DIAGNOSIS — N433 Hydrocele, unspecified: Secondary | ICD-10-CM | POA: Diagnosis not present

## 2021-12-10 DIAGNOSIS — G9341 Metabolic encephalopathy: Secondary | ICD-10-CM | POA: Diagnosis not present

## 2021-12-10 DIAGNOSIS — I2510 Atherosclerotic heart disease of native coronary artery without angina pectoris: Secondary | ICD-10-CM | POA: Diagnosis not present

## 2021-12-10 DIAGNOSIS — R338 Other retention of urine: Secondary | ICD-10-CM | POA: Diagnosis not present

## 2021-12-10 LAB — CBC WITH DIFFERENTIAL/PLATELET
Abs Immature Granulocytes: 0.02 10*3/uL (ref 0.00–0.07)
Basophils Absolute: 0 10*3/uL (ref 0.0–0.1)
Basophils Relative: 1 %
Eosinophils Absolute: 0.1 10*3/uL (ref 0.0–0.5)
Eosinophils Relative: 2 %
HCT: 29.4 % — ABNORMAL LOW (ref 39.0–52.0)
Hemoglobin: 9.4 g/dL — ABNORMAL LOW (ref 13.0–17.0)
Immature Granulocytes: 0 %
Lymphocytes Relative: 14 %
Lymphs Abs: 0.7 10*3/uL (ref 0.7–4.0)
MCH: 33.7 pg (ref 26.0–34.0)
MCHC: 32 g/dL (ref 30.0–36.0)
MCV: 105.4 fL — ABNORMAL HIGH (ref 80.0–100.0)
Monocytes Absolute: 0.6 10*3/uL (ref 0.1–1.0)
Monocytes Relative: 12 %
Neutro Abs: 3.4 10*3/uL (ref 1.7–7.7)
Neutrophils Relative %: 71 %
Platelets: 179 10*3/uL (ref 150–400)
RBC: 2.79 MIL/uL — ABNORMAL LOW (ref 4.22–5.81)
RDW: 21.4 % — ABNORMAL HIGH (ref 11.5–15.5)
WBC: 4.8 10*3/uL (ref 4.0–10.5)
nRBC: 0.4 % — ABNORMAL HIGH (ref 0.0–0.2)

## 2021-12-10 LAB — COMPREHENSIVE METABOLIC PANEL
ALT: 39 U/L (ref 0–44)
AST: 48 U/L — ABNORMAL HIGH (ref 15–41)
Albumin: 2.6 g/dL — ABNORMAL LOW (ref 3.5–5.0)
Alkaline Phosphatase: 75 U/L (ref 38–126)
Anion gap: 8 (ref 5–15)
BUN: 22 mg/dL (ref 8–23)
CO2: 22 mmol/L (ref 22–32)
Calcium: 9 mg/dL (ref 8.9–10.3)
Chloride: 109 mmol/L (ref 98–111)
Creatinine, Ser: 1.4 mg/dL — ABNORMAL HIGH (ref 0.61–1.24)
GFR, Estimated: 47 mL/min — ABNORMAL LOW (ref >60)
Glucose, Bld: 85 mg/dL (ref 70–99)
Potassium: 3.5 mmol/L (ref 3.5–5.1)
Sodium: 139 mmol/L (ref 135–145)
Total Bilirubin: 0.8 mg/dL (ref 0.3–1.2)
Total Protein: 6.5 g/dL (ref 6.5–8.1)

## 2021-12-10 LAB — PHOSPHORUS: Phosphorus: 3.3 mg/dL (ref 2.5–4.6)

## 2021-12-10 LAB — MAGNESIUM: Magnesium: 1.9 mg/dL (ref 1.7–2.4)

## 2021-12-10 MED ORDER — FUROSEMIDE 10 MG/ML IJ SOLN
60.0000 mg | Freq: Once | INTRAMUSCULAR | Status: AC
  Administered 2021-12-10: 60 mg via INTRAVENOUS
  Filled 2021-12-10: qty 6

## 2021-12-10 NOTE — Progress Notes (Signed)
Physical Therapy Treatment Patient Details Name: Terrance Miller MRN: [id number] DOB: 09/30/1928 Today's Date: 12/10/2021   History of Present Illness Patient is a 88 year old male who presented with altered mentation and difficulty with urination, Urology was consulted for Foley placement due to severe edema. recent hydrocele s/p hydrocelectomy 11/15. PMH CAD, DM II, HLD, HTN    PT Comments    Pt's cognition much improved since previous sessions.  Pt mod assist for bed mobility and min assist to ambulate.  Pt able to ambulate short distance into hallway today!  Continue to recommend SNF for rehab upon d/c.    Recommendations for follow up therapy are one component of a multi-disciplinary discharge planning process, led by the attending physician.  Recommendations may be updated based on patient status, additional functional criteria and insurance authorization.  Follow Up Recommendations  Skilled nursing-short term rehab (<3 hours/day) Can patient physically be transported by private vehicle: No   Assistance Recommended at Discharge Frequent or constant Supervision/Assistance  Patient can return home with the following A lot of help with bathing/dressing/bathroom;Assist for transportation;Assistance with cooking/housework;Help with stairs or ramp for entrance;A little help with walking and/or transfers   Equipment Recommendations  None recommended by PT    Recommendations for Other Services       Precautions / Restrictions Precautions Precautions: Fall Precaution Comments: severe scrotal edema/hydrocele, visual deficits?     Mobility  Bed Mobility Overal bed mobility: Needs Assistance Bed Mobility: Supine to Sit     Supine to sit: Mod assist, HOB elevated     General bed mobility comments: verbal cues for hand placement and self assist, allowed pt time to perform tasks and assisted with LEs over EOB and scooting to EOB, pt better able to assist with upper body     Transfers Overall transfer level: Needs assistance Equipment used: Rolling walker (2 wheels) Transfers: Sit to/from Stand Sit to Stand: Min assist, +2 safety/equipment, From elevated surface           General transfer comment: multimodal cues for technique, wide BOS due to edema    Ambulation/Gait Ambulation/Gait assistance: Min assist Gait Distance (Feet): 40 Feet Assistive device: Rolling walker (2 wheels) Gait Pattern/deviations: Step-through pattern, Decreased stride length, Wide base of support Gait velocity: decr     General Gait Details: verbal cues for RW positioning and encouraged distance as tolerated, nurse tech followed with recliner for safety   Stairs             Wheelchair Mobility    Modified Rankin (Stroke Patients Only)       Balance Overall balance assessment: Needs assistance Sitting-balance support: No upper extremity supported, Feet supported Sitting balance-Leahy Scale: Fair     Standing balance support: Reliant on assistive device for balance, Bilateral upper extremity supported, During functional activity Standing balance-Leahy Scale: Poor                              Cognition Arousal/Alertness: Awake/alert Behavior During Therapy: WFL for tasks assessed/performed Overall Cognitive Status: Within Functional Limits for tasks assessed                                 General Comments: pt's cognition much improved compared to previous session, pt conversing and following verbal cues well        Exercises      General Comments  Pertinent Vitals/Pain Pain Assessment Pain Assessment: No/denies pain Pain Intervention(s): Repositioned, Monitored during session (no pain at rest, moderate pain due to scrotal edeme with mobilizing)    Home Living                          Prior Function            PT Goals (current goals can now be found in the care plan section) Progress towards  PT goals: Progressing toward goals    Frequency    Min 2X/week      PT Plan Current plan remains appropriate    Co-evaluation              AM-PAC PT "6 Clicks" Mobility   Outcome Measure  Help needed turning from your back to your side while in a flat bed without using bedrails?: A Lot Help needed moving from lying on your back to sitting on the side of a flat bed without using bedrails?: A Lot Help needed moving to and from a bed to a chair (including a wheelchair)?: A Lot Help needed standing up from a chair using your arms (e.g., wheelchair or bedside chair)?: A Lot Help needed to walk in hospital room?: A Lot Help needed climbing 3-5 steps with a railing? : A Lot 6 Click Score: 12    End of Session Equipment Utilized During Treatment: Gait belt Activity Tolerance: Patient tolerated treatment well Patient left: in chair;with call bell/phone within reach;with nursing/sitter in room Nurse Communication: Mobility status PT Visit Diagnosis: Muscle weakness (generalized) (M62.81);Difficulty in walking, not elsewhere classified (R26.2)     Time: 5790-3833 PT Time Calculation (min) (ACUTE ONLY): 16 min  Charges:  $Gait Training: 8-22 mins                    Arlyce Dice, DPT Physical Therapist Acute Rehabilitation Services Preferred contact method: Secure Chat Weekend Pager Only: [phone] Office: [place] 12/10/2021, 12:03 PM

## 2021-12-10 NOTE — TOC Progression Note (Signed)
Transition of Care [facility]) - Progression Note    Patient Details  Name: Terrance Miller MRN: [id number] Date of Birth: 04/16/1928  Transition of Care [facility]) CM/SW Contact  Leeroy Cha, RN Phone Number: 12/10/2021, 10:47 AM  Clinical Narrative:    Blaine Hamper [id number] SNF 12/10/2021 Start date:12/11/2021   Expected Discharge Plan: Skilled Nursing Facility Barriers to Discharge: Continued Medical Work up  Expected Discharge Plan and Services Expected Discharge Plan: [place]   Discharge Planning Services: CM Consult Post Acute Care Choice: [place] Living arrangements for the past 2 months: Single Family Home                                       Social Determinants of Health (SDOH) Interventions    Readmission Risk Interventions   No data to display

## 2021-12-10 NOTE — Progress Notes (Signed)
PROGRESS NOTE    Windle Huebert  JQB:[id number] DOB: [birth year] DOA: 11/30/2021 PCP: Coral Spikes, DO   Brief Narrative:  Mr. Vandeberg is a 88 yo male with PMH recent hydrocele s/p hydrocelectomy 11/15, CAD, DM II, HLD, HTN who presented with altered mentation and difficulty with urination.  Patient had a Penrose drain after surgery which was discontinued prior to admission by daughter.  Due to developing difficulty with oral intake and worsening weakness and confusion he was brought to the hospital for further evaluation. Urology was consulted for Foley placement due to severe edema.    **Interim History His labs are slowly improving and he continues to have adequate urine output.  Foley was leaking a little bit this morning and urology feels this is related to bladder spasm but they feel that it Foley still draining adequately. Will resume IV fluids with D5W given his hypernatremia but D5W will be stopping today.  PT OT recommending SNF and his labs are improving but mentation is still about the same and he did sundown last night.  Had issues with his Foley catheter and so now urology is going to be evaluating this was replaced.  Given his volume overload he has been getting diuresis with IV Lasix and this was increased to 60 mg yesterday and was continued today.  Diuresing fairly well and swelling is slowly improving.  PT OT recommending SNF and has a bed available for 12/11/2021.  Urology recommending continuing local wound care and continue Foley for now  Assessment and Plan:  Acute Metabolic Encephalopathy,improving  -Most likely in the setting of hypernatremia, dehydration; chronic component? -CT head with no acute intracranial process identified -The fluids have now stopped. -Continue to hold Seroquel -UA negative for evidence of infection -Mentation is improving slowly and will need to continue monitor closely -Fluids should be discontinued yesterday and will try some IV Lasix again today  given swelling (fourth day in a row for IV Lasix and may increase to either twice daily dosing or increased his once a day dosing to 80 mg given that he remains significantly volume overloaded) -Placed on Delirium Precautions  -Patient's mentation improved to be improving daily. -Will need to be up and out of bed daily and ambulated with PT today and did well and was able to ambulate a short distance into the hallway today; Remains a Mod Assist for Bed mobility and Min assist to Ambulate -PT OT recommending SNF and patient's daughter has been contacted for decision and now has been made   B/L large Hydrocele status post hydrocelectomy Urinary Retention -S/p bilateral hydrocelectomy by Dr. Felipa Eth 11/15 -Foley placed by urology on admission due to severe scrotal edema -Foley placed 11/25 and is recommended to remain in place until edema resolves and outpatient followup -UA negative for signs of infection after catheter placed Therefore discontinued IV Ceftriaxone at this time -Appears to have some dehiscence of wound with fluid leak noted on 12/02/2021.   -Foley continues to leak and right bladder scan shows 0 and he is 0 output in the bag; urology has been notified and they are recommending obtaining it to be strange and saline for irrigation and they will get a Foley catheter evaluate during lunchtime: Foley catheter was replaced yesterday by urology; urology reevaluated and recommending instructing nursing staff to contact wound care to change dressings as he does not.  Maintain the last 48 hours they are recommending wet-to-dry dressing and they are recommending keeping the scrotum elevated and keeping Foley catheter  in place given his extensive penile and scrotal edema -Strict I's and O's and Daily Weights; Patient is -4.586 Liters -Continue diuresis with IV Lasix and will given another dose of IV 60 mg x1 -My colleague has spoke with the Urological Team  -Not large enough to pack yet so  continue cleansing and covering with dry gauze and local wound care with dressing -Patient is to follow-up with Dr. Felipa Eth in outpatient setting -PT OT recommending SNF and is improving and can discharge in the next 24-48 hours if stable.    Hypernatremia, improved Hyperchloremia improved -Some improvement with fluids -Na+ went from 156 and is now stable at 139 -Chloride level is 109 now -Was placed on D5W but is now stopped as sodium is improved and he is being diuresed now   Hypokalemia -Patient's K+ is now gone from 4.7 -> 3.4 -> 3.9 -> 4.1 -> 4.0 -> 3.7 -> 3.5 -Replete with po KCL 40 mEQ BID x2 -Continue to monitor replete as necessary and repeat CMP in the   Abnormal LFTs/Transaminitis -Likely due to above, continue to trend and repeat LFTs in the AM  -AST went from 36 -> 44 -> 50 -> 58 -> 53 -> 48 and ALT is gone from 33 -> 35 -> 40 -> 42 -> 38 -> 39 -Continue to Hold Pravastatin   AKI on CKD stage IIIa Metabolic Acidosis  -Continue with IV fluids as above -Patient's BUN/Cr went from 32/1.77 -> 27/1.48 -> 24/1.47 -> 22/1.26 -> 22/1.46 -> 19/1.33 -> 23/1.56 ->  21/1.54 -> 25/1.35 -> 22/1.40 -Patient has a slight metabolic acidosis with a CO2 of 22, anion gap of 8, chloride level of 109 -Avoid nephrotoxic medications, contrast dyes, hypotension and dehydration to ensure adequate renal perfusion and renally adjust medications -Continue monitor and trend and repeat CMP in the AM    Hyperlipidemia LDL goal <70 -Pravastatin 40 mg po Daily currently held due to Abnormal LFTs -AST is now 50 -> 58 -> 53 -> 48 and ALT is now 40 -> 42 -> 38 -> 39 -Continue monitor and trend LFTs carefully and repeat CMP in the a.m.   Macrocytic Anemia -Patient's Hemoglobin/hematocrit is gone from 9.1/28.5 -> 8.8/27.0 -> 9.1/28.5 -> 8.7/27.6 -> 8.4/25.9 -> 8.7/27.5 -> 9.0/29.4 -> 9.9/31.2 -> 8.8/27.4 -> 9.4/29.4 with an MCV of 105.4 -Anemia panel was checked and showed an iron level of 35, UIBC 155,  TIBC of 190, saturation ratios of 19, ferritin level 594, folate level 5.9, and vitamin B12 1176 -Continue to monitor for signs and symptoms of bleeding; no overt bleeding noted -Repeat CBC in a.m.   Hypertension -Continue Amlodipine 5 mg po Daily and Carvedilol 6.25 mg po BID -C/w Hydralazine 5 mg IV q4hprn HBP -Continue to Monitor BP per Protocol -Last BP reading was 123/73   Hypoalbuminemia -Patient's Albumin Level is now 2.7 -> 2.8 -> 2.7 x2 -> 2.7 -> 2.6 -Continue to Monitor and Trend and repeat CMP in the AM    Obesity -Complicates overall prognosis and care -Estimated body mass index is 38.74 kg/m as calculated from the following:   Height as of this encounter: '5\' 6"'$  (1.676 m).   Weight as of this encounter: 108.9 kg.  -Weight Loss and Dietary Counseling given  DVT prophylaxis: heparin injection 5,000 Units Start: 11/30/21 2245 SCDs Start: 11/30/21 2159    Code Status: Full Code Family Communication: No family present at bedside   Disposition Plan:  Level of care: Telemetry Status is: Inpatient Remains inpatient appropriate  because: Continues to get diuresed and is improving. Anticipate D/C in the next 24-48 hours to SNF   Consultants:  Urology  Procedures:  As delineated as above   Antimicrobials:  Anti-infectives (From admission, onward)    Start     Dose/Rate Route Frequency Ordered Stop   11/30/21 1600  cefTRIAXone (ROCEPHIN) 2 g in sodium chloride 0.9 % 100 mL IVPB  Status:  Discontinued        2 g 200 mL/hr over 30 Minutes Intravenous Every 24 hours 11/30/21 1546 12/01/21 1330       Subjective: Seen and examined at bedside he is much more awake and alert and was ambulating with the physical therapist in the hallway and was sitting in the chair when he returned.  Thinks that his swelling is improving a little bit.  Denies any chest pain or shortness breath.  No other concerns or complaints at this time.  Objective: Vitals:   12/10/21 0558 12/10/21  0809 12/10/21 1053 12/10/21 1212  BP: (!) 163/81 133/70 135/72 123/73  Pulse: (!) 56 (!) 55  (!) 51  Resp: '20 18  18  '$ Temp: 97.6 F (36.4 C)     TempSrc:      SpO2: 99% 100%  100%  Weight:      Height:        Intake/Output Summary (Last 24 hours) at 12/10/2021 1701 Last data filed at 12/10/2021 0700 Gross per 24 hour  Intake 340 ml  Output 3050 ml  Net -2710 ml   Filed Weights   11/30/21 1925  Weight: 108.9 kg   Examination: Physical Exam:  Constitutional: WN/WD obese African American male in no acute distress appears calm Respiratory: Diminished to auscultation bilaterally with coarse breath sounds, no wheezing, rales, rhonchi or crackles. Normal respiratory effort and patient is not tachypenic. No accessory muscle use.  Unlabored breathing Cardiovascular: RRR, no murmurs / rubs / gallops. S1 and S2 auscultated.  Has 1+ lower extremity edema Abdomen: Soft, non-tender, distended secondary to body habitus. Bowel sounds positive.  GU: Deferred.  Has significant scrotal and penile edema but is improving some has a Foley catheter in place Musculoskeletal: No clubbing / cyanosis of digits/nails.  No joint deformities noted in the upper and lower extremities Skin: No rashes, lesions, ulcers on limited skin evaluation. No induration; Warm and dry.  Neurologic: CN 2-12 grossly intact with no focal deficits. Romberg sign and cerebellar reflexes not assessed.  Psychiatric: Normal judgment and insight. Alert and oriented x 2  Data Reviewed: I have personally reviewed following labs and imaging studies  CBC: Recent Labs  Lab 12/06/21 0507 12/07/21 0522 12/08/21 0418 12/09/21 1034 12/10/21 0511  WBC 4.7 4.6 4.6 4.6 4.8  NEUTROABS 3.2 3.2 2.9 3.2 3.4  HGB 8.7* 9.0* 9.9* 8.8* 9.4*  HCT 27.5* 29.4* 31.2* 27.4* 29.4*  MCV 109.6* 111.4* 109.1* 106.2* 105.4*  PLT 151 154 168 179 532   Basic Metabolic Panel: Recent Labs  Lab 12/06/21 0507 12/07/21 0522 12/08/21 0418 12/09/21 1034  12/10/21 0511  NA 143 143 142 141 139  K 3.9 4.1 4.0 3.7 3.5  CL 115* 116* 114* 111 109  CO2 21* 21* 22 21* 22  GLUCOSE 104* 129* 105* 99 85  BUN '19 23 21 '$ 25* 22  CREATININE 1.33* 1.56* 1.54* 1.35* 1.40*  CALCIUM 8.5* 8.5* 8.8* 8.7* 9.0  MG 2.4 2.2 2.1 2.2 1.9  PHOS 3.0 2.9 2.9 3.2 3.3   GFR: Estimated Creatinine Clearance: 39.8 mL/min (A) (by C-G  formula based on SCr of 1.4 mg/dL (H)). Liver Function Tests: Recent Labs  Lab 12/06/21 0507 12/07/21 0522 12/08/21 0418 12/09/21 1034 12/10/21 0511  AST 44* 50* 58* 53* 48*  ALT 35 40 42 38 39  ALKPHOS 72 75 80 78 75  BILITOT 0.8 0.7 0.7 0.8 0.8  PROT 6.5 6.4* 6.6 6.5 6.5  ALBUMIN 2.8* 2.7* 2.7* 2.7* 2.6*   No results for input(s): "LIPASE", "AMYLASE" in the last 168 hours. No results for input(s): "AMMONIA" in the last 168 hours. Coagulation Profile: No results for input(s): "INR", "PROTIME" in the last 168 hours. Cardiac Enzymes: No results for input(s): "CKTOTAL", "CKMB", "CKMBINDEX", "TROPONINI" in the last 168 hours. BNP (last 3 results) No results for input(s): "PROBNP" in the last 8760 hours. HbA1C: No results for input(s): "HGBA1C" in the last 72 hours. CBG: Recent Labs  Lab 12/08/21 2137 12/09/21 0749  GLUCAP 94 82   Lipid Profile: No results for input(s): "CHOL", "HDL", "LDLCALC", "TRIG", "CHOLHDL", "LDLDIRECT" in the last 72 hours. Thyroid Function Tests: No results for input(s): "TSH", "T4TOTAL", "FREET4", "T3FREE", "THYROIDAB" in the last 72 hours. Anemia Panel: No results for input(s): "VITAMINB12", "FOLATE", "FERRITIN", "TIBC", "IRON", "RETICCTPCT" in the last 72 hours. Sepsis Labs: No results for input(s): "PROCALCITON", "LATICACIDVEN" in the last 168 hours.  Recent Results (from the past 240 hour(s))  Urine Culture     Status: None   Collection Time: 11/30/21  9:59 PM   Specimen: Urine, Catheterized  Result Value Ref Range Status   Specimen Description   Final    URINE, CATHETERIZED Performed  at [facility] [address] [place], [place] [address]    Special Requests   Final    NONE Performed at [facility], [place] [address] [place], [place] [address]    Culture   Final    NO GROWTH Performed at [facility], [place] [address] [name], [place] [address]    Report Status 12/02/2021 FINAL  Final    Radiology Studies: No results found.  Scheduled Meds:  amLODipine  5 mg Oral Daily   aspirin EC  81 mg Oral Daily   carvedilol  6.25 mg Oral BID   Chlorhexidine Gluconate Cloth  6 each Topical Q0600   cholecalciferol  1,000 Units Oral Daily   cyanocobalamin  1,000 mcg Oral Daily   folic acid  1 mg Oral Daily   heparin  5,000 Units Subcutaneous Q8H   latanoprost  1 drop Both Eyes QHS   tamsulosin  0.4 mg Oral Daily   Continuous Infusions:   LOS: 10 days   Raiford Noble, DO [facility] Available via Epic secure chat 7am-7pm After these hours, please refer to coverage provider listed on [email or web address] 12/10/2021, 5:01 PM

## 2021-12-10 NOTE — Progress Notes (Signed)
  Subjective: Patient alert and conversive today.  Complains of pain  Objective: Vital signs in last 24 hours: Temp:  [97.4 F (36.3 C)-97.6 F (36.4 C)] 97.6 F (36.4 C) (12/05 0558) Pulse Rate:  [51-57] 51 (12/05 1212) Resp:  [18-20] 18 (12/05 1212) BP: (123-163)/(70-81) 123/73 (12/05 1212) SpO2:  [99 %-100 %] 100 % (12/05 1212)  Intake/Output from previous day: 12/04 0701 - 12/05 0700 In: 800 [P.O.:800] Out: 3300 [Urine:3300] Intake/Output this shift: No intake/output data recorded.  Physical Exam:  General: Alert and oriented Scrotal wound covered with gauze dressing no significant discharge noted.  Continues with scrotal edema.  Foley draining clear urine  Lab Results: Recent Labs    12/08/21 0418 12/09/21 1034 12/10/21 0511  HGB 9.9* 8.8* 9.4*  HCT 31.2* 27.4* 29.4*   BMET Recent Labs    12/09/21 1034 12/10/21 0511  NA 141 139  K 3.7 3.5  CL 111 109  CO2 21* 22  GLUCOSE 99 85  BUN 25* 22  CREATININE 1.35* 1.40*  CALCIUM 8.7* 9.0     Studies/Results: No results found.  Assessment/Plan: 1.  Status post bilateral hydrocelectomy with penoscrotal edema, superficial wound dehiscence Plan/recommendation continue local wound care continue Foley.    LOS: 10 days   CHINMAY SQUIER 12/10/2021, 12:53 PM

## 2021-12-10 NOTE — Progress Notes (Signed)
Occupational Therapy Treatment Patient Details Name: Terrance Miller MRN: [id number] DOB: 1928/02/03 Today's Date: 12/10/2021   History of present illness Patient is a 88 year old male who presented with altered mentation and difficulty with urination, Urology was consulted for Foley placement due to severe edema. recent hydrocele s/p hydrocelectomy 11/15. PMH CAD, DM II, HLD, HTN   OT comments  Patient appeared motivated towards therapy participation, with the session emphasizing functional strengthening and functional transfers, needed to facilitate progressive ADL participation. He required mod assist for supine to sit, total assist for sock management seated EOB, and min assist x2 to stand using a RW. He ambulated a short distance in his room using a RW for support, requiring intermittent cues to avoid environmental obstacles, as he would occasionally bump into things (possible vision impairment). He indicated having "a little" scrotal pain (scrotal swelling noted). He will continue to benefit from further OT services to maximize his safety and independence with self-care tasks.    Recommendations for follow up therapy are one component of a multi-disciplinary discharge planning process, led by the attending physician.  Recommendations may be updated based on patient status, additional functional criteria and insurance authorization.    Follow Up Recommendations  Skilled nursing-short term rehab (<3 hours/day)     Assistance Recommended at Discharge Frequent or constant Supervision/Assistance  Patient can return home with the following  Direct supervision/assist for financial management;Help with stairs or ramp for entrance;Direct supervision/assist for medications management;Assistance with cooking/housework;Assist for transportation;A lot of help with bathing/dressing/bathroom;A lot of help with walking and/or transfers   Equipment Recommendations  Other (comment) (defer to next leve of  care)       Precautions / Restrictions Precautions Precautions: Fall Precaution Comments: severe scrotal edema/hydrocele, visual deficits? Restrictions Weight Bearing Restrictions: No       Mobility Bed Mobility Overal bed mobility: Needs Assistance Bed Mobility: Supine to Sit, Sit to Supine     Supine to sit: Mod assist, HOB elevated Sit to supine: Mod assist   General bed mobility comments: verbal cues for hand placement and self assist, including reaching for bed rail and pushing with BLE    Transfers Overall transfer level: Needs assistance Equipment used: Rolling walker (2 wheels) Transfers: Sit to/from Stand Sit to Stand: Min assist, +2 safety/equipment, From elevated surface                     ADL either performed or assessed with clinical judgement   ADL Overall ADL's : Needs assistance/impaired Eating/Feeding: Set up;Sitting Eating/Feeding Details (indicate cue type and reason): based on clinical judgement Grooming: Set up;Sitting Grooming Details (indicate cue type and reason): simulated edge of bed         Upper Body Dressing : Moderate assistance;Sitting   Lower Body Dressing: Total assistance Lower Body Dressing Details (indicate cue type and reason): for sock management seated EOB                Cognition Arousal/Alertness: Awake/alert Behavior During Therapy: WFL for tasks assessed/performed          Following Commands: Follows one step commands consistently     Problem Solving: Slow processing, Requires verbal cues, Requires tactile cues, Decreased initiation           Pertinent Vitals/ Pain       Pain Assessment Pain Assessment: Faces Pain Score: 3  Pain Location: scrotum; he reported " a little" pain Pain Intervention(s): Limited activity within patient's tolerance, Repositioned  Frequency  Min 2X/week        Progress Toward Goals  OT Goals(current goals can now be found in the care plan section)   Progress towards OT goals: Progressing toward goals  Acute Rehab OT Goals Patient Stated Goal: not specifically stated OT Goal Formulation: With patient Time For Goal Achievement: 12/15/21 Potential to Achieve Goals: Good  Plan Discharge plan remains appropriate       AM-PAC OT "6 Clicks" Daily Activity     Outcome Measure   Help from another person eating meals?: None Help from another person taking care of personal grooming?: A Little Help from another person toileting, which includes using toliet, bedpan, or urinal?: A Lot Help from another person bathing (including washing, rinsing, drying)?: A Lot Help from another person to put on and taking off regular upper body clothing?: A Lot Help from another person to put on and taking off regular lower body clothing?: Total 6 Click Score: 14    End of Session Equipment Utilized During Treatment: Rolling walker (2 wheels)  OT Visit Diagnosis: Unsteadiness on feet (R26.81);Pain   Activity Tolerance Patient tolerated treatment well   Patient Left with call bell/phone within reach;in bed;with bed alarm set   Nurse Communication Mobility status        Time: 6381-7711 OT Time Calculation (min): 22 min  Charges: OT General Charges $OT Visit: 1 Visit OT Treatments $Therapeutic Activity: 8-22 mins     Leota Sauers, OTR/L 12/10/2021, 4:56 PM

## 2021-12-10 NOTE — TOC Progression Note (Addendum)
Transition of Care [facility]) - Progression Note    Patient Details  Name: Terrance Miller MRN: [id number] Date of Birth: Jun 04, 1928  Transition of Care [facility]) CM/SW Contact  Leeroy Cha, RN Phone Number: 12/10/2021, 7:36 AM  Clinical Narrative:    [phone] maxine at phone number in system.  No answer and voice mail not set up. Phone number for alternate contact is a non working number. [phone] the [email or web address] chosen [facility] in [place]. Authorization for [employer] started at 0855. Expected Discharge Plan: Cross Barriers to Discharge: Continued Medical Work up  Expected Discharge Plan and Services Expected Discharge Plan: [place]   Discharge Planning Services: CM Consult Post Acute Care Choice: [place] Living arrangements for the past 2 months: Single Family Home                                       Social Determinants of Health (SDOH) Interventions    Readmission Risk Interventions   No data to display

## 2021-12-11 DIAGNOSIS — R338 Other retention of urine: Secondary | ICD-10-CM | POA: Diagnosis not present

## 2021-12-11 DIAGNOSIS — G9341 Metabolic encephalopathy: Secondary | ICD-10-CM | POA: Diagnosis not present

## 2021-12-11 DIAGNOSIS — N433 Hydrocele, unspecified: Secondary | ICD-10-CM | POA: Diagnosis not present

## 2021-12-11 LAB — COMPREHENSIVE METABOLIC PANEL
ALT: 30 U/L (ref 0–44)
AST: 38 U/L (ref 15–41)
Albumin: 2.6 g/dL — ABNORMAL LOW (ref 3.5–5.0)
Alkaline Phosphatase: 71 U/L (ref 38–126)
Anion gap: 7 (ref 5–15)
BUN: 25 mg/dL — ABNORMAL HIGH (ref 8–23)
CO2: 25 mmol/L (ref 22–32)
Calcium: 8.6 mg/dL — ABNORMAL LOW (ref 8.9–10.3)
Chloride: 107 mmol/L (ref 98–111)
Creatinine, Ser: 1.39 mg/dL — ABNORMAL HIGH (ref 0.61–1.24)
GFR, Estimated: 48 mL/min — ABNORMAL LOW (ref >60)
Glucose, Bld: 153 mg/dL — ABNORMAL HIGH (ref 70–99)
Potassium: 3.3 mmol/L — ABNORMAL LOW (ref 3.5–5.1)
Sodium: 139 mmol/L (ref 135–145)
Total Bilirubin: 0.5 mg/dL (ref 0.3–1.2)
Total Protein: 6.6 g/dL (ref 6.5–8.1)

## 2021-12-11 LAB — CBC WITH DIFFERENTIAL/PLATELET
Abs Immature Granulocytes: 0.01 10*3/uL (ref 0.00–0.07)
Basophils Absolute: 0 10*3/uL (ref 0.0–0.1)
Basophils Relative: 1 %
Eosinophils Absolute: 0.1 10*3/uL (ref 0.0–0.5)
Eosinophils Relative: 2 %
HCT: 26.9 % — ABNORMAL LOW (ref 39.0–52.0)
Hemoglobin: 8.9 g/dL — ABNORMAL LOW (ref 13.0–17.0)
Immature Granulocytes: 0 %
Lymphocytes Relative: 15 %
Lymphs Abs: 0.6 10*3/uL — ABNORMAL LOW (ref 0.7–4.0)
MCH: 34.9 pg — ABNORMAL HIGH (ref 26.0–34.0)
MCHC: 33.1 g/dL (ref 30.0–36.0)
MCV: 105.5 fL — ABNORMAL HIGH (ref 80.0–100.0)
Monocytes Absolute: 0.6 10*3/uL (ref 0.1–1.0)
Monocytes Relative: 14 %
Neutro Abs: 2.8 10*3/uL (ref 1.7–7.7)
Neutrophils Relative %: 68 %
Platelets: 174 10*3/uL (ref 150–400)
RBC: 2.55 MIL/uL — ABNORMAL LOW (ref 4.22–5.81)
RDW: 21 % — ABNORMAL HIGH (ref 11.5–15.5)
WBC: 4.1 10*3/uL (ref 4.0–10.5)
nRBC: 1 % — ABNORMAL HIGH (ref 0.0–0.2)

## 2021-12-11 LAB — PHOSPHORUS: Phosphorus: 3.4 mg/dL (ref 2.5–4.6)

## 2021-12-11 LAB — MAGNESIUM: Magnesium: 2.1 mg/dL (ref 1.7–2.4)

## 2021-12-11 NOTE — Progress Notes (Signed)
Occupational Therapy Treatment Patient Details Name: Terrance Miller MRN: [id number] DOB: 03-23-1928 Today's Date: 12/11/2021   History of present illness Patient is a 88 year old male who presented with altered mentation and difficulty with urination, Urology was consulted for Foley placement due to severe edema. recent hydrocele s/p hydrocelectomy 11/15. PMH CAD, DM II, HLD, HTN   OT comments  The patient continues to be motivated towards therapy. He required increased time and effort, as well as mod assist with cues for best transfer technique, in order to perform supine to sit. He stood from the elevated bed surface, requiring support from a RW, then was assisted to the bedside chair. He initiated self-feeding seated at chair level, requiring assist to open packets, remove lids, and apply condiments (possible vision impairment). He will continue to benefit from further OT services to facilitate improved ADL performance and to decrease the risk for further weakness & deconditioning.    Recommendations for follow up therapy are one component of a multi-disciplinary discharge planning process, led by the attending physician.  Recommendations may be updated based on patient status, additional functional criteria and insurance authorization.    Follow Up Recommendations  Skilled nursing-short term rehab (<3 hours/day)     Assistance Recommended at Discharge Frequent or constant Supervision/Assistance  Patient can return home with the following  Direct supervision/assist for financial management;Help with stairs or ramp for entrance;Direct supervision/assist for medications management;Assistance with cooking/housework;Assist for transportation;A lot of help with bathing/dressing/bathroom;A lot of help with walking and/or transfers   Equipment Recommendations  Other (comment) (to be determined pending progress at next setting)       Precautions / Restrictions Precautions Precautions:  Fall Restrictions Weight Bearing Restrictions: No       Mobility Bed Mobility Overal bed mobility: Needs Assistance Bed Mobility: Supine to Sit     Supine to sit: Mod assist, HOB elevated     General bed mobility comments: verbal cues for hand placement and self assist, including reaching for bed rail and pushing with BLE, required increased time and effort    Transfers Overall transfer level: Needs assistance Equipment used: Rolling walker (2 wheels) Transfers: Sit to/from Stand Sit to Stand: Min assist, From elevated surface Stand pivot transfers: Min assist         General transfer comment: required occasional verbal cues for walker placement and reaching back for chair to sitting         ADL either performed or assessed with clinical judgement   ADL Overall ADL's : Needs assistance/impaired Eating/Feeding: Set up;Sitting Eating/Feeding Details (indicate cue type and reason): Pt initiated self-feeding in sitting at chair level. He required assist to open packets, remove lids, and apply condiments. Grooming: Set up;Sitting           Upper Body Dressing : Moderate assistance;Sitting   Lower Body Dressing: Total assistance                                 Cognition Arousal/Alertness: Awake/alert            Following Commands: Follows one step commands consistently                           Pertinent Vitals/ Pain       Pain Assessment Pain Assessment: No/denies pain         Frequency  Min 2X/week  Progress Toward Goals  OT Goals(current goals can now be found in the care plan section)  Progress towards OT goals: Progressing toward goals  Acute Rehab OT Goals Patient Stated Goal: not specifically stated OT Goal Formulation: With patient Time For Goal Achievement: 12/15/21 Potential to Achieve Goals: Good  Plan Discharge plan remains appropriate       AM-PAC OT "6 Clicks" Daily Activity     Outcome  Measure   Help from another person eating meals?: None Help from another person taking care of personal grooming?: A Little Help from another person toileting, which includes using toliet, bedpan, or urinal?: A Lot Help from another person bathing (including washing, rinsing, drying)?: A Lot Help from another person to put on and taking off regular upper body clothing?: A Lot Help from another person to put on and taking off regular lower body clothing?: A Lot 6 Click Score: 15    End of Session Equipment Utilized During Treatment: Rolling walker (2 wheels)  OT Visit Diagnosis: Unsteadiness on feet (R26.81);Muscle weakness (generalized) (M62.81)   Activity Tolerance  The patient tolerated the session well   Patient Left with call bell/phone within reach;in chair;with chair alarm set   Nurse Communication Mobility status        Time: 5427-0623 OT Time Calculation (min): 20 min  Charges: OT General Charges $OT Visit: 1 Visit OT Treatments $Therapeutic Activity: 8-22 mins     Leota Sauers, OTR/L 12/11/2021, 1:03 PM

## 2021-12-11 NOTE — Care Management Important Message (Signed)
Important Message  Patient Details IM Letter placed in Patient's room. Name: Terrance Miller MRN: [id number] Date of Birth: 1928/03/23   Medicare Important Message Given:  Yes     Kerin Salen 12/11/2021, 1:15 PM

## 2021-12-11 NOTE — Consult Note (Signed)
[facility] Nurse Consult Note: Reason for Consult: reconsulted for recommendations on scrotal wound care.  Urology has implemented orders for topical dressing.  Foam dressing has been used.  Wound assessed and will make additional recommendations for bedside staff to complete daily.  Wound type: surgical dehiscence in the setting of scrotal edema.  Foley in place and draining dark yellow urine Pressure Injury POA: NA Measurement: posterior aspect scrotum, grossly edematous  1.5 cm x 1.8 cm x 0.2 cm  Wound ZDG:LOVF pink, moist Drainage (amount, consistency, odor) minimal serosanguinous  Periwound:edema to scrotum  Foley in place Dressing procedure/placement/frequency: Keep area clean and dry. Bedside RN to cleanse with NS and pat dry. Apply Xeroform gauze to open area.  Cover with 2x2 silicone foam.  Change daily.  Keep scrotum elevated.  Will not follow at this time.  Please re-consult if needed.  Estrellita Ludwig MSN, RN, FNP-BC CWON Wound, Ostomy, Continence Nurse [facility] [phone] Pager [phone]

## 2021-12-11 NOTE — Progress Notes (Signed)
Progress Note    Terrance Miller   RJJ:[id number]  DOB: 1928-03-19  DOA: 11/30/2021     11 PCP: Terrance Spikes, DO  Initial CC: AMS, urinary retention  Hospital Course: Terrance Miller is a 88 yo male with PMH recent hydrocele s/p hydrocelectomy 11/15, CAD, DM II, HLD, HTN who presented with altered mentation and difficulty with urination.  Patient had a Penrose drain after surgery which was discontinued prior to admission by daughter.  Due to developing difficulty with oral intake and worsening weakness and confusion he was brought to the hospital for further evaluation. Urology was consulted for Foley placement due to severe edema.  Interval History:  Patient known to me from last time I was on service a little over 1 week ago. His mentation is about the same and this now appears to be his probable baseline. Scrotal edema has shown some improvement since last seen.  PT has recommended SNF and insurance authorization is pending.  Assessment and Plan:  Acute metabolic encephalopathy -Most likely in the setting of hypernatremia, dehydration; chronic component? -CT head with no acute intracranial process identified -DC fluids for now given severe scrotal edema -Continue to hold Seroquel -UA negative for evidence of infection -This appears to be his baseline   B/L large hydrocele Urinary Retention - s/p bilateral hydrocelectomy by Terrance Miller 11/15 -Foley placed by urology on admission due to severe scrotal edema - foley placed 11/25 and is recommended to remain in place until edema resolves and outpatient followup - UA negative for signs of infection after catheter placed.  Therefore discontinued Rocephin -Appears to have some dehiscence of wound with fluid leak noted on 12/02/2021. Discussed with urology. Not large enough to pack yet so continue cleansing and covering with dry gauze.   Hypernatremia - resolved  - Some improvement with fluids - Hold fluids for now and continue  trending BMP   Transaminitis -Likely due to above, continue to trend  -Hold pravastatin   CKD stage IIIb -avoid nephrotoxic medications   Hyperlipidemia LDL goal <70 - hold statin   Hypertension -Continue amlodipine, Coreg   obesity -With BMI of 32   Old records reviewed in assessment of this patient  Antimicrobials: Rocephin 11/30/2021 x 1  DVT prophylaxis:  heparin injection 5,000 Units Start: 11/30/21 2245 SCDs Start: 11/30/21 2159   Code Status:   Code Status: Full Code  Mobility Assessment (last 72 hours)     Mobility Assessment     Row Name 12/11/21 1221 12/11/21 0930 12/10/21 2100 12/10/21 1621 12/10/21 1202   Does patient have an order for bedrest or is patient medically unstable -- No - Continue assessment No - Continue assessment -- --   What is the highest level of mobility based on the progressive mobility assessment? Level 4 (Walks with assist in room) - Balance while marching in place and cannot step forward and back - Complete Level 4 (Walks with assist in room) - Balance while marching in place and cannot step forward and back - Complete Level 4 (Walks with assist in room) - Balance while marching in place and cannot step forward and back - Complete Level 4 (Walks with assist in room) - Balance while marching in place and cannot step forward and back - Complete Level 5 (Walks with assist in room/hall) - Balance while stepping forward/back and can walk in room with assist - Complete   Is the above level different from baseline mobility prior to current illness? -- Yes - Recommend PT  order Yes - Recommend PT order -- --    [place] Name 12/10/21 0800 12/09/21 2226 12/08/21 2210       Does patient have an order for bedrest or is patient medically unstable No - Continue assessment No - Continue assessment No - Continue assessment     What is the highest level of mobility based on the progressive mobility assessment? Level 5 (Walks with assist in room/hall) - Balance  while stepping forward/back and can walk in room with assist - Complete Level 2 (Chairfast) - Balance while sitting on edge of bed and cannot stand Level 2 (Chairfast) - Balance while sitting on edge of bed and cannot stand     Is the above level different from baseline mobility prior to current illness? Yes - Recommend PT order Yes - Recommend PT order Yes - Recommend PT order              Barriers to discharge:  Disposition Plan:  SNF once medically stable (3-4 days) Status is: Inpt  Objective: Blood pressure 130/66, pulse (!) 51, temperature (!) 95.9 F (35.5 C), temperature source Axillary, resp. rate 20, height '5\' 6"'$  (1.676 m), weight 108.9 kg, SpO2 100 %.  Examination:  Physical Exam Constitutional:      General: He is not in acute distress.    Appearance: Normal appearance.  HENT:     Head: Normocephalic.     Mouth/Throat:     Mouth: Mucous membranes are moist.  Eyes:     Extraocular Movements: Extraocular movements intact.  Cardiovascular:     Rate and Rhythm: Normal rate and regular rhythm.  Pulmonary:     Effort: Pulmonary effort is normal.     Breath sounds: Normal breath sounds.  Abdominal:     General: Bowel sounds are normal. There is no distension.     Palpations: Abdomen is soft.     Tenderness: There is no abdominal tenderness.  Genitourinary:    Comments: Grossly edematous scrotum appreciated showing some improvement now with partial visualization of penis. Foley catheter emerging from edematous scrotum.  No tenderness to palpation.  Musculoskeletal:        General: Normal range of motion.     Cervical back: Normal range of motion and neck supple.  Skin:    General: Skin is warm and dry.  Neurological:     Mental Status: He is alert. He is disoriented.     Comments: Follows commands; moves all 4 extremities. Only oriented to name  Psychiatric:        Mood and Affect: Mood normal.    Consultants:  Urology  Procedures:    Data Reviewed: Results  for orders placed or performed during the hospital encounter of 11/30/21 (from the past 24 hour(s))  CBC with Differential/Platelet     Status: Abnormal   Collection Time: 12/11/21 12:05 PM  Result Value Ref Range   WBC 4.1 4.0 - 10.5 K/uL   RBC 2.55 (L) 4.22 - 5.81 MIL/uL   Hemoglobin 8.9 (L) 13.0 - 17.0 g/dL   HCT 26.9 (L) 39.0 - 52.0 %   MCV 105.5 (H) 80.0 - 100.0 fL   MCH 34.9 (H) 26.0 - 34.0 pg   MCHC 33.1 30.0 - 36.0 g/dL   RDW 21.0 (H) 11.5 - 15.5 %   Platelets 174 150 - 400 K/uL   nRBC 1.0 (H) 0.0 - 0.2 %   Neutrophils Relative % 68 %   Neutro Abs 2.8 1.7 - 7.7 K/uL  Lymphocytes Relative 15 %   Lymphs Abs 0.6 (L) 0.7 - 4.0 K/uL   Monocytes Relative 14 %   Monocytes Absolute 0.6 0.1 - 1.0 K/uL   Eosinophils Relative 2 %   Eosinophils Absolute 0.1 0.0 - 0.5 K/uL   Basophils Relative 1 %   Basophils Absolute 0.0 0.0 - 0.1 K/uL   Immature Granulocytes 0 %   Abs Immature Granulocytes 0.01 0.00 - 0.07 K/uL  Comprehensive metabolic panel     Status: Abnormal   Collection Time: 12/11/21 12:05 PM  Result Value Ref Range   Sodium 139 135 - 145 mmol/L   Potassium 3.3 (L) 3.5 - 5.1 mmol/L   Chloride 107 98 - 111 mmol/L   CO2 25 22 - 32 mmol/L   Glucose, Bld 153 (H) 70 - 99 mg/dL   BUN 25 (H) 8 - 23 mg/dL   Creatinine, Ser 1.39 (H) 0.61 - 1.24 mg/dL   Calcium 8.6 (L) 8.9 - 10.3 mg/dL   Total Protein 6.6 6.5 - 8.1 g/dL   Albumin 2.6 (L) 3.5 - 5.0 g/dL   AST 38 15 - 41 U/L   ALT 30 0 - 44 U/L   Alkaline Phosphatase 71 38 - 126 U/L   Total Bilirubin 0.5 0.3 - 1.2 mg/dL   GFR, Estimated 48 (L) >60 mL/min   Anion gap 7 5 - 15  Phosphorus     Status: None   Collection Time: 12/11/21 12:05 PM  Result Value Ref Range   Phosphorus 3.4 2.5 - 4.6 mg/dL  Magnesium     Status: None   Collection Time: 12/11/21 12:05 PM  Result Value Ref Range   Magnesium 2.1 1.7 - 2.4 mg/dL    I have Reviewed nursing notes, Vitals, and Lab results since pt's last encounter. Pertinent lab results :  see above I have ordered test including BMP, CBC, Mg I have reviewed the last note from staff over past 24 hours I have discussed pt's care plan and test results with nursing staff, case manager  Time spent: Greater than 50% of the 55 minute visit was spent in counseling/coordination of care for the patient as laid out in the A&P.    LOS: 11 days   Dwyane Dee, MD [facility] 12/11/2021, 4:51 PM

## 2021-12-11 NOTE — TOC Progression Note (Signed)
Transition of Care [facility]) - Progression Note    Patient Details  Name: Terrance Miller MRN: [id number] Date of Birth: Oct 30, 1928  Transition of Care [facility]) CM/SW Contact  Leeroy Cha, RN Phone Number: 12/11/2021, 3:01 PM  Clinical Narrative:    Additional information sent to [facility] at 1400 today. 1500 still pending in the system.   Expected Discharge Plan: [place] Barriers to Discharge: Continued Medical Work up  Expected Discharge Plan and Services Expected Discharge Plan: [place]   Discharge Planning Services: CM Consult Post Acute Care Choice: [place] Living arrangements for the past 2 months: Single Family Home                                       Social Determinants of Health (SDOH) Interventions    Readmission Risk Interventions   No data to display

## 2021-12-12 DIAGNOSIS — I2510 Atherosclerotic heart disease of native coronary artery without angina pectoris: Secondary | ICD-10-CM | POA: Diagnosis not present

## 2021-12-12 DIAGNOSIS — R278 Other lack of coordination: Secondary | ICD-10-CM | POA: Diagnosis not present

## 2021-12-12 DIAGNOSIS — N433 Hydrocele, unspecified: Secondary | ICD-10-CM | POA: Diagnosis not present

## 2021-12-12 DIAGNOSIS — Z9181 History of falling: Secondary | ICD-10-CM | POA: Diagnosis not present

## 2021-12-12 DIAGNOSIS — U071 COVID-19: Secondary | ICD-10-CM | POA: Diagnosis not present

## 2021-12-12 DIAGNOSIS — G629 Polyneuropathy, unspecified: Secondary | ICD-10-CM | POA: Diagnosis not present

## 2021-12-12 DIAGNOSIS — R262 Difficulty in walking, not elsewhere classified: Secondary | ICD-10-CM | POA: Diagnosis not present

## 2021-12-12 DIAGNOSIS — Z7401 Bed confinement status: Secondary | ICD-10-CM | POA: Diagnosis not present

## 2021-12-12 DIAGNOSIS — R531 Weakness: Secondary | ICD-10-CM | POA: Diagnosis not present

## 2021-12-12 DIAGNOSIS — L8993 Pressure ulcer of unspecified site, stage 3: Secondary | ICD-10-CM | POA: Diagnosis not present

## 2021-12-12 DIAGNOSIS — G929 Unspecified toxic encephalopathy: Secondary | ICD-10-CM | POA: Diagnosis not present

## 2021-12-12 DIAGNOSIS — Z743 Need for continuous supervision: Secondary | ICD-10-CM | POA: Diagnosis not present

## 2021-12-12 DIAGNOSIS — D649 Anemia, unspecified: Secondary | ICD-10-CM | POA: Diagnosis not present

## 2021-12-12 DIAGNOSIS — D472 Monoclonal gammopathy: Secondary | ICD-10-CM | POA: Diagnosis not present

## 2021-12-12 DIAGNOSIS — E119 Type 2 diabetes mellitus without complications: Secondary | ICD-10-CM | POA: Diagnosis not present

## 2021-12-12 DIAGNOSIS — I252 Old myocardial infarction: Secondary | ICD-10-CM | POA: Diagnosis not present

## 2021-12-12 DIAGNOSIS — R339 Retention of urine, unspecified: Secondary | ICD-10-CM | POA: Diagnosis not present

## 2021-12-12 DIAGNOSIS — R279 Unspecified lack of coordination: Secondary | ICD-10-CM | POA: Diagnosis not present

## 2021-12-12 DIAGNOSIS — H409 Unspecified glaucoma: Secondary | ICD-10-CM | POA: Diagnosis not present

## 2021-12-12 DIAGNOSIS — F0670 Mild neurocognitive disorder due to known physiological condition without behavioral disturbance: Secondary | ICD-10-CM | POA: Diagnosis not present

## 2021-12-12 DIAGNOSIS — R239 Unspecified skin changes: Secondary | ICD-10-CM | POA: Diagnosis not present

## 2021-12-12 DIAGNOSIS — I517 Cardiomegaly: Secondary | ICD-10-CM | POA: Diagnosis not present

## 2021-12-12 DIAGNOSIS — D7589 Other specified diseases of blood and blood-forming organs: Secondary | ICD-10-CM | POA: Diagnosis not present

## 2021-12-12 DIAGNOSIS — E559 Vitamin D deficiency, unspecified: Secondary | ICD-10-CM | POA: Diagnosis not present

## 2021-12-12 DIAGNOSIS — R338 Other retention of urine: Secondary | ICD-10-CM | POA: Diagnosis not present

## 2021-12-12 DIAGNOSIS — Z87891 Personal history of nicotine dependence: Secondary | ICD-10-CM | POA: Diagnosis not present

## 2021-12-12 DIAGNOSIS — R0989 Other specified symptoms and signs involving the circulatory and respiratory systems: Secondary | ICD-10-CM | POA: Diagnosis not present

## 2021-12-12 DIAGNOSIS — R4189 Other symptoms and signs involving cognitive functions and awareness: Secondary | ICD-10-CM | POA: Diagnosis not present

## 2021-12-12 DIAGNOSIS — G9341 Metabolic encephalopathy: Secondary | ICD-10-CM | POA: Diagnosis not present

## 2021-12-12 DIAGNOSIS — M6250 Muscle wasting and atrophy, not elsewhere classified, unspecified site: Secondary | ICD-10-CM | POA: Diagnosis not present

## 2021-12-12 DIAGNOSIS — M6281 Muscle weakness (generalized): Secondary | ICD-10-CM | POA: Diagnosis not present

## 2021-12-12 DIAGNOSIS — N179 Acute kidney failure, unspecified: Secondary | ICD-10-CM | POA: Diagnosis not present

## 2021-12-12 DIAGNOSIS — R609 Edema, unspecified: Secondary | ICD-10-CM | POA: Diagnosis not present

## 2021-12-12 DIAGNOSIS — H538 Other visual disturbances: Secondary | ICD-10-CM | POA: Diagnosis not present

## 2021-12-12 DIAGNOSIS — E785 Hyperlipidemia, unspecified: Secondary | ICD-10-CM | POA: Diagnosis not present

## 2021-12-12 DIAGNOSIS — N1832 Chronic kidney disease, stage 3b: Secondary | ICD-10-CM | POA: Diagnosis not present

## 2021-12-12 DIAGNOSIS — E871 Hypo-osmolality and hyponatremia: Secondary | ICD-10-CM | POA: Diagnosis not present

## 2021-12-12 DIAGNOSIS — J069 Acute upper respiratory infection, unspecified: Secondary | ICD-10-CM | POA: Diagnosis not present

## 2021-12-12 DIAGNOSIS — R509 Fever, unspecified: Secondary | ICD-10-CM | POA: Diagnosis not present

## 2021-12-12 DIAGNOSIS — I10 Essential (primary) hypertension: Secondary | ICD-10-CM | POA: Diagnosis not present

## 2021-12-12 DIAGNOSIS — M6259 Muscle wasting and atrophy, not elsewhere classified, multiple sites: Secondary | ICD-10-CM | POA: Diagnosis not present

## 2021-12-12 DIAGNOSIS — R404 Transient alteration of awareness: Secondary | ICD-10-CM | POA: Diagnosis not present

## 2021-12-12 DIAGNOSIS — F01B2 Vascular dementia, moderate, with psychotic disturbance: Secondary | ICD-10-CM | POA: Diagnosis not present

## 2021-12-12 DIAGNOSIS — E870 Hyperosmolality and hypernatremia: Secondary | ICD-10-CM | POA: Diagnosis not present

## 2021-12-12 DIAGNOSIS — E11628 Type 2 diabetes mellitus with other skin complications: Secondary | ICD-10-CM | POA: Diagnosis not present

## 2021-12-12 MED ORDER — AMLODIPINE BESYLATE 5 MG PO TABS: 5.0000 mg | ORAL_TABLET | Freq: Every day | ORAL | Status: DC

## 2021-12-12 MED ORDER — FOLIC ACID 1 MG PO TABS: 1.0000 mg | ORAL_TABLET | Freq: Every day | ORAL | Status: DC

## 2021-12-12 NOTE — Discharge Summary (Signed)
Physician Discharge Summary   Terrance Miller IHK:[id number] DOB: 10/17/1928 DOA: 11/30/2021  PCP: Terrance Spikes, DO  Admit date: 11/30/2021 Discharge date:  12/12/2021 Barriers to discharge: none  Admitted From: Home Disposition:  SNF Discharging physician: Terrance Dee, MD  Recommendations for Outpatient Follow-up:  Follow up with urology Continue foley until urology followup If scrotal wound worsens, may need packing still Consider palliative care referral if any further clinical decline  Home Health:  Equipment/Devices:   Discharge Condition: stable CODE STATUS: Full Diet recommendation:  Diet Orders (From admission, onward)     Start     Ordered   12/12/21 0000  Diet Carb Modified        12/12/21 1203   12/01/21 1004  Diet Carb Modified Fluid consistency: Thin; Room service appropriate? Yes  Diet effective now       Question Answer Comment  Diet-HS Snack? Nothing   Calorie Level Medium 1600-2000   Fluid consistency: Thin   Room service appropriate? Yes      12/01/21 1003            Hospital Course: Terrance Miller is a 88 yo male with PMH recent hydrocele s/p hydrocelectomy 11/15, CAD, DM II, HLD, HTN who presented with altered mentation and difficulty with urination.  Patient had a Penrose drain after surgery which was discontinued prior to admission by daughter.  Due to developing difficulty with oral intake and worsening weakness and confusion he was brought to the hospital for further evaluation. Urology was consulted for Foley placement due to severe edema.  Assessment and Plan:  Acute metabolic encephalopathy -Most likely in the setting of hypernatremia, dehydration; chronic component? -CT head with no acute intracranial process identified -DC fluids for now given severe scrotal edema -Continue to hold Seroquel -UA negative for evidence of infection -This appears to be his baseline   B/L large hydrocele Urinary Retention - s/p bilateral hydrocelectomy  by Terrance Miller 11/15 -Foley placed by urology on admission due to severe scrotal edema - foley placed 11/25 and is recommended to remain in place until edema resolves and outpatient followup - UA negative for signs of infection after catheter placed.  Therefore discontinued Rocephin -Appears to have some dehiscence of wound with fluid leak noted on 12/02/2021. Discussed with urology. Not large enough to pack yet so continue cleansing and dressing changes - outpatient followup with urology    Hypernatremia - resolved  - Some improvement with fluids   Transaminitis -Likely due to above, continue to trend  -Hold pravastatin   CKD stage IIIb -avoid nephrotoxic medications   Hyperlipidemia LDL goal <70 - hold statin - no further mortality benefit; d/c statin   Hypertension -Continue amlodipine, Coreg   obesity -With BMI of 32    Principal Diagnosis: Acute metabolic encephalopathy  Discharge Diagnoses: Active Hospital Problems   Diagnosis Date Noted   Acute metabolic encephalopathy 75/64/3329    Priority: 1.   Hydrocele 10/26/2020    Priority: 1.   Acute urinary retention 12/01/2021    Priority: 2.   Stage 3b chronic kidney disease ([place]) 11/05/2021   MGUS (monoclonal gammopathy of unknown significance) 11/05/2021   Hypertension 09/28/2013   Hyperlipidemia LDL goal <70 09/28/2013   CAD (coronary artery disease) 09/28/2013    Resolved Hospital Problems  No resolved problems to display.     Discharge Instructions     Diet Carb Modified   Complete by: As directed    Discharge wound care:   Complete by: As directed  Clean posterior scrotal wound with normal saline and pat dry. Apply xeroform gauze to open area. Cover with 2x2 silicone foam. Change daily. Keep scrotum elevated as able.   Increase activity slowly   Complete by: As directed       Allergies as of 12/12/2021   No Known Allergies      Medication List     STOP taking these medications     ciprofloxacin 250 MG tablet Commonly known as: Cipro   clobetasol ointment 0.05 % Commonly known as: TEMOVATE   HYDROcodone-acetaminophen 5-325 MG tablet Commonly known as: NORCO/VICODIN   meclizine 25 MG tablet Commonly known as: ANTIVERT   pravastatin 40 MG tablet Commonly known as: PRAVACHOL   QUEtiapine 25 MG tablet Commonly known as: SEROQUEL       TAKE these medications    amLODipine 5 MG tablet Commonly known as: NORVASC Take 1 tablet (5 mg total) by mouth daily. Start taking on: December 13, 2021   aspirin EC 81 MG tablet Take 1 tablet (81 mg total) by mouth daily. Swallow whole.   carvedilol 6.25 MG tablet Commonly known as: COREG Take 1 tablet (6.25 mg total) by mouth 2 (two) times daily.   cholecalciferol 1000 units tablet Commonly known as: VITAMIN D Take 1,000 Units by mouth daily.   cyanocobalamin 1000 MCG tablet Commonly known as: VITAMIN B12 Take 1,000 mcg by mouth daily.   folic acid 1 MG tablet Commonly known as: FOLVITE Take 1 tablet (1 mg total) by mouth daily. Start taking on: December 13, 2021   tamsulosin 0.4 MG Caps capsule Commonly known as: FLOMAX Take 0.4 mg by mouth daily.   Travatan Z 0.004 % Soln ophthalmic solution Generic drug: Travoprost (BAK Free) Place 1 drop into both eyes 2 (two) times daily.               Discharge Care Instructions  (From admission, onward)           Start     Ordered   12/12/21 0000  Discharge wound care:       Comments: Clean posterior scrotal wound with normal saline and pat dry. Apply xeroform gauze to open area. Cover with 2x2 silicone foam. Change daily. Keep scrotum elevated as able.   12/12/21 1203            No Known Allergies  Consultations: Urology  Procedures:   Discharge Exam: BP 135/69 (BP Location: Right Arm)   Pulse (!) 54   Temp 97.7 F (36.5 C) (Oral)   Resp 16   Ht '5\' 6"'$  (1.676 m)   Wt 108.9 kg   SpO2 100%   BMI 38.74 kg/m  Physical  Exam Constitutional:      General: He is not in acute distress.    Appearance: Normal appearance.  HENT:     Head: Normocephalic.     Mouth/Throat:     Mouth: Mucous membranes are moist.  Eyes:     Extraocular Movements: Extraocular movements intact.  Cardiovascular:     Rate and Rhythm: Normal rate and regular rhythm.  Pulmonary:     Effort: Pulmonary effort is normal.     Breath sounds: Normal breath sounds.  Abdominal:     General: Bowel sounds are normal. There is no distension.     Palpations: Abdomen is soft.     Tenderness: There is no abdominal tenderness.  Genitourinary:    Comments: Grossly edematous scrotum appreciated showing some improvement now with partial visualization of penis.  Foley catheter emerging from edematous scrotum.  No tenderness to palpation.  Musculoskeletal:        General: Normal range of motion.     Cervical back: Normal range of motion and neck supple.  Skin:    General: Skin is warm and dry.  Neurological:     Mental Status: He is alert. He is disoriented.     Comments: Follows commands; moves all 4 extremities. Only oriented to name  Psychiatric:        Mood and Affect: Mood normal.      The results of significant diagnostics from this hospitalization (including imaging, microbiology, ancillary and laboratory) are listed below for reference.   Microbiology: No results found for this or any previous visit (from the past 240 hour(s)).   Labs: BNP (last 3 results) No results for input(s): "BNP" in the last 8760 hours. Basic Metabolic Panel: Recent Labs  Lab 12/07/21 0522 12/08/21 0418 12/09/21 1034 12/10/21 0511 12/11/21 1205  NA 143 142 141 139 139  K 4.1 4.0 3.7 3.5 3.3*  CL 116* 114* 111 109 107  CO2 21* 22 21* 22 25  GLUCOSE 129* 105* 99 85 153*  BUN 23 21 25* 22 25*  CREATININE 1.56* 1.54* 1.35* 1.40* 1.39*  CALCIUM 8.5* 8.8* 8.7* 9.0 8.6*  MG 2.2 2.1 2.2 1.9 2.1  PHOS 2.9 2.9 3.2 3.3 3.4   Liver Function Tests: Recent  Labs  Lab 12/07/21 0522 12/08/21 0418 12/09/21 1034 12/10/21 0511 12/11/21 1205  AST 50* 58* 53* 48* 38  ALT 40 42 38 39 30  ALKPHOS 75 80 78 75 71  BILITOT 0.7 0.7 0.8 0.8 0.5  PROT 6.4* 6.6 6.5 6.5 6.6  ALBUMIN 2.7* 2.7* 2.7* 2.6* 2.6*   No results for input(s): "LIPASE", "AMYLASE" in the last 168 hours. No results for input(s): "AMMONIA" in the last 168 hours. CBC: Recent Labs  Lab 12/07/21 0522 12/08/21 0418 12/09/21 1034 12/10/21 0511 12/11/21 1205  WBC 4.6 4.6 4.6 4.8 4.1  NEUTROABS 3.2 2.9 3.2 3.4 2.8  HGB 9.0* 9.9* 8.8* 9.4* 8.9*  HCT 29.4* 31.2* 27.4* 29.4* 26.9*  MCV 111.4* 109.1* 106.2* 105.4* 105.5*  PLT 154 168 179 179 174   Cardiac Enzymes: No results for input(s): "CKTOTAL", "CKMB", "CKMBINDEX", "TROPONINI" in the last 168 hours. BNP: Invalid input(s): "POCBNP" CBG: Recent Labs  Lab 12/08/21 2137 12/09/21 0749  GLUCAP 94 82   D-Dimer No results for input(s): "DDIMER" in the last 72 hours. Hgb A1c No results for input(s): "HGBA1C" in the last 72 hours. Lipid Profile No results for input(s): "CHOL", "HDL", "LDLCALC", "TRIG", "CHOLHDL", "LDLDIRECT" in the last 72 hours. Thyroid function studies No results for input(s): "TSH", "T4TOTAL", "T3FREE", "THYROIDAB" in the last 72 hours.  Invalid input(s): "FREET3" Anemia work up No results for input(s): "VITAMINB12", "FOLATE", "FERRITIN", "TIBC", "IRON", "RETICCTPCT" in the last 72 hours. Urinalysis    Component Value Date/Time   COLORURINE YELLOW 11/30/2021 2159   APPEARANCEUR HAZY (A) 11/30/2021 2159   APPEARANCEUR Clear 07/16/2021 1132   LABSPEC 1.017 11/30/2021 2159   PHURINE 5.0 11/30/2021 2159   GLUCOSEU NEGATIVE 11/30/2021 2159   HGBUR MODERATE (A) 11/30/2021 2159   BILIRUBINUR NEGATIVE 11/30/2021 2159   BILIRUBINUR Negative 07/16/2021 1132   KETONESUR 5 (A) 11/30/2021 2159   PROTEINUR 30 (A) 11/30/2021 2159   NITRITE NEGATIVE 11/30/2021 2159   LEUKOCYTESUR NEGATIVE 11/30/2021 2159    Sepsis Labs Recent Labs  Lab 12/08/21 0418 12/09/21 1034 12/10/21 0511 12/11/21 1205  WBC 4.6 4.6 4.8 4.1   Microbiology No results found for this or any previous visit (from the past 240 hour(s)).  Procedures/Studies: DG Chest Port 1 View  Result Date: 11/30/2021 CLINICAL DATA:  Altered mental status EXAM: PORTABLE CHEST 1 VIEW COMPARISON:  Chest x-ray dated July 31, 2021 FINDINGS: Cardiac and mediastinal contours are unchanged when accounting for patient rightward rotation. Mild bilateral heterogeneous opacities. More focal opacity of the right lung base no large pleural effusion. No evidence of pneumothorax. IMPRESSION: 1. Mild bilateral heterogeneous opacities, likely due to pulmonary edema. 2. More focal opacity of the at the right lung base is likely due to atelectasis. Electronically Signed   By: Yetta Glassman M.D.   On: 11/30/2021 16:15   CT Head Wo Contrast  Result Date: 11/30/2021 CLINICAL DATA:  Neuro deficit, acute, stroke suspected EXAM: CT HEAD WITHOUT CONTRAST TECHNIQUE: Contiguous axial images were obtained from the base of the skull through the vertex without intravenous contrast. RADIATION DOSE REDUCTION: This exam was performed according to the departmental dose-optimization program which includes automated exposure control, adjustment of the mA and/or kV according to patient size and/or use of iterative reconstruction technique. COMPARISON:  07/27/2021 FINDINGS: Brain: There is periventricular white matter decreased attenuation consistent with small vessel ischemic changes. Ventricles, sulci and cisterns are prominent consistent with age related involutional changes. No acute intracranial hemorrhage, mass effect or shift. No hydrocephalus. Vascular: No hyperdense vessel or unexpected calcification. Skull: Normal. Negative for fracture or focal lesion. Sinuses/Orbits: mucoperiosteal thickening consistent With chronic bilateral maxillary sinusitis. IMPRESSION: Atrophy  and chronic small vessel ischemic changes. No acute intracranial process identified. Electronically Signed   By: Sammie Bench M.D.   On: 11/30/2021 16:13     Time coordinating discharge: Over 30 minutes    Terrance Dee, MD  [facility] 12/12/2021, 12:05 PM

## 2021-12-12 NOTE — TOC Progression Note (Addendum)
Transition of Care [facility]) - Progression Note    Patient Details  Name: Terrance Miller MRN: [id number] Date of Birth: [birth year]  [facility] CM/SW Contact  Leeroy Cha, RN Phone Number: 12/12/2021, 7:36 AM  Clinical Narrative:    Kerry Dory at [place]. Message left for possible.admission. Tcf-daughter [name]-informed that patient has been approved to to go [place] and I am awaiting a return call from [place] . Tcf-debbie patient will go to section a6 and bed 1. Note to fllor rn. Expected Discharge Plan: [place] Barriers to Discharge: Continued Medical Work up  Expected Discharge Plan and Services Expected Discharge Plan: [place]   Discharge Planning Services: CM Consult Post Acute Care Choice: [place] Living arrangements for the past 2 months: Single Family Home                                       Social Determinants of Health (SDOH) Interventions    Readmission Risk Interventions   No data to display

## 2021-12-12 NOTE — Progress Notes (Signed)
Physical Therapy Treatment Patient Details Name: Terrance Miller MRN: [id number] DOB: February 18, 1928 Today's Date: 12/12/2021   History of Present Illness Patient is a 88 year old male who presented with altered mentation and difficulty with urination, Urology was consulted for Foley placement due to severe edema. recent hydrocele s/p hydrocelectomy 11/15. PMH CAD, DM II, HLD, HTN    PT Comments    Pt in recliner on arrival to room.  Pt assisted with ambulating short distance in hallway however fatigued quickly today reporting UE weakness and fatigue.  Current d/c plan is for SNF, possibly today.    Recommendations for follow up therapy are one component of a multi-disciplinary discharge planning process, led by the attending physician.  Recommendations may be updated based on patient status, additional functional criteria and insurance authorization.  Follow Up Recommendations  Skilled nursing-short term rehab (<3 hours/day) Can patient physically be transported by private vehicle: No   Assistance Recommended at Discharge Frequent or constant Supervision/Assistance  Patient can return home with the following A lot of help with bathing/dressing/bathroom;Assist for transportation;Assistance with cooking/housework;Help with stairs or ramp for entrance;A little help with walking and/or transfers   Equipment Recommendations  None recommended by PT    Recommendations for Other Services       Precautions / Restrictions Precautions Precautions: Fall Precaution Comments: severe scrotal edema/hydrocele, visual deficits?     Mobility  Bed Mobility               General bed mobility comments: pt in recliner    Transfers Overall transfer level: Needs assistance Equipment used: Rolling walker (2 wheels) Transfers: Sit to/from Stand Sit to Stand: Min assist           General transfer comment: cues for hand placement, assist with controlling descent     Ambulation/Gait Ambulation/Gait assistance: Min assist Gait Distance (Feet): 17 Feet Assistive device: Rolling walker (2 wheels) Gait Pattern/deviations: Step-through pattern, Decreased stride length Gait velocity: decr     General Gait Details: verbal cues for RW positioning and encouraged distance as tolerated, distance limited today by UE weakness and fatigue per pt; recliner brought to pt   Stairs             Wheelchair Mobility    Modified Rankin (Stroke Patients Only)       Balance Overall balance assessment: Needs assistance         Standing balance support: Reliant on assistive device for balance, Bilateral upper extremity supported, During functional activity Standing balance-Leahy Scale: Poor                              Cognition Arousal/Alertness: Awake/alert Behavior During Therapy: WFL for tasks assessed/performed Overall Cognitive Status: Within Functional Limits for tasks assessed                                 General Comments: HOH but able to follow commands        Exercises      General Comments        Pertinent Vitals/Pain Pain Assessment Pain Assessment: Faces Faces Pain Scale: Hurts little more Pain Location: scrotum Pain Descriptors / Indicators: Grimacing, Guarding Pain Intervention(s): Repositioned, Monitored during session    Home Living  Prior Function            PT Goals (current goals can now be found in the care plan section) Progress towards PT goals: Progressing toward goals    Frequency    Min 2X/week      PT Plan Current plan remains appropriate    Co-evaluation              AM-PAC PT "6 Clicks" Mobility   Outcome Measure  Help needed turning from your back to your side while in a flat bed without using bedrails?: A Lot Help needed moving from lying on your back to sitting on the side of a flat bed without using bedrails?: A  Lot Help needed moving to and from a bed to a chair (including a wheelchair)?: A Little Help needed standing up from a chair using your arms (e.g., wheelchair or bedside chair)?: A Little Help needed to walk in hospital room?: A Little Help needed climbing 3-5 steps with a railing? : A Lot 6 Click Score: 15    End of Session Equipment Utilized During Treatment: Gait belt Activity Tolerance: Patient tolerated treatment well Patient left: in chair;with call bell/phone within reach;with nursing/sitter in room Nurse Communication: Mobility status PT Visit Diagnosis: Muscle weakness (generalized) (M62.81);Difficulty in walking, not elsewhere classified (R26.2)     Time: 0569-7948 PT Time Calculation (min) (ACUTE ONLY): 12 min  Charges:  $Gait Training: 8-22 mins                     Arlyce Dice, DPT Physical Therapist Acute Rehabilitation Services Preferred contact method: Secure Chat Weekend Pager Only: [phone] Office: [place] 12/12/2021, 3:33 PM

## 2021-12-12 NOTE — Progress Notes (Signed)
Called [facility] facility and report given to [facility].

## 2021-12-13 DIAGNOSIS — I10 Essential (primary) hypertension: Secondary | ICD-10-CM | POA: Diagnosis not present

## 2021-12-13 DIAGNOSIS — E785 Hyperlipidemia, unspecified: Secondary | ICD-10-CM | POA: Diagnosis not present

## 2021-12-13 DIAGNOSIS — M6281 Muscle weakness (generalized): Secondary | ICD-10-CM | POA: Diagnosis not present

## 2021-12-13 DIAGNOSIS — N1832 Chronic kidney disease, stage 3b: Secondary | ICD-10-CM | POA: Diagnosis not present

## 2021-12-13 DIAGNOSIS — G9341 Metabolic encephalopathy: Secondary | ICD-10-CM | POA: Diagnosis not present

## 2021-12-13 DIAGNOSIS — R339 Retention of urine, unspecified: Secondary | ICD-10-CM | POA: Diagnosis not present

## 2021-12-13 DIAGNOSIS — E871 Hypo-osmolality and hyponatremia: Secondary | ICD-10-CM | POA: Diagnosis not present

## 2021-12-16 DIAGNOSIS — M6281 Muscle weakness (generalized): Secondary | ICD-10-CM | POA: Diagnosis not present

## 2021-12-16 DIAGNOSIS — N1832 Chronic kidney disease, stage 3b: Secondary | ICD-10-CM | POA: Diagnosis not present

## 2021-12-16 DIAGNOSIS — G9341 Metabolic encephalopathy: Secondary | ICD-10-CM | POA: Diagnosis not present

## 2021-12-16 DIAGNOSIS — E785 Hyperlipidemia, unspecified: Secondary | ICD-10-CM | POA: Diagnosis not present

## 2021-12-16 DIAGNOSIS — E871 Hypo-osmolality and hyponatremia: Secondary | ICD-10-CM | POA: Diagnosis not present

## 2021-12-16 DIAGNOSIS — I10 Essential (primary) hypertension: Secondary | ICD-10-CM | POA: Diagnosis not present

## 2021-12-16 DIAGNOSIS — R339 Retention of urine, unspecified: Secondary | ICD-10-CM | POA: Diagnosis not present

## 2021-12-17 ENCOUNTER — Encounter: Payer: Self-pay | Admitting: *Deleted

## 2021-12-17 ENCOUNTER — Inpatient Hospital Stay: Payer: Medicare Other | Admitting: Physician Assistant

## 2021-12-17 DIAGNOSIS — N1832 Chronic kidney disease, stage 3b: Secondary | ICD-10-CM | POA: Diagnosis not present

## 2021-12-17 DIAGNOSIS — D472 Monoclonal gammopathy: Secondary | ICD-10-CM | POA: Diagnosis not present

## 2021-12-17 DIAGNOSIS — I10 Essential (primary) hypertension: Secondary | ICD-10-CM | POA: Diagnosis not present

## 2021-12-17 DIAGNOSIS — R339 Retention of urine, unspecified: Secondary | ICD-10-CM | POA: Diagnosis not present

## 2021-12-17 DIAGNOSIS — G9341 Metabolic encephalopathy: Secondary | ICD-10-CM | POA: Diagnosis not present

## 2021-12-17 DIAGNOSIS — E785 Hyperlipidemia, unspecified: Secondary | ICD-10-CM | POA: Diagnosis not present

## 2021-12-17 DIAGNOSIS — E870 Hyperosmolality and hypernatremia: Secondary | ICD-10-CM | POA: Diagnosis not present

## 2021-12-17 NOTE — Progress Notes (Signed)
Terrance Miller was contacted by telephone to advise of follow up appointments for labs and office visit in January.  Verbalized understanding.  Transportation to appointments were confirmed for the patient as being provided by [facility].

## 2021-12-18 ENCOUNTER — Telehealth: Payer: Self-pay

## 2021-12-18 ENCOUNTER — Other Ambulatory Visit: Payer: Medicare Other

## 2021-12-18 ENCOUNTER — Ambulatory Visit: Payer: Medicare Other | Admitting: Urology

## 2021-12-18 DIAGNOSIS — M6281 Muscle weakness (generalized): Secondary | ICD-10-CM | POA: Diagnosis not present

## 2021-12-18 DIAGNOSIS — L8993 Pressure ulcer of unspecified site, stage 3: Secondary | ICD-10-CM | POA: Diagnosis not present

## 2021-12-18 DIAGNOSIS — R239 Unspecified skin changes: Secondary | ICD-10-CM | POA: Diagnosis not present

## 2021-12-18 NOTE — Telephone Encounter (Signed)
Patient's daughter will drop off urine specimen late this afternoon. She's unsure if patient still has UTI. Daughter wanting to check with specimen drop off.  Wants to know why patient continues to have reoccurring UTI's.  Please advise when calling with UA results.  Call back:  [place]  Thanks, Helene Kelp

## 2021-12-19 NOTE — Telephone Encounter (Signed)
Per front staff, daughter will have in house doctor at facility treat patient until he follows up with Dr. Diona Fanti next week

## 2021-12-20 DIAGNOSIS — N1832 Chronic kidney disease, stage 3b: Secondary | ICD-10-CM | POA: Diagnosis not present

## 2021-12-20 DIAGNOSIS — E785 Hyperlipidemia, unspecified: Secondary | ICD-10-CM | POA: Diagnosis not present

## 2021-12-20 DIAGNOSIS — R339 Retention of urine, unspecified: Secondary | ICD-10-CM | POA: Diagnosis not present

## 2021-12-20 DIAGNOSIS — G9341 Metabolic encephalopathy: Secondary | ICD-10-CM | POA: Diagnosis not present

## 2021-12-20 DIAGNOSIS — I10 Essential (primary) hypertension: Secondary | ICD-10-CM | POA: Diagnosis not present

## 2021-12-20 DIAGNOSIS — E871 Hypo-osmolality and hyponatremia: Secondary | ICD-10-CM | POA: Diagnosis not present

## 2021-12-20 DIAGNOSIS — M6281 Muscle weakness (generalized): Secondary | ICD-10-CM | POA: Diagnosis not present

## 2021-12-24 ENCOUNTER — Ambulatory Visit: Payer: Medicare Other | Admitting: Urology

## 2021-12-24 DIAGNOSIS — M6281 Muscle weakness (generalized): Secondary | ICD-10-CM | POA: Diagnosis not present

## 2021-12-24 DIAGNOSIS — Z9181 History of falling: Secondary | ICD-10-CM | POA: Diagnosis not present

## 2021-12-24 DIAGNOSIS — R262 Difficulty in walking, not elsewhere classified: Secondary | ICD-10-CM | POA: Diagnosis not present

## 2021-12-25 DIAGNOSIS — U071 COVID-19: Secondary | ICD-10-CM | POA: Diagnosis not present

## 2021-12-27 DIAGNOSIS — R239 Unspecified skin changes: Secondary | ICD-10-CM | POA: Diagnosis not present

## 2021-12-27 DIAGNOSIS — U071 COVID-19: Secondary | ICD-10-CM | POA: Diagnosis not present

## 2021-12-27 DIAGNOSIS — M6281 Muscle weakness (generalized): Secondary | ICD-10-CM | POA: Diagnosis not present

## 2021-12-27 DIAGNOSIS — L8993 Pressure ulcer of unspecified site, stage 3: Secondary | ICD-10-CM | POA: Diagnosis not present

## 2021-12-31 DIAGNOSIS — R0989 Other specified symptoms and signs involving the circulatory and respiratory systems: Secondary | ICD-10-CM | POA: Diagnosis not present

## 2021-12-31 DIAGNOSIS — I517 Cardiomegaly: Secondary | ICD-10-CM | POA: Diagnosis not present

## 2022-01-01 DIAGNOSIS — J069 Acute upper respiratory infection, unspecified: Secondary | ICD-10-CM | POA: Diagnosis not present

## 2022-01-01 DIAGNOSIS — R509 Fever, unspecified: Secondary | ICD-10-CM | POA: Diagnosis not present

## 2022-01-01 DIAGNOSIS — R609 Edema, unspecified: Secondary | ICD-10-CM | POA: Diagnosis not present

## 2022-01-03 DIAGNOSIS — J069 Acute upper respiratory infection, unspecified: Secondary | ICD-10-CM | POA: Diagnosis not present

## 2022-01-03 DIAGNOSIS — R609 Edema, unspecified: Secondary | ICD-10-CM | POA: Diagnosis not present

## 2022-01-07 ENCOUNTER — Inpatient Hospital Stay: Attending: Hematology

## 2022-01-07 DIAGNOSIS — D539 Nutritional anemia, unspecified: Secondary | ICD-10-CM

## 2022-01-07 DIAGNOSIS — M6250 Muscle wasting and atrophy, not elsewhere classified, unspecified site: Secondary | ICD-10-CM | POA: Insufficient documentation

## 2022-01-07 DIAGNOSIS — H538 Other visual disturbances: Secondary | ICD-10-CM | POA: Insufficient documentation

## 2022-01-07 DIAGNOSIS — G629 Polyneuropathy, unspecified: Secondary | ICD-10-CM | POA: Insufficient documentation

## 2022-01-07 DIAGNOSIS — R262 Difficulty in walking, not elsewhere classified: Secondary | ICD-10-CM | POA: Insufficient documentation

## 2022-01-07 DIAGNOSIS — N1832 Chronic kidney disease, stage 3b: Secondary | ICD-10-CM | POA: Diagnosis not present

## 2022-01-07 DIAGNOSIS — D472 Monoclonal gammopathy: Secondary | ICD-10-CM | POA: Diagnosis not present

## 2022-01-07 DIAGNOSIS — D7589 Other specified diseases of blood and blood-forming organs: Secondary | ICD-10-CM | POA: Diagnosis not present

## 2022-01-07 DIAGNOSIS — Z87891 Personal history of nicotine dependence: Secondary | ICD-10-CM | POA: Insufficient documentation

## 2022-01-07 DIAGNOSIS — R4189 Other symptoms and signs involving cognitive functions and awareness: Secondary | ICD-10-CM | POA: Diagnosis not present

## 2022-01-07 DIAGNOSIS — D649 Anemia, unspecified: Secondary | ICD-10-CM | POA: Diagnosis not present

## 2022-01-07 LAB — LACTATE DEHYDROGENASE: LDH: 175 U/L (ref 98–192)

## 2022-01-07 LAB — CBC WITH DIFFERENTIAL/PLATELET
Abs Immature Granulocytes: 0.05 10*3/uL (ref 0.00–0.07)
Basophils Absolute: 0 10*3/uL (ref 0.0–0.1)
Basophils Relative: 0 %
Eosinophils Absolute: 0.1 10*3/uL (ref 0.0–0.5)
Eosinophils Relative: 1 %
HCT: 34.1 % — ABNORMAL LOW (ref 39.0–52.0)
Hemoglobin: 10.9 g/dL — ABNORMAL LOW (ref 13.0–17.0)
Immature Granulocytes: 1 %
Lymphocytes Relative: 15 %
Lymphs Abs: 1.3 10*3/uL (ref 0.7–4.0)
MCH: 32.9 pg (ref 26.0–34.0)
MCHC: 32 g/dL (ref 30.0–36.0)
MCV: 103 fL — ABNORMAL HIGH (ref 80.0–100.0)
Monocytes Absolute: 1 10*3/uL (ref 0.1–1.0)
Monocytes Relative: 12 %
Neutro Abs: 5.9 10*3/uL (ref 1.7–7.7)
Neutrophils Relative %: 71 %
Platelets: 273 10*3/uL (ref 150–400)
RBC: 3.31 MIL/uL — ABNORMAL LOW (ref 4.22–5.81)
RDW: 19.1 % — ABNORMAL HIGH (ref 11.5–15.5)
WBC: 8.4 10*3/uL (ref 4.0–10.5)
nRBC: 1.6 % — ABNORMAL HIGH (ref 0.0–0.2)

## 2022-01-07 LAB — COMPREHENSIVE METABOLIC PANEL
ALT: 15 U/L (ref 0–44)
AST: 27 U/L (ref 15–41)
Albumin: 3.1 g/dL — ABNORMAL LOW (ref 3.5–5.0)
Alkaline Phosphatase: 102 U/L (ref 38–126)
Anion gap: 8 (ref 5–15)
BUN: 20 mg/dL (ref 8–23)
CO2: 24 mmol/L (ref 22–32)
Calcium: 8.9 mg/dL (ref 8.9–10.3)
Chloride: 107 mmol/L (ref 98–111)
Creatinine, Ser: 1.34 mg/dL — ABNORMAL HIGH (ref 0.61–1.24)
GFR, Estimated: 50 mL/min — ABNORMAL LOW (ref >60)
Glucose, Bld: 131 mg/dL — ABNORMAL HIGH (ref 70–99)
Potassium: 3.4 mmol/L — ABNORMAL LOW (ref 3.5–5.1)
Sodium: 139 mmol/L (ref 135–145)
Total Bilirubin: 1.6 mg/dL — ABNORMAL HIGH (ref 0.3–1.2)
Total Protein: 8.2 g/dL — ABNORMAL HIGH (ref 6.5–8.1)

## 2022-01-08 DIAGNOSIS — G9341 Metabolic encephalopathy: Secondary | ICD-10-CM | POA: Diagnosis not present

## 2022-01-08 DIAGNOSIS — H409 Unspecified glaucoma: Secondary | ICD-10-CM | POA: Diagnosis not present

## 2022-01-08 DIAGNOSIS — M6281 Muscle weakness (generalized): Secondary | ICD-10-CM | POA: Diagnosis not present

## 2022-01-08 DIAGNOSIS — L8993 Pressure ulcer of unspecified site, stage 3: Secondary | ICD-10-CM | POA: Diagnosis not present

## 2022-01-08 DIAGNOSIS — E11628 Type 2 diabetes mellitus with other skin complications: Secondary | ICD-10-CM | POA: Diagnosis not present

## 2022-01-08 DIAGNOSIS — I10 Essential (primary) hypertension: Secondary | ICD-10-CM | POA: Diagnosis not present

## 2022-01-08 DIAGNOSIS — R239 Unspecified skin changes: Secondary | ICD-10-CM | POA: Diagnosis not present

## 2022-01-08 LAB — KAPPA/LAMBDA LIGHT CHAINS
Kappa free light chain: 53 mg/L — ABNORMAL HIGH (ref 3.3–19.4)
Kappa, lambda light chain ratio: 1.18 (ref 0.26–1.65)
Lambda free light chains: 45.1 mg/L — ABNORMAL HIGH (ref 5.7–26.3)

## 2022-01-08 LAB — HOMOCYSTEINE: Homocysteine: 14.4 umol/L (ref 0.0–21.3)

## 2022-01-09 LAB — PROTEIN ELECTROPHORESIS, SERUM
A/G Ratio: 0.7 (ref 0.7–1.7)
Albumin ELP: 3.1 g/dL (ref 2.9–4.4)
Alpha-1-Globulin: 0.3 g/dL (ref 0.0–0.4)
Alpha-2-Globulin: 0.8 g/dL (ref 0.4–1.0)
Beta Globulin: 1.1 g/dL (ref 0.7–1.3)
Gamma Globulin: 2.2 g/dL — ABNORMAL HIGH (ref 0.4–1.8)
Globulin, Total: 4.4 g/dL — ABNORMAL HIGH (ref 2.2–3.9)
M-Spike, %: 1.6 g/dL — ABNORMAL HIGH
Total Protein ELP: 7.5 g/dL (ref 6.0–8.5)

## 2022-01-09 LAB — METHYLMALONIC ACID, SERUM: Methylmalonic Acid, Quantitative: 142 nmol/L (ref 0–378)

## 2022-01-13 ENCOUNTER — Ambulatory Visit: Payer: Medicare Other | Admitting: Family Medicine

## 2022-01-14 ENCOUNTER — Ambulatory Visit: Payer: Medicare Other | Admitting: Physician Assistant

## 2022-01-17 NOTE — Progress Notes (Unsigned)
VIRTUAL VISIT via [place]   I connected with Terrance Miller  on 01/20/2022 at 4:05 PM by telephone and verified that I am speaking with the correct person using two identifiers.  Location: Patient: Home Provider: [place]  OTHER PERSONS INVOLVED: Jackelyn Hoehn (patient's niece, present during phone visit to assist with history per patient permission)   I discussed the limitations, risks, security and privacy concerns of performing an evaluation and management service by telephone and the availability of in person appointments. I also discussed with the patient that there may be a patient responsible charge related to this service. The patient expressed understanding and agreed to proceed.  REASON FOR VISIT:  Follow-up for macrocytic anemia & IgG kappa MGUS  PRIOR THERAPY: None  CURRENT THERAPY: Under work-up  INTERVAL HISTORY:   Terrance Miller 89 y.o. male returns for routine follow-up of  his normocytic anemia and recently diagnosed MGUS.  He was last seen by Tarri Abernethy PA-C on 06/19/2021.  He missed his follow-up appointment, and was lost to follow-up for several months, but referred back to [place] by his PCP for worsening anemia.  At today's visit, he reports feeling fair.  Since his last visit, he had urology surgery (bilateral hydrocele repair) and has had 2 hospitalizations (UTI) and rehab stay, and has only been home from rehab/SNF for the past week.  He continues to have difficulty walking with significant muscle wasting and atrophy and is also reported to have some "mild cognitive deficits since his hospitalizations and SNF stay.  He continues to take his vitamin B12 supplement daily.  He denies any source of blood loss such as epistaxis, hematemesis, hematochezia, or melena.  He has had worsening fatigue following his recent hospitalizations.  He lost about 10 pounds during hospitalization and rehab stay.  No unexplained fever, chills,  night sweats. He has not noticed any new bone pain.  Reports intermittent neuropathy of his legs, occasional tinnitus, and chronic/progressive blurry vision.  He reports 25% energy and 50% appetite.  REVIEW OF SYSTEMS:   Review of Systems  Constitutional:  Positive for malaise/fatigue. Negative for chills, diaphoresis, fever and weight loss.  Respiratory:  Negative for cough and shortness of breath.   Cardiovascular:  Negative for chest pain and palpitations.  Gastrointestinal:  Negative for abdominal pain, blood in stool, melena, nausea and vomiting.  Neurological:  Negative for dizziness and headaches.     PHYSICAL EXAM: (per limitations of virtual telephone visit)  The patient is alert and oriented x 3, exhibiting adequate mentation, good mood, and ability to speak in full sentences and execute sound judgement.  ASSESSMENT & PLAN:  1.  Macrocytic anemia - Patient seen at the request of Dr. Lacinda Axon for further work-up of macrocytic anemia. - His hemoglobin was ranging between 10-11.5 since 2019. - He also has CKD stage IIIb with creatinine around 1.8-2.0. - No prior history of transfusions.  He never had a colonoscopy. - CT abdomen/pelvis without contrast (07/27/2021) showed spleen within normal limits and unenhanced liver grossly unremarkable - He had mild thrombocytopenia which has resolved after started on vitamin B12 repletion - Hematology work-up (05/31/2021): Flow cytometry nondiagnostic for lymphoproliferative process SPEP revealed monoclonal protein, M spike 0.8, addressed below Normal copper and folate.  Elevated ferritin 392, iron saturation 47%. Normal B12, but mildly elevated methylmalonic acid 400. Reticulocytes 2.1%, normal LDH 125. - Most recent CBC (01/07/2022): Hgb 10.9/MCV 103.0, normal WBC, normal platelets, nRBC 1.6 - 01/07/2022: Methylmalonic  acid normalized.  Homocystine WNL. - Denies any bleeding per rectum or melena. - He denies any B symptoms or new bone pain.  He  does have intermittent fatigue. - Differential diagnosis includes anemia of CKD versus bone marrow infiltrative process (including MDS or myeloma).  Unclear etiology of macrocytosis at this time, possibly related to mild B12 deficiency versus occult liver disease versus MDS or other infiltrative process. - PLAN: Continue vitamin B12 500 mcg daily due to elevated methylmalonic acid. - Discussed bone marrow biopsy based on elevated nRBC and M spike trending upward - patient/family request some "time to think about this."  Per patient's niece, she is not sure that "they want to put him through that and may just let him live out the rest of his days in peace." - We will discuss further with patient/family in 1 to 2 weeks regarding whether or not they would like to proceed with bone marrow biopsy.    2.  IgG kappa MGUS - Discovered during work-up for normocytic anemia as above SPEP (05/31/2021) with M spike 0.8. Immunofixation (06/19/2021) shows IgG kappa monoclonal protein.   Elevated kappa light chains 49.2, mildly elevated lambda 26.5 with mildly elevated free light chain ratio 1.86.   Beta-2 microglobulin mildly elevated at 2.8.  LDH normal 145. 24-hour urine with 429 mg/24-hr total protein.  Urine immunofixation shows IgG kappa, with urine M spike 3.4% with M-spike 15 mg/24 hours - Skeletal survey (06/19/2021): No radiographic evidence of lytic or blastic osseous lesion - Most recent MGUS/myeloma panel (01/07/2022) SPEP shows M spike 1.6%, which is doubled in the last 6 months Elevated kappa light chains 53.0, elevated lambda 45.1, normal ratio 1.18 Normal LDH Normal calcium 8.9, creatinine 1.34/GFR 50 at baseline in setting of longstanding history of CKD stage IIIb.  Hgb 10.9. - He denies any B symptoms or new bone pain.  He does have intermittent fatigue, neuropathy of his legs, tinnitus, and blurry vision. - PLAN: Discussed bone marrow biopsy based on elevated nRBC and M-spike trending upward -  patient/family requests" time to think about this" as noted above  3.  Social/family history: - Lives at home with his daughter.  He walks with the help of cane and walker.  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  Quit smoking 20 years ago. - 5 of his maternal aunts died of cancer.  Patient does not know the types.  PLAN SUMMARY: >> Plan TBD pending follow-up conversation with patient/family in 1 to 2 weeks regarding their decision about bone marrow biopsy.     I discussed the assessment and treatment plan with the patient. The patient was provided an opportunity to ask questions and all were answered. The patient agreed with the plan and demonstrated an understanding of the instructions.   The patient was advised to call back or seek an in-person evaluation if the symptoms worsen or if the condition fails to improve as anticipated.  I provided 22 minutes of non-face-to-face time during this encounter.  Harriett Rush, PA-C 01/20/2022 10:27 PM

## 2022-01-20 ENCOUNTER — Inpatient Hospital Stay (HOSPITAL_BASED_OUTPATIENT_CLINIC_OR_DEPARTMENT_OTHER): Admitting: Physician Assistant

## 2022-01-20 DIAGNOSIS — D539 Nutritional anemia, unspecified: Secondary | ICD-10-CM | POA: Diagnosis not present

## 2022-01-20 DIAGNOSIS — D472 Monoclonal gammopathy: Secondary | ICD-10-CM

## 2022-01-21 ENCOUNTER — Ambulatory Visit: Payer: Medicare Other | Admitting: Urology

## 2022-01-23 DIAGNOSIS — R269 Unspecified abnormalities of gait and mobility: Secondary | ICD-10-CM | POA: Diagnosis not present

## 2022-01-23 DIAGNOSIS — M6281 Muscle weakness (generalized): Secondary | ICD-10-CM | POA: Diagnosis not present

## 2022-01-24 DIAGNOSIS — R262 Difficulty in walking, not elsewhere classified: Secondary | ICD-10-CM | POA: Diagnosis not present

## 2022-01-24 DIAGNOSIS — Z9181 History of falling: Secondary | ICD-10-CM | POA: Diagnosis not present

## 2022-01-24 DIAGNOSIS — M6281 Muscle weakness (generalized): Secondary | ICD-10-CM | POA: Diagnosis not present

## 2022-01-27 ENCOUNTER — Emergency Department (HOSPITAL_COMMUNITY): Payer: Medicare Other

## 2022-01-27 ENCOUNTER — Other Ambulatory Visit: Payer: Self-pay

## 2022-01-27 ENCOUNTER — Encounter (HOSPITAL_COMMUNITY): Payer: Self-pay

## 2022-01-27 ENCOUNTER — Telehealth: Payer: Self-pay

## 2022-01-27 ENCOUNTER — Inpatient Hospital Stay (HOSPITAL_COMMUNITY)
Admission: EM | Admit: 2022-01-27 | Discharge: 2022-01-31 | DRG: 699 | Disposition: A | Discharge status: Home Health | Payer: Medicare Other | Attending: Family Medicine | Admitting: Family Medicine

## 2022-01-27 DIAGNOSIS — B9562 Methicillin resistant Staphylococcus aureus infection as the cause of diseases classified elsewhere: Secondary | ICD-10-CM | POA: Diagnosis not present

## 2022-01-27 DIAGNOSIS — N3000 Acute cystitis without hematuria: Secondary | ICD-10-CM | POA: Diagnosis not present

## 2022-01-27 DIAGNOSIS — K626 Ulcer of anus and rectum: Secondary | ICD-10-CM | POA: Diagnosis not present

## 2022-01-27 DIAGNOSIS — Z87891 Personal history of nicotine dependence: Secondary | ICD-10-CM | POA: Diagnosis not present

## 2022-01-27 DIAGNOSIS — D539 Nutritional anemia, unspecified: Secondary | ICD-10-CM | POA: Diagnosis not present

## 2022-01-27 DIAGNOSIS — K5289 Other specified noninfective gastroenteritis and colitis: Secondary | ICD-10-CM | POA: Diagnosis not present

## 2022-01-27 DIAGNOSIS — F0390 Unspecified dementia without behavioral disturbance: Secondary | ICD-10-CM | POA: Diagnosis present

## 2022-01-27 DIAGNOSIS — N1831 Chronic kidney disease, stage 3a: Secondary | ICD-10-CM | POA: Diagnosis not present

## 2022-01-27 DIAGNOSIS — Z8249 Family history of ischemic heart disease and other diseases of the circulatory system: Secondary | ICD-10-CM

## 2022-01-27 DIAGNOSIS — E872 Acidosis, unspecified: Secondary | ICD-10-CM | POA: Diagnosis not present

## 2022-01-27 DIAGNOSIS — S20351A Superficial foreign body of right front wall of thorax, initial encounter: Secondary | ICD-10-CM | POA: Diagnosis not present

## 2022-01-27 DIAGNOSIS — Z79899 Other long term (current) drug therapy: Secondary | ICD-10-CM

## 2022-01-27 DIAGNOSIS — E876 Hypokalemia: Secondary | ICD-10-CM | POA: Diagnosis not present

## 2022-01-27 DIAGNOSIS — T83511A Infection and inflammatory reaction due to indwelling urethral catheter, initial encounter: Secondary | ICD-10-CM | POA: Diagnosis not present

## 2022-01-27 DIAGNOSIS — Y846 Urinary catheterization as the cause of abnormal reaction of the patient, or of later complication, without mention of misadventure at the time of the procedure: Secondary | ICD-10-CM | POA: Diagnosis present

## 2022-01-27 DIAGNOSIS — Z9842 Cataract extraction status, left eye: Secondary | ICD-10-CM

## 2022-01-27 DIAGNOSIS — K648 Other hemorrhoids: Secondary | ICD-10-CM | POA: Diagnosis not present

## 2022-01-27 DIAGNOSIS — K5900 Constipation, unspecified: Secondary | ICD-10-CM | POA: Diagnosis not present

## 2022-01-27 DIAGNOSIS — E1122 Type 2 diabetes mellitus with diabetic chronic kidney disease: Secondary | ICD-10-CM | POA: Diagnosis present

## 2022-01-27 DIAGNOSIS — Z9841 Cataract extraction status, right eye: Secondary | ICD-10-CM

## 2022-01-27 DIAGNOSIS — I129 Hypertensive chronic kidney disease with stage 1 through stage 4 chronic kidney disease, or unspecified chronic kidney disease: Secondary | ICD-10-CM | POA: Diagnosis not present

## 2022-01-27 DIAGNOSIS — N3289 Other specified disorders of bladder: Secondary | ICD-10-CM | POA: Diagnosis not present

## 2022-01-27 DIAGNOSIS — L89892 Pressure ulcer of other site, stage 2: Secondary | ICD-10-CM | POA: Diagnosis present

## 2022-01-27 DIAGNOSIS — Z515 Encounter for palliative care: Secondary | ICD-10-CM | POA: Diagnosis not present

## 2022-01-27 DIAGNOSIS — R6889 Other general symptoms and signs: Secondary | ICD-10-CM | POA: Diagnosis not present

## 2022-01-27 DIAGNOSIS — Y839 Surgical procedure, unspecified as the cause of abnormal reaction of the patient, or of later complication, without mention of misadventure at the time of the procedure: Secondary | ICD-10-CM | POA: Diagnosis present

## 2022-01-27 DIAGNOSIS — H9190 Unspecified hearing loss, unspecified ear: Secondary | ICD-10-CM | POA: Diagnosis present

## 2022-01-27 DIAGNOSIS — R933 Abnormal findings on diagnostic imaging of other parts of digestive tract: Secondary | ICD-10-CM | POA: Diagnosis not present

## 2022-01-27 DIAGNOSIS — K5641 Fecal impaction: Secondary | ICD-10-CM | POA: Diagnosis present

## 2022-01-27 DIAGNOSIS — T839XXA Unspecified complication of genitourinary prosthetic device, implant and graft, initial encounter: Secondary | ICD-10-CM | POA: Insufficient documentation

## 2022-01-27 DIAGNOSIS — R404 Transient alteration of awareness: Secondary | ICD-10-CM | POA: Diagnosis not present

## 2022-01-27 DIAGNOSIS — Z7982 Long term (current) use of aspirin: Secondary | ICD-10-CM

## 2022-01-27 DIAGNOSIS — L8990 Pressure ulcer of unspecified site, unspecified stage: Secondary | ICD-10-CM | POA: Insufficient documentation

## 2022-01-27 DIAGNOSIS — K621 Rectal polyp: Secondary | ICD-10-CM | POA: Diagnosis not present

## 2022-01-27 DIAGNOSIS — Z955 Presence of coronary angioplasty implant and graft: Secondary | ICD-10-CM | POA: Diagnosis not present

## 2022-01-27 DIAGNOSIS — R339 Retention of urine, unspecified: Secondary | ICD-10-CM | POA: Diagnosis not present

## 2022-01-27 DIAGNOSIS — Z833 Family history of diabetes mellitus: Secondary | ICD-10-CM

## 2022-01-27 DIAGNOSIS — R5381 Other malaise: Secondary | ICD-10-CM | POA: Diagnosis not present

## 2022-01-27 DIAGNOSIS — K6389 Other specified diseases of intestine: Secondary | ICD-10-CM | POA: Insufficient documentation

## 2022-01-27 DIAGNOSIS — E785 Hyperlipidemia, unspecified: Secondary | ICD-10-CM | POA: Diagnosis not present

## 2022-01-27 DIAGNOSIS — Z1810 Retained metal fragments, unspecified: Secondary | ICD-10-CM | POA: Diagnosis not present

## 2022-01-27 DIAGNOSIS — I10 Essential (primary) hypertension: Secondary | ICD-10-CM | POA: Diagnosis not present

## 2022-01-27 DIAGNOSIS — I2510 Atherosclerotic heart disease of native coronary artery without angina pectoris: Secondary | ICD-10-CM | POA: Diagnosis present

## 2022-01-27 DIAGNOSIS — T8130XA Disruption of wound, unspecified, initial encounter: Secondary | ICD-10-CM | POA: Diagnosis present

## 2022-01-27 DIAGNOSIS — T8131XA Disruption of external operation (surgical) wound, not elsewhere classified, initial encounter: Secondary | ICD-10-CM | POA: Diagnosis not present

## 2022-01-27 DIAGNOSIS — Z7401 Bed confinement status: Secondary | ICD-10-CM

## 2022-01-27 DIAGNOSIS — Z743 Need for continuous supervision: Secondary | ICD-10-CM | POA: Diagnosis not present

## 2022-01-27 DIAGNOSIS — N5089 Other specified disorders of the male genital organs: Secondary | ICD-10-CM | POA: Diagnosis present

## 2022-01-27 DIAGNOSIS — N390 Urinary tract infection, site not specified: Secondary | ICD-10-CM | POA: Diagnosis present

## 2022-01-27 DIAGNOSIS — R609 Edema, unspecified: Secondary | ICD-10-CM | POA: Diagnosis not present

## 2022-01-27 DIAGNOSIS — Z7189 Other specified counseling: Secondary | ICD-10-CM | POA: Diagnosis not present

## 2022-01-27 DIAGNOSIS — I252 Old myocardial infarction: Secondary | ICD-10-CM

## 2022-01-27 DIAGNOSIS — Z0389 Encounter for observation for other suspected diseases and conditions ruled out: Secondary | ICD-10-CM | POA: Diagnosis not present

## 2022-01-27 DIAGNOSIS — R369 Urethral discharge, unspecified: Secondary | ICD-10-CM

## 2022-01-27 DIAGNOSIS — T839XXS Unspecified complication of genitourinary prosthetic device, implant and graft, sequela: Secondary | ICD-10-CM | POA: Diagnosis not present

## 2022-01-27 LAB — COMPREHENSIVE METABOLIC PANEL
ALT: 11 U/L (ref 0–44)
AST: 25 U/L (ref 15–41)
Albumin: 2.9 g/dL — ABNORMAL LOW (ref 3.5–5.0)
Alkaline Phosphatase: 62 U/L (ref 38–126)
Anion gap: 6 (ref 5–15)
BUN: 13 mg/dL (ref 8–23)
CO2: 22 mmol/L (ref 22–32)
Calcium: 8.2 mg/dL — ABNORMAL LOW (ref 8.9–10.3)
Chloride: 110 mmol/L (ref 98–111)
Creatinine, Ser: 1.3 mg/dL — ABNORMAL HIGH (ref 0.61–1.24)
GFR, Estimated: 52 mL/min — ABNORMAL LOW (ref >60)
Glucose, Bld: 143 mg/dL — ABNORMAL HIGH (ref 70–99)
Potassium: 3.3 mmol/L — ABNORMAL LOW (ref 3.5–5.1)
Sodium: 138 mmol/L (ref 135–145)
Total Bilirubin: 1.1 mg/dL (ref 0.3–1.2)
Total Protein: 7.3 g/dL (ref 6.5–8.1)

## 2022-01-27 LAB — I-STAT CHEM 8, ED
BUN: 11 mg/dL (ref 8–23)
Calcium, Ion: 1.11 mmol/L — ABNORMAL LOW (ref 1.15–1.40)
Chloride: 109 mmol/L (ref 98–111)
Creatinine, Ser: 1.2 mg/dL (ref 0.61–1.24)
Glucose, Bld: 135 mg/dL — ABNORMAL HIGH (ref 70–99)
HCT: 37 % — ABNORMAL LOW (ref 39.0–52.0)
Hemoglobin: 12.6 g/dL — ABNORMAL LOW (ref 13.0–17.0)
Potassium: 3.4 mmol/L — ABNORMAL LOW (ref 3.5–5.1)
Sodium: 144 mmol/L (ref 135–145)
TCO2: 20 mmol/L — ABNORMAL LOW (ref 22–32)

## 2022-01-27 LAB — URINALYSIS, ROUTINE W REFLEX MICROSCOPIC
Bilirubin Urine: NEGATIVE
Glucose, UA: NEGATIVE mg/dL
Ketones, ur: NEGATIVE mg/dL
Nitrite: POSITIVE — AB
Protein, ur: 100 mg/dL — AB
RBC / HPF: >50 RBC/hpf — ABNORMAL HIGH (ref 0–5)
Specific Gravity, Urine: 1.024 (ref 1.005–1.030)
WBC, UA: >50 WBC/hpf — ABNORMAL HIGH (ref 0–5)
pH: 6 (ref 5.0–8.0)

## 2022-01-27 LAB — LACTIC ACID, PLASMA
Lactic Acid, Venous: 2 mmol/L (ref 0.5–1.9)
Lactic Acid, Venous: 1.9 mmol/L (ref 0.5–1.9)

## 2022-01-27 LAB — CBC WITH DIFFERENTIAL/PLATELET
Abs Immature Granulocytes: 0.01 10*3/uL (ref 0.00–0.07)
Basophils Absolute: 0 10*3/uL (ref 0.0–0.1)
Basophils Relative: 1 %
Eosinophils Absolute: 0.2 10*3/uL (ref 0.0–0.5)
Eosinophils Relative: 4 %
HCT: 33.1 % — ABNORMAL LOW (ref 39.0–52.0)
Hemoglobin: 10.6 g/dL — ABNORMAL LOW (ref 13.0–17.0)
Immature Granulocytes: 0 %
Lymphocytes Relative: 29 %
Lymphs Abs: 1.5 10*3/uL (ref 0.7–4.0)
MCH: 33.7 pg (ref 26.0–34.0)
MCHC: 32 g/dL (ref 30.0–36.0)
MCV: 105.1 fL — ABNORMAL HIGH (ref 80.0–100.0)
Monocytes Absolute: 0.4 10*3/uL (ref 0.1–1.0)
Monocytes Relative: 9 %
Neutro Abs: 2.9 10*3/uL (ref 1.7–7.7)
Neutrophils Relative %: 57 %
Platelets: 163 10*3/uL (ref 150–400)
RBC: 3.15 MIL/uL — ABNORMAL LOW (ref 4.22–5.81)
RDW: 20.9 % — ABNORMAL HIGH (ref 11.5–15.5)
WBC: 5.1 10*3/uL (ref 4.0–10.5)
nRBC: 0.8 % — ABNORMAL HIGH (ref 0.0–0.2)

## 2022-01-27 LAB — MAGNESIUM: Magnesium: 2 mg/dL (ref 1.7–2.4)

## 2022-01-27 LAB — PROTIME-INR
INR: 1.1 (ref 0.8–1.2)
Prothrombin Time: 14.3 seconds (ref 11.4–15.2)

## 2022-01-27 LAB — SEDIMENTATION RATE: Sed Rate: 57 mm/hr — ABNORMAL HIGH (ref 0–16)

## 2022-01-27 LAB — C-REACTIVE PROTEIN: CRP: <0.5 mg/dL (ref <1.0)

## 2022-01-27 MED ORDER — LACTATED RINGERS IV BOLUS (SEPSIS)
500.0000 mL | Freq: Once | INTRAVENOUS | Status: AC
  Administered 2022-01-27: 500 mL via INTRAVENOUS

## 2022-01-27 MED ORDER — OXYCODONE-ACETAMINOPHEN 5-325 MG PO TABS
1.0000 | ORAL_TABLET | Freq: Once | ORAL | Status: AC
  Administered 2022-01-27: 1 via ORAL
  Filled 2022-01-27: qty 1

## 2022-01-27 MED ORDER — SODIUM CHLORIDE 0.9 % IV SOLN
2.0000 g | Freq: Once | INTRAVENOUS | Status: AC
  Administered 2022-01-27: 2 g via INTRAVENOUS
  Filled 2022-01-27: qty 12.5

## 2022-01-27 MED ORDER — IOHEXOL 300 MG/ML  SOLN
100.0000 mL | Freq: Once | INTRAMUSCULAR | Status: AC | PRN
  Administered 2022-01-27: 100 mL via INTRAVENOUS

## 2022-01-27 MED ORDER — METRONIDAZOLE 500 MG/100ML IV SOLN
500.0000 mg | Freq: Two times a day (BID) | INTRAVENOUS | Status: DC
  Administered 2022-01-27 – 2022-01-28 (×2): 500 mg via INTRAVENOUS
  Filled 2022-01-27 (×2): qty 100

## 2022-01-27 NOTE — ED Provider Notes (Signed)
[facility] Provider Note   CSN: [id number] Arrival date & time: 01/27/22  1710     History  Chief Complaint  Patient presents with   Groin Swelling    Jessey Stehlin is a 89 y.o. male.  HPI Patient presents for groin swelling.  Medical history includes HTN, HLD, CAD, CKD, MGUS, DM.  On 11/15, he had bilateral hydrocelectomy.  He was hospitalized in late November for encephalopathy.  At the time, he has severe scrotal edema.  A Foley catheter was placed on 11/25.  Plan was for to remain in place until edema resolves.  He was discharged on 12/7.  He was residing at [facility] facility.  He returned home on 1/5.  He has had ongoing swelling around his catheter area.    Home Medications Prior to Admission medications   Medication Sig Start Date End Date Taking? Authorizing Provider  amLODipine (NORVASC) 5 MG tablet Take 1 tablet (5 mg total) by mouth daily. 12/13/21   Dwyane Dee, MD  aspirin EC 81 MG tablet Take 1 tablet (81 mg total) by mouth daily. Swallow whole. 09/13/21   Donato Heinz, MD  carvedilol (COREG) 6.25 MG tablet Take 1 tablet (6.25 mg total) by mouth 2 (two) times daily. 09/13/21   Donato Heinz, MD  cholecalciferol (VITAMIN D) 1000 units tablet Take 1,000 Units by mouth daily.    [provider]  cyanocobalamin (VITAMIN B12) 1000 MCG tablet Take 1,000 mcg by mouth daily.    [provider]  folic acid (FOLVITE) 1 MG tablet Take 1 tablet (1 mg total) by mouth daily. 12/13/21   Dwyane Dee, MD  tamsulosin (FLOMAX) 0.4 MG CAPS capsule Take 0.4 mg by mouth daily.    [provider]  TRAVATAN Z 0.004 % SOLN ophthalmic solution Place 1 drop into both eyes 2 (two) times daily. 04/13/17   [provider]      Allergies    Patient has no known allergies.    Review of Systems   Review of Systems  Genitourinary:  Positive for scrotal swelling.  All other systems  reviewed and are negative.   Physical Exam Updated Vital Signs BP (!) 169/84 (BP Location: Left Arm)   Pulse 61   Temp (!) 97.3 F (36.3 C) (Oral)   Resp 20   Ht '5\' 6"'$  (1.676 m)   Wt 84.7 kg   SpO2 99%   BMI 30.14 kg/m  Physical Exam Vitals and nursing note reviewed.  Constitutional:      General: He is not in acute distress.    Appearance: He is well-developed. He is ill-appearing (Chronically). He is not toxic-appearing or diaphoretic.  HENT:     Head: Normocephalic and atraumatic.     Right Ear: External ear normal.     Left Ear: External ear normal.     Nose: Nose normal.     Mouth/Throat:     Mouth: Mucous membranes are moist.  Eyes:     Extraocular Movements: Extraocular movements intact.     Conjunctiva/sclera: Conjunctivae normal.  Cardiovascular:     Rate and Rhythm: Normal rate and regular rhythm.  Pulmonary:     Effort: Pulmonary effort is normal. No respiratory distress.     Breath sounds: Normal breath sounds.  Abdominal:     General: There is no distension.     Palpations: Abdomen is soft.     Tenderness: There is no abdominal tenderness.  Genitourinary:  Comments: Large scrotal swelling.  There is a old wound on posterior aspect of scrotum.  Wound itself does not have any drainage.  He does have purulent drainage from catheter insertion site. Musculoskeletal:        General: No swelling.     Cervical back: Normal range of motion and neck supple.  Skin:    General: Skin is warm and dry.     Capillary Refill: Capillary refill takes less than 2 seconds.     Coloration: Skin is not jaundiced or pale.  Neurological:     General: No focal deficit present.     Mental Status: He is alert. He is disoriented.  Psychiatric:        Mood and Affect: Mood normal.        Behavior: Behavior normal.     ED Results / Procedures / Treatments   Labs (all labs ordered are listed, but only abnormal results are displayed) Labs Reviewed  LACTIC ACID, PLASMA -  Abnormal; Notable for the following components:      Result Value   Lactic Acid, Venous 2.0 (*)    All other components within normal limits  COMPREHENSIVE METABOLIC PANEL - Abnormal; Notable for the following components:   Potassium 3.3 (*)    Glucose, Bld 143 (*)    Creatinine, Ser 1.30 (*)    Calcium 8.2 (*)    Albumin 2.9 (*)    GFR, Estimated 52 (*)    All other components within normal limits  CBC WITH DIFFERENTIAL/PLATELET - Abnormal; Notable for the following components:   RBC 3.15 (*)    Hemoglobin 10.6 (*)    HCT 33.1 (*)    MCV 105.1 (*)    RDW 20.9 (*)    nRBC 0.8 (*)    All other components within normal limits  URINALYSIS, ROUTINE W REFLEX MICROSCOPIC - Abnormal; Notable for the following components:   Color, Urine AMBER (*)    APPearance TURBID (*)    Hgb urine dipstick MODERATE (*)    Protein, ur 100 (*)    Nitrite POSITIVE (*)    Leukocytes,Ua MODERATE (*)    RBC / HPF >50 (*)    WBC, UA >50 (*)    Bacteria, UA MANY (*)    All other components within normal limits  SEDIMENTATION RATE - Abnormal; Notable for the following components:   Sed Rate 57 (*)    All other components within normal limits  I-STAT CHEM 8, ED - Abnormal; Notable for the following components:   Potassium 3.4 (*)    Glucose, Bld 135 (*)    Calcium, Ion 1.11 (*)    TCO2 20 (*)    Hemoglobin 12.6 (*)    HCT 37.0 (*)    All other components within normal limits  CULTURE, BLOOD (ROUTINE X 2)  CULTURE, BLOOD (ROUTINE X 2)  URINE CULTURE  LACTIC ACID, PLASMA  PROTIME-INR  MAGNESIUM  C-REACTIVE PROTEIN  COMPREHENSIVE METABOLIC PANEL  MAGNESIUM  CBC WITH DIFFERENTIAL/PLATELET  POC OCCULT BLOOD, ED    EKG None  Radiology CT ABDOMEN PELVIS W CONTRAST  Result Date: 01/27/2022 CLINICAL DATA:  Abdomen pain EXAM: CT ABDOMEN AND PELVIS WITH CONTRAST TECHNIQUE: Multidetector CT imaging of the abdomen and pelvis was performed using the standard protocol following bolus administration of  intravenous contrast. RADIATION DOSE REDUCTION: This exam was performed according to the departmental dose-optimization program which includes automated exposure control, adjustment of the mA and/or kV according to patient size and/or use  of iterative reconstruction technique. CONTRAST:  153m OMNIPAQUE IOHEXOL 300 MG/ML  SOLN COMPARISON:  CT 07/27/2021 FINDINGS: Lower chest: Lung bases demonstrate no acute airspace disease. Mild cardiomegaly. Coronary vascular calcification. Hepatobiliary: No focal liver abnormality is seen. No gallstones, gallbladder wall thickening, or biliary dilatation. Pancreas: Unremarkable. No pancreatic ductal dilatation or surrounding inflammatory changes. Spleen: Normal in size without focal abnormality. Adrenals/Urinary Tract: Adrenal glands are within normal limits. Kidneys show no hydronephrosis. Bilateral renal cysts, no imaging follow-up is recommended. The bladder is decompressed by Foley catheter. Bladder is displaced anteriorly. Stomach/Bowel: Stomach is nonenlarged. No dilated small bowel. Large volume of stool throughout the colon suggestive of constipation. Rectal distension by impacted feces. Perirectal and presacral edema and mild soft tissue stranding. Possible irregular focus of colon narrowing upstream to the impacted feces at the rectosigmoid colon, series 2, image 63 and coronal series 5, image 57. Negative appendix Vascular/Lymphatic: Moderate aortic atherosclerosis. No aneurysm. No suspicious lymph nodes. Reproductive: Negative for mass. Other: Negative for pelvic effusion or free air. Fat containing inguinal hernias. Hazy appearance of central mesentery without mass or adenopathy as before Musculoskeletal: No acute osseous abnormality. IMPRESSION: 1. Large volume of stool throughout the colon suggestive of constipation. Rectal distension by impacted feces with perirectal and presacral edema and soft tissue stranding, question stercoral colitis. Suspected area of  colonic narrowing/possible mass at the rectosigmoid colon just upstream to the rectal impaction, this may be further assessed with colonoscopy. 2. No other acute intra-abdominal or pelvic abnormality is seen. 3. Aortic atherosclerosis. Aortic Atherosclerosis (ICD10-I70.0). Electronically Signed   By: KDonavan FoilM.D.   On: 01/27/2022 20:38   DG Chest Port 1 View  Result Date: 01/27/2022 CLINICAL DATA:  Questionable sepsis EXAM: PORTABLE CHEST 1 VIEW COMPARISON:  Chest x-ray 11/30/2021 FINDINGS: The heart size and mediastinal contours are within normal limits. Both lungs are clear. The visualized skeletal structures are unremarkable. Punctate metallic foreign bodies are seen in the left chest wall, unchanged. IMPRESSION: No active disease. Electronically Signed   By: ARonney AstersM.D.   On: 01/27/2022 18:28    Procedures Fecal disimpaction  Date/Time: 01/27/2022 10:50 PM  Performed by: DGodfrey Pick MD Authorized by: DGodfrey Pick MD  Consent: Verbal consent obtained. Consent given by: patient Patient understanding: patient states understanding of the procedure being performed Patient identity confirmed: verbally with patient Local anesthesia used: no  Anesthesia: Local anesthesia used: no  Sedation: Patient sedated: no  Patient tolerance: patient tolerated the procedure well with no immediate complications       Medications Ordered in ED Medications  metroNIDAZOLE (FLAGYL) IVPB 500 mg (0 mg Intravenous Stopped 01/27/22 2221)  aspirin EC tablet 81 mg (has no administration in time range)  amLODipine (NORVASC) tablet 5 mg (has no administration in time range)  carvedilol (COREG) tablet 6.25 mg (has no administration in time range)  tamsulosin (FLOMAX) capsule 0.4 mg (has no administration in time range)  potassium chloride (KLOR-CON) packet 40 mEq (has no administration in time range)  heparin injection 5,000 Units (has no administration in time range)  0.9 %  sodium chloride  infusion ( Intravenous New Bag/Given 01/28/22 0109)  acetaminophen (TYLENOL) tablet 650 mg (has no administration in time range)    Or  acetaminophen (TYLENOL) suppository 650 mg (has no administration in time range)  oxyCODONE (Oxy IR/ROXICODONE) immediate release tablet 5 mg (has no administration in time range)  ondansetron (ZOFRAN) tablet 4 mg (has no administration in time range)    Or  ondansetron (  ZOFRAN) injection 4 mg (has no administration in time range)  cefTRIAXone (ROCEPHIN) 1 g in sodium chloride 0.9 % 100 mL IVPB (1 g Intravenous New Bag/Given 01/28/22 0210)  lactated ringers bolus 500 mL (0 mLs Intravenous Stopped 01/27/22 1939)  lactated ringers bolus 500 mL (0 mLs Intravenous Stopped 01/27/22 2015)  oxyCODONE-acetaminophen (PERCOCET/ROXICET) 5-325 MG per tablet 1 tablet (1 tablet Oral Given 01/27/22 1835)  ceFEPIme (MAXIPIME) 2 g in sodium chloride 0.9 % 100 mL IVPB (0 g Intravenous Stopped 01/27/22 2015)  lactated ringers bolus 500 mL (0 mLs Intravenous Stopped 01/27/22 2201)  iohexol (OMNIPAQUE) 300 MG/ML solution 100 mL (100 mLs Intravenous Contrast Given 01/27/22 2000)    ED Course/ Medical Decision Making/ A&P                             Medical Decision Making Amount and/or Complexity of Data Reviewed Labs: ordered. Radiology: ordered. ECG/medicine tests: ordered.  Risk Prescription drug management. Decision regarding hospitalization.   This patient presents to the ED for concern of scrotal swelling, this involves an extensive number of treatment options, and is a complaint that carries with it a high risk of complications and morbidity.  The differential diagnosis includes recurrence of hydroceles, edema, infection   Co morbidities that complicate the patient evaluation  HTN, HLD, CAD, CKD, MGUS, DM   Additional history obtained:  Additional history obtained from patient's daughter External records from outside source obtained and reviewed including  EMR   Lab Tests:  I Ordered, and personally interpreted labs.  The pertinent results include: Baseline anemia, no leukocytosis, mild hypokalemia, baseline creatinine, nitrite positive bacteriuria and pyuria on urinalysis   Imaging Studies ordered:  I ordered imaging studies including chest x-ray, CT of abdomen and pelvis I independently visualized and interpreted imaging which showed large stool burden with rectal distention and perirectal stranding concerning for stercoral colitis I agree with the radiologist interpretation   Cardiac Monitoring: / EKG:  The patient was maintained on a cardiac monitor.  I personally viewed and interpreted the cardiac monitored which showed an underlying rhythm of: Sinus rhythm   Consultations Obtained:  I requested consultation with the gastroenterologist, Dr. Abbey Chatters,  and discussed lab and imaging findings as well as pertinent plan - they recommend: Enema tonight, n.p.o. at midnight, GI will see in the morning   Problem List / ED Course / Critical interventions / Medication management  Patient presents for scrotal swelling.  History is very limited by EMS.  Per chart review, does appear that he had an admission 2 months ago for encephalopathy with scrotal edema.  Foley catheter was placed at the time.  He reportedly returned home from nursing facility 2 weeks ago.  Family called EMS for reported swelling around his catheter site.  On assessment, there is some significant swelling of the scrotum.  Catheter insertion site is obstructed by the swelling.  There is some evidence of purulent drainage from this area.  There is an old wound on the posterior aspect of his scrotum.  A brown-colored urine is draining into Foley bag.  Patient, himself, is a poor historian.  He denies any new pain in this area.  Broad diagnostic workup was initiated.  Cefepime was ordered for empiric treatment of catheter related infection.  Further history to be obtained from family.   Patient daughter arrived at bedside.  She states that the posterior scrotal swelling has improved, however, the anterior aspect has worsened.  She does state that he has also had increased pain lately in the area of urethra.  She states that color of urine has not changed.  Foley bag was last emptied at 2:00 this afternoon.  She also states that he has had ongoing constipation.  On CT imaging, there is a large stool burden with findings concerning for stercoral colitis in addition to a possible associated mass.  I discussed this with gastroenterologist on-call who recommends soapsuds enema tonight and n.p.o. at midnight.  GI will see in the morning.  Patient underwent manual disimpaction of a large amount of stool followed by enema.  Flagyl was ordered.  Patient was admitted for further management. I ordered medication including Percocet for analgesia; IV fluids for hydration; cefepime and Flagyl for UTI and concern of stercoral colitis. Reevaluation of the patient after these medicines showed that the patient improved I have reviewed the patients home medicines and have made adjustments as needed   Social Determinants of Health:  Lives at home with family         Final Clinical Impression(s) / ED Diagnoses Final diagnoses:  Constipation, unspecified constipation type  Impacted stool in rectum [facility])  Urethral discharge    Rx / DC Orders ED Discharge Orders     None         Godfrey Pick, MD 01/28/22 0214

## 2022-01-27 NOTE — ED Triage Notes (Signed)
Pt BIB [facility] from home. Had procedure done at [facility] with catheter placement, has lead to swelling and pain around site. Doren Custard, MD at bedside to assess.

## 2022-01-27 NOTE — Telephone Encounter (Signed)
Patient call was received stating patient was discharged from the rehabilitation center January 5 th 2024 and has catheter in place, he reports having swelling around the penis area and wanted to have that looked at. He is not able to get in and out of the car easily and will was asking his family member to arrange for a stretcher transportation. Daughter is able to do that through the medicaid transportation. She decided will send him to ER for evaluation.

## 2022-01-28 ENCOUNTER — Observation Stay (HOSPITAL_COMMUNITY): Payer: Medicare Other | Admitting: Anesthesiology

## 2022-01-28 ENCOUNTER — Encounter (HOSPITAL_COMMUNITY): Payer: Self-pay | Admitting: Family Medicine

## 2022-01-28 ENCOUNTER — Encounter (HOSPITAL_COMMUNITY)
Admission: EM | Disposition: A | Discharge status: Home Health | Payer: Self-pay | Source: Home / Self Care | Attending: Family Medicine

## 2022-01-28 DIAGNOSIS — K5289 Other specified noninfective gastroenteritis and colitis: Secondary | ICD-10-CM

## 2022-01-28 DIAGNOSIS — Z8249 Family history of ischemic heart disease and other diseases of the circulatory system: Secondary | ICD-10-CM | POA: Diagnosis not present

## 2022-01-28 DIAGNOSIS — Z9841 Cataract extraction status, right eye: Secondary | ICD-10-CM | POA: Diagnosis not present

## 2022-01-28 DIAGNOSIS — K626 Ulcer of anus and rectum: Secondary | ICD-10-CM | POA: Diagnosis not present

## 2022-01-28 DIAGNOSIS — L8990 Pressure ulcer of unspecified site, unspecified stage: Secondary | ICD-10-CM | POA: Insufficient documentation

## 2022-01-28 DIAGNOSIS — Z87891 Personal history of nicotine dependence: Secondary | ICD-10-CM | POA: Diagnosis not present

## 2022-01-28 DIAGNOSIS — T83511A Infection and inflammatory reaction due to indwelling urethral catheter, initial encounter: Secondary | ICD-10-CM | POA: Diagnosis present

## 2022-01-28 DIAGNOSIS — N390 Urinary tract infection, site not specified: Secondary | ICD-10-CM | POA: Diagnosis present

## 2022-01-28 DIAGNOSIS — K5641 Fecal impaction: Secondary | ICD-10-CM

## 2022-01-28 DIAGNOSIS — L89892 Pressure ulcer of other site, stage 2: Secondary | ICD-10-CM | POA: Diagnosis present

## 2022-01-28 DIAGNOSIS — E872 Acidosis, unspecified: Secondary | ICD-10-CM | POA: Diagnosis not present

## 2022-01-28 DIAGNOSIS — D539 Nutritional anemia, unspecified: Secondary | ICD-10-CM | POA: Diagnosis present

## 2022-01-28 DIAGNOSIS — B9562 Methicillin resistant Staphylococcus aureus infection as the cause of diseases classified elsewhere: Secondary | ICD-10-CM | POA: Diagnosis present

## 2022-01-28 DIAGNOSIS — Y846 Urinary catheterization as the cause of abnormal reaction of the patient, or of later complication, without mention of misadventure at the time of the procedure: Secondary | ICD-10-CM | POA: Diagnosis present

## 2022-01-28 DIAGNOSIS — E785 Hyperlipidemia, unspecified: Secondary | ICD-10-CM | POA: Diagnosis present

## 2022-01-28 DIAGNOSIS — I2510 Atherosclerotic heart disease of native coronary artery without angina pectoris: Secondary | ICD-10-CM | POA: Diagnosis present

## 2022-01-28 DIAGNOSIS — K648 Other hemorrhoids: Secondary | ICD-10-CM | POA: Diagnosis not present

## 2022-01-28 DIAGNOSIS — I129 Hypertensive chronic kidney disease with stage 1 through stage 4 chronic kidney disease, or unspecified chronic kidney disease: Secondary | ICD-10-CM | POA: Diagnosis present

## 2022-01-28 DIAGNOSIS — N1831 Chronic kidney disease, stage 3a: Secondary | ICD-10-CM | POA: Diagnosis present

## 2022-01-28 DIAGNOSIS — T839XXS Unspecified complication of genitourinary prosthetic device, implant and graft, sequela: Secondary | ICD-10-CM | POA: Diagnosis not present

## 2022-01-28 DIAGNOSIS — F0390 Unspecified dementia without behavioral disturbance: Secondary | ICD-10-CM | POA: Diagnosis present

## 2022-01-28 DIAGNOSIS — Z955 Presence of coronary angioplasty implant and graft: Secondary | ICD-10-CM | POA: Diagnosis not present

## 2022-01-28 DIAGNOSIS — R933 Abnormal findings on diagnostic imaging of other parts of digestive tract: Secondary | ICD-10-CM | POA: Diagnosis not present

## 2022-01-28 DIAGNOSIS — Z515 Encounter for palliative care: Secondary | ICD-10-CM | POA: Diagnosis not present

## 2022-01-28 DIAGNOSIS — Y839 Surgical procedure, unspecified as the cause of abnormal reaction of the patient, or of later complication, without mention of misadventure at the time of the procedure: Secondary | ICD-10-CM | POA: Diagnosis present

## 2022-01-28 DIAGNOSIS — I252 Old myocardial infarction: Secondary | ICD-10-CM | POA: Diagnosis not present

## 2022-01-28 DIAGNOSIS — T8131XA Disruption of external operation (surgical) wound, not elsewhere classified, initial encounter: Secondary | ICD-10-CM

## 2022-01-28 DIAGNOSIS — K6389 Other specified diseases of intestine: Secondary | ICD-10-CM | POA: Insufficient documentation

## 2022-01-28 DIAGNOSIS — Z7189 Other specified counseling: Secondary | ICD-10-CM | POA: Diagnosis not present

## 2022-01-28 DIAGNOSIS — R339 Retention of urine, unspecified: Secondary | ICD-10-CM | POA: Diagnosis not present

## 2022-01-28 DIAGNOSIS — E876 Hypokalemia: Secondary | ICD-10-CM | POA: Diagnosis present

## 2022-01-28 DIAGNOSIS — E1122 Type 2 diabetes mellitus with diabetic chronic kidney disease: Secondary | ICD-10-CM | POA: Diagnosis present

## 2022-01-28 DIAGNOSIS — N3000 Acute cystitis without hematuria: Secondary | ICD-10-CM | POA: Diagnosis not present

## 2022-01-28 DIAGNOSIS — Z9842 Cataract extraction status, left eye: Secondary | ICD-10-CM | POA: Diagnosis not present

## 2022-01-28 DIAGNOSIS — T8130XA Disruption of wound, unspecified, initial encounter: Secondary | ICD-10-CM | POA: Diagnosis present

## 2022-01-28 HISTORY — PX: IMPACTION REMOVAL: SHX5858

## 2022-01-28 HISTORY — PX: BIOPSY: SHX5522

## 2022-01-28 HISTORY — PX: FLEXIBLE SIGMOIDOSCOPY: SHX5431

## 2022-01-28 LAB — CBC WITH DIFFERENTIAL/PLATELET
Abs Immature Granulocytes: 0.01 10*3/uL (ref 0.00–0.07)
Basophils Absolute: 0 10*3/uL (ref 0.0–0.1)
Basophils Relative: 1 %
Eosinophils Absolute: 0.2 10*3/uL (ref 0.0–0.5)
Eosinophils Relative: 4 %
HCT: 26.3 % — ABNORMAL LOW (ref 39.0–52.0)
Hemoglobin: 8.8 g/dL — ABNORMAL LOW (ref 13.0–17.0)
Immature Granulocytes: 0 %
Lymphocytes Relative: 29 %
Lymphs Abs: 1.5 10*3/uL (ref 0.7–4.0)
MCH: 34.8 pg — ABNORMAL HIGH (ref 26.0–34.0)
MCHC: 33.5 g/dL (ref 30.0–36.0)
MCV: 104 fL — ABNORMAL HIGH (ref 80.0–100.0)
Monocytes Absolute: 0.6 10*3/uL (ref 0.1–1.0)
Monocytes Relative: 11 %
Neutro Abs: 3 10*3/uL (ref 1.7–7.7)
Neutrophils Relative %: 55 %
Platelets: 161 10*3/uL (ref 150–400)
RBC: 2.53 MIL/uL — ABNORMAL LOW (ref 4.22–5.81)
RDW: 20.6 % — ABNORMAL HIGH (ref 11.5–15.5)
WBC: 5.4 10*3/uL (ref 4.0–10.5)
nRBC: 0.6 % — ABNORMAL HIGH (ref 0.0–0.2)

## 2022-01-28 LAB — COMPREHENSIVE METABOLIC PANEL
ALT: 9 U/L (ref 0–44)
AST: 19 U/L (ref 15–41)
Albumin: 2.4 g/dL — ABNORMAL LOW (ref 3.5–5.0)
Alkaline Phosphatase: 48 U/L (ref 38–126)
Anion gap: 3 — ABNORMAL LOW (ref 5–15)
BUN: 11 mg/dL (ref 8–23)
CO2: 22 mmol/L (ref 22–32)
Calcium: 7.8 mg/dL — ABNORMAL LOW (ref 8.9–10.3)
Chloride: 113 mmol/L — ABNORMAL HIGH (ref 98–111)
Creatinine, Ser: 1.11 mg/dL (ref 0.61–1.24)
GFR, Estimated: >60 mL/min (ref >60)
Glucose, Bld: 98 mg/dL (ref 70–99)
Potassium: 3.6 mmol/L (ref 3.5–5.1)
Sodium: 138 mmol/L (ref 135–145)
Total Bilirubin: 0.7 mg/dL (ref 0.3–1.2)
Total Protein: 5.9 g/dL — ABNORMAL LOW (ref 6.5–8.1)

## 2022-01-28 LAB — MAGNESIUM: Magnesium: 1.8 mg/dL (ref 1.7–2.4)

## 2022-01-28 LAB — POC OCCULT BLOOD, ED: Fecal Occult Blood: NEGATIVE

## 2022-01-28 SURGERY — SIGMOIDOSCOPY, FLEXIBLE
Anesthesia: General

## 2022-01-28 MED ORDER — AMLODIPINE BESYLATE 5 MG PO TABS
5.0000 mg | ORAL_TABLET | Freq: Every day | ORAL | Status: DC
  Administered 2022-01-28 – 2022-01-31 (×4): 5 mg via ORAL
  Filled 2022-01-28 (×4): qty 1

## 2022-01-28 MED ORDER — TAMSULOSIN HCL 0.4 MG PO CAPS
0.4000 mg | ORAL_CAPSULE | Freq: Every day | ORAL | Status: DC
  Administered 2022-01-28 – 2022-01-31 (×4): 0.4 mg via ORAL
  Filled 2022-01-28 (×4): qty 1

## 2022-01-28 MED ORDER — CHLORHEXIDINE GLUCONATE CLOTH 2 % EX PADS
6.0000 | MEDICATED_PAD | Freq: Every day | CUTANEOUS | Status: DC
  Administered 2022-01-29 – 2022-01-31 (×3): 6 via TOPICAL

## 2022-01-28 MED ORDER — POTASSIUM CHLORIDE CRYS ER 20 MEQ PO TBCR
40.0000 meq | EXTENDED_RELEASE_TABLET | ORAL | Status: AC
  Administered 2022-01-28 (×2): 40 meq via ORAL
  Filled 2022-01-28 (×2): qty 2

## 2022-01-28 MED ORDER — PROPOFOL 500 MG/50ML IV EMUL
INTRAVENOUS | Status: DC | PRN
  Administered 2022-01-28: 150 ug/kg/min via INTRAVENOUS

## 2022-01-28 MED ORDER — ONDANSETRON HCL 4 MG/2ML IJ SOLN: 4.0000 mg | Freq: Four times a day (QID) | INTRAMUSCULAR | Status: DC | PRN

## 2022-01-28 MED ORDER — SODIUM CHLORIDE 0.9 % IV SOLN: INTRAVENOUS | Status: DC

## 2022-01-28 MED ORDER — ASPIRIN 81 MG PO TBEC
81.0000 mg | DELAYED_RELEASE_TABLET | Freq: Every day | ORAL | Status: DC
  Administered 2022-01-28 – 2022-01-31 (×4): 81 mg via ORAL
  Filled 2022-01-28 (×4): qty 1

## 2022-01-28 MED ORDER — LACTATED RINGERS IV SOLN: INTRAVENOUS | Status: DC

## 2022-01-28 MED ORDER — LATANOPROST 0.005 % OP SOLN
1.0000 [drp] | Freq: Every day | OPHTHALMIC | Status: DC
  Administered 2022-01-28 – 2022-01-30 (×3): 1 [drp] via OPHTHALMIC
  Filled 2022-01-28: qty 2.5

## 2022-01-28 MED ORDER — VANCOMYCIN HCL 750 MG/150ML IV SOLN
750.0000 mg | INTRAVENOUS | Status: AC
  Administered 2022-01-29 – 2022-01-30 (×2): 750 mg via INTRAVENOUS
  Filled 2022-01-28 (×2): qty 150

## 2022-01-28 MED ORDER — POTASSIUM CHLORIDE 20 MEQ PO PACK
40.0000 meq | PACK | Freq: Once | ORAL | Status: AC
  Administered 2022-01-28: 40 meq via ORAL
  Filled 2022-01-28: qty 2

## 2022-01-28 MED ORDER — CARVEDILOL 3.125 MG PO TABS
6.2500 mg | ORAL_TABLET | Freq: Two times a day (BID) | ORAL | Status: DC
  Administered 2022-01-28 – 2022-01-31 (×8): 6.25 mg via ORAL
  Filled 2022-01-28 (×8): qty 2

## 2022-01-28 MED ORDER — HEPARIN SODIUM (PORCINE) 5000 UNIT/ML IJ SOLN
5000.0000 [IU] | Freq: Three times a day (TID) | INTRAMUSCULAR | Status: DC
  Administered 2022-01-28 – 2022-01-31 (×6): 5000 [IU] via SUBCUTANEOUS
  Filled 2022-01-28 (×7): qty 1

## 2022-01-28 MED ORDER — LIDOCAINE HCL (CARDIAC) PF 100 MG/5ML IV SOSY
PREFILLED_SYRINGE | INTRAVENOUS | Status: DC | PRN
  Administered 2022-01-28: 50 mg via INTRATRACHEAL

## 2022-01-28 MED ORDER — PROPOFOL 10 MG/ML IV BOLUS
INTRAVENOUS | Status: DC | PRN
  Administered 2022-01-28: 60 mg via INTRAVENOUS

## 2022-01-28 MED ORDER — FOLIC ACID 1 MG PO TABS
1.0000 mg | ORAL_TABLET | Freq: Every day | ORAL | Status: DC
  Administered 2022-01-28 – 2022-01-31 (×4): 1 mg via ORAL
  Filled 2022-01-28 (×4): qty 1

## 2022-01-28 MED ORDER — OXYCODONE HCL 5 MG PO TABS: 5.0000 mg | ORAL_TABLET | ORAL | Status: DC | PRN

## 2022-01-28 MED ORDER — POLYETHYLENE GLYCOL 3350 17 G PO PACK
17.0000 g | PACK | Freq: Two times a day (BID) | ORAL | Status: DC
  Administered 2022-01-28 – 2022-01-31 (×7): 17 g via ORAL
  Filled 2022-01-28 (×7): qty 1

## 2022-01-28 MED ORDER — ACETAMINOPHEN 325 MG PO TABS: 650.0000 mg | ORAL_TABLET | Freq: Four times a day (QID) | ORAL | Status: DC | PRN

## 2022-01-28 MED ORDER — ACETAMINOPHEN 650 MG RE SUPP: 650.0000 mg | Freq: Four times a day (QID) | RECTAL | Status: DC | PRN

## 2022-01-28 MED ORDER — SODIUM CHLORIDE 0.9 % IV SOLN
1.0000 g | INTRAVENOUS | Status: DC
  Administered 2022-01-28: 1 g via INTRAVENOUS
  Filled 2022-01-28: qty 10

## 2022-01-28 MED ORDER — VANCOMYCIN HCL IN DEXTROSE 1-5 GM/200ML-% IV SOLN
1000.0000 mg | Freq: Once | INTRAVENOUS | Status: AC
  Administered 2022-01-28: 1000 mg via INTRAVENOUS
  Filled 2022-01-28: qty 200

## 2022-01-28 MED ORDER — SODIUM CHLORIDE 0.9 % IV SOLN
2.0000 g | INTRAVENOUS | Status: AC
  Administered 2022-01-28 – 2022-01-30 (×3): 2 g via INTRAVENOUS
  Filled 2022-01-28 (×3): qty 12.5

## 2022-01-28 MED ORDER — ONDANSETRON HCL 4 MG PO TABS: 4.0000 mg | ORAL_TABLET | Freq: Four times a day (QID) | ORAL | Status: DC | PRN

## 2022-01-28 NOTE — Assessment & Plan Note (Signed)
-  Continue Norvasc and Coreg

## 2022-01-28 NOTE — Evaluation (Signed)
Physical Therapy Evaluation Patient Details Name: Terrance Miller MRN: [id number] DOB: April 14, 1928 Today's Date: 01/28/2022  History of Present Illness  Terrance Miller is a 89 y.o. male with medical history significant of coronary artery disease, hyperlipidemia, hypertension, type 2 diabetes mellitus, presents the ED with a chief complaint of Foley catheter pain and swelling of the penis.  Patient was most recently seen by urology on December 5.  He was status post hydrocelectomy with penoscrotal edema.  He had superficial wound dehiscence.  The plan was to continue wound care and continue Foley.  Does not look like he ever followed up with urology after discharge.  Patient reports that he does not know why he is here.  There is no family at bedside.  He is oriented to himself, place, and age, but he did get the year wrong.  Although he tells me he is in the hospital and names any pen, as well as leaving the room he said he is probably can have to go to the hospital today.  Is unclear if he is altered or if this is his baseline.  In any event, patient is not able to provide any history as he does not know why he is here.  He does report that he is not in any pain at this time.   Clinical Impression  Patient demonstrates slow labored movement for sitting up at bedside requiring assist to avoid shearing over scrotal wound, once seated patient slightly impulsive attempting to stand before cued, slow labored cadence with frequent veering to the left and once fatigued had near fall due to legs giving way requiring 2 person assist to make it to chair.  Patient tolerated sitting up in chair after therapy - RN aware.  Patient will benefit from continued skilled physical therapy in hospital and recommended venue below to increase strength, balance, endurance for safe ADLs and gait.          Recommendations for follow up therapy are one component of a multi-disciplinary discharge planning process, led by the attending  physician.  Recommendations may be updated based on patient status, additional functional criteria and insurance authorization.  Follow Up Recommendations Skilled nursing-short term rehab (<3 hours/day) Can patient physically be transported by private vehicle: Yes    Assistance Recommended at Discharge Intermittent Supervision/Assistance  Patient can return home with the following  A lot of help with bathing/dressing/bathroom;A lot of help with walking and/or transfers;Help with stairs or ramp for entrance;Assistance with cooking/housework    Equipment Recommendations None recommended by PT  Recommendations for Other Services       Functional Status Assessment Patient has had a recent decline in their functional status and demonstrates the ability to make significant improvements in function in a reasonable and predictable amount of time.     Precautions / Restrictions Precautions Precautions: Fall Restrictions Weight Bearing Restrictions: No      Mobility  Bed Mobility Overal bed mobility: Needs Assistance Bed Mobility: Rolling, Supine to Sit Rolling: Min assist, Mod assist   Supine to sit: Mod assist, Min assist     General bed mobility comments: increased time, labored movement    Transfers Overall transfer level: Needs assistance Equipment used: Rolling walker (2 wheels) Transfers: Sit to/from Stand, Bed to chair/wheelchair/BSC Sit to Stand: Mod assist   Step pivot transfers: Mod assist       General transfer comment: requires repeated verbal/tactile cueing for proper hand placement on RW during sit to stands    Ambulation/Gait Ambulation/Gait assistance:  Min assist, Mod assist Gait Distance (Feet): 30 Feet Assistive device: Rolling walker (2 wheels) Gait Pattern/deviations: Decreased step length - right, Decreased step length - left, Decreased stride length, Knees buckling Gait velocity: decreased     General Gait Details: slow labored cadence with near  loss of balance once fatigue resulting in buckling of knees requiring 2 person assist to prevent falling  Stairs            Wheelchair Mobility    Modified Rankin (Stroke Patients Only)       Balance Overall balance assessment: Needs assistance Sitting-balance support: Feet supported, No upper extremity supported Sitting balance-Leahy Scale: Fair Sitting balance - Comments: seated at EOB   Standing balance support: During functional activity, Reliant on assistive device for balance, Bilateral upper extremity supported Standing balance-Leahy Scale: Poor Standing balance comment: fair/poor using RW                             Pertinent Vitals/Pain Pain Assessment Pain Assessment: No/denies pain    Home Living Family/patient expects to be discharged to:: Private residence Living Arrangements: Children Available Help at Discharge: Family;Available 24 hours/day Type of Home: House Home Access: Stairs to enter Entrance Stairs-Rails: None Entrance Stairs-Number of Steps: 3 Alternate Level Stairs-Number of Steps: 4 Home Layout: Able to live on main level with bedroom/bathroom;Two level Home Equipment: Toilet riser;Cane - single point;Rolling Walker (2 wheels);Rollator (4 wheels) Additional Comments: Information taken from previous admission.    Prior Function Prior Level of Function : Needs assist  Cognitive Assist : Mobility (cognitive)     Physical Assist : Mobility (physical);ADLs (physical) Mobility (physical): Transfers;Gait;Bed mobility;Stairs   Mobility Comments: household ambulator using RW ADLs Comments: Assisted with bathing and dressing per pt's reports. Chart review indicates assist for IADL's as well.     Hand Dominance   Dominant Hand: Left    Extremity/Trunk Assessment   Upper Extremity Assessment Upper Extremity Assessment: Defer to OT evaluation    Lower Extremity Assessment Lower Extremity Assessment: Generalized weakness     Cervical / Trunk Assessment Cervical / Trunk Assessment: Normal  Communication   Communication: HOH  Cognition Arousal/Alertness: Awake/alert Behavior During Therapy: WFL for tasks assessed/performed Overall Cognitive Status: No family/caregiver present to determine baseline cognitive functioning                                          General Comments      Exercises     Assessment/Plan    PT Assessment Patient needs continued PT services  PT Problem List Decreased strength;Decreased activity tolerance;Decreased balance;Decreased mobility       PT Treatment Interventions DME instruction;Gait training;Stair training;Functional mobility training;Therapeutic activities;Therapeutic exercise;Patient/family education;Balance training    PT Goals (Current goals can be found in the Care Plan section)  Acute Rehab PT Goals Patient Stated Goal: return home with family to assist PT Goal Formulation: With patient Time For Goal Achievement: 02/11/22 Potential to Achieve Goals: Good    Frequency Min 3X/week     Co-evaluation PT/OT/SLP Co-Evaluation/Treatment: Yes Reason for Co-Treatment: To address functional/ADL transfers PT goals addressed during session: Mobility/safety with mobility;Balance;Proper use of DME         AM-PAC PT "6 Clicks" Mobility  Outcome Measure Help needed turning from your back to your side while in a flat bed without using bedrails?: A  Little Help needed moving from lying on your back to sitting on the side of a flat bed without using bedrails?: A Lot Help needed moving to and from a bed to a chair (including a wheelchair)?: A Lot Help needed standing up from a chair using your arms (e.g., wheelchair or bedside chair)?: A Lot Help needed to walk in hospital room?: A Lot Help needed climbing 3-5 steps with a railing? : Total 6 Click Score: 12    End of Session   Activity Tolerance: Patient tolerated treatment well;Patient limited  by fatigue Patient left: in chair;with call bell/phone within reach;with chair alarm set Nurse Communication: Mobility status PT Visit Diagnosis: Other abnormalities of gait and mobility (R26.89);Unsteadiness on feet (R26.81);Muscle weakness (generalized) (M62.81)    Time: 3343-5686 PT Time Calculation (min) (ACUTE ONLY): 30 min   Charges:   PT Evaluation $PT Eval Moderate Complexity: 1 Mod PT Treatments $Therapeutic Activity: 23-37 mins        1:44 PM, 01/28/22 Lonell Grandchild, MPT Physical Therapist with [facility] [phone] office [phone] mobile phone

## 2022-01-28 NOTE — Assessment & Plan Note (Signed)
-  Catheter placed about 2 months ago - Patient was following with urology status post hydrocelectomy with penoscrotal edema - He has more swelling and pain - Purulent drainage was noted in the ED - Continue Rocephin - Consult urology - Continue to monitor

## 2022-01-28 NOTE — Anesthesia Preprocedure Evaluation (Addendum)
Anesthesia Evaluation  Patient identified by MRN, date of birth, ID band Patient awake    Reviewed: Allergy & Precautions, H&P , NPO status , Patient's Chart, lab work & pertinent test results, reviewed documented beta blocker date and time   Airway Mallampati: II  TM Distance: >3 FB Neck ROM: Full    Dental  (+) Dental Advisory Given, Missing   Pulmonary former smoker   Pulmonary exam normal breath sounds clear to auscultation       Cardiovascular Exercise Tolerance: Poor hypertension, Pt. on medications and Pt. on home beta blockers + CAD, + Past MI and + Cardiac Stents  Normal cardiovascular exam Rhythm:Regular Rate:Normal     Neuro/Psych negative neurological ROS  negative psych ROS   GI/Hepatic negative GI ROS, Neg liver ROS,,,constipation   Endo/Other  diabetes, Well Controlled, Type 2, Oral Hypoglycemic Agents    Renal/GU Renal disease  negative genitourinary   Musculoskeletal negative musculoskeletal ROS (+)    Abdominal   Peds negative pediatric ROS (+)  Hematology  (+) Blood dyscrasia, anemia   Anesthesia Other Findings   Reproductive/Obstetrics negative OB ROS                             Anesthesia Physical Anesthesia Plan  ASA: 4  Anesthesia Plan: General   Post-op Pain Management: Minimal or no pain anticipated   Induction: Intravenous  PONV Risk Score and Plan: 1 and Propofol infusion  Airway Management Planned: Nasal Cannula and Natural Airway  Additional Equipment:   Intra-op Plan:   Post-operative Plan:   Informed Consent: I have reviewed the patients History and Physical, chart, labs and discussed the procedure including the risks, benefits and alternatives for the proposed anesthesia with the patient or authorized representative who has indicated his/her understanding and acceptance.     Dental advisory given  Plan Discussed with: CRNA and  Surgeon  Anesthesia Plan Comments:        Anesthesia Quick Evaluation

## 2022-01-28 NOTE — Assessment & Plan Note (Signed)
-  Potassium 3.3 - 40 mEq of potassium replaced - Trend in the a.m.

## 2022-01-28 NOTE — Transfer of Care (Signed)
Immediate Anesthesia Transfer of Care Note  Patient: Terrance Miller  Procedure(s) Performed: FLEXIBLE SIGMOIDOSCOPY IMPACTION REMOVAL BIOPSY  Patient Location: PACU  Anesthesia Type:General  Level of Consciousness: awake, alert , oriented, and patient cooperative  Airway & Oxygen Therapy: Patient Spontanous Breathing  Post-op Assessment: Report given to RN, Post -op Vital signs reviewed and stable, and Patient moving all extremities  Post vital signs: Reviewed and stable  Last Vitals:  Vitals Value Taken Time  BP    Temp    Pulse 52 01/28/22 1520  Resp    SpO2 97 % 01/28/22 1520  Vitals shown include unvalidated device data.  Last Pain:  Vitals:   01/28/22 1449  TempSrc:   PainSc: 0-No pain         Complications: No notable events documented.

## 2022-01-28 NOTE — H&P (Signed)
History and Physical    Patient: Terrance Miller BJY:[id number] DOB: 1928-01-23 DOA: 01/27/2022 DOS: the patient was seen and examined on 01/28/2022 PCP: Coral Spikes, DO  Patient coming from: Home  Chief Complaint:  Chief Complaint  Patient presents with   Groin Swelling   HPI: Terrance Miller is a 89 y.o. male with medical history significant of coronary artery disease, hyperlipidemia, hypertension, type 2 diabetes mellitus, presents the ED with a chief complaint of Foley catheter pain and swelling of the penis. Patient was most recently seen by urology on December 5.  He was status post hydrocelectomy with penoscrotal edema.  He had superficial wound dehiscence.  The plan was to continue wound care and continue Foley.  Does not look like he ever followed up with urology after discharge.  Patient reports that he does not know why he is here.  There is no family at bedside.  He is oriented to himself, place, and age, but he did get the year wrong.  Although he tells me he is in the hospital and names any pen, as well as leaving the room he said he is probably can have to go to the hospital today.  Is unclear if he is altered or if this is his baseline.  In any event, patient is not able to provide any history as he does not know why he is here.  He does report that he is not in any pain at this time.  There is no DNR or MOST form at bedside Review of Systems: unable to review all systems due to the inability of the patient to answer questions. Past Medical History:  Diagnosis Date   Acute ST elevation myocardial infarction (STEMI) of inferior wall ([place]) 2004   Coronary artery disease    DES x2 to the RCA 2004, residual disease managed medically   Hyperlipidemia    Hypertension    Type 2 diabetes mellitus ([place])    Past Surgical History:  Procedure Laterality Date   CATARACT EXTRACTION Bilateral    CORONARY ANGIOPLASTY WITH STENT PLACEMENT  09/05/2002   stent RCA, 80% first diagonal, 70-80%  mid-diagonal, 80% mid LAD stenosis   HYDROCELE EXCISION Bilateral 11/20/2021   Procedure: HYDROCELECTOMY ADULT;  Surgeon: Primus Bravo., MD;  Location: [facility];  Service: Urology;  Laterality: Bilateral;   NM MYOCAR PERF WALL MOTION  11/01/2008   Normal   Social History:  reports that he quit smoking about 16 years ago. His smoking use included cigarettes. He smoked an average of .25 packs per day. He has never used smokeless tobacco. He reports that he does not currently use alcohol. He reports that he does not use drugs.  No Known Allergies  Family History  Problem Relation Age of Onset   Hypertension Father    Diabetes Sister     Prior to Admission medications   Medication Sig Start Date End Date Taking? Authorizing Provider  amLODipine (NORVASC) 5 MG tablet Take 1 tablet (5 mg total) by mouth daily. 12/13/21   Dwyane Dee, MD  aspirin EC 81 MG tablet Take 1 tablet (81 mg total) by mouth daily. Swallow whole. 09/13/21   Donato Heinz, MD  carvedilol (COREG) 6.25 MG tablet Take 1 tablet (6.25 mg total) by mouth 2 (two) times daily. 09/13/21   Donato Heinz, MD  cholecalciferol (VITAMIN D) 1000 units tablet Take 1,000 Units by mouth daily.    [provider]  cyanocobalamin (VITAMIN B12) 1000 MCG tablet Take 1,000  mcg by mouth daily.    [provider]  folic acid (FOLVITE) 1 MG tablet Take 1 tablet (1 mg total) by mouth daily. 12/13/21   Dwyane Dee, MD  tamsulosin (FLOMAX) 0.4 MG CAPS capsule Take 0.4 mg by mouth daily.    [provider]  TRAVATAN Z 0.004 % SOLN ophthalmic solution Place 1 drop into both eyes 2 (two) times daily. 04/13/17   [provider]    Physical Exam: Vitals:   01/27/22 2100 01/28/22 0022 01/28/22 0040 01/28/22 0046  BP: (!) 182/83 (!) 141/79  (!) 169/84  Pulse: 63 (!) 58  61  Resp: '14 11  20  '$ Temp: 98.1 F (36.7 C) 97.7 F (36.5 C)  (!) 97.3 F (36.3 C)  TempSrc: Oral Oral  Oral  SpO2: 98%  95%  99%  Weight:   84.7 kg   Height:   '5\' 6"'$  (1.676 m)    1.  General: Patient lying supine in bed,  no acute distress   2. Psychiatric: Alert and oriented to self, place, age, but not to year, or context, mood and behavior normal for situation, pleasant and cooperative with exam   3. Neurologic: Speech and language are normal, face is symmetric, moves all 4 extremities voluntarily, at baseline without acute deficits on limited exam   4. HEENMT:  Head is atraumatic, normocephalic, pupils reactive to light, neck is supple, trachea is midline, mucous membranes are moist   5. Respiratory : Lungs are clear to auscultation bilaterally without wheezing, rhonchi, rales, no cyanosis, no increase in work of breathing or accessory muscle use   6. Cardiovascular : Heart rate normal, rhythm is regular, no murmurs, rubs or gallops, no peripheral edema, peripheral pulses palpated   7. Gastrointestinal:  Abdomen is soft, nondistended, nontender to palpation bowel sounds active, no masses or organomegaly palpated  GU Significant scrotal and penile edema   8. Skin:  Stage II pressure ulcer of scrotum   9.Musculoskeletal:  No acute deformities or trauma, no asymmetry in tone, no peripheral edema, peripheral pulses palpated, no tenderness to palpation in the extremities  Data Reviewed: In the ED Temp 98, heart rate 59-63, respiratory rate 14-18, blood pressure 151/74-182/83, satting at 98% No leukocytosis with white blood cell count of 5.1, hemoglobin 10.6, platelets 163 Chemistry reveals a hypokalemia, and an elevated creatinine that is at patient's baseline CRP is less than 0.5 Lactic acid 2.0 UA is indicative of UTI Blood culture pending Urine culture pending Chest x-ray shows no active disease Patient was started on cefepime and Flagyl LR 500 mL bolus in ED Oxycodone for pain Patient was disimpacted in the ED 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  UTI (urinary tract infection) - UA indicative of UTI - Blood cultures and urine culture pending - Continue Rocephin - No previous positive micro to compare - Continue to monitor  CAD (coronary artery disease) - Continue aspirin and Coreg - Unclear why patient is not on statin  Hypertension - Continue Norvasc and Coreg      Advance Care Planning:   Code Status: Full Code   Consults: Urology and gastroenterology  Family Communication: No family at bedside  Severity of Illness: The appropriate patient status for this patient is OBSERVATION. Observation status is judged to be reasonable and necessary in order to provide the required intensity of service to ensure the patient's safety. The patient's presenting symptoms, physical exam findings, and initial radiographic and laboratory data in the context of their medical condition is felt to place them at decreased risk for further clinical deterioration. Furthermore, it is anticipated that the patient will be medically stable for discharge from the hospital  within 2 midnights of admission.   Author: Rolla Plate, DO 01/28/2022 3:18 AM  For on call review [email or web address].

## 2022-01-28 NOTE — Assessment & Plan Note (Signed)
-  Possible mass identified on CT - Gastroenterology consulted and plans for scope

## 2022-01-28 NOTE — Evaluation (Signed)
Occupational Therapy Evaluation Patient Details Name: Terrance Miller MRN: [id number] DOB: 29-Aug-1928 Today's Date: 01/28/2022   History of Present Illness Terrance Miller is a 89 y.o. male with medical history significant of coronary artery disease, hyperlipidemia, hypertension, type 2 diabetes mellitus, presents the ED with a chief complaint of Foley catheter pain and swelling of the penis.  Patient was most recently seen by urology on December 5.  He was status post hydrocelectomy with penoscrotal edema.  He had superficial wound dehiscence.  The plan was to continue wound care and continue Foley.  Does not look like he ever followed up with urology after discharge.  Patient reports that he does not know why he is here.  There is no family at bedside.  He is oriented to himself, place, and age, but he did get the year wrong.  Although he tells me he is in the hospital and names any pen, as well as leaving the room he said he is probably can have to go to the hospital today.  Is unclear if he is altered or if this is his baseline.  In any event, patient is not able to provide any history as he does not know why he is here.  He does report that he is not in any pain at this time. (Per DO)   Clinical Impression   Pt agreeable to OT and PT co-evaluation. Pt partially oriented. Assist needed to manage scrotal swelling during bed mobility. Pt completed sit to stand with RW well but was fatigued during walking and required max A for the last few feet when returning to the chair. Confirmation of pt's level of support is needed. At this time SNF is recommended unless family reports being able to support pt at current level of function. Pt will benefit from continued OT in the hospital and recommended venue below to increase strength, balance, and endurance for safe ADL's.        Recommendations for follow up therapy are one component of a multi-disciplinary discharge planning process, led by the attending  physician.  Recommendations may be updated based on patient status, additional functional criteria and insurance authorization.   Follow Up Recommendations  Skilled nursing-short term rehab (<3 hours/day)     Assistance Recommended at Discharge Frequent or constant Supervision/Assistance  Patient can return home with the following Two people to help with walking and/or transfers;A lot of help with bathing/dressing/bathroom;Assistance with cooking/housework;Assist for transportation;Help with stairs or ramp for entrance (Two person assist for long distances.)    Functional Status Assessment  Patient has had a recent decline in their functional status and demonstrates the ability to make significant improvements in function in a reasonable and predictable amount of time.  Equipment Recommendations  None recommended by OT    Recommendations for Other Services       Precautions / Restrictions Precautions Precautions: Fall Restrictions Weight Bearing Restrictions: No      Mobility Bed Mobility Overal bed mobility: Needs Assistance Bed Mobility: Rolling, Supine to Sit Rolling: Min assist, Mod assist   Supine to sit: Mod assist, Min assist     General bed mobility comments: Slow labored movement; assist to manage scrotal swelling.    Transfers Overall transfer level: Needs assistance Equipment used: Rolling walker (2 wheels) Transfers: Sit to/from Stand, Bed to chair/wheelchair/BSC Sit to Stand: Min assist, Max assist     Step pivot transfers: Mod assist     General transfer comment: Pt required min G to min A for initial  sit tos stand and first ~10 feet of ambulation. Pt required max A last 8 feet of ambulation due to fatigue when returning to the chair.      Balance Overall balance assessment: Needs assistance Sitting-balance support: Feet supported, Bilateral upper extremity supported Sitting balance-Leahy Scale: Fair Sitting balance - Comments: fair seated at EOB    Standing balance support: During functional activity, Reliant on assistive device for balance, Bilateral upper extremity supported Standing balance-Leahy Scale: Poor Standing balance comment: Started fair but ended poor with RW.                           ADL either performed or assessed with clinical judgement   ADL Overall ADL's : Needs assistance/impaired Eating/Feeding: Set up;Sitting   Grooming: Min guard;Set up;Sitting   Upper Body Bathing: Set up;Min guard;Sitting   Lower Body Bathing: Maximal assistance;Sitting/lateral leans   Upper Body Dressing : Min guard;Set up;Sitting   Lower Body Dressing: Maximal assistance;Sitting/lateral leans   Toilet Transfer: Moderate assistance;Rolling walker (2 wheels);Ambulation;Maximal assistance Toilet Transfer Details (indicate cue type and reason): Simulated via ambulatory transfer to chair which started out more min G assist but requried max A towards the end due to pt fatigue. Toileting- Clothing Manipulation and Hygiene: Maximal assistance;Sit to/from stand Toileting - Clothing Manipulation Details (indicate cue type and reason): Max A for peri care while pt stood at EOB with RW.     Functional mobility during ADLs: Moderate assistance;Maximal assistance;Rolling walker (2 wheels)       Vision Baseline Vision/History: 1 Wears glasses Ability to See in Adequate Light: 0 Adequate Patient Visual Report: No change from baseline Vision Assessment?: Yes Additional Comments: Pt noted to need tactile guidance with RW. Mild acuity deficits with pt correctly naming 3/4 fingers at midline.                Pertinent Vitals/Pain Pain Assessment Pain Assessment: No/denies pain     Hand Dominance Left   Extremity/Trunk Assessment Upper Extremity Assessment Upper Extremity Assessment: Generalized weakness   Lower Extremity Assessment Lower Extremity Assessment: Defer to PT evaluation   Cervical / Trunk  Assessment Cervical / Trunk Assessment: Normal   Communication Communication Communication: HOH   Cognition Arousal/Alertness: Awake/alert Behavior During Therapy: WFL for tasks assessed/performed Overall Cognitive Status: No family/caregiver present to determine baseline cognitive functioning                                 General Comments: Pt oriented to person, place, but not year.                      Home Living Family/patient expects to be discharged to:: Private residence Living Arrangements: Children Available Help at Discharge: Family;Available 24 hours/day Type of Home: House Home Access: Stairs to enter [employer] of Steps: 3 Entrance Stairs-Rails: None Home Layout: Able to live on main level with bedroom/bathroom;Two level Alternate Level Stairs-Number of Steps: 4 Alternate Level Stairs-Rails: Can reach both;Right;Left Bathroom Shower/Tub: Teacher, early years/pre:  (Toilet riser)     Home Equipment: Toilet riser;Cane - single point;Rolling Walker (2 wheels);Rollator (4 wheels)   Additional Comments: Information taken from previous admission.      Prior Functioning/Environment Prior Level of Function : Needs assist       Physical Assist : Mobility (physical);ADLs (physical) Mobility (physical): Transfers;Gait ADLs (physical): IADLs;Dressing;Bathing Mobility Comments:  Pt reports ambulation with RW. ADLs Comments: Assisted with bathing and dressing per pt's reports. Chart review indicates assist for IADL's as well.        OT Problem List: Decreased strength;Decreased activity tolerance;Impaired balance (sitting and/or standing);Impaired vision/perception      OT Treatment/Interventions: Self-care/ADL training;Therapeutic exercise;Therapeutic activities;Patient/family education;Balance training;Cognitive remediation/compensation;Visual/perceptual remediation/compensation    OT Goals(Current goals can be found in  the care plan section) Acute Rehab OT Goals Patient Stated Goal: return home OT Goal Formulation: With patient Time For Goal Achievement: 02/11/22 Potential to Achieve Goals: Good  OT Frequency: Min 2X/week    Co-evaluation PT/OT/SLP Co-Evaluation/Treatment: Yes Reason for Co-Treatment: To address functional/ADL transfers   OT goals addressed during session: ADL's and self-care      AM-PAC OT "6 Clicks" Daily Activity     Outcome Measure Help from another person eating meals?: A Little Help from another person taking care of personal grooming?: A Little Help from another person toileting, which includes using toliet, bedpan, or urinal?: A Lot Help from another person bathing (including washing, rinsing, drying)?: A Lot Help from another person to put on and taking off regular upper body clothing?: A Little Help from another person to put on and taking off regular lower body clothing?: A Lot 6 Click Score: 15   End of Session Equipment Utilized During Treatment: Rolling walker (2 wheels);Gait belt  Activity Tolerance: Patient limited by fatigue Patient left: in chair;with call bell/phone within reach;with chair alarm set  OT Visit Diagnosis: Unsteadiness on feet (R26.81);Other abnormalities of gait and mobility (R26.89);Muscle weakness (generalized) (M62.81)                Time: 8088-1103 OT Time Calculation (min): 24 min Charges:  OT General Charges $OT Visit: 1 Visit OT Evaluation $OT Eval Low Complexity: 1 Low  Terrance Miller OT, MOT   Terrance Miller 01/28/2022, 10:03 AM

## 2022-01-28 NOTE — Anesthesia Postprocedure Evaluation (Signed)
Anesthesia Post Note  Patient: Terrance Miller  Procedure(s) Performed: FLEXIBLE SIGMOIDOSCOPY IMPACTION REMOVAL BIOPSY  Patient location during evaluation: PACU Anesthesia Type: General Level of consciousness: awake and alert Pain management: pain level controlled Vital Signs Assessment: post-procedure vital signs reviewed and stable Respiratory status: spontaneous breathing, nonlabored ventilation and respiratory function stable Cardiovascular status: blood pressure returned to baseline and stable Postop Assessment: no apparent nausea or vomiting Anesthetic complications: no  No notable events documented.   Last Vitals:  Vitals:   01/28/22 1530 01/28/22 1557  BP: 124/63 (!) 120/44  Pulse: (!) 49 (!) 51  Resp: 15 16  Temp:  36.6 C  SpO2: 98% 100%    Last Pain:  Vitals:   01/28/22 1557  TempSrc: Oral  PainSc:                  Torri Langston C Jarissa Sheriff

## 2022-01-28 NOTE — Procedures (Signed)
01/27/2022 - 01/28/2022  3:23 PM  PATIENT:  Terrance Miller  89 y.o. male  PRE-OPERATIVE DIAGNOSIS:  STERCORAL COLITIS, ABNORMAL CT OF COLON  POST-OPERATIVE DIAGNOSIS:  fecal disimpaction  PROCEDURE:  FECAL DISIMPACTION  SURGEON:  Surgeon(s) and Role:    * Harvel Quale, MD - Primary  Patient was taken for flexible sigmoidoscopy today for evaluation of possible mass in the rectosigmoid area.  Upon initial rectal exam, the patient was found to have large amount of stool which was confirmed with endoscopy.  Due to these I proceeded to perform manual disimpaction with removal of large amount of solid hard stool successfully.  Maylon Peppers, MD Gastroenterology and Hepatology [facility]

## 2022-01-28 NOTE — Plan of Care (Signed)
  Problem: Acute Rehab PT Goals(only PT should resolve) Goal: Pt Will Go Supine/Side To Sit Outcome: Progressing Flowsheets (Taken 01/28/2022 1345) Pt will go Supine/Side to Sit:  with min guard assist  with minimal assist Goal: Patient Will Transfer Sit To/From Stand Outcome: Progressing Flowsheets (Taken 01/28/2022 1345) Patient will transfer sit to/from stand:  with minimal assist  with min guard assist Goal: Pt Will Transfer Bed To Chair/Chair To Bed Outcome: Progressing Flowsheets (Taken 01/28/2022 1345) Pt will Transfer Bed to Chair/Chair to Bed:  min guard assist  with min assist Goal: Pt Will Ambulate Outcome: Progressing Flowsheets (Taken 01/28/2022 1345) Pt will Ambulate:  50 feet  with minimal assist  with rolling walker   1:46 PM, 01/28/22 Lonell Grandchild, MPT Physical Therapist with [facility] [phone] office [phone] mobile phone

## 2022-01-28 NOTE — Progress Notes (Signed)
Pt has been resting since arrival to floor.  Denies pain if not lying on his back.  Pt scrotal wound cleansed and mepilex applied, wound consult order in place.  Foley patent.  Pt oriented to self but not able to state place or year.

## 2022-01-28 NOTE — Op Note (Signed)
[facility] Patient Name: Terrance Miller Procedure Date: 01/28/2022 2:30 PM MRN: [id number] Date of Birth: [birth year] Attending MD: Maylon Peppers , , [id number] CSN: [id number] Age: 89 Admit Type: Inpatient Procedure:                Flexible Sigmoidoscopy Indications:              Abnormal CT of the GI tract Providers:                Maylon Peppers, Lambert Mody, Crystal Page,                            Edith Endave Risa Grill, Technician, Aram Candela Referring MD:              Medicines:                Monitored Anesthesia Care Complications:            No immediate complications. Estimated Blood Loss:     Estimated blood loss: none. Procedure:                Pre-Anesthesia Assessment:                           - Prior to the procedure, a History and Physical                            was performed, and patient medications, allergies                            and sensitivities were reviewed. The patient's                            tolerance of previous anesthesia was reviewed.                           - The risks and benefits of the procedure and the                            sedation options and risks were discussed with the                            patient. All questions were answered and informed                            consent was obtained.                           - ASA Grade Assessment: III - A patient with severe                            systemic disease.                           After obtaining informed consent, the scope was                            passed under  direct vision. The PCF-HQ190L                            ([id number]) scope was introduced through the anus and                            advanced to the the descending colon. The flexible                            sigmoidoscopy was technically difficult and complex                            due to poor bowel prep. The patient tolerated the                            procedure  well. Scope In: 2:58:08 PM Scope Out: 3:14:51 PM Total Procedure Duration: 0 hours 16 minutes 43 seconds  Findings:      The perianal and digital rectal examinations were normal.      A large amount of solid stool was found in the rectum, in the       recto-sigmoid colon and in the sigmoid colon, precluding visualization.       Due to this, I proceeded to perform a large manual disimpaction of the       stool.      Once disimpaction was performed, I did a thorough inspection of the       proximal rectum, recto-sigmoid colon and sigmoid colon , which appeared       normal.      Nonbleeding ulcerated focally linear mucosa was present in the distal       rectum, possibly a result of previous disimpaction attempts. There was a       stripe of tissue adhered to this. Biopsies were taken from the stripe       with a cold forceps for histology.      Non-bleeding internal hemorrhoids were found during retroflexion. The       hemorrhoids were small. Impression:               - Stool in the rectum, in the recto-sigmoid colon                            and in the sigmoid colon.                           - The sigmoid colon, recto-sigmoid colon and                            proximal rectum are normal.                           - Mucosal ulceration in the distal rectum with                            adhered tissue. Biopsied.                           - Non-bleeding internal  hemorrhoids. Moderate Sedation:      Per Anesthesia Care Recommendation:           - Return patient to hospital ward for ongoing care.                           - Resume previous diet.                           - Start Miralax BID.                           - Await pathology results. Procedure Code(s):        --- Professional ---                           [id number], Sigmoidoscopy, flexible; with biopsy, single                            or multiple Diagnosis Code(s):        --- Professional ---                            K64.8, Other hemorrhoids                           K62.6, Ulcer of anus and rectum                           R93.3, Abnormal findings on diagnostic imaging of                            other parts of digestive tract CPT copyright 2022 American Medical Association. All rights reserved. The codes documented in this report are preliminary and upon coder review may  be revised to meet current compliance requirements. Maylon Peppers, MD Maylon Peppers,  01/28/2022 3:29:36 PM This report has been signed electronically. Number of Addenda: 0

## 2022-01-28 NOTE — Brief Op Note (Signed)
01/27/2022 - 01/28/2022  3:24 PM  PATIENT:  Terrance Miller  89 y.o. male  PRE-OPERATIVE DIAGNOSIS:  STERCORAL COLITIS, ABNORMAL CT OF COLON  POST-OPERATIVE DIAGNOSIS:  fecal disimpaction; abnormal tissue  PROCEDURE:  Procedure(s): FLEXIBLE SIGMOIDOSCOPY (N/A) IMPACTION REMOVAL BIOPSY  SURGEON:  Surgeon(s) and Role:    * Harvel Quale, MD - Primary  Patient underwent flex sigmoidoscopy under propofol sedation.  Tolerated the procedure adequately.   FINDINGS: -A large amount of solid stool was found in the rectum, in the recto-sigmoid colon and in the sigmoid colon, precluding visualization. Due to this, I proceeded to perform a large manual disimpaction of the stool. Once disimpaction was performed, I did a thorough inspection of the proximal rectum, recto-sigmoid colon and sigmoid colon , which appeared normal.  -Nonbleeding ulcerated focally linear mucosa was present in the distal rectum, possibly a result of previous disimpaction attempts. There was a stripe of tissue adhered to this. Biopsies were taken from the stripe with a cold forceps for histology.  -Non-bleeding internal hemorrhoids were found during retroflexion.  The hemorrhoids were small.   RECOMMENDATIONS - Return patient to hospital ward for ongoing care.  - Resume previous diet.  - Start Miralax BID. - Await pathology results.   Maylon Peppers, MD Gastroenterology and Hepatology [facility]

## 2022-01-28 NOTE — Assessment & Plan Note (Signed)
-  Continue aspirin and Coreg - Unclear why patient is not on statin

## 2022-01-28 NOTE — Plan of Care (Signed)
  Problem: Acute Rehab OT Goals (only OT should resolve) Goal: Pt. Will Perform Grooming Flowsheets (Taken 01/28/2022 1008) Pt Will Perform Grooming:  with supervision  standing Goal: Pt. Will Perform Lower Body Bathing Flowsheets (Taken 01/28/2022 1008) Pt Will Perform Lower Body Bathing:  with mod assist  sitting/lateral leans Goal: Pt. Will Perform Lower Body Dressing Flowsheets (Taken 01/28/2022 1008) Pt Will Perform Lower Body Dressing:  with mod assist  with min assist  sitting/lateral leans Goal: Pt. Will Transfer To Toilet Flowsheets (Taken 01/28/2022 1008) Pt Will Transfer to Toilet:  with modified independence  ambulating Goal: Pt. Will Perform Toileting-Clothing Manipulation Flowsheets (Taken 01/28/2022 1008) Pt Will Perform Toileting - Clothing Manipulation and hygiene:  with min assist  sitting/lateral leans Goal: Pt/Caregiver Will Perform Home Exercise Program Flowsheets (Taken 01/28/2022 1008) Pt/caregiver will Perform Home Exercise Program:  Increased strength  Both right and left upper extremity  Independently  Meg Niemeier OT, MOT

## 2022-01-28 NOTE — Assessment & Plan Note (Signed)
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

## 2022-01-28 NOTE — Progress Notes (Signed)
Pharmacy Antibiotic Note  Terrance Miller is a 89 y.o. male admitted on 01/27/2022 with UTI and wound .  Pharmacy has been consulted for vanc/cefepime dosing.  Pt with recent bilateral hydrocelectomy with wound issues. He is catheter dependence with purulent drainage. Vanc/cefepime ordered. Messaged to possible use linezolid to prevent worsening CKD.  Scr 1.11  Plan: Cefepime 2g IV q24 Vanc 1g IV x1 then '750mg'$  IV q24>>AUC 478, scr 1.11 Levels if needed  Height: '5\' 6"'$  (167.6 cm) Weight: 84.7 kg (186 lb 11.7 oz) IBW/kg (Calculated) : 63.8  Temp (24hrs), Avg:97.7 F (36.5 C), Min:97.3 F (36.3 C), Max:98.1 F (36.7 C)  Recent Labs  Lab 01/27/22 1755 01/27/22 1900 01/27/22 1910 01/27/22 2101 01/28/22 0454  WBC 5.1  --   --   --  5.4  CREATININE 1.30*  --  1.20  --  1.11  LATICACIDVEN  --  2.0*  --  1.9  --     Estimated Creatinine Clearance: 44.3 mL/min (by C-G formula based on SCr of 1.11 mg/dL).    No Known Allergies  Antimicrobials this admission: 1/23 vanc>> 1/23 cefepime>>  Dose adjustments this admission:   Microbiology results: 1/22 blood>> 1/22 urine>>  Onnie Boer, PharmD, [place], AAHIVP, CPP Infectious Disease Pharmacist 01/28/2022 7:50 PM

## 2022-01-28 NOTE — Telephone Encounter (Signed)
Spoke with the family member and the patient does not want to have a bone marrow biopsy.  He is also currently in the hospital at this time in [facility].

## 2022-01-28 NOTE — Telephone Encounter (Signed)
-----  Message from Harriett Rush, [place] sent at 01/27/2022  7:47 PM EST ----- I had a phone visit with patient and his niece on 01/20/2022 and discussed with them that he needs bone marrow biopsy.  Due to his age, patient and family wanted some time to think about it.  Can you please call the patient's niece/main family member/decision-maker Jackelyn Hoehn) at [phone] to see whether or not they have decided to pursue bone marrow biopsy.  Further plan TBD based on their decision.  ----- Message ----- From: Konrad Saha Sent: 01/27/2022   8:00 AM EST To: Harriett Rush, PA-C  Phone visit with patient on 01/20/2022, discussed need for bone marrow biopsy, but patient/family would like time to think about it.  Please call patient's niece Jackelyn Hoehn, a.k.a. main family member/decision-maker) at [phone] to see whether or not they have decided to pursue bone marrow biopsy.  Further plan TBD based on their decision.

## 2022-01-28 NOTE — Consult Note (Signed)
WOC Nurse Consult Note: Reason for Consult: scrotal wound  Present during last admission as well, patient has had significant scrotal edema x 2 months or greater  Wound type: Stage 2 Pressure Injury Pressure Injury POA: Yes Measurement: 4-5cm x 3cm x 0.1cm  Wound bed:100% pink, moist Drainage (amount, consistency, odor) scant, serosanguinous  Periwound: intact, edema Dressing procedure/placement/frequency: Wash wound with soap and water, pat dry Apply single layer of xeroform gauze, top with foam Change every other day.   Limit exposure to pressure if possible  Discussed POC with patient and bedside nurse.  Re consult if needed, will not follow at this time. Thanks  Jaiyden Laur [employer], RN,CWOCN, CNS, [place] [phone])

## 2022-01-28 NOTE — TOC Initial Note (Signed)
Transition of Care [facility]) - Initial/Assessment Note    Patient Details  Name: Terrance Miller MRN: [id number] Date of Birth: 1928-05-31  Transition of Care [facility]) CM/SW Contact:    Shade Flood, LCSW Phone Number: 01/28/2022, 1:02 PM  Clinical Narrative:                  Pt admitted from home. PT/OT recommending SNF rehab at dc. Pt only oriented to self. Spoke with pt's daughter to assess and review dc planning. Per dtr, pt resides with her and her husband. Pt has a walker, cane, w/c, adjustable bed, BSC, tub bench for DME. Dtr states pt was at [facility] from 12/7-1/5. Offered SNF referrals and dtr states that they prefer to take pt home with [facility]. Dtr states that pt was not receiving [place] upon dc from [facility] as she and her husband both had COVID and they were waiting to recover and clean the home. Will call [facility] to see if they had referred pt to any [facility] agency upon dc and will follow up.   Expected Discharge Plan: [place] Barriers to Discharge: Continued Medical Work up   Patient Goals and CMS Choice Patient states their goals for this hospitalization and ongoing recovery are:: go home CMS Medicare.gov Compare Post Acute Care list provided to:: Patient Represenative (must comment) Choice offered to / list presented to : Adult Children      Expected Discharge Plan and Services In-house Referral: Clinical Social Work   Post Acute Care Choice: [place] arrangements for the past 2 months: [place]                                      Prior Living Arrangements/Services Living arrangements for the past 2 months: Single Family Home Lives with:: Adult Children Patient language and need for interpreter reviewed:: Yes Do you feel safe going back to the place where you live?: Yes      Need for Family Participation in Patient Care: Yes (Comment) Care giver support system in place?: Yes (comment) Current home services:  DME Criminal Activity/Legal Involvement Pertinent to Current Situation/Hospitalization: No - Comment as needed  Activities of Daily Living Home Assistive Devices/Equipment: Hospital bed, Wheelchair ADL Screening (condition at time of admission) Patient's cognitive ability adequate to safely complete daily activities?: No Is the patient deaf or have difficulty hearing?: Yes Does the patient have difficulty seeing, even when wearing glasses/contacts?: No Does the patient have difficulty concentrating, remembering, or making decisions?: Yes Patient able to express need for assistance with ADLs?: Yes Does the patient have difficulty dressing or bathing?: Yes Independently performs ADLs?: No Communication: Independent Walks in Home: Dependent Is this a change from baseline?: Pre-admission baseline Does the patient have difficulty walking or climbing stairs?: Yes Weakness of Legs: Both Weakness of Arms/Hands: Both  Permission Sought/Granted Permission sought to share information with : Facility Art therapist granted to share information with : Yes, Verbal Permission Granted     Permission granted to share info w AGENCY: [place]        Emotional Assessment       Orientation: : Oriented to Self Alcohol / Substance Use: Not Applicable Psych Involvement: No (comment)  Admission diagnosis:  Stercoral colitis [K52.89] Patient Active Problem List   Diagnosis Date Noted   Pressure injury of skin 01/28/2022   Colonic mass 01/28/2022  Hypokalemia 01/27/2022   Hypomagnesemia 34/74/2595   Complication of Foley catheter ([place]) 01/27/2022   Stercoral colitis 01/27/2022   Acute urinary retention 63/87/5643   Acute metabolic encephalopathy 32/95/1884   Stage 3b chronic kidney disease ([place]) 11/05/2021   MGUS (monoclonal gammopathy of unknown significance) 11/05/2021   Rash 11/05/2021   UTI (urinary tract infection) 07/27/2021   Macrocytic anemia 05/31/2021   History of MI  (myocardial infarction) 04/29/2021   Hydrocele 10/26/2020   Hypertension 09/28/2013   Hyperlipidemia LDL goal <70 09/28/2013   CAD (coronary artery disease) 09/28/2013   PCP:  Coral Spikes, DO Pharmacy:   [facility], [address] [place] [address] Phone: [phone] Fax: [phone]  [facility] [place], [address] [place] [address] Phone: [phone] Fax: [phone]     Social Determinants of Health (SDOH) Social History: SDOH Screenings   Food Insecurity: No Food Insecurity (12/01/2021)  Housing: Low Risk  (12/01/2021)  Transportation Needs: No Transportation Needs (12/01/2021)  Utilities: Not At Risk (12/01/2021)  Alcohol Screen: Low Risk  (07/16/2021)  Depression (PHQ2-9): Low Risk  (07/16/2021)  Financial Resource Strain: Low Risk  (07/16/2021)  Physical Activity: Sufficiently Active (07/16/2021)  Social Connections: Moderately Isolated (07/16/2021)  Stress: No Stress Concern Present (07/16/2021)  Tobacco Use: Medium Risk (01/27/2022)   SDOH Interventions:     Readmission Risk Interventions     No data to display

## 2022-01-28 NOTE — Progress Notes (Addendum)
[facility] Progress Note  Patient: Terrance Miller     UTM:[id number]  DOA: 01/27/2022   PCP: Coral Spikes, DO       Brief hospital course: This is a 89 year old male who was recently diagnosed MGUS, diabetes mellitus type 2, chronic foley for 2 mo after b/l hydrocelectomy, hypertension who is hard of hearing     He was brought into the hospital from home for swelling "around the Foley catheter" with pain.   CT abd pelvis> noted to have severe constipation and attempted to be disimpacted.  Also noted to have rectosigmoid mass vs colonic narrowing upstream from the rectal impaction . Foley catheter had purulent discharge. Scrotum still enlarge and ulcer still present on bottom of scrotum. Lactic acid noted to be 2.0, K3.3.  Subjective:  Sitting up in a chair.  He states he feels well. Assessment and Plan: Principal Problem:   Stercoral colitis with rectal ulcer/ acute on chronic constipation - FOB neg - Rectal tube in place when I evaluated him this morning - GI has performed a colonoscopy and had to manually disimpact him - Noted to have a rectal ulcer possibly secondary to stool-biopsies done - Nonbleeding internal hemorrhoids also noted - Plan is to start twice daily MiraLAX, resume diet and follow-up on biopsies - stop Ceftriaxone and Metronidazole   Active Problems:   Chronic foley with increasing scrotal edema and pain and chronic scrotal ulcer - 11/15 bilateral hydrocelectomy, scrotal edema was severe and then foley placed and recommended to stay in place until edema resolved- he did not f/u with urology and has had the same foley for 2 months - still has severe edema -According to the ED doctor and the admitting doctor there was a significant amount of purulent discharge around the Foley catheter when he was admitted - Cefepime x 1 dose given - Urine was sent for culture and ceftriaxone was started -Urology consult has been requested by admitting doctor and catheter  exchange done - elevate scrotum - cont Flomax - cont wound care for ulcer by nursing staff - empirically treat for a CAUTI with Cefepime and Vanc  Lactic acidosis - LA 2.0> 1.9  Hypokalemia - 3,3> 3.6 - Continue to replace   HTN - Amlodipine. Carvedilol  CKD 3a - follow  Macrocytic anemia, recently diagnosed MGUS - Folate 5.9  & Vit B12 1,1176 in 56/81 - cont outpt folic acid   - he has declined a bone marrow biopsy.  Deconditioning, dementia and severely HOH   PT recommending SNF again- he ws discharged from a SNF 2 wks ago and clearly has not done well at home- I have consulted TOC and palliative care     Code Status: Full Code Consultants: GI Level of Care: Level of care: Med-Surg Total time on patient care: 35 DVT prophylaxis:  heparin injection 5,000 Units Start: 01/28/22 1000 SCDs Start: 01/28/22 0052     Objective:   Vitals:   01/28/22 1405 01/28/22 1520 01/28/22 1530 01/28/22 1557  BP: (!) 140/85 101/60 124/63 (!) 120/44  Pulse: (!) 54 (!) 52 (!) 49 (!) 51  Resp: '16 14 15 16  '$ Temp: 97.7 F (36.5 C) 97.8 F (36.6 C)  97.8 F (36.6 C)  TempSrc: Oral   Oral  SpO2: 100% 97% 98% 100%  Weight:      Height:       Filed Weights   01/27/22 1730 01/28/22 0040  Weight: 74.8 kg 84.7 kg   Exam: General exam: Appears comfortable -  sitting in chair- oriented to place and person HEENT: oral mucosa moist- HOH Respiratory system: Clear to auscultation.  Cardiovascular system: S1 & S2 heard  Gastrointestinal system: Abdomen soft, non-tender, nondistended. Normal bowel sounds  - rectal tube present with liquid stool GU- severely enlarged scrotum- foley present Extremities: No cyanosis, clubbing or edema Psychiatry:  Mood & affect appropriate.      CBC: Recent Labs  Lab 01/27/22 1755 01/27/22 1910 01/28/22 0454  WBC 5.1  --  5.4  NEUTROABS 2.9  --  3.0  HGB 10.6* 12.6* 8.8*  HCT 33.1* 37.0* 26.3*  MCV 105.1*  --  104.0*  PLT 163  --  102   Basic  Metabolic Panel: Recent Labs  Lab 01/27/22 1755 01/27/22 1910 01/28/22 0454  NA 138 144 138  K 3.3* 3.4* 3.6  CL 110 109 113*  CO2 22  --  22  GLUCOSE 143* 135* 98  BUN '13 11 11  '$ CREATININE 1.30* 1.20 1.11  CALCIUM 8.2*  --  7.8*  MG 2.0  --  1.8   GFR: Estimated Creatinine Clearance: 44.3 mL/min (by C-G formula based on SCr of 1.11 mg/dL).  Scheduled Meds:  amLODipine  5 mg Oral Daily   aspirin EC  81 mg Oral Daily   carvedilol  6.25 mg Oral BID   Chlorhexidine Gluconate Cloth  6 each Topical Daily   heparin  5,000 Units Subcutaneous Q8H   polyethylene glycol  17 g Oral BID   tamsulosin  0.4 mg Oral Daily   Continuous Infusions:  sodium chloride 75 mL/hr at 01/28/22 1557   cefTRIAXone (ROCEPHIN)  IV Stopped (01/28/22 0240)   metronidazole 500 mg (01/28/22 0949)   Imaging and lab data was personally reviewed CT ABDOMEN PELVIS W CONTRAST  Result Date: 01/27/2022 CLINICAL DATA:  Abdomen pain EXAM: CT ABDOMEN AND PELVIS WITH CONTRAST TECHNIQUE: Multidetector CT imaging of the abdomen and pelvis was performed using the standard protocol following bolus administration of intravenous contrast. RADIATION DOSE REDUCTION: This exam was performed according to the departmental dose-optimization program which includes automated exposure control, adjustment of the mA and/or kV according to patient size and/or use of iterative reconstruction technique. CONTRAST:  158m OMNIPAQUE IOHEXOL 300 MG/ML  SOLN COMPARISON:  CT 07/27/2021 FINDINGS: Lower chest: Lung bases demonstrate no acute airspace disease. Mild cardiomegaly. Coronary vascular calcification. Hepatobiliary: No focal liver abnormality is seen. No gallstones, gallbladder wall thickening, or biliary dilatation. Pancreas: Unremarkable. No pancreatic ductal dilatation or surrounding inflammatory changes. Spleen: Normal in size without focal abnormality. Adrenals/Urinary Tract: Adrenal glands are within normal limits. Kidneys show no  hydronephrosis. Bilateral renal cysts, no imaging follow-up is recommended. The bladder is decompressed by Foley catheter. Bladder is displaced anteriorly. Stomach/Bowel: Stomach is nonenlarged. No dilated small bowel. Large volume of stool throughout the colon suggestive of constipation. Rectal distension by impacted feces. Perirectal and presacral edema and mild soft tissue stranding. Possible irregular focus of colon narrowing upstream to the impacted feces at the rectosigmoid colon, series 2, image 63 and coronal series 5, image 57. Negative appendix Vascular/Lymphatic: Moderate aortic atherosclerosis. No aneurysm. No suspicious lymph nodes. Reproductive: Negative for mass. Other: Negative for pelvic effusion or free air. Fat containing inguinal hernias. Hazy appearance of central mesentery without mass or adenopathy as before Musculoskeletal: No acute osseous abnormality. IMPRESSION: 1. Large volume of stool throughout the colon suggestive of constipation. Rectal distension by impacted feces with perirectal and presacral edema and soft tissue stranding, question stercoral colitis. Suspected area of colonic  narrowing/possible mass at the rectosigmoid colon just upstream to the rectal impaction, this may be further assessed with colonoscopy. 2. No other acute intra-abdominal or pelvic abnormality is seen. 3. Aortic atherosclerosis. Aortic Atherosclerosis (ICD10-I70.0). Electronically Signed   By: Donavan Foil M.D.   On: 01/27/2022 20:38   DG Chest Port 1 View  Result Date: 01/27/2022 CLINICAL DATA:  Questionable sepsis EXAM: PORTABLE CHEST 1 VIEW COMPARISON:  Chest x-ray 11/30/2021 FINDINGS: The heart size and mediastinal contours are within normal limits. Both lungs are clear. The visualized skeletal structures are unremarkable. Punctate metallic foreign bodies are seen in the left chest wall, unchanged. IMPRESSION: No active disease. Electronically Signed   By: Ronney Asters M.D.   On: 01/27/2022 18:28     LOS: 0 days   Author: Debbe Odea  01/28/2022 4:26 PM  To contact [facility]>   Check the care team in [facility] and look for the attending/consulting [place] provider listed  Log into [email or web address] and use [place]'s universal password   Go to> "[facility]"  and find provider  If you still have difficulty reaching the provider, please page the [facility] (Director on Call) for the Hospitalists listed on amion

## 2022-01-28 NOTE — Assessment & Plan Note (Signed)
-  UA indicative of UTI - Blood cultures and urine culture pending - Continue Rocephin - No previous positive micro to compare - Continue to monitor
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
## 2022-01-28 NOTE — Consult Note (Signed)
H&P  Consulting physician: Dr. Wynelle Cleveland  Chief Complaint: Catheter issues  History of Present Illness: 89 year old male, not very conversive, apparently admitted from the emergency room for further urologic management.  He had bilateral hydrocelectomy by Dr. Felipa Eth approximately 2 months ago.  He had post operative wound opening.  He has had significant edema of his scrotum and is getting wound care to his scrotal wound which is healing by secondary intention.  He has had catheter dependence due to his bedbound status as well as retention.  Catheter placement has been difficult in the past.  Urologic consultation is requested for catheter change which has not been done in 2 months as well as purulent drainage around his catheter.  He is unable to answer any questions.  Past Medical History:  Diagnosis Date   Acute ST elevation myocardial infarction (STEMI) of inferior wall ([place]) 2004   Coronary artery disease    DES x2 to the RCA 2004, residual disease managed medically   Hyperlipidemia    Hypertension    Type 2 diabetes mellitus ([place])     Past Surgical History:  Procedure Laterality Date   CATARACT EXTRACTION Bilateral    CORONARY ANGIOPLASTY WITH STENT PLACEMENT  09/05/2002   stent RCA, 80% first diagonal, 70-80% mid-diagonal, 80% mid LAD stenosis   HYDROCELE EXCISION Bilateral 11/20/2021   Procedure: HYDROCELECTOMY ADULT;  Surgeon: Primus Bravo., MD;  Location: [facility];  Service: Urology;  Laterality: Bilateral;   NM MYOCAR PERF WALL MOTION  11/01/2008   Normal    Home Medications:    Allergies: No Known Allergies  Family History  Problem Relation Age of Onset   Hypertension Father    Diabetes Sister     Social History:  reports that he quit smoking about 16 years ago. His smoking use included cigarettes. He smoked an average of .25 packs per day. He has never used smokeless tobacco. He reports that he does not currently use alcohol. He reports that he does not  use drugs.  ROS: A complete review of systems was performed.  All systems are negative except for pertinent findings as noted.  Physical Exam:  Vital signs in last 24 hours: BP (!) 120/44 (BP Location: Left Arm)   Pulse (!) 51   Temp 97.8 F (36.6 C) (Oral)   Resp 16   Ht '5\' 6"'$  (1.676 m)   Wt 84.7 kg   SpO2 100%   BMI 30.14 kg/m  Constitutional: Lethargic, hard to understand Cardiovascular: Regular rate  Respiratory: Normal respiratory effort Genitourinary: Phallus is uncircumcised.  Swollen foreskin limits evaluation of his glans.  Catheter is in place with some purulent drainage around it.  Urine is dark brown.  There is a dressing over his posterior scrotum.  There is no crepitus.  Appropriate edema. Lymphatic: No lymphadenopathy Neurologic: Grossly intact, no focal deficits Psychiatric: Normal mood and affect  I have reviewed prior pt notes  I have reviewed notes from referring/previous physicians   Complex catheter change was performed.  Old catheter was removed over top of the guidewire.  That was a 82 [place] council tip catheter.  After sterile prep and drape, 53 [place] council tip catheter was easily negotiated into the bladder.  Balloon filled with 10 cc of water and hooked into bag.   Impression/Assessment:  Difficult catheter placement-changed today  Probable UTI, will treat for hospital acquired UTI  History of hydrocelectomy, bilateral with dehiscence of wound.  Getting wound care  Plan:  I would recommend consultation  with wound care service while here  No further treatment by urology necessary at this time  It would be best served for him to come to our office for catheter change in 1 month.  I was supposed to see him last month but he did not show

## 2022-01-29 ENCOUNTER — Encounter (HOSPITAL_COMMUNITY): Payer: Self-pay | Admitting: Internal Medicine

## 2022-01-29 DIAGNOSIS — N3000 Acute cystitis without hematuria: Secondary | ICD-10-CM | POA: Diagnosis not present

## 2022-01-29 DIAGNOSIS — K5641 Fecal impaction: Secondary | ICD-10-CM | POA: Diagnosis not present

## 2022-01-29 DIAGNOSIS — Z7189 Other specified counseling: Secondary | ICD-10-CM | POA: Diagnosis not present

## 2022-01-29 DIAGNOSIS — K5289 Other specified noninfective gastroenteritis and colitis: Secondary | ICD-10-CM | POA: Diagnosis not present

## 2022-01-29 DIAGNOSIS — Z515 Encounter for palliative care: Secondary | ICD-10-CM

## 2022-01-29 LAB — BASIC METABOLIC PANEL
Anion gap: 4 — ABNORMAL LOW (ref 5–15)
BUN: 12 mg/dL (ref 8–23)
CO2: 21 mmol/L — ABNORMAL LOW (ref 22–32)
Calcium: 8 mg/dL — ABNORMAL LOW (ref 8.9–10.3)
Chloride: 114 mmol/L — ABNORMAL HIGH (ref 98–111)
Creatinine, Ser: 1.08 mg/dL (ref 0.61–1.24)
GFR, Estimated: >60 mL/min (ref >60)
Glucose, Bld: 81 mg/dL (ref 70–99)
Potassium: 4.5 mmol/L (ref 3.5–5.1)
Sodium: 139 mmol/L (ref 135–145)

## 2022-01-29 LAB — CBC
HCT: 26.8 % — ABNORMAL LOW (ref 39.0–52.0)
Hemoglobin: 8.8 g/dL — ABNORMAL LOW (ref 13.0–17.0)
MCH: 34.4 pg — ABNORMAL HIGH (ref 26.0–34.0)
MCHC: 32.8 g/dL (ref 30.0–36.0)
MCV: 104.7 fL — ABNORMAL HIGH (ref 80.0–100.0)
Platelets: 147 10*3/uL — ABNORMAL LOW (ref 150–400)
RBC: 2.56 MIL/uL — ABNORMAL LOW (ref 4.22–5.81)
RDW: 21.5 % — ABNORMAL HIGH (ref 11.5–15.5)
WBC: 4.2 10*3/uL (ref 4.0–10.5)
nRBC: 1.2 % — ABNORMAL HIGH (ref 0.0–0.2)

## 2022-01-29 MED ORDER — HYDRALAZINE HCL 20 MG/ML IJ SOLN
5.0000 mg | Freq: Once | INTRAMUSCULAR | Status: AC
  Administered 2022-01-29: 5 mg via INTRAVENOUS
  Filled 2022-01-29: qty 1

## 2022-01-29 MED ORDER — BISACODYL 10 MG RE SUPP
10.0000 mg | Freq: Every day | RECTAL | Status: DC
  Administered 2022-01-29 – 2022-01-31 (×3): 10 mg via RECTAL
  Filled 2022-01-29 (×3): qty 1

## 2022-01-29 MED ORDER — SENNOSIDES-DOCUSATE SODIUM 8.6-50 MG PO TABS
2.0000 | ORAL_TABLET | Freq: Every day | ORAL | Status: DC
  Administered 2022-01-29 – 2022-01-30 (×2): 2 via ORAL
  Filled 2022-01-29 (×2): qty 2

## 2022-01-29 NOTE — TOC Progression Note (Signed)
Transition of Care [facility]) - Progression Note    Patient Details  Name: Natalie Leclaire MRN: [id number] Date of Birth: 06/12/1928  Transition of Care [facility]) CM/SW Contact  Ihor Gully, LCSW Phone Number: 01/29/2022, 2:37 PM  Clinical Narrative:    Marjory Lies from [place] accepts for [place].   Expected Discharge Plan: [place] Barriers to Discharge: No Barriers Identified  Expected Discharge Plan and Services In-house Referral: Clinical Social Work   Post Acute Care Choice: [place] arrangements for the past 2 months: [place]: PT [place]: [place] Date [place]: 01/29/22 Time [place]: [place] Representative spoke with at [place]: [place] Determinants of Health ([place]) Interventions [place]: No Food Insecurity (12/01/2021)  Housing: Low Risk  (12/01/2021)  Transportation Needs: No Transportation Needs (12/01/2021)  Utilities: Not At Risk (12/01/2021)  Alcohol Screen: Low Risk  (07/16/2021)  Depression (PHQ2-9): Low Risk  (07/16/2021)  Financial Resource Strain: Low Risk  (07/16/2021)  Physical Activity: Sufficiently Active (07/16/2021)  Social Connections: Moderately Isolated (07/16/2021)  Stress: No Stress Concern Present (07/16/2021)  Tobacco Use: Medium Risk (01/29/2022)    Readmission Risk Interventions     No data to display

## 2022-01-29 NOTE — Progress Notes (Signed)
1x hydralazine this am for elevated BP with effective results.  Wound cares performed to scrotal wound.  Pt denies pain.  Foley patent.  Repositioned for comfort.

## 2022-01-29 NOTE — Progress Notes (Signed)
Provider notified of elevated BP at 2045.  Plans to give scheduled coreg and recheck.  Provider ok with this intervention at this time.  Pt later rechecked, BP decreased, provider notified, no new orders.  Pt has also had a HGB decrease from 12.6 to 8.8 in under 12 hours, no active s/s of bleeding, occult stool was negative yesterday.  Reviewed with provider prior to administering heparin.  Orders to hold heparin at this time.  SCDs applied, pt repositioned for comfort.

## 2022-01-29 NOTE — Progress Notes (Signed)
Pt has been alert to self and has been able to take medications. Pt has been ambulated to the chair and was able to sit up for a few hours. PT was a max two assist to ambulate to and from the chair. Pt has had two Bms on this shift. Vitals have remained stable. Pt denies any pain or discomfort.
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
## 2022-01-29 NOTE — Progress Notes (Signed)
[facility] Progress Note  Patient: Terrance Miller     UUV:[id number]  DOA: 01/27/2022   PCP: Coral Spikes, DO       Brief hospital course: This is a 89 year old male who was recently diagnosed MGUS, diabetes mellitus type 2, chronic foley for 2 mo after b/l hydrocelectomy, hypertension who is hard of hearing     He was brought into the hospital from home for swelling "around the Foley catheter" with pain.   CT abd pelvis> noted to have severe constipation and attempted to be disimpacted.  Also noted to have rectosigmoid mass vs colonic narrowing upstream from the rectal impaction . Foley catheter had purulent discharge. Scrotum still enlarge and ulcer still present on bottom of scrotum. Lactic acid noted to be 2.0, K3.3.  Subjective:  -No fevers -No emesis -Feels like he might to be constipated  Assessment and Plan: Principal Problem:   Stercoral colitis with rectal ulcer/ acute on chronic constipation - FOB neg - GI has performed a colonoscopy and had to manually disimpact him - Noted to have a rectal ulcer possibly secondary to stool-biopsies done - Nonbleeding internal hemorrhoids also noted - Plan is to start twice daily MiraLAX, resume diet and follow-up on biopsies - stopped Ceftriaxone and Metronidazole  -Currently on cefepime and Vanco for presumed catheter associated UTI -Possible discharge in 1 to 2 days if good BMs and no further concerns about obstipation   Active Problems:    1)Chronic foley with increasing scrotal edema and pain and chronic scrotal ulcer -Foley catheter -POA - 11/15 bilateral hydrocelectomy, scrotal edema was severe and then foley placed and recommended to stay in place until edema resolved- he did not f/u with urology and has had the same foley for 2 months - still has severe edema -According to the ED doctor and the admitting doctor there was a significant amount of purulent discharge around the Foley catheter when he was  admitted -Foley exchanged by urologist  2)CAUTI---POA -Please note that patient presented with purulent drainage from his chronic Foley catheter this was present on admission-POA -Urology consult appreciated -Continue cefepime and Vanco pending culture data -Continue Flomax --keep scrotum elevated-  Hypokalemia --Normalized with replacement  HTN - Amlodipine. Carvedilol   Macrocytic anemia, recently diagnosed MGUS - Folate 5.9  & Vit B12 1,1176 in 47/42 - cont outpt folic acid   - he has declined a bone marrow biopsy.  Deconditioning, dementia and severely HOH   PT recommending SNF again- he ws discharged from a SNF 2 wks ago and clearly has not done well at home- I have consulted TOC and palliative care  Disposition--physical therapy recommended SNF rehab family thinks they might be better manage him at home with home health services pending further improvement in his overall medical condition     Code Status: Full Code Consultants: GI Level of Care: Level of care: Med-Surg DVT prophylaxis:  heparin injection 5,000 Units Start: 01/28/22 1000 SCDs Start: 01/28/22 0052     Objective:   Vitals:   01/28/22 2350 01/29/22 0444 01/29/22 0558 01/29/22 1400  BP: (!) 164/78 (!) 174/85 (!) 141/69 139/66  Pulse: (!) 55 62 65 (!) 59  Resp: '18 16  17  '$ Temp: 98.6 F (37 C) 98.1 F (36.7 C)  98.6 F (37 C)  TempSrc: Oral Oral  Oral  SpO2: 100% 98%  98%  Weight:      Height:       Filed Weights   01/27/22 1730 01/28/22 0040  Weight: 74.8 kg 84.7 kg   Exam: General exam: Appears comfortable -trying to eat his breakfast in bed  HEENT: oral mucosa moist- HOH Respiratory system: Clear to auscultation.  Cardiovascular system: S1 & S2 heard  Gastrointestinal system: Abdomen soft, non-tender, nondistended. Normal bowel sounds GU- severely enlarged scrotum- foley present Extremities: No cyanosis, clubbing or edema Psychiatry:  Mood & affect appropriate. ,  Somewhat  forgetful   CBC: Recent Labs  Lab 01/27/22 1755 01/27/22 1910 01/28/22 0454 01/29/22 0442  WBC 5.1  --  5.4 4.2  NEUTROABS 2.9  --  3.0  --   HGB 10.6* 12.6* 8.8* 8.8*  HCT 33.1* 37.0* 26.3* 26.8*  MCV 105.1*  --  104.0* 104.7*  PLT 163  --  161 818*   Basic Metabolic Panel: Recent Labs  Lab 01/27/22 1755 01/27/22 1910 01/28/22 0454 01/29/22 0442  NA 138 144 138 139  K 3.3* 3.4* 3.6 4.5  CL 110 109 113* 114*  CO2 22  --  22 21*  GLUCOSE 143* 135* 98 81  BUN '13 11 11 12  '$ CREATININE 1.30* 1.20 1.11 1.08  CALCIUM 8.2*  --  7.8* 8.0*  MG 2.0  --  1.8  --    GFR: Estimated Creatinine Clearance: 45.5 mL/min (by C-G formula based on SCr of 1.08 mg/dL).  Scheduled Meds:  amLODipine  5 mg Oral Daily   aspirin EC  81 mg Oral Daily   bisacodyl  10 mg Rectal Daily   carvedilol  6.25 mg Oral BID   Chlorhexidine Gluconate Cloth  6 each Topical Daily   folic acid  1 mg Oral Daily   heparin  5,000 Units Subcutaneous Q8H   latanoprost  1 drop Both Eyes QHS   polyethylene glycol  17 g Oral BID   senna-docusate  2 tablet Oral QHS   tamsulosin  0.4 mg Oral Daily   Continuous Infusions:  ceFEPime (MAXIPIME) IV Stopped (01/28/22 2148)   vancomycin     Imaging and lab data was personally reviewed CT ABDOMEN PELVIS W CONTRAST  Result Date: 01/27/2022 CLINICAL DATA:  Abdomen pain EXAM: CT ABDOMEN AND PELVIS WITH CONTRAST TECHNIQUE: Multidetector CT imaging of the abdomen and pelvis was performed using the standard protocol following bolus administration of intravenous contrast. RADIATION DOSE REDUCTION: This exam was performed according to the departmental dose-optimization program which includes automated exposure control, adjustment of the mA and/or kV according to patient size and/or use of iterative reconstruction technique. CONTRAST:  123m OMNIPAQUE IOHEXOL 300 MG/ML  SOLN COMPARISON:  CT 07/27/2021 FINDINGS: Lower chest: Lung bases demonstrate no acute airspace disease. Mild  cardiomegaly. Coronary vascular calcification. Hepatobiliary: No focal liver abnormality is seen. No gallstones, gallbladder wall thickening, or biliary dilatation. Pancreas: Unremarkable. No pancreatic ductal dilatation or surrounding inflammatory changes. Spleen: Normal in size without focal abnormality. Adrenals/Urinary Tract: Adrenal glands are within normal limits. Kidneys show no hydronephrosis. Bilateral renal cysts, no imaging follow-up is recommended. The bladder is decompressed by Foley catheter. Bladder is displaced anteriorly. Stomach/Bowel: Stomach is nonenlarged. No dilated small bowel. Large volume of stool throughout the colon suggestive of constipation. Rectal distension by impacted feces. Perirectal and presacral edema and mild soft tissue stranding. Possible irregular focus of colon narrowing upstream to the impacted feces at the rectosigmoid colon, series 2, image 63 and coronal series 5, image 57. Negative appendix Vascular/Lymphatic: Moderate aortic atherosclerosis. No aneurysm. No suspicious lymph nodes. Reproductive: Negative for mass. Other: Negative for pelvic effusion or free air. Fat containing inguinal  hernias. Hazy appearance of central mesentery without mass or adenopathy as before Musculoskeletal: No acute osseous abnormality. IMPRESSION: 1. Large volume of stool throughout the colon suggestive of constipation. Rectal distension by impacted feces with perirectal and presacral edema and soft tissue stranding, question stercoral colitis. Suspected area of colonic narrowing/possible mass at the rectosigmoid colon just upstream to the rectal impaction, this may be further assessed with colonoscopy. 2. No other acute intra-abdominal or pelvic abnormality is seen. 3. Aortic atherosclerosis. Aortic Atherosclerosis (ICD10-I70.0). Electronically Signed   By: Donavan Foil M.D.   On: 01/27/2022 20:38   DG Chest Port 1 View  Result Date: 01/27/2022 CLINICAL DATA:  Questionable sepsis EXAM:  PORTABLE CHEST 1 VIEW COMPARISON:  Chest x-ray 11/30/2021 FINDINGS: The heart size and mediastinal contours are within normal limits. Both lungs are clear. The visualized skeletal structures are unremarkable. Punctate metallic foreign bodies are seen in the left chest wall, unchanged. IMPRESSION: No active disease. Electronically Signed   By: Ronney Asters M.D.   On: 01/27/2022 18:28    LOS: 1 day   Author: Roxan Hockey  01/29/2022 5:50 PM  To contact [facility]>   Check the care team in [facility] and look for the attending/consulting [place] provider listed  Log into [email or web address] and use [place]'s universal password   Go to> "[facility]"  and find provider  If you still have difficulty reaching the provider, please page the [facility] (Director on Call) for the Hospitalists listed on amion

## 2022-01-30 ENCOUNTER — Other Ambulatory Visit (HOSPITAL_COMMUNITY): Payer: Self-pay

## 2022-01-30 DIAGNOSIS — T839XXS Unspecified complication of genitourinary prosthetic device, implant and graft, sequela: Secondary | ICD-10-CM

## 2022-01-30 DIAGNOSIS — Z515 Encounter for palliative care: Secondary | ICD-10-CM | POA: Diagnosis not present

## 2022-01-30 DIAGNOSIS — K5289 Other specified noninfective gastroenteritis and colitis: Secondary | ICD-10-CM | POA: Diagnosis not present

## 2022-01-30 DIAGNOSIS — Z7189 Other specified counseling: Secondary | ICD-10-CM | POA: Diagnosis not present

## 2022-01-30 LAB — CBC
HCT: 25.7 % — ABNORMAL LOW (ref 39.0–52.0)
Hemoglobin: 8.4 g/dL — ABNORMAL LOW (ref 13.0–17.0)
MCH: 33.9 pg (ref 26.0–34.0)
MCHC: 32.7 g/dL (ref 30.0–36.0)
MCV: 103.6 fL — ABNORMAL HIGH (ref 80.0–100.0)
Platelets: 149 10*3/uL — ABNORMAL LOW (ref 150–400)
RBC: 2.48 MIL/uL — ABNORMAL LOW (ref 4.22–5.81)
RDW: 20.7 % — ABNORMAL HIGH (ref 11.5–15.5)
WBC: 4.1 10*3/uL (ref 4.0–10.5)
nRBC: 1 % — ABNORMAL HIGH (ref 0.0–0.2)

## 2022-01-30 LAB — URINE CULTURE: Culture: >=100000 — AB

## 2022-01-30 LAB — OCCULT BLOOD X 1 CARD TO LAB, STOOL: Fecal Occult Bld: NEGATIVE

## 2022-01-30 LAB — SURGICAL PATHOLOGY

## 2022-01-30 MED ORDER — LINEZOLID 600 MG PO TABS
600.0000 mg | ORAL_TABLET | Freq: Two times a day (BID) | ORAL | Status: DC
  Administered 2022-01-31: 600 mg via ORAL
  Filled 2022-01-30 (×7): qty 1

## 2022-01-30 NOTE — TOC Benefit Eligibility Note (Signed)
Patient Teacher, English as a foreign language completed.    The patient is currently admitted and upon discharge could be taking linezolid (Zyvox) 600 mg tablets.  The current 10 day co-pay is $1.55.   The patient is insured through [place], [facility] Patient Advocate Specialist [place] Patient Advocate Team Direct Number: [phone]  Fax: [phone]

## 2022-01-30 NOTE — Progress Notes (Signed)
[facility] Progress Note  Patient: Terrance Miller     RCB:[id number]  DOA: 01/27/2022   PCP: Coral Spikes, DO       Brief hospital course: This is a 89 year old male who was recently diagnosed MGUS, diabetes mellitus type 2, chronic foley for 2 mo after b/l hydrocelectomy, hypertension who is hard of hearing     He was brought into the hospital from home for swelling "around the Foley catheter" with pain.   CT abd pelvis> noted to have severe constipation and attempted to be disimpacted.  Also noted to have rectosigmoid mass vs colonic narrowing upstream from the rectal impaction . Foley catheter had purulent discharge. Scrotum still enlarge and ulcer still present on bottom of scrotum. Lactic acid noted to be 2.0, K3.3.  Subjective:  -had BM No fevers  Assessment and Plan: Principal Problem:    1)Stercoral colitis with rectal ulcer/ acute on chronic constipation - FOB neg - GI has performed a colonoscopy and had to manually disimpact him - Noted to have a rectal ulcer possibly secondary to stool-biopsies done - Nonbleeding internal hemorrhoids also noted - Plan is to start twice daily MiraLAX, resume diet and follow-up on biopsies -Initially treated with ceftriaxone and Metronidazole  -Transitioned to cefepime and Vanco  -Okay to stop cefepime  Active Problems:    2)Chronic foley with increasing scrotal edema and pain and chronic scrotal ulcer -Foley catheter -POA - 11/15 bilateral hydrocelectomy, scrotal edema was severe and then foley placed and recommended to stay in place until edema resolved- he did not f/u with urology and has had the same foley for 2 months - still has scrotal edema --Foley exchanged by urologist -Continue Flomax and patient will need to follow-up with urologist  3)MRSA CAUTI---POA -Please note that patient presented with purulent drainage from his chronic Foley catheter this was present on admission-POA -Urology consult  appreciated --Continue Flomax --keep scrotum elevated- -antibiotics as above -Continue Vanco plan to transition to Zyvox  Hypokalemia --Normalized with replacement  HTN - Amlodipine. Carvedilol   Macrocytic anemia, recently diagnosed MGUS - Folate 5.9  & Vit B12 1,1176 in 80/32 - cont outpt folic acid   - he has declined a bone marrow biopsy.  Deconditioning, dementia and severely HOH--physical therapy eval appreciated recommends SNF  he ws discharged from a SNF 2 wks ago  -Patient and daughter refusing SNF, they prefer to go home with home health services  Disposition--physical therapy recommended SNF rehab family thinks they might be better manage him at home with home health services      Code Status: Full Code Consultants: GI Level of Care: Level of care: Med-Surg DVT prophylaxis:  heparin injection 5,000 Units Start: 01/28/22 1000 SCDs Start: 01/28/22 0052     Objective:   Vitals:   01/29/22 1400 01/29/22 2058 01/30/22 0423 01/30/22 1252  BP: 139/66 (!) 152/60 (!) 161/83 132/67  Pulse: (!) 59 (!) 58 (!) 59 62  Resp: '17 18 19 20  '$ Temp: 98.6 F (37 C) 98.7 F (37.1 C) 97.9 F (36.6 C) 98.3 F (36.8 C)  TempSrc: Oral Oral Oral Oral  SpO2: 98% 100% 97% 100%  Weight:      Height:       Filed Weights   01/27/22 1730 01/28/22 0040  Weight: 74.8 kg 84.7 kg   Exam: General exam: Appears comfortable -trying to eat his breakfast in bed  HEENT: oral mucosa moist- HOH Respiratory system: Clear to auscultation.  Cardiovascular system: S1 & S2 heard  Gastrointestinal system: Abdomen soft, non-tender, nondistended. Normal bowel sounds GU- severely enlarged scrotum- foley present--see photos in epic Extremities: No cyanosis, clubbing or edema Psychiatry:  Mood & affect appropriate. ,  Somewhat forgetful   CBC: Recent Labs  Lab 01/27/22 1755 01/27/22 1910 01/28/22 0454 01/29/22 0442 01/30/22 0502  WBC 5.1  --  5.4 4.2 4.1  NEUTROABS 2.9  --  3.0  --   --    HGB 10.6* 12.6* 8.8* 8.8* 8.4*  HCT 33.1* 37.0* 26.3* 26.8* 25.7*  MCV 105.1*  --  104.0* 104.7* 103.6*  PLT 163  --  161 147* 284*   Basic Metabolic Panel: Recent Labs  Lab 01/27/22 1755 01/27/22 1910 01/28/22 0454 01/29/22 0442  NA 138 144 138 139  K 3.3* 3.4* 3.6 4.5  CL 110 109 113* 114*  CO2 22  --  22 21*  GLUCOSE 143* 135* 98 81  BUN '13 11 11 12  '$ CREATININE 1.30* 1.20 1.11 1.08  CALCIUM 8.2*  --  7.8* 8.0*  MG 2.0  --  1.8  --    GFR: Estimated Creatinine Clearance: 45.5 mL/min (by C-G formula based on SCr of 1.08 mg/dL).  Scheduled Meds:  amLODipine  5 mg Oral Daily   aspirin EC  81 mg Oral Daily   bisacodyl  10 mg Rectal Daily   carvedilol  6.25 mg Oral BID   Chlorhexidine Gluconate Cloth  6 each Topical Daily   folic acid  1 mg Oral Daily   heparin  5,000 Units Subcutaneous Q8H   latanoprost  1 drop Both Eyes QHS   [START ON 01/31/2022] linezolid  600 mg Oral Q12H   polyethylene glycol  17 g Oral BID   senna-docusate  2 tablet Oral QHS   tamsulosin  0.4 mg Oral Daily   Continuous Infusions:  ceFEPime (MAXIPIME) IV 2 g (01/29/22 2216)   vancomycin 750 mg (01/29/22 2359)   Imaging and lab data was personally reviewed No results found.  LOS: 2 days   Author: Roxan Hockey  01/30/2022 7:29 PM  To contact [facility]>   Check the care team in [facility] and look for the attending/consulting [place] provider listed  Log into [email or web address] and use [place]'s universal password   Go to> "[facility]"  and find provider  If you still have difficulty reaching the provider, please page the [facility], [facility] (Director on Call) for the Hospitalists listed on amion

## 2022-01-30 NOTE — Progress Notes (Signed)
Palliative: [name] is resting quietly in bed.  He greets me, making and somewhat keeping eye contact.  He appears acutely/chronically ill and somewhat frail.  He has very poor dentition.  He is alert, oriented to self only.  He is calm and cooperative.  I believe that he can make his needs known.  There is no family at bedside at this time.  Call to daughter, Terrance Miller.  No answer, unable to leave voicemail message.  Conference with attending, bedside nursing staff, transition of care team related to patient condition, needs, goals of care, disposition.  Plan:  At this point, continue full scope/full code.  Anticipate return to daughter's home with home health.  Would benefit from outpatient palliative services.  25 minutes  Terrance Axe, NP Palliative medicine team Team phone [phone] Greater than 50% of this time was spent counseling and coordinating care related to the above assessment and plan.

## 2022-01-31 DIAGNOSIS — K5289 Other specified noninfective gastroenteritis and colitis: Secondary | ICD-10-CM | POA: Diagnosis not present

## 2022-01-31 LAB — CBC
HCT: 25.9 % — ABNORMAL LOW (ref 39.0–52.0)
Hemoglobin: 8.6 g/dL — ABNORMAL LOW (ref 13.0–17.0)
MCH: 34.1 pg — ABNORMAL HIGH (ref 26.0–34.0)
MCHC: 33.2 g/dL (ref 30.0–36.0)
MCV: 102.8 fL — ABNORMAL HIGH (ref 80.0–100.0)
Platelets: 172 10*3/uL (ref 150–400)
RBC: 2.52 MIL/uL — ABNORMAL LOW (ref 4.22–5.81)
RDW: 20.8 % — ABNORMAL HIGH (ref 11.5–15.5)
WBC: 4.1 10*3/uL (ref 4.0–10.5)
nRBC: 1 % — ABNORMAL HIGH (ref 0.0–0.2)

## 2022-01-31 MED ORDER — BISACODYL 10 MG RE SUPP: 10.0000 mg | RECTAL | 3 refills | Status: DC

## 2022-01-31 MED ORDER — HYDROCORTISONE ACETATE 25 MG RE SUPP: 25.0000 mg | RECTAL | 1 refills | Status: DC

## 2022-01-31 MED ORDER — ASPIRIN 81 MG PO TBEC: 81.0000 mg | DELAYED_RELEASE_TABLET | Freq: Every day | ORAL | 3 refills | Status: DC

## 2022-01-31 MED ORDER — AMLODIPINE BESYLATE 5 MG PO TABS: 5.0000 mg | ORAL_TABLET | Freq: Every day | ORAL | 2 refills | Status: DC

## 2022-01-31 MED ORDER — LINEZOLID 600 MG PO TABS: 600.0000 mg | ORAL_TABLET | Freq: Two times a day (BID) | ORAL | 0 refills | Status: DC

## 2022-01-31 MED ORDER — POLYETHYLENE GLYCOL 3350 17 G PO PACK: 17.0000 g | PACK | Freq: Two times a day (BID) | ORAL | 3 refills | Status: DC

## 2022-01-31 NOTE — Progress Notes (Signed)
Physical Therapy Treatment Patient Details Name: Terrance Miller MRN: [id number] DOB: 06-15-1928 Today's Date: 01/31/2022   History of Present Illness Terrance Miller is a 89 y.o. male with medical history significant of coronary artery disease, hyperlipidemia, hypertension, type 2 diabetes mellitus, presents the ED with a chief complaint of Foley catheter pain and swelling of the penis.  Patient was most recently seen by urology on December 5.  He was status post hydrocelectomy with penoscrotal edema.  He had superficial wound dehiscence.  The plan was to continue wound care and continue Foley.  Does not look like he ever followed up with urology after discharge.  Patient reports that he does not know why he is here.  There is no family at bedside.  He is oriented to himself, place, and age, but he did get the year wrong.  Although he tells me he is in the hospital and names any pen, as well as leaving the room he said he is probably can have to go to the hospital today.  Is unclear if he is altered or if this is his baseline.  In any event, patient is not able to provide any history as he does not know why he is here.  He does report that he is not in any pain at this time.    PT Comments    Patient demonstrates improvement for siting up at bedside and scooting to EOB, increased BLE strength for completing sit to stands, increased endurance/distance for gait training, but requires repeated verbal/tactile cueing to avoid hitting walls and nearby objects possibly due to poor eye sight and/or decreased proprioception.  Patient requested to go back to bed after therapy secondary to fatigue and having sat up in chair earlier today.  Patient will benefit from continued skilled physical therapy in hospital and recommended venue below to increase strength, balance, endurance for safe ADLs and gait.     Recommendations for follow up therapy are one component of a multi-disciplinary discharge planning process, led by  the attending physician.  Recommendations may be updated based on patient status, additional functional criteria and insurance authorization.  Follow Up Recommendations  Skilled nursing-short term rehab (<3 hours/day) Can patient physically be transported by private vehicle: Yes   Assistance Recommended at Discharge Intermittent Supervision/Assistance  Patient can return home with the following A lot of help with bathing/dressing/bathroom;A lot of help with walking and/or transfers;Help with stairs or ramp for entrance;Assistance with cooking/housework   Equipment Recommendations  None recommended by PT    Recommendations for Other Services       Precautions / Restrictions Precautions Precautions: Fall Restrictions Weight Bearing Restrictions: No     Mobility  Bed Mobility Overal bed mobility: Needs Assistance Bed Mobility: Supine to Sit     Supine to sit: Min assist, Mod assist     General bed mobility comments: demonstrated improvement for scooting to EOB with less c/o discomfort in scrotal area    Transfers Overall transfer level: Needs assistance Equipment used: Rolling walker (2 wheels) Transfers: Sit to/from Stand, Bed to chair/wheelchair/BSC Sit to Stand: Min guard, Min assist   Step pivot transfers: Min assist       General transfer comment: increased time, labored movement    Ambulation/Gait Ambulation/Gait assistance: Min assist Gait Distance (Feet): 40 Feet Assistive device: Rolling walker (2 wheels) Gait Pattern/deviations: Decreased step length - right, Decreased step length - left, Decreased stride length Gait velocity: decreased     General Gait Details: increased endurance, distance for  gait training with slow labored cadence, required much guidance for avoiding walls, objects possibly due to poor eye sight   Stairs             Wheelchair Mobility    Modified Rankin (Stroke Patients Only)       Balance Overall balance  assessment: Needs assistance Sitting-balance support: Feet supported, No upper extremity supported Sitting balance-Leahy Scale: Fair Sitting balance - Comments: fair/good seated at EOB   Standing balance support: During functional activity, Bilateral upper extremity supported Standing balance-Leahy Scale: Fair Standing balance comment: using RW                            Cognition Arousal/Alertness: Awake/alert Behavior During Therapy: WFL for tasks assessed/performed Overall Cognitive Status: No family/caregiver present to determine baseline cognitive functioning                                          Exercises General Exercises - Lower Extremity Ankle Circles/Pumps: Seated, AROM, Both, 10 reps, Strengthening Long Arc Quad: Seated, AROM, Strengthening, Both, 10 reps Hip Flexion/Marching: Seated, AROM, Strengthening, Both, 10 reps    General Comments        Pertinent Vitals/Pain Pain Assessment Pain Assessment: No/denies pain    Home Living                          Prior Function            PT Goals (current goals can now be found in the care plan section) Acute Rehab PT Goals Patient Stated Goal: return home with family to assist PT Goal Formulation: With patient Time For Goal Achievement: 02/11/22 Potential to Achieve Goals: Good Progress towards PT goals: Progressing toward goals    Frequency    Min 3X/week      PT Plan Current plan remains appropriate    Co-evaluation PT/OT/SLP Co-Evaluation/Treatment: Yes            AM-PAC PT "6 Clicks" Mobility   Outcome Measure  Help needed turning from your back to your side while in a flat bed without using bedrails?: A Little Help needed moving from lying on your back to sitting on the side of a flat bed without using bedrails?: A Little Help needed moving to and from a bed to a chair (including a wheelchair)?: A Lot Help needed standing up from a chair using your  arms (e.g., wheelchair or bedside chair)?: A Lot Help needed to walk in hospital room?: A Lot Help needed climbing 3-5 steps with a railing? : A Lot 6 Click Score: 14    End of Session   Activity Tolerance: Patient tolerated treatment well;Patient limited by fatigue Patient left: in bed;with call bell/phone within reach Nurse Communication: Mobility status PT Visit Diagnosis: Other abnormalities of gait and mobility (R26.89);Unsteadiness on feet (R26.81);Muscle weakness (generalized) (M62.81)     Time: 0350-0938 PT Time Calculation (min) (ACUTE ONLY): 20 min  Charges:  $Gait Training: 8-22 mins $Therapeutic Exercise: 8-22 mins                     2:35 PM, 01/31/22 Lonell Grandchild, MPT Physical Therapist with [facility] [phone] office [phone] mobile phone

## 2022-01-31 NOTE — Progress Notes (Signed)
Occupational Therapy Treatment Patient Details Name: Terrance Miller MRN: [id number] DOB: [birth year] Today's Date: 01/31/2022   History of present illness Terrance Miller is a 89 y.o. male with medical history significant of coronary artery disease, hyperlipidemia, hypertension, type 2 diabetes mellitus, presents the ED with a chief complaint of Foley catheter pain and swelling of the penis.  Patient was most recently seen by urology on December 5.  He was status post hydrocelectomy with penoscrotal edema.  He had superficial wound dehiscence.  The plan was to continue wound care and continue Foley.  Does not look like he ever followed up with urology after discharge.  Patient reports that he does not know why he is here.  There is no family at bedside.  He is oriented to himself, place, and age, but he did get the year wrong.  Although he tells me he is in the hospital and names any pen, as well as leaving the room he said he is probably can have to go to the hospital today.  Is unclear if he is altered or if this is his baseline.  In any event, patient is not able to provide any history as he does not know why he is here.  He does report that he is not in any pain at this time.   OT comments  Pt agreeable to OT treatment. Pt awake and engaged well. Mod A needed for bed mobility today. Improved ambulation forward and backward for ~10 feet total with a rest in the middle. Pt required mostly min A for ambulation with RW. Pt left in chair with chair alarm set. Pt will benefit from continued OT in the hospital and recommended venue below to increase strength, balance, and endurance for safe ADL's.      Recommendations for follow up therapy are one component of a multi-disciplinary discharge planning process, led by the attending physician.  Recommendations may be updated based on patient status, additional functional criteria and insurance authorization.    Follow Up Recommendations  Skilled nursing-short term  rehab (<3 hours/day)     Assistance Recommended at Discharge Frequent or constant Supervision/Assistance  Patient can return home with the following  Two people to help with walking and/or transfers;A lot of help with bathing/dressing/bathroom;Assistance with cooking/housework;Assist for transportation;Help with stairs or ramp for entrance   Equipment Recommendations  None recommended by OT    Recommendations for Other Services      Precautions / Restrictions Precautions Precautions: Fall Restrictions Weight Bearing Restrictions: No       Mobility Bed Mobility Overal bed mobility: Needs Assistance Bed Mobility: Rolling, Supine to Sit Rolling: Min assist   Supine to sit: Mod assist     General bed mobility comments: Increased time. Assist to manage scrotum during bed mobility. Assist to bring legs to EOB and pull to sit.    Transfers Overall transfer level: Needs assistance Equipment used: Rolling walker (2 wheels) Transfers: Sit to/from Stand, Bed to chair/wheelchair/BSC Sit to Stand: Min guard, Min assist     Step pivot transfers: Min assist, Mod assist     General transfer comment: Assist to manage RW during pivot. Slow labored movement.     Balance Overall balance assessment: Needs assistance Sitting-balance support: Feet supported, No upper extremity supported Sitting balance-Leahy Scale: Fair Sitting balance - Comments: seated at EOB   Standing balance support: During functional activity, Reliant on assistive device for balance, Bilateral upper extremity supported Standing balance-Leahy Scale: Fair Standing balance comment: poor to fair with  RW                           ADL either performed or assessed with clinical judgement   ADL Overall ADL's : Needs assistance/impaired                                     Functional mobility during ADLs: Minimal assistance;Rolling walker (2 wheels) General ADL Comments: Pt ambulated a  total of ~20 feet in the room going forward and backward from chair. One rest break after ~10 feet. RW used throughout.      Cognition Arousal/Alertness: Awake/alert Behavior During Therapy: WFL for tasks assessed/performed Overall Cognitive Status: No family/caregiver present to determine baseline cognitive functioning                                                             Pertinent Vitals/ Pain       Pain Assessment Pain Assessment: No/denies pain                                                          Frequency  Min 2X/week        Progress Toward Goals  OT Goals(current goals can now be found in the care plan section)  Progress towards OT goals: Progressing toward goals  Acute Rehab OT Goals Patient Stated Goal: return home OT Goal Formulation: With patient Time For Goal Achievement: 02/11/22 Potential to Achieve Goals: Good ADL Goals Pt Will Perform Grooming: with supervision;standing Pt Will Perform Lower Body Bathing: with mod assist;sitting/lateral leans Pt Will Perform Lower Body Dressing: with mod assist;with min assist;sitting/lateral leans Pt Will Transfer to Toilet: with modified independence;ambulating Pt Will Perform Toileting - Clothing Manipulation and hygiene: with min assist;sitting/lateral leans Pt/caregiver will Perform Home Exercise Program: Increased strength;Both right and left upper extremity;Independently  Plan Discharge plan remains appropriate                                    End of Session Equipment Utilized During Treatment: Rolling walker (2 wheels);Gait belt  OT Visit Diagnosis: Unsteadiness on feet (R26.81);Other abnormalities of gait and mobility (R26.89);Muscle weakness (generalized) (M62.81)   Activity Tolerance Patient tolerated treatment well   Patient Left in chair;with call bell/phone within reach;with chair alarm set   Nurse Communication           Time: [phone] OT Time Calculation (min): 20 min  Charges: OT General Charges $OT Visit: 1 Visit OT Treatments $Therapeutic Exercise: 8-22 mins  Jamesen Stahnke OT, MOT  Larey Seat 01/31/2022, 9:16 AM

## 2022-01-31 NOTE — TOC Transition Note (Signed)
Transition of Care [facility]) - CM/SW Discharge Note   Patient Details  Name: Terrance Miller MRN: [id number] Date of Birth: Oct 27, 1928  Transition of Care [facility]) CM/SW Contact:  Salome Arnt, [place] Phone Number: 01/31/2022, 1:26 PM   Clinical Narrative:  Anticipate d/c later today. LCSW spoke with pt's daughter, [name] to confirm plan for pt to return home with home health. [name] reports they are with pt around the clock and pt was only able to stand for about a minute prior to admission. Reviewed PT evaluation with daughter who was pleased to hear pt was able to ambulate about 30 feet. She confirms plan to return home with HHPT/RN. MD updated. Marjory Lies with [facility] aware of expected d/c. MD aware needs orders.      Final next level of care: Home w Home Health Services Barriers to Discharge: Barriers Resolved   Patient Goals and CMS Choice CMS Medicare.gov Compare Post Acute Care list provided to:: Patient Represenative (must comment) Choice offered to / list presented to : Adult Children  Discharge Placement                    Name of family member notified: [name]- daughter Patient and family notified of of transfer: 01/31/22  Discharge Plan and Services Additional resources added to the After Visit Summary for   In-house Referral: Clinical Social Work   Post Acute Care Choice: Home Health                    HH Arranged: RN, PT [facility] Agency: [place] Date [place]: 01/31/22 Time [place]: 0102 Representative spoke with at [place]: [place] ([place]) Interventions SDOH Screenings   Food Insecurity: No Food Insecurity (01/30/2022)  Housing: Low Risk  (01/30/2022)  Transportation Needs: No Transportation Needs (01/30/2022)  Utilities: Not At Risk (01/30/2022)  Alcohol Screen: Low Risk  (07/16/2021)  Depression (PHQ2-9): Low Risk  (07/16/2021)  Financial Resource Strain: Low Risk  (07/16/2021)  Physical  Activity: Sufficiently Active (07/16/2021)  Social Connections: Moderately Isolated (07/16/2021)  Stress: No Stress Concern Present (07/16/2021)  Tobacco Use: Medium Risk (01/29/2022)     Readmission Risk Interventions     No data to display

## 2022-01-31 NOTE — Discharge Summary (Signed)
Terrance Miller, is a 89 y.o. male  DOB July 01, 1928  MRN [id number].  Admission date:  01/27/2022  Admitting Physician  Debbe Odea, MD  Discharge Date:  01/31/2022   Primary MD  Coral Spikes, DO  Recommendations for primary care physician for things to follow:   1)Scrotal Wound Care:- Wash wound with soap and water, pat dry Apply single layer of xeroform gauze, top with foam Change every other day.   2)Routine Foley catheter care  3)Please follow-up with Urologist Dr. Alyson Ingles in about --- in 1 to 2 weeks for recheck and follow-up evaluation  in his office----[facility], [address], [place] [address] Phone Number----[phone]  4)Please Complete Linezolid/Zyvox antibiotic as prescribed  5)Please Repeat CBC and BMP tests in 1 week  Admission Diagnosis  Stercoral colitis [K52.89] UTI (urinary tract infection) due to urinary indwelling Foley catheter ([place]) [K56.256L, N39.0]   Discharge Diagnosis  Stercoral colitis [K52.89] UTI (urinary tract infection) due to urinary indwelling Foley catheter ([place]) [S93.734K, N39.0]    Principal Problem:   Stercoral colitis Active Problems:   Hypertension   CAD (coronary artery disease)   UTI (urinary tract infection)   Hypokalemia   Complication of Foley catheter ([place])   Pressure injury of skin   Colonic mass   Impacted stool in rectum (HCC)   Abnormal CT scan, gastrointestinal tract   UTI (urinary tract infection) due to urinary indwelling Foley catheter [facility])      Past Medical History:  Diagnosis Date   Acute ST elevation myocardial infarction (STEMI) of inferior wall ([place]) 2004   Coronary artery disease    DES x2 to the RCA 2004, residual disease managed medically   Hyperlipidemia    Hypertension    Type 2 diabetes mellitus ([place])     Past Surgical History:  Procedure Laterality Date   CATARACT EXTRACTION  Bilateral    CORONARY ANGIOPLASTY WITH STENT PLACEMENT  09/05/2002   stent RCA, 80% first diagonal, 70-80% mid-diagonal, 80% mid LAD stenosis   HYDROCELE EXCISION Bilateral 11/20/2021   Procedure: HYDROCELECTOMY ADULT;  Surgeon: Primus Bravo., MD;  Location: [facility];  Service: Urology;  Laterality: Bilateral;   NM MYOCAR PERF WALL MOTION  11/01/2008   Normal     HPI  from the history and physical done on the day of admission:    HPI: Terrance Miller is a 89 y.o. male with medical history significant of coronary artery disease, hyperlipidemia, hypertension, type 2 diabetes mellitus, presents the ED with a chief complaint of Foley catheter pain and swelling of the penis. Patient was most recently seen by urology on December 5.  He was status post hydrocelectomy with penoscrotal edema.  He had superficial wound dehiscence.  The plan was to continue wound care and continue Foley.  Does not look like he ever followed up with urology after discharge.  Patient reports that he does not know why he is here.  There is no family at bedside.  He is oriented to himself, place,  and age, but he did get the year wrong.  Although he tells me he is in the hospital and names any pen, as well as leaving the room he said he is probably can have to go to the hospital today.  Is unclear if he is altered or if this is his baseline.  In any event, patient is not able to provide any history as he does not know why he is here.  He does report that he is not in any pain at this time.   There is no DNR or MOST form at bedside Review of Systems: unable to review all systems due to the inability of the patient to answer questions.   Hospital Course:   Assessment and Plan:  1)Stercoral colitis with rectal ulcer/ acute on chronic constipation - FOB neg - GI has performed a colonoscopy and had to manually disimpact him - Noted to have a rectal ulcer possibly secondary to stool-biopsies done - Nonbleeding internal  hemorrhoids also noted -c/n  twice daily MiraLAX, Dulcolax suppository to avoid constipation -Treated with ceftriaxone and Metronidazole  -Also received cefepime and Vanco  -Colonoscopic  biopsies unremarkable  -Anusol HC as ordered for hemorrhoids  2)Chronic Swelling Foley with increasing scrotal edema and pain and chronic scrotal ulcer -Foley catheter -POA - 11/20/21 bilateral hydrocelectomy, scrotal edema was severe and then foley placed and recommended to stay in place until edema resolved-  -Foley catheter was exchanged by urologist  -Follow-up with urology advised  3)MRSA CAUTI---POA -Please note that patient presented with purulent drainage from his chronic Foley catheter this was present on admission-POA -Urology consult appreciated---Foley catheter was exchanged by urologist as noted above #2 -Treated with antibiotics as above #1 including vancomycin -Will discharge on Zyvox -Continue Flomax --keep scrotum elevated-   4)Hypokalemia --Normalized with replacement -Repeat BMP in a week   5)HTN -Continue amlodipine,  Carvedilol    6)Macrocytic anemia, recently diagnosed MGUS - Folate 5.9  & Vit B12 1,1176 in 60/63 - cont outpt folic acid   - he has declined a bone marrow biopsy. -Repeat CBC in a week   7)Deconditioning, dementia and severely HOH--- physical therapy rec meds SNF rehab -Patient and daughter declined SNF rehab -They have requested home health physical therapy and RN services   Disposition--Home with home health services per patient and daughter's request    Code Status: Full Code Consultants: GI and Urology  Discharge Condition: stable  Follow UP   Follow-up Information     [facility].   Specialty: Home Health Services Why: Will contact you to schedule home health visits. Contact information: [address] [place] [address] [phone]         Coral Spikes, DO. Schedule an appointment as soon as  possible for a visit in 1 week(s).   Specialty: Family Medicine Contact information: [place] [address] [phone]         Cleon Gustin, MD. Call in 1 week(s).   Specialty: Urology Contact information: [address] [place] [address] [phone]                  Consults obtained -GI and urology  Diet and Activity recommendation:  As advised  Discharge Instructions    Discharge Instructions     Call MD for:  difficulty breathing, headache or visual disturbances   Complete by: As directed    Call MD for:  persistant  dizziness or light-headedness   Complete by: As directed    Call MD for:  persistant nausea and vomiting   Complete by: As directed    Call MD for:  temperature >100.4   Complete by: As directed    Diet - low sodium heart healthy   Complete by: As directed    Discharge instructions   Complete by: As directed    1)Scrotal Wound Care:- Wash wound with soap and water, pat dry Apply single layer of xeroform gauze, top with foam Change every other day.   2)Routine Foley catheter care  3)Please follow-up with Urologist Dr. Alyson Ingles in about --- in 1 to 2 weeks for recheck and follow-up evaluation  in his office----[facility], [address], [place] [address] Phone Number----[phone]  4)Please Complete Linezolid/Zyvox antibiotic as prescribed  5)Please Repeat CBC and BMP tests in 1 week   Discharge wound care:   Complete by: As directed    Scrotal Wound Care:- Wash wound with soap and water, pat dry Apply single layer of xeroform gauze, top with foam Change every other day.   Increase activity slowly   Complete by: As directed        Discharge Medications     Allergies as of 01/31/2022   No Known Allergies      Medication List     TAKE these medications    amLODipine 5 MG tablet Commonly known as: NORVASC Take 1 tablet (5 mg total) by mouth daily.    aspirin EC 81 MG tablet Take 1 tablet (81 mg total) by mouth daily with breakfast. Swallow whole. What changed: when to take this   bisacodyl 10 MG suppository Commonly known as: DULCOLAX Place 1 suppository (10 mg total) rectally every Monday, Wednesday, and Friday.   carvedilol 6.25 MG tablet Commonly known as: COREG Take 1 tablet (6.25 mg total) by mouth 2 (two) times daily.   cholecalciferol 1000 units tablet Commonly known as: VITAMIN D Take 1,000 Units by mouth daily.   cyanocobalamin 1000 MCG tablet Commonly known as: VITAMIN B12 Take 1,000 mcg by mouth daily.   folic acid 1 MG tablet Commonly known as: FOLVITE Take 1 tablet (1 mg total) by mouth daily.   linezolid 600 MG tablet Commonly known as: ZYVOX Take 1 tablet (600 mg total) by mouth every 12 (twelve) hours.   polyethylene glycol 17 g packet Commonly known as: MIRALAX / GLYCOLAX Take 17 g by mouth 2 (two) times daily.   tamsulosin 0.4 MG Caps capsule Commonly known as: FLOMAX Take 0.4 mg by mouth daily.   Travatan Z 0.004 % Soln ophthalmic solution Generic drug: Travoprost (BAK Free) Place 1 drop into both eyes 2 (two) times daily.               Discharge Care Instructions  (From admission, onward)           Start     Ordered   01/31/22 0000  Discharge wound care:       Comments: Scrotal Wound Care:- Wash wound with soap and water, pat dry Apply single layer of xeroform gauze, top with foam Change every other day.   01/31/22 1509            Major procedures and Radiology Reports - PLEASE review detailed and final reports for all details, in brief -  CT ABDOMEN PELVIS W CONTRAST  Result Date: 01/27/2022 CLINICAL DATA:  Abdomen pain EXAM: CT ABDOMEN AND PELVIS WITH CONTRAST  TECHNIQUE: Multidetector CT imaging of the abdomen and pelvis was performed using the standard protocol following bolus administration of intravenous contrast. RADIATION DOSE REDUCTION: This exam was performed  according to the departmental dose-optimization program which includes automated exposure control, adjustment of the mA and/or kV according to patient size and/or use of iterative reconstruction technique. CONTRAST:  135m OMNIPAQUE IOHEXOL 300 MG/ML  SOLN COMPARISON:  CT 07/27/2021 FINDINGS: Lower chest: Lung bases demonstrate no acute airspace disease. Mild cardiomegaly. Coronary vascular calcification. Hepatobiliary: No focal liver abnormality is seen. No gallstones, gallbladder wall thickening, or biliary dilatation. Pancreas: Unremarkable. No pancreatic ductal dilatation or surrounding inflammatory changes. Spleen: Normal in size without focal abnormality. Adrenals/Urinary Tract: Adrenal glands are within normal limits. Kidneys show no hydronephrosis. Bilateral renal cysts, no imaging follow-up is recommended. The bladder is decompressed by Foley catheter. Bladder is displaced anteriorly. Stomach/Bowel: Stomach is nonenlarged. No dilated small bowel. Large volume of stool throughout the colon suggestive of constipation. Rectal distension by impacted feces. Perirectal and presacral edema and mild soft tissue stranding. Possible irregular focus of colon narrowing upstream to the impacted feces at the rectosigmoid colon, series 2, image 63 and coronal series 5, image 57. Negative appendix Vascular/Lymphatic: Moderate aortic atherosclerosis. No aneurysm. No suspicious lymph nodes. Reproductive: Negative for mass. Other: Negative for pelvic effusion or free air. Fat containing inguinal hernias. Hazy appearance of central mesentery without mass or adenopathy as before Musculoskeletal: No acute osseous abnormality. IMPRESSION: 1. Large volume of stool throughout the colon suggestive of constipation. Rectal distension by impacted feces with perirectal and presacral edema and soft tissue stranding, question stercoral colitis. Suspected area of colonic narrowing/possible mass at the rectosigmoid colon just upstream to  the rectal impaction, this may be further assessed with colonoscopy. 2. No other acute intra-abdominal or pelvic abnormality is seen. 3. Aortic atherosclerosis. Aortic Atherosclerosis (ICD10-I70.0). Electronically Signed   By: KDonavan FoilM.D.   On: 01/27/2022 20:38   DG Chest Port 1 View  Result Date: 01/27/2022 CLINICAL DATA:  Questionable sepsis EXAM: PORTABLE CHEST 1 VIEW COMPARISON:  Chest x-ray 11/30/2021 FINDINGS: The heart size and mediastinal contours are within normal limits. Both lungs are clear. The visualized skeletal structures are unremarkable. Punctate metallic foreign bodies are seen in the left chest wall, unchanged. IMPRESSION: No active disease. Electronically Signed   By: ARonney AstersM.D.   On: 01/27/2022 18:28    Micro Results   Recent Results (from the past 240 hour(s))  Blood Culture (routine x 2)     Status: None (Preliminary result)   Collection Time: 01/27/22  6:05 PM   Specimen: BLOOD  Result Value Ref Range Status   Specimen Description BLOOD BLOOD LEFT HAND  Final   Special Requests   Final    BOTTLES DRAWN AEROBIC AND ANAEROBIC Blood Culture adequate volume   Culture   Final    NO GROWTH 4 DAYS [facility] [address] [place] [address]   Report Status PENDING  Incomplete  Urine Culture     Status: Abnormal   Collection Time: 01/27/22  6:55 PM   Specimen: Urine, Clean Catch  Result Value Ref Range Status   Specimen Description   Final    URINE, CLEAN CATCH [facility] [address], [place] [address]   Special Requests   Final    NONE [facility] [address] [place] [address]   Culture (A)  Final    >=  100,000 COLONIES/mL METHICILLIN RESISTANT STAPHYLOCOCCUS AUREUS   Report Status 01/30/2022 FINAL  Final   Organism ID, Bacteria METHICILLIN RESISTANT STAPHYLOCOCCUS AUREUS (A)  Final      Susceptibility   Methicillin resistant staphylococcus aureus - MIC*     CIPROFLOXACIN >=8 RESISTANT Resistant     GENTAMICIN <=0.5 SENSITIVE Sensitive     NITROFURANTOIN <=16 SENSITIVE Sensitive     OXACILLIN >=4 RESISTANT Resistant     TETRACYCLINE >=16 RESISTANT Resistant     VANCOMYCIN 1 SENSITIVE Sensitive     TRIMETH/SULFA >=320 RESISTANT Resistant     CLINDAMYCIN <=0.25 SENSITIVE Sensitive     RIFAMPIN <=0.5 SENSITIVE Sensitive     Inducible Clindamycin NEGATIVE Sensitive     * >=100,000 COLONIES/mL METHICILLIN RESISTANT STAPHYLOCOCCUS AUREUS  Blood Culture (routine x 2)     Status: None (Preliminary result)   Collection Time: 01/27/22  7:00 PM   Specimen: BLOOD  Result Value Ref Range Status   Specimen Description BLOOD BLOOD LEFT ARM  Final   Special Requests   Final    BOTTLES DRAWN AEROBIC AND ANAEROBIC Blood Culture adequate volume   Culture   Final    NO GROWTH 4 DAYS Performed at [facility], [address], [place], [place] [address]    Report Status PENDING  Incomplete    Today   Subjective    Terrance Miller today has no new complaints  No fever  Or chills   No Nausea, Vomiting or Diarrhea -- Oral intake is fair          Patient has been seen and examined prior to discharge   Objective   Blood pressure 132/64, pulse 62, temperature 98.2 F (36.8 C), resp. rate 18, height '5\' 6"'$  (1.676 m), weight 84.7 kg, SpO2 98 %.   Intake/Output Summary (Last 24 hours) at 01/31/2022 1510 Last data filed at 01/31/2022 0500 Gross per 24 hour  Intake 350 ml  Output 675 ml  Net -325 ml    Exam Gen:- Awake Alert, no acute distress  HEENT:- [place].AT, No sclera icterus, HOH Neck-Supple Neck,No JVD,.  Lungs-  CTAB , good air movement bilaterally CV- S1, S2 normal, regular Abd-  +ve B.Sounds, Abd Soft, No tenderness,    Extremity-  good pulses Psych-affect is appropriate, oriented x3 Neuro-generalized weakness, ambulatory dysfunction, no new focal deficits, no tremors  GU--Foley in situ, scrotal swelling noted, also scrotal and penile wounds  are hemostatic without purulent drainage -    Media Information  Document Information  Photos  Posterior scrotal wound  01/30/2022 13:06  Attached To:  Hospital Encounter on 01/27/22  Source Information  Roxan Hockey, MD  [facility]    Media Information  Document Information  Photos  Enlarged scrotum  01/30/2022 13:08  Attached To:  Hospital Encounter on 01/27/22  Source Information  Roxan Hockey, MD  [facility]     Media Information  Document Information  Photos  Anterior Penile/scrotal wound  01/30/2022 13:07  Attached To:  Hospital Encounter on 01/27/22  Source Information  Roxan Hockey, MD  [facility]     Data Review   CBC w Diff:  Lab Results  Component Value Date   WBC 4.1 01/31/2022   HGB 8.6 (L) 01/31/2022   HGB 9.7 (L) 11/04/2021   HCT 25.9 (L) 01/31/2022   HCT 28.6 (L) 11/04/2021   PLT 172 01/31/2022   PLT 124 (L) 11/04/2021   LYMPHOPCT 29 01/28/2022   MONOPCT 11 01/28/2022   EOSPCT 4 01/28/2022  BASOPCT 1 01/28/2022    CMP:  Lab Results  Component Value Date   NA 139 01/29/2022   NA 150 (H) 11/04/2021   K 4.5 01/29/2022   CL 114 (H) 01/29/2022   CO2 21 (L) 01/29/2022   BUN 12 01/29/2022   BUN 16 11/04/2021   CREATININE 1.08 01/29/2022   CREATININE 1.26 09/28/2013   PROT 5.9 (L) 01/28/2022   PROT 6.5 11/04/2021   ALBUMIN 2.4 (L) 01/28/2022   ALBUMIN 3.5 (L) 11/04/2021   BILITOT 0.7 01/28/2022   BILITOT 1.2 11/04/2021   ALKPHOS 48 01/28/2022   AST 19 01/28/2022   ALT 9 01/28/2022  .  Total Discharge time is about 33 minutes  Roxan Hockey M.D on 01/31/2022 at 3:10 PM  Go to [email or web address] -  for contact info  [facility] - Office  [phone]

## 2022-01-31 NOTE — Care Management Important Message (Signed)
Important Message  Patient Details  Name: Terrance Miller MRN: [id number] Date of Birth: 01/25/1928   Medicare Important Message Given:  Yes     [name] 01/31/2022, 1:57 PM

## 2022-01-31 NOTE — Discharge Instructions (Addendum)
1)Scrotal Wound Care:- Wash wound with soap and water, pat dry Apply single layer of xeroform gauze, top with foam Change every other day.   2)Routine Foley catheter care  3)Please follow-up with Urologist Dr. Alyson Ingles in about --- in 1 to 2 weeks for recheck and follow-up evaluation  in his office----[facility], [address] [place], [place] [address] Phone Number----[phone]  4)Please Complete Linezolid/Zyvox antibiotic as prescribed  5)Please Repeat CBC and BMP tests in 1 week

## 2022-02-01 LAB — CULTURE, BLOOD (ROUTINE X 2)
Culture: NO GROWTH
Culture: NO GROWTH
Special Requests: ADEQUATE
Special Requests: ADEQUATE

## 2022-02-03 ENCOUNTER — Encounter (INDEPENDENT_AMBULATORY_CARE_PROVIDER_SITE_OTHER): Payer: Self-pay | Admitting: *Deleted

## 2022-02-03 ENCOUNTER — Telehealth: Payer: Self-pay

## 2022-02-03 DIAGNOSIS — R338 Other retention of urine: Secondary | ICD-10-CM | POA: Diagnosis not present

## 2022-02-03 DIAGNOSIS — D472 Monoclonal gammopathy: Secondary | ICD-10-CM | POA: Diagnosis not present

## 2022-02-03 DIAGNOSIS — K4020 Bilateral inguinal hernia, without obstruction or gangrene, not specified as recurrent: Secondary | ICD-10-CM | POA: Diagnosis not present

## 2022-02-03 DIAGNOSIS — D539 Nutritional anemia, unspecified: Secondary | ICD-10-CM | POA: Diagnosis not present

## 2022-02-03 DIAGNOSIS — E785 Hyperlipidemia, unspecified: Secondary | ICD-10-CM | POA: Diagnosis not present

## 2022-02-03 DIAGNOSIS — E1122 Type 2 diabetes mellitus with diabetic chronic kidney disease: Secondary | ICD-10-CM | POA: Diagnosis not present

## 2022-02-03 DIAGNOSIS — N1832 Chronic kidney disease, stage 3b: Secondary | ICD-10-CM | POA: Diagnosis not present

## 2022-02-03 DIAGNOSIS — H409 Unspecified glaucoma: Secondary | ICD-10-CM | POA: Diagnosis not present

## 2022-02-03 DIAGNOSIS — F03918 Unspecified dementia, unspecified severity, with other behavioral disturbance: Secondary | ICD-10-CM | POA: Diagnosis not present

## 2022-02-03 DIAGNOSIS — I252 Old myocardial infarction: Secondary | ICD-10-CM | POA: Diagnosis not present

## 2022-02-03 DIAGNOSIS — I129 Hypertensive chronic kidney disease with stage 1 through stage 4 chronic kidney disease, or unspecified chronic kidney disease: Secondary | ICD-10-CM | POA: Diagnosis not present

## 2022-02-03 DIAGNOSIS — K5909 Other constipation: Secondary | ICD-10-CM | POA: Diagnosis not present

## 2022-02-03 DIAGNOSIS — T8131XA Disruption of external operation (surgical) wound, not elsewhere classified, initial encounter: Secondary | ICD-10-CM | POA: Diagnosis not present

## 2022-02-03 DIAGNOSIS — Z7982 Long term (current) use of aspirin: Secondary | ICD-10-CM | POA: Diagnosis not present

## 2022-02-03 DIAGNOSIS — N390 Urinary tract infection, site not specified: Secondary | ICD-10-CM | POA: Diagnosis not present

## 2022-02-03 DIAGNOSIS — K5289 Other specified noninfective gastroenteritis and colitis: Secondary | ICD-10-CM | POA: Diagnosis not present

## 2022-02-03 DIAGNOSIS — H9190 Unspecified hearing loss, unspecified ear: Secondary | ICD-10-CM | POA: Diagnosis not present

## 2022-02-03 DIAGNOSIS — I2510 Atherosclerotic heart disease of native coronary artery without angina pectoris: Secondary | ICD-10-CM | POA: Diagnosis not present

## 2022-02-03 DIAGNOSIS — T83511A Infection and inflammatory reaction due to indwelling urethral catheter, initial encounter: Secondary | ICD-10-CM | POA: Diagnosis not present

## 2022-02-03 NOTE — Patient Outreach (Signed)
  Care Coordination [facility] Note Transition Care Management Follow-up Telephone Call Date of discharge and from where: 01/31/22 [facility] dx stercoral colitis, UTI How have you been since you were released from the hospital? Spoke with daughter Jackelyn Hoehn States he is doing better.  States having no issues with his catheter and his wound is healing  Any questions or concerns? Yes States they might need to get a ramp installed in the future but he was able to go up three stairs to get into their home.  Items Reviewed: Did the pt receive and understand the discharge instructions provided? Yes  Medications obtained and verified? Yes  Other? Yes  wound care Any new allergies since your discharge? No  Dietary orders reviewed? Yes reviewed higher protein for wound healing Do you have support at home? Yes  daughter is caregiver  Home Care and Equipment/Supplies: Were home health services ordered? yes If so, what is the name of the agency? [facility]  Has the agency set up a time to come to the patient's home? Yes coming today 02/03/22 Were any new equipment or medical supplies ordered?  No What is the name of the medical supply agency? N/a Were you able to get the supplies/equipment? not applicable Do you have any questions related to the use of the equipment or supplies? No  Functional Questionnaire: (I = Independent and D = Dependent) ADLs: D  Bathing/Dressing- D  Meal Prep- D  Eating- D  Maintaining continence- D  Transferring/Ambulation- D  Managing Meds- D  Follow up appointments reviewed:  PCP Hospital f/u appt confirmed? Yes  Scheduled to see Dr. Lacinda Axon on 02/04/22 @ 2:50 PM. [facility] f/u appt confirmed? No  Scheduled to see Dr. Alyson Ingles urology on  @ daughter is calling today to schedule . Are transportation arrangements needed? No  If their condition worsens, is the pt aware to call PCP or go to the Emergency Dept.? Yes Was the patient provided with contact  information for the PCP's office or ED? Yes Was to pt encouraged to call back with questions or concerns? Yes  SDOH assessments and interventions completed:   Yes SDOH Interventions Today    Flowsheet Row Most Recent Value  SDOH Interventions   Food Insecurity Interventions Intervention Not Indicated  Transportation Interventions Intervention Not Indicated       Care Coordination Interventions:  Referred for Care Coordination Services:  RN Care Coordinator Reviewed wound care, foley care.  Reviewed diet to promote wound healing.      Encounter Outcome:  Pt. Visit Completed   Peter Garter RN, BSN,CCM, CDE Care Management Coordinator [facility] [phone]

## 2022-02-04 ENCOUNTER — Ambulatory Visit (INDEPENDENT_AMBULATORY_CARE_PROVIDER_SITE_OTHER): Payer: Medicare Other | Admitting: Family Medicine

## 2022-02-04 VITALS — BP 109/73 | Ht 66.0 in

## 2022-02-04 DIAGNOSIS — T83511D Infection and inflammatory reaction due to indwelling urethral catheter, subsequent encounter: Secondary | ICD-10-CM | POA: Diagnosis not present

## 2022-02-04 DIAGNOSIS — L853 Xerosis cutis: Secondary | ICD-10-CM | POA: Diagnosis not present

## 2022-02-04 DIAGNOSIS — N390 Urinary tract infection, site not specified: Secondary | ICD-10-CM | POA: Diagnosis not present

## 2022-02-04 DIAGNOSIS — D539 Nutritional anemia, unspecified: Secondary | ICD-10-CM

## 2022-02-04 DIAGNOSIS — K5289 Other specified noninfective gastroenteritis and colitis: Secondary | ICD-10-CM | POA: Diagnosis not present

## 2022-02-04 MED ORDER — CLOBETASOL PROPIONATE 0.05 % EX OINT: 1.0000 | TOPICAL_OINTMENT | Freq: Every day | CUTANEOUS | 0 refills | Status: DC

## 2022-02-04 NOTE — Patient Instructions (Signed)
Finish antibiotic course.  Keep wound clean.  Follow up with Urology.

## 2022-02-05 ENCOUNTER — Other Ambulatory Visit: Payer: Self-pay | Admitting: Physician Assistant

## 2022-02-05 DIAGNOSIS — L853 Xerosis cutis: Secondary | ICD-10-CM | POA: Insufficient documentation

## 2022-02-05 DIAGNOSIS — D539 Nutritional anemia, unspecified: Secondary | ICD-10-CM

## 2022-02-05 DIAGNOSIS — D472 Monoclonal gammopathy: Secondary | ICD-10-CM

## 2022-02-05 NOTE — Progress Notes (Addendum)
Patient's family has decided that they do not wish to pursue bone marrow biopsy (see Telephone Encounter by RN Charlyne Petrin on 01/28/2022), despite high likelihood that patient has some sort of bone marrow infiltrative process such as SPEP, myeloma, or other lymphoproliferative disorder.  He continues to have worsening anemia with most recent Hgb 8.6 as of 01/31/2022.  Patient has been scheduled for office visit in next available opening, which unfortunately was not until 03/03/2022 due to patient insistence on appointments only at 3:30 PM time slot.  At follow-up visit, we will discuss options for supportive care that are available without need to pursue biopsy.  Patient may benefit from Retacrit injections as his CKD may be playing some role in his anemia, and Retacrit injections can also be used "off label" for high clinical suspicion of MDS.  He would also be eligible for blood transfusions as needed.  We will discuss this as well as the patient's overall goals of care in detail at his upcoming visit.  In the meantime, we will schedule patient for CBC + BB sample every 2 weeks.

## 2022-02-05 NOTE — Assessment & Plan Note (Signed)
Patient has extensive scale and thick plaques on his lower extremities.  Clobetasol as directed.  Advised aggressive exfoliation.
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
## 2022-02-05 NOTE — Assessment & Plan Note (Signed)
No abdominal pain at this time.

## 2022-02-05 NOTE — Assessment & Plan Note (Signed)
Needs close follow-up with hematology.

## 2022-02-05 NOTE — Assessment & Plan Note (Signed)
Advise follow-up with urology.  This is already been scheduled.  Continue Zyvox.

## 2022-02-06 ENCOUNTER — Telehealth: Payer: Self-pay | Admitting: Family Medicine

## 2022-02-06 NOTE — Telephone Encounter (Signed)
Left message to return call 

## 2022-02-06 NOTE — Telephone Encounter (Signed)
[facility] calling to receive verbal orders for wound care 3/week for 8 weeks. Pt has wound on scrotum/penis. Pt also has foley catheter. Also requesting one prn visit. Please advise. Thank you  [phone]

## 2022-02-07 NOTE — Telephone Encounter (Signed)
Home health returned call and verbalized understanding

## 2022-02-10 ENCOUNTER — Inpatient Hospital Stay: Payer: Medicare Other | Attending: Hematology

## 2022-02-10 DIAGNOSIS — I2510 Atherosclerotic heart disease of native coronary artery without angina pectoris: Secondary | ICD-10-CM | POA: Diagnosis not present

## 2022-02-10 DIAGNOSIS — K4020 Bilateral inguinal hernia, without obstruction or gangrene, not specified as recurrent: Secondary | ICD-10-CM | POA: Diagnosis not present

## 2022-02-10 DIAGNOSIS — E785 Hyperlipidemia, unspecified: Secondary | ICD-10-CM | POA: Diagnosis not present

## 2022-02-10 DIAGNOSIS — T83511A Infection and inflammatory reaction due to indwelling urethral catheter, initial encounter: Secondary | ICD-10-CM | POA: Diagnosis not present

## 2022-02-10 DIAGNOSIS — H9190 Unspecified hearing loss, unspecified ear: Secondary | ICD-10-CM | POA: Diagnosis not present

## 2022-02-10 DIAGNOSIS — Z7982 Long term (current) use of aspirin: Secondary | ICD-10-CM | POA: Diagnosis not present

## 2022-02-10 DIAGNOSIS — D539 Nutritional anemia, unspecified: Secondary | ICD-10-CM | POA: Diagnosis not present

## 2022-02-10 DIAGNOSIS — N1832 Chronic kidney disease, stage 3b: Secondary | ICD-10-CM | POA: Diagnosis not present

## 2022-02-10 DIAGNOSIS — I129 Hypertensive chronic kidney disease with stage 1 through stage 4 chronic kidney disease, or unspecified chronic kidney disease: Secondary | ICD-10-CM | POA: Diagnosis not present

## 2022-02-10 DIAGNOSIS — F03918 Unspecified dementia, unspecified severity, with other behavioral disturbance: Secondary | ICD-10-CM | POA: Diagnosis not present

## 2022-02-10 DIAGNOSIS — I252 Old myocardial infarction: Secondary | ICD-10-CM | POA: Diagnosis not present

## 2022-02-10 DIAGNOSIS — T8131XA Disruption of external operation (surgical) wound, not elsewhere classified, initial encounter: Secondary | ICD-10-CM | POA: Diagnosis not present

## 2022-02-10 DIAGNOSIS — E1122 Type 2 diabetes mellitus with diabetic chronic kidney disease: Secondary | ICD-10-CM | POA: Diagnosis not present

## 2022-02-10 DIAGNOSIS — N390 Urinary tract infection, site not specified: Secondary | ICD-10-CM | POA: Diagnosis not present

## 2022-02-10 DIAGNOSIS — D472 Monoclonal gammopathy: Secondary | ICD-10-CM | POA: Diagnosis not present

## 2022-02-10 DIAGNOSIS — K5909 Other constipation: Secondary | ICD-10-CM | POA: Diagnosis not present

## 2022-02-10 DIAGNOSIS — R338 Other retention of urine: Secondary | ICD-10-CM | POA: Diagnosis not present

## 2022-02-10 DIAGNOSIS — K5289 Other specified noninfective gastroenteritis and colitis: Secondary | ICD-10-CM | POA: Diagnosis not present

## 2022-02-10 DIAGNOSIS — H409 Unspecified glaucoma: Secondary | ICD-10-CM | POA: Diagnosis not present

## 2022-02-11 ENCOUNTER — Telehealth: Payer: Self-pay | Admitting: Family Medicine

## 2022-02-11 ENCOUNTER — Encounter: Payer: Self-pay | Admitting: Urology

## 2022-02-11 ENCOUNTER — Ambulatory Visit (INDEPENDENT_AMBULATORY_CARE_PROVIDER_SITE_OTHER): Payer: Medicare Other | Admitting: Urology

## 2022-02-11 VITALS — BP 163/79 | HR 67

## 2022-02-11 DIAGNOSIS — T8131XD Disruption of external operation (surgical) wound, not elsewhere classified, subsequent encounter: Secondary | ICD-10-CM

## 2022-02-11 DIAGNOSIS — N5089 Other specified disorders of the male genital organs: Secondary | ICD-10-CM

## 2022-02-11 DIAGNOSIS — R339 Retention of urine, unspecified: Secondary | ICD-10-CM

## 2022-02-11 DIAGNOSIS — N433 Hydrocele, unspecified: Secondary | ICD-10-CM

## 2022-02-11 NOTE — Telephone Encounter (Signed)
Joey from [place] calling in to get verbal orders for Physical Therapy Plan of Care; once a week for 8 weeks. They will be working on transferring, mobility and educations. Please advise. Thank you.  [phone](KKX leave detailed message)

## 2022-02-11 NOTE — Telephone Encounter (Signed)
Left detailed message on Joey secure voicemail.

## 2022-02-11 NOTE — Progress Notes (Signed)
History of Present Illness: This 89 year old male presents for follow-up of recent hospitalization for catheter related urinary tract infection.  Urologic history dates back to November 2023.  He underwent bilateral hydrocelectomy by Dr. Felipa Eth.  The patient developed wound dehiscence which has been treated with dressing changes since that time.  He has an indwelling Foley catheter.  Urologic consultation was requested 2 weeks ago for follow-up of his Foley catheter as well as catheter change.  That was performed by myself while in the hospital over guidewire.  The patient is bedbound.  He does have a healing scrotal wound.  I felt that it was best leaving a catheter in at least in the near future because of the above.  His daughter [name] accompanies him today.  No recent catheter problems.  She states that his wound is much smaller, perhaps 1 inch in length.      Past Medical History:  Diagnosis Date   Acute ST elevation myocardial infarction (STEMI) of inferior wall ([place]) 2004   Coronary artery disease    DES x2 to the RCA 2004, residual disease managed medically   Hyperlipidemia    Hypertension    Type 2 diabetes mellitus ([place])     Past Surgical History:  Procedure Laterality Date   BIOPSY  01/28/2022   Procedure: BIOPSY;  Surgeon: Montez Morita, Quillian Quince, MD;  [facility];  Service: Gastroenterology;;   CATARACT EXTRACTION Bilateral    CORONARY ANGIOPLASTY WITH STENT PLACEMENT  09/05/2002   stent RCA, 80% first diagonal, 70-80% mid-diagonal, 80% mid LAD stenosis   FLEXIBLE SIGMOIDOSCOPY N/A 01/28/2022   Procedure: FLEXIBLE SIGMOIDOSCOPY;  Surgeon: Harvel Quale, MD;  [facility];  Service: Gastroenterology;  Laterality: N/A;   HYDROCELE EXCISION Bilateral 11/20/2021   Procedure: HYDROCELECTOMY ADULT;  Surgeon: Primus Bravo., MD;  Location: [facility];  Service: Urology;  Laterality: Bilateral;   IMPACTION REMOVAL  01/28/2022    Procedure: IMPACTION REMOVAL;  Surgeon: Harvel Quale, MD;  [facility];  Service: Gastroenterology;;   [place]  11/01/2008   Normal    Home Medications:  Allergies as of 02/11/2022   No Known Allergies      Medication List        Accurate as of February 11, 2022  7:52 AM. If you have any questions, ask your nurse or doctor.          amLODipine 5 MG tablet Commonly known as: NORVASC Take 1 tablet (5 mg total) by mouth daily.   aspirin EC 81 MG tablet Take 1 tablet (81 mg total) by mouth daily with breakfast. Swallow whole.   bisacodyl 10 MG suppository Commonly known as: DULCOLAX Place 1 suppository (10 mg total) rectally every Monday, Wednesday, and Friday.   carvedilol 6.25 MG tablet Commonly known as: COREG Take 1 tablet (6.25 mg total) by mouth 2 (two) times daily.   cholecalciferol 1000 units tablet Commonly known as: VITAMIN D Take 1,000 Units by mouth daily.   clobetasol ointment 0.05 % Commonly known as: TEMOVATE Apply 1 Application topically daily.   cyanocobalamin 1000 MCG tablet Commonly known as: VITAMIN B12 Take 1,000 mcg by mouth daily.   folic acid 1 MG tablet Commonly known as: FOLVITE Take 1 tablet (1 mg total) by mouth daily.   hydrocortisone 25 MG suppository Commonly known as: ANUSOL-HC Place 1 suppository (25 mg total) rectally every Tuesday, Thursday, Saturday, and Sunday.   linezolid 600 MG tablet Commonly known  as: ZYVOX Take 1 tablet (600 mg total) by mouth every 12 (twelve) hours.   polyethylene glycol 17 g packet Commonly known as: MIRALAX / GLYCOLAX Take 17 g by mouth 2 (two) times daily.   tamsulosin 0.4 MG Caps capsule Commonly known as: FLOMAX Take 0.4 mg by mouth daily.   Travatan Z 0.004 % Soln ophthalmic solution Generic drug: Travoprost (BAK Free) Place 1 drop into both eyes 2 (two) times daily.        Allergies: No Known Allergies  Family History  Problem Relation  Age of Onset   Hypertension Father    Diabetes Sister     Social History:  reports that he quit smoking about 16 years ago. His smoking use included cigarettes. He smoked an average of .25 packs per day. He has never used smokeless tobacco. He reports that he does not currently use alcohol. He reports that he does not use drugs.  ROS: A complete review of systems was performed.  All systems are negative except for pertinent findings as noted.  Physical Exam:  Vital signs in last 24 hours: There were no vitals taken for this visit. Constitutional:  Alert and oriented, No acute distress.  Patient in wheelchair Cardiovascular: Regular rate  Respiratory: Normal respiratory effort Psychiatric: Normal mood and affect  I have reviewed prior pt notes  I have reviewed notes from referring/previous physicians--Hospital notes  I have reviewed prior urine culture-MRSA  Recent CT scan results reviewed   Impression/Assessment:  Wound dehiscence after bilateral hydrocelectomy, improving  Urinary retention-patient currently nonambulatory, will leave catheter in  Plan:  I will have him come back in 2 weeks.  If he is more ambulatory and or his scrotal incision is well-healed, consider T OV  If nonambulatory or still with open wound, he will have his catheter changed over wire at that time.

## 2022-02-14 DIAGNOSIS — Z7982 Long term (current) use of aspirin: Secondary | ICD-10-CM | POA: Diagnosis not present

## 2022-02-14 DIAGNOSIS — K5909 Other constipation: Secondary | ICD-10-CM | POA: Diagnosis not present

## 2022-02-14 DIAGNOSIS — E1122 Type 2 diabetes mellitus with diabetic chronic kidney disease: Secondary | ICD-10-CM | POA: Diagnosis not present

## 2022-02-14 DIAGNOSIS — I2510 Atherosclerotic heart disease of native coronary artery without angina pectoris: Secondary | ICD-10-CM | POA: Diagnosis not present

## 2022-02-14 DIAGNOSIS — N390 Urinary tract infection, site not specified: Secondary | ICD-10-CM | POA: Diagnosis not present

## 2022-02-14 DIAGNOSIS — H9190 Unspecified hearing loss, unspecified ear: Secondary | ICD-10-CM | POA: Diagnosis not present

## 2022-02-14 DIAGNOSIS — K5289 Other specified noninfective gastroenteritis and colitis: Secondary | ICD-10-CM | POA: Diagnosis not present

## 2022-02-14 DIAGNOSIS — H409 Unspecified glaucoma: Secondary | ICD-10-CM | POA: Diagnosis not present

## 2022-02-14 DIAGNOSIS — D472 Monoclonal gammopathy: Secondary | ICD-10-CM | POA: Diagnosis not present

## 2022-02-14 DIAGNOSIS — E785 Hyperlipidemia, unspecified: Secondary | ICD-10-CM | POA: Diagnosis not present

## 2022-02-14 DIAGNOSIS — I129 Hypertensive chronic kidney disease with stage 1 through stage 4 chronic kidney disease, or unspecified chronic kidney disease: Secondary | ICD-10-CM | POA: Diagnosis not present

## 2022-02-14 DIAGNOSIS — K4020 Bilateral inguinal hernia, without obstruction or gangrene, not specified as recurrent: Secondary | ICD-10-CM | POA: Diagnosis not present

## 2022-02-14 DIAGNOSIS — T8131XA Disruption of external operation (surgical) wound, not elsewhere classified, initial encounter: Secondary | ICD-10-CM | POA: Diagnosis not present

## 2022-02-14 DIAGNOSIS — D539 Nutritional anemia, unspecified: Secondary | ICD-10-CM | POA: Diagnosis not present

## 2022-02-14 DIAGNOSIS — F03918 Unspecified dementia, unspecified severity, with other behavioral disturbance: Secondary | ICD-10-CM | POA: Diagnosis not present

## 2022-02-14 DIAGNOSIS — I252 Old myocardial infarction: Secondary | ICD-10-CM | POA: Diagnosis not present

## 2022-02-14 DIAGNOSIS — T83511A Infection and inflammatory reaction due to indwelling urethral catheter, initial encounter: Secondary | ICD-10-CM | POA: Diagnosis not present

## 2022-02-14 DIAGNOSIS — R338 Other retention of urine: Secondary | ICD-10-CM | POA: Diagnosis not present

## 2022-02-14 DIAGNOSIS — N1832 Chronic kidney disease, stage 3b: Secondary | ICD-10-CM | POA: Diagnosis not present

## 2022-02-17 ENCOUNTER — Ambulatory Visit: Payer: Self-pay | Admitting: *Deleted

## 2022-02-17 DIAGNOSIS — K5289 Other specified noninfective gastroenteritis and colitis: Secondary | ICD-10-CM | POA: Diagnosis not present

## 2022-02-17 DIAGNOSIS — E1122 Type 2 diabetes mellitus with diabetic chronic kidney disease: Secondary | ICD-10-CM | POA: Diagnosis not present

## 2022-02-17 DIAGNOSIS — Z7982 Long term (current) use of aspirin: Secondary | ICD-10-CM | POA: Diagnosis not present

## 2022-02-17 DIAGNOSIS — D472 Monoclonal gammopathy: Secondary | ICD-10-CM | POA: Diagnosis not present

## 2022-02-17 DIAGNOSIS — I2510 Atherosclerotic heart disease of native coronary artery without angina pectoris: Secondary | ICD-10-CM | POA: Diagnosis not present

## 2022-02-17 DIAGNOSIS — I252 Old myocardial infarction: Secondary | ICD-10-CM | POA: Diagnosis not present

## 2022-02-17 DIAGNOSIS — K5909 Other constipation: Secondary | ICD-10-CM | POA: Diagnosis not present

## 2022-02-17 DIAGNOSIS — E785 Hyperlipidemia, unspecified: Secondary | ICD-10-CM | POA: Diagnosis not present

## 2022-02-17 DIAGNOSIS — H9190 Unspecified hearing loss, unspecified ear: Secondary | ICD-10-CM | POA: Diagnosis not present

## 2022-02-17 DIAGNOSIS — T83511A Infection and inflammatory reaction due to indwelling urethral catheter, initial encounter: Secondary | ICD-10-CM | POA: Diagnosis not present

## 2022-02-17 DIAGNOSIS — K4020 Bilateral inguinal hernia, without obstruction or gangrene, not specified as recurrent: Secondary | ICD-10-CM | POA: Diagnosis not present

## 2022-02-17 DIAGNOSIS — H409 Unspecified glaucoma: Secondary | ICD-10-CM | POA: Diagnosis not present

## 2022-02-17 DIAGNOSIS — F03918 Unspecified dementia, unspecified severity, with other behavioral disturbance: Secondary | ICD-10-CM | POA: Diagnosis not present

## 2022-02-17 DIAGNOSIS — D539 Nutritional anemia, unspecified: Secondary | ICD-10-CM | POA: Diagnosis not present

## 2022-02-17 DIAGNOSIS — N1832 Chronic kidney disease, stage 3b: Secondary | ICD-10-CM | POA: Diagnosis not present

## 2022-02-17 DIAGNOSIS — I129 Hypertensive chronic kidney disease with stage 1 through stage 4 chronic kidney disease, or unspecified chronic kidney disease: Secondary | ICD-10-CM | POA: Diagnosis not present

## 2022-02-17 DIAGNOSIS — N390 Urinary tract infection, site not specified: Secondary | ICD-10-CM | POA: Diagnosis not present

## 2022-02-17 DIAGNOSIS — T8131XA Disruption of external operation (surgical) wound, not elsewhere classified, initial encounter: Secondary | ICD-10-CM | POA: Diagnosis not present

## 2022-02-17 DIAGNOSIS — R338 Other retention of urine: Secondary | ICD-10-CM | POA: Diagnosis not present

## 2022-02-17 NOTE — Patient Outreach (Signed)
  Care Coordination   02/17/2022 Name: Terrance Miller MRN: [id number] DOB: [birth year]   Care Coordination Outreach Attempts:  An unsuccessful telephone outreach was attempted today to offer the patient information about available care coordination services as a benefit of their health plan.   Follow Up Plan:  Additional outreach attempts will be made to offer the patient care coordination information and services.   Encounter Outcome:  No Answer   Care Coordination Interventions:  No, not indicated     Jamille Fisher L. Lavina Hamman, RN, BSN, [place] Coordinator Office number [phone]

## 2022-02-17 NOTE — Patient Outreach (Signed)
  Care Coordination   02/17/2022 Name: Terrance Miller MRN: [id number] DOB: [birth year]   Care Coordination Outreach Attempts:  An unsuccessful telephone outreach was attempted today to offer the patient information about available care coordination services as a benefit of their health plan.  Used MD code  Follow Up Plan:  No further outreach attempts will be made at this time. We have been unable to contact the patient to offer or enroll patient in care coordination services  Encounter Outcome:  No Answer   Care Coordination Interventions:  No, not indicated     Kortlynn Poust L. Lavina Hamman, RN, BSN, [place] Coordinator Office number [phone]

## 2022-02-19 DIAGNOSIS — K5289 Other specified noninfective gastroenteritis and colitis: Secondary | ICD-10-CM | POA: Diagnosis not present

## 2022-02-19 DIAGNOSIS — T83511A Infection and inflammatory reaction due to indwelling urethral catheter, initial encounter: Secondary | ICD-10-CM | POA: Diagnosis not present

## 2022-02-19 DIAGNOSIS — K5909 Other constipation: Secondary | ICD-10-CM | POA: Diagnosis not present

## 2022-02-19 DIAGNOSIS — Z7982 Long term (current) use of aspirin: Secondary | ICD-10-CM | POA: Diagnosis not present

## 2022-02-19 DIAGNOSIS — E785 Hyperlipidemia, unspecified: Secondary | ICD-10-CM | POA: Diagnosis not present

## 2022-02-19 DIAGNOSIS — E1122 Type 2 diabetes mellitus with diabetic chronic kidney disease: Secondary | ICD-10-CM | POA: Diagnosis not present

## 2022-02-19 DIAGNOSIS — H409 Unspecified glaucoma: Secondary | ICD-10-CM | POA: Diagnosis not present

## 2022-02-19 DIAGNOSIS — I2510 Atherosclerotic heart disease of native coronary artery without angina pectoris: Secondary | ICD-10-CM | POA: Diagnosis not present

## 2022-02-19 DIAGNOSIS — D539 Nutritional anemia, unspecified: Secondary | ICD-10-CM | POA: Diagnosis not present

## 2022-02-19 DIAGNOSIS — N1832 Chronic kidney disease, stage 3b: Secondary | ICD-10-CM | POA: Diagnosis not present

## 2022-02-19 DIAGNOSIS — I129 Hypertensive chronic kidney disease with stage 1 through stage 4 chronic kidney disease, or unspecified chronic kidney disease: Secondary | ICD-10-CM | POA: Diagnosis not present

## 2022-02-19 DIAGNOSIS — D472 Monoclonal gammopathy: Secondary | ICD-10-CM | POA: Diagnosis not present

## 2022-02-19 DIAGNOSIS — R338 Other retention of urine: Secondary | ICD-10-CM | POA: Diagnosis not present

## 2022-02-19 DIAGNOSIS — I252 Old myocardial infarction: Secondary | ICD-10-CM | POA: Diagnosis not present

## 2022-02-19 DIAGNOSIS — K4020 Bilateral inguinal hernia, without obstruction or gangrene, not specified as recurrent: Secondary | ICD-10-CM | POA: Diagnosis not present

## 2022-02-19 DIAGNOSIS — H9190 Unspecified hearing loss, unspecified ear: Secondary | ICD-10-CM | POA: Diagnosis not present

## 2022-02-19 DIAGNOSIS — T8131XA Disruption of external operation (surgical) wound, not elsewhere classified, initial encounter: Secondary | ICD-10-CM | POA: Diagnosis not present

## 2022-02-19 DIAGNOSIS — N390 Urinary tract infection, site not specified: Secondary | ICD-10-CM | POA: Diagnosis not present

## 2022-02-19 DIAGNOSIS — F03918 Unspecified dementia, unspecified severity, with other behavioral disturbance: Secondary | ICD-10-CM | POA: Diagnosis not present

## 2022-02-21 ENCOUNTER — Telehealth: Payer: Self-pay | Admitting: *Deleted

## 2022-02-21 DIAGNOSIS — F03918 Unspecified dementia, unspecified severity, with other behavioral disturbance: Secondary | ICD-10-CM | POA: Diagnosis not present

## 2022-02-21 DIAGNOSIS — E785 Hyperlipidemia, unspecified: Secondary | ICD-10-CM | POA: Diagnosis not present

## 2022-02-21 DIAGNOSIS — H9190 Unspecified hearing loss, unspecified ear: Secondary | ICD-10-CM | POA: Diagnosis not present

## 2022-02-21 DIAGNOSIS — R338 Other retention of urine: Secondary | ICD-10-CM | POA: Diagnosis not present

## 2022-02-21 DIAGNOSIS — E1122 Type 2 diabetes mellitus with diabetic chronic kidney disease: Secondary | ICD-10-CM | POA: Diagnosis not present

## 2022-02-21 DIAGNOSIS — I129 Hypertensive chronic kidney disease with stage 1 through stage 4 chronic kidney disease, or unspecified chronic kidney disease: Secondary | ICD-10-CM | POA: Diagnosis not present

## 2022-02-21 DIAGNOSIS — I2510 Atherosclerotic heart disease of native coronary artery without angina pectoris: Secondary | ICD-10-CM | POA: Diagnosis not present

## 2022-02-21 DIAGNOSIS — Z7982 Long term (current) use of aspirin: Secondary | ICD-10-CM | POA: Diagnosis not present

## 2022-02-21 DIAGNOSIS — T83511A Infection and inflammatory reaction due to indwelling urethral catheter, initial encounter: Secondary | ICD-10-CM | POA: Diagnosis not present

## 2022-02-21 DIAGNOSIS — D539 Nutritional anemia, unspecified: Secondary | ICD-10-CM | POA: Diagnosis not present

## 2022-02-21 DIAGNOSIS — T8131XA Disruption of external operation (surgical) wound, not elsewhere classified, initial encounter: Secondary | ICD-10-CM | POA: Diagnosis not present

## 2022-02-21 DIAGNOSIS — K4020 Bilateral inguinal hernia, without obstruction or gangrene, not specified as recurrent: Secondary | ICD-10-CM | POA: Diagnosis not present

## 2022-02-21 DIAGNOSIS — D472 Monoclonal gammopathy: Secondary | ICD-10-CM | POA: Diagnosis not present

## 2022-02-21 DIAGNOSIS — H409 Unspecified glaucoma: Secondary | ICD-10-CM | POA: Diagnosis not present

## 2022-02-21 DIAGNOSIS — K5909 Other constipation: Secondary | ICD-10-CM | POA: Diagnosis not present

## 2022-02-21 DIAGNOSIS — N1832 Chronic kidney disease, stage 3b: Secondary | ICD-10-CM | POA: Diagnosis not present

## 2022-02-21 DIAGNOSIS — K5289 Other specified noninfective gastroenteritis and colitis: Secondary | ICD-10-CM | POA: Diagnosis not present

## 2022-02-21 DIAGNOSIS — N390 Urinary tract infection, site not specified: Secondary | ICD-10-CM | POA: Diagnosis not present

## 2022-02-21 DIAGNOSIS — I252 Old myocardial infarction: Secondary | ICD-10-CM | POA: Diagnosis not present

## 2022-02-21 NOTE — Telephone Encounter (Signed)
Terrance Salt G, DO     If he is altered he needs to go to the ER.

## 2022-02-21 NOTE — Telephone Encounter (Signed)
Terrance Miller from [facility] notified and stated she will let family know of provider's recommendations

## 2022-02-21 NOTE — Telephone Encounter (Signed)
[facility] called and left message requesting orders to test urine for infection- U/A and culture Nurse states the patient's caregiver states the patient is having altered mental status and foul smelling urine.  [phone]

## 2022-02-23 DIAGNOSIS — R269 Unspecified abnormalities of gait and mobility: Secondary | ICD-10-CM | POA: Diagnosis not present

## 2022-02-23 DIAGNOSIS — M6281 Muscle weakness (generalized): Secondary | ICD-10-CM | POA: Diagnosis not present

## 2022-02-24 ENCOUNTER — Other Ambulatory Visit: Payer: Medicare Other

## 2022-02-24 DIAGNOSIS — Z7982 Long term (current) use of aspirin: Secondary | ICD-10-CM | POA: Diagnosis not present

## 2022-02-24 DIAGNOSIS — I252 Old myocardial infarction: Secondary | ICD-10-CM | POA: Diagnosis not present

## 2022-02-24 DIAGNOSIS — D472 Monoclonal gammopathy: Secondary | ICD-10-CM | POA: Diagnosis not present

## 2022-02-24 DIAGNOSIS — H9190 Unspecified hearing loss, unspecified ear: Secondary | ICD-10-CM | POA: Diagnosis not present

## 2022-02-24 DIAGNOSIS — I2510 Atherosclerotic heart disease of native coronary artery without angina pectoris: Secondary | ICD-10-CM | POA: Diagnosis not present

## 2022-02-24 DIAGNOSIS — R338 Other retention of urine: Secondary | ICD-10-CM | POA: Diagnosis not present

## 2022-02-24 DIAGNOSIS — K4020 Bilateral inguinal hernia, without obstruction or gangrene, not specified as recurrent: Secondary | ICD-10-CM | POA: Diagnosis not present

## 2022-02-24 DIAGNOSIS — N390 Urinary tract infection, site not specified: Secondary | ICD-10-CM | POA: Diagnosis not present

## 2022-02-24 DIAGNOSIS — K5909 Other constipation: Secondary | ICD-10-CM | POA: Diagnosis not present

## 2022-02-24 DIAGNOSIS — K5289 Other specified noninfective gastroenteritis and colitis: Secondary | ICD-10-CM | POA: Diagnosis not present

## 2022-02-24 DIAGNOSIS — T8131XA Disruption of external operation (surgical) wound, not elsewhere classified, initial encounter: Secondary | ICD-10-CM | POA: Diagnosis not present

## 2022-02-24 DIAGNOSIS — H409 Unspecified glaucoma: Secondary | ICD-10-CM | POA: Diagnosis not present

## 2022-02-24 DIAGNOSIS — F03918 Unspecified dementia, unspecified severity, with other behavioral disturbance: Secondary | ICD-10-CM | POA: Diagnosis not present

## 2022-02-24 DIAGNOSIS — I129 Hypertensive chronic kidney disease with stage 1 through stage 4 chronic kidney disease, or unspecified chronic kidney disease: Secondary | ICD-10-CM | POA: Diagnosis not present

## 2022-02-24 DIAGNOSIS — D539 Nutritional anemia, unspecified: Secondary | ICD-10-CM | POA: Diagnosis not present

## 2022-02-24 DIAGNOSIS — E1122 Type 2 diabetes mellitus with diabetic chronic kidney disease: Secondary | ICD-10-CM | POA: Diagnosis not present

## 2022-02-24 DIAGNOSIS — N1832 Chronic kidney disease, stage 3b: Secondary | ICD-10-CM | POA: Diagnosis not present

## 2022-02-24 DIAGNOSIS — E785 Hyperlipidemia, unspecified: Secondary | ICD-10-CM | POA: Diagnosis not present

## 2022-02-24 DIAGNOSIS — T83511A Infection and inflammatory reaction due to indwelling urethral catheter, initial encounter: Secondary | ICD-10-CM | POA: Diagnosis not present

## 2022-02-24 NOTE — Progress Notes (Signed)
History of Present Illness:   2.6.2024: This 89 year old male presents for follow-up of recent hospitalization for catheter related urinary tract infection.   Urologic history dates back to November 2023.  He underwent bilateral hydrocelectomy by Dr. Felipa Eth.  The patient developed wound dehiscence which has been treated with dressing changes since that time.  He has an indwelling Foley catheter.   Urologic consultation was requested 2 weeks ago for follow-up of his Foley catheter as well as catheter change.  That was performed by myself while in the hospital over guidewire.   The patient is bedbound.  He does have a healing scrotal wound.  I felt that it was best leaving a catheter in at least in the near future because of the above.   His daughter [name] accompanies him today.  No recent catheter problems.  She states that his wound is much smaller, perhaps 1 inch in length.     2.20.2024: Here for possible TOV.  Past Medical History:  Diagnosis Date   Acute ST elevation myocardial infarction (STEMI) of inferior wall ([place]) 2004   Coronary artery disease    DES x2 to the RCA 2004, residual disease managed medically   Hyperlipidemia    Hypertension    Type 2 diabetes mellitus ([place])     Past Surgical History:  Procedure Laterality Date   BIOPSY  01/28/2022   Procedure: BIOPSY;  Surgeon: Montez Morita, Quillian Quince, MD;  [facility];  Service: Gastroenterology;;   CATARACT EXTRACTION Bilateral    CORONARY ANGIOPLASTY WITH STENT PLACEMENT  09/05/2002   stent RCA, 80% first diagonal, 70-80% mid-diagonal, 80% mid LAD stenosis   FLEXIBLE SIGMOIDOSCOPY N/A 01/28/2022   Procedure: FLEXIBLE SIGMOIDOSCOPY;  Surgeon: Harvel Quale, MD;  [facility];  Service: Gastroenterology;  Laterality: N/A;   HYDROCELE EXCISION Bilateral 11/20/2021   Procedure: HYDROCELECTOMY ADULT;  Surgeon: Primus Bravo., MD;  [facility];  Service: Urology;  Laterality:  Bilateral;   IMPACTION REMOVAL  01/28/2022   Procedure: IMPACTION REMOVAL;  Surgeon: Harvel Quale, MD;  [facility];  Service: Gastroenterology;;   [place]  11/01/2008   Normal    Home Medications:  Allergies as of 02/25/2022   No Known Allergies      Medication List        Accurate as of February 24, 2022  5:29 PM. If you have any questions, ask your nurse or doctor.          amLODipine 5 MG tablet Commonly known as: NORVASC Take 1 tablet (5 mg total) by mouth daily.   aspirin EC 81 MG tablet Take 1 tablet (81 mg total) by mouth daily with breakfast. Swallow whole.   bisacodyl 10 MG suppository Commonly known as: DULCOLAX Place 1 suppository (10 mg total) rectally every Monday, Wednesday, and Friday.   carvedilol 6.25 MG tablet Commonly known as: COREG Take 1 tablet (6.25 mg total) by mouth 2 (two) times daily.   cholecalciferol 1000 units tablet Commonly known as: VITAMIN D Take 1,000 Units by mouth daily.   clobetasol ointment 0.05 % Commonly known as: TEMOVATE Apply 1 Application topically daily.   cyanocobalamin 1000 MCG tablet Commonly known as: VITAMIN B12 Take 1,000 mcg by mouth daily.   folic acid 1 MG tablet Commonly known as: FOLVITE Take 1 tablet (1 mg total) by mouth daily.   hydrocortisone 25 MG suppository Commonly known as: ANUSOL-HC Place 1 suppository (25 mg total) rectally every Tuesday, Thursday,  Saturday, and Sunday.   linezolid 600 MG tablet Commonly known as: ZYVOX Take 1 tablet (600 mg total) by mouth every 12 (twelve) hours.   polyethylene glycol 17 g packet Commonly known as: MIRALAX / GLYCOLAX Take 17 g by mouth 2 (two) times daily.   tamsulosin 0.4 MG Caps capsule Commonly known as: FLOMAX Take 0.4 mg by mouth daily.   Travatan Z 0.004 % Soln ophthalmic solution Generic drug: Travoprost (BAK Free) Place 1 drop into both eyes 2 (two) times daily.        Allergies: No  Known Allergies  Family History  Problem Relation Age of Onset   Hypertension Father    Diabetes Sister     Social History:  reports that he quit smoking about 16 years ago. His smoking use included cigarettes. He smoked an average of .25 packs per day. He has never used smokeless tobacco. He reports that he does not currently use alcohol. He reports that he does not use drugs.  ROS: A complete review of systems was performed.  All systems are negative except for pertinent findings as noted.  Physical Exam:  Vital signs in last 24 hours: There were no vitals taken for this visit. Constitutional:  Alert and oriented, No acute distress Cardiovascular: Regular rate  Respiratory: Normal respiratory effort GI: Abdomen is soft, nontender, nondistended, no abdominal masses. No CVAT.  Genitourinary: Normal male phallus, testes are descended bilaterally and non-tender and without masses, scrotum is normal in appearance without lesions or masses, perineum is normal on inspection. Lymphatic: No lymphadenopathy Neurologic: Grossly intact, no focal deficits Psychiatric: Normal mood and affect  I have reviewed prior pt notes  I have reviewed notes from referring/previous physicians  I have reviewed urinalysis results  I have independently reviewed prior imaging  I have reviewed prior PSA results  I have reviewed prior urine culture   Impression/Assessment:  ***  Plan:  ***

## 2022-02-25 ENCOUNTER — Ambulatory Visit (INDEPENDENT_AMBULATORY_CARE_PROVIDER_SITE_OTHER): Payer: Medicare Other | Admitting: Urology

## 2022-02-25 VITALS — BP 167/87 | HR 62

## 2022-02-25 DIAGNOSIS — N433 Hydrocele, unspecified: Secondary | ICD-10-CM

## 2022-02-25 DIAGNOSIS — R339 Retention of urine, unspecified: Secondary | ICD-10-CM | POA: Diagnosis not present

## 2022-02-25 DIAGNOSIS — Z87438 Personal history of other diseases of male genital organs: Secondary | ICD-10-CM

## 2022-02-25 DIAGNOSIS — N138 Other obstructive and reflux uropathy: Secondary | ICD-10-CM

## 2022-02-26 DIAGNOSIS — T83511A Infection and inflammatory reaction due to indwelling urethral catheter, initial encounter: Secondary | ICD-10-CM | POA: Diagnosis not present

## 2022-02-26 DIAGNOSIS — K5289 Other specified noninfective gastroenteritis and colitis: Secondary | ICD-10-CM | POA: Diagnosis not present

## 2022-02-26 DIAGNOSIS — K5909 Other constipation: Secondary | ICD-10-CM | POA: Diagnosis not present

## 2022-02-26 DIAGNOSIS — I2510 Atherosclerotic heart disease of native coronary artery without angina pectoris: Secondary | ICD-10-CM | POA: Diagnosis not present

## 2022-02-26 DIAGNOSIS — E1122 Type 2 diabetes mellitus with diabetic chronic kidney disease: Secondary | ICD-10-CM | POA: Diagnosis not present

## 2022-02-26 DIAGNOSIS — H409 Unspecified glaucoma: Secondary | ICD-10-CM | POA: Diagnosis not present

## 2022-02-26 DIAGNOSIS — E785 Hyperlipidemia, unspecified: Secondary | ICD-10-CM | POA: Diagnosis not present

## 2022-02-26 DIAGNOSIS — Z7982 Long term (current) use of aspirin: Secondary | ICD-10-CM | POA: Diagnosis not present

## 2022-02-26 DIAGNOSIS — F03918 Unspecified dementia, unspecified severity, with other behavioral disturbance: Secondary | ICD-10-CM | POA: Diagnosis not present

## 2022-02-26 DIAGNOSIS — K4020 Bilateral inguinal hernia, without obstruction or gangrene, not specified as recurrent: Secondary | ICD-10-CM | POA: Diagnosis not present

## 2022-02-26 DIAGNOSIS — I129 Hypertensive chronic kidney disease with stage 1 through stage 4 chronic kidney disease, or unspecified chronic kidney disease: Secondary | ICD-10-CM | POA: Diagnosis not present

## 2022-02-26 DIAGNOSIS — R338 Other retention of urine: Secondary | ICD-10-CM | POA: Diagnosis not present

## 2022-02-26 DIAGNOSIS — D472 Monoclonal gammopathy: Secondary | ICD-10-CM | POA: Diagnosis not present

## 2022-02-26 DIAGNOSIS — T8131XA Disruption of external operation (surgical) wound, not elsewhere classified, initial encounter: Secondary | ICD-10-CM | POA: Diagnosis not present

## 2022-02-26 DIAGNOSIS — D539 Nutritional anemia, unspecified: Secondary | ICD-10-CM | POA: Diagnosis not present

## 2022-02-26 DIAGNOSIS — N1832 Chronic kidney disease, stage 3b: Secondary | ICD-10-CM | POA: Diagnosis not present

## 2022-02-26 DIAGNOSIS — N390 Urinary tract infection, site not specified: Secondary | ICD-10-CM | POA: Diagnosis not present

## 2022-02-26 DIAGNOSIS — H9190 Unspecified hearing loss, unspecified ear: Secondary | ICD-10-CM | POA: Diagnosis not present

## 2022-02-26 DIAGNOSIS — I252 Old myocardial infarction: Secondary | ICD-10-CM | POA: Diagnosis not present

## 2022-02-28 DIAGNOSIS — K5909 Other constipation: Secondary | ICD-10-CM | POA: Diagnosis not present

## 2022-02-28 DIAGNOSIS — T8131XA Disruption of external operation (surgical) wound, not elsewhere classified, initial encounter: Secondary | ICD-10-CM | POA: Diagnosis not present

## 2022-02-28 DIAGNOSIS — R338 Other retention of urine: Secondary | ICD-10-CM | POA: Diagnosis not present

## 2022-02-28 DIAGNOSIS — K4020 Bilateral inguinal hernia, without obstruction or gangrene, not specified as recurrent: Secondary | ICD-10-CM | POA: Diagnosis not present

## 2022-02-28 DIAGNOSIS — E1122 Type 2 diabetes mellitus with diabetic chronic kidney disease: Secondary | ICD-10-CM | POA: Diagnosis not present

## 2022-02-28 DIAGNOSIS — I129 Hypertensive chronic kidney disease with stage 1 through stage 4 chronic kidney disease, or unspecified chronic kidney disease: Secondary | ICD-10-CM | POA: Diagnosis not present

## 2022-02-28 DIAGNOSIS — Z7982 Long term (current) use of aspirin: Secondary | ICD-10-CM | POA: Diagnosis not present

## 2022-02-28 DIAGNOSIS — F03918 Unspecified dementia, unspecified severity, with other behavioral disturbance: Secondary | ICD-10-CM | POA: Diagnosis not present

## 2022-02-28 DIAGNOSIS — T83511A Infection and inflammatory reaction due to indwelling urethral catheter, initial encounter: Secondary | ICD-10-CM | POA: Diagnosis not present

## 2022-02-28 DIAGNOSIS — H409 Unspecified glaucoma: Secondary | ICD-10-CM | POA: Diagnosis not present

## 2022-02-28 DIAGNOSIS — N390 Urinary tract infection, site not specified: Secondary | ICD-10-CM | POA: Diagnosis not present

## 2022-02-28 DIAGNOSIS — E785 Hyperlipidemia, unspecified: Secondary | ICD-10-CM | POA: Diagnosis not present

## 2022-02-28 DIAGNOSIS — I2510 Atherosclerotic heart disease of native coronary artery without angina pectoris: Secondary | ICD-10-CM | POA: Diagnosis not present

## 2022-02-28 DIAGNOSIS — D472 Monoclonal gammopathy: Secondary | ICD-10-CM | POA: Diagnosis not present

## 2022-02-28 DIAGNOSIS — N1832 Chronic kidney disease, stage 3b: Secondary | ICD-10-CM | POA: Diagnosis not present

## 2022-02-28 DIAGNOSIS — H9190 Unspecified hearing loss, unspecified ear: Secondary | ICD-10-CM | POA: Diagnosis not present

## 2022-02-28 DIAGNOSIS — I252 Old myocardial infarction: Secondary | ICD-10-CM | POA: Diagnosis not present

## 2022-02-28 DIAGNOSIS — D539 Nutritional anemia, unspecified: Secondary | ICD-10-CM | POA: Diagnosis not present

## 2022-02-28 DIAGNOSIS — K5289 Other specified noninfective gastroenteritis and colitis: Secondary | ICD-10-CM | POA: Diagnosis not present

## 2022-03-03 ENCOUNTER — Ambulatory Visit: Payer: Medicare Other | Admitting: Physician Assistant

## 2022-03-03 ENCOUNTER — Other Ambulatory Visit: Payer: Medicare Other

## 2022-03-03 DIAGNOSIS — K5289 Other specified noninfective gastroenteritis and colitis: Secondary | ICD-10-CM | POA: Diagnosis not present

## 2022-03-03 DIAGNOSIS — D539 Nutritional anemia, unspecified: Secondary | ICD-10-CM | POA: Diagnosis not present

## 2022-03-03 DIAGNOSIS — I129 Hypertensive chronic kidney disease with stage 1 through stage 4 chronic kidney disease, or unspecified chronic kidney disease: Secondary | ICD-10-CM | POA: Diagnosis not present

## 2022-03-03 DIAGNOSIS — T83511A Infection and inflammatory reaction due to indwelling urethral catheter, initial encounter: Secondary | ICD-10-CM | POA: Diagnosis not present

## 2022-03-03 DIAGNOSIS — K5909 Other constipation: Secondary | ICD-10-CM | POA: Diagnosis not present

## 2022-03-03 DIAGNOSIS — Z7982 Long term (current) use of aspirin: Secondary | ICD-10-CM | POA: Diagnosis not present

## 2022-03-03 DIAGNOSIS — D472 Monoclonal gammopathy: Secondary | ICD-10-CM | POA: Diagnosis not present

## 2022-03-03 DIAGNOSIS — H9190 Unspecified hearing loss, unspecified ear: Secondary | ICD-10-CM | POA: Diagnosis not present

## 2022-03-03 DIAGNOSIS — E1122 Type 2 diabetes mellitus with diabetic chronic kidney disease: Secondary | ICD-10-CM | POA: Diagnosis not present

## 2022-03-03 DIAGNOSIS — H409 Unspecified glaucoma: Secondary | ICD-10-CM | POA: Diagnosis not present

## 2022-03-03 DIAGNOSIS — N390 Urinary tract infection, site not specified: Secondary | ICD-10-CM | POA: Diagnosis not present

## 2022-03-03 DIAGNOSIS — I252 Old myocardial infarction: Secondary | ICD-10-CM | POA: Diagnosis not present

## 2022-03-03 DIAGNOSIS — R338 Other retention of urine: Secondary | ICD-10-CM | POA: Diagnosis not present

## 2022-03-03 DIAGNOSIS — I2510 Atherosclerotic heart disease of native coronary artery without angina pectoris: Secondary | ICD-10-CM | POA: Diagnosis not present

## 2022-03-03 DIAGNOSIS — K4020 Bilateral inguinal hernia, without obstruction or gangrene, not specified as recurrent: Secondary | ICD-10-CM | POA: Diagnosis not present

## 2022-03-03 DIAGNOSIS — F03918 Unspecified dementia, unspecified severity, with other behavioral disturbance: Secondary | ICD-10-CM | POA: Diagnosis not present

## 2022-03-03 DIAGNOSIS — T8131XA Disruption of external operation (surgical) wound, not elsewhere classified, initial encounter: Secondary | ICD-10-CM | POA: Diagnosis not present

## 2022-03-03 DIAGNOSIS — E785 Hyperlipidemia, unspecified: Secondary | ICD-10-CM | POA: Diagnosis not present

## 2022-03-03 DIAGNOSIS — N1832 Chronic kidney disease, stage 3b: Secondary | ICD-10-CM | POA: Diagnosis not present

## 2022-03-05 ENCOUNTER — Ambulatory Visit: Payer: Medicare Other

## 2022-03-05 DIAGNOSIS — E1122 Type 2 diabetes mellitus with diabetic chronic kidney disease: Secondary | ICD-10-CM | POA: Diagnosis not present

## 2022-03-05 DIAGNOSIS — D472 Monoclonal gammopathy: Secondary | ICD-10-CM | POA: Diagnosis not present

## 2022-03-05 DIAGNOSIS — F03918 Unspecified dementia, unspecified severity, with other behavioral disturbance: Secondary | ICD-10-CM | POA: Diagnosis not present

## 2022-03-05 DIAGNOSIS — D539 Nutritional anemia, unspecified: Secondary | ICD-10-CM | POA: Diagnosis not present

## 2022-03-05 DIAGNOSIS — Z7982 Long term (current) use of aspirin: Secondary | ICD-10-CM | POA: Diagnosis not present

## 2022-03-05 DIAGNOSIS — R338 Other retention of urine: Secondary | ICD-10-CM | POA: Diagnosis not present

## 2022-03-05 DIAGNOSIS — H9190 Unspecified hearing loss, unspecified ear: Secondary | ICD-10-CM | POA: Diagnosis not present

## 2022-03-05 DIAGNOSIS — T83511A Infection and inflammatory reaction due to indwelling urethral catheter, initial encounter: Secondary | ICD-10-CM | POA: Diagnosis not present

## 2022-03-05 DIAGNOSIS — I2510 Atherosclerotic heart disease of native coronary artery without angina pectoris: Secondary | ICD-10-CM | POA: Diagnosis not present

## 2022-03-05 DIAGNOSIS — I129 Hypertensive chronic kidney disease with stage 1 through stage 4 chronic kidney disease, or unspecified chronic kidney disease: Secondary | ICD-10-CM | POA: Diagnosis not present

## 2022-03-05 DIAGNOSIS — E785 Hyperlipidemia, unspecified: Secondary | ICD-10-CM | POA: Diagnosis not present

## 2022-03-05 DIAGNOSIS — H409 Unspecified glaucoma: Secondary | ICD-10-CM | POA: Diagnosis not present

## 2022-03-05 DIAGNOSIS — K5909 Other constipation: Secondary | ICD-10-CM | POA: Diagnosis not present

## 2022-03-05 DIAGNOSIS — K5289 Other specified noninfective gastroenteritis and colitis: Secondary | ICD-10-CM | POA: Diagnosis not present

## 2022-03-05 DIAGNOSIS — I252 Old myocardial infarction: Secondary | ICD-10-CM | POA: Diagnosis not present

## 2022-03-05 DIAGNOSIS — N1832 Chronic kidney disease, stage 3b: Secondary | ICD-10-CM | POA: Diagnosis not present

## 2022-03-05 DIAGNOSIS — K4020 Bilateral inguinal hernia, without obstruction or gangrene, not specified as recurrent: Secondary | ICD-10-CM | POA: Diagnosis not present

## 2022-03-05 DIAGNOSIS — N390 Urinary tract infection, site not specified: Secondary | ICD-10-CM | POA: Diagnosis not present

## 2022-03-05 DIAGNOSIS — T8131XA Disruption of external operation (surgical) wound, not elsewhere classified, initial encounter: Secondary | ICD-10-CM | POA: Diagnosis not present

## 2022-03-06 ENCOUNTER — Inpatient Hospital Stay: Payer: Medicare Other

## 2022-03-06 ENCOUNTER — Inpatient Hospital Stay: Payer: Medicare Other | Admitting: Physician Assistant

## 2022-03-06 DIAGNOSIS — K5289 Other specified noninfective gastroenteritis and colitis: Secondary | ICD-10-CM | POA: Diagnosis not present

## 2022-03-06 DIAGNOSIS — T8131XA Disruption of external operation (surgical) wound, not elsewhere classified, initial encounter: Secondary | ICD-10-CM | POA: Diagnosis not present

## 2022-03-06 DIAGNOSIS — R338 Other retention of urine: Secondary | ICD-10-CM | POA: Diagnosis not present

## 2022-03-06 DIAGNOSIS — I252 Old myocardial infarction: Secondary | ICD-10-CM | POA: Diagnosis not present

## 2022-03-06 DIAGNOSIS — T83511A Infection and inflammatory reaction due to indwelling urethral catheter, initial encounter: Secondary | ICD-10-CM | POA: Diagnosis not present

## 2022-03-06 DIAGNOSIS — I2510 Atherosclerotic heart disease of native coronary artery without angina pectoris: Secondary | ICD-10-CM | POA: Diagnosis not present

## 2022-03-06 DIAGNOSIS — K5909 Other constipation: Secondary | ICD-10-CM | POA: Diagnosis not present

## 2022-03-06 DIAGNOSIS — H9190 Unspecified hearing loss, unspecified ear: Secondary | ICD-10-CM | POA: Diagnosis not present

## 2022-03-06 DIAGNOSIS — E1122 Type 2 diabetes mellitus with diabetic chronic kidney disease: Secondary | ICD-10-CM | POA: Diagnosis not present

## 2022-03-06 DIAGNOSIS — D539 Nutritional anemia, unspecified: Secondary | ICD-10-CM | POA: Diagnosis not present

## 2022-03-06 DIAGNOSIS — N1832 Chronic kidney disease, stage 3b: Secondary | ICD-10-CM | POA: Diagnosis not present

## 2022-03-06 DIAGNOSIS — K4020 Bilateral inguinal hernia, without obstruction or gangrene, not specified as recurrent: Secondary | ICD-10-CM | POA: Diagnosis not present

## 2022-03-06 DIAGNOSIS — H409 Unspecified glaucoma: Secondary | ICD-10-CM | POA: Diagnosis not present

## 2022-03-06 DIAGNOSIS — Z7982 Long term (current) use of aspirin: Secondary | ICD-10-CM | POA: Diagnosis not present

## 2022-03-06 DIAGNOSIS — E785 Hyperlipidemia, unspecified: Secondary | ICD-10-CM | POA: Diagnosis not present

## 2022-03-06 DIAGNOSIS — I129 Hypertensive chronic kidney disease with stage 1 through stage 4 chronic kidney disease, or unspecified chronic kidney disease: Secondary | ICD-10-CM | POA: Diagnosis not present

## 2022-03-06 DIAGNOSIS — D472 Monoclonal gammopathy: Secondary | ICD-10-CM | POA: Diagnosis not present

## 2022-03-06 DIAGNOSIS — N390 Urinary tract infection, site not specified: Secondary | ICD-10-CM | POA: Diagnosis not present

## 2022-03-06 DIAGNOSIS — F03918 Unspecified dementia, unspecified severity, with other behavioral disturbance: Secondary | ICD-10-CM | POA: Diagnosis not present

## 2022-03-07 DIAGNOSIS — K5909 Other constipation: Secondary | ICD-10-CM | POA: Diagnosis not present

## 2022-03-07 DIAGNOSIS — N390 Urinary tract infection, site not specified: Secondary | ICD-10-CM | POA: Diagnosis not present

## 2022-03-07 DIAGNOSIS — T83511A Infection and inflammatory reaction due to indwelling urethral catheter, initial encounter: Secondary | ICD-10-CM | POA: Diagnosis not present

## 2022-03-07 DIAGNOSIS — K5289 Other specified noninfective gastroenteritis and colitis: Secondary | ICD-10-CM | POA: Diagnosis not present

## 2022-03-07 DIAGNOSIS — N1832 Chronic kidney disease, stage 3b: Secondary | ICD-10-CM | POA: Diagnosis not present

## 2022-03-07 DIAGNOSIS — E785 Hyperlipidemia, unspecified: Secondary | ICD-10-CM | POA: Diagnosis not present

## 2022-03-07 DIAGNOSIS — K4020 Bilateral inguinal hernia, without obstruction or gangrene, not specified as recurrent: Secondary | ICD-10-CM | POA: Diagnosis not present

## 2022-03-07 DIAGNOSIS — D539 Nutritional anemia, unspecified: Secondary | ICD-10-CM | POA: Diagnosis not present

## 2022-03-07 DIAGNOSIS — E1122 Type 2 diabetes mellitus with diabetic chronic kidney disease: Secondary | ICD-10-CM | POA: Diagnosis not present

## 2022-03-07 DIAGNOSIS — Z7982 Long term (current) use of aspirin: Secondary | ICD-10-CM | POA: Diagnosis not present

## 2022-03-07 DIAGNOSIS — I129 Hypertensive chronic kidney disease with stage 1 through stage 4 chronic kidney disease, or unspecified chronic kidney disease: Secondary | ICD-10-CM | POA: Diagnosis not present

## 2022-03-07 DIAGNOSIS — I2510 Atherosclerotic heart disease of native coronary artery without angina pectoris: Secondary | ICD-10-CM | POA: Diagnosis not present

## 2022-03-07 DIAGNOSIS — T8131XA Disruption of external operation (surgical) wound, not elsewhere classified, initial encounter: Secondary | ICD-10-CM | POA: Diagnosis not present

## 2022-03-07 DIAGNOSIS — H9190 Unspecified hearing loss, unspecified ear: Secondary | ICD-10-CM | POA: Diagnosis not present

## 2022-03-07 DIAGNOSIS — H409 Unspecified glaucoma: Secondary | ICD-10-CM | POA: Diagnosis not present

## 2022-03-07 DIAGNOSIS — R338 Other retention of urine: Secondary | ICD-10-CM | POA: Diagnosis not present

## 2022-03-07 DIAGNOSIS — D472 Monoclonal gammopathy: Secondary | ICD-10-CM | POA: Diagnosis not present

## 2022-03-07 DIAGNOSIS — F03918 Unspecified dementia, unspecified severity, with other behavioral disturbance: Secondary | ICD-10-CM | POA: Diagnosis not present

## 2022-03-07 DIAGNOSIS — I252 Old myocardial infarction: Secondary | ICD-10-CM | POA: Diagnosis not present

## 2022-03-10 DIAGNOSIS — E785 Hyperlipidemia, unspecified: Secondary | ICD-10-CM | POA: Diagnosis not present

## 2022-03-10 DIAGNOSIS — N1832 Chronic kidney disease, stage 3b: Secondary | ICD-10-CM | POA: Diagnosis not present

## 2022-03-10 DIAGNOSIS — T83511A Infection and inflammatory reaction due to indwelling urethral catheter, initial encounter: Secondary | ICD-10-CM | POA: Diagnosis not present

## 2022-03-10 DIAGNOSIS — K5289 Other specified noninfective gastroenteritis and colitis: Secondary | ICD-10-CM | POA: Diagnosis not present

## 2022-03-10 DIAGNOSIS — F03918 Unspecified dementia, unspecified severity, with other behavioral disturbance: Secondary | ICD-10-CM | POA: Diagnosis not present

## 2022-03-10 DIAGNOSIS — T8131XA Disruption of external operation (surgical) wound, not elsewhere classified, initial encounter: Secondary | ICD-10-CM | POA: Diagnosis not present

## 2022-03-10 DIAGNOSIS — D539 Nutritional anemia, unspecified: Secondary | ICD-10-CM | POA: Diagnosis not present

## 2022-03-10 DIAGNOSIS — I252 Old myocardial infarction: Secondary | ICD-10-CM | POA: Diagnosis not present

## 2022-03-10 DIAGNOSIS — E1122 Type 2 diabetes mellitus with diabetic chronic kidney disease: Secondary | ICD-10-CM | POA: Diagnosis not present

## 2022-03-10 DIAGNOSIS — I129 Hypertensive chronic kidney disease with stage 1 through stage 4 chronic kidney disease, or unspecified chronic kidney disease: Secondary | ICD-10-CM | POA: Diagnosis not present

## 2022-03-10 DIAGNOSIS — R338 Other retention of urine: Secondary | ICD-10-CM | POA: Diagnosis not present

## 2022-03-10 DIAGNOSIS — H9190 Unspecified hearing loss, unspecified ear: Secondary | ICD-10-CM | POA: Diagnosis not present

## 2022-03-10 DIAGNOSIS — Z7982 Long term (current) use of aspirin: Secondary | ICD-10-CM | POA: Diagnosis not present

## 2022-03-10 DIAGNOSIS — D472 Monoclonal gammopathy: Secondary | ICD-10-CM | POA: Diagnosis not present

## 2022-03-10 DIAGNOSIS — H409 Unspecified glaucoma: Secondary | ICD-10-CM | POA: Diagnosis not present

## 2022-03-10 DIAGNOSIS — I2510 Atherosclerotic heart disease of native coronary artery without angina pectoris: Secondary | ICD-10-CM | POA: Diagnosis not present

## 2022-03-10 DIAGNOSIS — K4020 Bilateral inguinal hernia, without obstruction or gangrene, not specified as recurrent: Secondary | ICD-10-CM | POA: Diagnosis not present

## 2022-03-10 DIAGNOSIS — N390 Urinary tract infection, site not specified: Secondary | ICD-10-CM | POA: Diagnosis not present

## 2022-03-10 DIAGNOSIS — K5909 Other constipation: Secondary | ICD-10-CM | POA: Diagnosis not present

## 2022-03-12 ENCOUNTER — Ambulatory Visit: Payer: Medicare Other

## 2022-03-12 DIAGNOSIS — T8131XA Disruption of external operation (surgical) wound, not elsewhere classified, initial encounter: Secondary | ICD-10-CM | POA: Diagnosis not present

## 2022-03-12 DIAGNOSIS — K5289 Other specified noninfective gastroenteritis and colitis: Secondary | ICD-10-CM | POA: Diagnosis not present

## 2022-03-12 DIAGNOSIS — I252 Old myocardial infarction: Secondary | ICD-10-CM | POA: Diagnosis not present

## 2022-03-12 DIAGNOSIS — E785 Hyperlipidemia, unspecified: Secondary | ICD-10-CM | POA: Diagnosis not present

## 2022-03-12 DIAGNOSIS — R338 Other retention of urine: Secondary | ICD-10-CM | POA: Diagnosis not present

## 2022-03-12 DIAGNOSIS — N1832 Chronic kidney disease, stage 3b: Secondary | ICD-10-CM | POA: Diagnosis not present

## 2022-03-12 DIAGNOSIS — T83511A Infection and inflammatory reaction due to indwelling urethral catheter, initial encounter: Secondary | ICD-10-CM | POA: Diagnosis not present

## 2022-03-12 DIAGNOSIS — D472 Monoclonal gammopathy: Secondary | ICD-10-CM | POA: Diagnosis not present

## 2022-03-12 DIAGNOSIS — H9190 Unspecified hearing loss, unspecified ear: Secondary | ICD-10-CM | POA: Diagnosis not present

## 2022-03-12 DIAGNOSIS — F03918 Unspecified dementia, unspecified severity, with other behavioral disturbance: Secondary | ICD-10-CM | POA: Diagnosis not present

## 2022-03-12 DIAGNOSIS — Z7982 Long term (current) use of aspirin: Secondary | ICD-10-CM | POA: Diagnosis not present

## 2022-03-12 DIAGNOSIS — I2510 Atherosclerotic heart disease of native coronary artery without angina pectoris: Secondary | ICD-10-CM | POA: Diagnosis not present

## 2022-03-12 DIAGNOSIS — N390 Urinary tract infection, site not specified: Secondary | ICD-10-CM | POA: Diagnosis not present

## 2022-03-12 DIAGNOSIS — D539 Nutritional anemia, unspecified: Secondary | ICD-10-CM | POA: Diagnosis not present

## 2022-03-12 DIAGNOSIS — I129 Hypertensive chronic kidney disease with stage 1 through stage 4 chronic kidney disease, or unspecified chronic kidney disease: Secondary | ICD-10-CM | POA: Diagnosis not present

## 2022-03-12 DIAGNOSIS — K4020 Bilateral inguinal hernia, without obstruction or gangrene, not specified as recurrent: Secondary | ICD-10-CM | POA: Diagnosis not present

## 2022-03-12 DIAGNOSIS — K5909 Other constipation: Secondary | ICD-10-CM | POA: Diagnosis not present

## 2022-03-12 DIAGNOSIS — H409 Unspecified glaucoma: Secondary | ICD-10-CM | POA: Diagnosis not present

## 2022-03-12 DIAGNOSIS — E1122 Type 2 diabetes mellitus with diabetic chronic kidney disease: Secondary | ICD-10-CM | POA: Diagnosis not present

## 2022-03-13 ENCOUNTER — Telehealth: Payer: Self-pay

## 2022-03-13 ENCOUNTER — Ambulatory Visit (INDEPENDENT_AMBULATORY_CARE_PROVIDER_SITE_OTHER): Payer: Medicare Other | Admitting: Urology

## 2022-03-13 ENCOUNTER — Ambulatory Visit: Payer: Self-pay | Admitting: *Deleted

## 2022-03-13 DIAGNOSIS — N433 Hydrocele, unspecified: Secondary | ICD-10-CM

## 2022-03-13 NOTE — Telephone Encounter (Signed)
Open in error

## 2022-03-13 NOTE — Patient Outreach (Signed)
  Care Coordination   03/13/2022 Name: Terrance Miller MRN: [id number] DOB: [birth year]   Care Coordination Outreach Attempts:  A second unsuccessful outreach was attempted today to offer the patient with information about available care coordination services as a benefit of their health plan.     Follow Up Plan:  Additional outreach attempts will be made to offer the patient care coordination information and services.   Encounter Outcome:  No Answer   Care Coordination Interventions:  No, not indicated    Teryl Gubler L. Lavina Hamman, RN, BSN, [place] Coordinator Office number [phone]

## 2022-03-13 NOTE — Patient Outreach (Addendum)
  Care Coordination   Initial Visit Note   03/14/2022 Name: Terrance Miller MRN: [id number] DOB: 1928/06/26  Terrance Miller is a 89 y.o. year old male who sees Coral Spikes, DO for primary care. I spoke with  Terrance Miller and his daughter Terrance Miller by phone today.  What matters to the patients health and wellness today? Denies concern with blood pressure. Reports the primary concern today is pain of his legs & toes are sore  Episode of confusion resolved-After the next morning of the confusion patient was able to tell Terrance Miller that he was dreaming  Terrance Miller monitored his urine & behavior Terrance Miller reports she did not have to take him to the ED    Goals Addressed             This Visit's Progress    pain in toe & leg [facility])   Not on track    Care Coordination Interventions: Discussed importance of adherence to all scheduled medical appointments Counseled on the importance of reporting any/all new or changed pain symptoms or management strategies to pain management provider Screening for signs and symptoms of depression related to chronic disease state  Interventions Today    Flowsheet Row Most Recent Value  Chronic Disease   Chronic disease during today's visit Other, Hypertension (HTN)  [pain in his toes and leg]  General Interventions   General Interventions Discussed/Reviewed Communication with  Doctor Visits Discussed/Reviewed Doctor Visits Discussed, PCP, Specialist  PCP/Specialist Visits Compliance with follow-up visit  Communication with PCP/Specialists  [sent [facility] clinical pool message about patient request for assistance with pain control and referral to podiatrist]  Education Interventions   Education Provided Provided Education  Provided Verbal Education On Other  [possible reasons for toe/leg pain]             SDOH assessments and interventions completed:  Yes  SDOH Interventions Today    Flowsheet Row Most Recent Value  SDOH Interventions   Housing Interventions  Intervention Not Indicated  Transportation Interventions Intervention Not Indicated  Utilities Interventions Intervention Not Indicated  Financial Strain Interventions Intervention Not Indicated  Stress Interventions Intervention Not Indicated        Care Coordination Interventions:  Yes, provided   Follow up plan: Follow up call scheduled for 04/16/22 1115    Encounter Outcome:  Pt. Visit Completed   Terrance Miller L. Terrance Hamman, RN, BSN, [place] Coordinator Office number [phone]

## 2022-03-13 NOTE — Progress Notes (Addendum)
Fill and Pull Catheter Removal  Patient is present today for a catheter removal.  Patient was cleaned and prepped in a sterile fashion 40m of sterile water/ saline was instilled into the bladder. Patient had bladder spasm and unable to void or pass voiding trial and water was returned. Patient cannot void  035mon their own after some time.  Patient tolerated well. Patient came in today for voiding trial and review with Dr. WrJeffie Pollockbout patient having bladder spasm during voiding trial. Tried putting water into patient bladder and it was return due to bladder spasm and unable to complete voiding trial. Patient daughter is aware  to leave catheter in for now due to patient continued to have bladder spasm during voiding trial and patient is scheduled for another appt today. Daughter is aware that I will send a task to the MD on advisement. Daughter voiced understanding.  Performed by: ShMarisue BrooklynCMA  Follow up/ Additional notes: keep next appt

## 2022-03-14 DIAGNOSIS — T83511A Infection and inflammatory reaction due to indwelling urethral catheter, initial encounter: Secondary | ICD-10-CM | POA: Diagnosis not present

## 2022-03-14 DIAGNOSIS — N390 Urinary tract infection, site not specified: Secondary | ICD-10-CM | POA: Diagnosis not present

## 2022-03-14 DIAGNOSIS — T8131XA Disruption of external operation (surgical) wound, not elsewhere classified, initial encounter: Secondary | ICD-10-CM | POA: Diagnosis not present

## 2022-03-14 DIAGNOSIS — D539 Nutritional anemia, unspecified: Secondary | ICD-10-CM | POA: Diagnosis not present

## 2022-03-14 DIAGNOSIS — R338 Other retention of urine: Secondary | ICD-10-CM | POA: Diagnosis not present

## 2022-03-14 DIAGNOSIS — F03918 Unspecified dementia, unspecified severity, with other behavioral disturbance: Secondary | ICD-10-CM | POA: Diagnosis not present

## 2022-03-14 DIAGNOSIS — N1832 Chronic kidney disease, stage 3b: Secondary | ICD-10-CM | POA: Diagnosis not present

## 2022-03-14 DIAGNOSIS — H9190 Unspecified hearing loss, unspecified ear: Secondary | ICD-10-CM | POA: Diagnosis not present

## 2022-03-14 DIAGNOSIS — I252 Old myocardial infarction: Secondary | ICD-10-CM | POA: Diagnosis not present

## 2022-03-14 DIAGNOSIS — D472 Monoclonal gammopathy: Secondary | ICD-10-CM | POA: Diagnosis not present

## 2022-03-14 DIAGNOSIS — K4020 Bilateral inguinal hernia, without obstruction or gangrene, not specified as recurrent: Secondary | ICD-10-CM | POA: Diagnosis not present

## 2022-03-14 DIAGNOSIS — I129 Hypertensive chronic kidney disease with stage 1 through stage 4 chronic kidney disease, or unspecified chronic kidney disease: Secondary | ICD-10-CM | POA: Diagnosis not present

## 2022-03-14 DIAGNOSIS — E785 Hyperlipidemia, unspecified: Secondary | ICD-10-CM | POA: Diagnosis not present

## 2022-03-14 DIAGNOSIS — H409 Unspecified glaucoma: Secondary | ICD-10-CM | POA: Diagnosis not present

## 2022-03-14 DIAGNOSIS — Z7982 Long term (current) use of aspirin: Secondary | ICD-10-CM | POA: Diagnosis not present

## 2022-03-14 DIAGNOSIS — E1122 Type 2 diabetes mellitus with diabetic chronic kidney disease: Secondary | ICD-10-CM | POA: Diagnosis not present

## 2022-03-14 DIAGNOSIS — K5909 Other constipation: Secondary | ICD-10-CM | POA: Diagnosis not present

## 2022-03-14 DIAGNOSIS — I2510 Atherosclerotic heart disease of native coronary artery without angina pectoris: Secondary | ICD-10-CM | POA: Diagnosis not present

## 2022-03-14 DIAGNOSIS — K5289 Other specified noninfective gastroenteritis and colitis: Secondary | ICD-10-CM | POA: Diagnosis not present

## 2022-03-14 NOTE — Patient Instructions (Addendum)
Visit Information  Thank you for taking time to visit with me today. Please don't hesitate to contact me if I can be of assistance to you.   Following are the goals we discussed today:   Goals Addressed             This Visit's Progress    pain in toe & leg [facility])       Care Coordination Interventions: Discussed importance of adherence to all scheduled medical appointments Counseled on the importance of reporting any/all new or changed pain symptoms or management strategies to pain management provider Screening for signs and symptoms of depression related to chronic disease state  Interventions Today    Flowsheet Row Most Recent Value  Chronic Disease   Chronic disease during today's visit Other, Hypertension (HTN)  [pain in his toes and leg]  General Interventions   General Interventions Discussed/Reviewed General Interventions Discussed, Doctor Visits  Doctor Visits Discussed/Reviewed Doctor Visits Discussed, PCP, Specialist  PCP/Specialist Visits Compliance with follow-up visit  Education Interventions   Education Provided Provided Education  Provided Verbal Education On Other  [possible reasons for toe/leg pain]             Our next appointment is by telephone on 04/16/22 at 1115  Please call the care guide team at [phone] if you need to cancel or reschedule your appointment.   If you are experiencing a Mental Health or [place] or need someone to talk to, please call the Suicide and Crisis Lifeline: 988 call the [place] National Suicide Prevention Lifeline: 613-458-1160 or TTY: 573-623-5836 TTY (504)061-3397) to talk to a trained counselor call 1-800-273-TALK (toll free, 24 hour hotline) call the [facility]: [phone] call 911   The patient verbalized understanding of instructions, educational materials, and care plan provided today and DECLINED offer to receive copy of patient instructions, educational materials, and care  plan.   The patient has been provided with contact information for the care management team and has been advised to call with any health related questions or concerns.   Shonte Beutler L. Lavina Hamman, RN, BSN, [place] Coordinator Office number [phone]

## 2022-03-17 ENCOUNTER — Ambulatory Visit: Payer: Medicare Other | Attending: Internal Medicine | Admitting: Internal Medicine

## 2022-03-17 ENCOUNTER — Ambulatory Visit: Payer: Medicare Other | Admitting: Internal Medicine

## 2022-03-17 ENCOUNTER — Encounter: Payer: Self-pay | Admitting: Internal Medicine

## 2022-03-17 VITALS — BP 122/62 | HR 66 | Ht 67.0 in | Wt 194.0 lb

## 2022-03-17 DIAGNOSIS — T83511A Infection and inflammatory reaction due to indwelling urethral catheter, initial encounter: Secondary | ICD-10-CM | POA: Diagnosis not present

## 2022-03-17 DIAGNOSIS — N390 Urinary tract infection, site not specified: Secondary | ICD-10-CM | POA: Diagnosis not present

## 2022-03-17 DIAGNOSIS — I351 Nonrheumatic aortic (valve) insufficiency: Secondary | ICD-10-CM

## 2022-03-17 DIAGNOSIS — H409 Unspecified glaucoma: Secondary | ICD-10-CM | POA: Diagnosis not present

## 2022-03-17 DIAGNOSIS — I2510 Atherosclerotic heart disease of native coronary artery without angina pectoris: Secondary | ICD-10-CM | POA: Diagnosis not present

## 2022-03-17 DIAGNOSIS — D539 Nutritional anemia, unspecified: Secondary | ICD-10-CM | POA: Diagnosis not present

## 2022-03-17 DIAGNOSIS — N1832 Chronic kidney disease, stage 3b: Secondary | ICD-10-CM | POA: Diagnosis not present

## 2022-03-17 DIAGNOSIS — F03918 Unspecified dementia, unspecified severity, with other behavioral disturbance: Secondary | ICD-10-CM | POA: Diagnosis not present

## 2022-03-17 DIAGNOSIS — K5909 Other constipation: Secondary | ICD-10-CM | POA: Diagnosis not present

## 2022-03-17 DIAGNOSIS — H9190 Unspecified hearing loss, unspecified ear: Secondary | ICD-10-CM | POA: Diagnosis not present

## 2022-03-17 DIAGNOSIS — E785 Hyperlipidemia, unspecified: Secondary | ICD-10-CM | POA: Diagnosis not present

## 2022-03-17 DIAGNOSIS — D472 Monoclonal gammopathy: Secondary | ICD-10-CM | POA: Diagnosis not present

## 2022-03-17 DIAGNOSIS — T8131XA Disruption of external operation (surgical) wound, not elsewhere classified, initial encounter: Secondary | ICD-10-CM | POA: Diagnosis not present

## 2022-03-17 DIAGNOSIS — K4020 Bilateral inguinal hernia, without obstruction or gangrene, not specified as recurrent: Secondary | ICD-10-CM | POA: Diagnosis not present

## 2022-03-17 DIAGNOSIS — R338 Other retention of urine: Secondary | ICD-10-CM | POA: Diagnosis not present

## 2022-03-17 DIAGNOSIS — I272 Pulmonary hypertension, unspecified: Secondary | ICD-10-CM | POA: Insufficient documentation

## 2022-03-17 DIAGNOSIS — I252 Old myocardial infarction: Secondary | ICD-10-CM | POA: Diagnosis not present

## 2022-03-17 DIAGNOSIS — E1122 Type 2 diabetes mellitus with diabetic chronic kidney disease: Secondary | ICD-10-CM | POA: Diagnosis not present

## 2022-03-17 DIAGNOSIS — Z7982 Long term (current) use of aspirin: Secondary | ICD-10-CM | POA: Diagnosis not present

## 2022-03-17 DIAGNOSIS — K5289 Other specified noninfective gastroenteritis and colitis: Secondary | ICD-10-CM | POA: Diagnosis not present

## 2022-03-17 DIAGNOSIS — I129 Hypertensive chronic kidney disease with stage 1 through stage 4 chronic kidney disease, or unspecified chronic kidney disease: Secondary | ICD-10-CM | POA: Diagnosis not present

## 2022-03-17 MED ORDER — ROSUVASTATIN CALCIUM 5 MG PO TABS: 5.0000 mg | ORAL_TABLET | Freq: Every day | ORAL | 3 refills | Status: DC

## 2022-03-17 NOTE — Progress Notes (Signed)
Cardiology Office Note  Date: 03/17/2022   ID: Terrance Miller, DOB February 09, 1928, MRN [id number]  PCP:  Coral Spikes, DO  Cardiologist:  Donato Heinz, MD Electrophysiologist:  None   Reason for Office Visit: CAD follow-up   History of Present Illness: Terrance Miller is a 89 y.o. male known to have CAD s/p STEMI in 2004 s/p RCA PCI with residual 80% D1 and 80% mid LAD stenosis with normal LVEF, HTN, DM 2, HLD presented to cardiology clinic for follow-up visit.  Accompanied by daughter.  Lives with the daughter.  Patient was here in 09/2021 for preop cardiac risk stratification for hydrocele.  He underwent bilateral hydrocelectomy in [id number] complicated by wound dehiscence and UTI currently on Foley catheter. He is wheelchair-bound, walks for 50 feet with a walker at home, can feed himself but needs assistance going to the restroom/taking a shower. Most recent echocardiogram from 2023 showed LV normal LVEF, mild AI (stable mild AI since 09/2013) and severely elevated PASP.  He denies any symptoms of angina, DOE (has some SOB with walking but no worsening), syncope, palpitations and leg swelling.  Has some dizziness with walking but not consistently.  Denies smoking cigarettes.  Past Medical History:  Diagnosis Date   Acute ST elevation myocardial infarction (STEMI) of inferior wall ([place]) 2004   Coronary artery disease    DES x2 to the RCA 2004, residual disease managed medically   Hyperlipidemia    Hypertension    Type 2 diabetes mellitus ([place])     Past Surgical History:  Procedure Laterality Date   BIOPSY  01/28/2022   Procedure: BIOPSY;  Surgeon: Montez Morita, Quillian Quince, MD;  Location: [facility];  Service: Gastroenterology;;   CATARACT EXTRACTION Bilateral    CORONARY ANGIOPLASTY WITH STENT PLACEMENT  09/05/2002   stent RCA, 80% first diagonal, 70-80% mid-diagonal, 80% mid LAD stenosis   FLEXIBLE SIGMOIDOSCOPY N/A 01/28/2022   Procedure: FLEXIBLE SIGMOIDOSCOPY;  Surgeon:  Harvel Quale, MD;  Location: [facility];  Service: Gastroenterology;  Laterality: N/A;   HYDROCELE EXCISION Bilateral 11/20/2021   Procedure: HYDROCELECTOMY ADULT;  Surgeon: Primus Bravo., MD;  Location: [facility] ORS;  Service: Urology;  Laterality: Bilateral;   IMPACTION REMOVAL  01/28/2022   Procedure: IMPACTION REMOVAL;  Surgeon: Montez Morita, Quillian Quince, MD;  Location: [facility];  Service: Gastroenterology;;   NM MYOCAR PERF WALL MOTION  11/01/2008   Normal    Current Outpatient Medications  Medication Sig Dispense Refill   amLODipine (NORVASC) 5 MG tablet Take 1 tablet (5 mg total) by mouth daily. 30 tablet 2   aspirin EC 81 MG tablet Take 1 tablet (81 mg total) by mouth daily with breakfast. Swallow whole. 90 tablet 3   bisacodyl (DULCOLAX) 10 MG suppository Place 1 suppository (10 mg total) rectally every Monday, Wednesday, and Friday. 15 suppository 3   carvedilol (COREG) 6.25 MG tablet Take 1 tablet (6.25 mg total) by mouth 2 (two) times daily. 180 tablet 3   cholecalciferol (VITAMIN D) 1000 units tablet Take 1,000 Units by mouth daily.     clobetasol ointment (TEMOVATE) [id number] % Apply 1 Application topically daily. 60 g 0   cyanocobalamin (VITAMIN B12) 1000 MCG tablet Take 1,000 mcg by mouth daily.     hydrocortisone (ANUSOL-HC) 25 MG suppository Place 1 suppository (25 mg total) rectally every Tuesday, Thursday, Saturday, and Sunday. 12 suppository 1   linezolid (ZYVOX) 600 MG tablet Take 1 tablet (600 mg total) by mouth every 12 (twelve) hours. 10  tablet 0   polyethylene glycol (MIRALAX / GLYCOLAX) 17 g packet Take 17 g by mouth 2 (two) times daily. 60 each 3   tamsulosin (FLOMAX) 0.4 MG CAPS capsule Take 0.4 mg by mouth daily.     TRAVATAN Z 0.004 % SOLN ophthalmic solution Place 1 drop into both eyes 2 (two) times daily.     folic acid (FOLVITE) 1 MG tablet Take 1 tablet (1 mg total) by mouth daily. (Patient not taking: Reported on 02/25/2022)     No current  facility-administered medications for this visit.   Allergies:  Patient has no known allergies.   Social History: The patient  reports that he quit smoking about 16 years ago. His smoking use included cigarettes. He smoked an average of .25 packs per day. He has never used smokeless tobacco. He reports that he does not currently use alcohol. He reports that he does not use drugs.   Family History: The patient's family history includes Diabetes in his sister; Hypertension in his father.   ROS:  Please see the history of present illness. Otherwise, complete review of systems is positive for none.  All other systems are reviewed and negative.   Physical Exam: VS:  BP 122/62   Pulse 66   Ht '5\' 7"'$  (1.702 m)   Wt 194 lb (88 kg)   SpO2 99%   BMI 30.38 kg/m , BMI Body mass index is 30.38 kg/m.  Wt Readings from Last 3 Encounters:  03/17/22 194 lb (88 kg)  01/28/22 186 lb 11.7 oz (84.7 kg)  11/30/21 240 lb (108.9 kg)    General: Patient appears comfortable at rest. HEENT: Conjunctiva and lids normal, oropharynx clear with moist mucosa. Neck: Supple, no elevated JVP or carotid bruits, no thyromegaly. Lungs: Clear to auscultation, nonlabored breathing at rest. Cardiac: Regular rate and rhythm, no S3 or significant systolic murmur, no pericardial rub. Abdomen: Soft, nontender, no hepatomegaly, bowel sounds present, no guarding or rebound. Extremities: No pitting edema, distal pulses 2+. Skin: Warm and dry. Musculoskeletal: No kyphosis. Neuropsychiatric: Alert and oriented x3, affect grossly appropriate.  ECG: NSR  Recent Labwork: 07/28/2021: TSH 1.778 01/28/2022: ALT 9; AST 19; Magnesium 1.8 01/29/2022: BUN 12; Creatinine, Ser 1.08; Potassium 4.5; Sodium 139 01/31/2022: Hemoglobin 8.6; Platelets 172     Component Value Date/Time   CHOL 137 11/04/2021 1612   TRIG 94 11/04/2021 1612   HDL 46 11/04/2021 1612   CHOLHDL 3.0 11/04/2021 1612   CHOLHDL 3.1 09/28/2013 1124   VLDL 16  09/28/2013 1124   [place] 73 11/04/2021 1612    Other Studies Reviewed Today: Echocardiogram from 09/2021 LVEF normal G1 DD PASP 62.2 mmHg, severe pulmonary HTN Mild AI  Assessment and Plan: Patient is a 89 year old M  known to have CAD s/p STEMI in 2004 s/p RCA PCI with residual 80% D1 and 80% mid LAD stenosis with normal LVEF, HTN, DM 2, HLD presented to cardiology clinic for follow-up visit.  Accompanied by daughter.  # CAD s/p STEMI in 2004 s/p RCA PCI with residual 80% D1 and 80% mid LAD stenosis with normal LVEF, currently angina free -Continue aspirin 81 mg once daily -LDL at goal without being on statin, will initiate moderate intensity statin, rosuvastatin 5 mg nightly.  # Mild valvular disease, mild AI -Mild AI has been stable since 2015. Most recent echocardiogram from 2023 showed mild AI as well.  Next echocardiogram will be in 2026.  # HLD -LDL at goal without being on statin, will initiate moderate  intensity statin, rosuvastatin 5 mg nightly.  # HTN, controlled -Continue carvedilol 6.25 mg twice daily, amlodipine 5 mg once daily  # Severe pulmonary HTN -Echo from 2023 showed normal LVEF, G1 DD, mild LVH and severely elevated PASP, 62.2 mmHg. Patient not active at baseline, can walk 50 feet with a walker. Will not actively pursue further evaluation. No symptoms.  I have spent a total of 33 minutes with patient reviewing chart, EKGs, labs and examining patient as well as establishing an assessment and plan that was discussed with the patient.  > 50% of time was spent in direct patient care.     Medication Adjustments/Labs and Tests Ordered: Current medicines are reviewed at length with the patient today.  Concerns regarding medicines are outlined above.   Tests Ordered: No orders of the defined types were placed in this encounter.   Medication Changes: No orders of the defined types were placed in this encounter.   Disposition:  Follow up  1  year  Signed, Nazario Russom Fidel Levy, MD, 03/17/2022 8:01 AM    [facility] [address], [place], [place] [address]

## 2022-03-17 NOTE — Patient Instructions (Signed)
Medication Instructions:  Your physician recommends that you continue on your current medications as directed. Please refer to the Current Medication list given to you today.  *If you need a refill on your cardiac medications before your next appointment, please call your pharmacy*   Lab Work: None If you have labs (blood work) drawn today and your tests are completely normal, you will receive your results only by: MyChart Message (if you have MyChart) OR A paper copy in the mail If you have any lab test that is abnormal or we need to change your treatment, we will call you to review the results.   Testing/Procedures: None   Follow-Up: At [facility], you and your health needs are our priority.  As part of our continuing mission to provide you with exceptional heart care, we have created designated Provider Care Teams.  These Care Teams include your primary Cardiologist (physician) and Advanced Practice Providers (APPs -  Physician Assistants and Nurse Practitioners) who all work together to provide you with the care you need, when you need it.  We recommend signing up for the patient portal called "MyChart".  Sign up information is provided on this After Visit Summary.  MyChart is used to connect with patients for Virtual Visits (Telemedicine).  Patients are able to view lab/test results, encounter notes, upcoming appointments, etc.  Non-urgent messages can be sent to your provider as well.   To learn more about what you can do with MyChart, go to [email or web address].    Your next appointment:   1 year(s)  Provider:   Vishnu Mallipeddi, MD    Other Instructions    

## 2022-03-17 NOTE — Addendum Note (Signed)
Addended by: Christella Scheuermann C on: 03/17/2022 09:03 AM   Modules accepted: Orders

## 2022-03-19 DIAGNOSIS — I2510 Atherosclerotic heart disease of native coronary artery without angina pectoris: Secondary | ICD-10-CM | POA: Diagnosis not present

## 2022-03-19 DIAGNOSIS — H409 Unspecified glaucoma: Secondary | ICD-10-CM | POA: Diagnosis not present

## 2022-03-19 DIAGNOSIS — T83511A Infection and inflammatory reaction due to indwelling urethral catheter, initial encounter: Secondary | ICD-10-CM | POA: Diagnosis not present

## 2022-03-19 DIAGNOSIS — R338 Other retention of urine: Secondary | ICD-10-CM | POA: Diagnosis not present

## 2022-03-19 DIAGNOSIS — K5909 Other constipation: Secondary | ICD-10-CM | POA: Diagnosis not present

## 2022-03-19 DIAGNOSIS — D539 Nutritional anemia, unspecified: Secondary | ICD-10-CM | POA: Diagnosis not present

## 2022-03-19 DIAGNOSIS — H9190 Unspecified hearing loss, unspecified ear: Secondary | ICD-10-CM | POA: Diagnosis not present

## 2022-03-19 DIAGNOSIS — K4020 Bilateral inguinal hernia, without obstruction or gangrene, not specified as recurrent: Secondary | ICD-10-CM | POA: Diagnosis not present

## 2022-03-19 DIAGNOSIS — T8131XA Disruption of external operation (surgical) wound, not elsewhere classified, initial encounter: Secondary | ICD-10-CM | POA: Diagnosis not present

## 2022-03-19 DIAGNOSIS — E785 Hyperlipidemia, unspecified: Secondary | ICD-10-CM | POA: Diagnosis not present

## 2022-03-19 DIAGNOSIS — K5289 Other specified noninfective gastroenteritis and colitis: Secondary | ICD-10-CM | POA: Diagnosis not present

## 2022-03-19 DIAGNOSIS — E1122 Type 2 diabetes mellitus with diabetic chronic kidney disease: Secondary | ICD-10-CM | POA: Diagnosis not present

## 2022-03-19 DIAGNOSIS — I252 Old myocardial infarction: Secondary | ICD-10-CM | POA: Diagnosis not present

## 2022-03-19 DIAGNOSIS — I129 Hypertensive chronic kidney disease with stage 1 through stage 4 chronic kidney disease, or unspecified chronic kidney disease: Secondary | ICD-10-CM | POA: Diagnosis not present

## 2022-03-19 DIAGNOSIS — N390 Urinary tract infection, site not specified: Secondary | ICD-10-CM | POA: Diagnosis not present

## 2022-03-19 DIAGNOSIS — D472 Monoclonal gammopathy: Secondary | ICD-10-CM | POA: Diagnosis not present

## 2022-03-19 DIAGNOSIS — F03918 Unspecified dementia, unspecified severity, with other behavioral disturbance: Secondary | ICD-10-CM | POA: Diagnosis not present

## 2022-03-19 DIAGNOSIS — N1832 Chronic kidney disease, stage 3b: Secondary | ICD-10-CM | POA: Diagnosis not present

## 2022-03-19 DIAGNOSIS — Z7982 Long term (current) use of aspirin: Secondary | ICD-10-CM | POA: Diagnosis not present

## 2022-03-20 DIAGNOSIS — H409 Unspecified glaucoma: Secondary | ICD-10-CM | POA: Diagnosis not present

## 2022-03-20 DIAGNOSIS — T83511A Infection and inflammatory reaction due to indwelling urethral catheter, initial encounter: Secondary | ICD-10-CM | POA: Diagnosis not present

## 2022-03-20 DIAGNOSIS — D539 Nutritional anemia, unspecified: Secondary | ICD-10-CM | POA: Diagnosis not present

## 2022-03-20 DIAGNOSIS — E1122 Type 2 diabetes mellitus with diabetic chronic kidney disease: Secondary | ICD-10-CM | POA: Diagnosis not present

## 2022-03-20 DIAGNOSIS — N390 Urinary tract infection, site not specified: Secondary | ICD-10-CM | POA: Diagnosis not present

## 2022-03-20 DIAGNOSIS — I2510 Atherosclerotic heart disease of native coronary artery without angina pectoris: Secondary | ICD-10-CM | POA: Diagnosis not present

## 2022-03-20 DIAGNOSIS — R338 Other retention of urine: Secondary | ICD-10-CM | POA: Diagnosis not present

## 2022-03-20 DIAGNOSIS — I129 Hypertensive chronic kidney disease with stage 1 through stage 4 chronic kidney disease, or unspecified chronic kidney disease: Secondary | ICD-10-CM | POA: Diagnosis not present

## 2022-03-20 DIAGNOSIS — Z7982 Long term (current) use of aspirin: Secondary | ICD-10-CM | POA: Diagnosis not present

## 2022-03-20 DIAGNOSIS — I252 Old myocardial infarction: Secondary | ICD-10-CM | POA: Diagnosis not present

## 2022-03-20 DIAGNOSIS — D472 Monoclonal gammopathy: Secondary | ICD-10-CM | POA: Diagnosis not present

## 2022-03-20 DIAGNOSIS — E785 Hyperlipidemia, unspecified: Secondary | ICD-10-CM | POA: Diagnosis not present

## 2022-03-20 DIAGNOSIS — K4020 Bilateral inguinal hernia, without obstruction or gangrene, not specified as recurrent: Secondary | ICD-10-CM | POA: Diagnosis not present

## 2022-03-20 DIAGNOSIS — H9190 Unspecified hearing loss, unspecified ear: Secondary | ICD-10-CM | POA: Diagnosis not present

## 2022-03-20 DIAGNOSIS — K5909 Other constipation: Secondary | ICD-10-CM | POA: Diagnosis not present

## 2022-03-20 DIAGNOSIS — F03918 Unspecified dementia, unspecified severity, with other behavioral disturbance: Secondary | ICD-10-CM | POA: Diagnosis not present

## 2022-03-20 DIAGNOSIS — N1832 Chronic kidney disease, stage 3b: Secondary | ICD-10-CM | POA: Diagnosis not present

## 2022-03-20 DIAGNOSIS — K5289 Other specified noninfective gastroenteritis and colitis: Secondary | ICD-10-CM | POA: Diagnosis not present

## 2022-03-20 DIAGNOSIS — T8131XA Disruption of external operation (surgical) wound, not elsewhere classified, initial encounter: Secondary | ICD-10-CM | POA: Diagnosis not present

## 2022-03-21 DIAGNOSIS — K5289 Other specified noninfective gastroenteritis and colitis: Secondary | ICD-10-CM | POA: Diagnosis not present

## 2022-03-21 DIAGNOSIS — I129 Hypertensive chronic kidney disease with stage 1 through stage 4 chronic kidney disease, or unspecified chronic kidney disease: Secondary | ICD-10-CM | POA: Diagnosis not present

## 2022-03-21 DIAGNOSIS — T83511A Infection and inflammatory reaction due to indwelling urethral catheter, initial encounter: Secondary | ICD-10-CM | POA: Diagnosis not present

## 2022-03-21 DIAGNOSIS — K4020 Bilateral inguinal hernia, without obstruction or gangrene, not specified as recurrent: Secondary | ICD-10-CM | POA: Diagnosis not present

## 2022-03-21 DIAGNOSIS — H409 Unspecified glaucoma: Secondary | ICD-10-CM | POA: Diagnosis not present

## 2022-03-21 DIAGNOSIS — T8131XA Disruption of external operation (surgical) wound, not elsewhere classified, initial encounter: Secondary | ICD-10-CM | POA: Diagnosis not present

## 2022-03-21 DIAGNOSIS — I2510 Atherosclerotic heart disease of native coronary artery without angina pectoris: Secondary | ICD-10-CM | POA: Diagnosis not present

## 2022-03-21 DIAGNOSIS — F03918 Unspecified dementia, unspecified severity, with other behavioral disturbance: Secondary | ICD-10-CM | POA: Diagnosis not present

## 2022-03-21 DIAGNOSIS — H9190 Unspecified hearing loss, unspecified ear: Secondary | ICD-10-CM | POA: Diagnosis not present

## 2022-03-21 DIAGNOSIS — E1122 Type 2 diabetes mellitus with diabetic chronic kidney disease: Secondary | ICD-10-CM | POA: Diagnosis not present

## 2022-03-21 DIAGNOSIS — N1832 Chronic kidney disease, stage 3b: Secondary | ICD-10-CM | POA: Diagnosis not present

## 2022-03-21 DIAGNOSIS — K5909 Other constipation: Secondary | ICD-10-CM | POA: Diagnosis not present

## 2022-03-21 DIAGNOSIS — N390 Urinary tract infection, site not specified: Secondary | ICD-10-CM | POA: Diagnosis not present

## 2022-03-21 DIAGNOSIS — I252 Old myocardial infarction: Secondary | ICD-10-CM | POA: Diagnosis not present

## 2022-03-21 DIAGNOSIS — Z7982 Long term (current) use of aspirin: Secondary | ICD-10-CM | POA: Diagnosis not present

## 2022-03-21 DIAGNOSIS — D539 Nutritional anemia, unspecified: Secondary | ICD-10-CM | POA: Diagnosis not present

## 2022-03-21 DIAGNOSIS — E785 Hyperlipidemia, unspecified: Secondary | ICD-10-CM | POA: Diagnosis not present

## 2022-03-21 DIAGNOSIS — D472 Monoclonal gammopathy: Secondary | ICD-10-CM | POA: Diagnosis not present

## 2022-03-21 DIAGNOSIS — R338 Other retention of urine: Secondary | ICD-10-CM | POA: Diagnosis not present

## 2022-03-24 ENCOUNTER — Telehealth: Payer: Self-pay | Admitting: *Deleted

## 2022-03-24 DIAGNOSIS — R269 Unspecified abnormalities of gait and mobility: Secondary | ICD-10-CM | POA: Diagnosis not present

## 2022-03-24 DIAGNOSIS — M6281 Muscle weakness (generalized): Secondary | ICD-10-CM | POA: Diagnosis not present

## 2022-03-24 NOTE — Telephone Encounter (Signed)
Barbaraann Faster, RN  C/o pain bilateral toes and legs. Wants pcp to help with pain control and a podiatrist referral. Joelene Millin L. Lavina Hamman, RN, BSN, [place] Coordinator  [phone]

## 2022-03-25 DIAGNOSIS — F03918 Unspecified dementia, unspecified severity, with other behavioral disturbance: Secondary | ICD-10-CM | POA: Diagnosis not present

## 2022-03-25 DIAGNOSIS — D472 Monoclonal gammopathy: Secondary | ICD-10-CM | POA: Diagnosis not present

## 2022-03-25 DIAGNOSIS — T83511A Infection and inflammatory reaction due to indwelling urethral catheter, initial encounter: Secondary | ICD-10-CM | POA: Diagnosis not present

## 2022-03-25 DIAGNOSIS — D539 Nutritional anemia, unspecified: Secondary | ICD-10-CM | POA: Diagnosis not present

## 2022-03-25 DIAGNOSIS — E785 Hyperlipidemia, unspecified: Secondary | ICD-10-CM | POA: Diagnosis not present

## 2022-03-25 DIAGNOSIS — H9190 Unspecified hearing loss, unspecified ear: Secondary | ICD-10-CM | POA: Diagnosis not present

## 2022-03-25 DIAGNOSIS — N1832 Chronic kidney disease, stage 3b: Secondary | ICD-10-CM | POA: Diagnosis not present

## 2022-03-25 DIAGNOSIS — E1122 Type 2 diabetes mellitus with diabetic chronic kidney disease: Secondary | ICD-10-CM | POA: Diagnosis not present

## 2022-03-25 DIAGNOSIS — I252 Old myocardial infarction: Secondary | ICD-10-CM | POA: Diagnosis not present

## 2022-03-25 DIAGNOSIS — H409 Unspecified glaucoma: Secondary | ICD-10-CM | POA: Diagnosis not present

## 2022-03-25 DIAGNOSIS — K4020 Bilateral inguinal hernia, without obstruction or gangrene, not specified as recurrent: Secondary | ICD-10-CM | POA: Diagnosis not present

## 2022-03-25 DIAGNOSIS — T8131XA Disruption of external operation (surgical) wound, not elsewhere classified, initial encounter: Secondary | ICD-10-CM | POA: Diagnosis not present

## 2022-03-25 DIAGNOSIS — I129 Hypertensive chronic kidney disease with stage 1 through stage 4 chronic kidney disease, or unspecified chronic kidney disease: Secondary | ICD-10-CM | POA: Diagnosis not present

## 2022-03-25 DIAGNOSIS — K5909 Other constipation: Secondary | ICD-10-CM | POA: Diagnosis not present

## 2022-03-25 DIAGNOSIS — Z7982 Long term (current) use of aspirin: Secondary | ICD-10-CM | POA: Diagnosis not present

## 2022-03-25 DIAGNOSIS — R338 Other retention of urine: Secondary | ICD-10-CM | POA: Diagnosis not present

## 2022-03-25 DIAGNOSIS — I2510 Atherosclerotic heart disease of native coronary artery without angina pectoris: Secondary | ICD-10-CM | POA: Diagnosis not present

## 2022-03-25 DIAGNOSIS — K5289 Other specified noninfective gastroenteritis and colitis: Secondary | ICD-10-CM | POA: Diagnosis not present

## 2022-03-25 DIAGNOSIS — N390 Urinary tract infection, site not specified: Secondary | ICD-10-CM | POA: Diagnosis not present

## 2022-03-25 NOTE — Telephone Encounter (Signed)
Contacted patient and was informed , patient scheduled for an appt tomorrow

## 2022-03-25 NOTE — Telephone Encounter (Signed)
Thersa Salt G, DO  This is the first that I have heard about complaints of pain. Needs to be seen if he is experiencing significant pain.

## 2022-03-26 ENCOUNTER — Ambulatory Visit (INDEPENDENT_AMBULATORY_CARE_PROVIDER_SITE_OTHER): Payer: Medicare Other | Admitting: Family Medicine

## 2022-03-26 DIAGNOSIS — B351 Tinea unguium: Secondary | ICD-10-CM

## 2022-03-26 DIAGNOSIS — L853 Xerosis cutis: Secondary | ICD-10-CM | POA: Diagnosis not present

## 2022-03-26 DIAGNOSIS — Q845 Enlarged and hypertrophic nails: Secondary | ICD-10-CM | POA: Insufficient documentation
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
## 2022-03-26 NOTE — Assessment & Plan Note (Signed)
Referring to dermatology. 

## 2022-03-26 NOTE — Patient Instructions (Signed)
Referrals are in.   Take care  Dr. Lacinda Axon

## 2022-03-26 NOTE — Assessment & Plan Note (Signed)
Causing pain.  Referring to podiatry.

## 2022-03-27 DIAGNOSIS — T83511A Infection and inflammatory reaction due to indwelling urethral catheter, initial encounter: Secondary | ICD-10-CM | POA: Diagnosis not present

## 2022-03-27 DIAGNOSIS — E785 Hyperlipidemia, unspecified: Secondary | ICD-10-CM | POA: Diagnosis not present

## 2022-03-27 DIAGNOSIS — D539 Nutritional anemia, unspecified: Secondary | ICD-10-CM | POA: Diagnosis not present

## 2022-03-27 DIAGNOSIS — K4020 Bilateral inguinal hernia, without obstruction or gangrene, not specified as recurrent: Secondary | ICD-10-CM | POA: Diagnosis not present

## 2022-03-27 DIAGNOSIS — H9190 Unspecified hearing loss, unspecified ear: Secondary | ICD-10-CM | POA: Diagnosis not present

## 2022-03-27 DIAGNOSIS — K5289 Other specified noninfective gastroenteritis and colitis: Secondary | ICD-10-CM | POA: Diagnosis not present

## 2022-03-27 DIAGNOSIS — F03918 Unspecified dementia, unspecified severity, with other behavioral disturbance: Secondary | ICD-10-CM | POA: Diagnosis not present

## 2022-03-27 DIAGNOSIS — R338 Other retention of urine: Secondary | ICD-10-CM | POA: Diagnosis not present

## 2022-03-27 DIAGNOSIS — T8131XA Disruption of external operation (surgical) wound, not elsewhere classified, initial encounter: Secondary | ICD-10-CM | POA: Diagnosis not present

## 2022-03-27 DIAGNOSIS — N390 Urinary tract infection, site not specified: Secondary | ICD-10-CM | POA: Diagnosis not present

## 2022-03-27 DIAGNOSIS — K5909 Other constipation: Secondary | ICD-10-CM | POA: Diagnosis not present

## 2022-03-27 DIAGNOSIS — D472 Monoclonal gammopathy: Secondary | ICD-10-CM | POA: Diagnosis not present

## 2022-03-27 DIAGNOSIS — I2510 Atherosclerotic heart disease of native coronary artery without angina pectoris: Secondary | ICD-10-CM | POA: Diagnosis not present

## 2022-03-27 DIAGNOSIS — I129 Hypertensive chronic kidney disease with stage 1 through stage 4 chronic kidney disease, or unspecified chronic kidney disease: Secondary | ICD-10-CM | POA: Diagnosis not present

## 2022-03-27 DIAGNOSIS — Z7982 Long term (current) use of aspirin: Secondary | ICD-10-CM | POA: Diagnosis not present

## 2022-03-27 DIAGNOSIS — I252 Old myocardial infarction: Secondary | ICD-10-CM | POA: Diagnosis not present

## 2022-03-27 DIAGNOSIS — H409 Unspecified glaucoma: Secondary | ICD-10-CM | POA: Diagnosis not present

## 2022-03-27 DIAGNOSIS — E1122 Type 2 diabetes mellitus with diabetic chronic kidney disease: Secondary | ICD-10-CM | POA: Diagnosis not present

## 2022-03-27 DIAGNOSIS — N1832 Chronic kidney disease, stage 3b: Secondary | ICD-10-CM | POA: Diagnosis not present

## 2022-03-28 DIAGNOSIS — E1122 Type 2 diabetes mellitus with diabetic chronic kidney disease: Secondary | ICD-10-CM | POA: Diagnosis not present

## 2022-03-28 DIAGNOSIS — D539 Nutritional anemia, unspecified: Secondary | ICD-10-CM | POA: Diagnosis not present

## 2022-03-28 DIAGNOSIS — H9190 Unspecified hearing loss, unspecified ear: Secondary | ICD-10-CM | POA: Diagnosis not present

## 2022-03-28 DIAGNOSIS — D472 Monoclonal gammopathy: Secondary | ICD-10-CM | POA: Diagnosis not present

## 2022-03-28 DIAGNOSIS — N1832 Chronic kidney disease, stage 3b: Secondary | ICD-10-CM | POA: Diagnosis not present

## 2022-03-28 DIAGNOSIS — T8131XA Disruption of external operation (surgical) wound, not elsewhere classified, initial encounter: Secondary | ICD-10-CM | POA: Diagnosis not present

## 2022-03-28 DIAGNOSIS — Z7982 Long term (current) use of aspirin: Secondary | ICD-10-CM | POA: Diagnosis not present

## 2022-03-28 DIAGNOSIS — K5909 Other constipation: Secondary | ICD-10-CM | POA: Diagnosis not present

## 2022-03-28 DIAGNOSIS — N390 Urinary tract infection, site not specified: Secondary | ICD-10-CM | POA: Diagnosis not present

## 2022-03-28 DIAGNOSIS — E785 Hyperlipidemia, unspecified: Secondary | ICD-10-CM | POA: Diagnosis not present

## 2022-03-28 DIAGNOSIS — I252 Old myocardial infarction: Secondary | ICD-10-CM | POA: Diagnosis not present

## 2022-03-28 DIAGNOSIS — R338 Other retention of urine: Secondary | ICD-10-CM | POA: Diagnosis not present

## 2022-03-28 DIAGNOSIS — K5289 Other specified noninfective gastroenteritis and colitis: Secondary | ICD-10-CM | POA: Diagnosis not present

## 2022-03-28 DIAGNOSIS — F03918 Unspecified dementia, unspecified severity, with other behavioral disturbance: Secondary | ICD-10-CM | POA: Diagnosis not present

## 2022-03-28 DIAGNOSIS — T83511A Infection and inflammatory reaction due to indwelling urethral catheter, initial encounter: Secondary | ICD-10-CM | POA: Diagnosis not present

## 2022-03-28 DIAGNOSIS — I129 Hypertensive chronic kidney disease with stage 1 through stage 4 chronic kidney disease, or unspecified chronic kidney disease: Secondary | ICD-10-CM | POA: Diagnosis not present

## 2022-03-28 DIAGNOSIS — H409 Unspecified glaucoma: Secondary | ICD-10-CM | POA: Diagnosis not present

## 2022-03-28 DIAGNOSIS — K4020 Bilateral inguinal hernia, without obstruction or gangrene, not specified as recurrent: Secondary | ICD-10-CM | POA: Diagnosis not present

## 2022-03-28 DIAGNOSIS — I2510 Atherosclerotic heart disease of native coronary artery without angina pectoris: Secondary | ICD-10-CM | POA: Diagnosis not present

## 2022-03-31 DIAGNOSIS — D472 Monoclonal gammopathy: Secondary | ICD-10-CM | POA: Diagnosis not present

## 2022-03-31 DIAGNOSIS — I129 Hypertensive chronic kidney disease with stage 1 through stage 4 chronic kidney disease, or unspecified chronic kidney disease: Secondary | ICD-10-CM | POA: Diagnosis not present

## 2022-03-31 DIAGNOSIS — R338 Other retention of urine: Secondary | ICD-10-CM | POA: Diagnosis not present

## 2022-03-31 DIAGNOSIS — K5909 Other constipation: Secondary | ICD-10-CM | POA: Diagnosis not present

## 2022-03-31 DIAGNOSIS — K5289 Other specified noninfective gastroenteritis and colitis: Secondary | ICD-10-CM | POA: Diagnosis not present

## 2022-03-31 DIAGNOSIS — E785 Hyperlipidemia, unspecified: Secondary | ICD-10-CM | POA: Diagnosis not present

## 2022-03-31 DIAGNOSIS — T8131XA Disruption of external operation (surgical) wound, not elsewhere classified, initial encounter: Secondary | ICD-10-CM | POA: Diagnosis not present

## 2022-03-31 DIAGNOSIS — Z7982 Long term (current) use of aspirin: Secondary | ICD-10-CM | POA: Diagnosis not present

## 2022-03-31 DIAGNOSIS — H409 Unspecified glaucoma: Secondary | ICD-10-CM | POA: Diagnosis not present

## 2022-03-31 DIAGNOSIS — N1832 Chronic kidney disease, stage 3b: Secondary | ICD-10-CM | POA: Diagnosis not present

## 2022-03-31 DIAGNOSIS — T83511A Infection and inflammatory reaction due to indwelling urethral catheter, initial encounter: Secondary | ICD-10-CM | POA: Diagnosis not present

## 2022-03-31 DIAGNOSIS — D539 Nutritional anemia, unspecified: Secondary | ICD-10-CM | POA: Diagnosis not present

## 2022-03-31 DIAGNOSIS — N390 Urinary tract infection, site not specified: Secondary | ICD-10-CM | POA: Diagnosis not present

## 2022-03-31 DIAGNOSIS — I2510 Atherosclerotic heart disease of native coronary artery without angina pectoris: Secondary | ICD-10-CM | POA: Diagnosis not present

## 2022-03-31 DIAGNOSIS — E1122 Type 2 diabetes mellitus with diabetic chronic kidney disease: Secondary | ICD-10-CM | POA: Diagnosis not present

## 2022-03-31 DIAGNOSIS — F03918 Unspecified dementia, unspecified severity, with other behavioral disturbance: Secondary | ICD-10-CM | POA: Diagnosis not present

## 2022-03-31 DIAGNOSIS — I252 Old myocardial infarction: Secondary | ICD-10-CM | POA: Diagnosis not present

## 2022-03-31 DIAGNOSIS — H9190 Unspecified hearing loss, unspecified ear: Secondary | ICD-10-CM | POA: Diagnosis not present

## 2022-03-31 DIAGNOSIS — K4020 Bilateral inguinal hernia, without obstruction or gangrene, not specified as recurrent: Secondary | ICD-10-CM | POA: Diagnosis not present

## 2022-04-01 ENCOUNTER — Ambulatory Visit: Payer: Medicare Other | Admitting: Podiatry

## 2022-04-01 ENCOUNTER — Encounter: Payer: Self-pay | Admitting: Podiatry

## 2022-04-01 DIAGNOSIS — B351 Tinea unguium: Secondary | ICD-10-CM | POA: Diagnosis not present

## 2022-04-01 DIAGNOSIS — M79675 Pain in left toe(s): Secondary | ICD-10-CM | POA: Diagnosis not present

## 2022-04-01 DIAGNOSIS — M79674 Pain in right toe(s): Secondary | ICD-10-CM | POA: Diagnosis not present

## 2022-04-02 DIAGNOSIS — D539 Nutritional anemia, unspecified: Secondary | ICD-10-CM | POA: Diagnosis not present

## 2022-04-02 DIAGNOSIS — T8131XA Disruption of external operation (surgical) wound, not elsewhere classified, initial encounter: Secondary | ICD-10-CM | POA: Diagnosis not present

## 2022-04-02 DIAGNOSIS — I2510 Atherosclerotic heart disease of native coronary artery without angina pectoris: Secondary | ICD-10-CM | POA: Diagnosis not present

## 2022-04-02 DIAGNOSIS — K5289 Other specified noninfective gastroenteritis and colitis: Secondary | ICD-10-CM | POA: Diagnosis not present

## 2022-04-02 DIAGNOSIS — E1122 Type 2 diabetes mellitus with diabetic chronic kidney disease: Secondary | ICD-10-CM | POA: Diagnosis not present

## 2022-04-02 DIAGNOSIS — T83511A Infection and inflammatory reaction due to indwelling urethral catheter, initial encounter: Secondary | ICD-10-CM | POA: Diagnosis not present

## 2022-04-02 DIAGNOSIS — N390 Urinary tract infection, site not specified: Secondary | ICD-10-CM | POA: Diagnosis not present

## 2022-04-02 DIAGNOSIS — K5909 Other constipation: Secondary | ICD-10-CM | POA: Diagnosis not present

## 2022-04-02 DIAGNOSIS — R338 Other retention of urine: Secondary | ICD-10-CM | POA: Diagnosis not present

## 2022-04-02 DIAGNOSIS — E785 Hyperlipidemia, unspecified: Secondary | ICD-10-CM | POA: Diagnosis not present

## 2022-04-02 DIAGNOSIS — I252 Old myocardial infarction: Secondary | ICD-10-CM | POA: Diagnosis not present

## 2022-04-02 DIAGNOSIS — I129 Hypertensive chronic kidney disease with stage 1 through stage 4 chronic kidney disease, or unspecified chronic kidney disease: Secondary | ICD-10-CM | POA: Diagnosis not present

## 2022-04-02 DIAGNOSIS — D472 Monoclonal gammopathy: Secondary | ICD-10-CM | POA: Diagnosis not present

## 2022-04-02 DIAGNOSIS — H9190 Unspecified hearing loss, unspecified ear: Secondary | ICD-10-CM | POA: Diagnosis not present

## 2022-04-02 DIAGNOSIS — H409 Unspecified glaucoma: Secondary | ICD-10-CM | POA: Diagnosis not present

## 2022-04-02 DIAGNOSIS — N1832 Chronic kidney disease, stage 3b: Secondary | ICD-10-CM | POA: Diagnosis not present

## 2022-04-02 DIAGNOSIS — K4020 Bilateral inguinal hernia, without obstruction or gangrene, not specified as recurrent: Secondary | ICD-10-CM | POA: Diagnosis not present

## 2022-04-02 DIAGNOSIS — Z7982 Long term (current) use of aspirin: Secondary | ICD-10-CM | POA: Diagnosis not present

## 2022-04-02 DIAGNOSIS — F03918 Unspecified dementia, unspecified severity, with other behavioral disturbance: Secondary | ICD-10-CM | POA: Diagnosis not present

## 2022-04-03 NOTE — Progress Notes (Signed)
  Subjective:  Patient ID: Terrance Miller, male    DOB: [birth year],  MRN: [id number]  Chief Complaint  Patient presents with   Nail Problem    Onychomycosis New Patient  Q84.5 (ICD-10-CM) - Enlarged and hypertrophic nails  Referring Provider: Coral Spikes      89 y.o. male presents with the above complaint. History confirmed with patient.  The nails are thickened if they are causing pain in shoes  Objective:  Physical Exam: warm, good capillary refill, no trophic changes or ulcerative lesions, normal DP and PT pulses, and normal sensory exam. Left Foot: dystrophic yellowed discolored nail plates with subungual debris Right Foot: dystrophic yellowed discolored nail plates with subungual debris   Assessment:   1. Pain due to onychomycosis of toenails of both feet      Plan:  Patient was evaluated and treated and all questions answered.   Discussed the etiology and treatment options for the condition in detail with the patient.  Do not think he would be a good candidate for oral therapy, topical therapy not likely to be effective. Recommended debridement of the nails today. Sharp and mechanical debridement performed of all painful and mycotic nails today. Nails debrided in length and thickness using a nail nipper to level of comfort. Discussed treatment options including appropriate shoe gear. Follow up as needed for painful nails.     Return in about 3 months (around 07/02/2022) for painful thick fungal nails.

## 2022-04-11 DIAGNOSIS — D539 Nutritional anemia, unspecified: Secondary | ICD-10-CM | POA: Diagnosis not present

## 2022-04-11 DIAGNOSIS — I2510 Atherosclerotic heart disease of native coronary artery without angina pectoris: Secondary | ICD-10-CM | POA: Diagnosis not present

## 2022-04-11 DIAGNOSIS — H409 Unspecified glaucoma: Secondary | ICD-10-CM | POA: Diagnosis not present

## 2022-04-11 DIAGNOSIS — E785 Hyperlipidemia, unspecified: Secondary | ICD-10-CM | POA: Diagnosis not present

## 2022-04-11 DIAGNOSIS — N390 Urinary tract infection, site not specified: Secondary | ICD-10-CM | POA: Diagnosis not present

## 2022-04-11 DIAGNOSIS — T8131XA Disruption of external operation (surgical) wound, not elsewhere classified, initial encounter: Secondary | ICD-10-CM | POA: Diagnosis not present

## 2022-04-11 DIAGNOSIS — I129 Hypertensive chronic kidney disease with stage 1 through stage 4 chronic kidney disease, or unspecified chronic kidney disease: Secondary | ICD-10-CM | POA: Diagnosis not present

## 2022-04-11 DIAGNOSIS — D472 Monoclonal gammopathy: Secondary | ICD-10-CM | POA: Diagnosis not present

## 2022-04-11 DIAGNOSIS — R338 Other retention of urine: Secondary | ICD-10-CM | POA: Diagnosis not present

## 2022-04-11 DIAGNOSIS — F03918 Unspecified dementia, unspecified severity, with other behavioral disturbance: Secondary | ICD-10-CM | POA: Diagnosis not present

## 2022-04-11 DIAGNOSIS — Z7982 Long term (current) use of aspirin: Secondary | ICD-10-CM | POA: Diagnosis not present

## 2022-04-11 DIAGNOSIS — K4020 Bilateral inguinal hernia, without obstruction or gangrene, not specified as recurrent: Secondary | ICD-10-CM | POA: Diagnosis not present

## 2022-04-11 DIAGNOSIS — K5289 Other specified noninfective gastroenteritis and colitis: Secondary | ICD-10-CM | POA: Diagnosis not present

## 2022-04-11 DIAGNOSIS — T83511A Infection and inflammatory reaction due to indwelling urethral catheter, initial encounter: Secondary | ICD-10-CM | POA: Diagnosis not present

## 2022-04-11 DIAGNOSIS — N1832 Chronic kidney disease, stage 3b: Secondary | ICD-10-CM | POA: Diagnosis not present

## 2022-04-11 DIAGNOSIS — H9190 Unspecified hearing loss, unspecified ear: Secondary | ICD-10-CM | POA: Diagnosis not present

## 2022-04-11 DIAGNOSIS — K5909 Other constipation: Secondary | ICD-10-CM | POA: Diagnosis not present

## 2022-04-11 DIAGNOSIS — I252 Old myocardial infarction: Secondary | ICD-10-CM | POA: Diagnosis not present

## 2022-04-11 DIAGNOSIS — E1122 Type 2 diabetes mellitus with diabetic chronic kidney disease: Secondary | ICD-10-CM | POA: Diagnosis not present

## 2022-04-15 ENCOUNTER — Ambulatory Visit (INDEPENDENT_AMBULATORY_CARE_PROVIDER_SITE_OTHER): Payer: Medicare Other | Admitting: Family Medicine

## 2022-04-15 DIAGNOSIS — R338 Other retention of urine: Secondary | ICD-10-CM | POA: Diagnosis not present

## 2022-04-15 DIAGNOSIS — I10 Essential (primary) hypertension: Secondary | ICD-10-CM | POA: Diagnosis not present

## 2022-04-15 DIAGNOSIS — L853 Xerosis cutis: Secondary | ICD-10-CM | POA: Diagnosis not present

## 2022-04-15 NOTE — Patient Instructions (Signed)
I reached out to urology and dermatology.  Waiting to here back.  Follow up in 6 months.  Take care  Dr. Adriana Simas

## 2022-04-16 ENCOUNTER — Telehealth: Payer: Self-pay

## 2022-04-16 ENCOUNTER — Ambulatory Visit: Payer: Self-pay | Admitting: *Deleted

## 2022-04-16 NOTE — Patient Outreach (Addendum)
  Care Coordination   Follow Up Visit Note   04/16/2022 Name: Terrance Miller MRN: [id number] DOB: 09-Jan-1928  Terrance Miller is a 89 y.o. year old male who sees [name], DO for primary care. I spoke with  Terrance Miller by phone today.  What matters to the patients health and wellness today?  Pain of Toe & leg resolved after seeing Podiatrist, Dr Lilian Kapur in [place] - to follow up in 3 months  Urinary home management- Stopped intake of decaf coffee Agrees to the suggested teas Need follow up on voiding trial  [name], daughter was informed by a [facility] well nurse that they will replace the foley catheter Home health nurse generally visits Mondays, Wednesdays and Fridays. The last visit was on 04/11/22 for home health re certification  Equipment  [name] discussed a need for new larger bedside commode & wheelchair as the present ones are too small after a weight gain (150 to 209 lbs) Equipment was previously obtained from [employer] in less than a year       Goals Addressed             This Visit's Progress    COMPLETED: pain in toe & leg (THN)   On track    Care Coordination Interventions:  Seen by new podiatrist and toe & leg pain resolved      [facility] care coordination services   Not on track    Interventions Today    Flowsheet Row Most Recent Value  Chronic Disease   Chronic disease during today's visit Other  [Resolved pain of toe/leg after podiatry seen, urinary tract infection prevention, Inquiry if foley is to be replaced, cost efficient equipment needs -larger bedside commode & wheelchair as he has gained weight (150 to 209 lbs)]  General Interventions   General Interventions Discussed/Reviewed General Interventions Reviewed, Communication with, Durable Medical Equipment (DME), Doctor Visits  Doctor Visits Discussed/Reviewed Doctor Visits Reviewed, PCP, Specialist  Durable Medical Equipment (DME) Bed side commode, Wheelchair  [facility] staff states  out of pocket coverage for a wheelchair is about $290 & $57 for a bedside commode]  Wheelchair Standard  PCP/Specialist Visits Compliance with follow-up visit  Communication with PCP/Specialists, RN  [employer] message for Dr Belva Crome triage nurse. sent a message to pcp office for orders for wheelchair & bedside commode. Spoke with [employer] about assisting with equipment. Confirmed that the equipment would not be covered by insurance.]  Exercise Interventions   Exercise Discussed/Reviewed Exercise Discussed, Physical Activity  Physical Activity Discussed/Reviewed Physical Activity Discussed  Education Interventions   Education Provided Provided Education  Provided Verbal Education On Nutrition, Community Resources, Other  [Decaf coffee, urine color changes, equipment companies, medicare guidelines for equipment purchase, bariatric equipment, discounted equipment options if not covered by insurance]  Nutrition Interventions   Nutrition Discussed/Reviewed Nutrition Discussed, Fluid intake  [Discussed intake of teas vs decaf coffee]              SDOH assessments and interventions completed:  No     Care Coordination Interventions:  Yes, provided   Follow up plan: Follow up call scheduled for 04/23/22 1 pm    Encounter Outcome:  Pt. Visit Completed   Jesusmanuel Erbes L. Noelle Penner, RN, BSN, CCM [facility] Care Management Community Coordinator Office number [phone]

## 2022-04-16 NOTE — Assessment & Plan Note (Signed)
Awaiting referral to dermatology.
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
## 2022-04-16 NOTE — Telephone Encounter (Signed)
Terrance Miller nurse coordinator from [facility] called and left a message about cath change/placement. Return call to patient's daughter Terrance Miller. Terrance Miller voiced that she wanting to know the next steps with patient catheter... do patient need to keep catheter or could a voiding trial be possibly. Patient had a voiding trial on 03/13/22 with  bladder spasm and was unable to pass voiding trial.  Terrance Miller states that patient has home health through [facility] needing an order for catheter change and a follow up appointment. Terrance Miller is aware that a task will be sent to MD and once MD give recommendation someone will reach out with MD recommendation

## 2022-04-16 NOTE — Telephone Encounter (Signed)
Open in error

## 2022-04-16 NOTE — Assessment & Plan Note (Signed)
BP mildly elevated here today.  Fair control given advanced age and comorbidity.  Continue carvedilol and amlodipine.

## 2022-04-16 NOTE — Assessment & Plan Note (Signed)
Needs additional voiding trial.  Called and left a message with urology.

## 2022-04-16 NOTE — Patient Instructions (Signed)
Visit Information  Thank you for taking time to visit with me today. Please don't hesitate to contact me if I can be of assistance to you.   Following are the goals we discussed today:   Goals Addressed             This Visit's Progress    COMPLETED: pain in toe & leg ([facility])   On track    Care Coordination Interventions:  Seen by new podiatrist and toe & leg pain resolved      [facility] care coordination services   Not on track    Interventions Today    Flowsheet Row Most Recent Value  Chronic Disease   Chronic disease during today's visit Other  [Resolved pain of toe/leg after podiatry seen, urinary tract infection prevention, Inquiry if foley is to be replaced, cost efficient equipment needs -larger bedside commode & wheelchair as he has gained weight (150 to 209 lbs)]  General Interventions   General Interventions Discussed/Reviewed General Interventions Reviewed, Communication with, Durable Medical Equipment (DME), Doctor Visits  Doctor Visits Discussed/Reviewed Doctor Visits Reviewed, PCP, Specialist  Durable Medical Equipment (DME) Bed side commode, Wheelchair  [facility] staff states out of pocket coverage for a wheelchair is about $290 & $57 for a bedside commode]  Wheelchair Standard  PCP/Specialist Visits Compliance with follow-up visit  Communication with PCP/Specialists, RN  [employer] message for Dr Belva Crome triage nurse. sent a message to pcp office for orders for wheelchair & bedside commode. Spoke with [employer] about assisting with equipment. Confirmed that the equipment would not be covered by insurance.]  Exercise Interventions   Exercise Discussed/Reviewed Exercise Discussed, Physical Activity  Physical Activity Discussed/Reviewed Physical Activity Discussed  Education Interventions   Education Provided Provided Education  Provided Verbal Education On Nutrition, Community Resources, Other  [Decaf coffee, urine color changes, equipment companies,  medicare guidelines for equipment purchase, bariatric equipment, discounted equipment options if not covered by insurance]  Nutrition Interventions   Nutrition Discussed/Reviewed Nutrition Discussed, Fluid intake  [Discussed intake of teas vs decaf coffee]              Our next appointment is by telephone on 04/23/22 at 1 pm  Please call the care guide team at [phone] if you need to cancel or reschedule your appointment.   If you are experiencing a Mental Health or Behavioral Health Crisis or need someone to talk to, please call the Suicide and Crisis Lifeline: 988 call the [place] National Suicide Prevention Lifeline: (704) 317-2995 or TTY: 701-595-0045 TTY 316 652 6571) to talk to a trained counselor call 1-800-273-TALK (toll free, 24 hour hotline) call the [facility]: [phone] call 911   The patient verbalized understanding of instructions, educational materials, and care plan provided today and DECLINED offer to receive copy of patient instructions, educational materials, and care plan.   The patient has been provided with contact information for the care management team and has been advised to call with any health related questions or concerns.   Sutton Hirsch L. Noelle Penner, RN, BSN, CCM [facility] Care Management Community Coordinator Office number [phone]

## 2022-04-17 DIAGNOSIS — T83511A Infection and inflammatory reaction due to indwelling urethral catheter, initial encounter: Secondary | ICD-10-CM | POA: Diagnosis not present

## 2022-04-17 DIAGNOSIS — D539 Nutritional anemia, unspecified: Secondary | ICD-10-CM | POA: Diagnosis not present

## 2022-04-17 DIAGNOSIS — N390 Urinary tract infection, site not specified: Secondary | ICD-10-CM | POA: Diagnosis not present

## 2022-04-17 DIAGNOSIS — D472 Monoclonal gammopathy: Secondary | ICD-10-CM | POA: Diagnosis not present

## 2022-04-17 DIAGNOSIS — N1832 Chronic kidney disease, stage 3b: Secondary | ICD-10-CM | POA: Diagnosis not present

## 2022-04-17 DIAGNOSIS — E1122 Type 2 diabetes mellitus with diabetic chronic kidney disease: Secondary | ICD-10-CM | POA: Diagnosis not present

## 2022-04-17 DIAGNOSIS — I2510 Atherosclerotic heart disease of native coronary artery without angina pectoris: Secondary | ICD-10-CM | POA: Diagnosis not present

## 2022-04-17 DIAGNOSIS — F03918 Unspecified dementia, unspecified severity, with other behavioral disturbance: Secondary | ICD-10-CM | POA: Diagnosis not present

## 2022-04-17 DIAGNOSIS — K5289 Other specified noninfective gastroenteritis and colitis: Secondary | ICD-10-CM | POA: Diagnosis not present

## 2022-04-17 DIAGNOSIS — T8131XA Disruption of external operation (surgical) wound, not elsewhere classified, initial encounter: Secondary | ICD-10-CM | POA: Diagnosis not present

## 2022-04-17 DIAGNOSIS — Z7982 Long term (current) use of aspirin: Secondary | ICD-10-CM | POA: Diagnosis not present

## 2022-04-17 DIAGNOSIS — E785 Hyperlipidemia, unspecified: Secondary | ICD-10-CM | POA: Diagnosis not present

## 2022-04-17 DIAGNOSIS — I129 Hypertensive chronic kidney disease with stage 1 through stage 4 chronic kidney disease, or unspecified chronic kidney disease: Secondary | ICD-10-CM | POA: Diagnosis not present

## 2022-04-17 DIAGNOSIS — K4020 Bilateral inguinal hernia, without obstruction or gangrene, not specified as recurrent: Secondary | ICD-10-CM | POA: Diagnosis not present

## 2022-04-17 DIAGNOSIS — R338 Other retention of urine: Secondary | ICD-10-CM | POA: Diagnosis not present

## 2022-04-17 DIAGNOSIS — I252 Old myocardial infarction: Secondary | ICD-10-CM | POA: Diagnosis not present

## 2022-04-17 DIAGNOSIS — K5909 Other constipation: Secondary | ICD-10-CM | POA: Diagnosis not present

## 2022-04-17 DIAGNOSIS — H409 Unspecified glaucoma: Secondary | ICD-10-CM | POA: Diagnosis not present

## 2022-04-17 DIAGNOSIS — H9190 Unspecified hearing loss, unspecified ear: Secondary | ICD-10-CM | POA: Diagnosis not present

## 2022-04-18 ENCOUNTER — Ambulatory Visit (INDEPENDENT_AMBULATORY_CARE_PROVIDER_SITE_OTHER): Payer: Medicare Other | Admitting: Urology

## 2022-04-18 DIAGNOSIS — N433 Hydrocele, unspecified: Secondary | ICD-10-CM

## 2022-04-18 DIAGNOSIS — R339 Retention of urine, unspecified: Secondary | ICD-10-CM

## 2022-04-18 DIAGNOSIS — N401 Enlarged prostate with lower urinary tract symptoms: Secondary | ICD-10-CM

## 2022-04-18 DIAGNOSIS — N138 Other obstructive and reflux uropathy: Secondary | ICD-10-CM

## 2022-04-18 NOTE — Progress Notes (Signed)
Simple Catheter Placement  Due to urinary retention patient is present today for a foley cath placement.  Patient was cleaned and prepped in a sterile fashion with betadine. A 18 silicone FR foley catheter was inserted, urine return was noted  5 ml, urine was yellow in color.  The balloon was filled with 10cc of sterile water.  A night bag was attached for drainage. Patient was also given a night bag to take home and was given instruction on how to change from one bag to another.  Patient was given instruction on proper catheter care.  Patient tolerated well, no complications were noted   Performed by: Kennyth Lose, CMA/[facility], LPN  Additional notes/ Follow up: Keep scheduled office visit

## 2022-04-18 NOTE — Telephone Encounter (Signed)
Patient 's daughter [name] voiced that patient will like to keep catheter and not to proceed with the voiding trial because patient is having having inconstancy issue. [name] is aware that a message will be to the MD. [name] voiced understanding.

## 2022-04-23 ENCOUNTER — Ambulatory Visit: Payer: Self-pay | Admitting: *Deleted

## 2022-04-23 NOTE — Patient Outreach (Signed)
  Care Coordination   Follow Up Visit Note   08/12/2022 late entry for 04/23/22 Name: Shakeal Weatherington MRN: [id number] DOB: September 26, 1928  Hartman Stillson is a 89 y.o. year old male who sees [name], DO for primary care. I spoke with  [name] by phone today.  What matters to the patients health and wellness today?  confirmed he has his foley replaced. Mr Castella states he is doing well with the catheter.   Have not received equipment orders   Goals Addressed             This Visit's Progress    [facility] care coordination services       Interventions Today    Flowsheet Row Most Recent Value  Chronic Disease   Chronic disease during today's visit Other  [DME order/goodwill search]  General Interventions   General Interventions Discussed/Reviewed General Interventions Reviewed, Durable Medical Equipment (DME), Doctor Visits  Doctor Visits Discussed/Reviewed Doctor Visits Reviewed, PCP, Specialist  Durable Medical Equipment (DME) Wheelchair  Wheelchair Standard  PCP/Specialist Visits Compliance with follow-up visit  Education Interventions   Education Provided Provided Education  [use of goodwill for DME not covered by insurance]              SDOH assessments and interventions completed:  No     Care Coordination Interventions:  Yes, provided   Follow up plan: Follow up call scheduled for 08/12/22    Encounter Outcome:  Pt. Visit Completed    L. Noelle Penner, RN, BSN, CCM [facility] Care Management Community Coordinator Office number [phone]

## 2022-04-24 DIAGNOSIS — K5909 Other constipation: Secondary | ICD-10-CM | POA: Diagnosis not present

## 2022-04-24 DIAGNOSIS — Z7982 Long term (current) use of aspirin: Secondary | ICD-10-CM | POA: Diagnosis not present

## 2022-04-24 DIAGNOSIS — H409 Unspecified glaucoma: Secondary | ICD-10-CM | POA: Diagnosis not present

## 2022-04-24 DIAGNOSIS — H9190 Unspecified hearing loss, unspecified ear: Secondary | ICD-10-CM | POA: Diagnosis not present

## 2022-04-24 DIAGNOSIS — K5289 Other specified noninfective gastroenteritis and colitis: Secondary | ICD-10-CM | POA: Diagnosis not present

## 2022-04-24 DIAGNOSIS — E1122 Type 2 diabetes mellitus with diabetic chronic kidney disease: Secondary | ICD-10-CM | POA: Diagnosis not present

## 2022-04-24 DIAGNOSIS — E785 Hyperlipidemia, unspecified: Secondary | ICD-10-CM | POA: Diagnosis not present

## 2022-04-24 DIAGNOSIS — N1832 Chronic kidney disease, stage 3b: Secondary | ICD-10-CM | POA: Diagnosis not present

## 2022-04-24 DIAGNOSIS — N390 Urinary tract infection, site not specified: Secondary | ICD-10-CM | POA: Diagnosis not present

## 2022-04-24 DIAGNOSIS — I2510 Atherosclerotic heart disease of native coronary artery without angina pectoris: Secondary | ICD-10-CM | POA: Diagnosis not present

## 2022-04-24 DIAGNOSIS — T83511A Infection and inflammatory reaction due to indwelling urethral catheter, initial encounter: Secondary | ICD-10-CM | POA: Diagnosis not present

## 2022-04-24 DIAGNOSIS — F03918 Unspecified dementia, unspecified severity, with other behavioral disturbance: Secondary | ICD-10-CM | POA: Diagnosis not present

## 2022-04-24 DIAGNOSIS — M6281 Muscle weakness (generalized): Secondary | ICD-10-CM | POA: Diagnosis not present

## 2022-04-24 DIAGNOSIS — K4020 Bilateral inguinal hernia, without obstruction or gangrene, not specified as recurrent: Secondary | ICD-10-CM | POA: Diagnosis not present

## 2022-04-24 DIAGNOSIS — T8131XA Disruption of external operation (surgical) wound, not elsewhere classified, initial encounter: Secondary | ICD-10-CM | POA: Diagnosis not present

## 2022-04-24 DIAGNOSIS — I129 Hypertensive chronic kidney disease with stage 1 through stage 4 chronic kidney disease, or unspecified chronic kidney disease: Secondary | ICD-10-CM | POA: Diagnosis not present

## 2022-04-24 DIAGNOSIS — D539 Nutritional anemia, unspecified: Secondary | ICD-10-CM | POA: Diagnosis not present

## 2022-04-24 DIAGNOSIS — R338 Other retention of urine: Secondary | ICD-10-CM | POA: Diagnosis not present

## 2022-04-24 DIAGNOSIS — R269 Unspecified abnormalities of gait and mobility: Secondary | ICD-10-CM | POA: Diagnosis not present

## 2022-04-24 DIAGNOSIS — D472 Monoclonal gammopathy: Secondary | ICD-10-CM | POA: Diagnosis not present

## 2022-04-24 DIAGNOSIS — I252 Old myocardial infarction: Secondary | ICD-10-CM | POA: Diagnosis not present

## 2022-04-30 NOTE — Telephone Encounter (Signed)
Patient's daughter [name] is aware that patient can keep catheter per Dr. Retta Diones. [name] voiced understanding

## 2022-05-02 DIAGNOSIS — H9190 Unspecified hearing loss, unspecified ear: Secondary | ICD-10-CM | POA: Diagnosis not present

## 2022-05-02 DIAGNOSIS — D539 Nutritional anemia, unspecified: Secondary | ICD-10-CM | POA: Diagnosis not present

## 2022-05-02 DIAGNOSIS — I129 Hypertensive chronic kidney disease with stage 1 through stage 4 chronic kidney disease, or unspecified chronic kidney disease: Secondary | ICD-10-CM | POA: Diagnosis not present

## 2022-05-02 DIAGNOSIS — I2510 Atherosclerotic heart disease of native coronary artery without angina pectoris: Secondary | ICD-10-CM | POA: Diagnosis not present

## 2022-05-02 DIAGNOSIS — F03918 Unspecified dementia, unspecified severity, with other behavioral disturbance: Secondary | ICD-10-CM | POA: Diagnosis not present

## 2022-05-02 DIAGNOSIS — K5289 Other specified noninfective gastroenteritis and colitis: Secondary | ICD-10-CM | POA: Diagnosis not present

## 2022-05-02 DIAGNOSIS — R338 Other retention of urine: Secondary | ICD-10-CM | POA: Diagnosis not present

## 2022-05-02 DIAGNOSIS — Z7982 Long term (current) use of aspirin: Secondary | ICD-10-CM | POA: Diagnosis not present

## 2022-05-02 DIAGNOSIS — E785 Hyperlipidemia, unspecified: Secondary | ICD-10-CM | POA: Diagnosis not present

## 2022-05-02 DIAGNOSIS — K4020 Bilateral inguinal hernia, without obstruction or gangrene, not specified as recurrent: Secondary | ICD-10-CM | POA: Diagnosis not present

## 2022-05-02 DIAGNOSIS — H409 Unspecified glaucoma: Secondary | ICD-10-CM | POA: Diagnosis not present

## 2022-05-02 DIAGNOSIS — N390 Urinary tract infection, site not specified: Secondary | ICD-10-CM | POA: Diagnosis not present

## 2022-05-02 DIAGNOSIS — D472 Monoclonal gammopathy: Secondary | ICD-10-CM | POA: Diagnosis not present

## 2022-05-02 DIAGNOSIS — E1122 Type 2 diabetes mellitus with diabetic chronic kidney disease: Secondary | ICD-10-CM | POA: Diagnosis not present

## 2022-05-02 DIAGNOSIS — T83511A Infection and inflammatory reaction due to indwelling urethral catheter, initial encounter: Secondary | ICD-10-CM | POA: Diagnosis not present

## 2022-05-02 DIAGNOSIS — I252 Old myocardial infarction: Secondary | ICD-10-CM | POA: Diagnosis not present

## 2022-05-02 DIAGNOSIS — N1832 Chronic kidney disease, stage 3b: Secondary | ICD-10-CM | POA: Diagnosis not present

## 2022-05-02 DIAGNOSIS — K5909 Other constipation: Secondary | ICD-10-CM | POA: Diagnosis not present

## 2022-05-02 DIAGNOSIS — T8131XA Disruption of external operation (surgical) wound, not elsewhere classified, initial encounter: Secondary | ICD-10-CM | POA: Diagnosis not present

## 2022-05-05 NOTE — Progress Notes (Signed)
History of Present Illness: This man returns for follow-up. of recent hospitalization for catheter related urinary tract infection.   Urologic history dates back to November 2023.  He underwent bilateral hydrocelectomy by Dr. Pete Glatter.  The patient developed wound dehiscence which has been treated with dressing changes since that time.  He has an indwelling Foley catheter.  Initially, we were looking towards voiding trial because of difficult catheter change as well as the family's desire.  However, recently they have decided to keep the catheter in.  He has had continued healing of his scrotal wound.  He has been getting his catheter changed monthly-first at home, now in our office.  There is apparently not a real issue with difficult catheter change here.  For now, he would like to continue his Foley catheter as he has limited mobility.  Does have fecal incontinence.  He is wearing a diaper.    Past Medical History:  Diagnosis Date   Acute ST elevation myocardial infarction (STEMI) of inferior wall (HCC) 2004   Coronary artery disease    DES x2 to the RCA 2004, residual disease managed medically   Hyperlipidemia    Hypertension    Type 2 diabetes mellitus (HCC)     Past Surgical History:  Procedure Laterality Date   BIOPSY  01/28/2022   Procedure: BIOPSY;  Surgeon: Marguerita Merles, Reuel Boom, MD;  Location: [facility];  Service: Gastroenterology;;   CATARACT EXTRACTION Bilateral    CORONARY ANGIOPLASTY WITH STENT PLACEMENT  09/05/2002   stent RCA, 80% first diagonal, 70-80% mid-diagonal, 80% mid LAD stenosis   FLEXIBLE SIGMOIDOSCOPY N/A 01/28/2022   Procedure: FLEXIBLE SIGMOIDOSCOPY;  Surgeon: Dolores Frame, MD;  Location: [facility];  Service: Gastroenterology;  Laterality: N/A;   HYDROCELE EXCISION Bilateral 11/20/2021   Procedure: HYDROCELECTOMY ADULT;  Surgeon: Milderd Meager., MD;  Location: [facility];  Service: Urology;  Laterality: Bilateral;    IMPACTION REMOVAL  01/28/2022   Procedure: IMPACTION REMOVAL;  Surgeon: Dolores Frame, MD;  Location: [facility];  Service: Gastroenterology;;   NM MYOCAR PERF WALL MOTION  11/01/2008   Normal    Home Medications:  Allergies as of 05/06/2022   No Known Allergies      Medication List        Accurate as of May 05, 2022  9:19 PM. If you have any questions, ask your nurse or doctor.          amLODipine 5 MG tablet Commonly known as: NORVASC Take 1 tablet (5 mg total) by mouth daily.   aspirin EC 81 MG tablet Take 1 tablet (81 mg total) by mouth daily with breakfast. Swallow whole.   bisacodyl 10 MG suppository Commonly known as: DULCOLAX Place 1 suppository (10 mg total) rectally every Monday, Wednesday, and Friday.   carvedilol 6.25 MG tablet Commonly known as: COREG Take 1 tablet (6.25 mg total) by mouth 2 (two) times daily.   cholecalciferol 1000 units tablet Commonly known as: VITAMIN D Take 1,000 Units by mouth daily.   clobetasol ointment 0.05 % Commonly known as: TEMOVATE Apply 1 Application topically daily.   cyanocobalamin 1000 MCG tablet Commonly known as: VITAMIN B12 Take 1,000 mcg by mouth daily.   hydrocortisone 25 MG suppository Commonly known as: ANUSOL-HC Place 1 suppository (25 mg total) rectally every Tuesday, Thursday, Saturday, and Sunday.   linezolid 600 MG tablet Commonly known as: ZYVOX Take 1 tablet (600 mg total) by mouth every 12 (twelve) hours.   polyethylene glycol  17 g packet Commonly known as: MIRALAX / GLYCOLAX Take 17 g by mouth 2 (two) times daily.   rosuvastatin 5 MG tablet Commonly known as: CRESTOR Take 1 tablet (5 mg total) by mouth daily.   tamsulosin 0.4 MG Caps capsule Commonly known as: FLOMAX Take 0.4 mg by mouth daily.   Travatan Z 0.004 % Soln ophthalmic solution Generic drug: Travoprost (BAK Free) Place 1 drop into both eyes 2 (two) times daily.        Allergies: No Known  Allergies  Family History  Problem Relation Age of Onset   Hypertension Father    Diabetes Sister     Social History:  reports that he quit smoking about 16 years ago. His smoking use included cigarettes. He smoked an average of .25 packs per day. He has never used smokeless tobacco. He reports that he does not currently use alcohol. He reports that he does not use drugs.  ROS: A complete review of systems was performed.  All systems are negative except for pertinent findings as noted.  Physical Exam:  Vital signs in last 24 hours: There were no vitals taken for this visit. Constitutional:  Alert and oriented, No acute distress Cardiovascular: Regular rate  Respiratory: Normal respiratory effort Neurologic: Grossly intact, no focal deficits Psychiatric: Normal mood and affect  I have reviewed prior pt notes    Impression/Assessment:  1.  History of bilateral hydroceles with hydrocelectomy and wound dehiscence, now healed  2.  BPH with obstruction/retention.  Currently catheter dependent  Plan:  1.  For now, I will have him come back monthly for catheter change for nursing visit  2.  I will have him come back to see me in 4 to 6 months

## 2022-05-06 ENCOUNTER — Ambulatory Visit (INDEPENDENT_AMBULATORY_CARE_PROVIDER_SITE_OTHER): Payer: Medicare Other | Admitting: Urology

## 2022-05-06 ENCOUNTER — Encounter: Payer: Self-pay | Admitting: Urology

## 2022-05-06 VITALS — BP 132/79 | HR 73

## 2022-05-06 DIAGNOSIS — N138 Other obstructive and reflux uropathy: Secondary | ICD-10-CM

## 2022-05-06 DIAGNOSIS — R339 Retention of urine, unspecified: Secondary | ICD-10-CM

## 2022-05-06 DIAGNOSIS — N433 Hydrocele, unspecified: Secondary | ICD-10-CM

## 2022-05-06 DIAGNOSIS — Z87438 Personal history of other diseases of male genital organs: Secondary | ICD-10-CM

## 2022-05-06 DIAGNOSIS — N401 Enlarged prostate with lower urinary tract symptoms: Secondary | ICD-10-CM | POA: Diagnosis not present

## 2022-05-06 DIAGNOSIS — T8131XD Disruption of external operation (surgical) wound, not elsewhere classified, subsequent encounter: Secondary | ICD-10-CM

## 2022-05-15 DIAGNOSIS — N1832 Chronic kidney disease, stage 3b: Secondary | ICD-10-CM | POA: Diagnosis not present

## 2022-05-15 DIAGNOSIS — Z7982 Long term (current) use of aspirin: Secondary | ICD-10-CM | POA: Diagnosis not present

## 2022-05-15 DIAGNOSIS — T83511A Infection and inflammatory reaction due to indwelling urethral catheter, initial encounter: Secondary | ICD-10-CM | POA: Diagnosis not present

## 2022-05-15 DIAGNOSIS — K5289 Other specified noninfective gastroenteritis and colitis: Secondary | ICD-10-CM | POA: Diagnosis not present

## 2022-05-15 DIAGNOSIS — D539 Nutritional anemia, unspecified: Secondary | ICD-10-CM | POA: Diagnosis not present

## 2022-05-15 DIAGNOSIS — K5909 Other constipation: Secondary | ICD-10-CM | POA: Diagnosis not present

## 2022-05-15 DIAGNOSIS — F03918 Unspecified dementia, unspecified severity, with other behavioral disturbance: Secondary | ICD-10-CM | POA: Diagnosis not present

## 2022-05-15 DIAGNOSIS — K4020 Bilateral inguinal hernia, without obstruction or gangrene, not specified as recurrent: Secondary | ICD-10-CM | POA: Diagnosis not present

## 2022-05-15 DIAGNOSIS — I2510 Atherosclerotic heart disease of native coronary artery without angina pectoris: Secondary | ICD-10-CM | POA: Diagnosis not present

## 2022-05-15 DIAGNOSIS — N390 Urinary tract infection, site not specified: Secondary | ICD-10-CM | POA: Diagnosis not present

## 2022-05-15 DIAGNOSIS — E1122 Type 2 diabetes mellitus with diabetic chronic kidney disease: Secondary | ICD-10-CM | POA: Diagnosis not present

## 2022-05-15 DIAGNOSIS — T8131XA Disruption of external operation (surgical) wound, not elsewhere classified, initial encounter: Secondary | ICD-10-CM | POA: Diagnosis not present

## 2022-05-15 DIAGNOSIS — R338 Other retention of urine: Secondary | ICD-10-CM | POA: Diagnosis not present

## 2022-05-15 DIAGNOSIS — H409 Unspecified glaucoma: Secondary | ICD-10-CM | POA: Diagnosis not present

## 2022-05-15 DIAGNOSIS — D472 Monoclonal gammopathy: Secondary | ICD-10-CM | POA: Diagnosis not present

## 2022-05-15 DIAGNOSIS — I252 Old myocardial infarction: Secondary | ICD-10-CM | POA: Diagnosis not present

## 2022-05-15 DIAGNOSIS — I129 Hypertensive chronic kidney disease with stage 1 through stage 4 chronic kidney disease, or unspecified chronic kidney disease: Secondary | ICD-10-CM | POA: Diagnosis not present

## 2022-05-15 DIAGNOSIS — E785 Hyperlipidemia, unspecified: Secondary | ICD-10-CM | POA: Diagnosis not present

## 2022-05-15 DIAGNOSIS — H9190 Unspecified hearing loss, unspecified ear: Secondary | ICD-10-CM | POA: Diagnosis not present

## 2022-05-20 ENCOUNTER — Ambulatory Visit (INDEPENDENT_AMBULATORY_CARE_PROVIDER_SITE_OTHER): Payer: Medicare Other | Admitting: Urology

## 2022-05-20 DIAGNOSIS — R339 Retention of urine, unspecified: Secondary | ICD-10-CM

## 2022-05-20 NOTE — Progress Notes (Signed)
Cath Change/ Replacement  Patient is present today for a catheter change due to urinary retention.  8 ml of water was removed from the balloon, a 18 FR silicone foley cath was removed without difficulty.  Patient was cleaned and prepped in a sterile fashion with betadine and 2% lidocaine jelly was instilled into the urethra. A 18 FR  silicone foley cath was replaced into the bladder by Dr. Lynnae Sandhoff. Urine return was noted 8ml and urine was yellow in color. The balloon was filled with 10ml of sterile water. A night  bag was attached for drainage.  A night bag was also given to the patient and patient was given instruction on how to change from one bag to another. Patient was given proper instruction on catheter care.    Performed by: Maryland Pink   CMA  Follow up: f/up as scheduled

## 2022-05-20 NOTE — Progress Notes (Signed)
MD assistance needed for catheter change.  Catheterization was difficult due to significant swelling of foreskin.  However, with adequate traction, meatus was visualized and catheter was successfully placed by me.

## 2022-05-24 DIAGNOSIS — R269 Unspecified abnormalities of gait and mobility: Secondary | ICD-10-CM | POA: Diagnosis not present

## 2022-05-24 DIAGNOSIS — M6281 Muscle weakness (generalized): Secondary | ICD-10-CM | POA: Diagnosis not present

## 2022-06-12 ENCOUNTER — Ambulatory Visit (INDEPENDENT_AMBULATORY_CARE_PROVIDER_SITE_OTHER): Payer: Medicare Other

## 2022-06-12 DIAGNOSIS — R339 Retention of urine, unspecified: Secondary | ICD-10-CM

## 2022-06-12 DIAGNOSIS — T83098A Other mechanical complication of other indwelling urethral catheter, initial encounter: Secondary | ICD-10-CM | POA: Diagnosis not present

## 2022-06-12 NOTE — Progress Notes (Signed)
Patient in office needing new catheter bag. Patient's bag replaced.

## 2022-06-19 ENCOUNTER — Ambulatory Visit: Payer: Medicare Other

## 2022-06-24 DIAGNOSIS — R269 Unspecified abnormalities of gait and mobility: Secondary | ICD-10-CM | POA: Diagnosis not present

## 2022-06-24 DIAGNOSIS — M6281 Muscle weakness (generalized): Secondary | ICD-10-CM | POA: Diagnosis not present

## 2022-07-01 ENCOUNTER — Ambulatory Visit (INDEPENDENT_AMBULATORY_CARE_PROVIDER_SITE_OTHER): Payer: Medicare Other

## 2022-07-01 DIAGNOSIS — R339 Retention of urine, unspecified: Secondary | ICD-10-CM | POA: Diagnosis not present

## 2022-07-01 MED ORDER — CIPROFLOXACIN HCL 500 MG PO TABS
500.0000 mg | ORAL_TABLET | Freq: Once | ORAL | Status: AC
  Administered 2022-07-01: 500 mg via ORAL

## 2022-07-01 NOTE — Progress Notes (Signed)
Cath Change/ Replacement  Patient is present today for a catheter change due to urinary retention.  10ml of water was removed from the balloon, a 18 F silcone foley cath was removed without difficulty.  Patient was cleaned and prepped in a sterile fashion with betadine and 2% lidocaine jelly was instilled into the urethra. A 18 f silicone FR foley cath was replaced into the bladder, no complications were noted. Urine return was noted 10ml and urine was yellow in color. The balloon was filled with 10ml of sterile water. A bed bag was attached for drainage.  A night bag was also given to the patient and patient was given instruction on how to change from one bag to another. Patient was given proper instruction on catheter care.    Performed by: [place]. LPN  Follow up: I 1 month

## 2022-07-02 ENCOUNTER — Ambulatory Visit: Payer: Medicare Other | Admitting: Podiatry

## 2022-07-07 ENCOUNTER — Telehealth: Payer: Self-pay

## 2022-07-07 NOTE — Telephone Encounter (Signed)
Pt needs order sent to [facility] for bariatric commode seat large one  the one he has is to small   Call back [phone]

## 2022-07-09 NOTE — Telephone Encounter (Signed)
Left message to return call with family member. 

## 2022-07-09 NOTE — Telephone Encounter (Signed)
Everlene Other G, DO     If he needs face to face, please have them schedule.

## 2022-07-11 NOTE — Telephone Encounter (Signed)
Patient scheduled 07/29/22 at 11 am with Dr Adriana Simas

## 2022-07-17 ENCOUNTER — Other Ambulatory Visit: Payer: Self-pay | Admitting: Family Medicine

## 2022-07-24 DIAGNOSIS — M6281 Muscle weakness (generalized): Secondary | ICD-10-CM | POA: Diagnosis not present

## 2022-07-24 DIAGNOSIS — R269 Unspecified abnormalities of gait and mobility: Secondary | ICD-10-CM | POA: Diagnosis not present

## 2022-07-25 ENCOUNTER — Ambulatory Visit (INDEPENDENT_AMBULATORY_CARE_PROVIDER_SITE_OTHER): Payer: Medicare Other

## 2022-07-25 ENCOUNTER — Telehealth: Payer: Self-pay

## 2022-07-25 VITALS — Ht 67.0 in | Wt 209.0 lb

## 2022-07-25 DIAGNOSIS — Z0100 Encounter for examination of eyes and vision without abnormal findings: Secondary | ICD-10-CM

## 2022-07-25 DIAGNOSIS — Z789 Other specified health status: Secondary | ICD-10-CM

## 2022-07-25 DIAGNOSIS — Z0000 Encounter for general adult medical examination without abnormal findings: Secondary | ICD-10-CM

## 2022-07-25 DIAGNOSIS — Z748 Other problems related to care provider dependency: Secondary | ICD-10-CM

## 2022-07-25 NOTE — Patient Instructions (Signed)
Terrance Miller , Thank you for taking time to come for your Medicare Wellness Visit. I appreciate your ongoing commitment to your health goals. Please review the following plan we discussed and let me know if I can assist you in the future.   These are the goals we discussed:  Goals       Exercise 3x per week (30 min per time)      Continue to stay active and healthy.       Patient Stated (pt-stated)      To get up and travel       [facility] care coordination services      Interventions Today    Flowsheet Row Most Recent Value  Chronic Disease   Chronic disease during today's visit Other  [Resolved pain of toe/leg after podiatry seen, urinary tract infection prevention, Inquiry if foley is to be replaced, cost efficient equipment needs -larger bedside commode & wheelchair as he has gained weight (150 to 209 lbs)]  General Interventions   General Interventions Discussed/Reviewed General Interventions Reviewed, Communication with, Durable Medical Equipment (DME), Doctor Visits  Doctor Visits Discussed/Reviewed Doctor Visits Reviewed, PCP, Specialist  Durable Medical Equipment (DME) Bed side commode, Wheelchair  [facility] staff states out of pocket coverage for a wheelchair is about $290 & $57 for a bedside commode]  Wheelchair Standard  PCP/Specialist Visits Compliance with follow-up visit  Communication with PCP/Specialists, RN  [employer] message for Dr Belva Crome triage nurse. sent a message to pcp office for orders for wheelchair & bedside commode. Spoke with [employer] about assisting with equipment. Confirmed that the equipment would not be covered by insurance.]  Exercise Interventions   Exercise Discussed/Reviewed Exercise Discussed, Physical Activity  Physical Activity Discussed/Reviewed Physical Activity Discussed  Education Interventions   Education Provided Provided Education  Provided Verbal Education On Nutrition, Community Resources, Other  [Decaf coffee, urine  color changes, equipment companies, medicare guidelines for equipment purchase, bariatric equipment, discounted equipment options if not covered by insurance]  Nutrition Interventions   Nutrition Discussed/Reviewed Nutrition Discussed, Fluid intake  [Discussed intake of teas vs decaf coffee]              This is a list of the screening recommended for you and due dates:  Health Maintenance  Topic Date Due   COVID-19 Vaccine (1) Never done   DTaP/Tdap/Td vaccine (1 - Tdap) Never done   Zoster (Shingles) Vaccine (1 of 2) Never done   Pneumonia Vaccine (1 of 1 - PCV) Never done   Medicare Annual Wellness Visit  07/17/2022   Flu Shot  08/07/2022   HPV Vaccine  Aged Out    Advanced directives: Advance directive discussed with you today. Even though you declined this today, please call our office should you change your mind, and we can give you the proper paperwork for you to fill out. Advance care planning is a way to make decisions about medical care that fits your values in case you are ever unable to make these decisions for yourself.  Information on Advanced Care Planning can be found at [facility] Advance Health Care Directives Advance Health Care Directives ([email or web address]    Conditions/risks identified:  A referral for an eye doctor has been placed as well as a referral to see what resources are available for transportation and care   Next appointment: VIRTUAL/ TELEPHONE VISIT Follow up in one year for your annual wellness visit  July 31, 2023 at 1:30 pm telephone visit.  Preventive Care 33 Years and Older, Male  Preventive care refers to lifestyle choices and visits with your health care provider that can promote health and wellness. What does preventive care include? A yearly physical exam. This is also called an annual well check. Dental exams once or twice a year. Routine eye exams. Ask your health care provider how often you should have your eyes  checked. Personal lifestyle choices, including: Daily care of your teeth and gums. Regular physical activity. Eating a healthy diet. Avoiding tobacco and drug use. Limiting alcohol use. Practicing safe sex. Taking low doses of aspirin every day. Taking vitamin and mineral supplements as recommended by your health care provider. What happens during an annual well check? The services and screenings done by your health care provider during your annual well check will depend on your age, overall health, lifestyle risk factors, and family history of disease. Counseling  Your health care provider may ask you questions about your: Alcohol use. Tobacco use. Drug use. Emotional well-being. Home and relationship well-being. Sexual activity. Eating habits. History of falls. Memory and ability to understand (cognition). Work and work Astronomer. Screening  You may have the following tests or measurements: Height, weight, and BMI. Blood pressure. Lipid and cholesterol levels. These may be checked every 5 years, or more frequently if you are over 54 years old. Skin check. Lung cancer screening. You may have this screening every year starting at age 6 if you have a 30-pack-year history of smoking and currently smoke or have quit within the past 15 years. Fecal occult blood test (FOBT) of the stool. You may have this test every year starting at age 10. Flexible sigmoidoscopy or colonoscopy. You may have a sigmoidoscopy every 5 years or a colonoscopy every 10 years starting at age 25. Prostate cancer screening. Recommendations will vary depending on your family history and other risks. Hepatitis C blood test. Hepatitis B blood test. Sexually transmitted disease (STD) testing. Diabetes screening. This is done by checking your blood sugar (glucose) after you have not eaten for a while (fasting). You may have this done every 1-3 years. Abdominal aortic aneurysm (AAA) screening. You may need this  if you are a current or former smoker. Osteoporosis. You may be screened starting at age 76 if you are at high risk. Talk with your health care provider about your test results, treatment options, and if necessary, the need for more tests. Vaccines  Your health care provider may recommend certain vaccines, such as: Influenza vaccine. This is recommended every year. Tetanus, diphtheria, and acellular pertussis (Tdap, Td) vaccine. You may need a Td booster every 10 years. Zoster vaccine. You may need this after age 46. Pneumococcal 13-valent conjugate (PCV13) vaccine. One dose is recommended after age 72. Pneumococcal polysaccharide (PPSV23) vaccine. One dose is recommended after age 62. Talk to your health care provider about which screenings and vaccines you need and how often you need them. This information is not intended to replace advice given to you by your health care provider. Make sure you discuss any questions you have with your health care provider. Document Released: 01/19/2015 Document Revised: 09/12/2015 Document Reviewed: 10/24/2014 Elsevier Interactive Patient Education  2017 [employer].  Fall Prevention in the Home Falls can cause injuries. They can happen to people of all ages. There are many things you can do to make your home safe and to help prevent falls. What can I do on the outside of my home? Regularly fix the edges of walkways and  driveways and fix any cracks. Remove anything that might make you trip as you walk through a door, such as a raised step or threshold. Trim any bushes or trees on the path to your home. Use bright outdoor lighting. Clear any walking paths of anything that might make someone trip, such as rocks or tools. Regularly check to see if handrails are loose or broken. Make sure that both sides of any steps have handrails. Any raised decks and porches should have guardrails on the edges. Have any leaves, snow, or ice cleared regularly. Use sand  or salt on walking paths during winter. Clean up any spills in your garage right away. This includes oil or grease spills. What can I do in the bathroom? Use night lights. Install grab bars by the toilet and in the tub and shower. Do not use towel bars as grab bars. Use non-skid mats or decals in the tub or shower. If you need to sit down in the shower, use a plastic, non-slip stool. Keep the floor dry. Clean up any water that spills on the floor as soon as it happens. Remove soap buildup in the tub or shower regularly. Attach bath mats securely with double-sided non-slip rug tape. Do not have throw rugs and other things on the floor that can make you trip. What can I do in the bedroom? Use night lights. Make sure that you have a light by your bed that is easy to reach. Do not use any sheets or blankets that are too big for your bed. They should not hang down onto the floor. Have a firm chair that has side arms. You can use this for support while you get dressed. Do not have throw rugs and other things on the floor that can make you trip. What can I do in the kitchen? Clean up any spills right away. Avoid walking on wet floors. Keep items that you use a lot in easy-to-reach places. If you need to reach something above you, use a strong step stool that has a grab bar. Keep electrical cords out of the way. Do not use floor polish or wax that makes floors slippery. If you must use wax, use non-skid floor wax. Do not have throw rugs and other things on the floor that can make you trip. What can I do with my stairs? Do not leave any items on the stairs. Make sure that there are handrails on both sides of the stairs and use them. Fix handrails that are broken or loose. Make sure that handrails are as long as the stairways. Check any carpeting to make sure that it is firmly attached to the stairs. Fix any carpet that is loose or worn. Avoid having throw rugs at the top or bottom of the stairs.  If you do have throw rugs, attach them to the floor with carpet tape. Make sure that you have a light switch at the top of the stairs and the bottom of the stairs. If you do not have them, ask someone to add them for you. What else can I do to help prevent falls? Wear shoes that: Do not have high heels. Have rubber bottoms. Are comfortable and fit you well. Are closed at the toe. Do not wear sandals. If you use a stepladder: Make sure that it is fully opened. Do not climb a closed stepladder. Make sure that both sides of the stepladder are locked into place. Ask someone to hold it for you, if possible. Clearly  mark and make sure that you can see: Any grab bars or handrails. First and last steps. Where the edge of each step is. Use tools that help you move around (mobility aids) if they are needed. These include: Canes. Walkers. Scooters. Crutches. Turn on the lights when you go into a dark area. Replace any light bulbs as soon as they burn out. Set up your furniture so you have a clear path. Avoid moving your furniture around. If any of your floors are uneven, fix them. If there are any pets around you, be aware of where they are. Review your medicines with your doctor. Some medicines can make you feel dizzy. This can increase your chance of falling. Ask your doctor what other things that you can do to help prevent falls. This information is not intended to replace advice given to you by your health care provider. Make sure you discuss any questions you have with your health care provider. Document Released: 10/19/2008 Document Revised: 05/31/2015 Document Reviewed: 01/27/2014 Elsevier Interactive Patient Education  2017 [employer].
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
## 2022-07-25 NOTE — Telephone Encounter (Signed)
AWVS completed today with help from daughter [name]. After the visit, she spoke with me and stated that she needs help finding placement for him.She is no longer able to physically provide him with the care that he needs and he is requiring more attention and physical care than before. Please contact [name] to discuss. Patient aware that provider is being sent a message.

## 2022-07-28 ENCOUNTER — Ambulatory Visit (INDEPENDENT_AMBULATORY_CARE_PROVIDER_SITE_OTHER): Payer: Medicare Other | Admitting: Podiatry

## 2022-07-28 ENCOUNTER — Encounter: Payer: Self-pay | Admitting: Podiatry

## 2022-07-28 ENCOUNTER — Other Ambulatory Visit: Payer: Self-pay

## 2022-07-28 DIAGNOSIS — M79674 Pain in right toe(s): Secondary | ICD-10-CM

## 2022-07-28 DIAGNOSIS — B351 Tinea unguium: Secondary | ICD-10-CM | POA: Diagnosis not present

## 2022-07-28 DIAGNOSIS — N1832 Chronic kidney disease, stage 3b: Secondary | ICD-10-CM | POA: Diagnosis not present

## 2022-07-28 DIAGNOSIS — M79675 Pain in left toe(s): Secondary | ICD-10-CM | POA: Diagnosis not present

## 2022-07-28 DIAGNOSIS — Z789 Other specified health status: Secondary | ICD-10-CM

## 2022-07-28 NOTE — Progress Notes (Signed)
This patient returns to my office for at risk foot care.  This patient requires this care by a professional since this patient will be at risk due to having kidney disease.  This patient is unable to cut nails himself since the patient cannot reach his nails.These nails are painful walking and wearing shoes.  This patient presents for at risk foot care today.  General Appearance  Alert, conversant and in no acute stress.  Vascular  Dorsalis pedis and posterior tibial  pulses are  not palpable  due to swelling. bilaterally.  Capillary return is within normal limits  bilaterally. Temperature is within normal limits  bilaterally.  Neurologic  Senn-Weinstein monofilament wire test within normal limits  bilaterally. Muscle power within normal limits bilaterally.  Nails Thick disfigured discolored nails with subungual debris  from hallux to fifth toes bilaterally. No evidence of bacterial infection or drainage bilaterally.  Orthopedic  No limitations of motion  feet .  No crepitus or effusions noted.  No bony pathology or digital deformities noted.  Skin  normotropic skin with no porokeratosis noted bilaterally.  No signs of infections or ulcers noted.     Onychomycosis  Pain in right toes  Pain in left toes  Consent was obtained for treatment procedures.   Mechanical debridement of nails 1-5  bilaterally performed with a nail nipper.  Filed with dremel without incident.    Return office visit    3 months                  Told patient to return for periodic foot care and evaluation due to potential at risk complications.   Helane Gunther DPM

## 2022-07-29 ENCOUNTER — Emergency Department (HOSPITAL_COMMUNITY): Payer: Medicare Other

## 2022-07-29 ENCOUNTER — Ambulatory Visit: Admitting: Family Medicine

## 2022-07-29 ENCOUNTER — Inpatient Hospital Stay (HOSPITAL_COMMUNITY)
Admission: EM | Admit: 2022-07-29 | Discharge: 2022-08-02 | DRG: 698 | Disposition: A | Discharge status: Skilled Nursing Facility | Payer: Medicare Other | Attending: Family Medicine | Admitting: Family Medicine

## 2022-07-29 ENCOUNTER — Encounter (HOSPITAL_COMMUNITY): Payer: Self-pay

## 2022-07-29 ENCOUNTER — Other Ambulatory Visit: Payer: Self-pay

## 2022-07-29 DIAGNOSIS — Y738 Miscellaneous gastroenterology and urology devices associated with adverse incidents, not elsewhere classified: Secondary | ICD-10-CM | POA: Diagnosis present

## 2022-07-29 DIAGNOSIS — Z87891 Personal history of nicotine dependence: Secondary | ICD-10-CM | POA: Diagnosis not present

## 2022-07-29 DIAGNOSIS — Z6836 Body mass index (BMI) 36.0-36.9, adult: Secondary | ICD-10-CM

## 2022-07-29 DIAGNOSIS — G9341 Metabolic encephalopathy: Secondary | ICD-10-CM | POA: Diagnosis present

## 2022-07-29 DIAGNOSIS — E1159 Type 2 diabetes mellitus with other circulatory complications: Secondary | ICD-10-CM | POA: Diagnosis not present

## 2022-07-29 DIAGNOSIS — E876 Hypokalemia: Secondary | ICD-10-CM | POA: Diagnosis present

## 2022-07-29 DIAGNOSIS — Z9841 Cataract extraction status, right eye: Secondary | ICD-10-CM

## 2022-07-29 DIAGNOSIS — R531 Weakness: Secondary | ICD-10-CM

## 2022-07-29 DIAGNOSIS — Z7982 Long term (current) use of aspirin: Secondary | ICD-10-CM | POA: Diagnosis not present

## 2022-07-29 DIAGNOSIS — I6782 Cerebral ischemia: Secondary | ICD-10-CM | POA: Diagnosis not present

## 2022-07-29 DIAGNOSIS — B961 Klebsiella pneumoniae [K. pneumoniae] as the cause of diseases classified elsewhere: Secondary | ICD-10-CM | POA: Diagnosis present

## 2022-07-29 DIAGNOSIS — D631 Anemia in chronic kidney disease: Secondary | ICD-10-CM | POA: Diagnosis present

## 2022-07-29 DIAGNOSIS — E1122 Type 2 diabetes mellitus with diabetic chronic kidney disease: Secondary | ICD-10-CM | POA: Diagnosis present

## 2022-07-29 DIAGNOSIS — Z9842 Cataract extraction status, left eye: Secondary | ICD-10-CM | POA: Diagnosis not present

## 2022-07-29 DIAGNOSIS — I351 Nonrheumatic aortic (valve) insufficiency: Secondary | ICD-10-CM | POA: Diagnosis present

## 2022-07-29 DIAGNOSIS — Z8249 Family history of ischemic heart disease and other diseases of the circulatory system: Secondary | ICD-10-CM

## 2022-07-29 DIAGNOSIS — J929 Pleural plaque without asbestos: Secondary | ICD-10-CM | POA: Diagnosis not present

## 2022-07-29 DIAGNOSIS — I2510 Atherosclerotic heart disease of native coronary artery without angina pectoris: Secondary | ICD-10-CM | POA: Diagnosis present

## 2022-07-29 DIAGNOSIS — R600 Localized edema: Secondary | ICD-10-CM | POA: Diagnosis present

## 2022-07-29 DIAGNOSIS — I129 Hypertensive chronic kidney disease with stage 1 through stage 4 chronic kidney disease, or unspecified chronic kidney disease: Secondary | ICD-10-CM | POA: Diagnosis present

## 2022-07-29 DIAGNOSIS — E785 Hyperlipidemia, unspecified: Secondary | ICD-10-CM | POA: Diagnosis present

## 2022-07-29 DIAGNOSIS — R609 Edema, unspecified: Secondary | ICD-10-CM | POA: Diagnosis not present

## 2022-07-29 DIAGNOSIS — Z833 Family history of diabetes mellitus: Secondary | ICD-10-CM | POA: Diagnosis not present

## 2022-07-29 DIAGNOSIS — L853 Xerosis cutis: Secondary | ICD-10-CM | POA: Diagnosis present

## 2022-07-29 DIAGNOSIS — N399 Disorder of urinary system, unspecified: Secondary | ICD-10-CM | POA: Diagnosis not present

## 2022-07-29 DIAGNOSIS — R6889 Other general symptoms and signs: Secondary | ICD-10-CM | POA: Diagnosis not present

## 2022-07-29 DIAGNOSIS — E669 Obesity, unspecified: Secondary | ICD-10-CM | POA: Diagnosis present

## 2022-07-29 DIAGNOSIS — D472 Monoclonal gammopathy: Secondary | ICD-10-CM | POA: Diagnosis present

## 2022-07-29 DIAGNOSIS — Z79899 Other long term (current) drug therapy: Secondary | ICD-10-CM | POA: Diagnosis not present

## 2022-07-29 DIAGNOSIS — E8721 Acute metabolic acidosis: Secondary | ICD-10-CM | POA: Diagnosis present

## 2022-07-29 DIAGNOSIS — Z743 Need for continuous supervision: Secondary | ICD-10-CM | POA: Diagnosis not present

## 2022-07-29 DIAGNOSIS — E1129 Type 2 diabetes mellitus with other diabetic kidney complication: Secondary | ICD-10-CM | POA: Diagnosis not present

## 2022-07-29 DIAGNOSIS — Z1152 Encounter for screening for COVID-19: Secondary | ICD-10-CM | POA: Diagnosis not present

## 2022-07-29 DIAGNOSIS — N390 Urinary tract infection, site not specified: Secondary | ICD-10-CM | POA: Diagnosis present

## 2022-07-29 DIAGNOSIS — M6281 Muscle weakness (generalized): Secondary | ICD-10-CM | POA: Diagnosis not present

## 2022-07-29 DIAGNOSIS — A419 Sepsis, unspecified organism: Secondary | ICD-10-CM | POA: Diagnosis not present

## 2022-07-29 DIAGNOSIS — N1832 Chronic kidney disease, stage 3b: Secondary | ICD-10-CM | POA: Diagnosis present

## 2022-07-29 DIAGNOSIS — I878 Other specified disorders of veins: Secondary | ICD-10-CM | POA: Diagnosis present

## 2022-07-29 DIAGNOSIS — I10 Essential (primary) hypertension: Secondary | ICD-10-CM | POA: Diagnosis not present

## 2022-07-29 DIAGNOSIS — T83511A Infection and inflammatory reaction due to indwelling urethral catheter, initial encounter: Secondary | ICD-10-CM | POA: Diagnosis present

## 2022-07-29 DIAGNOSIS — I252 Old myocardial infarction: Secondary | ICD-10-CM | POA: Diagnosis not present

## 2022-07-29 DIAGNOSIS — Z959 Presence of cardiac and vascular implant and graft, unspecified: Secondary | ICD-10-CM | POA: Diagnosis not present

## 2022-07-29 DIAGNOSIS — I272 Pulmonary hypertension, unspecified: Secondary | ICD-10-CM | POA: Diagnosis present

## 2022-07-29 DIAGNOSIS — Z978 Presence of other specified devices: Secondary | ICD-10-CM | POA: Diagnosis not present

## 2022-07-29 DIAGNOSIS — R262 Difficulty in walking, not elsewhere classified: Secondary | ICD-10-CM | POA: Diagnosis not present

## 2022-07-29 DIAGNOSIS — I700 Atherosclerosis of aorta: Secondary | ICD-10-CM | POA: Diagnosis not present

## 2022-07-29 DIAGNOSIS — I152 Hypertension secondary to endocrine disorders: Secondary | ICD-10-CM | POA: Diagnosis not present

## 2022-07-29 DIAGNOSIS — R488 Other symbolic dysfunctions: Secondary | ICD-10-CM | POA: Diagnosis not present

## 2022-07-29 LAB — RESP PANEL BY RT-PCR (RSV, FLU A&B, COVID)  RVPGX2
Influenza A by PCR: NEGATIVE
Influenza B by PCR: NEGATIVE
Resp Syncytial Virus by PCR: NEGATIVE
SARS Coronavirus 2 by RT PCR: NEGATIVE

## 2022-07-29 LAB — COMPREHENSIVE METABOLIC PANEL
ALT: 16 U/L (ref 0–44)
AST: 23 U/L (ref 15–41)
Albumin: 3.2 g/dL — ABNORMAL LOW (ref 3.5–5.0)
Alkaline Phosphatase: 50 U/L (ref 38–126)
Anion gap: 8 (ref 5–15)
BUN: 19 mg/dL (ref 8–23)
CO2: 20 mmol/L — ABNORMAL LOW (ref 22–32)
Calcium: 8.4 mg/dL — ABNORMAL LOW (ref 8.9–10.3)
Chloride: 115 mmol/L — ABNORMAL HIGH (ref 98–111)
Creatinine, Ser: 1.42 mg/dL — ABNORMAL HIGH (ref 0.61–1.24)
GFR, Estimated: 47 mL/min — ABNORMAL LOW (ref >60)
Glucose, Bld: 106 mg/dL — ABNORMAL HIGH (ref 70–99)
Potassium: 3.2 mmol/L — ABNORMAL LOW (ref 3.5–5.1)
Sodium: 143 mmol/L (ref 135–145)
Total Bilirubin: 2 mg/dL — ABNORMAL HIGH (ref 0.3–1.2)
Total Protein: 7.1 g/dL (ref 6.5–8.1)

## 2022-07-29 LAB — CBC WITH DIFFERENTIAL/PLATELET
Abs Immature Granulocytes: 0 10*3/uL (ref 0.00–0.07)
Band Neutrophils: 1 %
Basophils Absolute: 0.1 10*3/uL (ref 0.0–0.1)
Basophils Relative: 1 %
Eosinophils Absolute: 0.2 10*3/uL (ref 0.0–0.5)
Eosinophils Relative: 4 %
HCT: 26.4 % — ABNORMAL LOW (ref 39.0–52.0)
Hemoglobin: 8.5 g/dL — ABNORMAL LOW (ref 13.0–17.0)
Lymphocytes Relative: 18 %
Lymphs Abs: 0.9 10*3/uL (ref 0.7–4.0)
MCH: 33.5 pg (ref 26.0–34.0)
MCHC: 32.2 g/dL (ref 30.0–36.0)
MCV: 103.9 fL — ABNORMAL HIGH (ref 80.0–100.0)
Monocytes Absolute: 0.3 10*3/uL (ref 0.1–1.0)
Monocytes Relative: 6 %
Neutro Abs: 3.6 10*3/uL (ref 1.7–7.7)
Neutrophils Relative %: 70 %
Platelets: 142 10*3/uL — ABNORMAL LOW (ref 150–400)
RBC: 2.54 MIL/uL — ABNORMAL LOW (ref 4.22–5.81)
RDW: 27.9 % — ABNORMAL HIGH (ref 11.5–15.5)
WBC: 5.1 10*3/uL (ref 4.0–10.5)
nRBC: 3.3 % — ABNORMAL HIGH (ref 0.0–0.2)
nRBC: 9 /100 WBC — ABNORMAL HIGH

## 2022-07-29 LAB — PROTIME-INR
INR: 1.2 (ref 0.8–1.2)
Prothrombin Time: 15.9 seconds — ABNORMAL HIGH (ref 11.4–15.2)

## 2022-07-29 LAB — URINALYSIS, W/ REFLEX TO CULTURE (INFECTION SUSPECTED)
Bilirubin Urine: NEGATIVE
Glucose, UA: NEGATIVE mg/dL
Ketones, ur: NEGATIVE mg/dL
Nitrite: NEGATIVE
Protein, ur: 100 mg/dL — AB
RBC / HPF: >50 RBC/hpf (ref 0–5)
Specific Gravity, Urine: 1.017 (ref 1.005–1.030)
WBC, UA: >50 WBC/hpf (ref 0–5)
pH: 5 (ref 5.0–8.0)

## 2022-07-29 LAB — MAGNESIUM: Magnesium: 2.2 mg/dL (ref 1.7–2.4)

## 2022-07-29 LAB — CULTURE, BLOOD (ROUTINE X 2)

## 2022-07-29 LAB — AMMONIA: Ammonia: 32 umol/L (ref 9–35)

## 2022-07-29 LAB — LACTIC ACID, PLASMA
Lactic Acid, Venous: 1.3 mmol/L (ref 0.5–1.9)
Lactic Acid, Venous: 1.6 mmol/L (ref 0.5–1.9)

## 2022-07-29 LAB — TROPONIN I (HIGH SENSITIVITY)
Troponin I (High Sensitivity): 17 ng/L (ref <18)
Troponin I (High Sensitivity): 17 ng/L (ref <18)

## 2022-07-29 LAB — APTT: aPTT: 35 seconds (ref 24–36)

## 2022-07-29 MED ORDER — CARVEDILOL 3.125 MG PO TABS: 6.2500 mg | ORAL_TABLET | Freq: Two times a day (BID) | ORAL | Status: DC

## 2022-07-29 MED ORDER — ENOXAPARIN SODIUM 40 MG/0.4ML IJ SOSY
40.0000 mg | PREFILLED_SYRINGE | INTRAMUSCULAR | Status: DC
  Administered 2022-07-29 – 2022-07-30 (×2): 40 mg via SUBCUTANEOUS
  Filled 2022-07-29 (×2): qty 0.4

## 2022-07-29 MED ORDER — ROSUVASTATIN CALCIUM 10 MG PO TABS
5.0000 mg | ORAL_TABLET | Freq: Every day | ORAL | Status: DC
  Administered 2022-07-29 – 2022-08-02 (×5): 5 mg via ORAL
  Filled 2022-07-29 (×5): qty 1

## 2022-07-29 MED ORDER — POLYETHYLENE GLYCOL 3350 17 G PO PACK: 17.0000 g | PACK | Freq: Every day | ORAL | Status: DC | PRN

## 2022-07-29 MED ORDER — POTASSIUM CHLORIDE CRYS ER 20 MEQ PO TBCR: 20.0000 meq | EXTENDED_RELEASE_TABLET | Freq: Once | ORAL | Status: DC

## 2022-07-29 MED ORDER — FUROSEMIDE 10 MG/ML IJ SOLN
40.0000 mg | Freq: Once | INTRAMUSCULAR | Status: AC
  Administered 2022-07-29: 40 mg via INTRAVENOUS
  Filled 2022-07-29: qty 4

## 2022-07-29 MED ORDER — POTASSIUM CHLORIDE 20 MEQ PO PACK
40.0000 meq | PACK | ORAL | Status: DC
Start: 2022-07-29 — End: 2022-07-29

## 2022-07-29 MED ORDER — ONDANSETRON HCL 4 MG/2ML IJ SOLN: 4.0000 mg | Freq: Four times a day (QID) | INTRAMUSCULAR | Status: DC | PRN

## 2022-07-29 MED ORDER — AMLODIPINE BESYLATE 5 MG PO TABS
5.0000 mg | ORAL_TABLET | Freq: Every day | ORAL | Status: DC
  Administered 2022-07-29 – 2022-07-30 (×2): 5 mg via ORAL
  Filled 2022-07-29 (×2): qty 1

## 2022-07-29 MED ORDER — HYDRALAZINE HCL 20 MG/ML IJ SOLN
5.0000 mg | Freq: Once | INTRAMUSCULAR | Status: AC
  Administered 2022-07-29: 5 mg via INTRAVENOUS
  Filled 2022-07-29: qty 1

## 2022-07-29 MED ORDER — ACETAMINOPHEN 325 MG PO TABS: 650.0000 mg | ORAL_TABLET | Freq: Four times a day (QID) | ORAL | Status: DC | PRN

## 2022-07-29 MED ORDER — SODIUM CHLORIDE 0.9 % IV SOLN
2.0000 g | INTRAVENOUS | Status: DC
  Administered 2022-07-29 – 2022-08-01 (×4): 2 g via INTRAVENOUS
  Filled 2022-07-29 (×4): qty 20

## 2022-07-29 MED ORDER — CARVEDILOL 3.125 MG PO TABS
6.2500 mg | ORAL_TABLET | Freq: Two times a day (BID) | ORAL | Status: DC
  Administered 2022-07-29 – 2022-08-02 (×8): 6.25 mg via ORAL
  Filled 2022-07-29 (×8): qty 2

## 2022-07-29 MED ORDER — LIDOCAINE HCL URETHRAL/MUCOSAL 2 % EX GEL
1.0000 | Freq: Once | CUTANEOUS | Status: AC
  Administered 2022-07-29: 1 via URETHRAL
  Filled 2022-07-29: qty 10

## 2022-07-29 MED ORDER — ASPIRIN 81 MG PO TBEC: 81.0000 mg | DELAYED_RELEASE_TABLET | Freq: Every day | ORAL | Status: DC

## 2022-07-29 MED ORDER — CIPROFLOXACIN HCL 250 MG PO TABS
500.0000 mg | ORAL_TABLET | Freq: Once | ORAL | Status: AC
  Administered 2022-07-29: 500 mg via ORAL
  Filled 2022-07-29: qty 2

## 2022-07-29 MED ORDER — POTASSIUM CHLORIDE 20 MEQ PO PACK
40.0000 meq | PACK | Freq: Once | ORAL | Status: AC
  Administered 2022-07-29: 40 meq via ORAL
  Filled 2022-07-29: qty 2

## 2022-07-29 MED ORDER — POTASSIUM CHLORIDE CRYS ER 20 MEQ PO TBCR
20.0000 meq | EXTENDED_RELEASE_TABLET | Freq: Once | ORAL | Status: AC
  Administered 2022-07-29: 20 meq via ORAL
  Filled 2022-07-29: qty 1

## 2022-07-29 MED ORDER — ACETAMINOPHEN 650 MG RE SUPP: 650.0000 mg | Freq: Four times a day (QID) | RECTAL | Status: DC | PRN

## 2022-07-29 MED ORDER — HYDRALAZINE HCL 20 MG/ML IJ SOLN
5.0000 mg | INTRAMUSCULAR | Status: DC | PRN
  Administered 2022-07-29: 5 mg via INTRAVENOUS
  Filled 2022-07-29: qty 1

## 2022-07-29 MED ORDER — ONDANSETRON HCL 4 MG PO TABS: 4.0000 mg | ORAL_TABLET | Freq: Four times a day (QID) | ORAL | Status: DC | PRN

## 2022-07-29 NOTE — ED Notes (Signed)
Pt's daughter requested to be updated when pt was moved to the floor. This RN tried to call daughter, [name], with no answer at this time and no voicemail set up.

## 2022-07-29 NOTE — Assessment & Plan Note (Addendum)
2 + Pitting bilateral lower extremity edema-per patient chronic. With chronic stasis changes.  Per chart weight appears stable.  Chest x-ray -pulm inflation with bronchovascular crowding, question tiny effusions.  History of pulmonary hypertension.  Echo 09/2021 shows severely elevated pulmonary artery systolic pressure. RV systolic pressure of 62. -IV Lasix 40 mg x 1

## 2022-07-29 NOTE — ED Notes (Signed)
Pt's daughter called this RN back and update was given to [facility]

## 2022-07-29 NOTE — Assessment & Plan Note (Signed)
Blood pressure elevated up to 200/111. -Norvasc 5 mg, carvedilol 6.25 mg twice daily resumed -IV Hydralazine as needed 5 mg, systolic greater than 180
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
## 2022-07-29 NOTE — Assessment & Plan Note (Signed)
Denies chest pain.  EKG- unchanged.  Troponin 17 x 2.  Follows with cardiology.

## 2022-07-29 NOTE — Assessment & Plan Note (Addendum)
Reported confusion, weakness unable to ambulate.  Head CT negative for acute abnormality, asymmetric soft tissue thickening around the right EAC.  Chronic indwelling Foley catheter.  Possible UTI with UA showing few bacteria greater than 50 WBCs.  Rules out for sepsis.  Lactic acid 1.3 > 1.6.  Chest x-ray poor inflation with bronchovascular crowding question tiny effusion.  Per outpatient notes 7/19-patient requiring more care than daughter is able to handle, seeking placement for patient. -Last urine culture on file 01/2022 grew MRSA, treated with vancomycin >> linezolid -IV ceftriaxone 2 g daily -Add on urine cultures -Follow-up chest x-ray as outpatient -PT, TOC consult -Consider changing Foley prior to discharge

## 2022-07-29 NOTE — Assessment & Plan Note (Signed)
Cr 1.4, close to baseline 1-1.2. -Lasix 40 mg x 1

## 2022-07-30 DIAGNOSIS — G9341 Metabolic encephalopathy: Secondary | ICD-10-CM | POA: Diagnosis not present

## 2022-07-30 LAB — BASIC METABOLIC PANEL
Anion gap: 8 (ref 5–15)
BUN: 19 mg/dL (ref 8–23)
CO2: 22 mmol/L (ref 22–32)
Calcium: 8.6 mg/dL — ABNORMAL LOW (ref 8.9–10.3)
Chloride: 115 mmol/L — ABNORMAL HIGH (ref 98–111)
Creatinine, Ser: 1.48 mg/dL — ABNORMAL HIGH (ref 0.61–1.24)
GFR, Estimated: 44 mL/min — ABNORMAL LOW (ref >60)
Glucose, Bld: 102 mg/dL — ABNORMAL HIGH (ref 70–99)
Potassium: 3.3 mmol/L — ABNORMAL LOW (ref 3.5–5.1)
Sodium: 145 mmol/L (ref 135–145)

## 2022-07-30 LAB — CBC
HCT: 27.2 % — ABNORMAL LOW (ref 39.0–52.0)
Hemoglobin: 9 g/dL — ABNORMAL LOW (ref 13.0–17.0)
MCH: 34.4 pg — ABNORMAL HIGH (ref 26.0–34.0)
MCHC: 33.1 g/dL (ref 30.0–36.0)
MCV: 103.8 fL — ABNORMAL HIGH (ref 80.0–100.0)
Platelets: 143 10*3/uL — ABNORMAL LOW (ref 150–400)
RBC: 2.62 MIL/uL — ABNORMAL LOW (ref 4.22–5.81)
RDW: 27.7 % — ABNORMAL HIGH (ref 11.5–15.5)
WBC: 6.2 10*3/uL (ref 4.0–10.5)
nRBC: 2.4 % — ABNORMAL HIGH (ref 0.0–0.2)

## 2022-07-30 MED ORDER — AMLODIPINE BESYLATE 5 MG PO TABS
10.0000 mg | ORAL_TABLET | Freq: Every day | ORAL | Status: DC
  Administered 2022-07-31 – 2022-08-02 (×3): 10 mg via ORAL
  Filled 2022-07-30 (×3): qty 2

## 2022-07-30 MED ORDER — AMLODIPINE BESYLATE 5 MG PO TABS
5.0000 mg | ORAL_TABLET | Freq: Once | ORAL | Status: AC
  Administered 2022-07-30: 5 mg via ORAL
  Filled 2022-07-30: qty 1

## 2022-07-30 MED ORDER — CHLORHEXIDINE GLUCONATE CLOTH 2 % EX PADS
6.0000 | MEDICATED_PAD | Freq: Every day | CUTANEOUS | Status: DC
  Administered 2022-07-30 – 2022-08-02 (×4): 6 via TOPICAL

## 2022-07-30 MED ORDER — HYDRALAZINE HCL 20 MG/ML IJ SOLN: 10.0000 mg | INTRAMUSCULAR | Status: DC | PRN

## 2022-07-30 MED ORDER — POTASSIUM CHLORIDE CRYS ER 20 MEQ PO TBCR
40.0000 meq | EXTENDED_RELEASE_TABLET | ORAL | Status: AC
  Administered 2022-07-30 (×2): 40 meq via ORAL
  Filled 2022-07-30 (×2): qty 2

## 2022-07-30 NOTE — TOC Progression Note (Signed)
Transition of Care [facility]) - Progression Note    Patient Details  Name: Terrance Miller MRN: [id number] Date of Birth: [birth year]  Transition of Care [facility]) CM/SW Contact  Karn Cassis, [place] Phone Number: 07/30/2022, 1:46 PM  Clinical Narrative: Bed offers provided to pt's daughter who accepts [facility]. Facility notified.       Expected Discharge Plan: Skilled Nursing Facility Barriers to Discharge: Continued Medical Work up  Expected Discharge Plan and Services In-house Referral: Clinical Social Work   Post Acute Care Choice: Skilled Nursing Facility Living arrangements for the past 2 months: Single Family Home                                       Social Determinants of Health (SDOH) Interventions SDOH Screenings   Food Insecurity: No Food Insecurity (07/30/2022)  Housing: Low Risk  (07/30/2022)  Transportation Needs: No Transportation Needs (07/30/2022)  Utilities: Not At Risk (07/30/2022)  Alcohol Screen: Low Risk  (07/25/2022)  Depression (PHQ2-9): Low Risk  (07/25/2022)  Financial Resource Strain: Low Risk  (07/25/2022)  Physical Activity: Insufficiently Active (07/25/2022)  Social Connections: Socially Isolated (07/25/2022)  Stress: No Stress Concern Present (07/25/2022)  Tobacco Use: Medium Risk (07/29/2022)  Health Literacy: Adequate Health Literacy (07/25/2022)    Readmission Risk Interventions     No data to display

## 2022-07-30 NOTE — Evaluation (Signed)
Physical Therapy Evaluation Patient Details Name: Terrance Miller MRN: [id number] DOB: May 19, 1928 Today's Date: 07/30/2022  History of Present Illness  Terrance Miller is a 89 y.o. male with medical history significant for MGUS, diabetes mellitus, coronary artery disease, hypertension, aortic regurgitation.  Patient presented to the ED with complaints of generalized weakness over the past 5 days, reports that he cannot ambulate, and dark urine.  He has a chronic indwelling Foley catheter.  At the time of my evaluation, patient is awake alert able to answer simple questions, speech sometimes hard to understand.  He denies chest pain, no difficulty breathing.  He has chronic bilateral lower extremity swelling, he reports pain to his right ankle.   Clinical Impression  Patient has difficulty scooting to EOB due to scrotal discomfort and generalized weakness, required repeated attempts with Mod assist before able to stand, once standing demonstrated fair/good return for ambulating using RW without loss of balance, but limited mostly due to fatigue.  Patient tolerated sitting up in chair after therapy. Patient will benefit from continued skilled physical therapy in hospital and recommended venue below to increase strength, balance, endurance for safe ADLs and gait.          Assistance Recommended at Discharge Set up Supervision/Assistance  If plan is discharge home, recommend the following:  Can travel by private vehicle  A lot of help with walking and/or transfers;A lot of help with bathing/dressing/bathroom;Help with stairs or ramp for entrance;Assistance with cooking/housework   Yes    Equipment Recommendations None recommended by PT  Recommendations for Other Services       Functional Status Assessment Patient has had a recent decline in their functional status and demonstrates the ability to make significant improvements in function in a reasonable and predictable amount of time.      Precautions / Restrictions Precautions Precautions: Fall Restrictions Weight Bearing Restrictions: No      Mobility  Bed Mobility Overal bed mobility: Needs Assistance Bed Mobility: Supine to Sit     Supine to sit: Mod assist     General bed mobility comments: increased time, labored movment with most diffiuclty scooting to EOB due to swollen scrotal area    Transfers Overall transfer level: Needs assistance Equipment used: Rolling walker (2 wheels) Transfers: Sit to/from Stand, Bed to chair/wheelchair/BSC Sit to Stand: Mod assist   Step pivot transfers: Mod assist, Min assist       General transfer comment: had difficulty completing sit to stands due to BLE weakness    Ambulation/Gait Ambulation/Gait assistance: Min assist, Mod assist Gait Distance (Feet): 40 Feet Assistive device: Rolling walker (2 wheels) Gait Pattern/deviations: Decreased step length - right, Decreased step length - left, Decreased stride length Gait velocity: decreased     General Gait Details: slow labored cadence without loss of balance and limited mostly due to c/o fatigue  Stairs            Wheelchair Mobility     Tilt Bed    Modified Rankin (Stroke Patients Only)       Balance Overall balance assessment: Needs assistance Sitting-balance support: Feet supported, No upper extremity supported Sitting balance-Leahy Scale: Fair Sitting balance - Comments: seated at EOB   Standing balance support: Reliant on assistive device for balance, During functional activity, Bilateral upper extremity supported Standing balance-Leahy Scale: Fair Standing balance comment: using RW  Pertinent Vitals/Pain Pain Assessment Pain Assessment: No/denies pain    Home Living Family/patient expects to be discharged to:: Private residence Living Arrangements: Children Available Help at Discharge: Family;Available 24 hours/day Type of Home: House Home  Access: Stairs to enter Entrance Stairs-Rails: None Entrance Stairs-Number of Steps: 3 Alternate Level Stairs-Number of Steps: 4 Home Layout: Able to live on main level with bedroom/bathroom;Two level Home Equipment: Toilet riser;Cane - single point;Rolling Walker (2 wheels);Rollator (4 wheels)      Prior Function Prior Level of Function : Needs assist  Cognitive Assist : Mobility (cognitive)     Physical Assist : Mobility (physical);ADLs (physical) Mobility (physical): Transfers;Gait;Bed mobility;Stairs ADLs (physical): IADLs;Dressing;Bathing Mobility Comments: household ambulator using RW ADLs Comments: Assisted with bathing and dressing per pt's reports. Chart review indicates assist for IADL's as well.     Hand Dominance   Dominant Hand: Left    Extremity/Trunk Assessment   Upper Extremity Assessment Upper Extremity Assessment: Generalized weakness    Lower Extremity Assessment Lower Extremity Assessment: Generalized weakness    Cervical / Trunk Assessment Cervical / Trunk Assessment: Normal  Communication   Communication: HOH  Cognition Arousal/Alertness: Awake/alert Behavior During Therapy: WFL for tasks assessed/performed Overall Cognitive Status: Within Functional Limits for tasks assessed                                          General Comments      Exercises     Assessment/Plan    PT Assessment Patient needs continued PT services  PT Problem List Decreased strength;Decreased activity tolerance;Decreased balance;Decreased mobility       PT Treatment Interventions DME instruction;Gait training;Stair training;Functional mobility training;Therapeutic activities;Therapeutic exercise;Balance training;Patient/family education    PT Goals (Current goals can be found in the Care Plan section)  Acute Rehab PT Goals Patient Stated Goal: return home with family to assist PT Goal Formulation: With patient Time For Goal Achievement:  08/13/22 Potential to Achieve Goals: Good    Frequency Min 3X/week     Co-evaluation               AM-PAC PT "6 Clicks" Mobility  Outcome Measure Help needed turning from your back to your side while in a flat bed without using bedrails?: A Little Help needed moving from lying on your back to sitting on the side of a flat bed without using bedrails?: A Lot Help needed moving to and from a bed to a chair (including a wheelchair)?: A Little Help needed standing up from a chair using your arms (e.g., wheelchair or bedside chair)?: A Lot Help needed to walk in hospital room?: A Little Help needed climbing 3-5 steps with a railing? : A Lot 6 Click Score: 15    End of Session   Activity Tolerance: Patient tolerated treatment well;Patient limited by fatigue Patient left: in chair;with call bell/phone within reach Nurse Communication: Mobility status PT Visit Diagnosis: Unsteadiness on feet (R26.81);Other abnormalities of gait and mobility (R26.89);Muscle weakness (generalized) (M62.81)    Time: 1610-9604 PT Time Calculation (min) (ACUTE ONLY): 27 min   Charges:   PT Evaluation $PT Eval Moderate Complexity: 1 Mod PT Treatments $Therapeutic Activity: 23-37 mins PT General Charges $$ ACUTE PT VISIT: 1 Visit         1:55 PM, 07/30/22 Ocie Bob, MPT Physical Therapist with [facility] 336 [phone] office [phone] mobile phone

## 2022-07-30 NOTE — Plan of Care (Signed)
  Problem: Acute Rehab PT Goals(only PT should resolve) Goal: Pt Will Go Supine/Side To Sit Outcome: Progressing Flowsheets (Taken 07/30/2022 1357) Pt will go Supine/Side to Sit:  with minimal assist  with moderate assist Goal: Patient Will Transfer Sit To/From Stand Outcome: Progressing Flowsheets (Taken 07/30/2022 1357) Patient will transfer sit to/from stand:  with minimal assist  with moderate assist Goal: Pt Will Transfer Bed To Chair/Chair To Bed Outcome: Progressing Flowsheets (Taken 07/30/2022 1357) Pt will Transfer Bed to Chair/Chair to Bed:  with mod assist  with min assist Goal: Pt Will Ambulate Outcome: Progressing Flowsheets (Taken 07/30/2022 1357) Pt will Ambulate:  75 feet  with minimal assist  with min guard assist  with rolling walker   1:58 PM, 07/30/22 Ocie Bob, MPT Physical Therapist with [facility] 336 [phone] office [phone] mobile phone

## 2022-07-30 NOTE — TOC Initial Note (Signed)
Transition of Care [facility]) - Initial/Assessment Note    Patient Details  Name: Terrance Miller MRN: [id number] Date of Birth: 1928-07-13  Transition of Care [facility]) CM/SW Contact:    Karn Cassis, LCSW Phone Number: 07/30/2022, 11:17 AM  Clinical Narrative: Pt admitted due to acute metabolic encephalopathy. Pt defers assessment to daughter. Pt's daughter reports pt lives with her. He requires assist with ADLs. PT evaluated pt and recommend SNF. Discussed placement process and Medicare.gov ratings. Requesting [place] SNF. Will initiate bed search and SNF auth.                  Expected Discharge Plan: Skilled Nursing Facility Barriers to Discharge: Continued Medical Work up   Patient Goals and CMS Choice Patient states their goals for this hospitalization and ongoing recovery are:: SNF   Choice offered to / list presented to : Adult Children [place] ownership interest in [facility].provided to:: Adult Children    Expected Discharge Plan and Services In-house Referral: Clinical Social Work   Post Acute Care Choice: Skilled Nursing Facility Living arrangements for the past 2 months: Single Family Home                                      Prior Living Arrangements/Services Living arrangements for the past 2 months: Single Family Home Lives with:: Adult Children Patient language and need for interpreter reviewed:: Yes Do you feel safe going back to the place where you live?: Yes      Need for Family Participation in Patient Care: Yes (Comment) Care giver support system in place?: Yes (comment) Current home services: DME (cane, walker) Criminal Activity/Legal Involvement Pertinent to Current Situation/Hospitalization: No - Comment as needed  Activities of Daily Living Home Assistive Devices/Equipment: Cane (specify quad or straight), Walker (specify type), Wheelchair ADL Screening (condition at time of admission) Patient's cognitive ability  adequate to safely complete daily activities?: No Is the patient deaf or have difficulty hearing?: Yes Does the patient have difficulty seeing, even when wearing glasses/contacts?: No Does the patient have difficulty concentrating, remembering, or making decisions?: Yes Patient able to express need for assistance with ADLs?: Yes Does the patient have difficulty dressing or bathing?: Yes Independently performs ADLs?: No Communication: Independent Dressing (OT): Dependent Is this a change from baseline?: Change from baseline, expected to last <3days Grooming: Dependent Is this a change from baseline?: Change from baseline, expected to last <3 days Feeding: Needs assistance Is this a change from baseline?: Change from baseline, expected to last <3 days Bathing: Dependent Is this a change from baseline?: Change from baseline, expected to last <3 days Toileting: Dependent Is this a change from baseline?: Change from baseline, expected to last <3 days In/Out Bed: Dependent Is this a change from baseline?: Change from baseline, expected to last <3 days Walks in Home: Independent with device (comment) Does the patient have difficulty walking or climbing stairs?: Yes Weakness of Legs: Both Weakness of Arms/Hands: Both  Permission Sought/Granted                  Emotional Assessment         Alcohol / Substance Use: Not Applicable Psych Involvement: No (comment)  Admission diagnosis:  Weakness [R53.1] Acute metabolic encephalopathy [G93.41] Urinary tract infection associated with indwelling urethral catheter, initial encounter (HCC) [O27.035K, N39.0] Patient Active Problem List   Diagnosis Date Noted   Acute metabolic  encephalopathy 07/29/2022   Chronic indwelling Foley catheter 07/29/2022   Enlarged and hypertrophic nails 03/26/2022   Aortic regurgitation 03/17/2022   Pulmonary HTN (HCC) 03/17/2022   Dry skin dermatitis 02/05/2022   Acute urinary retention 12/01/2021    Stage 3b chronic kidney disease (HCC) 11/05/2021   MGUS (monoclonal gammopathy of unknown significance) 11/05/2021   Macrocytic anemia 05/31/2021   History of MI (myocardial infarction) 04/29/2021   Hypertension 09/28/2013   Hyperlipidemia LDL goal <70 09/28/2013   Lower extremity edema 09/28/2013   CAD (coronary artery disease) 09/28/2013   PCP:  [name], DO Pharmacy:   [facility] - [place], [place] - [address] [place] [address] Phone: [phone] Fax: [phone]  [facility] - [place], [place] - [address] [place] [address] Phone: [phone] Fax: [phone]     Social Determinants of Health (SDOH) Social History: SDOH Screenings   Food Insecurity: No Food Insecurity (07/30/2022)  Housing: Low Risk  (07/30/2022)  Transportation Needs: No Transportation Needs (07/30/2022)  Utilities: Not At Risk (07/30/2022)  Alcohol Screen: Low Risk  (07/25/2022)  Depression (PHQ2-9): Low Risk  (07/25/2022)  Financial Resource Strain: Low Risk  (07/25/2022)  Physical Activity: Insufficiently Active (07/25/2022)  Social Connections: Socially Isolated (07/25/2022)  Stress: No Stress Concern Present (07/25/2022)  Tobacco Use: Medium Risk (07/29/2022)  Health Literacy: Adequate Health Literacy (07/25/2022)   SDOH Interventions:     Readmission Risk Interventions     No data to display

## 2022-07-30 NOTE — NC FL2 (Signed)
[place] MEDICAID FL2 LEVEL OF CARE FORM     IDENTIFICATION  Patient Name: Terrance Miller Birthdate: 27-Sep-1928 Sex: male Admission Date (Current Location): 07/29/2022  [place] Number:  Terrance Miller [id number] Facility and Address:  [facility],  [address] [place] [address]      Provider Number: [id number]  Attending Physician Name and Address:  Shon Hale, MD  Relative Name and Phone Number:       Current Level of Care: Hospital Recommended Level of Care: Skilled Nursing Facility Prior Approval Number:    Date Approved/Denied:   PASRR Number: [id number]  Discharge Plan: SNF    Current Diagnoses: Patient Active Problem List   Diagnosis Date Noted   Acute metabolic encephalopathy 07/29/2022   Chronic indwelling Foley catheter 07/29/2022   Enlarged and hypertrophic nails 03/26/2022   Aortic regurgitation 03/17/2022   Pulmonary HTN (HCC) 03/17/2022   Dry skin dermatitis 02/05/2022   Acute urinary retention 12/01/2021   Stage 3b chronic kidney disease (HCC) 11/05/2021   MGUS (monoclonal gammopathy of unknown significance) 11/05/2021   Macrocytic anemia 05/31/2021   History of MI (myocardial infarction) 04/29/2021   Hypertension 09/28/2013   Hyperlipidemia LDL goal <70 09/28/2013   Lower extremity edema 09/28/2013   CAD (coronary artery disease) 09/28/2013    Orientation RESPIRATION BLADDER Height & Weight     Self, Situation, Place  Normal Indwelling catheter Weight: 236 lb 1.8 oz (107.1 kg) Height:  5\' 7"  (170.2 cm)  BEHAVIORAL SYMPTOMS/MOOD NEUROLOGICAL BOWEL NUTRITION STATUS      Incontinent Diet (Heart healthy/carb modified. See d/c summary for updates.)  AMBULATORY STATUS COMMUNICATION OF NEEDS Skin   Extensive Assist Verbally Other (Comment) (Redness to buttocks, scrotum, sacrum. Stage II to scrotum with foam dressing. Stage I to sacrum with foam dressing.)                       Personal Care Assistance Level of  Assistance  Bathing, Feeding, Dressing Bathing Assistance: Maximum assistance Feeding assistance: Limited assistance Dressing Assistance: Maximum assistance     Functional Limitations Info  Sight, Hearing, Speech Sight Info: Adequate Hearing Info: Impaired Speech Info: Adequate    SPECIAL CARE FACTORS FREQUENCY  PT (By licensed PT)     PT Frequency: 5x weekly              Contractures      Additional Factors Info  Code Status, Allergies Code Status Info: Full code Allergies Info: No known allergies           Current Medications (07/30/2022):  This is the current hospital active medication list Current Facility-Administered Medications  Medication Dose Route Frequency Provider Last Rate Last Admin   acetaminophen (TYLENOL) tablet 650 mg  650 mg Oral Q6H PRN Emokpae, Ejiroghene E, MD       Or   acetaminophen (TYLENOL) suppository 650 mg  650 mg Rectal Q6H PRN Emokpae, Ejiroghene E, MD       [START ON 07/31/2022] amLODipine (NORVASC) tablet 10 mg  10 mg Oral Daily Emokpae, Courage, MD       carvedilol (COREG) tablet 6.25 mg  6.25 mg Oral BID WC Emokpae, Ejiroghene E, MD   6.25 mg at 07/30/22 0905   cefTRIAXone (ROCEPHIN) 2 g in sodium chloride 0.9 % 100 mL IVPB  2 g Intravenous Q24H Emokpae, Ejiroghene E, MD   Stopped at 07/29/22 2246   Chlorhexidine Gluconate Cloth 2 % PADS 6 each  6 each Topical Daily Emokpae,  Courage, MD   6 each at 07/30/22 0939   enoxaparin (LOVENOX) injection 40 mg  40 mg Subcutaneous Q24H Emokpae, Ejiroghene E, MD   40 mg at 07/29/22 2324   hydrALAZINE (APRESOLINE) injection 10 mg  10 mg Intravenous Q4H PRN Emokpae, Courage, MD       ondansetron (ZOFRAN) tablet 4 mg  4 mg Oral Q6H PRN Emokpae, Ejiroghene E, MD       Or   ondansetron (ZOFRAN) injection 4 mg  4 mg Intravenous Q6H PRN Emokpae, Ejiroghene E, MD       polyethylene glycol (MIRALAX / GLYCOLAX) packet 17 g  17 g Oral Daily PRN Emokpae, Ejiroghene E, MD       potassium chloride SA (KLOR-CON  M) CR tablet 40 mEq  40 mEq Oral Q3H Emokpae, Courage, MD   40 mEq at 07/30/22 1023   rosuvastatin (CRESTOR) tablet 5 mg  5 mg Oral Daily Emokpae, Ejiroghene E, MD   5 mg at 07/30/22 1610     Discharge Medications: Please see discharge summary for a list of discharge medications.  Relevant Imaging Results:  Relevant Lab Results:   Additional Information SSN: [id number]  Karn Cassis, LCSW

## 2022-07-30 NOTE — Progress Notes (Signed)
PROGRESS NOTE     Terrance Miller, is a 89 y.o. male, DOB - 1928/07/20, ZOX:[id number]  Admit date - 07/29/2022   Admitting Physician Onnie Boer, MD  Outpatient Primary MD for the patient is Terrance Sams, DO  LOS - 1  Chief Complaint  Patient presents with   Weakness    Pt complains of generalized weakness       Brief Narrative:  89 y.o. male with medical history significant for MGUS, diabetes mellitus, coronary artery disease, hypertension, aortic regurgitation admitted on 07/29/2022 with acute metabolic encephalopathy with concerns for UTI    -Assessment and Plan: 1) Acute metabolic encephalopathy Reported confusion, weakness unable to ambulate.  Head CT negative for acute abnormality, asymmetric soft tissue thickening around the right EAC.  Chronic indwelling Foley catheter.  Possible UTI with UA showing few bacteria greater than 50 WBCs.  Rules out for sepsis.  Lactic acid 1.3 > 1.6.  Chest x-ray poor inflation with bronchovascular crowding question tiny effusion.  Per outpatient notes 7/19-patient requiring more care than daughter is able to handle, seeking placement for patient. -Last urine culture on file 01/2022 grew MRSA, treated with vancomycin >> linezolid -IV ceftriaxone 2 g daily Patient has chronic indwelling Foley--POA -Consider changing Foley prior to discharge  Lower extremity edema 2 + Pitting bilateral lower extremity edema-per patient chronic. With chronic stasis changes.  Per chart weight appears stable.  Chest x-ray -pulm inflation with bronchovascular crowding, question tiny effusions.  History of pulmonary hypertension.  Echo 09/2021 shows severely elevated pulmonary artery systolic pressure. RV systolic pressure of 62. He received IV Lasix -Appears chronic overall with venous stasis  Stage 3b chronic kidney disease (HCC) Cr 1.4, close to baseline 1-1.2. -- renally adjust medications, avoid nephrotoxic agents / dehydration  / hypotension   CAD  (coronary artery disease) Denies chest pain.  EKG- unchanged.  Troponin 17 x 2.  Follows with cardiology.    Hypertension Blood pressure elevated up to 200/111. -Norvasc 5 mg, carvedilol 6.25 mg twice daily resumed -IV Hydralazine as needed 5 mg, systolic greater than 180   Status is: Inpatient   Disposition: The patient is from: Home              Anticipated d/c is to: SNF              Anticipated d/c date is: 1 day              Patient currently is not medically stable to d/c. Barriers: Not Clinically Stable-   Code Status :  -  Code Status: Full Code   Family Communication:     (patient is alert, awake and coherent)   DVT Prophylaxis  :   - SCDs  enoxaparin (LOVENOX) injection 40 mg Start: 07/29/22 2200   Lab Results  Component Value Date   PLT 143 (L) 07/30/2022   Inpatient Medications  Scheduled Meds:  [START ON 07/31/2022] amLODipine  10 mg Oral Daily   carvedilol  6.25 mg Oral BID WC   Chlorhexidine Gluconate Cloth  6 each Topical Daily   enoxaparin (LOVENOX) injection  40 mg Subcutaneous Q24H   rosuvastatin  5 mg Oral Daily   Continuous Infusions:  cefTRIAXone (ROCEPHIN)  IV Stopped (07/29/22 2246)   PRN Meds:.acetaminophen **OR** acetaminophen, hydrALAZINE, ondansetron **OR** ondansetron (ZOFRAN) IV, polyethylene glycol   Anti-infectives (From admission, onward)    Start     Dose/Rate Route Frequency Ordered Stop   07/29/22 2200  cefTRIAXone (ROCEPHIN) 2 g  in sodium chloride 0.9 % 100 mL IVPB        2 g 200 mL/hr over 30 Minutes Intravenous Every 24 hours 07/29/22 2157     07/29/22 1715  ciprofloxacin (CIPRO) tablet 500 mg        500 mg Oral  Once 07/29/22 1713 07/29/22 1721       Subjective: Terrance Miller today has no fevers, no emesis,  No chest pain,   - Oral intake is fair but not great   Objective: Vitals:   07/30/22 0847 07/30/22 1211 07/30/22 1452 07/30/22 1808  BP: (!) 176/91 (!) 161/89 (!) 156/92 (!) 157/80  Pulse: 60 (!) 57 (!) 54    Resp:      Temp: (!) 97.5 F (36.4 C)  (!) 97.5 F (36.4 C)   TempSrc: Oral  Oral   SpO2: 98% 100% 100%   Weight:      Height:        Intake/Output Summary (Last 24 hours) at 07/30/2022 1944 Last data filed at 07/30/2022 1800 Gross per 24 hour  Intake 940 ml  Output 950 ml  Net -10 ml   Filed Weights   07/29/22 1209 07/30/22 0016  Weight: 94.8 kg 107.1 kg    Physical Exam  Gen:- Awake Alert, no acute distress HEENT:- [place].AT, No sclera icterus Neck-Supple Neck,No JVD,.  Lungs-  CTAB , fair symmetrical air movement CV- S1, S2 normal, regular  Abd-  +ve B.Sounds, Abd Soft, No tenderness,    Extremity/Skin:-Chronic venous stasis and discoloration of lower extremities, pedal pulses present  Psych-affect is appropriate, oriented x3 Neuro-generalized weakness, no new focal deficits, no tremors GU-chronic indwelling Foley--POA  Data Reviewed: I have personally reviewed following labs and imaging studies  CBC: Recent Labs  Lab 07/29/22 1220 07/30/22 0308  WBC 5.1 6.2  NEUTROABS 3.6  --   HGB 8.5* 9.0*  HCT 26.4* 27.2*  MCV 103.9* 103.8*  PLT 142* 143*   Basic Metabolic Panel: Recent Labs  Lab 07/29/22 1220 07/29/22 1422 07/30/22 0308  NA 143  --  145  K 3.2*  --  3.3*  CL 115*  --  115*  CO2 20*  --  22  GLUCOSE 106*  --  102*  BUN 19  --  19  CREATININE 1.42*  --  1.48*  CALCIUM 8.4*  --  8.6*  MG  --  2.2  --    GFR: Estimated Creatinine Clearance: 37.9 mL/min (A) (by C-G formula based on SCr of 1.48 mg/dL (H)). Liver Function Tests: Recent Labs  Lab 07/29/22 1220  AST 23  ALT 16  ALKPHOS 50  BILITOT 2.0*  PROT 7.1  ALBUMIN 3.2*   Recent Results (from the past 240 hour(s))  Resp panel by RT-PCR (RSV, Flu A&B, Covid) Anterior Nasal Swab     Status: None   Collection Time: 07/29/22 12:08 PM   Specimen: Anterior Nasal Swab  Result Value Ref Range Status   SARS Coronavirus 2 by RT PCR NEGATIVE NEGATIVE Final    Comment: (NOTE) SARS-CoV-2 target  nucleic acids are NOT DETECTED.  The SARS-CoV-2 RNA is generally detectable in upper respiratory specimens during the acute phase of infection. The lowest concentration of SARS-CoV-2 viral copies this assay can detect is 138 copies/mL. A negative result does not preclude SARS-Cov-2 infection and should not be used as the sole basis for treatment or other patient management decisions. A negative result may occur with  improper specimen collection/handling, submission of specimen other than nasopharyngeal  swab, presence of viral mutation(s) within the areas targeted by this assay, and inadequate number of viral copies(<138 copies/mL). A negative result must be combined with clinical observations, patient history, and epidemiological information. The expected result is Negative.  Fact Sheet for Patients:  BloggerCourse.com  Fact Sheet for Healthcare Providers:  SeriousBroker.it  This test is no t yet approved or cleared by the [place] FDA and  has been authorized for detection and/or diagnosis of SARS-CoV-2 by FDA under an Emergency Use Authorization (EUA). This EUA will remain  in effect (meaning this test can be used) for the duration of the COVID-19 declaration under Section 564(b)(1) of the Act, 21 U.S.C.section 360bbb-3(b)(1), unless the authorization is terminated  or revoked sooner.       Influenza A by PCR NEGATIVE NEGATIVE Final   Influenza B by PCR NEGATIVE NEGATIVE Final    Comment: (NOTE) The Xpert Xpress SARS-CoV-2/FLU/RSV plus assay is intended as an aid in the diagnosis of influenza from Nasopharyngeal swab specimens and should not be used as a sole basis for treatment. Nasal washings and aspirates are unacceptable for Xpert Xpress SARS-CoV-2/FLU/RSV testing.  Fact Sheet for Patients: BloggerCourse.com  Fact Sheet for Healthcare  Providers: SeriousBroker.it  This test is not yet approved or cleared by the [place] FDA and has been authorized for detection and/or diagnosis of SARS-CoV-2 by FDA under an Emergency Use Authorization (EUA). This EUA will remain in effect (meaning this test can be used) for the duration of the COVID-19 declaration under Section 564(b)(1) of the Act, 21 U.S.C. section 360bbb-3(b)(1), unless the authorization is terminated or revoked.     Resp Syncytial Virus by PCR NEGATIVE NEGATIVE Final    Comment: (NOTE) Fact Sheet for Patients: BloggerCourse.com  Fact Sheet for Healthcare Providers: SeriousBroker.it  This test is not yet approved or cleared by the [place] FDA and has been authorized for detection and/or diagnosis of SARS-CoV-2 by FDA under an Emergency Use Authorization (EUA). This EUA will remain in effect (meaning this test can be used) for the duration of the COVID-19 declaration under Section 564(b)(1) of the Act, 21 U.S.C. section 360bbb-3(b)(1), unless the authorization is terminated or revoked.  Performed at [facility], [address] [place], [place] [address]   Blood Culture (routine x 2)     Status: None (Preliminary result)   Collection Time: 07/29/22 12:18 PM   Specimen: Blood  Result Value Ref Range Status   Specimen Description BLOOD RIGHT FOREARM  Final   Special Requests   Final    BOTTLES DRAWN AEROBIC ONLY Blood Culture results may not be optimal due to an inadequate volume of blood received in culture bottles Performed at [facility], [address], [place], [place] [address]    Culture PENDING  Incomplete   Report Status PENDING  Incomplete  Blood Culture (routine x 2)     Status: None (Preliminary result)   Collection Time: 07/29/22 12:20 PM   Specimen: Blood  Result Value Ref Range Status   Specimen Description LEFT ANTECUBITAL  Final   Special Requests    Final    BOTTLES DRAWN AEROBIC AND ANAEROBIC Blood Culture adequate volume Performed at [facility], [address] [place], [place] [address]    Culture PENDING  Incomplete   Report Status PENDING  Incomplete     Radiology Studies: CT Head Wo Contrast  Result Date: 07/29/2022 CLINICAL DATA:  Mental status change, unknown cause EXAM: CT HEAD WITHOUT CONTRAST TECHNIQUE: Contiguous axial images were obtained  from the base of the skull through the vertex without intravenous contrast. RADIATION DOSE REDUCTION: This exam was performed according to the departmental dose-optimization program which includes automated exposure control, adjustment of the mA and/or kV according to patient size and/or use of iterative reconstruction technique. COMPARISON:  CT Head 11/30/21 FINDINGS: Brain: No evidence of acute infarction, hemorrhage, hydrocephalus, extra-axial collection or mass lesion/mass effect. Sequela of mild chronic microvascular ischemic change. Generalized volume loss. Vascular: No hyperdense vessel or unexpected calcification. Skull: Negative for fracture or focal lesion. There is asymmetric soft thickening surrounding the right EAC. Correlate with physical exam to exclude the possibility of infection. Sinuses/Orbits: No middle ear or mastoid effusion. Paranasal sinuses are notable for mucosal thickening in the right maxillary and right sphenoid sinus with osseous changes suggestive chronic right maxillary sinusitis. Orbits are notable for bilateral lens replacement, otherwise unremarkable. Other: None. IMPRESSION: 1. No CT finding to explain altered mental status. 2. Asymmetric soft tissue thickening surrounding the right EAC. Correlate with physical exam to exclude the possibility of infection. Electronically Signed   By: Lorenza Cambridge M.D.   On: 07/29/2022 14:11   DG Chest Port 1 View  Result Date: 07/29/2022 CLINICAL DATA:  Sepsis. EXAM: PORTABLE CHEST 1 VIEW COMPARISON:  01/27/2022 FINDINGS:  Underinflation. Stable cardiopericardial silhouette. Tiny effusions. Interstitial changes. No pneumothorax. Apical pleural thickening. Overlapping cardiac leads. Degenerative changes are seen of the spine. Calcified aorta. [place] overlying the left hemithorax. IMPRESSION: Poor inflation with bronchovascular crowding. Question tiny effusions. Recommend follow up x-ray with improved inflation when clinically appropriate Electronically Signed   By: Karen Kays M.D.   On: 07/29/2022 13:32    Scheduled Meds:  [START ON 07/31/2022] amLODipine  10 mg Oral Daily   carvedilol  6.25 mg Oral BID WC   Chlorhexidine Gluconate Cloth  6 each Topical Daily   enoxaparin (LOVENOX) injection  40 mg Subcutaneous Q24H   rosuvastatin  5 mg Oral Daily   Continuous Infusions:  cefTRIAXone (ROCEPHIN)  IV Stopped (07/29/22 2246)    LOS: 1 day   Shon Hale M.D on 07/30/2022 at 7:44 PM  Go to [email or web address] - for contact info  [facility] - Office  [phone]  If 7PM-7AM, please contact night-coverage [email or web address] 07/30/2022, 7:44 PM

## 2022-07-31 ENCOUNTER — Ambulatory Visit: Payer: Medicare Other

## 2022-07-31 DIAGNOSIS — G9341 Metabolic encephalopathy: Secondary | ICD-10-CM | POA: Diagnosis not present

## 2022-07-31 LAB — BLOOD CULTURE ID PANEL (REFLEXED) - BCID2
A.calcoaceticus-baumannii: NOT DETECTED
Bacteroides fragilis: NOT DETECTED
Candida albicans: NOT DETECTED
Candida auris: NOT DETECTED
Candida glabrata: NOT DETECTED
Candida krusei: NOT DETECTED
Candida tropicalis: NOT DETECTED
Cryptococcus neoformans/gattii: NOT DETECTED
Enterobacter cloacae complex: NOT DETECTED
Enterobacterales: NOT DETECTED
Enterococcus Faecium: NOT DETECTED
Enterococcus faecalis: NOT DETECTED
Escherichia coli: NOT DETECTED
Haemophilus influenzae: NOT DETECTED
Klebsiella aerogenes: NOT DETECTED
Klebsiella oxytoca: NOT DETECTED
Klebsiella pneumoniae: NOT DETECTED
Listeria monocytogenes: NOT DETECTED
Methicillin resistance mecA/C: DETECTED — AB
Proteus species: NOT DETECTED
Pseudomonas aeruginosa: NOT DETECTED
Salmonella species: NOT DETECTED
Serratia marcescens: NOT DETECTED
Staphylococcus aureus (BCID): NOT DETECTED
Staphylococcus species: DETECTED — AB
Stenotrophomonas maltophilia: NOT DETECTED
Streptococcus agalactiae: NOT DETECTED
Streptococcus pneumoniae: NOT DETECTED

## 2022-07-31 LAB — URINE CULTURE

## 2022-07-31 LAB — RENAL FUNCTION PANEL
Albumin: 3 g/dL — ABNORMAL LOW (ref 3.5–5.0)
Anion gap: 5 (ref 5–15)
CO2: 21 mmol/L — ABNORMAL LOW (ref 22–32)
Calcium: 8.3 mg/dL — ABNORMAL LOW (ref 8.9–10.3)
Chloride: 114 mmol/L — ABNORMAL HIGH (ref 98–111)
Creatinine, Ser: 1.44 mg/dL — ABNORMAL HIGH (ref 0.61–1.24)
GFR, Estimated: 46 mL/min — ABNORMAL LOW (ref >60)
Glucose, Bld: 119 mg/dL — ABNORMAL HIGH (ref 70–99)
Phosphorus: 3.4 mg/dL (ref 2.5–4.6)
Sodium: 140 mmol/L (ref 135–145)

## 2022-07-31 LAB — CULTURE, BLOOD (ROUTINE X 2): Special Requests: ADEQUATE

## 2022-07-31 LAB — MRSA NEXT GEN BY PCR, NASAL: MRSA by PCR Next Gen: DETECTED — AB

## 2022-07-31 MED ORDER — ENOXAPARIN SODIUM 60 MG/0.6ML IJ SOSY
0.5000 mg/kg | PREFILLED_SYRINGE | INTRAMUSCULAR | Status: DC
  Administered 2022-07-31 – 2022-08-01 (×2): 52.5 mg via SUBCUTANEOUS
  Filled 2022-07-31 (×2): qty 0.6

## 2022-07-31 MED ORDER — MUPIROCIN 2 % EX OINT
1.0000 | TOPICAL_OINTMENT | Freq: Two times a day (BID) | CUTANEOUS | Status: DC
  Administered 2022-07-31 – 2022-08-02 (×4): 1 via NASAL
  Filled 2022-07-31: qty 22

## 2022-07-31 MED ORDER — CHLORHEXIDINE GLUCONATE CLOTH 2 % EX PADS
6.0000 | MEDICATED_PAD | Freq: Every day | CUTANEOUS | Status: DC
  Administered 2022-08-01 – 2022-08-02 (×2): 6 via TOPICAL

## 2022-07-31 NOTE — Consult Note (Signed)
PHARMACIST - PHYSICIAN COMMUNICATION  CONCERNING:  Enoxaparin (Lovenox) for DVT Prophylaxis   ASSESSMENT: Patient was prescribed enoxaparin 40 mg subcutaneously every 24 hours for VTE prophylaxis.   Body mass index is 36.98 kg/m.  Estimated Creatinine Clearance: 39 mL/min (A) (by C-G formula based on SCr of 1.44 mg/dL (H)).  Based on [facility] policy, patient qualifies for enoxaparin dosing of 0.5 mg per kilogram of total body weight every 24 hours because their body mass index is >30 kg/m2.  PLAN: Pharmacy has adjusted enoxaparin dose per [facility] policy.  Description: Patient is now receiving enoxaparin 0.5 mg/kg subcutaneously every 24 hours.  Will M. Dareen Piano, PharmD Clinical Pharmacist 07/31/2022 11:15 AM

## 2022-07-31 NOTE — Progress Notes (Signed)
Physical Therapy Treatment Patient Details Name: Terrance Miller MRN: [id number] DOB: 11/13/1928 Today's Date: 07/31/2022   History of Present Illness Terrance Miller is a 89 y.o. male with medical history significant for MGUS, diabetes mellitus, coronary artery disease, hypertension, aortic regurgitation.  Patient presented to the ED with complaints of generalized weakness over the past 5 days, reports that he cannot ambulate, and dark urine.  He has a chronic indwelling Foley catheter.  At the time of my evaluation, patient is awake alert able to answer simple questions, speech sometimes hard to understand.  He denies chest pain, no difficulty breathing.  He has chronic bilateral lower extremity swelling, he reports pain to his right ankle.    PT Comments  Pt supine in bed and willing to participate with therapy.  Pt limited by weakness noted with increased time and mod A required with bed mobility and transfer training.  Pt presents iwht slow labored movements and required some cueing for hand placement to assist with scooting to EOB and sit to stand.  Pt able to ambulate 49ft with RW, slow labored movements with no LOB episodes.  EOS pt left in chair with call bell within reach and LE reclined.       Assistance Recommended at Discharge    If plan is discharge home, recommend the following:  Can travel by private vehicle           Equipment Recommendations       Recommendations for Other Services       Precautions / Restrictions Precautions Precautions: Fall Restrictions Weight Bearing Restrictions: No     Mobility  Bed Mobility Overal bed mobility: Needs Assistance Bed Mobility: Supine to Sit     Supine to sit: Mod assist     General bed mobility comments: increased time, labored movment with most diffiuclty scooting to EOB due to swollen scrotal area    Transfers Overall transfer level: Needs assistance Equipment used: Rolling walker (2 wheels)   Sit to Stand: Mod  assist           General transfer comment: had difficulty completing sit to stands due to BLE weakness, elevated bed height and cueing for hand placement to assist to standing    Ambulation/Gait Ambulation/Gait assistance: Min assist, Mod assist Gait Distance (Feet): 40 Feet Assistive device: Rolling walker (2 wheels) Gait Pattern/deviations: Decreased step length - right, Decreased step length - left, Decreased stride length Gait velocity: decreased     General Gait Details: slow labored cadence without loss of balance and limited mostly due to c/o fatigue   Stairs             Wheelchair Mobility     Tilt Bed    Modified Rankin (Stroke Patients Only)       Balance                                            Cognition Arousal/Alertness: Awake/alert Behavior During Therapy: WFL for tasks assessed/performed Overall Cognitive Status: Within Functional Limits for tasks assessed                                          Exercises      General Comments        Pertinent Vitals/Pain Pain Assessment  Pain Assessment: No/denies pain    Home Living                          Prior Function            PT Goals (current goals can now be found in the care plan section)      Frequency           PT Plan Current plan remains appropriate    Co-evaluation              AM-PAC PT "6 Clicks" Mobility   Outcome Measure  Help needed turning from your back to your side while in a flat bed without using bedrails?: A Little Help needed moving from lying on your back to sitting on the side of a flat bed without using bedrails?: A Lot Help needed moving to and from a bed to a chair (including a wheelchair)?: A Lot Help needed standing up from a chair using your arms (e.g., wheelchair or bedside chair)?: A Lot Help needed to walk in hospital room?: A Little Help needed climbing 3-5 steps with a railing? : A  Lot 6 Click Score: 14    End of Session Equipment Utilized During Treatment: Gait belt Activity Tolerance: Patient tolerated treatment well;Patient limited by fatigue Patient left: in chair;with call bell/phone within reach Nurse Communication: Mobility status       Time: 1308-6578 PT Time Calculation (min) (ACUTE ONLY): 23 min  Charges:    $Therapeutic Exercise: 23-37 mins PT General Charges $$ ACUTE PT VISIT: 1 Visit                     Becky Sax, LPTA/CLT; CBIS [phone]  Juel Burrow 07/31/2022, 9:44 AM

## 2022-07-31 NOTE — Progress Notes (Signed)
Hospitalist notified of 1 bottle blood cx positive for gram cocci, no new orders at this time.

## 2022-07-31 NOTE — Progress Notes (Addendum)
Bladder scan completed considering patient had some leakage from coude indwelling catheter this was done 31ml shown. MD made aware, at this time MD recommends leaving current coude catheter in and urology consult was placed and they will intervene tomorrow.

## 2022-07-31 NOTE — Progress Notes (Signed)
PHARMACY - PHYSICIAN COMMUNICATION CRITICAL VALUE ALERT - BLOOD CULTURE IDENTIFICATION (BCID)  Terrance Miller is an 89 y.o. male who presented to [facility] on 07/29/2022 with a chief complaint of weakness  Assessment:  89 y.o. male with medical history significant for MGUS, diabetes mellitus, coronary artery disease, hypertension, aortic regurgitation admitted on 07/29/2022 with acute metabolic encephalopathy with concerns for UTI . Micro lab reported BCID with staph epi in 1 or 3 bottles, likely contaminant. D/W MD and will continue to monitor  Name of physician (or Provider) Contacted: Dr. Mariea Clonts  Current antibiotics: Ceftriaxone 2gm IV q24h  Changes to prescribed antibiotics recommended:  No changes needed, continue current regimen  Results for orders placed or performed during the hospital encounter of 07/29/22  Blood Culture ID Panel (Reflexed) (Collected: 07/29/2022 12:18 PM)  Result Value Ref Range   Enterococcus faecalis NOT DETECTED NOT DETECTED   Enterococcus Faecium NOT DETECTED NOT DETECTED   Listeria monocytogenes NOT DETECTED NOT DETECTED   Staphylococcus species DETECTED (A) NOT DETECTED   Staphylococcus aureus (BCID) NOT DETECTED NOT DETECTED   Staphylococcus epidermidis DETECTED (A) NOT DETECTED   Staphylococcus lugdunensis NOT DETECTED NOT DETECTED   Streptococcus species NOT DETECTED NOT DETECTED   Streptococcus agalactiae NOT DETECTED NOT DETECTED   Streptococcus pneumoniae NOT DETECTED NOT DETECTED   Streptococcus pyogenes NOT DETECTED NOT DETECTED   A.calcoaceticus-baumannii NOT DETECTED NOT DETECTED   Bacteroides fragilis NOT DETECTED NOT DETECTED   Enterobacterales NOT DETECTED NOT DETECTED   Enterobacter cloacae complex NOT DETECTED NOT DETECTED   Escherichia coli NOT DETECTED NOT DETECTED   Klebsiella aerogenes NOT DETECTED NOT DETECTED   Klebsiella oxytoca NOT DETECTED NOT DETECTED   Klebsiella pneumoniae NOT DETECTED NOT DETECTED   Proteus species NOT  DETECTED NOT DETECTED   Salmonella species NOT DETECTED NOT DETECTED   Serratia marcescens NOT DETECTED NOT DETECTED   Haemophilus influenzae NOT DETECTED NOT DETECTED   Neisseria meningitidis NOT DETECTED NOT DETECTED   Pseudomonas aeruginosa NOT DETECTED NOT DETECTED   Stenotrophomonas maltophilia NOT DETECTED NOT DETECTED   Candida albicans NOT DETECTED NOT DETECTED   Candida auris NOT DETECTED NOT DETECTED   Candida glabrata NOT DETECTED NOT DETECTED   Candida krusei NOT DETECTED NOT DETECTED   Candida parapsilosis NOT DETECTED NOT DETECTED   Candida tropicalis NOT DETECTED NOT DETECTED   Cryptococcus neoformans/gattii NOT DETECTED NOT DETECTED   Methicillin resistance mecA/C DETECTED (A) NOT DETECTED    Tera Mater 07/31/2022  10:43 AM

## 2022-07-31 NOTE — Plan of Care (Signed)

## 2022-07-31 NOTE — Plan of Care (Signed)

## 2022-07-31 NOTE — Progress Notes (Signed)
PROGRESS NOTE   Terrance Miller, is a 89 y.o. male, DOB - [birth year], QMV:[id number]  Admit date - 07/29/2022   Admitting Physician Terrance Boer, MD  Outpatient Primary MD for the patient is Terrance Sams, DO  LOS - 2  Chief Complaint  Patient presents with   Weakness    Pt complains of generalized weakness       Brief Narrative:  89 y.o. male with medical history significant for MGUS, diabetes mellitus, coronary artery disease, hypertension, aortic regurgitation admitted on 07/29/2022 with acute metabolic encephalopathy with concerns for UTI    -Assessment and Plan: 1) Acute metabolic encephalopathy Reported confusion, weakness unable to ambulate.  Head CT negative for acute abnormality, asymmetric soft tissue thickening around the right EAC.  Chronic indwelling Foley catheter.  Possible UTI with UA showing few bacteria greater than 50 WBCs.  Rules out for sepsis.  Lactic acid 1.3 > 1.6.  Chest x-ray poor inflation with bronchovascular crowding question tiny effusion.  Per outpatient notes 7/19-patient requiring more care than daughter is able to handle, seeking placement for patient. -Last urine culture on file 01/2022 grew MRSA, treated with vancomycin >> linezolid -IV ceftriaxone 2 g daily Patient has chronic indwelling Foley--POA -Consider changing Foley prior to discharge  2)Urinary Retention/Chronic Foley Catheter-- - He underwent bilateral hydrocelectomy by Dr. Pete Miller in 11/2021 -The patient developed wound dehiscence which has been treated with dressing changes since that time. -He has had continued healing of his scrotal wound-- -  He has an indwelling Foley catheter--usually changed once a month at urology office -He has limited mobility - Does have fecal incontinence-wears daper Having leakage around caude foley catheter---urology consult requested  Lower extremity edema 2 + Pitting bilateral lower extremity edema-per patient chronic. With chronic stasis  changes.  Per chart weight appears stable.  Chest x-ray -pulm inflation with bronchovascular crowding, question tiny effusions.  History of pulmonary hypertension.  Echo 09/2021 shows severely elevated pulmonary artery systolic pressure. RV systolic pressure of 62. He received IV Lasix -Appears chronic overall with venous stasis  Stage 3b chronic kidney disease (HCC) Cr 1.4, close to baseline 1-1.2. -- renally adjust medications, avoid nephrotoxic agents / dehydration  / hypotension   CAD (coronary artery disease) Denies chest pain.  EKG- unchanged.  Troponin 17 x 2.  Follows with cardiology.    Hypertension Blood pressure elevated up to 200/111. -Norvasc 5 mg, carvedilol 6.25 mg twice daily resumed -IV Hydralazine as needed 5 mg, systolic greater than 180  Chronic anemia--Hgb stable, no bleeding concerns  Generalized weakness and ambulatory dysfunction--- physical therapy eval appreciated recommends SNF rehab awaiting transfer to SNF rehab pending insurance authorization  Status is: Inpatient   Disposition: The patient is from: Home              Anticipated d/c is to: SNF              Anticipated d/c date is: 1 day              Patient currently is medically stable to d/c. Barriers: awaiting transfer to SNF rehab pending insurance authorization  Code Status :  -  Code Status: Full Code   Family Communication:     (patient is alert, awake and coherent)  -I called and updated patient's daughter Ms Terrance Miller by phone on 07/31/2022  DVT Prophylaxis  :   - SCDs     Lab Results  Component Value Date   PLT 143 (L) 07/30/2022  Inpatient Medications  Scheduled Meds:  amLODipine  10 mg Oral Daily   carvedilol  6.25 mg Oral BID WC   Chlorhexidine Gluconate Cloth  6 each Topical Daily   [START ON 08/01/2022] Chlorhexidine Gluconate Cloth  6 each Topical Q0600   enoxaparin (LOVENOX) injection  0.5 mg/kg Subcutaneous Q24H   mupirocin ointment  1 Application Nasal BID    rosuvastatin  5 mg Oral Daily   Continuous Infusions:  cefTRIAXone (ROCEPHIN)  IV Stopped (07/30/22 2240)   PRN Meds:.acetaminophen **OR** acetaminophen, hydrALAZINE, ondansetron **OR** ondansetron (ZOFRAN) IV, polyethylene glycol   Anti-infectives (From admission, onward)    Start     Dose/Rate Route Frequency Ordered Stop   07/29/22 2200  cefTRIAXone (ROCEPHIN) 2 g in sodium chloride 0.9 % 100 mL IVPB        2 g 200 mL/hr over 30 Minutes Intravenous Every 24 hours 07/29/22 2157     07/29/22 1715  ciprofloxacin (CIPRO) tablet 500 mg        500 mg Oral  Once 07/29/22 1713 07/29/22 1721       Subjective: Terrance Miller today has no fevers, no emesis,  No chest pain,   - Oral intake is fair but not great awaiting transfer to SNF rehab pending insurance authorization  Objective: Vitals:   07/30/22 1808 07/30/22 2057 07/31/22 0433 07/31/22 1403  BP: (!) 157/80 (!) 151/76 (!) 154/84 (!) 158/74  Pulse:  (!) 54 (!) 55 (!) 53  Resp:  18 17 16   Temp:  (!) 97.5 F (36.4 C) 98 F (36.7 C) 97.6 F (36.4 C)  TempSrc:  Oral Oral Oral  SpO2:  98% 99% 100%  Weight:      Height:        Intake/Output Summary (Last 24 hours) at 07/31/2022 1939 Last data filed at 07/31/2022 1836 Gross per 24 hour  Intake 720 ml  Output 100 ml  Net 620 ml   Filed Weights   07/29/22 1209 07/30/22 0016  Weight: 94.8 kg 107.1 kg    Physical Exam  Gen:- Awake Alert, no acute distress HEENT:- [place].AT, No sclera icterus Neck-Supple Neck,No JVD,.  Lungs-  CTAB , fair symmetrical air movement CV- S1, S2 normal, regular  Abd-  +ve B.Sounds, Abd Soft, No tenderness,    Extremity/Skin:-Chronic venous stasis and discoloration of lower extremities, pedal pulses present  Psych-affect is appropriate, oriented x3 Neuro-generalized weakness, no new focal deficits, no tremors GU-chronic indwelling Foley--POA  Data Reviewed: I have personally reviewed following labs and imaging studies  CBC: Recent Labs  Lab  07/29/22 1220 07/30/22 0308  WBC 5.1 6.2  NEUTROABS 3.6  --   HGB 8.5* 9.0*  HCT 26.4* 27.2*  MCV 103.9* 103.8*  PLT 142* 143*   Basic Metabolic Panel: Recent Labs  Lab 07/29/22 1220 07/29/22 1422 07/30/22 0308 07/31/22 0426  NA 143  --  145 140  K 3.2*  --  3.3* 3.6  CL 115*  --  115* 114*  CO2 20*  --  22 21*  GLUCOSE 106*  --  102* 119*  BUN 19  --  19 22  CREATININE 1.42*  --  1.48* 1.44*  CALCIUM 8.4*  --  8.6* 8.3*  MG  --  2.2  --   --   PHOS  --   --   --  3.4   GFR: Estimated Creatinine Clearance: 39 mL/min (A) (by C-G formula based on SCr of 1.44 mg/dL (H)). Liver Function Tests: Recent Labs  Lab  07/29/22 1220 07/31/22 0426  AST 23  --   ALT 16  --   ALKPHOS 50  --   BILITOT 2.0*  --   PROT 7.1  --   ALBUMIN 3.2* 3.0*   Recent Results (from the past 240 hour(s))  Resp panel by RT-PCR (RSV, Flu A&B, Covid) Anterior Nasal Swab     Status: None   Collection Time: 07/29/22 12:08 PM   Specimen: Anterior Nasal Swab  Result Value Ref Range Status   SARS Coronavirus 2 by RT PCR NEGATIVE NEGATIVE Final    Comment: (NOTE) SARS-CoV-2 target nucleic acids are NOT DETECTED.  The SARS-CoV-2 RNA is generally detectable in upper respiratory specimens during the acute phase of infection. The lowest concentration of SARS-CoV-2 viral copies this assay can detect is 138 copies/mL. A negative result does not preclude SARS-Cov-2 infection and should not be used as the sole basis for treatment or other patient management decisions. A negative result may occur with  improper specimen collection/handling, submission of specimen other than nasopharyngeal swab, presence of viral mutation(s) within the areas targeted by this assay, and inadequate number of viral copies(<138 copies/mL). A negative result must be combined with clinical observations, patient history, and epidemiological information. The expected result is Negative.  Fact Sheet for Patients:   BloggerCourse.com  Fact Sheet for Healthcare Providers:  SeriousBroker.it  This test is no t yet approved or cleared by the [place] FDA and  has been authorized for detection and/or diagnosis of SARS-CoV-2 by FDA under an Emergency Use Authorization (EUA). This EUA will remain  in effect (meaning this test can be used) for the duration of the COVID-19 declaration under Section 564(b)(1) of the Act, 21 U.S.C.section 360bbb-3(b)(1), unless the authorization is terminated  or revoked sooner.       Influenza A by PCR NEGATIVE NEGATIVE Final   Influenza B by PCR NEGATIVE NEGATIVE Final    Comment: (NOTE) The Xpert Xpress SARS-CoV-2/FLU/RSV plus assay is intended as an aid in the diagnosis of influenza from Nasopharyngeal swab specimens and should not be used as a sole basis for treatment. Nasal washings and aspirates are unacceptable for Xpert Xpress SARS-CoV-2/FLU/RSV testing.  Fact Sheet for Patients: BloggerCourse.com  Fact Sheet for Healthcare Providers: SeriousBroker.it  This test is not yet approved or cleared by the [place] FDA and has been authorized for detection and/or diagnosis of SARS-CoV-2 by FDA under an Emergency Use Authorization (EUA). This EUA will remain in effect (meaning this test can be used) for the duration of the COVID-19 declaration under Section 564(b)(1) of the Act, 21 U.S.C. section 360bbb-3(b)(1), unless the authorization is terminated or revoked.     Resp Syncytial Virus by PCR NEGATIVE NEGATIVE Final    Comment: (NOTE) Fact Sheet for Patients: BloggerCourse.com  Fact Sheet for Healthcare Providers: SeriousBroker.it  This test is not yet approved or cleared by the [place] FDA and has been authorized for detection and/or diagnosis of SARS-CoV-2 by FDA under an Emergency Use  Authorization (EUA). This EUA will remain in effect (meaning this test can be used) for the duration of the COVID-19 declaration under Section 564(b)(1) of the Act, 21 U.S.C. section 360bbb-3(b)(1), unless the authorization is terminated or revoked.  Performed at [facility], [address], [place], [place] [address]   Blood Culture (routine x 2)     Status: None (Preliminary result)   Collection Time: 07/29/22 12:18 PM   Specimen: BLOOD RIGHT FOREARM  Result Value Ref Range Status  Specimen Description   Final    BLOOD RIGHT FOREARM Performed at [facility], [address] [place], [place] [address]    Special Requests   Final    BOTTLES DRAWN AEROBIC ONLY Blood Culture results may not be optimal due to an inadequate volume of blood received in culture bottles Performed at [facility], [address], [place], [place] [address]    Culture  Setup Time   Final    GRAM POSITIVE COCCI AEROBIC BOTTLE ONLY CRITICAL RESULT CALLED TO, READ BACK BY AND VERIFIED WITH: D JONES AT 0405 07/31/2022 BY T KENNEDY CRITICAL RESULT CALLED TO, READ BACK BY AND VERIFIED WITH: PHARM D L.POOLE AT 1039 ON 07/31/2022 BY T.SAAD. Performed at [facility], [address], [place], [place] [address]    Culture GRAM POSITIVE COCCI  Final   Report Status PENDING  Incomplete  Blood Culture ID Panel (Reflexed)     Status: Abnormal   Collection Time: 07/29/22 12:18 PM  Result Value Ref Range Status   Enterococcus faecalis NOT DETECTED NOT DETECTED Final   Enterococcus Faecium NOT DETECTED NOT DETECTED Final   Listeria monocytogenes NOT DETECTED NOT DETECTED Final   Staphylococcus species DETECTED (A) NOT DETECTED Final    Comment: CRITICAL RESULT CALLED TO, READ BACK BY AND VERIFIED WITH: PHARM D L.POOLE AT 1039 ON 07/31/2022 BY T.SAAD.    Staphylococcus aureus (BCID) NOT DETECTED NOT DETECTED Final   Staphylococcus epidermidis DETECTED (A) NOT DETECTED Final    Comment: Methicillin (oxacillin)  resistant coagulase negative staphylococcus. Possible blood culture contaminant (unless isolated from more than one blood culture draw or clinical case suggests pathogenicity). No antibiotic treatment is indicated for blood  culture contaminants. CRITICAL RESULT CALLED TO, READ BACK BY AND VERIFIED WITH: PHARM D L.POOLE AT 1039 ON 07/31/2022 BY T.SAAD.    Staphylococcus lugdunensis NOT DETECTED NOT DETECTED Final   Streptococcus species NOT DETECTED NOT DETECTED Final   Streptococcus agalactiae NOT DETECTED NOT DETECTED Final   Streptococcus pneumoniae NOT DETECTED NOT DETECTED Final   Streptococcus pyogenes NOT DETECTED NOT DETECTED Final   A.calcoaceticus-baumannii NOT DETECTED NOT DETECTED Final   Bacteroides fragilis NOT DETECTED NOT DETECTED Final   Enterobacterales NOT DETECTED NOT DETECTED Final   Enterobacter cloacae complex NOT DETECTED NOT DETECTED Final   Escherichia coli NOT DETECTED NOT DETECTED Final   Klebsiella aerogenes NOT DETECTED NOT DETECTED Final   Klebsiella oxytoca NOT DETECTED NOT DETECTED Final   Klebsiella pneumoniae NOT DETECTED NOT DETECTED Final   Proteus species NOT DETECTED NOT DETECTED Final   Salmonella species NOT DETECTED NOT DETECTED Final   Serratia marcescens NOT DETECTED NOT DETECTED Final   Haemophilus influenzae NOT DETECTED NOT DETECTED Final   Neisseria meningitidis NOT DETECTED NOT DETECTED Final   Pseudomonas aeruginosa NOT DETECTED NOT DETECTED Final   Stenotrophomonas maltophilia NOT DETECTED NOT DETECTED Final   Candida albicans NOT DETECTED NOT DETECTED Final   Candida auris NOT DETECTED NOT DETECTED Final   Candida glabrata NOT DETECTED NOT DETECTED Final   Candida krusei NOT DETECTED NOT DETECTED Final   Candida parapsilosis NOT DETECTED NOT DETECTED Final   Candida tropicalis NOT DETECTED NOT DETECTED Final   Cryptococcus neoformans/gattii NOT DETECTED NOT DETECTED Final   Methicillin resistance mecA/C DETECTED (A) NOT DETECTED  Final    Comment: CRITICAL RESULT CALLED TO, READ BACK BY AND VERIFIED WITH: PHARM D L.POOLE AT 1039 ON 07/31/2022 BY T.SAAD. Performed at [facility], [address],  [place], [place] [address]   Blood Culture (routine x 2)     Status: None (Preliminary result)   Collection Time: 07/29/22 12:20 PM   Specimen: Left Antecubital; Blood  Result Value Ref Range Status   Specimen Description LEFT ANTECUBITAL  Final   Special Requests   Final    BOTTLES DRAWN AEROBIC AND ANAEROBIC Blood Culture adequate volume   Culture   Final    NO GROWTH 2 DAYS Performed at [facility], [address], [place], [place] [address]    Report Status PENDING  Incomplete  Urine Culture (for pregnant, neutropenic or urologic patients or patients with an indwelling urinary catheter)     Status: Abnormal (Preliminary result)   Collection Time: 07/29/22  9:54 PM   Specimen: Urine, Clean Catch  Result Value Ref Range Status   Specimen Description   Final    URINE, CLEAN CATCH Performed at [facility], [address], [place], [place] [address]    Special Requests   Final    NONE Performed at [facility], [address], [place], [place] [address]    Culture (A)  Final    >=100,000 COLONIES/mL KLEBSIELLA PNEUMONIAE SUSCEPTIBILITIES TO FOLLOW Performed at [facility], [address] [place], [place] [address]    Report Status PENDING  Incomplete  MRSA Next Gen by PCR, Nasal     Status: Abnormal   Collection Time: 07/31/22  3:30 PM   Specimen: Nasal Mucosa; Nasal Swab  Result Value Ref Range Status   MRSA by PCR Next Gen DETECTED (A) NOT DETECTED Final    Comment:        The GeneXpert MRSA Assay (FDA approved for NASAL specimens only), is one component of a comprehensive MRSA colonization surveillance program. It is not intended to diagnose MRSA infection nor to guide or monitor treatment for MRSA infections. RESULT CALLED TO, READ BACK BY AND VERIFIED WITH:  ANGEL RAY 07/31/22 1748  NMN Performed at [facility], [address] [place], [place] [address]     Radiology Studies: No results found.  Scheduled Meds:  amLODipine  10 mg Oral Daily   carvedilol  6.25 mg Oral BID WC   Chlorhexidine Gluconate Cloth  6 each Topical Daily   [START ON 08/01/2022] Chlorhexidine Gluconate Cloth  6 each Topical Q0600   enoxaparin (LOVENOX) injection  0.5 mg/kg Subcutaneous Q24H   mupirocin ointment  1 Application Nasal BID   rosuvastatin  5 mg Oral Daily   Continuous Infusions:  cefTRIAXone (ROCEPHIN)  IV Stopped (07/30/22 2240)    LOS: 2 days   Shon Hale M.D on 07/31/2022 at 7:39 PM  Go to [email or web address] - for contact info  [facility] - Office  [phone]  If 7PM-7AM, please contact night-coverage [email or web address] 07/31/2022, 7:39 PM

## 2022-08-01 DIAGNOSIS — G9341 Metabolic encephalopathy: Secondary | ICD-10-CM | POA: Diagnosis not present

## 2022-08-01 LAB — CBC: MCHC: 32.5 g/dL (ref 30.0–36.0)

## 2022-08-01 MED ORDER — LIDOCAINE HCL URETHRAL/MUCOSAL 2 % EX GEL: 1.0000 | Freq: Once | CUTANEOUS | Status: DC

## 2022-08-01 MED ORDER — LIDOCAINE HCL URETHRAL/MUCOSAL 2 % EX GEL
1.0000 | Freq: Once | CUTANEOUS | Status: AC
  Administered 2022-08-01: 1 via URETHRAL
  Filled 2022-08-01: qty 5

## 2022-08-01 NOTE — Care Management Important Message (Signed)
Important Message  Patient Details  Name: Terrance Miller MRN: [id number] Date of Birth: 06/10/1928   Medicare Important Message Given:  Yes (copy left at bedside)     Corey Harold 08/01/2022, 10:32 AM

## 2022-08-01 NOTE — Progress Notes (Signed)
Physical Therapy Treatment Patient Details Name: Terrance Miller MRN: [id number] DOB: [birth year] Today's Date: 08/01/2022   History of Present Illness Terrance Miller is a 89 y.o. male with medical history significant for MGUS, diabetes mellitus, coronary artery disease, hypertension, aortic regurgitation.  Patient presented to the ED with complaints of generalized weakness over the past 5 days, reports that he cannot ambulate, and dark urine.  He has a chronic indwelling Foley catheter.  At the time of my evaluation, patient is awake alert able to answer simple questions, speech sometimes hard to understand.  He denies chest pain, no difficulty breathing.  He has chronic bilateral lower extremity swelling, he reports pain to his right ankle.    PT Comments   Pt sitting in chair with family members present and willing to participate with therapy.  Pt limited by weakness noted with increased time and mod A required for transfer training, including sit to stand and anterior/posterior scoots.  Pt presents with slow labored movements and required some cueing for hand placement to assist with scooting to EOB and sit to stand.  Pt able to ambulate 18ft with RW, slow labored movements with no LOB episodes.  Patient limited by fatigue. EOS pt left in chair with call bell within reach, LE reclined and family members present.   Patient would continue to benefit from skilled physical therapy in current environment and next venue to continue return to prior function and increase strength, endurance, balance, coordination, and functional mobility and gait skills.         Assistance Recommended at Discharge    If plan is discharge home, recommend the following:  Can travel by private vehicle           Equipment Recommendations       Recommendations for Other Services       Precautions / Restrictions Precautions Precautions: Fall Restrictions Weight Bearing Restrictions: No     Mobility  Bed  Mobility     General bed mobility comments: patient sitting in chair at beginning and end of session    Transfers Overall transfer level: Needs assistance Equipment used: Rolling walker (2 wheels) Transfers: Sit to/from Stand Sit to Stand: Mod assist   Step pivot transfers: Min guard   Anterior-Posterior transfers: Mod assist   General transfer comment: had difficulty completing sit to stands due to BLE weakness; mod A to complete anterior scoot to edge of chair for transferring    Ambulation/Gait Ambulation/Gait assistance: Min assist, Min guard Gait Distance (Feet): 25 Feet Assistive device: Rolling walker (2 wheels) Gait Pattern/deviations: Decreased step length - right, Decreased step length - left, Decreased stride length, Wide base of support Gait velocity: decreased     General Gait Details: slow labored cadence with RW without loss of balance and limited mostly due to c/o fatigue   Stairs             Wheelchair Mobility     Tilt Bed    Modified Rankin (Stroke Patients Only)       Balance Overall balance assessment: Needs assistance Sitting-balance support: Feet supported, No upper extremity supported Sitting balance-Leahy Scale: Fair Sitting balance - Comments: seated in chair   Standing balance support: Reliant on assistive device for balance, During functional activity, Bilateral upper extremity supported Standing balance-Leahy Scale: Fair Standing balance comment: using RW        Cognition Arousal/Alertness: Awake/alert Behavior During Therapy: WFL for tasks assessed/performed Overall Cognitive Status: Within Functional Limits for tasks assessed  Exercises      General Comments        Pertinent Vitals/Pain Pain Assessment Pain Assessment: PAINAD Breathing: normal Negative Vocalization: none Facial Expression: smiling or inexpressive Body Language: relaxed Consolability: no need to console PAINAD Score: 0 Pain  Intervention(s): Limited activity within patient's tolerance, Monitored during session    Home Living       Prior Function            PT Goals (current goals can now be found in the care plan section) Acute Rehab PT Goals Patient Stated Goal: return home with family to assist PT Goal Formulation: With patient Time For Goal Achievement: 08/13/22 Potential to Achieve Goals: Good Progress towards PT goals: Progressing toward goals    Frequency           PT Plan Current plan remains appropriate       AM-PAC PT "6 Clicks" Mobility   Outcome Measure  Help needed turning from your back to your side while in a flat bed without using bedrails?: A Little Help needed moving from lying on your back to sitting on the side of a flat bed without using bedrails?: A Lot Help needed moving to and from a bed to a chair (including a wheelchair)?: A Lot Help needed standing up from a chair using your arms (e.g., wheelchair or bedside chair)?: A Lot Help needed to walk in hospital room?: A Little Help needed climbing 3-5 steps with a railing? : A Lot 6 Click Score: 14    End of Session Equipment Utilized During Treatment: Gait belt Activity Tolerance: Patient tolerated treatment well;Patient limited by fatigue Patient left: in chair;with call bell/phone within reach;with family/visitor present Nurse Communication: Mobility status       Time: 1410-1433 PT Time Calculation (min) (ACUTE ONLY): 23 min  Charges:    $Therapeutic Activity: 23-37 mins PT General Charges $$ ACUTE PT VISIT: 1 Visit                     Katina Dung. Hartnett-Rands, MS, PT [facility] [place] [phone]  Britta Mccreedy  Hartnett-Rands 08/01/2022, 2:38 PM

## 2022-08-01 NOTE — Progress Notes (Signed)
Coude foley cath (#18 Fr) replaced per order. Pt tolerated well. Pt with large, thick skinned foreskin which made the urinary meatus difficult to visualize.

## 2022-08-01 NOTE — TOC Progression Note (Addendum)
LCSW called [employer] and they said it is going to peer-to-peer. Deadline is 10:00 AM Monday. Number to call is [phone], option 5. MD notified. PNC updated. Facility can accept tomorrow as long as auth received and d/c summary completed by 3:00.

## 2022-08-01 NOTE — Consult Note (Signed)
   [facility] Inpatient Consult   08/01/2022  Terrance Miller 1928/10/22 [id number]  Primary Care Provider:  Dr. Everlene Other [facility])  Patient is currently active with Care Management for chronic disease management services.  Patient has been engaged by a  care coordinator.  Our community based plan of care has focused on disease management and community resource support.   Patient will receive a post hospital call and will be evaluated for assessments and disease process education.   Plan: Pt will discharge to [facility] for STR.  Inpatient Transition Of Care [TOC] team member to make aware that Care Management following.  Of note, Care Management services does not replace or interfere with any services that are needed or arranged by inpatient [facility] care management team.   For additional questions or referrals please contact:  Elliot Cousin, RN, [facility] Liaison [place]   Population Health Office Hours MTWF  8:00 am-6:00 pm Off on Thursday [phone] mobile [phone] [Office toll free line] Office Hours are M-F 8:30 - 5 pm 24 hour nurse advise line [phone] Concierge  [email or web address]

## 2022-08-01 NOTE — Progress Notes (Signed)
Pt has been up OOB most of the day, ambulated in hallway with PT x1 and OOB to recliner with staff x2 & FWW. New foley catheter inserted with clear yellow urine returned, draining freely. Dressings changed to Stg 2 wounds on scrotum and buttocks. Pt has denied any need for pain medication today. Tolerated all meals well, feeds self. VSS.

## 2022-08-01 NOTE — Progress Notes (Signed)
PROGRESS NOTE   Terrance Miller, is a 89 y.o. male, DOB - 1928/12/22, ZOX:[id number]  Admit date - 07/29/2022   Admitting Physician Onnie Boer, MD  Outpatient Primary MD for the patient is [name], DO  LOS - 3  Chief Complaint  Patient presents with   Weakness    Pt complains of generalized weakness       Brief Narrative:  89 y.o. male with medical history significant for MGUS, diabetes mellitus, coronary artery disease, hypertension, aortic regurgitation admitted on 07/29/2022 with acute metabolic encephalopathy with concerns for UTI    -Assessment and Plan: 1) Acute metabolic encephalopathy Reported confusion, weakness unable to ambulate.  Head CT negative for acute abnormality, asymmetric soft tissue thickening around the right EAC.  Chronic indwelling Foley catheter.  Possible UTI with UA showing few bacteria greater than 50 WBCs.  Rules out for sepsis.  Lactic acid 1.3 > 1.6.  Chest x-ray poor inflation with bronchovascular crowding question tiny effusion.  Per outpatient notes 7/19-patient requiring more care than daughter is able to handle, seeking placement for patient. -Last urine culture on file 01/2022 grew MRSA, treated with vancomycin >> linezolid -IV ceftriaxone 2 g daily Patient has chronic indwelling Foley--POA -Consider changing Foley prior to discharge  2)Urinary Retention/Chronic Foley Catheter-- - He underwent bilateral hydrocelectomy by Dr. Pete Glatter in 11/2021 -The patient developed wound dehiscence which has been treated with dressing changes since that time. -He has had continued healing of his scrotal wound-- -  He has an indwelling Foley catheter--usually changed once a month at urology office--Foley changed on 08/01/2022 -He has limited mobility - Does have fecal incontinence-wears daper Having leakage around caude foley catheter---urology consult requested  Lower extremity edema 2 + Pitting bilateral lower extremity edema-per patient  chronic. With chronic stasis changes.  Per chart weight appears stable.  Chest x-ray -pulm inflation with bronchovascular crowding, question tiny effusions.  History of pulmonary hypertension.  Echo 09/2021 shows severely elevated pulmonary artery systolic pressure. RV systolic pressure of 62. He received IV Lasix -Appears chronic overall with venous stasis  Stage 3b chronic kidney disease (HCC) Cr 1.4, close to baseline 1-1.2. -- renally adjust medications, avoid nephrotoxic agents / dehydration  / hypotension   CAD (coronary artery disease) Denies chest pain.  EKG- unchanged.  Troponin 17 x 2.  Follows with cardiology.    Hypertension Blood pressure elevated up to 200/111. -Norvasc 5 mg, carvedilol 6.25 mg twice daily resumed -IV Hydralazine as needed 5 mg, systolic greater than 180  Chronic anemia--Hgb stable, no bleeding concerns  Generalized weakness and ambulatory dysfunction--- physical therapy eval appreciated recommends SNF rehab awaiting transfer to SNF rehab pending insurance authorization  Status is: Inpatient   Disposition: The patient is from: Home              Anticipated d/c is to: SNF              Anticipated d/c date is: 1 day              Patient currently is medically stable to d/c. Barriers: awaiting transfer to SNF rehab pending insurance authorization  Code Status :  -  Code Status: Full Code   Family Communication:     (patient is alert, awake and coherent)  -I called and updated patient's daughter Ms Terrance Miller by phone on 07/31/2022  DVT Prophylaxis  :   - SCDs     Lab Results  Component Value Date   PLT 132 (L)  08/01/2022   Inpatient Medications  Scheduled Meds:  amLODipine  10 mg Oral Daily   carvedilol  6.25 mg Oral BID WC   Chlorhexidine Gluconate Cloth  6 each Topical Daily   Chlorhexidine Gluconate Cloth  6 each Topical Q0600   enoxaparin (LOVENOX) injection  0.5 mg/kg Subcutaneous Q24H   mupirocin ointment  1 Application Nasal BID    rosuvastatin  5 mg Oral Daily   Continuous Infusions:  cefTRIAXone (ROCEPHIN)  IV 2 g (07/31/22 2110)   PRN Meds:.acetaminophen **OR** acetaminophen, hydrALAZINE, ondansetron **OR** ondansetron (ZOFRAN) IV, polyethylene glycol   Anti-infectives (From admission, onward)    Start     Dose/Rate Route Frequency Ordered Stop   07/29/22 2200  cefTRIAXone (ROCEPHIN) 2 g in sodium chloride 0.9 % 100 mL IVPB        2 g 200 mL/hr over 30 Minutes Intravenous Every 24 hours 07/29/22 2157     07/29/22 1715  ciprofloxacin (CIPRO) tablet 500 mg        500 mg Oral  Once 07/29/22 1713 07/29/22 1721       Subjective: [name] today has no fevers, no emesis,  No chest pain,   - -Tolerated change of Foley catheter well -Resting comfortably awaiting transfer to SNF rehab pending insurance authorization  Objective: Vitals:   07/31/22 2111 08/01/22 0337 08/01/22 0909 08/01/22 1836  BP: 138/63 136/89 (!) 158/94 (!) 153/75  Pulse: (!) 48 61 62 60  Resp: 20 16 17    Temp: (!) 97.5 F (36.4 C) (!) 97.5 F (36.4 C) 98.7 F (37.1 C) 98.1 F (36.7 C)  TempSrc: Oral  Oral Oral  SpO2: 100% 99% 99% 99%  Weight:      Height:        Intake/Output Summary (Last 24 hours) at 08/01/2022 1945 Last data filed at 08/01/2022 1925 Gross per 24 hour  Intake 1200 ml  Output 450 ml  Net 750 ml   Filed Weights   07/29/22 1209 07/30/22 0016  Weight: 94.8 kg 107.1 kg    Physical Exam  Gen:- Awake Alert, no acute distress HEENT:- [place].AT, No sclera icterus Neck-Supple Neck,No JVD,.  Lungs-  CTAB , fair symmetrical air movement CV- S1, S2 normal, regular  Abd-  +ve B.Sounds, Abd Soft, No tenderness,    Extremity/Skin:-Chronic venous stasis and discoloration of lower extremities, pedal pulses present  Psych-affect is appropriate, oriented x3 Neuro-generalized weakness, no new focal deficits, no tremors GU-chronic indwelling Foley--POA--Foley catheter changed out on 08/01/2022  Data Reviewed: I have  personally reviewed following labs and imaging studies  CBC: Recent Labs  Lab 07/29/22 1220 07/30/22 0308 08/01/22 0406  WBC 5.1 6.2 4.5  NEUTROABS 3.6  --   --   HGB 8.5* 9.0* 8.2*  HCT 26.4* 27.2* 25.2*  MCV 103.9* 103.8* 103.7*  PLT 142* 143* 132*   Basic Metabolic Panel: Recent Labs  Lab 07/29/22 1220 07/29/22 1422 07/30/22 0308 07/31/22 0426  NA 143  --  145 140  K 3.2*  --  3.3* 3.6  CL 115*  --  115* 114*  CO2 20*  --  22 21*  GLUCOSE 106*  --  102* 119*  BUN 19  --  19 22  CREATININE 1.42*  --  1.48* 1.44*  CALCIUM 8.4*  --  8.6* 8.3*  MG  --  2.2  --   --   PHOS  --   --   --  3.4   GFR: Estimated Creatinine Clearance: 39 mL/min (A) (  by C-G formula based on SCr of 1.44 mg/dL (H)). Liver Function Tests: Recent Labs  Lab 07/29/22 1220 07/31/22 0426  AST 23  --   ALT 16  --   ALKPHOS 50  --   BILITOT 2.0*  --   PROT 7.1  --   ALBUMIN 3.2* 3.0*   Recent Results (from the past 240 hour(s))  Resp panel by RT-PCR (RSV, Flu A&B, Covid) Anterior Nasal Swab     Status: None   Collection Time: 07/29/22 12:08 PM   Specimen: Anterior Nasal Swab  Result Value Ref Range Status   SARS Coronavirus 2 by RT PCR NEGATIVE NEGATIVE Final    Comment: (NOTE) SARS-CoV-2 target nucleic acids are NOT DETECTED.  The SARS-CoV-2 RNA is generally detectable in upper respiratory specimens during the acute phase of infection. The lowest concentration of SARS-CoV-2 viral copies this assay can detect is 138 copies/mL. A negative result does not preclude SARS-Cov-2 infection and should not be used as the sole basis for treatment or other patient management decisions. A negative result may occur with  improper specimen collection/handling, submission of specimen other than nasopharyngeal swab, presence of viral mutation(s) within the areas targeted by this assay, and inadequate number of viral copies(<138 copies/mL). A negative result must be combined with clinical observations,  patient history, and epidemiological information. The expected result is Negative.  Fact Sheet for Patients:  BloggerCourse.com  Fact Sheet for Healthcare Providers:  SeriousBroker.it  This test is no t yet approved or cleared by the [place] FDA and  has been authorized for detection and/or diagnosis of SARS-CoV-2 by FDA under an Emergency Use Authorization (EUA). This EUA will remain  in effect (meaning this test can be used) for the duration of the COVID-19 declaration under Section 564(b)(1) of the Act, 21 U.S.C.section 360bbb-3(b)(1), unless the authorization is terminated  or revoked sooner.       Influenza A by PCR NEGATIVE NEGATIVE Final   Influenza B by PCR NEGATIVE NEGATIVE Final    Comment: (NOTE) The Xpert Xpress SARS-CoV-2/FLU/RSV plus assay is intended as an aid in the diagnosis of influenza from Nasopharyngeal swab specimens and should not be used as a sole basis for treatment. Nasal washings and aspirates are unacceptable for Xpert Xpress SARS-CoV-2/FLU/RSV testing.  Fact Sheet for Patients: BloggerCourse.com  Fact Sheet for Healthcare Providers: SeriousBroker.it  This test is not yet approved or cleared by the [place] FDA and has been authorized for detection and/or diagnosis of SARS-CoV-2 by FDA under an Emergency Use Authorization (EUA). This EUA will remain in effect (meaning this test can be used) for the duration of the COVID-19 declaration under Section 564(b)(1) of the Act, 21 U.S.C. section 360bbb-3(b)(1), unless the authorization is terminated or revoked.     Resp Syncytial Virus by PCR NEGATIVE NEGATIVE Final    Comment: (NOTE) Fact Sheet for Patients: BloggerCourse.com  Fact Sheet for Healthcare Providers: SeriousBroker.it  This test is not yet approved or cleared by the [place] FDA and has been authorized for detection and/or diagnosis of SARS-CoV-2 by FDA under an Emergency Use Authorization (EUA). This EUA will remain in effect (meaning this test can be used) for the duration of the COVID-19 declaration under Section 564(b)(1) of the Act, 21 U.S.C. section 360bbb-3(b)(1), unless the authorization is terminated or revoked.  Performed at [facility], [address] [place], [place] [address]   Blood Culture (routine x 2)     Status: Abnormal   Collection Time: 07/29/22 12:18  PM   Specimen: BLOOD RIGHT FOREARM  Result Value Ref Range Status   Specimen Description   Final    BLOOD RIGHT FOREARM Performed at [facility], [address] [place], [place] [address]    Special Requests   Final    BOTTLES DRAWN AEROBIC ONLY Blood Culture results may not be optimal due to an inadequate volume of blood received in culture bottles Performed at [facility], [address], [place], [place] [address]    Culture  Setup Time   Final    GRAM POSITIVE COCCI AEROBIC BOTTLE ONLY CRITICAL RESULT CALLED TO, READ BACK BY AND VERIFIED WITH: D JONES AT 0405 07/31/2022 BY T KENNEDY CRITICAL RESULT CALLED TO, READ BACK BY AND VERIFIED WITH: PHARM D L.POOLE AT 1039 ON 07/31/2022 BY T.SAAD.    Culture (A)  Final    STAPHYLOCOCCUS EPIDERMIDIS THE SIGNIFICANCE OF ISOLATING THIS ORGANISM FROM A SINGLE SET OF BLOOD CULTURES WHEN MULTIPLE SETS ARE DRAWN IS UNCERTAIN. PLEASE NOTIFY THE MICROBIOLOGY DEPARTMENT WITHIN ONE WEEK IF SPECIATION AND SENSITIVITIES ARE REQUIRED. Performed at [facility], [address], [place], [place] [address]    Report Status 08/01/2022 FINAL  Final  Blood Culture ID Panel (Reflexed)     Status: Abnormal   Collection Time: 07/29/22 12:18 PM  Result Value Ref Range Status   Enterococcus faecalis NOT DETECTED NOT DETECTED Final   Enterococcus Faecium NOT DETECTED NOT DETECTED Final   Listeria monocytogenes NOT DETECTED NOT DETECTED Final    Staphylococcus species DETECTED (A) NOT DETECTED Final    Comment: CRITICAL RESULT CALLED TO, READ BACK BY AND VERIFIED WITH: PHARM D L.POOLE AT 1039 ON 07/31/2022 BY T.SAAD.    Staphylococcus aureus (BCID) NOT DETECTED NOT DETECTED Final   Staphylococcus epidermidis DETECTED (A) NOT DETECTED Final    Comment: Methicillin (oxacillin) resistant coagulase negative staphylococcus. Possible blood culture contaminant (unless isolated from more than one blood culture draw or clinical case suggests pathogenicity). No antibiotic treatment is indicated for blood  culture contaminants. CRITICAL RESULT CALLED TO, READ BACK BY AND VERIFIED WITH: PHARM D L.POOLE AT 1039 ON 07/31/2022 BY T.SAAD.    Staphylococcus lugdunensis NOT DETECTED NOT DETECTED Final   Streptococcus species NOT DETECTED NOT DETECTED Final   Streptococcus agalactiae NOT DETECTED NOT DETECTED Final   Streptococcus pneumoniae NOT DETECTED NOT DETECTED Final   Streptococcus pyogenes NOT DETECTED NOT DETECTED Final   A.calcoaceticus-baumannii NOT DETECTED NOT DETECTED Final   Bacteroides fragilis NOT DETECTED NOT DETECTED Final   Enterobacterales NOT DETECTED NOT DETECTED Final   Enterobacter cloacae complex NOT DETECTED NOT DETECTED Final   Escherichia coli NOT DETECTED NOT DETECTED Final   Klebsiella aerogenes NOT DETECTED NOT DETECTED Final   Klebsiella oxytoca NOT DETECTED NOT DETECTED Final   Klebsiella pneumoniae NOT DETECTED NOT DETECTED Final   Proteus species NOT DETECTED NOT DETECTED Final   Salmonella species NOT DETECTED NOT DETECTED Final   Serratia marcescens NOT DETECTED NOT DETECTED Final   Haemophilus influenzae NOT DETECTED NOT DETECTED Final   Neisseria meningitidis NOT DETECTED NOT DETECTED Final   Pseudomonas aeruginosa NOT DETECTED NOT DETECTED Final   Stenotrophomonas maltophilia NOT DETECTED NOT DETECTED Final   Candida albicans NOT DETECTED NOT DETECTED Final   Candida auris NOT DETECTED NOT DETECTED Final    Candida glabrata NOT DETECTED NOT DETECTED Final   Candida krusei NOT DETECTED NOT DETECTED Final   Candida parapsilosis NOT DETECTED NOT DETECTED Final   Candida tropicalis NOT DETECTED NOT  DETECTED Final   Cryptococcus neoformans/gattii NOT DETECTED NOT DETECTED Final   Methicillin resistance mecA/C DETECTED (A) NOT DETECTED Final    Comment: CRITICAL RESULT CALLED TO, READ BACK BY AND VERIFIED WITH: PHARM D L.POOLE AT 1039 ON 07/31/2022 BY T.SAAD. Performed at [facility], [address] [place], [place] [address]   Blood Culture (routine x 2)     Status: None (Preliminary result)   Collection Time: 07/29/22 12:20 PM   Specimen: Left Antecubital; Blood  Result Value Ref Range Status   Specimen Description LEFT ANTECUBITAL  Final   Special Requests   Final    BOTTLES DRAWN AEROBIC AND ANAEROBIC Blood Culture adequate volume   Culture   Final    NO GROWTH 3 DAYS Performed at [facility], [address] [place], [place] [address]    Report Status PENDING  Incomplete  Urine Culture (for pregnant, neutropenic or urologic patients or patients with an indwelling urinary catheter)     Status: Abnormal   Collection Time: 07/29/22  9:54 PM   Specimen: Urine, Clean Catch  Result Value Ref Range Status   Specimen Description   Final    URINE, CLEAN CATCH Performed at [facility], [address], [place], [place] [address]    Special Requests   Final    NONE Performed at [facility], [address] [place], [place] [address]    Culture >=100,000 COLONIES/mL KLEBSIELLA PNEUMONIAE (A)  Final   Report Status 08/01/2022 FINAL  Final   Organism ID, Bacteria KLEBSIELLA PNEUMONIAE (A)  Final      Susceptibility   Klebsiella pneumoniae - MIC*    AMPICILLIN >=32 RESISTANT Resistant     CEFAZOLIN <=4 SENSITIVE Sensitive     CEFEPIME <=0.12 SENSITIVE Sensitive     CEFTRIAXONE <=0.25 SENSITIVE Sensitive     CIPROFLOXACIN <=0.25 SENSITIVE Sensitive     GENTAMICIN <=1 SENSITIVE  Sensitive     IMIPENEM <=0.25 SENSITIVE Sensitive     NITROFURANTOIN 64 INTERMEDIATE Intermediate     TRIMETH/SULFA >=320 RESISTANT Resistant     AMPICILLIN/SULBACTAM 4 SENSITIVE Sensitive     PIP/TAZO <=4 SENSITIVE Sensitive     * >=100,000 COLONIES/mL KLEBSIELLA PNEUMONIAE  MRSA Next Gen by PCR, Nasal     Status: Abnormal   Collection Time: 07/31/22  3:30 PM   Specimen: Nasal Mucosa; Nasal Swab  Result Value Ref Range Status   MRSA by PCR Next Gen DETECTED (A) NOT DETECTED Final    Comment:        The GeneXpert MRSA Assay (FDA approved for NASAL specimens only), is one component of a comprehensive MRSA colonization surveillance program. It is not intended to diagnose MRSA infection nor to guide or monitor treatment for MRSA infections. RESULT CALLED TO, READ BACK BY AND VERIFIED WITH:  ANGEL RAY 07/31/22 1748 NMN Performed at [facility], [address], [place], [place] [address]     Radiology Studies: No results found.  Scheduled Meds:  amLODipine  10 mg Oral Daily   carvedilol  6.25 mg Oral BID WC   Chlorhexidine Gluconate Cloth  6 each Topical Daily   Chlorhexidine Gluconate Cloth  6 each Topical Q0600   enoxaparin (LOVENOX) injection  0.5 mg/kg Subcutaneous Q24H   mupirocin ointment  1 Application Nasal BID   rosuvastatin  5 mg Oral Daily   Continuous Infusions:  cefTRIAXone (ROCEPHIN)  IV 2 g (07/31/22 2110)    LOS: 3 days   Shon Hale M.D on 08/01/2022 at 7:45  PM  Go to [email or web address] - for contact info  [facility] - Office  [phone]  If 7PM-7AM, please contact night-coverage [email or web address] 08/01/2022, 7:45 PM

## 2022-08-02 DIAGNOSIS — N401 Enlarged prostate with lower urinary tract symptoms: Secondary | ICD-10-CM | POA: Diagnosis not present

## 2022-08-02 DIAGNOSIS — F0390 Unspecified dementia without behavioral disturbance: Secondary | ICD-10-CM | POA: Diagnosis not present

## 2022-08-02 DIAGNOSIS — K5909 Other constipation: Secondary | ICD-10-CM | POA: Diagnosis not present

## 2022-08-02 DIAGNOSIS — I2510 Atherosclerotic heart disease of native coronary artery without angina pectoris: Secondary | ICD-10-CM | POA: Diagnosis not present

## 2022-08-02 DIAGNOSIS — R262 Difficulty in walking, not elsewhere classified: Secondary | ICD-10-CM | POA: Diagnosis not present

## 2022-08-02 DIAGNOSIS — M6281 Muscle weakness (generalized): Secondary | ICD-10-CM | POA: Diagnosis not present

## 2022-08-02 DIAGNOSIS — E785 Hyperlipidemia, unspecified: Secondary | ICD-10-CM | POA: Diagnosis not present

## 2022-08-02 DIAGNOSIS — Z959 Presence of cardiac and vascular implant and graft, unspecified: Secondary | ICD-10-CM | POA: Diagnosis not present

## 2022-08-02 DIAGNOSIS — D631 Anemia in chronic kidney disease: Secondary | ICD-10-CM | POA: Diagnosis not present

## 2022-08-02 DIAGNOSIS — R488 Other symbolic dysfunctions: Secondary | ICD-10-CM | POA: Diagnosis not present

## 2022-08-02 DIAGNOSIS — D472 Monoclonal gammopathy: Secondary | ICD-10-CM | POA: Diagnosis not present

## 2022-08-02 DIAGNOSIS — N390 Urinary tract infection, site not specified: Secondary | ICD-10-CM | POA: Diagnosis not present

## 2022-08-02 DIAGNOSIS — E1129 Type 2 diabetes mellitus with other diabetic kidney complication: Secondary | ICD-10-CM | POA: Diagnosis not present

## 2022-08-02 DIAGNOSIS — R600 Localized edema: Secondary | ICD-10-CM | POA: Diagnosis not present

## 2022-08-02 DIAGNOSIS — E1159 Type 2 diabetes mellitus with other circulatory complications: Secondary | ICD-10-CM | POA: Diagnosis not present

## 2022-08-02 DIAGNOSIS — I152 Hypertension secondary to endocrine disorders: Secondary | ICD-10-CM | POA: Diagnosis not present

## 2022-08-02 DIAGNOSIS — G9341 Metabolic encephalopathy: Secondary | ICD-10-CM | POA: Diagnosis not present

## 2022-08-02 DIAGNOSIS — I700 Atherosclerosis of aorta: Secondary | ICD-10-CM | POA: Diagnosis not present

## 2022-08-02 DIAGNOSIS — N1832 Chronic kidney disease, stage 3b: Secondary | ICD-10-CM | POA: Diagnosis not present

## 2022-08-02 DIAGNOSIS — B9689 Other specified bacterial agents as the cause of diseases classified elsewhere: Secondary | ICD-10-CM | POA: Diagnosis not present

## 2022-08-02 DIAGNOSIS — I129 Hypertensive chronic kidney disease with stage 1 through stage 4 chronic kidney disease, or unspecified chronic kidney disease: Secondary | ICD-10-CM | POA: Diagnosis not present

## 2022-08-02 DIAGNOSIS — N183 Chronic kidney disease, stage 3 unspecified: Secondary | ICD-10-CM | POA: Diagnosis not present

## 2022-08-02 DIAGNOSIS — Z978 Presence of other specified devices: Secondary | ICD-10-CM | POA: Diagnosis not present

## 2022-08-02 DIAGNOSIS — F0150 Vascular dementia without behavioral disturbance: Secondary | ICD-10-CM | POA: Diagnosis not present

## 2022-08-02 DIAGNOSIS — L853 Xerosis cutis: Secondary | ICD-10-CM | POA: Diagnosis not present

## 2022-08-02 DIAGNOSIS — D539 Nutritional anemia, unspecified: Secondary | ICD-10-CM | POA: Diagnosis not present

## 2022-08-02 DIAGNOSIS — H40053 Ocular hypertension, bilateral: Secondary | ICD-10-CM | POA: Diagnosis not present

## 2022-08-02 DIAGNOSIS — I10 Essential (primary) hypertension: Secondary | ICD-10-CM | POA: Diagnosis not present

## 2022-08-02 MED ORDER — CARVEDILOL 6.25 MG PO TABS: 6.2500 mg | ORAL_TABLET | Freq: Two times a day (BID) | ORAL | 3 refills | Status: DC

## 2022-08-02 MED ORDER — POTASSIUM CHLORIDE ER 10 MEQ PO TBCR: 10.0000 meq | EXTENDED_RELEASE_TABLET | Freq: Every day | ORAL | 0 refills | Status: DC

## 2022-08-02 MED ORDER — AMLODIPINE BESYLATE 10 MG PO TABS: 10.0000 mg | ORAL_TABLET | Freq: Every day | ORAL | 5 refills | Status: DC

## 2022-08-02 MED ORDER — ASPIRIN 81 MG PO TBEC: 81.0000 mg | DELAYED_RELEASE_TABLET | Freq: Every day | ORAL | 3 refills | Status: DC

## 2022-08-02 MED ORDER — CEPHALEXIN 500 MG PO CAPS: 500.0000 mg | ORAL_CAPSULE | Freq: Three times a day (TID) | ORAL | 0 refills | Status: AC

## 2022-08-02 MED ORDER — FUROSEMIDE 20 MG PO TABS: 20.0000 mg | ORAL_TABLET | Freq: Every day | ORAL | 1 refills | Status: DC

## 2022-08-02 MED ORDER — ACETAMINOPHEN 325 MG PO TABS: 650.0000 mg | ORAL_TABLET | Freq: Four times a day (QID) | ORAL | Status: DC | PRN

## 2022-08-02 NOTE — Progress Notes (Signed)
Pt assisted up OOB by staff x2. Pt ambulated in hallway with FWW and standby assist, pt walked 168ft in total and tolerated well. Pt currently up in recliner watching TV. Denies any c/o. Have spoken with pt's daughter via phone this am, she is aware of pending discharge to SNF.

## 2022-08-02 NOTE — Plan of Care (Signed)

## 2022-08-02 NOTE — Discharge Summary (Signed)
Terrance Miller, is a 89 y.o. male  DOB [birth year]  MRN [id number].  Admission date:  07/29/2022  Admitting Physician  Onnie Boer, MD  Discharge Date:  08/02/2022   Primary MD  [name], DO  Recommendations for primary care physician for things to follow:  1)Please follow-up with Urologist Dr. Dayton Martes  every month for Foley catheter change  in his office----[facility], [address], [address], [place] [address] Phone Number----[phone]  2) repeat CBC and BMP blood test in about 5 days advised  Admission Diagnosis  Weakness [R53.1] Acute metabolic encephalopathy [G93.41] Urinary tract infection associated with indwelling urethral catheter, initial encounter (HCC) [I69.629B, N39.0]   Discharge Diagnosis  Weakness [R53.1] Acute metabolic encephalopathy [G93.41] Urinary tract infection associated with indwelling urethral catheter, initial encounter (HCC) [M84.132G, N39.0]    Principal Problem:   Acute metabolic encephalopathy Active Problems:   Lower extremity edema   Chronic indwelling Foley catheter   Hypertension   CAD (coronary artery disease)   Stage 3b chronic kidney disease (HCC)   MGUS (monoclonal gammopathy of unknown significance)   Dry skin dermatitis      Past Medical History:  Diagnosis Date   Acute ST elevation myocardial infarction (STEMI) of inferior wall (HCC) 2004   Coronary artery disease    DES x2 to the RCA 2004, residual disease managed medically   Hyperlipidemia    Hypertension    Type 2 diabetes mellitus (HCC)     Past Surgical History:  Procedure Laterality Date   BIOPSY  01/28/2022   Procedure: BIOPSY;  Surgeon: Marguerita Merles, Reuel Boom, MD;  [facility]: [facility];  Service: Gastroenterology;;   CATARACT EXTRACTION Bilateral    CORONARY ANGIOPLASTY WITH STENT PLACEMENT  09/05/2002   stent RCA, 80% first diagonal, 70-80%  mid-diagonal, 80% mid LAD stenosis   FLEXIBLE SIGMOIDOSCOPY N/A 01/28/2022   Procedure: FLEXIBLE SIGMOIDOSCOPY;  Surgeon: Dolores Frame, MD;  [facility]: [facility];  Service: Gastroenterology;  Laterality: N/A;   HYDROCELE EXCISION Bilateral 11/20/2021   Procedure: HYDROCELECTOMY ADULT;  Surgeon: Milderd Meager., MD;  Location: [facility];  Service: Urology;  Laterality: Bilateral;   IMPACTION REMOVAL  01/28/2022   Procedure: IMPACTION REMOVAL;  Surgeon: Dolores Frame, MD;  [facility]: [facility];  Service: Gastroenterology;;   NM MYOCAR PERF WALL MOTION  11/01/2008   Normal     HPI  from the history and physical done on the day of admission:  Chief Complaint: Confusion   HPI: Terrance Miller is a 89 y.o. male with medical history significant for MGUS, diabetes mellitus, coronary artery disease, hypertension, aortic regurgitation. Patient presented to the ED with complaints of generalized weakness over the past 5 days, reports that he cannot ambulate, and dark urine.  He has a chronic indwelling Foley catheter. At the time of my evaluation, patient is awake alert able to answer simple questions, speech sometimes hard to understand.  He denies chest pain, no difficulty breathing.  He has chronic bilateral lower extremity swelling, he reports  pain to his right ankle.   ED Course: Tmax 98.8.  Heart rate 50s to 60s.  Respiratory rate 14-24.  Blood pressure systolic elevated up to 200/111.  O2 sats greater than 94% on room air. WBC 5.1.  Lactic acid 1.3.  UA with few bacteria greater than 50 WBCs. Cipro 500 mg x 1 given with initial plan to send patient home, but patient unable to ambulate, hence hospitalization requested.   Review of Systems: As per HPI all other systems reviewed and negative.   Hospital Course:   Brief Narrative:  89 y.o. male with medical history significant for MGUS, diabetes mellitus, coronary artery disease, hypertension, aortic regurgitation  admitted on 07/29/2022 with acute metabolic encephalopathy with concerns for UTI     -Assessment and Plan: 1) Acute metabolic encephalopathy--- most likely due to Klebsiella UTI Reported confusion, weakness unable to ambulate.  Head CT negative for acute abnormality, asymmetric soft tissue thickening around the right EAC.  Chronic indwelling Foley catheter.   -Chest x-ray without infectious findings  -Last urine culture on file 01/2022 grew MRSA, treated with vancomycin >> linezolid -Urine culture from 07/29/2022 with Klebsiella -Treated with IV ceftriaxone , okay to discharge on Keflex Patient has chronic indwelling Foley--POA Coude Foley exchanged on 07/31/2025 -Patient is back to baseline from a  mentation and cognitive standpoint   2)Urinary Retention/Chronic Foley Catheter/CAUTI---POA - He underwent bilateral hydrocelectomy by Dr. Pete Glatter in 11/2021 -The patient developed wound dehiscence which has been treated with dressing changes since that time. -He has had continued healing of his scrotal wound-- -  He has an indwelling Foley catheter--usually changed once a month at urology office--Coude Foley changed on 08/01/2022 -He has limited mobility - Does have fecal incontinence-wears daper -Discussed with Dr. Ronne Binning from general neurology -Recommend monthly follow-up as outpatient with urologist for Foley change   3)Lower extremity edema 2 + Pitting bilateral lower extremity edema-per patient chronic. With chronic stasis changes.  Per chart weight appears stable.  Marland Kitchen  History of pulmonary hypertension.  Echo 09/2021 shows severely elevated pulmonary artery systolic pressure. RV systolic pressure of 62. -Appears chronic overall with venous stasis -Discharge on oral Lasix and potassium supplementation   4)Stage 3b chronic kidney disease (HCC) Cr 1.4, close to baseline 1-1.2. -- renally adjust medications, avoid nephrotoxic agents / dehydration  / hypotension  -Repeat BMP in about 5  days   5)CAD (coronary artery disease) -No Chest pains -Ruled out for ACS with cardiac enzymes and EKG -Continue aspirin, Crestor and Coreg   6)Hypertension -Stable, discharge on amlodipine and Coreg   Chronic anemia--Hgb stable, no bleeding concerns -Repeat CBC in about 5 days   Generalized weakness and ambulatory dysfunction--- physical therapy eval appreciated recommends SNF rehab   Disposition: The patient is from: Home              Anticipated d/c is to: SNF  Discharge Condition: stable  Follow UP   Contact information for after-discharge care     Destination     [facility] Preferred SNF .   Service: Skilled Nursing Contact information: [address] [place] [address] [phone]                     Consults obtained - Discussed with Urologist Dr. Ronne Binning  Diet and Activity recommendation:  As advised  Discharge Instructions    Discharge Instructions     Call MD for:  difficulty breathing, headache or visual disturbances   Complete by:  As directed    Call MD for:  persistant dizziness or light-headedness   Complete by: As directed    Call MD for:  persistant nausea and vomiting   Complete by: As directed    Call MD for:  temperature >100.4   Complete by: As directed    Diet - low sodium heart healthy   Complete by: As directed    Discharge instructions   Complete by: As directed    1)Please follow-up with Urologist Dr. Dayton Martes  every month for Foley catheter change  in his office----[facility], [address], [address], [place] [address] Phone Number----[phone]  2) repeat CBC and BMP blood test in about 5 days advised   Discharge wound care:   Complete by: As directed    Wound care as above   Increase activity slowly   Complete by: As directed        Discharge Medications     Allergies as of 08/02/2022   No Known Allergies      Medication List     STOP taking these  medications    hydrocortisone 25 MG suppository Commonly known as: ANUSOL-HC   linezolid 600 MG tablet Commonly known as: ZYVOX       TAKE these medications    acetaminophen 325 MG tablet Commonly known as: TYLENOL Take 2 tablets (650 mg total) by mouth every 6 (six) hours as needed for mild pain (or Fever >/= 101).   amLODipine 10 MG tablet Commonly known as: NORVASC Take 1 tablet (10 mg total) by mouth daily. Start taking on: August 03, 2022 What changed:  medication strength how much to take   aspirin EC 81 MG tablet Take 1 tablet (81 mg total) by mouth daily with breakfast. Swallow whole.   bisacodyl 10 MG suppository Commonly known as: DULCOLAX Place 1 suppository (10 mg total) rectally every Monday, Wednesday, and Friday.   carvedilol 6.25 MG tablet Commonly known as: COREG Take 1 tablet (6.25 mg total) by mouth 2 (two) times daily.   cephALEXin 500 MG capsule Commonly known as: Keflex Take 1 capsule (500 mg total) by mouth 3 (three) times daily for 5 days.   cholecalciferol 1000 units tablet Commonly known as: VITAMIN D Take 1,000 Units by mouth daily.   clobetasol ointment 0.05 % Commonly known as: TEMOVATE APPLY TO AFFECTED AREA ONCE DAILY. What changed: See the new instructions.   cyanocobalamin 1000 MCG tablet Commonly known as: VITAMIN B12 Take 1,000 mcg by mouth daily.   furosemide 20 MG tablet Commonly known as: Lasix Take 1 tablet (20 mg total) by mouth daily.   polyethylene glycol 17 g packet Commonly known as: MIRALAX / GLYCOLAX Take 17 g by mouth 2 (two) times daily.   potassium chloride 10 MEQ tablet Commonly known as: KLOR-CON Take 1 tablet (10 mEq total) by mouth daily. Take While taking Lasix/furosemide   rosuvastatin 5 MG tablet Commonly known as: CRESTOR Take 1 tablet (5 mg total) by mouth daily.   tamsulosin 0.4 MG Caps capsule Commonly known as: FLOMAX Take 0.4 mg by mouth daily.   Travatan Z 0.004 % Soln ophthalmic  solution Generic drug: Travoprost (BAK Free) Place 1 drop into both eyes 2 (two) times daily.               Discharge Care Instructions  (From admission, onward)           Start     Ordered   08/02/22 0000  Discharge  wound care:       Comments: Wound care as above   08/02/22 1331           Major procedures and Radiology Reports - PLEASE review detailed and final reports for all details, in brief -   CT Head Wo Contrast  Result Date: 07/29/2022 CLINICAL DATA:  Mental status change, unknown cause EXAM: CT HEAD WITHOUT CONTRAST TECHNIQUE: Contiguous axial images were obtained from the base of the skull through the vertex without intravenous contrast. RADIATION DOSE REDUCTION: This exam was performed according to the departmental dose-optimization program which includes automated exposure control, adjustment of the mA and/or kV according to patient size and/or use of iterative reconstruction technique. COMPARISON:  CT Head 11/30/21 FINDINGS: Brain: No evidence of acute infarction, hemorrhage, hydrocephalus, extra-axial collection or mass lesion/mass effect. Sequela of mild chronic microvascular ischemic change. Generalized volume loss. Vascular: No hyperdense vessel or unexpected calcification. Skull: Negative for fracture or focal lesion. There is asymmetric soft thickening surrounding the right EAC. Correlate with physical exam to exclude the possibility of infection. Sinuses/Orbits: No middle ear or mastoid effusion. Paranasal sinuses are notable for mucosal thickening in the right maxillary and right sphenoid sinus with osseous changes suggestive chronic right maxillary sinusitis. Orbits are notable for bilateral lens replacement, otherwise unremarkable. Other: None. IMPRESSION: 1. No CT finding to explain altered mental status. 2. Asymmetric soft tissue thickening surrounding the right EAC. Correlate with physical exam to exclude the possibility of infection. Electronically Signed    By: Lorenza Cambridge M.D.   On: 07/29/2022 14:11   DG Chest Port 1 View  Result Date: 07/29/2022 CLINICAL DATA:  Sepsis. EXAM: PORTABLE CHEST 1 VIEW COMPARISON:  01/27/2022 FINDINGS: Underinflation. Stable cardiopericardial silhouette. Tiny effusions. Interstitial changes. No pneumothorax. Apical pleural thickening. Overlapping cardiac leads. Degenerative changes are seen of the spine. Calcified aorta. [place] overlying the left hemithorax. IMPRESSION: Poor inflation with bronchovascular crowding. Question tiny effusions. Recommend follow up x-ray with improved inflation when clinically appropriate Electronically Signed   By: Karen Kays M.D.   On: 07/29/2022 13:32    Micro Results   Recent Results (from the past 240 hour(s))  Resp panel by RT-PCR (RSV, Flu A&B, Covid) Anterior Nasal Swab     Status: None   Collection Time: 07/29/22 12:08 PM   Specimen: Anterior Nasal Swab  Result Value Ref Range Status   SARS Coronavirus 2 by RT PCR NEGATIVE NEGATIVE Final    Comment: (NOTE) SARS-CoV-2 target nucleic acids are NOT DETECTED.  The SARS-CoV-2 RNA is generally detectable in upper respiratory specimens during the acute phase of infection. The lowest concentration of SARS-CoV-2 viral copies this assay can detect is 138 copies/mL. A negative result does not preclude SARS-Cov-2 infection and should not be used as the sole basis for treatment or other patient management decisions. A negative result may occur with  improper specimen collection/handling, submission of specimen other than nasopharyngeal swab, presence of viral mutation(s) within the areas targeted by this assay, and inadequate number of viral copies(<138 copies/mL). A negative result must be combined with clinical observations, patient history, and epidemiological information. The expected result is Negative.  Fact Sheet for Patients:  BloggerCourse.com  Fact Sheet for Healthcare Providers:   SeriousBroker.it  This test is no t yet approved or cleared by the [place] FDA and  has been authorized for detection and/or diagnosis of SARS-CoV-2 by FDA under an Emergency Use Authorization (EUA). This EUA will remain  in effect (meaning this test can be  used) for the duration of the COVID-19 declaration under Section 564(b)(1) of the Act, 21 U.S.C.section 360bbb-3(b)(1), unless the authorization is terminated  or revoked sooner.       Influenza A by PCR NEGATIVE NEGATIVE Final   Influenza B by PCR NEGATIVE NEGATIVE Final    Comment: (NOTE) The Xpert Xpress SARS-CoV-2/FLU/RSV plus assay is intended as an aid in the diagnosis of influenza from Nasopharyngeal swab specimens and should not be used as a sole basis for treatment. Nasal washings and aspirates are unacceptable for Xpert Xpress SARS-CoV-2/FLU/RSV testing.  Fact Sheet for Patients: BloggerCourse.com  Fact Sheet for Healthcare Providers: SeriousBroker.it  This test is not yet approved or cleared by the [place] FDA and has been authorized for detection and/or diagnosis of SARS-CoV-2 by FDA under an Emergency Use Authorization (EUA). This EUA will remain in effect (meaning this test can be used) for the duration of the COVID-19 declaration under Section 564(b)(1) of the Act, 21 U.S.C. section 360bbb-3(b)(1), unless the authorization is terminated or revoked.     Resp Syncytial Virus by PCR NEGATIVE NEGATIVE Final    Comment: (NOTE) Fact Sheet for Patients: BloggerCourse.com  Fact Sheet for Healthcare Providers: SeriousBroker.it  This test is not yet approved or cleared by the [place] FDA and has been authorized for detection and/or diagnosis of SARS-CoV-2 by FDA under an Emergency Use Authorization (EUA). This EUA will remain in effect (meaning this test can be used) for  the duration of the COVID-19 declaration under Section 564(b)(1) of the Act, 21 U.S.C. section 360bbb-3(b)(1), unless the authorization is terminated or revoked.  Performed at [facility], [address] [place], [place] [address]   Blood Culture (routine x 2)     Status: Abnormal   Collection Time: 07/29/22 12:18 PM   Specimen: BLOOD RIGHT FOREARM  Result Value Ref Range Status   Specimen Description   Final    BLOOD RIGHT FOREARM Performed at [facility], [address], [place], [place] [address]    Special Requests   Final    BOTTLES DRAWN AEROBIC ONLY Blood Culture results may not be optimal due to an inadequate volume of blood received in culture bottles Performed at [facility], [address] [place], [place] [address]    Culture  Setup Time   Final    GRAM POSITIVE COCCI AEROBIC BOTTLE ONLY CRITICAL RESULT CALLED TO, READ BACK BY AND VERIFIED WITH: D JONES AT 0405 07/31/2022 BY T KENNEDY CRITICAL RESULT CALLED TO, READ BACK BY AND VERIFIED WITH: PHARM D L.POOLE AT 1039 ON 07/31/2022 BY T.SAAD.    Culture (A)  Final    STAPHYLOCOCCUS EPIDERMIDIS THE SIGNIFICANCE OF ISOLATING THIS ORGANISM FROM A SINGLE SET OF BLOOD CULTURES WHEN MULTIPLE SETS ARE DRAWN IS UNCERTAIN. PLEASE NOTIFY THE MICROBIOLOGY DEPARTMENT WITHIN ONE WEEK IF SPECIATION AND SENSITIVITIES ARE REQUIRED. Performed at [facility], [address], [place], [place] [address]    Report Status 08/01/2022 FINAL  Final  Blood Culture ID Panel (Reflexed)     Status: Abnormal   Collection Time: 07/29/22 12:18 PM  Result Value Ref Range Status   Enterococcus faecalis NOT DETECTED NOT DETECTED Final   Enterococcus Faecium NOT DETECTED NOT DETECTED Final   Listeria monocytogenes NOT DETECTED NOT DETECTED Final   Staphylococcus species DETECTED (A) NOT DETECTED Final    Comment: CRITICAL RESULT CALLED TO, READ BACK BY AND VERIFIED WITH: PHARM D L.POOLE AT 1039 ON 07/31/2022 BY T.SAAD.    Staphylococcus  aureus (  BCID) NOT DETECTED NOT DETECTED Final   Staphylococcus epidermidis DETECTED (A) NOT DETECTED Final    Comment: Methicillin (oxacillin) resistant coagulase negative staphylococcus. Possible blood culture contaminant (unless isolated from more than one blood culture draw or clinical case suggests pathogenicity). No antibiotic treatment is indicated for blood  culture contaminants. CRITICAL RESULT CALLED TO, READ BACK BY AND VERIFIED WITH: PHARM D L.POOLE AT 1039 ON 07/31/2022 BY T.SAAD.    Staphylococcus lugdunensis NOT DETECTED NOT DETECTED Final   Streptococcus species NOT DETECTED NOT DETECTED Final   Streptococcus agalactiae NOT DETECTED NOT DETECTED Final   Streptococcus pneumoniae NOT DETECTED NOT DETECTED Final   Streptococcus pyogenes NOT DETECTED NOT DETECTED Final   A.calcoaceticus-baumannii NOT DETECTED NOT DETECTED Final   Bacteroides fragilis NOT DETECTED NOT DETECTED Final   Enterobacterales NOT DETECTED NOT DETECTED Final   Enterobacter cloacae complex NOT DETECTED NOT DETECTED Final   Escherichia coli NOT DETECTED NOT DETECTED Final   Klebsiella aerogenes NOT DETECTED NOT DETECTED Final   Klebsiella oxytoca NOT DETECTED NOT DETECTED Final   Klebsiella pneumoniae NOT DETECTED NOT DETECTED Final   Proteus species NOT DETECTED NOT DETECTED Final   Salmonella species NOT DETECTED NOT DETECTED Final   Serratia marcescens NOT DETECTED NOT DETECTED Final   Haemophilus influenzae NOT DETECTED NOT DETECTED Final   Neisseria meningitidis NOT DETECTED NOT DETECTED Final   Pseudomonas aeruginosa NOT DETECTED NOT DETECTED Final   Stenotrophomonas maltophilia NOT DETECTED NOT DETECTED Final   Candida albicans NOT DETECTED NOT DETECTED Final   Candida auris NOT DETECTED NOT DETECTED Final   Candida glabrata NOT DETECTED NOT DETECTED Final   Candida krusei NOT DETECTED NOT DETECTED Final   Candida parapsilosis NOT DETECTED NOT DETECTED Final   Candida tropicalis NOT DETECTED NOT  DETECTED Final   Cryptococcus neoformans/gattii NOT DETECTED NOT DETECTED Final   Methicillin resistance mecA/C DETECTED (A) NOT DETECTED Final    Comment: CRITICAL RESULT CALLED TO, READ BACK BY AND VERIFIED WITH: PHARM D L.POOLE AT 1039 ON 07/31/2022 BY T.SAAD. Performed at [facility], [address] [place], [place] [address]   Blood Culture (routine x 2)     Status: None (Preliminary result)   Collection Time: 07/29/22 12:20 PM   Specimen: Left Antecubital; Blood  Result Value Ref Range Status   Specimen Description LEFT ANTECUBITAL  Final   Special Requests   Final    BOTTLES DRAWN AEROBIC AND ANAEROBIC Blood Culture adequate volume   Culture   Final    NO GROWTH 4 DAYS Performed at [facility], [address], [place], [place] [address]    Report Status PENDING  Incomplete  Urine Culture (for pregnant, neutropenic or urologic patients or patients with an indwelling urinary catheter)     Status: Abnormal   Collection Time: 07/29/22  9:54 PM   Specimen: Urine, Clean Catch  Result Value Ref Range Status   Specimen Description   Final    URINE, CLEAN CATCH Performed at [facility], [address] [place], [place] [address]    Special Requests   Final    NONE Performed at [facility], [address] [place], [place] [address]    Culture >=100,000 COLONIES/mL KLEBSIELLA PNEUMONIAE (A)  Final   Report Status 08/01/2022 FINAL  Final   Organism ID, Bacteria KLEBSIELLA PNEUMONIAE (A)  Final      Susceptibility   Klebsiella pneumoniae - MIC*    AMPICILLIN >=32 RESISTANT Resistant     CEFAZOLIN <=4 SENSITIVE Sensitive  CEFEPIME <=0.12 SENSITIVE Sensitive     CEFTRIAXONE <=0.25 SENSITIVE Sensitive     CIPROFLOXACIN <=0.25 SENSITIVE Sensitive     GENTAMICIN <=1 SENSITIVE Sensitive     IMIPENEM <=0.25 SENSITIVE Sensitive     NITROFURANTOIN 64 INTERMEDIATE Intermediate     TRIMETH/SULFA >=320 RESISTANT Resistant     AMPICILLIN/SULBACTAM 4 SENSITIVE Sensitive      PIP/TAZO <=4 SENSITIVE Sensitive     * >=100,000 COLONIES/mL KLEBSIELLA PNEUMONIAE  MRSA Next Gen by PCR, Nasal     Status: Abnormal   Collection Time: 07/31/22  3:30 PM   Specimen: Nasal Mucosa; Nasal Swab  Result Value Ref Range Status   MRSA by PCR Next Gen DETECTED (A) NOT DETECTED Final    Comment:        The GeneXpert MRSA Assay (FDA approved for NASAL specimens only), is one component of a comprehensive MRSA colonization surveillance program. It is not intended to diagnose MRSA infection nor to guide or monitor treatment for MRSA infections. RESULT CALLED TO, READ BACK BY AND VERIFIED WITH:  ANGEL RAY 07/31/22 1748 NMN Performed at [facility], [address], [place], [place] [address]    Today   Subjective    Terrance Miller today has no new concerns No fever  Or chills   No Nausea, Vomiting or Diarrhea   Patient has been seen and examined prior to discharge   Objective   Blood pressure (!) 163/72, pulse (!) 57, temperature 98.3 F (36.8 C), temperature source Oral, resp. rate 18, height 5\' 7"  (1.702 m), weight 107.1 kg, SpO2 100%.   Intake/Output Summary (Last 24 hours) at 08/02/2022 1349 Last data filed at 08/02/2022 1200 Gross per 24 hour  Intake 480 ml  Output 1100 ml  Net -620 ml   Exam Gen:- Awake Alert, no acute distress HEENT:- [place].AT, No sclera icterus Neck-Supple Neck,No JVD,.  Lungs-  CTAB , fair symmetrical air movement CV- S1, S2 normal, regular  Abd-  +ve B.Sounds, Abd Soft, No tenderness,    Extremity/Skin:-Chronic venous stasis and discoloration of lower extremities, pedal pulses present  Psych-affect is appropriate, oriented x3 Neuro-generalized weakness, no new focal deficits, no tremors GU-chronic indwelling Foley--POA--Coude Foley catheter changed out on 08/01/2022   Data Review   CBC w Diff:  Lab Results  Component Value Date   WBC 4.5 08/01/2022   HGB 8.2 (L) 08/01/2022   HGB 9.7 (L) 11/04/2021   HCT 25.2 (L) 08/01/2022   HCT  28.6 (L) 11/04/2021   PLT 132 (L) 08/01/2022   PLT 124 (L) 11/04/2021   LYMPHOPCT 18 07/29/2022   BANDSPCT 1 07/29/2022   MONOPCT 6 07/29/2022   EOSPCT 4 07/29/2022   BASOPCT 1 07/29/2022   CMP:  Lab Results  Component Value Date   NA 140 07/31/2022   NA 150 (H) 11/04/2021   K 3.6 07/31/2022   CL 114 (H) 07/31/2022   CO2 21 (L) 07/31/2022   BUN 22 07/31/2022   BUN 16 11/04/2021   CREATININE 1.44 (H) 07/31/2022   CREATININE 1.26 09/28/2013   PROT 7.1 07/29/2022   PROT 6.5 11/04/2021   ALBUMIN 3.0 (L) 07/31/2022   ALBUMIN 3.5 (L) 11/04/2021   BILITOT 2.0 (H) 07/29/2022   BILITOT 1.2 11/04/2021   ALKPHOS 50 07/29/2022   AST 23 07/29/2022   ALT 16 07/29/2022   Total Discharge time is about 33 minutes  Shon Hale M.D on 08/02/2022 at 1:49 PM  Go to [email or web address] -  for contact info  [facility] -  Office  [phone]

## 2022-08-02 NOTE — Discharge Instructions (Signed)
1)Please follow-up with Urologist Dr. Dayton Martes  every month for Foley catheter change  in his office----[facility] [place], [address], [place] [address] Phone Number----[phone]  2) repeat CBC and BMP blood test in about 5 days advised

## 2022-08-02 NOTE — TOC Transition Note (Signed)
Transition of Care [facility]) - CM/SW Discharge Note   Patient Details  Name: Terrance Miller MRN: [id number] Date of Birth: July 15, 1928  Transition of Care [facility] CM/SW Contact:  Leitha Bleak, RN Phone Number: 08/02/2022, 12:00 PM   Clinical Narrative:   Insurance approved SNF for [facility]. CM updated his daughter, RN calling report. DC summary will be sent in the hub when completed.     Final next level of care: Skilled Nursing Facility Barriers to Discharge: Barriers Resolved   Patient Goals and CMS Choice   Choice offered to / list presented to : Adult Children  Discharge Placement                  Patient to be transferred to facility by: [facility] Staff Name of family member notified: [name] Patient and family notified of of transfer: 08/02/22  Discharge Plan and Services Additional resources added to the After Visit Summary for   In-house Referral: Clinical Social Work   Post Acute Care Choice: Skilled Nursing Facility                Social Determinants of Health (SDOH) Interventions SDOH Screenings   Food Insecurity: No Food Insecurity (07/30/2022)  Housing: Low Risk  (07/30/2022)  Transportation Needs: No Transportation Needs (07/30/2022)  Utilities: Not At Risk (07/30/2022)  Alcohol Screen: Low Risk  (07/25/2022)  Depression (PHQ2-9): Low Risk  (07/25/2022)  Financial Resource Strain: Low Risk  (07/25/2022)  Physical Activity: Insufficiently Active (07/25/2022)  Social Connections: Socially Isolated (07/25/2022)  Stress: No Stress Concern Present (07/25/2022)  Tobacco Use: Medium Risk (07/29/2022)  Health Literacy: Adequate Health Literacy (07/25/2022)     Readmission Risk Interventions     No data to display

## 2022-08-04 ENCOUNTER — Non-Acute Institutional Stay (SKILLED_NURSING_FACILITY): Payer: Medicare Other | Admitting: Adult Health

## 2022-08-04 ENCOUNTER — Encounter: Payer: Self-pay | Admitting: Adult Health

## 2022-08-04 ENCOUNTER — Telehealth: Payer: Self-pay | Admitting: *Deleted

## 2022-08-04 DIAGNOSIS — D539 Nutritional anemia, unspecified: Secondary | ICD-10-CM

## 2022-08-04 DIAGNOSIS — N390 Urinary tract infection, site not specified: Secondary | ICD-10-CM

## 2022-08-04 DIAGNOSIS — E785 Hyperlipidemia, unspecified: Secondary | ICD-10-CM

## 2022-08-04 DIAGNOSIS — E876 Hypokalemia: Secondary | ICD-10-CM

## 2022-08-04 DIAGNOSIS — L853 Xerosis cutis: Secondary | ICD-10-CM | POA: Diagnosis not present

## 2022-08-04 DIAGNOSIS — B9689 Other specified bacterial agents as the cause of diseases classified elsewhere: Secondary | ICD-10-CM

## 2022-08-04 DIAGNOSIS — K5909 Other constipation: Secondary | ICD-10-CM

## 2022-08-04 DIAGNOSIS — Z978 Presence of other specified devices: Secondary | ICD-10-CM

## 2022-08-04 DIAGNOSIS — G9341 Metabolic encephalopathy: Secondary | ICD-10-CM | POA: Diagnosis not present

## 2022-08-04 DIAGNOSIS — H40053 Ocular hypertension, bilateral: Secondary | ICD-10-CM | POA: Diagnosis not present

## 2022-08-04 DIAGNOSIS — N1832 Chronic kidney disease, stage 3b: Secondary | ICD-10-CM | POA: Diagnosis not present

## 2022-08-04 DIAGNOSIS — N5089 Other specified disorders of the male genital organs: Secondary | ICD-10-CM

## 2022-08-04 DIAGNOSIS — R600 Localized edema: Secondary | ICD-10-CM | POA: Diagnosis not present

## 2022-08-04 DIAGNOSIS — N183 Chronic kidney disease, stage 3 unspecified: Secondary | ICD-10-CM

## 2022-08-04 DIAGNOSIS — N138 Other obstructive and reflux uropathy: Secondary | ICD-10-CM

## 2022-08-04 DIAGNOSIS — I700 Atherosclerosis of aorta: Secondary | ICD-10-CM | POA: Diagnosis not present

## 2022-08-04 DIAGNOSIS — N401 Enlarged prostate with lower urinary tract symptoms: Secondary | ICD-10-CM | POA: Diagnosis not present

## 2022-08-04 DIAGNOSIS — I129 Hypertensive chronic kidney disease with stage 1 through stage 4 chronic kidney disease, or unspecified chronic kidney disease: Secondary | ICD-10-CM

## 2022-08-04 NOTE — Progress Notes (Unsigned)
  Care Coordination  Outreach Note  08/04/2022 Name: Terrance Miller MRN: [id number] DOB: 09/24/1928   Care Coordination Outreach Attempts: An unsuccessful telephone outreach was attempted today to offer the patient information about available care coordination services.  Follow Up Plan:  Additional outreach attempts will be made to offer the patient care coordination information and services.   Encounter Outcome:  Pt. Request to Call Back  [facility] Coordination Care Guide  Direct Dial: [phone]
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
## 2022-08-05 ENCOUNTER — Non-Acute Institutional Stay (SKILLED_NURSING_FACILITY): Payer: Medicare Other | Admitting: Internal Medicine

## 2022-08-05 ENCOUNTER — Encounter: Payer: Self-pay | Admitting: Internal Medicine

## 2022-08-05 DIAGNOSIS — I10 Essential (primary) hypertension: Secondary | ICD-10-CM | POA: Diagnosis not present

## 2022-08-05 DIAGNOSIS — N5089 Other specified disorders of the male genital organs: Secondary | ICD-10-CM | POA: Insufficient documentation

## 2022-08-05 DIAGNOSIS — N138 Other obstructive and reflux uropathy: Secondary | ICD-10-CM | POA: Insufficient documentation

## 2022-08-05 DIAGNOSIS — H40053 Ocular hypertension, bilateral: Secondary | ICD-10-CM | POA: Insufficient documentation

## 2022-08-05 DIAGNOSIS — K5909 Other constipation: Secondary | ICD-10-CM | POA: Insufficient documentation

## 2022-08-05 DIAGNOSIS — R600 Localized edema: Secondary | ICD-10-CM | POA: Diagnosis not present

## 2022-08-05 DIAGNOSIS — B9689 Other specified bacterial agents as the cause of diseases classified elsewhere: Secondary | ICD-10-CM | POA: Diagnosis not present

## 2022-08-05 DIAGNOSIS — N390 Urinary tract infection, site not specified: Secondary | ICD-10-CM

## 2022-08-05 DIAGNOSIS — G9341 Metabolic encephalopathy: Secondary | ICD-10-CM | POA: Diagnosis not present

## 2022-08-05 DIAGNOSIS — I700 Atherosclerosis of aorta: Secondary | ICD-10-CM | POA: Insufficient documentation

## 2022-08-05 DIAGNOSIS — I129 Hypertensive chronic kidney disease with stage 1 through stage 4 chronic kidney disease, or unspecified chronic kidney disease: Secondary | ICD-10-CM | POA: Insufficient documentation

## 2022-08-05 NOTE — Patient Instructions (Signed)
See assessment and plan under each diagnosis in the problem list and acutely for this visit 

## 2022-08-05 NOTE — Assessment & Plan Note (Signed)
Assess response to trial off vasodilating CCB.Initiate alternative anti-hypertensive agent if HTN prrsent off CCB.

## 2022-08-05 NOTE — Progress Notes (Unsigned)
NURSING HOME LOCATION:  [facility] ROOM NUMBER:  [id number]  CODE STATUS: Full Code   PCP:  [name] DO  This is a comprehensive admission note to this SNFperformed on this date less than 30 days from date of admission. Included are preadmission medical/surgical history; reconciled medication list; family history; social history and comprehensive review of systems.  Corrections and additions to the records were documented. Comprehensive physical exam was also performed. Additionally a clinical summary was entered for each active diagnosis pertinent to this admission in the Problem List to enhance continuity of care.  HPI: He was hospitalized 7/23 - 08/02/2022 with acute metabolic encephalopathy in the context of UTI associated with indwelling urethral catheter. He presented with generalized weakness progressive over 5 days PTA.  In the ED blood pressure was 200/111.  UA revealed few bacteria with greater than 50 WBCs.  Empirically he received Cipro 500 mg x 1 with plans to send him home on oral antibiotics.  Because of the severe weakness patient was unable to ambulate prompting admission. Head CT was negative for acute process.  Urine culture subsequently revealed Klebsiella.  He received IV ceftriaxone with transition to Keflex at discharge. Coud Foley exchange was completed 7/26.  Outpatient monthly monitor & Foley catheter change @ his Urologist office was recommended. Significant peripheral edema was present with 2+ pitting.  This is in the context of history of pulmonary hypertension.  Previous ECHO has had revealed an elevated pulmonary artery systolic pressure with RV systolic pressure of 62. While hospitalized hemoglobin/hematocrit ranged from 8.2/25.2 -9.0/27.2.  Indices are macrocytic with a MCV of 103.8.  The last B12 level on record was 1176 on 12/05/2021. While hospitalized peak creatinine was 1.48 and nadir GFR 44.  Albumin was 3.0 but total protein was normal at  7.1 There was serial improvement in his mentation and he was felt to be back at baseline; ambulation was severely compromised by his weakness prompting discharge to SNF for rehab.  Past medical and surgical history includes CAD with history of STEMI, dyslipidemia, hypertension, and diabetes with vascular complications. Surgeries and procedures include coronary angioplasty with stent placement, flexible sigmoidoscopy, and bilateral hydrocele excision. Bilateral hydrocelectomy was performed by Dr. Pete Glatter in November 2023.  Postop course was complicated by wound dehiscence which has been treated with dressing changes since.  Indwelling Foley catheter is changed monthly at the urology office.  Social history: former smoker & former drinker.  Family history: Limited history reviewed.   Review of systems was compromised by clinical neurocognitive deficits.  When I asked what diagnoses had been made while hospitalized his response was "my daughter does all that."  He then went on to state "I cannot hear nothing and cannot half see."  He does validate swelling in his feet and his testicles.  He also went on to state that sometimes "my legs get cold."  Constitutional: No fever, significant weight change  Eyes: No redness, discharge, pain, vision change ENT/mouth: No nasal congestion, purulent discharge, earache, change in hearing, sore throat  Cardiovascular: No chest pain, palpitations, paroxysmal nocturnal dyspnea  Respiratory: No cough, sputum production, hemoptysis, DOE, significant snoring, apnea  Gastrointestinal: No heartburn, dysphagia, abdominal pain, nausea /vomiting, rectal bleeding, melena, change in bowels Genitourinary: No dysuria, hematuria, pyuria Musculoskeletal: No joint stiffness, joint swelling, weakness, pain Dermatologic: No rash, pruritus, change in appearance of skin Neurologic: No dizziness, headache, syncope, seizures, numbness, tingling Psychiatric: No significant anxiety,  depression, insomnia, anorexia Endocrine: No change in  hair/skin/nails, excessive thirst, excessive hunger, excessive urination  Hematologic/lymphatic: No significant bruising, lymphadenopathy, abnormal bleeding Allergy/immunology: No itchy/watery eyes, significant sneezing, urticaria, angioedema  Physical exam:  Pertinent or positive findings: He appears his age and chronically ill.  Pattern alopecia is present.  Eyebrows are decreased laterally.  He has a IT consultant.  Facies are blank.  He is hard of hearing.  Responses are markedly slow.  He has only two lower anterior mandibular teeth which are black and crusted.  Heart rate is slow; both S1 and S2 accentuated, S2 more so than S1.  Abdomen is protuberant.  Foley catheter is in place.  Pedal pulses are nonpalpable.  He has tense edema of the lower extremities with 1+ pitting.  He has extensive hyperpigmented, keratotic skin changes over the shins.  Clubbing of the nails beds is suggested.  General appearance:  no acute distress, increased work of breathing is present.   Lymphatic: No lymphadenopathy about the head, neck, axilla. Eyes: No conjunctival inflammation or lid edema is present. There is no scleral icterus. Ears:  External ear exam shows no significant lesions or deformities.   Nose:  External nasal examination shows no deformity or inflammation. Nasal mucosa are pink and moist without lesions, exudates Oral exam: Lips and gums are healthy appearing.There is no oropharyngeal erythema or exudate. Neck:  No thyromegaly, masses, tenderness noted.    Heart:  No gallop, murmur, click, rub.  Lungs: Chest clear to auscultation without wheezes, rhonchi, rales, rubs. Abdomen: Bowel sounds are normal.  Abdomen is soft and nontender with no organomegaly, hernias, masses. GU: Deferred  Extremities:  No cyanosis Neurologic exam:  Balance, Rhomberg, finger to nose testing could not be completed due to clinical state  See clinical summary under each  active problem in the Problem List with associated updated therapeutic plan

## 2022-08-05 NOTE — Assessment & Plan Note (Addendum)
Exam suggest significant dementia.  CT of the head without contrast 07/29/2022 revealed mild chronic microvascular ischemic changes and generalized volume loss. SLUMS is a mental status assessment of possible dementia published by the [facility]. [employer]. The test differentiates between high school or less education level in reference to presence or absence of dementia. For high school education a score of 27-30 is normal, 21-26 is minimal neurocognitive deficit and 1-20 suggests dementia. With less than a high school education similar scoring is 25-30, 20-24, and 1-19. He has HS education. 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  Multiple to

## 2022-08-05 NOTE — Assessment & Plan Note (Addendum)
Afebrile complete full course antibiotics as of 8/2.

## 2022-08-06 NOTE — Progress Notes (Signed)
Upon chart review patient is at [facility] [employer] and referral at this time. Will outreach again if new referral is placed.   [facility]  Care Coordination Care Guide  Direct Dial: [phone]

## 2022-08-06 NOTE — Assessment & Plan Note (Signed)
BP controlled;but due to profound edema a trial off Amlodipine will be pursued with close BP monitor.

## 2022-08-07 ENCOUNTER — Other Ambulatory Visit (HOSPITAL_COMMUNITY)
Admission: RE | Admit: 2022-08-07 | Discharge: 2022-08-07 | Disposition: A | Discharge status: Home / Self Care | Payer: Medicare Other | Source: Skilled Nursing Facility | Attending: Internal Medicine | Admitting: Internal Medicine

## 2022-08-07 DIAGNOSIS — E349 Endocrine disorder, unspecified: Secondary | ICD-10-CM | POA: Insufficient documentation

## 2022-08-07 DIAGNOSIS — I152 Hypertension secondary to endocrine disorders: Secondary | ICD-10-CM | POA: Insufficient documentation

## 2022-08-07 LAB — CBC
HCT: 24.7 % — ABNORMAL LOW (ref 39.0–52.0)
Hemoglobin: 8.1 g/dL — ABNORMAL LOW (ref 13.0–17.0)
MCH: 33.9 pg (ref 26.0–34.0)
MCHC: 32.8 g/dL (ref 30.0–36.0)
MCV: 103.3 fL — ABNORMAL HIGH (ref 80.0–100.0)
Platelets: 136 10*3/uL — ABNORMAL LOW (ref 150–400)
RBC: 2.39 MIL/uL — ABNORMAL LOW (ref 4.22–5.81)
RDW: 27.9 % — ABNORMAL HIGH (ref 11.5–15.5)
WBC: 4.9 10*3/uL (ref 4.0–10.5)
nRBC: 2.7 % — ABNORMAL HIGH (ref 0.0–0.2)

## 2022-08-07 LAB — BASIC METABOLIC PANEL
Anion gap: 6 (ref 5–15)
BUN: 22 mg/dL (ref 8–23)
CO2: 23 mmol/L (ref 22–32)
Calcium: 8.4 mg/dL — ABNORMAL LOW (ref 8.9–10.3)
Chloride: 109 mmol/L (ref 98–111)
Creatinine, Ser: 1.4 mg/dL — ABNORMAL HIGH (ref 0.61–1.24)
GFR, Estimated: 47 mL/min — ABNORMAL LOW (ref >60)
Glucose, Bld: 104 mg/dL — ABNORMAL HIGH (ref 70–99)
Potassium: 3.9 mmol/L (ref 3.5–5.1)
Sodium: 138 mmol/L (ref 135–145)

## 2022-08-12 ENCOUNTER — Ambulatory Visit: Payer: Self-pay | Admitting: *Deleted

## 2022-08-12 NOTE — Patient Outreach (Signed)
  Care Coordination   Case closure   Visit Note   08/12/2022 Name: Taino Schmied MRN: [id number] DOB: [birth year]  Ramaj Hemphill is a 89 y.o. year old male who sees [name], DO for primary care. I  was informed of patient facility placement by [facility] hospital liaison   What matters to the patients health and wellness today?  discharged to [facility] today.      Goals Addressed             This Visit's Progress    COMPLETED: [facility] care coordination services       Interventions Today    Flowsheet Row Most Recent Value  Chronic Disease   Chronic disease during today's visit Other  [transferred to [facility] facility 08/01/22]              SDOH assessments and interventions completed:  No     Care Coordination Interventions:  Yes, provided   Follow up plan: No further intervention required.   Encounter Outcome:  Pt. Visit Completed    L. Noelle Penner, RN, BSN, CCM [facility] Care Management Community Coordinator Office number [phone]

## 2022-08-12 NOTE — Patient Instructions (Signed)
Visit Information  Thank you for taking time to visit with me today. Please don't hesitate to contact me if I can be of assistance to you.   Following are the goals we discussed today:   Goals Addressed             This Visit's Progress    [facility] care coordination services       Interventions Today    Flowsheet Row Most Recent Value  Chronic Disease   Chronic disease during today's visit Other  [DME order/goodwill search]  General Interventions   General Interventions Discussed/Reviewed General Interventions Reviewed, Durable Medical Equipment (DME), Doctor Visits  Doctor Visits Discussed/Reviewed Doctor Visits Reviewed, PCP, Specialist  Durable Medical Equipment (DME) Wheelchair  Wheelchair Standard  PCP/Specialist Visits Compliance with follow-up visit  Education Interventions   Education Provided Provided Education  [use of goodwill for DME not covered by insurance]              Our next appointment is by telephone on 08/12/22 at n/a  Please call the care guide team at [phone] if you need to cancel or reschedule your appointment.   If you are experiencing a Mental Health or Behavioral Health Crisis or need someone to talk to, please call the Suicide and Crisis Lifeline: 988 call the [place] National Suicide Prevention Lifeline: 719-399-0213 or TTY: 670 295 1578 TTY 989-758-0390) to talk to a trained counselor call 1-800-273-TALK (toll free, 24 hour hotline) go to [facility] [address], [place] [phone]) call the [facility]: [phone] call 911   No computer access, no preference for copy of AVS      The patient has been provided with contact information for the care management team and has been advised to call with any health related questions or concerns.     L. Noelle Penner, RN, BSN, CCM [facility] Care Management Community Coordinator Office number [phone]

## 2022-08-12 NOTE — Patient Instructions (Signed)
Visit Information  Thank you for taking time to visit with me today. Please don't hesitate to contact me if I can be of assistance to you.   Following are the goals we discussed today:   Goals Addressed             This Visit's Progress    COMPLETED: [facility] care coordination services       Interventions Today    Flowsheet Row Most Recent Value  Chronic Disease   Chronic disease during today's visit Other  [transferred to [facility] facility 08/01/22]              Our next appointment is  n/a  on n/a at n/a  Please call the care guide team at [phone] if you need to cancel or reschedule your appointment.   If you are experiencing a Mental Health or Behavioral Health Crisis or need someone to talk to, please call the Suicide and Crisis Lifeline: 988 call the [place] National Suicide Prevention Lifeline: (607) 107-6196 or TTY: 908-839-5096 TTY 267-410-8354) to talk to a trained counselor call 1-800-273-TALK (toll free, 24 hour hotline) call the [facility]: [phone] call 911   N/a  The patient has been provided with contact information for the care management team and has been advised to call with any health related questions or concerns.    L. Noelle Penner, RN, BSN, CCM [facility] Care Management Community Coordinator Office number [phone]

## 2022-08-14 ENCOUNTER — Telehealth: Payer: Self-pay | Admitting: *Deleted

## 2022-08-14 NOTE — Progress Notes (Signed)
  Care Coordination   Note   08/14/2022 Name: [name] MRN: [id number] DOB: 1928-05-11  [name] is a 89 y.o. year old male who sees [name], DO for primary care. I reached out to [name] by phone today to offer care coordination services.  Mr. Ketelhut was given information about Care Coordination services today including:   The Care Coordination services include support from the care team which includes your Nurse Coordinator, Clinical Social Worker, or Pharmacist.  The Care Coordination team is here to help remove barriers to the health concerns and goals most important to you. Care Coordination services are voluntary, and the patient may decline or stop services at any time by request to their care team member.   Care Coordination Consent Status: Patient agreed to services and verbal consent obtained.   Follow up plan:  Telephone appointment with care coordination team member scheduled for:  08/18/22  Encounter Outcome:  Pt. Scheduled  [facility] Coordination Care Guide  Direct Dial: [phone]

## 2022-08-15 ENCOUNTER — Non-Acute Institutional Stay: Payer: Self-pay | Admitting: Adult Health

## 2022-08-15 ENCOUNTER — Encounter: Payer: Self-pay | Admitting: Adult Health

## 2022-08-15 DIAGNOSIS — F0150 Vascular dementia without behavioral disturbance: Secondary | ICD-10-CM | POA: Diagnosis not present

## 2022-08-15 DIAGNOSIS — I129 Hypertensive chronic kidney disease with stage 1 through stage 4 chronic kidney disease, or unspecified chronic kidney disease: Secondary | ICD-10-CM | POA: Diagnosis not present

## 2022-08-15 DIAGNOSIS — F0390 Unspecified dementia without behavioral disturbance: Secondary | ICD-10-CM | POA: Diagnosis not present

## 2022-08-15 DIAGNOSIS — N183 Chronic kidney disease, stage 3 unspecified: Secondary | ICD-10-CM

## 2022-08-15 DIAGNOSIS — I700 Atherosclerosis of aorta: Secondary | ICD-10-CM | POA: Diagnosis not present
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
## 2022-08-18 ENCOUNTER — Ambulatory Visit: Payer: Self-pay | Admitting: Licensed Clinical Social Worker

## 2022-08-18 ENCOUNTER — Other Ambulatory Visit: Payer: Self-pay | Admitting: Adult Health

## 2022-08-18 ENCOUNTER — Non-Acute Institutional Stay: Payer: Self-pay | Admitting: Adult Health

## 2022-08-18 ENCOUNTER — Encounter: Payer: Self-pay | Admitting: Adult Health

## 2022-08-18 DIAGNOSIS — F0390 Unspecified dementia without behavioral disturbance: Secondary | ICD-10-CM | POA: Diagnosis not present

## 2022-08-18 DIAGNOSIS — I700 Atherosclerosis of aorta: Secondary | ICD-10-CM | POA: Diagnosis not present

## 2022-08-18 DIAGNOSIS — N183 Chronic kidney disease, stage 3 unspecified: Secondary | ICD-10-CM | POA: Diagnosis not present

## 2022-08-18 DIAGNOSIS — I129 Hypertensive chronic kidney disease with stage 1 through stage 4 chronic kidney disease, or unspecified chronic kidney disease: Secondary | ICD-10-CM

## 2022-08-18 DIAGNOSIS — G9341 Metabolic encephalopathy: Secondary | ICD-10-CM

## 2022-08-18 MED ORDER — POTASSIUM CHLORIDE ER 10 MEQ PO TBCR: 10.0000 meq | EXTENDED_RELEASE_TABLET | Freq: Every day | ORAL | 0 refills | Status: DC

## 2022-08-18 MED ORDER — LATANOPROST 0.005 % OP SOLN: 1.0000 [drp] | Freq: Two times a day (BID) | OPHTHALMIC | 0 refills | Status: DC

## 2022-08-18 MED ORDER — CARVEDILOL 6.25 MG PO TABS: 6.2500 mg | ORAL_TABLET | Freq: Two times a day (BID) | ORAL | 0 refills | Status: DC

## 2022-08-18 MED ORDER — FUROSEMIDE 20 MG PO TABS: 20.0000 mg | ORAL_TABLET | Freq: Every day | ORAL | 0 refills | Status: DC

## 2022-08-18 MED ORDER — TRAVATAN Z 0.004 % OP SOLN: 1.0000 [drp] | Freq: Two times a day (BID) | OPHTHALMIC | 0 refills | Status: DC

## 2022-08-18 MED ORDER — TAMSULOSIN HCL 0.4 MG PO CAPS: 0.4000 mg | ORAL_CAPSULE | Freq: Every day | ORAL | 0 refills | Status: DC

## 2022-08-18 MED ORDER — FOLIC ACID 1 MG PO TABS: 1.0000 mg | ORAL_TABLET | Freq: Every day | ORAL | 0 refills | Status: DC
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
## 2022-08-19 ENCOUNTER — Other Ambulatory Visit: Payer: Self-pay | Admitting: *Deleted

## 2022-08-19 NOTE — Patient Outreach (Signed)
Per [facility] Terrance Miller discharged from [facility] skilled nursing facility on 08/18/22. Screening for potential care coordination services as benefit of health plan and  Primary Care Provider. Terrance Miller was active with RN care coordinator prior to admission.  Confirmed with Melton Alar social worker Terrance Miller transitioned to home with [facility] home health.   Will update care coordination team.  Raiford Noble, MSN, RN,BSN Post Acute Care Coordinator [phone] (Direct dial)

## 2022-08-20 ENCOUNTER — Ambulatory Visit: Payer: Self-pay | Admitting: *Deleted

## 2022-08-20 ENCOUNTER — Other Ambulatory Visit: Payer: Self-pay | Admitting: Adult Health

## 2022-08-20 MED ORDER — TAMSULOSIN HCL 0.4 MG PO CAPS: 0.4000 mg | ORAL_CAPSULE | Freq: Every day | ORAL | 0 refills | Status: DC

## 2022-08-20 MED ORDER — FOLIC ACID 1 MG PO TABS: 1.0000 mg | ORAL_TABLET | Freq: Every day | ORAL | 0 refills | Status: DC

## 2022-08-20 MED ORDER — FUROSEMIDE 20 MG PO TABS: 20.0000 mg | ORAL_TABLET | Freq: Every day | ORAL | 0 refills | Status: DC

## 2022-08-20 MED ORDER — POTASSIUM CHLORIDE ER 10 MEQ PO TBCR: 10.0000 meq | EXTENDED_RELEASE_TABLET | Freq: Every day | ORAL | 0 refills | Status: DC

## 2022-08-20 MED ORDER — CARVEDILOL 6.25 MG PO TABS: 6.2500 mg | ORAL_TABLET | Freq: Two times a day (BID) | ORAL | 0 refills | Status: DC

## 2022-08-20 MED ORDER — LATANOPROST 0.005 % OP SOLN: 1.0000 [drp] | Freq: Two times a day (BID) | OPHTHALMIC | 0 refills | Status: DC

## 2022-08-20 MED ORDER — TRAVATAN Z 0.004 % OP SOLN: 1.0000 [drp] | Freq: Two times a day (BID) | OPHTHALMIC | 0 refills | Status: DC

## 2022-08-20 NOTE — Patient Outreach (Signed)
Care Coordination   Initial Visit Note   08/22/2022 Name: Skippy Rohn MRN: [id number] DOB: 1928/03/24  Rodregus Danby is a 89 y.o. year old male who sees [name], DO for primary care. I spoke with  [name] by phone today.  What matters to the patients health and wellness today?  Seeking to find a good caring long term skilled nursing  Doing fine, active walking. Need FL2 to be completed and sent to skilled nursing facility  Discharged [facility] from [facility] skilled nursing facility. Has also spent time in [facility] facility   Prescriptions pending from [employer] called snf SW today to get medicines to pharmacy, [facility] need new medicine   Center well call next week postpone  Long term care placement Voiced interest in 2 skilled nursing facilities:  [facility], [place] , [facility],  [place] inquired: How facility set up for assisted living and nursing She has filled out form for Ltc pending SSI  Working with Jasmine < [facility] SW, to try to kind a facility local   Goals Addressed             This Visit's Progress    [facility] care coordination services goal long term care facility placement       Interventions Today    Flowsheet Row Most Recent Value  Chronic Disease   Chronic disease during today's visit Other  General Interventions   General Interventions Discussed/Reviewed General Interventions Reviewed, [facility], Doctor Visits, Communication with, Level of Care  Doctor Visits Discussed/Reviewed Doctor Visits Reviewed, PCP  PCP/Specialist Visits Compliance with follow-up visit  Communication with Social Work, Pharmacists  [outreach to [facility] facility liaison to discussed patient missing clothes. outreach to [facility] SW to discuss assist with long term care faciity & FL2. Outreach to [facility] center SW/CM about patient missing clothes, prescriptions to pharmacy]  Level of Care Skilled Nursing Facility,  Applications  [confirmed interest in ong term care placemnt for Mr Forman]  Applications FL-2, Other  [[name] confirms a FL2 is needed for future facility. Confirmed she has completed long term care forms, and will work on Tree [facility] forms]  Exercise Interventions   Exercise Discussed/Reviewed Exercise Discussed, Physical Activity  Education Interventions   Education Provided Provided Education  Provided Actuary, Medication, [facility], Other  [Information provided on differences in skilled nursing levels of care and assisted living, what is entered on the [facility], Disccused facility bed offers, Encouraged facility visits, Discussed lost clothes/loss prevention suggestions]  Applications FL-2, Other  [[name] confirms a FL2 is needed for future facility. Confirmed she has completed long term care forms, and will work on Tree [facility] forms]  Mental Health Interventions   Mental Health Discussed/Reviewed Mental Health Reviewed, Coping Strategies  Pharmacy Interventions   Pharmacy Dicussed/Reviewed Pharmacy Topics Discussed, Medications and their functions  [Discussed needing a list of medicines as some medicines were changed while at [facility]. [facility]ed [facility] SW send [name] a copy of the reconciled medicine list to prevent medicine errors]              SDOH assessments and interventions completed:  Yes  SDOH Interventions Today    Flowsheet Row Most Recent Value  SDOH Interventions   Food Insecurity Interventions Intervention Not Indicated  Housing Interventions Intervention Not Indicated  Transportation Interventions Intervention Not Indicated  Utilities Interventions Intervention Not Indicated  Financial Strain Interventions Intervention Not Indicated  Stress Interventions  Intervention Not Indicated        Care Coordination Interventions:  Yes, provided   Follow up plan: Follow up call scheduled for 09/25/22      Encounter Outcome:  Pt. Visit Completed

## 2022-08-20 NOTE — Patient Instructions (Signed)
Visit Information  Thank you for taking time to visit with me today. Please don't hesitate to contact me if I can be of assistance to you.   Following are the goals we discussed today:   Goals Addressed             This Visit's Progress    Obtain Suppportive Resources-Placement   On track    Activities and task to complete in order to accomplish goals.   Keep all upcoming appointments discussed today Continue with compliance of taking medication prescribed by Doctor Implement healthy coping skills discussed to assist with management of symptoms         Our next appointment is by telephone on 08/19 at 3:30 PM  Please call the care guide team at [phone] if you need to cancel or reschedule your appointment.   If you are experiencing a Mental Health or Behavioral Health Crisis or need someone to talk to, please call the Suicide and Crisis Lifeline: 988 call 911   The patient verbalized understanding of instructions, educational materials, and care plan provided today and DECLINED offer to receive copy of patient instructions, educational materials, and care plan.   Jenel Lucks, MSW, LCSW [facility] Care Management [place]  [facility] [email or web address] Phone [phone] 8:47 AM

## 2022-08-20 NOTE — Patient Outreach (Signed)
  Care Coordination   Initial Visit Note   08/18/2022 Name: Lebrandon Huska MRN: [id number] DOB: [birth year]  Kuldeep Cotes is a 89 y.o. year old male who sees [name], DO for primary care. I spoke with  Smitty Cords adult daughter by phone today.  What matters to the patients health and wellness today?  Levels of Care/Placement    Goals Addressed             This Visit's Progress    Obtain Suppportive Resources-Placement   On track    Activities and task to complete in order to accomplish goals.   Keep all upcoming appointments discussed today Continue with compliance of taking medication prescribed by Doctor Implement healthy coping skills discussed to assist with management of symptoms         SDOH assessments and interventions completed:  No     Care Coordination Interventions:  Yes, provided  Interventions Today    Flowsheet Row Most Recent Value  Chronic Disease   Chronic disease during today's visit Hypertension (HTN), Chronic Kidney Disease/End Stage Renal Disease (ESRD), Other  [CAD and Vascular Dementia]  General Interventions   General Interventions Discussed/Reviewed General Interventions Discussed, Doctor Visits, Level of Care, Community Resources  Doctor Visits Discussed/Reviewed Doctor Visits Discussed  Level of Care Personal Care Services, Assisted Living, Skilled Nursing Facility, Adult Daycare  Mental Health Interventions   Mental Health Discussed/Reviewed Mental Health Discussed, Coping Strategies  [Caregiver Strain]  Nutrition Interventions   Nutrition Discussed/Reviewed Nutrition Discussed  Pharmacy Interventions   Pharmacy Dicussed/Reviewed Pharmacy Topics Discussed, Medication Adherence  Safety Interventions   Safety Discussed/Reviewed Safety Discussed       Follow up plan: Follow up call scheduled for 1-2 weeks    Encounter Outcome:  Pt. Visit Completed   Jenel Lucks, MSW, LCSW [facility] Care Management [facility]  [facility] [email or web address] Phone [phone] 8:46 AM

## 2022-08-21 ENCOUNTER — Telehealth: Payer: Self-pay

## 2022-08-21 NOTE — Telephone Encounter (Signed)
Tried calling patient with no answer and unable to leave vm due to mailbox not set up.

## 2022-08-22 ENCOUNTER — Telehealth: Payer: Self-pay | Admitting: Licensed Clinical Social Worker

## 2022-08-22 NOTE — Patient Outreach (Signed)
  Care Coordination   Follow Up Visit Note   08/19/2022 Name: Terrance Miller MRN: [id number] DOB: [birth year]  Terrance Miller is a 89 y.o. year old male who sees [name], DO for primary care. I  engaged with pt's daughter via email  What matters to the patients health and wellness today?  Placement    Goals Addressed             This Visit's Progress    Obtain Suppportive Resources-Placement   On track    Activities and task to complete in order to accomplish goals.   Keep all upcoming appointments discussed today Continue with compliance of taking medication prescribed by Doctor Implement healthy coping skills discussed to assist with management of symptoms         SDOH assessments and interventions completed:  No     Care Coordination Interventions:  Yes, provided  Interventions Today    Flowsheet Row Most Recent Value  General Interventions   General Interventions Discussed/Reviewed Level of Care  [facility] listing of LTC in [place]]        Follow up plan: Follow up call scheduled for 08/19    Encounter Outcome:  Pt. Visit Completed   Jenel Lucks, MSW, LCSW [facility] Care Management [facility]  [facility] [email or web address] Phone [phone] 6:19 AM

## 2022-08-22 NOTE — Patient Instructions (Signed)
Visit Information  Thank you for taking time to visit with me today. Please don't hesitate to contact me if I can be of assistance to you.   Following are the goals we discussed today:   Goals Addressed             This Visit's Progress    [facility] care coordination services goal long term care facility placement       Interventions Today    Flowsheet Row Most Recent Value  Chronic Disease   Chronic disease during today's visit Other  General Interventions   General Interventions Discussed/Reviewed General Interventions Reviewed, [facility], Doctor Visits, Communication with, Level of Care  Doctor Visits Discussed/Reviewed Doctor Visits Reviewed, PCP  PCP/Specialist Visits Compliance with follow-up visit  Communication with Social Work, Pharmacists  [outreach to [facility] facility liaison to discussed patient missing clothes. outreach to [facility] SW to discuss assist with long term care faciity & FL2. Outreach to [employer] center SW/CM about patient missing clothes, prescriptions to pharmacy]  Level of Care Skilled Nursing Facility, Applications  [confirmed interest in ong term care placemnt for Mr [place]]  Applications FL-2, Other  [[name] confirms a FL2 is needed for future facility. Confirmed she has completed long term care forms, and will work on Tree surgeon forms]  Exercise Interventions   Exercise Discussed/Reviewed Exercise Discussed, Physical Activity  Education Interventions   Education Provided Provided Education  Provided Actuary, Medication, [facility], Other  [Information provided on differences in skilled nursing levels of care and assisted living, what is entered on the [facility], Disccused facility bed offers, Encouraged facility visits, Discussed lost clothes/loss prevention suggestions]  Applications FL-2, Other  [[name] confirms a FL2 is needed for future facility. Confirmed she has completed long term care forms, and will work on Tree surgeon  forms]  Mental Health Interventions   Mental Health Discussed/Reviewed Mental Health Reviewed, Coping Strategies  Pharmacy Interventions   Pharmacy Dicussed/Reviewed Pharmacy Topics Discussed, Medications and their functions  [Discussed needing a list of medicines as some medicines were changed while at the [facility] nursing center. Requested the [facility] center SW send [name] a copy of the reconciled medicine list to prevent medicine errors]              Our next appointment is by telephone on 09/25/22 at 1130  Please call the care guide team at [phone] if you need to cancel or reschedule your appointment.   If you are experiencing a Mental Health or Behavioral Health Crisis or need someone to talk to, please call the Suicide and Crisis Lifeline: 988 call the [place] National Suicide Prevention Lifeline: (202)847-2764 or TTY: (270)496-7540 TTY 562-234-4615) to talk to a trained counselor call 1-800-273-TALK (toll free, 24 hour hotline) call the [facility]: (812)186-4887 call 911   No computer access, no preference for copy of AVS       The patient has been provided with contact information for the care management team and has been advised to call with any health related questions or concerns.   Zira Helinski L. Noelle Penner, RN, BSN, CCM [facility] Care Management Community Coordinator Office number [phone]

## 2022-08-25 ENCOUNTER — Ambulatory Visit: Payer: Medicare Other | Admitting: Family Medicine

## 2022-08-25 ENCOUNTER — Encounter: Payer: Self-pay | Admitting: Licensed Clinical Social Worker

## 2022-08-25 VITALS — BP 152/75 | HR 59 | Wt 208.6 lb

## 2022-08-25 DIAGNOSIS — I10 Essential (primary) hypertension: Secondary | ICD-10-CM

## 2022-08-25 DIAGNOSIS — F0150 Vascular dementia without behavioral disturbance: Secondary | ICD-10-CM

## 2022-08-25 MED ORDER — FUROSEMIDE 20 MG PO TABS: 20.0000 mg | ORAL_TABLET | Freq: Every day | ORAL | 3 refills | Status: DC
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
## 2022-08-25 NOTE — Assessment & Plan Note (Signed)
FL2 formed filled out. Needs higher level of care than he can receive at home.

## 2022-08-25 NOTE — Assessment & Plan Note (Signed)
BP mildly elevated here today. Fair control given advanced age.

## 2022-08-27 ENCOUNTER — Telehealth: Payer: Self-pay | Admitting: Licensed Clinical Social Worker

## 2022-08-27 NOTE — Patient Outreach (Signed)
  Care Coordination   08/25/2022 Name: Terrance Miller MRN: [id number] DOB: 1928-06-04   Care Coordination Outreach Attempts:  An unsuccessful telephone outreach was attempted for a scheduled appointment today.  Follow Up Plan:  Additional outreach attempts will be made to offer the patient care coordination information and services.   Encounter Outcome:  No Answer   Care Coordination Interventions:  No, not indicated    Jenel Lucks, MSW, LCSW [facility] Care Management [place]  [facility] [email or web address] Phone [phone] 12:40 AM

## 2022-08-28 ENCOUNTER — Telehealth: Payer: Self-pay | Admitting: *Deleted

## 2022-08-28 NOTE — Progress Notes (Signed)
  Care Coordination Note  08/28/2022 Name: Terrance Miller MRN: [id number] DOB: Jan 05, 1929  Terrance Miller is a 89 y.o. year old male who is a primary care patient of [name], DO and is actively engaged with the care management team. I reached out to [name] by phone today to assist with re-scheduling a follow up visit with the RN Case Manager and Licensed Clinical Social Worker  Follow up plan: Unsuccessful telephone outreach attempt made.   [facility]  Care Coordination Care Guide  Direct Dial: [phone]

## 2022-09-02 ENCOUNTER — Ambulatory Visit: Payer: Medicare Other | Admitting: Urology

## 2022-09-04 NOTE — Progress Notes (Signed)
  Care Coordination Note  09/04/2022 Name: Terrance Miller MRN: [id number] DOB: 01/07/1928  [name] is a 89 y.o. year old male who is a primary care patient of [name], DO and is actively engaged with the care management team. I reached out to [name] by phone today to assist with re-scheduling a follow up visit with the RN Case Manager and Licensed Clinical Social Worker  Follow up plan: Unsuccessful telephone outreach attempt made. A HIPAA compliant phone message was left for the patient providing contact information and requesting a return call.  We have been unable to make contact with the patient for follow up. The care management team is available to follow up with the patient after provider conversation with the patient regarding recommendation for care management engagement and subsequent re-referral to the care management team.   [facility] Coordination Care Guide  Direct Dial: [phone]

## 2022-09-09 ENCOUNTER — Encounter: Payer: Self-pay | Admitting: Urology

## 2022-09-09 ENCOUNTER — Ambulatory Visit (INDEPENDENT_AMBULATORY_CARE_PROVIDER_SITE_OTHER): Payer: Medicare Other | Admitting: Urology

## 2022-09-09 VITALS — BP 172/80 | HR 55

## 2022-09-09 DIAGNOSIS — R339 Retention of urine, unspecified: Secondary | ICD-10-CM

## 2022-09-09 DIAGNOSIS — Z87440 Personal history of urinary (tract) infections: Secondary | ICD-10-CM | POA: Diagnosis not present

## 2022-09-09 NOTE — Progress Notes (Signed)
History of Present Illness: Terrance Miller comes in today with his daughter for follow-up.  This history is significant for bilateral hydrocelectomy by Dr. Pete Glatter in November 2023.  He had dehiscence of his wound afterwards and an open wound for quite some time treated with dressing changes.  He also has significant genital edema and the need for indwelling catheter due to limited mobility and urinary retention.  Most recent visit was for catheter change here.  His family wanted to maintain his catheterized status for a while while his rehabilitation proceeded.  They would like to consider voiding trial now.  He was recently discharged from the hospital for management of a UTI-Klebsiella.  That time an abnormality of his penis was noted.  Apparently, consultation was provided by a urologist although I cannot see a note in the hospital chart.  He will be going to a rehab center in the next few days.  Question is whether to proceed with voiding trial before that for continuous catheter placement.  Past Medical History:  Diagnosis Date   Acute ST elevation myocardial infarction (STEMI) of inferior wall (HCC) 2004   Coronary artery disease    DES x2 to the RCA 2004, residual disease managed medically   Hyperlipidemia    Hypertension    Type 2 diabetes mellitus (HCC)     Past Surgical History:  Procedure Laterality Date   BIOPSY  01/28/2022   Procedure: BIOPSY;  Surgeon: Marguerita Merles, Reuel Boom, MD;  Location: [facility];  Service: Gastroenterology;;   CATARACT EXTRACTION Bilateral    CORONARY ANGIOPLASTY WITH STENT PLACEMENT  09/05/2002   stent RCA, 80% first diagonal, 70-80% mid-diagonal, 80% mid LAD stenosis   FLEXIBLE SIGMOIDOSCOPY N/A 01/28/2022   Procedure: FLEXIBLE SIGMOIDOSCOPY;  Surgeon: Dolores Frame, MD;  Location: [facility];  Service: Gastroenterology;  Laterality: N/A;   HYDROCELE EXCISION Bilateral 11/20/2021   Procedure: HYDROCELECTOMY ADULT;  Surgeon:  Milderd Meager., MD;  Location: [facility];  Service: Urology;  Laterality: Bilateral;   IMPACTION REMOVAL  01/28/2022   Procedure: IMPACTION REMOVAL;  Surgeon: Dolores Frame, MD;  Location: [facility];  Service: Gastroenterology;;   NM MYOCAR PERF WALL MOTION  11/01/2008   Normal    Home Medications:  Allergies as of 09/09/2022       Reactions   Clobetasol         Medication List        Accurate as of September 09, 2022  4:34 PM. If you have any questions, ask your nurse or doctor.          acetaminophen 325 MG tablet Commonly known as: TYLENOL Take 2 tablets (650 mg total) by mouth every 6 (six) hours as needed for mild pain (or Fever >/= 101).   aspirin EC 81 MG tablet Take 1 tablet (81 mg total) by mouth daily with breakfast. Swallow whole.   bisacodyl 10 MG suppository Commonly known as: DULCOLAX Place 1 suppository (10 mg total) rectally every Monday, Wednesday, and Friday.   carvedilol 6.25 MG tablet Commonly known as: COREG Take 1 tablet (6.25 mg total) by mouth 2 (two) times daily.   cholecalciferol 1000 units tablet Commonly known as: VITAMIN D Take 1,000 Units by mouth daily.   cyanocobalamin 1000 MCG tablet Commonly known as: VITAMIN B12 Take 1,000 mcg by mouth daily.   folic acid 1 MG tablet Commonly known as: FOLVITE Take 1 tablet (1 mg total) by mouth daily.   furosemide 20 MG tablet Commonly known  as: Lasix Take 1 tablet (20 mg total) by mouth daily.   latanoprost 0.005 % ophthalmic solution Commonly known as: XALATAN Place 1 drop into both eyes 2 (two) times daily.   polyethylene glycol 17 g packet Commonly known as: MIRALAX / GLYCOLAX Take 17 g by mouth 2 (two) times daily.   potassium chloride 10 MEQ tablet Commonly known as: KLOR-CON Take 1 tablet (10 mEq total) by mouth daily. Take While taking Lasix/furosemide   tamsulosin 0.4 MG Caps capsule Commonly known as: FLOMAX Take 1 capsule (0.4 mg total) by mouth  daily.   Travatan Z 0.004 % Soln ophthalmic solution Generic drug: Travoprost (BAK Free) Place 1 drop into both eyes 2 (two) times daily.        Allergies:  Allergies  Allergen Reactions   Clobetasol     Family History  Problem Relation Age of Onset   Hypertension Father    Diabetes Sister     Social History:  reports that he quit smoking about 16 years ago. His smoking use included cigarettes. He has never used smokeless tobacco. He reports that he does not currently use alcohol. He reports that he does not use drugs.  ROS: A complete review of systems was performed.  All systems are negative except for pertinent findings as noted.  Physical Exam:  Vital signs in last 24 hours: BP (!) 172/80   Pulse (!) 55  Constitutional:  Alert and oriented, No acute distress Cardiovascular: Regular rate  Respiratory: Normal respiratory effort GI: Abdomen is soft, nontender, nondistended, no abdominal masses. No CVAT.  Genitourinary: Scrotal skin edematous but not erythematous or tender.  He does have a very penis.  He does have erosion of his phimotic foreskin from catheter pressure. Lymphatic: No lymphadenopathy Neurologic: Grossly intact, no focal deficits Psychiatric: Normal mood and affect  I have reviewed prior pt notes  I have reviewed notes from recent emergency room visit/hospitalization  I have reviewed urine culture results  I have independently reviewed prior imaging-CT scan results from January 2024   Impression/Assessment:  1.  Mild erosion of penile foreskin from catheter trauma, not severe  2.  History of urinary retention, currently with catheter in place  3.  Recent UTI, treated  Plan:  1.  I did speak with the patient and his daughter about voiding trial-I think it worthwhile to do this before he goes into the convalescent center for PT.  She will call if they want a voiding trial.  2.  Reassurance regarding his penile appearance.  I do not think any  specific wound management is necessary  3.  If he decides to continue with a catheter, they will come here for catheter changes.

## 2022-09-16 ENCOUNTER — Ambulatory Visit: Payer: Self-pay | Admitting: *Deleted

## 2022-09-16 NOTE — Patient Outreach (Signed)
  Care Coordination   Case closure   Visit Note   09/16/2022 Name: Klyde Wiesel MRN: [id number] DOB: 05-29-1928  Mujahid Bartold is a 89 y.o. year old male who sees [name], DO for primary care. I  Care coordination CMA message  What matters to the patients health and wellness today?  Care coordination CMA unable to reach patient to schedule for at return call    Goals Addressed             This Visit's Progress    COMPLETED: [facility] care coordination services goal long term care facility placement       Interventions Today    Flowsheet Row Most Recent Value  Chronic Disease   Chronic disease during today's visit Other  [closed case Care coordination CMA unable to reach patient for a return call with RN CM]              SDOH assessments and interventions completed:  No     Care Coordination Interventions:  No, not indicated   Follow up plan: No further intervention required.   Encounter Outcome:  No Answer   Areyanna Figeroa L. Noelle Penner, RN, BSN, CCM, Care Management Coordinator [phone]

## 2022-09-24 DIAGNOSIS — R269 Unspecified abnormalities of gait and mobility: Secondary | ICD-10-CM | POA: Diagnosis not present

## 2022-09-24 DIAGNOSIS — M6281 Muscle weakness (generalized): Secondary | ICD-10-CM | POA: Diagnosis not present

## 2022-09-25 ENCOUNTER — Encounter: Payer: Medicare Other | Admitting: *Deleted

## 2022-10-07 ENCOUNTER — Ambulatory Visit (INDEPENDENT_AMBULATORY_CARE_PROVIDER_SITE_OTHER): Payer: Medicare Other

## 2022-10-07 DIAGNOSIS — R339 Retention of urine, unspecified: Secondary | ICD-10-CM

## 2022-10-07 NOTE — Progress Notes (Signed)
Cath Change/ Replacement  Patient is present today for a catheter change due to urinary retention.  10ml of water was removed from the balloon, a 16FR foley cath was removed without difficulty.  Patient was cleaned and prepped in a sterile fashion with betadine and 2% lidocaine jelly was instilled into the urethra. A 16 FR foley cath was replaced into the bladder, no complications were noted. Urine return was noted 30ml and urine was yellow in color. The balloon was filled with 10ml of sterile water. A bed bag was attached for drainage. Patient was given proper instruction on catheter care.    Performed by: Guss Bunde, CMA  Follow up: as scheduled.

## 2022-10-08 ENCOUNTER — Ambulatory Visit: Payer: Medicare Other

## 2022-10-15 ENCOUNTER — Ambulatory Visit: Payer: Medicaid Other | Admitting: Family Medicine

## 2022-10-24 DIAGNOSIS — R269 Unspecified abnormalities of gait and mobility: Secondary | ICD-10-CM | POA: Diagnosis not present

## 2022-10-24 DIAGNOSIS — M6281 Muscle weakness (generalized): Secondary | ICD-10-CM | POA: Diagnosis not present

## 2022-10-28 ENCOUNTER — Ambulatory Visit (INDEPENDENT_AMBULATORY_CARE_PROVIDER_SITE_OTHER): Payer: Medicare Other | Admitting: Podiatry

## 2022-10-28 ENCOUNTER — Encounter: Payer: Self-pay | Admitting: Podiatry

## 2022-10-28 DIAGNOSIS — N1832 Chronic kidney disease, stage 3b: Secondary | ICD-10-CM | POA: Diagnosis not present

## 2022-10-28 DIAGNOSIS — B351 Tinea unguium: Secondary | ICD-10-CM

## 2022-10-28 DIAGNOSIS — M79675 Pain in left toe(s): Secondary | ICD-10-CM | POA: Diagnosis not present

## 2022-10-28 DIAGNOSIS — M79674 Pain in right toe(s): Secondary | ICD-10-CM | POA: Diagnosis not present

## 2022-10-28 NOTE — Progress Notes (Signed)
This patient returns to my office for at risk foot care.  This patient requires this care by a professional since this patient will be at risk due to having kidney disease.  This patient is unable to cut nails himself since the patient cannot reach his nails.These nails are painful walking and wearing shoes.  This patient presents for at risk foot care today.  General Appearance  Alert, conversant and in no acute stress.  Vascular  Dorsalis pedis and posterior tibial  pulses are  not palpable  due to swelling. bilaterally.  Capillary return is within normal limits  bilaterally. Temperature is within normal limits  bilaterally.  Neurologic  Senn-Weinstein monofilament wire test within normal limits  bilaterally. Muscle power within normal limits bilaterally.  Nails Thick disfigured discolored nails with subungual debris  from hallux to fifth toes bilaterally. No evidence of bacterial infection or drainage bilaterally.  Orthopedic  No limitations of motion  feet .  No crepitus or effusions noted.  No bony pathology or digital deformities noted.  Skin  normotropic skin with no porokeratosis noted bilaterally.  No signs of infections or ulcers noted.     Onychomycosis  Pain in right toes  Pain in left toes  Consent was obtained for treatment procedures.   Mechanical debridement of nails 1-5  bilaterally performed with a nail nipper.  Filed with dremel without incident.    Return office visit    4 months                  Told patient to return for periodic foot care and evaluation due to potential at risk complications.   Helane Gunther DPM

## 2022-11-02 ENCOUNTER — Encounter: Payer: Self-pay | Admitting: Pharmacist

## 2022-11-02 NOTE — Progress Notes (Signed)
Pharmacy Quality Measure Review  This patient is appearing on a report for being at risk of failing the adherence measure for cholesterol (statin) medications this calendar year.   Medication: rosuvastatin 5 mg Last fill date: 7/11 for 90 day supply  Medication was discontinued by provider. No further action needed at this time.  Jarrett Ables, PharmD PGY-1 Pharmacy Resident

## 2022-11-17 ENCOUNTER — Ambulatory Visit (INDEPENDENT_AMBULATORY_CARE_PROVIDER_SITE_OTHER): Payer: Medicare Other

## 2022-11-17 ENCOUNTER — Ambulatory Visit: Payer: Medicare Other | Admitting: Urology

## 2022-11-17 ENCOUNTER — Telehealth: Payer: Self-pay

## 2022-11-17 DIAGNOSIS — R339 Retention of urine, unspecified: Secondary | ICD-10-CM | POA: Diagnosis not present

## 2022-11-17 NOTE — Progress Notes (Cosign Needed Addendum)
Cath Change/ Replacement  Patient is present today for a catheter change due to urinary retention.  10ml of water was removed from the balloon, a 16FR foley cath was removed without difficulty.  Patient was cleaned and prepped in a sterile fashion with betadine and 2% lidocaine jelly was instilled into the urethra. A 16 FR foley cath was replaced into the bladder, no complications were noted. Urine return was noted 50ml and urine was yellow in color. The balloon was filled with 10ml of sterile water. A bed bag was attached for drainage. Patient was given proper instruction on catheter care.    Performed by: Guss Bunde, CMA  Follow up: No follow-ups on file.

## 2022-11-17 NOTE — Telephone Encounter (Signed)
Daughter stated she requested patient to be seen by Dr. Ronne Binning for future appointments.

## 2022-11-18 NOTE — Telephone Encounter (Signed)
Ok please schedule follow up with Dr. Ronne Binning, please add daughter request to see Dr. Ronne Binning in notes.  Thanks!

## 2022-11-24 DIAGNOSIS — R269 Unspecified abnormalities of gait and mobility: Secondary | ICD-10-CM | POA: Diagnosis not present

## 2022-11-24 DIAGNOSIS — M6281 Muscle weakness (generalized): Secondary | ICD-10-CM | POA: Diagnosis not present

## 2022-11-25 ENCOUNTER — Ambulatory Visit: Payer: Medicare Other | Admitting: Family Medicine

## 2022-11-25 VITALS — BP 170/71 | HR 57 | Wt 211.0 lb

## 2022-11-25 DIAGNOSIS — R600 Localized edema: Secondary | ICD-10-CM

## 2022-11-25 DIAGNOSIS — I10 Essential (primary) hypertension: Secondary | ICD-10-CM

## 2022-11-25 MED ORDER — AMLODIPINE BESYLATE 2.5 MG PO TABS: 2.5000 mg | ORAL_TABLET | Freq: Every day | ORAL | 0 refills | Status: DC

## 2022-11-25 NOTE — Assessment & Plan Note (Signed)
Uncontrolled.  BP elevated here today.  Adding amlodipine.
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
## 2022-11-25 NOTE — Assessment & Plan Note (Signed)
Recommended compression and elevation.

## 2022-11-25 NOTE — Patient Instructions (Signed)
Medication as prescribed.  Follow up in 3 months.  Take care  Dr. Warren Kugelman 

## 2022-12-17 ENCOUNTER — Ambulatory Visit: Payer: Medicare Other

## 2022-12-19 ENCOUNTER — Encounter (HOSPITAL_COMMUNITY): Payer: Self-pay

## 2022-12-19 ENCOUNTER — Other Ambulatory Visit: Payer: Self-pay

## 2022-12-19 ENCOUNTER — Inpatient Hospital Stay (HOSPITAL_COMMUNITY)
Admission: EM | Admit: 2022-12-19 | Discharge: 2022-12-21 | DRG: 698 | Disposition: A | Discharge status: Home / Self Care | Payer: Medicare Other | Attending: Internal Medicine | Admitting: Internal Medicine

## 2022-12-19 DIAGNOSIS — D539 Nutritional anemia, unspecified: Secondary | ICD-10-CM | POA: Diagnosis not present

## 2022-12-19 DIAGNOSIS — I272 Pulmonary hypertension, unspecified: Secondary | ICD-10-CM | POA: Diagnosis not present

## 2022-12-19 DIAGNOSIS — E1122 Type 2 diabetes mellitus with diabetic chronic kidney disease: Secondary | ICD-10-CM | POA: Diagnosis not present

## 2022-12-19 DIAGNOSIS — Z66 Do not resuscitate: Secondary | ICD-10-CM | POA: Diagnosis not present

## 2022-12-19 DIAGNOSIS — N179 Acute kidney failure, unspecified: Secondary | ICD-10-CM | POA: Diagnosis not present

## 2022-12-19 DIAGNOSIS — N3000 Acute cystitis without hematuria: Secondary | ICD-10-CM | POA: Diagnosis not present

## 2022-12-19 DIAGNOSIS — H409 Unspecified glaucoma: Secondary | ICD-10-CM | POA: Diagnosis not present

## 2022-12-19 DIAGNOSIS — Z9842 Cataract extraction status, left eye: Secondary | ICD-10-CM

## 2022-12-19 DIAGNOSIS — Z794 Long term (current) use of insulin: Secondary | ICD-10-CM | POA: Diagnosis not present

## 2022-12-19 DIAGNOSIS — I10 Essential (primary) hypertension: Secondary | ICD-10-CM | POA: Diagnosis present

## 2022-12-19 DIAGNOSIS — Z7982 Long term (current) use of aspirin: Secondary | ICD-10-CM | POA: Diagnosis not present

## 2022-12-19 DIAGNOSIS — I252 Old myocardial infarction: Secondary | ICD-10-CM

## 2022-12-19 DIAGNOSIS — Z466 Encounter for fitting and adjustment of urinary device: Secondary | ICD-10-CM

## 2022-12-19 DIAGNOSIS — I2510 Atherosclerotic heart disease of native coronary artery without angina pectoris: Secondary | ICD-10-CM | POA: Diagnosis not present

## 2022-12-19 DIAGNOSIS — Z888 Allergy status to other drugs, medicaments and biological substances status: Secondary | ICD-10-CM

## 2022-12-19 DIAGNOSIS — R001 Bradycardia, unspecified: Secondary | ICD-10-CM | POA: Diagnosis not present

## 2022-12-19 DIAGNOSIS — E785 Hyperlipidemia, unspecified: Secondary | ICD-10-CM | POA: Diagnosis not present

## 2022-12-19 DIAGNOSIS — I129 Hypertensive chronic kidney disease with stage 1 through stage 4 chronic kidney disease, or unspecified chronic kidney disease: Secondary | ICD-10-CM | POA: Diagnosis not present

## 2022-12-19 DIAGNOSIS — Z955 Presence of coronary angioplasty implant and graft: Secondary | ICD-10-CM

## 2022-12-19 DIAGNOSIS — G9341 Metabolic encephalopathy: Secondary | ICD-10-CM | POA: Diagnosis not present

## 2022-12-19 DIAGNOSIS — Z9841 Cataract extraction status, right eye: Secondary | ICD-10-CM

## 2022-12-19 DIAGNOSIS — D472 Monoclonal gammopathy: Secondary | ICD-10-CM | POA: Diagnosis not present

## 2022-12-19 DIAGNOSIS — N1832 Chronic kidney disease, stage 3b: Secondary | ICD-10-CM | POA: Diagnosis present

## 2022-12-19 DIAGNOSIS — N138 Other obstructive and reflux uropathy: Secondary | ICD-10-CM | POA: Diagnosis not present

## 2022-12-19 DIAGNOSIS — N401 Enlarged prostate with lower urinary tract symptoms: Secondary | ICD-10-CM | POA: Diagnosis present

## 2022-12-19 DIAGNOSIS — Z833 Family history of diabetes mellitus: Secondary | ICD-10-CM

## 2022-12-19 DIAGNOSIS — N390 Urinary tract infection, site not specified: Secondary | ICD-10-CM | POA: Diagnosis not present

## 2022-12-19 DIAGNOSIS — Z8249 Family history of ischemic heart disease and other diseases of the circulatory system: Secondary | ICD-10-CM

## 2022-12-19 DIAGNOSIS — R443 Hallucinations, unspecified: Secondary | ICD-10-CM | POA: Diagnosis present

## 2022-12-19 DIAGNOSIS — T83511A Infection and inflammatory reaction due to indwelling urethral catheter, initial encounter: Secondary | ICD-10-CM | POA: Diagnosis not present

## 2022-12-19 DIAGNOSIS — Z79899 Other long term (current) drug therapy: Secondary | ICD-10-CM

## 2022-12-19 DIAGNOSIS — Z87891 Personal history of nicotine dependence: Secondary | ICD-10-CM

## 2022-12-19 DIAGNOSIS — Y846 Urinary catheterization as the cause of abnormal reaction of the patient, or of later complication, without mention of misadventure at the time of the procedure: Secondary | ICD-10-CM | POA: Diagnosis present

## 2022-12-19 DIAGNOSIS — T68XXXA Hypothermia, initial encounter: Secondary | ICD-10-CM | POA: Diagnosis present

## 2022-12-19 DIAGNOSIS — N1831 Chronic kidney disease, stage 3a: Secondary | ICD-10-CM | POA: Diagnosis not present

## 2022-12-19 LAB — BASIC METABOLIC PANEL
Anion gap: 9 (ref 5–15)
BUN: 29 mg/dL — ABNORMAL HIGH (ref 8–23)
CO2: 21 mmol/L — ABNORMAL LOW (ref 22–32)
Calcium: 9 mg/dL (ref 8.9–10.3)
Chloride: 113 mmol/L — ABNORMAL HIGH (ref 98–111)
Creatinine, Ser: 1.82 mg/dL — ABNORMAL HIGH (ref 0.61–1.24)
GFR, Estimated: 34 mL/min — ABNORMAL LOW (ref >60)
Glucose, Bld: 111 mg/dL — ABNORMAL HIGH (ref 70–99)
Potassium: 3.8 mmol/L (ref 3.5–5.1)
Sodium: 143 mmol/L (ref 135–145)

## 2022-12-19 LAB — URINALYSIS, W/ REFLEX TO CULTURE (INFECTION SUSPECTED)
Bilirubin Urine: NEGATIVE
Glucose, UA: NEGATIVE mg/dL
Ketones, ur: NEGATIVE mg/dL
Nitrite: NEGATIVE
Protein, ur: 30 mg/dL — AB
Specific Gravity, Urine: 1.015 (ref 1.005–1.030)
WBC, UA: >50 WBC/hpf (ref 0–5)
pH: 5 (ref 5.0–8.0)

## 2022-12-19 LAB — URINALYSIS, ROUTINE W REFLEX MICROSCOPIC
Bilirubin Urine: NEGATIVE
Glucose, UA: NEGATIVE mg/dL
Ketones, ur: NEGATIVE mg/dL
Nitrite: POSITIVE — AB
Protein, ur: 100 mg/dL — AB
Specific Gravity, Urine: 1.016 (ref 1.005–1.030)
WBC, UA: >50 WBC/hpf (ref 0–5)
pH: 5 (ref 5.0–8.0)

## 2022-12-19 LAB — CBC
HCT: 28.1 % — ABNORMAL LOW (ref 39.0–52.0)
Hemoglobin: 9.2 g/dL — ABNORMAL LOW (ref 13.0–17.0)
MCH: 33.8 pg (ref 26.0–34.0)
MCHC: 32.7 g/dL (ref 30.0–36.0)
MCV: 103.3 fL — ABNORMAL HIGH (ref 80.0–100.0)
Platelets: 113 10*3/uL — ABNORMAL LOW (ref 150–400)
RBC: 2.72 MIL/uL — ABNORMAL LOW (ref 4.22–5.81)
RDW: 28 % — ABNORMAL HIGH (ref 11.5–15.5)
WBC: 5.8 10*3/uL (ref 4.0–10.5)
nRBC: 2.7 % — ABNORMAL HIGH (ref 0.0–0.2)

## 2022-12-19 MED ORDER — SODIUM CHLORIDE 0.9 % IV SOLN
1.0000 g | Freq: Once | INTRAVENOUS | Status: AC
  Administered 2022-12-19: 1 g via INTRAVENOUS
  Filled 2022-12-19: qty 10

## 2022-12-19 NOTE — H&P (Incomplete)
History and Physical    Patient: Terrance Miller BMW:[id number] DOB: [birth year] DOA: 12/19/2022 DOS: the patient was seen and examined on 12/20/2022 PCP: [name], DO  Patient coming from: Home  Chief Complaint:  Chief Complaint  Patient presents with   Dysuria   HPI: Terrance Miller is a 89 y.o. male with medical history significant hypertension, T2DM, MGUS, CAD s/p stent placement, chronic indwelling Foley catheter who presents to the emergency department due to hallucination whereby patient sees trees through the wall and this was reported to be usual for patient when he has UTI.  Last Foley catheter was placed on 11/11.  There was no report of fever, chills, chest pain, shortness of breath. Patient was unable to provide history due to level 5 caveat secondary to AMS  ED Course:  In the emergency department, BP was 177/103, patient was bradycardic, other vital signs are within normal range.  Workup in the ED showed macrocytic anemia, BMP showed sodium 143, potassium 3.8, chloride 113, bicarb 21, blood glucose 111, BUN 29, creatinine 1.82 (baseline creatinine at 1.1-1.4).  Urinalysis was suggestive of UTI. Patient was started on IV ceftriaxone.  Hospitalist was asked to admit patient for further evaluation and management.  Review of Systems: Review of systems as noted in the HPI. All other systems reviewed and are negative.   Past Medical History:  Diagnosis Date   Acute ST elevation myocardial infarction (STEMI) of inferior wall (HCC) 2004   Coronary artery disease    DES x2 to the RCA 2004, residual disease managed medically   Hyperlipidemia    Hypertension    Type 2 diabetes mellitus (HCC)    Past Surgical History:  Procedure Laterality Date   BIOPSY  01/28/2022   Procedure: BIOPSY;  Surgeon: Marguerita Merles, Reuel Boom, MD;  Location: [facility];  Service: Gastroenterology;;   CATARACT EXTRACTION Bilateral    CORONARY ANGIOPLASTY WITH STENT PLACEMENT  09/05/2002   stent  RCA, 80% first diagonal, 70-80% mid-diagonal, 80% mid LAD stenosis   FLEXIBLE SIGMOIDOSCOPY N/A 01/28/2022   Procedure: FLEXIBLE SIGMOIDOSCOPY;  Surgeon: Dolores Frame, MD;  Location: [facility];  Service: Gastroenterology;  Laterality: N/A;   HYDROCELE EXCISION Bilateral 11/20/2021   Procedure: HYDROCELECTOMY ADULT;  Surgeon: Milderd Meager., MD;  Location: [facility];  Service: Urology;  Laterality: Bilateral;   IMPACTION REMOVAL  01/28/2022   Procedure: IMPACTION REMOVAL;  Surgeon: Dolores Frame, MD;  Location: [facility];  Service: Gastroenterology;;   NM MYOCAR PERF WALL MOTION  11/01/2008   Normal    Social History:  reports that he quit smoking about 17 years ago. His smoking use included cigarettes. He has never used smokeless tobacco. He reports that he does not currently use alcohol. He reports that he does not use drugs.   Allergies  Allergen Reactions   Clobetasol     Family History  Problem Relation Age of Onset   Hypertension Father    Diabetes Sister      Prior to Admission medications   Medication Sig Start Date End Date Taking? Authorizing Provider  acetaminophen (TYLENOL) 325 MG tablet Take 2 tablets (650 mg total) by mouth every 6 (six) hours as needed for mild pain (or Fever >/= 101). 08/02/22   Emokpae, Courage, MD  amLODipine (NORVASC) 2.5 MG tablet Take 1 tablet (2.5 mg total) by mouth daily. 11/25/22   [name], DO  aspirin EC 81 MG tablet Take 1 tablet (81 mg total) by mouth daily with  breakfast. Swallow whole. 08/02/22   Shon Hale, MD  bisacodyl (DULCOLAX) 10 MG suppository Place 1 suppository (10 mg total) rectally every Monday, Wednesday, and Friday. 01/31/22   Shon Hale, MD  carvedilol (COREG) 6.25 MG tablet Take 1 tablet (6.25 mg total) by mouth 2 (two) times daily. 08/20/22   Sharee Holster, NP  cholecalciferol (VITAMIN D) 1000 units tablet Take 1,000 Units by mouth daily.    [provider]   cyanocobalamin (VITAMIN B12) 1000 MCG tablet Take 1,000 mcg by mouth daily.    [provider]  folic acid (FOLVITE) 1 MG tablet Take 1 tablet (1 mg total) by mouth daily. 08/20/22   Sharee Holster, NP  furosemide (LASIX) 20 MG tablet Take 1 tablet (20 mg total) by mouth daily. 08/25/22 08/25/23  [name], DO  latanoprost (XALATAN) 0.005 % ophthalmic solution Place 1 drop into both eyes 2 (two) times daily. 08/20/22   Sharee Holster, NP  polyethylene glycol (MIRALAX / GLYCOLAX) 17 g packet Take 17 g by mouth 2 (two) times daily. 01/31/22   Shon Hale, MD  potassium chloride (KLOR-CON) 10 MEQ tablet Take 1 tablet (10 mEq total) by mouth daily. Take While taking Lasix/furosemide 08/20/22   Sharee Holster, NP  tamsulosin (FLOMAX) 0.4 MG CAPS capsule Take 1 capsule (0.4 mg total) by mouth daily. 08/20/22   Sharee Holster, NP  TRAVATAN Z 0.004 % SOLN ophthalmic solution Place 1 drop into both eyes 2 (two) times daily. 08/20/22   Sharee Holster, NP    Physical Exam: BP (!) 158/75 (BP Location: Right Arm)   Pulse 61   Temp (!) 95.1 F (35.1 C) (Rectal)   Resp 14   Ht 6' (1.829 m)   Wt 99.8 kg   SpO2 97%   BMI 29.84 kg/m   General: 89 y.o. year-old male well developed well nourished in no acute distress.   HEENT: NCAT, EOMI Neck: Supple, trachea medial Cardiovascular: Bradycardia.  Regular rate and rhythm with no rubs or gallops.  No thyromegaly or JVD noted.  +2 lower extremity edema bilaterally. 2/4 pulses in all 4 extremities. Respiratory: Clear to auscultation with no wheezes or rales. Good inspiratory effort. Abdomen: Soft, nontender nondistended with normal bowel sounds x4 quadrants. Muskuloskeletal: No cyanosis, clubbing noted bilaterally Neuro: Awake, alert and oriented x 1 (self).  Moving all extremities, no focal neurologic deficit.   Skin: No ulcerative lesions noted or rashes Psychiatry: Mood is appropriate for condition and setting          Labs on  Admission:  Basic Metabolic Panel: Recent Labs  Lab 12/19/22 1613  NA 143  K 3.8  CL 113*  CO2 21*  GLUCOSE 111*  BUN 29*  CREATININE 1.82*  CALCIUM 9.0   Liver Function Tests: No results for input(s): "AST", "ALT", "ALKPHOS", "BILITOT", "PROT", "ALBUMIN" in the last 168 hours. No results for input(s): "LIPASE", "AMYLASE" in the last 168 hours. No results for input(s): "AMMONIA" in the last 168 hours. CBC: Recent Labs  Lab 12/19/22 1613  WBC 5.8  HGB 9.2*  HCT 28.1*  MCV 103.3*  PLT 113*   Cardiac Enzymes: No results for input(s): "CKTOTAL", "CKMB", "CKMBINDEX", "TROPONINI" in the last 168 hours.  BNP (last 3 results) No results for input(s): "BNP" in the last 8760 hours.  ProBNP (last 3 results) No results for input(s): "PROBNP" in the last 8760 hours.  CBG: No results for input(s): "GLUCAP" in the last 168 hours.  Radiological  Exams on Admission: No results found.  EKG: I independently viewed the EKG done and my findings are as followed: EKG was not done in the ED  Assessment/Plan Present on Admission:  Acute metabolic encephalopathy  UTI (urinary tract infection) due to urinary indwelling Foley catheter (HCC)  Acute kidney injury superimposed on stage 3b chronic kidney disease (HCC)  Hypothermia  Macrocytic anemia  Essential hypertension  BPH with obstruction/lower urinary tract symptoms  Active Problems:   Essential hypertension   Hypothermia   Macrocytic anemia   Acute kidney injury superimposed on stage 3b chronic kidney disease (HCC)   UTI (urinary tract infection) due to urinary indwelling Foley catheter (HCC)   BPH with obstruction/lower urinary tract symptoms   Acute metabolic encephalopathy   Type 2 diabetes mellitus with chronic kidney disease, with long-term current use of insulin (HCC)   Glaucoma  Acute metabolic encephalopathy possibly secondary to UTI POA Patient was said to have catheter associated UTI Foley catheter was  changed Urine culture done on 07/29/2022 was positive for Klebsiella pneumonia which was sensitive to ceftriaxone Patient was started on IV ceftriaxone, we shall continue same at this time Urine culture pending  Acute kidney injury on CKD 3B Creatinine 1.82 (baseline creatinine at 1.1-1.4). Continue IV hydration Renally adjust medications, avoid nephrotoxic agents/dehydration/hypotension  Hypothermia No known cause at this time Continue warm blanket TSH will be checked  Macrocytic anemia MCV 103.3, vitamin B12 and folate levels will be checked Continue folic acid, vitamin B12  Type 2 diabetes mellitus Last hemoglobin A1c on record was 6.3 in April 2023 (prediabetic range) Continue diet and lifestyle modification  Essential hypertension Continue amlodipine, Coreg  BPH Continue Flomax per home regimen  Glaucoma Continue latanoprost  DVT prophylaxis: Lovenox  Code Status: DNR  Family Communication: None at bedside  Consults: None  Severity of Illness: The appropriate patient status for this patient is INPATIENT. Inpatient status is judged to be reasonable and necessary in order to provide the required intensity of service to ensure the patient's safety. The patient's presenting symptoms, physical exam findings, and initial radiographic and laboratory data in the context of their chronic comorbidities is felt to place them at high risk for further clinical deterioration. Furthermore, it is not anticipated that the patient will be medically stable for discharge from the hospital within 2 midnights of admission.   * I certify that at the point of admission it is my clinical judgment that the patient will require inpatient hospital care spanning beyond 2 midnights from the point of admission due to high intensity of service, high risk for further deterioration and high frequency of surveillance required.*  Author: Frankey Shown, DO 12/20/2022 2:48 AM  For on call review  [email or web address].

## 2022-12-19 NOTE — ED Triage Notes (Signed)
Pt with a chronic foley for the past several years, recent foley placed 11/22/22.  Pt daughter reports pt started hallucinating yesterday seeing trees through the wall which is usual for him when he has a UTI.

## 2022-12-19 NOTE — ED Provider Notes (Signed)
[facility] AT [facility] Provider Note   CSN: [id number] Arrival date & time: 12/19/22  1535     History  Chief Complaint  Patient presents with   Dysuria    Terrance Miller is a 89 y.o. male with history of hypertension, CAD, hyperlipidemia, type 2 diabetes, with chronic Foley catheter who presents to the emergency department with concern for hallucinations.  Per daughter, most recent Foley was placed on 11/11.  She states that patient started hallucinating yesterday and "seeing trees through the wall", which is normal for him when he has a UTI.  Level 5 caveat due to AMS   Dysuria Presenting symptoms: dysuria        Home Medications Prior to Admission medications   Medication Sig Start Date End Date Taking? Authorizing Provider  acetaminophen (TYLENOL) 325 MG tablet Take 2 tablets (650 mg total) by mouth every 6 (six) hours as needed for mild pain (or Fever >/= 101). 08/02/22   Emokpae, Courage, MD  amLODipine (NORVASC) 2.5 MG tablet Take 1 tablet (2.5 mg total) by mouth daily. 11/25/22   [name], DO  aspirin EC 81 MG tablet Take 1 tablet (81 mg total) by mouth daily with breakfast. Swallow whole. 08/02/22   Shon Hale, MD  bisacodyl (DULCOLAX) 10 MG suppository Place 1 suppository (10 mg total) rectally every Monday, Wednesday, and Friday. 01/31/22   Shon Hale, MD  carvedilol (COREG) 6.25 MG tablet Take 1 tablet (6.25 mg total) by mouth 2 (two) times daily. 08/20/22   Sharee Holster, NP  cholecalciferol (VITAMIN D) 1000 units tablet Take 1,000 Units by mouth daily.    [provider]  cyanocobalamin (VITAMIN B12) 1000 MCG tablet Take 1,000 mcg by mouth daily.    [provider]  folic acid (FOLVITE) 1 MG tablet Take 1 tablet (1 mg total) by mouth daily. 08/20/22   Sharee Holster, NP  furosemide (LASIX) 20 MG tablet Take 1 tablet (20 mg total) by mouth daily. 08/25/22 08/25/23  [name], DO  latanoprost  (XALATAN) 0.005 % ophthalmic solution Place 1 drop into both eyes 2 (two) times daily. 08/20/22   Sharee Holster, NP  polyethylene glycol (MIRALAX / GLYCOLAX) 17 g packet Take 17 g by mouth 2 (two) times daily. 01/31/22   Shon Hale, MD  potassium chloride (KLOR-CON) 10 MEQ tablet Take 1 tablet (10 mEq total) by mouth daily. Take While taking Lasix/furosemide 08/20/22   Sharee Holster, NP  tamsulosin (FLOMAX) 0.4 MG CAPS capsule Take 1 capsule (0.4 mg total) by mouth daily. 08/20/22   Sharee Holster, NP  TRAVATAN Z 0.004 % SOLN ophthalmic solution Place 1 drop into both eyes 2 (two) times daily. 08/20/22   Sharee Holster, NP      Allergies    Clobetasol    Review of Systems   Review of Systems  Genitourinary:  Positive for dysuria.  Psychiatric/Behavioral:  Positive for hallucinations.     Physical Exam Updated Vital Signs BP (!) 176/73   Pulse (!) 47   Temp 98 F (36.7 C)   Resp 16   Ht 5\' 7"  (1.702 m)   Wt 95.7 kg   SpO2 96%   BMI 33.04 kg/m  Physical Exam Vitals and nursing note reviewed.  Constitutional:      Appearance: Normal appearance.  HENT:     Head: Normocephalic and atraumatic.  Eyes:     Conjunctiva/sclera: Conjunctivae normal.  Cardiovascular:  Rate and Rhythm: Normal rate and regular rhythm.  Pulmonary:     Effort: Pulmonary effort is normal. No respiratory distress.     Breath sounds: Normal breath sounds.  Abdominal:     General: There is no distension.     Palpations: Abdomen is soft.     Tenderness: There is no abdominal tenderness.  Musculoskeletal:     Right lower leg: 2+ Pitting Edema present.     Left lower leg: 2+ Pitting Edema present.  Skin:    General: Skin is warm and dry.     Comments: Chronic lower leg skin changes  Neurological:     General: No focal deficit present.     Mental Status: He is alert.     Comments: Neuro: Speech is clear, able to follow commands. CN III-XII intact grossly intact. PERRLA. EOMI. Sensation  intact throughout. Str 5/5 all extremities.  Psychiatric:        Attention and Perception: He perceives visual hallucinations.    ED Results / Procedures / Treatments   Labs (all labs ordered are listed, but only abnormal results are displayed) Labs Reviewed  BASIC METABOLIC PANEL - Abnormal; Notable for the following components:      Result Value   Chloride 113 (*)    CO2 21 (*)    Glucose, Bld 111 (*)    BUN 29 (*)    Creatinine, Ser 1.82 (*)    GFR, Estimated 34 (*)    All other components within normal limits  CBC - Abnormal; Notable for the following components:   RBC 2.72 (*)    Hemoglobin 9.2 (*)    HCT 28.1 (*)    MCV 103.3 (*)    RDW 28.0 (*)    Platelets 113 (*)    nRBC 2.7 (*)    All other components within normal limits  URINALYSIS, ROUTINE W REFLEX MICROSCOPIC - Abnormal; Notable for the following components:   APPearance CLOUDY (*)    Hgb urine dipstick SMALL (*)    Protein, ur 100 (*)    Nitrite POSITIVE (*)    Leukocytes,Ua LARGE (*)    Bacteria, UA RARE (*)    All other components within normal limits  URINALYSIS, W/ REFLEX TO CULTURE (INFECTION SUSPECTED) - Abnormal; Notable for the following components:   APPearance CLOUDY (*)    Hgb urine dipstick SMALL (*)    Protein, ur 30 (*)    Leukocytes,Ua LARGE (*)    Bacteria, UA RARE (*)    All other components within normal limits  URINE CULTURE    EKG None  Radiology No results found.  Procedures Procedures    Medications Ordered in ED Medications  cefTRIAXone (ROCEPHIN) 1 g in sodium chloride 0.9 % 100 mL IVPB (1 g Intravenous New Bag/Given 12/19/22 2112)    ED Course/ Medical Decision Making/ A&P                                 Medical Decision Making Amount and/or Complexity of Data Reviewed Labs: ordered.  Risk Decision regarding hospitalization.  This patient is a 89 y.o. male  who presents to the ED for concern of hallucinations.   Differential diagnoses prior to  evaluation: The emergent differential diagnosis includes, but is not limited to,  Drug-related, hypoxia, hyper/hypoglycemia, encephalopathy, sepsis, DKA/HHS, brain lesion, CVA, seizure, environmental, psychiatric. This is not an exhaustive differential.   Past Medical History / Co-morbidities /  Social History: hypertension, CAD, hyperlipidemia, type 2 diabetes, with chronic Foley catheter  Additional history: Chart reviewed. Pertinent results include: Pt follows with [facility] [place], hx of klebsiella UTI  Physical Exam: Physical exam performed. The pertinent findings include: Hypertensive, otherwise normal vital signs.  No acute distress.  Heart regular rate and rhythm, lung sounds clear.  Abdomen soft and nontender.  Foley catheter in place.  Lab Tests/Imaging studies: I personally interpreted labs/imaging and the pertinent results include: CBC stable compared to prior, BMP with mildly elevated creatinine, GFR 34, overall fairly stable compared to prior.  Urinalysis with small hemoglobin, positive nitrites, large leukocytes, over 50 WBCs, and rare bacteria, with white blood cell clumps.  Medications: I ordered medication including Rocephin.  I have reviewed the patients home medicines and have made adjustments as needed.  Consultations obtained: I consulted with hospitalist Dr. Thomes Dinning who will admit   Disposition: After consideration of the diagnostic results and the patients response to treatment, I feel that patient would benefit from medical admission for altered mental status and hallucinations, likely brought on by urinary tract infection.  Foley catheter was exchanged and new sample still significant for UTI, culture sent and pending.  I discussed with the daughter about discharge on antibiotics versus admission, and she feels that he is too confused/different from his baseline to be safe at home.  I think this is reasonable.  Final Clinical Impression(s) / ED  Diagnoses Final diagnoses:  Acute cystitis without hematuria  Indwelling catheter replaced  Hallucinations    Rx / DC Orders ED Discharge Orders     None      Portions of this report may have been transcribed using voice recognition software. Every effort was made to ensure accuracy; however, inadvertent computerized transcription errors may be present.    Jeanella Flattery 12/19/22 2221    Vanetta Mulders, MD 12/22/22 1115

## 2022-12-19 NOTE — ED Notes (Signed)
ED TO INPATIENT HANDOFF REPORT  ED Nurse Name and Phone #: Cat  S Name/Age/Gender Terrance Miller 89 y.o. male Room/Bed: APA15/APA15  Code Status   Code Status: Prior  Home/SNF/Other Home Patient oriented to: self Is this baseline? Yes   Triage Complete: Triage complete  Chief Complaint Acute metabolic encephalopathy [G93.41]  Triage Note Pt with a chronic foley for the past several years, recent foley placed 11/22/22.  Pt daughter reports pt started hallucinating yesterday seeing trees through the wall which is usual for him when he has a UTI.   Allergies Allergies  Allergen Reactions   Clobetasol     Level of Care/Admitting Diagnosis ED Disposition     ED Disposition  Admit   Condition  --   Comment  Hospital Area: [facility] [[id number]]  Level of Care: Telemetry [5]  Covid Evaluation: Asymptomatic - no recent exposure (last 10 days) testing not required  Diagnosis: Acute metabolic encephalopathy [1610960]  Admitting Physician: Frankey Shown [[id number]]  Attending Physician: Frankey Shown [[id number]]  Certification:: I certify this patient will need inpatient services for at least 2 midnights  Expected Medical Readiness: 12/22/2022          B Medical/Surgery History Past Medical History:  Diagnosis Date   Acute ST elevation myocardial infarction (STEMI) of inferior wall (HCC) 2004   Coronary artery disease    DES x2 to the RCA 2004, residual disease managed medically   Hyperlipidemia    Hypertension    Type 2 diabetes mellitus (HCC)    Past Surgical History:  Procedure Laterality Date   BIOPSY  01/28/2022   Procedure: BIOPSY;  Surgeon: Marguerita Merles, Reuel Boom, MD;  [facility];  Service: Gastroenterology;;   CATARACT EXTRACTION Bilateral    CORONARY ANGIOPLASTY WITH STENT PLACEMENT  09/05/2002   stent RCA, 80% first diagonal, 70-80% mid-diagonal, 80% mid LAD stenosis   FLEXIBLE SIGMOIDOSCOPY N/A 01/28/2022   Procedure:  FLEXIBLE SIGMOIDOSCOPY;  Surgeon: Dolores Frame, MD;  Location[facility];  Service: Gastroenterology;  Laterality: N/A;   HYDROCELE EXCISION Bilateral 11/20/2021   Procedure: HYDROCELECTOMY ADULT;  Surgeon: Milderd Meager., MD;  Location: [facility];  Service: Urology;  Laterality: Bilateral;   IMPACTION REMOVAL  01/28/2022   Procedure: IMPACTION REMOVAL;  Surgeon: Dolores Frame, MD;  Location[facility];  Service: Gastroenterology;;   NM MYOCAR PERF WALL MOTION  11/01/2008   Normal     A IV Location/Drains/Wounds Patient Lines/Drains/Airways Status     Active Line/Drains/Airways     Name Placement date Placement time Site Days   Urethral Catheter Cat Aiyden Lauderback Coude 16 Fr. 12/19/22  1959  Coude  less than 1   Pressure Injury 01/28/22 Scrotum Left;Right;Posterior Stage 2 -  Partial thickness loss of dermis presenting as a shallow open injury with a red, pink wound bed without slough. Partial thickness 01/28/22  0040  -- 325   Pressure Injury 07/30/22 Scrotum Stage 2 -  Partial thickness loss of dermis presenting as a shallow open injury with a red, pink wound bed without slough. 07/30/22  0028  -- 142   Pressure Injury 08/01/22 Buttocks Right;Left Stage 2 -  Partial thickness loss of dermis presenting as a shallow open injury with a red, pink wound bed without slough. small, shallow areas bilateral buttocks near rectum 08/01/22  0900  -- 140            Intake/Output Last 24 hours No intake or output data in the 24 hours ending  12/19/22 2252  Labs/Imaging Results for orders placed or performed during the hospital encounter of 12/19/22 (from the past 48 hours)  Basic metabolic panel     Status: Abnormal   Collection Time: 12/19/22  4:13 PM  Result Value Ref Range   Sodium 143 135 - 145 mmol/L   Potassium 3.8 3.5 - 5.1 mmol/L   Chloride 113 (H) 98 - 111 mmol/L   CO2 21 (L) 22 - 32 mmol/L   Glucose, Bld 111 (H) 70 - 99 mg/dL    Comment: Glucose  reference range applies only to samples taken after fasting for at least 8 hours.   BUN 29 (H) 8 - 23 mg/dL   Creatinine, Ser 0.86 (H) 0.61 - 1.24 mg/dL   Calcium 9.0 8.9 - 57.8 mg/dL   GFR, Estimated 34 (L) >60 mL/min    Comment: (NOTE) Calculated using the CKD-EPI Creatinine Equation (2021)    Anion gap 9 5 - 15    Comment: Performed at [facility], [address] [place], [place] [address]  CBC     Status: Abnormal   Collection Time: 12/19/22  4:13 PM  Result Value Ref Range   WBC 5.8 4.0 - 10.5 K/uL   RBC 2.72 (L) 4.22 - 5.81 MIL/uL   Hemoglobin 9.2 (L) 13.0 - 17.0 g/dL   HCT 95.2 (L) 84.1 - 32.4 %   MCV 103.3 (H) 80.0 - 100.0 fL   MCH 33.8 26.0 - 34.0 pg   MCHC 32.7 30.0 - 36.0 g/dL   RDW 40.1 (H) 02.7 - 25.3 %   Platelets 113 (L) 150 - 400 K/uL   nRBC 2.7 (H) 0.0 - 0.2 %    Comment: Performed at [facility], [address], [place], [place] [address]  Urinalysis, Routine w reflex microscopic -Urine, Catheterized; Indwelling urinary catheter     Status: Abnormal   Collection Time: 12/19/22  6:29 PM  Result Value Ref Range   Color, Urine YELLOW YELLOW   APPearance CLOUDY (A) CLEAR   Specific Gravity, Urine 1.016 1.005 - 1.030   pH 5.0 5.0 - 8.0   Glucose, UA NEGATIVE NEGATIVE mg/dL   Hgb urine dipstick SMALL (A) NEGATIVE   Bilirubin Urine NEGATIVE NEGATIVE   Ketones, ur NEGATIVE NEGATIVE mg/dL   Protein, ur 347 (A) NEGATIVE mg/dL   Nitrite POSITIVE (A) NEGATIVE   Leukocytes,Ua LARGE (A) NEGATIVE   RBC / HPF 21-50 0 - 5 RBC/hpf   WBC, UA >50 0 - 5 WBC/hpf   Bacteria, UA RARE (A) NONE SEEN   Squamous Epithelial / HPF 0-5 0 - 5 /HPF   WBC Clumps PRESENT    Mucus PRESENT    Budding Yeast PRESENT     Comment: Performed at [facility], [address], [place], [place] [address]  Urinalysis, w/ Reflex to Culture (Infection Suspected) -Urine, Catheterized; Indwelling urinary catheter     Status: Abnormal   Collection Time: 12/19/22  8:03 PM  Result Value Ref Range    Specimen Source URINE, CLEAN CATCH    Color, Urine YELLOW YELLOW   APPearance CLOUDY (A) CLEAR   Specific Gravity, Urine 1.015 1.005 - 1.030   pH 5.0 5.0 - 8.0   Glucose, UA NEGATIVE NEGATIVE mg/dL   Hgb urine dipstick SMALL (A) NEGATIVE   Bilirubin Urine NEGATIVE NEGATIVE   Ketones, ur NEGATIVE NEGATIVE mg/dL   Protein, ur 30 (A) NEGATIVE mg/dL   Nitrite NEGATIVE NEGATIVE   Leukocytes,Ua LARGE (A) NEGATIVE   RBC / HPF 21-50  0 - 5 RBC/hpf   WBC, UA >50 0 - 5 WBC/hpf    Comment:        Reflex urine culture not performed if WBC <=10, OR if Squamous epithelial cells >5. If Squamous epithelial cells >5 suggest recollection.    Bacteria, UA RARE (A) NONE SEEN   Squamous Epithelial / HPF 0-5 0 - 5 /HPF   WBC Clumps PRESENT    Mucus PRESENT     Comment: Performed at [facility], [address], [place], [place] [address]   No results found.  Pending Labs Unresulted Labs (From admission, onward)     Start     Ordered   12/19/22 2003  Urine Culture  Once,   R        12/19/22 2003            Vitals/Pain Today's Vitals   12/19/22 1556 12/19/22 1557 12/19/22 2030  BP: (!) 141/66  (!) 176/73  Pulse: (!) 52  (!) 47  Resp: 14  16  Temp: 98 F (36.7 C)  98 F (36.7 C)  TempSrc: Oral    SpO2: 100%  96%  Weight:  210 lb 15.7 oz (95.7 kg)   Height:  5\' 7"  (1.702 m)   PainSc:  0-No pain     Isolation Precautions No active isolations  Medications Medications  cefTRIAXone (ROCEPHIN) 1 g in sodium chloride 0.9 % 100 mL IVPB (0 g Intravenous Stopped 12/19/22 2222)    Mobility non-ambulatory     Focused Assessments     R Recommendations: See Admitting Provider Note  Report given to:   Additional Notes:  ]

## 2022-12-20 DIAGNOSIS — N1831 Chronic kidney disease, stage 3a: Secondary | ICD-10-CM | POA: Diagnosis not present

## 2022-12-20 DIAGNOSIS — T83511A Infection and inflammatory reaction due to indwelling urethral catheter, initial encounter: Secondary | ICD-10-CM

## 2022-12-20 DIAGNOSIS — E1122 Type 2 diabetes mellitus with diabetic chronic kidney disease: Secondary | ICD-10-CM

## 2022-12-20 DIAGNOSIS — H409 Unspecified glaucoma: Secondary | ICD-10-CM | POA: Insufficient documentation

## 2022-12-20 DIAGNOSIS — N179 Acute kidney failure, unspecified: Secondary | ICD-10-CM | POA: Diagnosis not present

## 2022-12-20 DIAGNOSIS — G9341 Metabolic encephalopathy: Secondary | ICD-10-CM | POA: Diagnosis not present

## 2022-12-20 LAB — COMPREHENSIVE METABOLIC PANEL
ALT: 24 U/L (ref 0–44)
AST: 22 U/L (ref 15–41)
Albumin: 2.9 g/dL — ABNORMAL LOW (ref 3.5–5.0)
Alkaline Phosphatase: 69 U/L (ref 38–126)
Anion gap: 7 (ref 5–15)
BUN: 30 mg/dL — ABNORMAL HIGH (ref 8–23)
CO2: 21 mmol/L — ABNORMAL LOW (ref 22–32)
Calcium: 8.7 mg/dL — ABNORMAL LOW (ref 8.9–10.3)
Chloride: 115 mmol/L — ABNORMAL HIGH (ref 98–111)
Creatinine, Ser: 1.67 mg/dL — ABNORMAL HIGH (ref 0.61–1.24)
GFR, Estimated: 38 mL/min — ABNORMAL LOW (ref >60)
Glucose, Bld: 156 mg/dL — ABNORMAL HIGH (ref 70–99)
Potassium: 3.7 mmol/L (ref 3.5–5.1)
Sodium: 143 mmol/L (ref 135–145)
Total Bilirubin: 1.1 mg/dL (ref <1.2)
Total Protein: 6.2 g/dL — ABNORMAL LOW (ref 6.5–8.1)

## 2022-12-20 LAB — CBC
HCT: 25.5 % — ABNORMAL LOW (ref 39.0–52.0)
Hemoglobin: 8 g/dL — ABNORMAL LOW (ref 13.0–17.0)
MCH: 32.4 pg (ref 26.0–34.0)
MCHC: 31.4 g/dL (ref 30.0–36.0)
MCV: 103.2 fL — ABNORMAL HIGH (ref 80.0–100.0)
Platelets: 97 10*3/uL — ABNORMAL LOW (ref 150–400)
RBC: 2.47 MIL/uL — ABNORMAL LOW (ref 4.22–5.81)
RDW: 28.6 % — ABNORMAL HIGH (ref 11.5–15.5)
WBC: 4.5 10*3/uL (ref 4.0–10.5)
nRBC: 3.3 % — ABNORMAL HIGH (ref 0.0–0.2)

## 2022-12-20 LAB — PROTEIN / CREATININE RATIO, URINE
Creatinine, Urine: 133 mg/dL
Protein Creatinine Ratio: 0.42 mg/mg{creat} — ABNORMAL HIGH (ref 0.00–0.15)
Total Protein, Urine: 56 mg/dL

## 2022-12-20 LAB — MAGNESIUM: Magnesium: 2.1 mg/dL (ref 1.7–2.4)

## 2022-12-20 LAB — PHOSPHORUS: Phosphorus: 3.1 mg/dL (ref 2.5–4.6)

## 2022-12-20 LAB — VITAMIN B12: Vitamin B-12: 954 pg/mL — ABNORMAL HIGH (ref 180–914)

## 2022-12-20 LAB — TSH: TSH: 1.849 u[IU]/mL (ref 0.350–4.500)

## 2022-12-20 LAB — FOLATE: Folate: 23.4 ng/mL (ref >5.9)

## 2022-12-20 MED ORDER — SODIUM CHLORIDE 0.9 % IV SOLN
1.0000 g | INTRAVENOUS | Status: DC
  Administered 2022-12-20 – 2022-12-21 (×2): 1 g via INTRAVENOUS
  Filled 2022-12-20: qty 10

## 2022-12-20 MED ORDER — CARVEDILOL 3.125 MG PO TABS
6.2500 mg | ORAL_TABLET | Freq: Two times a day (BID) | ORAL | Status: DC
Start: 2022-12-20 — End: 2022-12-21
  Administered 2022-12-21: 6.25 mg via ORAL
  Filled 2022-12-20 (×2): qty 2

## 2022-12-20 MED ORDER — FOLIC ACID 1 MG PO TABS
1.0000 mg | ORAL_TABLET | Freq: Every day | ORAL | Status: DC
  Administered 2022-12-20 – 2022-12-21 (×2): 1 mg via ORAL
  Filled 2022-12-20 (×2): qty 1

## 2022-12-20 MED ORDER — LACTATED RINGERS IV SOLN: INTRAVENOUS | Status: DC

## 2022-12-20 MED ORDER — LATANOPROST 0.005 % OP SOLN
1.0000 [drp] | Freq: Every day | OPHTHALMIC | Status: DC
  Administered 2022-12-20: 1 [drp] via OPHTHALMIC
  Filled 2022-12-20: qty 2.5

## 2022-12-20 MED ORDER — ENOXAPARIN SODIUM 40 MG/0.4ML IJ SOSY
40.0000 mg | PREFILLED_SYRINGE | INTRAMUSCULAR | Status: DC
  Administered 2022-12-20 – 2022-12-21 (×2): 40 mg via SUBCUTANEOUS
  Filled 2022-12-20 (×3): qty 0.4

## 2022-12-20 MED ORDER — CHLORHEXIDINE GLUCONATE CLOTH 2 % EX PADS
6.0000 | MEDICATED_PAD | Freq: Every day | CUTANEOUS | Status: DC
  Administered 2022-12-20 – 2022-12-21 (×2): 6 via TOPICAL

## 2022-12-20 MED ORDER — TAMSULOSIN HCL 0.4 MG PO CAPS
0.4000 mg | ORAL_CAPSULE | Freq: Every day | ORAL | Status: DC
  Administered 2022-12-20 – 2022-12-21 (×2): 0.4 mg via ORAL
  Filled 2022-12-20 (×2): qty 1

## 2022-12-20 MED ORDER — VITAMIN B-12 1000 MCG PO TABS
1000.0000 ug | ORAL_TABLET | Freq: Every day | ORAL | Status: DC
Start: 2022-12-20 — End: 2022-12-21
  Administered 2022-12-20 – 2022-12-21 (×2): 1000 ug via ORAL
  Filled 2022-12-20 (×2): qty 1

## 2022-12-20 MED ORDER — SODIUM CHLORIDE 0.9 % IV SOLN
INTRAVENOUS | Status: AC
Start: 2022-12-20 — End: 2022-12-20

## 2022-12-20 MED ORDER — AMLODIPINE BESYLATE 5 MG PO TABS
2.5000 mg | ORAL_TABLET | Freq: Every day | ORAL | Status: DC
  Administered 2022-12-20 – 2022-12-21 (×2): 2.5 mg via ORAL
  Filled 2022-12-20 (×2): qty 1

## 2022-12-20 NOTE — Plan of Care (Signed)

## 2022-12-20 NOTE — Plan of Care (Signed)
  Problem: Acute Rehab PT Goals(only PT should resolve) Goal: Pt Will Go Supine/Side To Sit Flowsheets (Taken 12/20/2022 1112) Pt will go Supine/Side to Sit: with modified independence Goal: Patient Will Transfer Sit To/From Stand Flowsheets (Taken 12/20/2022 1112) Patient will transfer sit to/from stand: with modified independence Goal: Pt Will Ambulate Flowsheets (Taken 12/20/2022 1112) Pt will Ambulate:  50 feet  with modified independence  with rolling walker  Elie Goody, DPT [facility] [place] [phone] office

## 2022-12-20 NOTE — TOC Initial Note (Signed)
Transition of Care [facility]) - Initial/Assessment Note    Patient Details  Name: Terrance Miller MRN: [id number] Date of Birth: Apr 21, 1928  [facility] of [facility]) CM/SW Contact:    Villa Herb, LCSWA Phone Number: 12/20/2022, 11:55 AM  Clinical Narrative:                 Pt is high risk for readmission. CSW spoke with pts daughter to complete assessment. Pt lives with his daughter. Pt is mostly independent but his daughter will assist if needed. Pts son in law provides transportation when needed. CSW spoke about PT recommendation for [facility], pts daughter wants to think about this and asked that CSW follow up tomorrow for final decision. Pt has a walker in the home. TOC to follow.   Expected Discharge Plan: Home w Home Health Services Barriers to Discharge: Continued Medical Work up   Patient Goals and CMS Choice Patient states their goals for this hospitalization and ongoing recovery are:: return home CMS Medicare.gov Compare Post Acute Care list provided to:: Patient Choice offered to / list presented to : Patient      Expected Discharge Plan and Services In-house Referral: Clinical Social Work Discharge Planning Services: CM Consult   Living arrangements for the past 2 months: Single Family Home                                      Prior Living Arrangements/Services Living arrangements for the past 2 months: Single Family Home Lives with:: Adult Children Patient language and need for interpreter reviewed:: Yes Do you feel safe going back to the place where you live?: Yes      Need for Family Participation in Patient Care: Yes (Comment) Care giver support system in place?: Yes (comment) Current home services: DME Criminal Activity/Legal Involvement Pertinent to Current Situation/Hospitalization: No - Comment as needed  Activities of Daily Living   ADL Screening (condition at time of admission) Independently performs ADLs?: Yes (appropriate for developmental  age) Is the patient deaf or have difficulty hearing?: Yes Does the patient have difficulty seeing, even when wearing glasses/contacts?: No Does the patient have difficulty concentrating, remembering, or making decisions?: No  Permission Sought/Granted                  Emotional Assessment Appearance:: Appears stated age       Alcohol / Substance Use: Not Applicable Psych Involvement: No (comment)  Admission diagnosis:  Hallucinations [R44.3] Acute cystitis without hematuria [N30.00] Acute metabolic encephalopathy [G93.41] Indwelling catheter replaced [Z46.6] Patient Active Problem List   Diagnosis Date Noted   Type 2 diabetes mellitus with chronic kidney disease, with long-term current use of insulin (HCC) 12/20/2022   Glaucoma 12/20/2022   Acute renal failure superimposed on stage 3a chronic kidney disease (HCC) 12/20/2022   Acute metabolic encephalopathy 12/19/2022   Vascular dementia without behavioral disturbance (HCC) 08/15/2022   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   Aortic atherosclerosis (HCC) 08/05/2022   Chronic constipation 08/05/2022   Increased intraocular pressure, bilateral 08/05/2022   Edema, peripheral 08/05/2022   Chronic indwelling Foley catheter 07/29/2022   Aortic regurgitation 03/17/2022   Pulmonary HTN (HCC) 03/17/2022   Dry skin dermatitis 02/05/2022   UTI (urinary tract infection) due to urinary indwelling Foley catheter (HCC) 01/28/2022   Hypokalemia 01/27/2022   Stage 3b chronic kidney disease (HCC) 11/05/2021   MGUS (monoclonal gammopathy of unknown significance) 11/05/2021  Acute kidney injury superimposed on stage 3b chronic kidney disease (HCC) 07/27/2021   Macrocytic anemia 05/31/2021   History of MI (myocardial infarction) 04/29/2021   Hypothermia 05/20/2017   Essential hypertension 09/28/2013   Hyperlipidemia LDL goal <70 09/28/2013   CAD (coronary artery disease) 09/28/2013   PCP:  [name],  DO Pharmacy:   [facility] - [place], [place] - [address] [place] [address] Phone: [phone] Fax: [phone]  [facility] - [place], [place] - [address] [place] [address] Phone: [phone] Fax: [phone]     Social Drivers of Health (SDOH) Social History: SDOH Screenings   Food Insecurity: No Food Insecurity (12/20/2022)  Housing: Low Risk  (12/20/2022)  Transportation Needs: No Transportation Needs (12/20/2022)  Utilities: Not At Risk (12/20/2022)  Alcohol Screen: Low Risk  (07/25/2022)  Depression (PHQ2-9): Low Risk  (08/25/2022)  Financial Resource Strain: Low Risk  (08/20/2022)  Physical Activity: Insufficiently Active (07/25/2022)  Social Connections: Socially Isolated (07/25/2022)  Stress: No Stress Concern Present (08/20/2022)  Tobacco Use: Medium Risk (12/19/2022)  Health Literacy: Adequate Health Literacy (07/25/2022)   SDOH Interventions:     Readmission Risk Interventions    12/20/2022   11:51 AM  Readmission Risk Prevention Plan  Transportation Screening Complete  HRI or Home Care Consult Complete  Social Work Consult for Recovery Care Planning/Counseling Complete  Palliative Care Screening Not Applicable  Medication Review Oceanographer) Complete

## 2022-12-20 NOTE — Progress Notes (Signed)
Patient alert with confusion at times, tolerated medications whole with no complaints. PT evaluated patient during shift, see note. Noted patients tele monitor reading SB, MD Tat made aware. New order placed for EKG. EKG obtained. Noted during assessment patient had wound to scrotum. MD Tat made aware.

## 2022-12-20 NOTE — Progress Notes (Signed)
PROGRESS NOTE  Terrance Miller ZHY:[id number] DOB: [birth year] DOA: 12/19/2022 PCP: [name], DO  Brief History:  89 year old male with a history of hypertension, hyperlipidemia, diabetes mellitus type 2, coronary disease, urinary retention with chronic indwelling Foley catheter, CKD stage III presenting with confusion and hallucinations that began on 12/18/2022.  The patient's daughter also noticed that he has been little bit weaker than usual over the past 2 to 3 days.  There is been no nausea, vomiting, diarrhea, abd pain, chest pain, shortness breath, fevers, chills. At baseline, the patient is able to get out of bed on his own and dress himself.  He does need some assistance with some of his other ADLs.  Daughter states at baseline the patient is able to carry a conversation, but does struggle occasionally with the date.  He has chronic lower extremity edema which she states has been about the same as usual.  She states that he is not taking furosemide any longer.  The patient was recently started on amlodipine 2.5 mg.  Otherwise there is been no other new medication changes. In the ED, the patient had low-grade temperature 99.1 F.  He was hemodynamically stable with oxygen saturation 90% on room air.  WBC 5.8, hemoglobin 9.2, platelets 113.  Sodium 143, potassium 3.7, bicarbonate 21, serum creatinine 1.82.  UA showed> 50 WBC.  The patient was started on ceftriaxone and IV fluids.   Assessment/Plan: Acute metabolic encephalopathy -Secondary to UTI and acute on chronic renal failure -Patient is alert but remains confused.  Hallucinations seem to be better -UA>50 WBC -Continue IV fluids -B12 954 -folate 23.4  CAUTI/chronic urinary retention -Patient has chronic indwelling Foley catheter -Foley catheter previously changed 11/22/2022 -Foley catheter was changed on 12/19/2022 -Continue empiric ceftriaxone pending culture data -bilateral hydrocelectomy by Dr. Pete Glatter in 11/2021   -Outpatient urology follow-up  CKD stage IIIa -Baseline creatinine 1.1-1.3 -Presented with serum creatinine 1.82 -Continue IV fluids>> improving  Essential hypertension -Continue amlodipine and carvedilol  Chronic lower extremity edema -09/19/2020 echo EF 55 to 60%, no WMA, grade 1 DD, RVSP 62 -Certainly, pulmonary hypertension is contributing -Currently not on furosemide Macrocytic anemia/MGUS -There is been concern of MDS versus other myeloproliferative disorder -Family has deferred bone marrow biopsy -Continue [facility] at [facility] -continue B12 supplementation -check urine protein creatinine ratio  Coronary artery disease -No chest pain presently -Continue aspirin -Carvedilol   Impaired glucose tolerance -04/29/2020 hemoglobin A1c 6.3 -Allowing for global glycemic control at this point        Family Communication:   daughter updated 12/14  Consultants:  none  Code Status:  DNR  DVT Prophylaxis:   [place] Lovenox   Procedures: As Listed in Progress Note Above  Antibiotics: Ceftriaxone 12/13>>        Subjective: Patient denies fevers, chills, headache, chest pain, dyspnea, nausea, vomiting, diarrhea, abdominal pain,   Objective: Vitals:   12/20/22 0100 12/20/22 0344 12/20/22 0500 12/20/22 0745  BP:  (!) 145/73  134/67  Pulse:  (!) 50  (!) 51  Resp: 14 18    Temp: (!) 95.1 F (35.1 C) (!) 96.9 F (36.1 C) 99.1 F (37.3 C) (!) 96.9 F (36.1 C)  TempSrc: Rectal Rectal  Oral  SpO2:  98%  99%  Weight:      Height:        Intake/Output Summary (Last 24 hours) at 12/20/2022 0834 Last data filed at 12/20/2022 0700 Gross per 24 hour  Intake 23.77 ml  Output 450 ml  Net -426.23 ml   Weight change:  Exam:  General:  Pt is alert, follows commands appropriately, not in acute distress HEENT: No icterus, No thrush, No neck mass, [place]/AT Cardiovascular: RRR, S1/S2, no rubs, no gallops Respiratory: CTA bilaterally, no wheezing, no crackles,  no rhonchi Abdomen: Soft/+BS, non tender, non distended, no guarding Extremities: 2+LE edema, No lymphangitis, No petechiae, No rashes, no synovitis   Data Reviewed: I have personally reviewed following labs and imaging studies Basic Metabolic Panel: Recent Labs  Lab 12/19/22 1613 12/20/22 0409  NA 143 143  K 3.8 3.7  CL 113* 115*  CO2 21* 21*  GLUCOSE 111* 156*  BUN 29* 30*  CREATININE 1.82* 1.67*  CALCIUM 9.0 8.7*  MG  --  2.1  PHOS  --  3.1   Liver Function Tests: Recent Labs  Lab 12/20/22 0409  AST 22  ALT 24  ALKPHOS 69  BILITOT 1.1  PROT 6.2*  ALBUMIN 2.9*   No results for input(s): "LIPASE", "AMYLASE" in the last 168 hours. No results for input(s): "AMMONIA" in the last 168 hours. Coagulation Profile: No results for input(s): "INR", "PROTIME" in the last 168 hours. CBC: Recent Labs  Lab 12/19/22 1613 12/20/22 0409  WBC 5.8 4.5  HGB 9.2* 8.0*  HCT 28.1* 25.5*  MCV 103.3* 103.2*  PLT 113* 97*   Cardiac Enzymes: No results for input(s): "CKTOTAL", "CKMB", "CKMBINDEX", "TROPONINI" in the last 168 hours. BNP: Invalid input(s): "POCBNP" CBG: No results for input(s): "GLUCAP" in the last 168 hours. HbA1C: No results for input(s): "HGBA1C" in the last 72 hours. Urine analysis:    Component Value Date/Time   COLORURINE YELLOW 12/19/2022 2003   APPEARANCEUR CLOUDY (A) 12/19/2022 2003   APPEARANCEUR Clear 07/16/2021 1132   LABSPEC 1.015 12/19/2022 2003   PHURINE 5.0 12/19/2022 2003   GLUCOSEU NEGATIVE 12/19/2022 2003   HGBUR SMALL (A) 12/19/2022 2003   BILIRUBINUR NEGATIVE 12/19/2022 2003   BILIRUBINUR Negative 07/16/2021 1132   KETONESUR NEGATIVE 12/19/2022 2003   PROTEINUR 30 (A) 12/19/2022 2003   NITRITE NEGATIVE 12/19/2022 2003   LEUKOCYTESUR LARGE (A) 12/19/2022 2003   Sepsis Labs: @LABRCNTIP (procalcitonin:4,lacticidven:4) )No results found for this or any previous visit (from the past 240 hours).   Scheduled Meds:  amLODipine  2.5 mg  Oral Daily   carvedilol  6.25 mg Oral BID   cyanocobalamin  1,000 mcg Oral Daily   enoxaparin (LOVENOX) injection  40 mg Subcutaneous Q24H   folic acid  1 mg Oral Daily   latanoprost  1 drop Both Eyes QHS   tamsulosin  0.4 mg Oral Daily   Continuous Infusions:  sodium chloride 100 mL/hr at 12/20/22 0245   cefTRIAXone (ROCEPHIN)  IV      Procedures/Studies: No results found.  Catarina Hartshorn, DO  [facility]  If 7PM-7AM, please contact night-coverage [email or web address] Password [email or web address] 12/20/2022, 8:34 AM   LOS: 1 day

## 2022-12-20 NOTE — Evaluation (Signed)
Physical Therapy Evaluation Patient Details Name: Terrance Miller MRN: [id number] DOB: December 10, 1928 Today's Date: 12/20/2022  History of Present Illness  89 year old male with a history of hypertension, hyperlipidemia, diabetes mellitus type 2, coronary disease, urinary retention with chronic indwelling Foley catheter, CKD stage III presenting with confusion and hallucinations that began on 12/18/2022.  The patient's daughter also noticed that he has been little bit weaker than usual over the past 2 to 3 days.  There is been no nausea, vomiting, diarrhea, abd pain, chest pain, shortness breath, fevers, chills.  At baseline, the patient is able to get out of bed on his own and dress himself.  He does need some assistance with some of his other ADLs.  Daughter states at baseline the patient is able to carry a conversation, but does struggle occasionally with the date.  He has chronic lower extremity edema which she states has been about the same as usual.  She states that he is not taking furosemide any longer.  The patient was recently started on amlodipine 2.5 mg.  Otherwise there is been no other new medication changes.  In the ED, the patient had low-grade temperature 99.1 F.  He was hemodynamically stable with oxygen saturation 90% on room air.  WBC 5.8, hemoglobin 9.2, platelets 113.  Sodium 143, potassium 3.7, bicarbonate 21, serum creatinine 1.82.  UA showed> 50 WBC.  The patient was started on ceftriaxone and IV fluids.  Clinical Impression   Pt tolerated today's Physical Therapy Evaluation, well with great carryover for ambulation with rolling walker for 20 feet.  Patient given moderate assist for bed mobility, min assist for sit to stand transfers but safe and steady with ambulation with RW with slow, shuffling like pattern. . Pt demonstrating decline in transfers, ambulation, and ADLs due to muscle weakness, deconditioning, reduced range of motion in setting of BLE edema, and balance deficits. Based  upon these deficits/impairments, patient will benefit from continued skilled physical therapy services during remainder of hospital stay and at the next recommended venue of care to address deficits and promote return to optimal function.                If plan is discharge home, recommend the following: A little help with walking and/or transfers;A lot of help with bathing/dressing/bathroom   Can travel by private vehicle        Equipment Recommendations None recommended by PT  Recommendations for Other Services       Functional Status Assessment Patient has had a recent decline in their functional status and demonstrates the ability to make significant improvements in function in a reasonable and predictable amount of time.     Precautions / Restrictions Precautions Precautions: Fall Restrictions Weight Bearing Restrictions Per Provider Order: No      Mobility  Bed Mobility Overal bed mobility: Needs Assistance Bed Mobility: Sit to Supine, Supine to Sit     Supine to sit: Mod assist Sit to supine: Mod assist   General bed mobility comments: Assist at trunk for elevation supine to sit. Sit to supine assist at BLE for power to bed. Patient Response: Cooperative  Transfers Overall transfer level: Needs assistance Equipment used: Rolling walker (2 wheels) Transfers: Sit to/from Stand Sit to Stand: Min assist           General transfer comment: power to stand from RW with min assist.    Ambulation/Gait Ambulation/Gait assistance: Supervision Gait Distance (Feet): 20 Feet Assistive device: Rolling walker (2 wheels) Gait Pattern/deviations: WFL(Within  Functional Limits), Shuffle, Decreased step length - right, Decreased step length - left, Decreased weight shift to right, Decreased weight shift to left       General Gait Details: slow, steady gait with RW at supervision level. 59ft to/from EOB in room and back.  Stairs            Wheelchair  Mobility     Tilt Bed Tilt Bed Patient Response: Cooperative  Modified Rankin (Stroke Patients Only)       Balance Overall balance assessment: Needs assistance Sitting-balance support: No upper extremity supported Sitting balance-Leahy Scale: Fair Sitting balance - Comments: At EOB   Standing balance support: Bilateral upper extremity supported, During functional activity, Reliant on assistive device for balance Standing balance-Leahy Scale: Fair Standing balance comment: with RW                             Pertinent Vitals/Pain Pain Assessment Pain Assessment: No/denies pain    Home Living Family/patient expects to be discharged to:: Private residence Living Arrangements: Children Available Help at Discharge: Family;Available 24 hours/day Type of Home: House Home Access: Stairs to enter Entrance Stairs-Rails: None Entrance Stairs-Number of Steps: 3 Alternate Level Stairs-Number of Steps: 4 Home Layout: Able to live on main level with bedroom/bathroom;Two level Home Equipment: Toilet riser;Cane - single point;Rolling Walker (2 wheels);Rollator (4 wheels) Additional Comments: Information taken from previous admission with pt confirmation today.    Prior Function Prior Level of Function : Needs assist       Physical Assist : Mobility (physical);ADLs (physical) Mobility (physical): Transfers;Gait;Bed mobility;Stairs ADLs (physical): IADLs;Dressing;Bathing Mobility Comments: household ambulator using RW. pt reports assist from daughter and son in law that pt given assist for sit/stand transfers and ambulates independently with RW. ADLs Comments: Assisted with bathing and dressing per pt's reports. Chart review indicates assist for IADL's as well. Pt confirms.     Extremity/Trunk Assessment   Upper Extremity Assessment Upper Extremity Assessment: Generalized weakness    Lower Extremity Assessment Lower Extremity Assessment: Generalized weakness     Cervical / Trunk Assessment Cervical / Trunk Assessment: Kyphotic  Communication   Communication Communication: Hearing impairment Cueing Techniques: Verbal cues;Tactile cues  Cognition Arousal: Alert Behavior During Therapy: WFL for tasks assessed/performed Overall Cognitive Status: Within Functional Limits for tasks assessed                                 General Comments: A & O to 3; unable to give date        General Comments      Exercises     Assessment/Plan    PT Assessment Patient needs continued PT services  PT Problem List Decreased strength;Decreased range of motion;Decreased activity tolerance;Decreased balance;Decreased mobility       PT Treatment Interventions DME instruction;Gait training;Stair training;Functional mobility training;Therapeutic activities;Therapeutic exercise;Balance training;Neuromuscular re-education    PT Goals (Current goals can be found in the Care Plan section)  Acute Rehab PT Goals Patient Stated Goal: return home with family to assist PT Goal Formulation: With patient Time For Goal Achievement: 01/03/23 Potential to Achieve Goals: Good    Frequency Min 3X/week     Co-evaluation               AM-PAC PT "6 Clicks" Mobility  Outcome Measure Help needed turning from your back to your side while in a flat bed without using  bedrails?: A Little Help needed moving from lying on your back to sitting on the side of a flat bed without using bedrails?: A Little Help needed moving to and from a bed to a chair (including a wheelchair)?: A Little Help needed standing up from a chair using your arms (e.g., wheelchair or bedside chair)?: A Little Help needed to walk in hospital room?: A Little Help needed climbing 3-5 steps with a railing? : A Lot 6 Click Score: 17    End of Session Equipment Utilized During Treatment: Gait belt Activity Tolerance: Patient tolerated treatment well Patient left: in bed;with call  bell/phone within reach;with bed alarm set Nurse Communication: Mobility status PT Visit Diagnosis: Unsteadiness on feet (R26.81);Muscle weakness (generalized) (M62.81);Other abnormalities of gait and mobility (R26.89)    Time: 2841-3244 PT Time Calculation (min) (ACUTE ONLY): 24 min   Charges:   PT Evaluation $PT Eval Low Complexity: 1 Low PT Treatments $Therapeutic Activity: 8-22 mins PT General Charges $$ ACUTE PT VISIT: 1 Visit         Elie Goody, DPT [facility] [place] 336 [phone] office   Nelida Meuse 12/20/2022, 11:09 AM

## 2022-12-20 NOTE — Hospital Course (Signed)
89 year old male with a history of hypertension, hyperlipidemia, diabetes mellitus type 2, coronary disease, urinary retention with chronic indwelling Foley catheter, CKD stage III presenting with confusion and hallucinations that began on 12/18/2022.  The patient's daughter also noticed that he has been little bit weaker than usual over the past 2 to 3 days.  There is been no nausea, vomiting, diarrhea, abd pain, chest pain, shortness breath, fevers, chills. At baseline, the patient is able to get out of bed on his own and dress himself.  He does need some assistance with some of his other ADLs.  Daughter states at baseline the patient is able to carry a conversation, but does struggle occasionally with the date.  He has chronic lower extremity edema which she states has been about the same as usual.  She states that he is not taking furosemide any longer.  The patient was recently started on amlodipine 2.5 mg.  Otherwise there is been no other new medication changes. In the ED, the patient had low-grade temperature 99.1 F.  He was hemodynamically stable with oxygen saturation 90% on room air.  WBC 5.8, hemoglobin 9.2, platelets 113.  Sodium 143, potassium 3.7, bicarbonate 21, serum creatinine 1.82.  UA showed> 50 WBC.  The patient was started on ceftriaxone and IV fluids.

## 2022-12-21 DIAGNOSIS — N1832 Chronic kidney disease, stage 3b: Secondary | ICD-10-CM | POA: Diagnosis not present

## 2022-12-21 DIAGNOSIS — G9341 Metabolic encephalopathy: Secondary | ICD-10-CM | POA: Diagnosis not present

## 2022-12-21 LAB — BASIC METABOLIC PANEL
Anion gap: 7 (ref 5–15)
BUN: 28 mg/dL — ABNORMAL HIGH (ref 8–23)
CO2: 20 mmol/L — ABNORMAL LOW (ref 22–32)
Calcium: 8.8 mg/dL — ABNORMAL LOW (ref 8.9–10.3)
Chloride: 115 mmol/L — ABNORMAL HIGH (ref 98–111)
Creatinine, Ser: 1.79 mg/dL — ABNORMAL HIGH (ref 0.61–1.24)
GFR, Estimated: 35 mL/min — ABNORMAL LOW (ref >60)
Glucose, Bld: 98 mg/dL (ref 70–99)
Potassium: 4 mmol/L (ref 3.5–5.1)
Sodium: 142 mmol/L (ref 135–145)

## 2022-12-21 LAB — MAGNESIUM: Magnesium: 1.9 mg/dL (ref 1.7–2.4)

## 2022-12-21 MED ORDER — CEFDINIR 300 MG PO CAPS: 300.0000 mg | ORAL_CAPSULE | Freq: Two times a day (BID) | ORAL | 0 refills | Status: DC

## 2022-12-21 MED ORDER — CEFDINIR 300 MG PO CAPS: 300.0000 mg | ORAL_CAPSULE | Freq: Two times a day (BID) | ORAL | Status: DC

## 2022-12-21 MED ORDER — ACETAMINOPHEN 325 MG PO TABS: 650.0000 mg | ORAL_TABLET | Freq: Four times a day (QID) | ORAL | Status: DC | PRN

## 2022-12-21 NOTE — Progress Notes (Signed)
PROGRESS NOTE  Markie Sopp AOZ:[id number] DOB: 01-19-1928 DOA: 12/19/2022 PCP: [name], DO  Brief History:  89 year old male with a history of hypertension, hyperlipidemia, diabetes mellitus type 2, coronary disease, urinary retention with chronic indwelling Foley catheter, CKD stage III presenting with confusion and hallucinations that began on 12/18/2022.  The patient's daughter also noticed that he has been little bit weaker than usual over the past 2 to 3 days.  There is been no nausea, vomiting, diarrhea, abd pain, chest pain, shortness breath, fevers, chills. At baseline, the patient is able to get out of bed on his own and dress himself.  He does need some assistance with some of his other ADLs.  Daughter states at baseline the patient is able to carry a conversation, but does struggle occasionally with the date.  He has chronic lower extremity edema which she states has been about the same as usual.  She states that he is not taking furosemide any longer.  The patient was recently started on amlodipine 2.5 mg.  Otherwise there is been no other new medication changes. In the ED, the patient had low-grade temperature 99.1 F.  He was hemodynamically stable with oxygen saturation 90% on room air.  WBC 5.8, hemoglobin 9.2, platelets 113.  Sodium 143, potassium 3.7, bicarbonate 21, serum creatinine 1.82.  UA showed> 50 WBC.  The patient was started on ceftriaxone and IV fluids.   Assessment/Plan:  Acute metabolic encephalopathy -Secondary to UTI and acute on chronic renal failure -Patient is alert but remains confused.  Hallucinations seem to be better -UA>50 WBC -Continue IV fluids -B12 954 -folate 23.4   CAUTI/chronic urinary retention -Patient has chronic indwelling Foley catheter -Foley catheter previously changed 11/22/2022 -Foley catheter was changed on 12/19/2022 -Continue empiric ceftriaxone pending culture data -bilateral hydrocelectomy by Dr. Pete Glatter in  11/2021  -Outpatient urology follow-up   CKD stage IIIb -Baseline creatinine 1.4-1.7 -Presented with serum creatinine 1.82 -Continue IV fluids>> improving   Essential hypertension -Continue amlodipine and carvedilol   Chronic lower extremity edema -09/19/2020 echo EF 55 to 60%, no WMA, grade 1 DD, RVSP 62 -Certainly, pulmonary hypertension is contributing -Currently not on furosemide -check urine protein creatinine ratio--0.42   Macrocytic anemia/MGUS -There is been concern of MDS versus other myeloproliferative disorder -Family has deferred bone marrow biopsy -Continue follow-up at [facility] -continue B12 supplementation  Coronary artery disease -No chest pain presently -Continue aspirin -Carvedilol    Impaired glucose tolerance -04/29/2020 hemoglobin A1c 6.3 -Allowing for global glycemic control at this point               Family Communication:   daughter updated 12/14   Consultants:  none   Code Status:  DNR   DVT Prophylaxis:   [place] Lovenox     Procedures: As Listed in Progress Note Above   Antibiotics: Ceftriaxone 12/13>>      Subjective: Patient denies fevers, chills, headache, chest pain, dyspnea, nausea, vomiting, diarrhea, abdominal pain, dysuria   Objective: Vitals:   12/20/22 2113 12/21/22 0502 12/21/22 0900 12/21/22 0937  BP: 132/72 (!) 173/88 (!) 148/73 (!) 148/73  Pulse: (!) 50 (!) 58 (!) 51   Resp: 15     Temp: 97.6 F (36.4 C) 97.8 F (36.6 C)    TempSrc:  Oral    SpO2: 98% 100% 97%   Weight:   102.3 kg   Height:   5\' 6"  (1.676 m)  Intake/Output Summary (Last 24 hours) at 12/21/2022 1235 Last data filed at 12/21/2022 0900 Gross per 24 hour  Intake 2571.94 ml  Output 425 ml  Net 2146.94 ml   Weight change:  Exam:  General:  Pt is alert, follows commands appropriately, not in acute distress HEENT: No icterus, No thrush, No neck mass, /AT Cardiovascular: RRR, S1/S2, no rubs, no gallops Respiratory: CTA  bilaterally, no wheezing, no crackles, no rhonchi Abdomen: Soft/+BS, non tender, non distended, no guarding Extremities: 2 + LE edema, No lymphangitis, No petechiae, No rashes, no synovitis   Data Reviewed: I have personally reviewed following labs and imaging studies Basic Metabolic Panel: Recent Labs  Lab 12/19/22 1613 12/20/22 0409 12/21/22 0429  NA 143 143 142  K 3.8 3.7 4.0  CL 113* 115* 115*  CO2 21* 21* 20*  GLUCOSE 111* 156* 98  BUN 29* 30* 28*  CREATININE 1.82* 1.67* 1.79*  CALCIUM 9.0 8.7* 8.8*  MG  --  2.1 1.9  PHOS  --  3.1  --    Liver Function Tests: Recent Labs  Lab 12/20/22 0409  AST 22  ALT 24  ALKPHOS 69  BILITOT 1.1  PROT 6.2*  ALBUMIN 2.9*   No results for input(s): "LIPASE", "AMYLASE" in the last 168 hours. No results for input(s): "AMMONIA" in the last 168 hours. Coagulation Profile: No results for input(s): "INR", "PROTIME" in the last 168 hours. CBC: Recent Labs  Lab 12/19/22 1613 12/20/22 0409  WBC 5.8 4.5  HGB 9.2* 8.0*  HCT 28.1* 25.5*  MCV 103.3* 103.2*  PLT 113* 97*   Cardiac Enzymes: No results for input(s): "CKTOTAL", "CKMB", "CKMBINDEX", "TROPONINI" in the last 168 hours. BNP: Invalid input(s): "POCBNP" CBG: No results for input(s): "GLUCAP" in the last 168 hours. HbA1C: No results for input(s): "HGBA1C" in the last 72 hours. Urine analysis:    Component Value Date/Time   COLORURINE YELLOW 12/19/2022 2003   APPEARANCEUR CLOUDY (A) 12/19/2022 2003   APPEARANCEUR Clear 07/16/2021 1132   LABSPEC 1.015 12/19/2022 2003   PHURINE 5.0 12/19/2022 2003   GLUCOSEU NEGATIVE 12/19/2022 2003   HGBUR SMALL (A) 12/19/2022 2003   BILIRUBINUR NEGATIVE 12/19/2022 2003   BILIRUBINUR Negative 07/16/2021 1132   KETONESUR NEGATIVE 12/19/2022 2003   PROTEINUR 30 (A) 12/19/2022 2003   NITRITE NEGATIVE 12/19/2022 2003   LEUKOCYTESUR LARGE (A) 12/19/2022 2003   Sepsis Labs: @LABRCNTIP (procalcitonin:4,lacticidven:4) ) Recent Results  (from the past 240 hours)  Urine Culture     Status: None (Preliminary result)   Collection Time: 12/19/22  8:03 PM   Specimen: Urine, Random  Result Value Ref Range Status   Specimen Description   Final    URINE, RANDOM Performed at [facility], [address] [place], [place] [address]    Special Requests   Final    NONE Reflexed from [id number] Performed at [facility], [address], [place], [place] [address]    Culture   Final    CULTURE REINCUBATED FOR BETTER GROWTH Performed at [facility], [address], [place], [place] [address]    Report Status PENDING  Incomplete     Scheduled Meds:  amLODipine  2.5 mg Oral Daily   carvedilol  6.25 mg Oral BID   Chlorhexidine Gluconate Cloth  6 each Topical Daily   cyanocobalamin  1,000 mcg Oral Daily   enoxaparin (LOVENOX) injection  40 mg Subcutaneous Q24H   folic acid  1 mg Oral Daily   latanoprost  1 drop Both Eyes  QHS   tamsulosin  0.4 mg Oral Daily   Continuous Infusions:  cefTRIAXone (ROCEPHIN)  IV 1 g (12/21/22 1008)   lactated ringers 75 mL/hr at 12/21/22 0607    Procedures/Studies: No results found.  Catarina Hartshorn, DO  [facility]  If 7PM-7AM, please contact night-coverage [email or web address] Password [email or web address] 12/21/2022, 12:35 PM   LOS: 2 days

## 2022-12-21 NOTE — TOC Transition Note (Signed)
Transition of Care [facility]) - Discharge Note   Patient Details  Name: Terrance Miller MRN: [id number] Date of Birth: 1928/05/25  Transition of [facility]) CM/SW Contact:  Villa Herb, LCSWA Phone Number: 12/21/2022, 1:11 PM   Clinical Narrative:    CSW updated that pt will D/C home today. CSW spoke with pts daughter who states they prefer to wait until after the holidays to set up [facility]. CSW educated daughter on reaching out to pts PCP after holidays to get [facility] set up. TOC signing off.   Final next level of care: Home/Self Care Barriers to Discharge: Continued Medical Work up   Patient Goals and CMS Choice Patient states their goals for this hospitalization and ongoing recovery are:: return home CMS Medicare.gov Compare Post Acute Care list provided to:: Patient Choice offered to / list presented to : Patient      Discharge Placement                       Discharge Plan and Services Additional resources added to the After Visit Summary for   In-house Referral: Clinical Social Work Discharge Planning Services: CM Consult                                 Social Drivers of Health (SDOH) Interventions SDOH Screenings   Food Insecurity: No Food Insecurity (12/20/2022)  Housing: Low Risk  (12/20/2022)  Transportation Needs: No Transportation Needs (12/20/2022)  Utilities: Not At Risk (12/20/2022)  Alcohol Screen: Low Risk  (07/25/2022)  Depression (PHQ2-9): Low Risk  (08/25/2022)  Financial Resource Strain: Low Risk  (08/20/2022)  Physical Activity: Insufficiently Active (07/25/2022)  Social Connections: Socially Isolated (07/25/2022)  Stress: No Stress Concern Present (08/20/2022)  Tobacco Use: Medium Risk (12/19/2022)  Health Literacy: Adequate Health Literacy (07/25/2022)     Readmission Risk Interventions    12/20/2022   11:51 AM  Readmission Risk Prevention Plan  Transportation Screening Complete  HRI or Home Care Consult Complete  Social Work Consult for  Recovery Care Planning/Counseling Complete  Palliative Care Screening Not Applicable  Medication Review Oceanographer) Complete

## 2022-12-21 NOTE — Discharge Summary (Signed)
Physician Discharge Summary   Patient: Terrance Miller MRN: [id number] DOB: [birth year]  Admit date:     12/19/2022  Discharge date: 12/21/22  Discharge Physician: Onalee Hua Kyrollos Cordell   PCP: [name], DO   Recommendations at discharge:   Please follow up with primary care provider within 1-2 weeks  Please repeat BMP and CBC in one week   Hospital Course: 89 year old male with a history of hypertension, hyperlipidemia, diabetes mellitus type 2, coronary disease, urinary retention with chronic indwelling Foley catheter, CKD stage III presenting with confusion and hallucinations that began on 12/18/2022.  The patient's daughter also noticed that he has been little bit weaker than usual over the past 2 to 3 days.  There is been no nausea, vomiting, diarrhea, abd pain, chest pain, shortness breath, fevers, chills. At baseline, the patient is able to get out of bed on his own and dress himself.  He does need some assistance with some of his other ADLs.  Daughter states at baseline the patient is able to carry a conversation, but does struggle occasionally with the date.  He has chronic lower extremity edema which she states has been about the same as usual.  She states that he is not taking furosemide any longer.  The patient was recently started on amlodipine 2.5 mg.  Otherwise there is been no other new medication changes. In the ED, the patient had low-grade temperature 99.1 F.  He was hemodynamically stable with oxygen saturation 90% on room air.  WBC 5.8, hemoglobin 9.2, platelets 113.  Sodium 143, potassium 3.7, bicarbonate 21, serum creatinine 1.82.  UA showed> 50 WBC.  The patient was started on ceftriaxone and IV fluids.  His mental status improved.  Daughter stated that his mental status is near baseline at time of d/c  Assessment and Plan: Acute metabolic encephalopathy -Secondary to UTI and acute on chronic renal failure -Patient near baseline at time of dc -UA>50 WBC -Continue IV fluids -B12  954 -folate 23.4   CAUTI/chronic urinary retention -Patient has chronic indwelling Foley catheter -Foley catheter previously changed 11/22/2022 -Foley catheter was changed on 12/19/2022 -Continue empiric ceftriaxone pending culture data -bilateral hydrocelectomy by Dr. Pete Glatter in 11/2021  -Outpatient urology follow-up -d/c home with cefdinir x 5 more days   CKD stage IIIb -Baseline creatinine 1.4-1.7 -Presented with serum creatinine 1.82 -Continued IV fluids   Essential hypertension -Continue amlodipine and carvedilol   Chronic lower extremity edema -09/19/2020 echo EF 55 to 60%, no WMA, grade 1 DD, RVSP 62 -Certainly, pulmonary hypertension is contributing -Currently not on furosemide -check urine protein creatinine ratio--0.42   Macrocytic anemia/MGUS -There is been concern of MDS versus other myeloproliferative disorder -Family has deferred bone marrow biopsy -Continue follow-up at [facility] -continue B12 supplementation   Coronary artery disease -No chest pain presently -Continue aspirin -continue Carvedilol    Impaired glucose tolerance -04/29/2020 hemoglobin A1c 6.3 -Allowing for global glycemic control at this point   Deconditioning -PT eval>>HHPT           Consultants: none Procedures performed: none  Disposition: Home Diet recommendation:  Cardiac diet DISCHARGE MEDICATION: Allergies as of 12/21/2022       Reactions   Clobetasol         Medication List     TAKE these medications    acetaminophen 325 MG tablet Commonly known as: TYLENOL Take 2 tablets (650 mg total) by mouth every 6 (six) hours as needed for mild pain (or Fever >/= 101).   amLODipine  2.5 MG tablet Commonly known as: NORVASC Take 1 tablet (2.5 mg total) by mouth daily.   aspirin EC 81 MG tablet Take 1 tablet (81 mg total) by mouth daily with breakfast. Swallow whole.   bisacodyl 10 MG suppository Commonly known as: DULCOLAX Place 1 suppository (10 mg  total) rectally every Monday, Wednesday, and Friday. What changed:  when to take this reasons to take this   carvedilol 6.25 MG tablet Commonly known as: COREG Take 1 tablet (6.25 mg total) by mouth 2 (two) times daily.   cefdinir 300 MG capsule Commonly known as: OMNICEF Take 1 capsule (300 mg total) by mouth every 12 (twelve) hours. Start taking on: December 22, 2022   cholecalciferol 1000 units tablet Commonly known as: VITAMIN D Take 1,000 Units by mouth daily.   cyanocobalamin 1000 MCG tablet Commonly known as: VITAMIN B12 Take 1,000 mcg by mouth daily.   folic acid 1 MG tablet Commonly known as: FOLVITE Take 1 tablet (1 mg total) by mouth daily.   furosemide 20 MG tablet Commonly known as: Lasix Take 1 tablet (20 mg total) by mouth daily.   latanoprost 0.005 % ophthalmic solution Commonly known as: XALATAN Place 1 drop into both eyes 2 (two) times daily.   loperamide 2 MG capsule Commonly known as: IMODIUM Take 2 mg by mouth as needed for diarrhea or loose stools.   polyethylene glycol 17 g packet Commonly known as: MIRALAX / GLYCOLAX Take 17 g by mouth 2 (two) times daily. What changed:  when to take this reasons to take this   potassium chloride 10 MEQ tablet Commonly known as: KLOR-CON Take 1 tablet (10 mEq total) by mouth daily. Take While taking Lasix/furosemide   rosuvastatin 5 MG tablet Commonly known as: CRESTOR Take 5 mg by mouth daily.   tamsulosin 0.4 MG Caps capsule Commonly known as: FLOMAX Take 1 capsule (0.4 mg total) by mouth daily.        Discharge Exam: Filed Weights   12/19/22 1557 12/20/22 0023 12/21/22 0900  Weight: 95.7 kg 99.8 kg 102.3 kg   HEENT:  [place]/AT, No thrush, no icterus CV:  RRR, no rub, no S3, no S4 Lung:  CTA, no wheeze, no rhonchi Abd:  soft/+BS, NT Ext:  2 + LE edema, no lymphangitis, no synovitis, no rash   Condition at discharge: stable  The results of significant diagnostics from this hospitalization  (including imaging, microbiology, ancillary and laboratory) are listed below for reference.   Imaging Studies: No results found.  Microbiology: Results for orders placed or performed during the hospital encounter of 12/19/22  Urine Culture     Status: None (Preliminary result)   Collection Time: 12/19/22  8:03 PM   Specimen: Urine, Random  Result Value Ref Range Status   Specimen Description   Final    URINE, RANDOM Performed at [facility], [address], [place], [place] [address]    Special Requests   Final    NONE Reflexed from [id number] Performed at [facility], [address] [place], [place] [address]    Culture   Final    CULTURE REINCUBATED FOR BETTER GROWTH Performed at [facility], [address], [place], [place] [address]    Report Status PENDING  Incomplete    Labs: CBC: Recent Labs  Lab 12/19/22 1613 12/20/22 0409  WBC 5.8 4.5  HGB 9.2* 8.0*  HCT 28.1* 25.5*  MCV 103.3* 103.2*  PLT 113* 97*   Basic Metabolic Panel: Recent Labs  Lab 12/19/22 1613 12/20/22 0409 12/21/22 0429  NA 143 143 142  K 3.8 3.7 4.0  CL 113* 115* 115*  CO2 21* 21* 20*  GLUCOSE 111* 156* 98  BUN 29* 30* 28*  CREATININE 1.82* 1.67* 1.79*  CALCIUM 9.0 8.7* 8.8*  MG  --  2.1 1.9  PHOS  --  3.1  --    Liver Function Tests: Recent Labs  Lab 12/20/22 0409  AST 22  ALT 24  ALKPHOS 69  BILITOT 1.1  PROT 6.2*  ALBUMIN 2.9*   CBG: No results for input(s): "GLUCAP" in the last 168 hours.  Discharge time spent: greater than 30 minutes.  Signed: Catarina Hartshorn, MD [facility] 12/21/2022

## 2022-12-21 NOTE — Progress Notes (Signed)
Pt up to [facility] for BM, then into recliner. Pt able to stand on his own, uses walker to ambulate. Required standby assist x1. Pt set up for breakfast, states can feed himself. Denies any c/o pain at present. Chair alarm on for safety, call bell within reach. Advised to call for needs, states understanding.

## 2022-12-21 NOTE — Progress Notes (Signed)
   12/21/22 0900  ReDS Vest / Clip  BMI (Calculated) 36.43  Station Marker D  Ruler Value 38  ReDS Value Range 36 - 40  ReDS Actual Value  (unable to obtain, machine reads "Low Quality" x 3 attempts)

## 2022-12-21 NOTE — Progress Notes (Signed)
Patient and family state understanding of discharge instructions 

## 2022-12-22 ENCOUNTER — Ambulatory Visit: Payer: Medicare Other

## 2022-12-23 ENCOUNTER — Telehealth: Payer: Self-pay | Admitting: *Deleted

## 2022-12-23 LAB — URINE CULTURE: Culture: >=100000 — AB

## 2022-12-23 NOTE — Transitions of Care (Post Inpatient/ED Visit) (Signed)
   12/23/2022  Name: Terrance Miller MRN: [id number] DOB: 1928/02/16  Today's TOC FU Call Status: Today's TOC FU Call Status:: Unsuccessful Call (1st Attempt) Unsuccessful Call (1st Attempt) Date: 12/23/22  Attempted to reach the patient regarding the most recent Inpatient/ED visit.  Follow Up Plan: Additional outreach attempts will be made to reach the patient to complete the Transitions of Care (Post Inpatient/ED visit) call.   Irving Shows [facility], BSN RN Care Manager/ Transition of Care [place]/ [facility] [phone]

## 2022-12-24 ENCOUNTER — Telehealth: Payer: Self-pay | Admitting: *Deleted

## 2022-12-24 DIAGNOSIS — N3090 Cystitis, unspecified without hematuria: Secondary | ICD-10-CM

## 2022-12-24 NOTE — Transitions of Care (Post Inpatient/ED Visit) (Signed)
12/24/2022  Name: Terrance Miller MRN: [id number] DOB: 1928-08-30  Today's TOC FU Call Status: Today's TOC FU Call Status:: Successful TOC FU Call Completed TOC FU Call Complete Date: 12/24/22 Patient's Name and Date of Birth confirmed.  Transition Care Management Follow-up Telephone Call Date of Discharge: 12/21/22 [facility] Type of Discharge: Inpatient Admission Primary Inpatient Discharge Diagnosis:: Acute cystitis without hematuria How have you been since you were released from the hospital?:  (pt states he is feeling better, continues on antibiotic for UTI, foley catheter is patent and draining, pt is ambulating without difficulty, has all meds and taking as prescribed per dtr.) Any questions or concerns?: Yes Patient Questions/Concerns:: would like assistance of social worker to help with placement into facility Patient Questions/Concerns Addressed: Other: (social work referral placed, care guide scheduled for [facility] for 01/05/23 at 145 pm (daughter requested to be called after Christmas))  Items Reviewed: Did you receive and understand the discharge instructions provided?: Yes Medications obtained,verified, and reconciled?: Yes (Medications Reviewed) Any new allergies since your discharge?: No Dietary orders reviewed?: Yes Type of Diet Ordered:: heart healthy Do you have support at home?: Yes People in Home: child(ren), adult Name of Support/Comfort Primary Source: lives with adult daughter Terrance Miller Reviewed importance of drinking adequate fluids, preferably water Reviewed signs/ symptoms of infection, UTI Reviewed all upcoming scheduled appointments Patient's daughter [name] would like one follow up call from [facility] Manager and would like to be after Christmas  Medications Reviewed Today: Medications Reviewed Today     Reviewed by Audrie Gallus, RN (Registered Nurse) on 12/24/22 at 1158  Med List Status: <None>   Medication  Order Taking? Sig Documenting Provider Last Dose Status Informant  acetaminophen (TYLENOL) 325 MG tablet [id number] Yes Take 2 tablets (650 mg total) by mouth every 6 (six) hours as needed for mild pain (or Fever >/= 101). Shon Hale, MD Taking Active Child  amLODipine (NORVASC) 2.5 MG tablet [id number] Yes Take 1 tablet (2.5 mg total) by mouth daily. [name], DO Taking Active Child  aspirin EC 81 MG tablet [id number] Yes Take 1 tablet (81 mg total) by mouth daily with breakfast. Swallow whole. Shon Hale, MD Taking Active Child  bisacodyl (DULCOLAX) 10 MG suppository [id number] Yes Place 1 suppository (10 mg total) rectally every Monday, Wednesday, and Friday.  Patient taking differently: Place 10 mg rectally daily as needed for mild constipation.   Shon Hale, MD Taking Active Child  carvedilol (COREG) 6.25 MG tablet [id number] Yes Take 1 tablet (6.25 mg total) by mouth 2 (two) times daily. Sharee Holster, NP Taking Active Child  cefdinir (OMNICEF) 300 MG capsule [id number] Yes Take 1 capsule (300 mg total) by mouth every 12 (twelve) hours. Catarina Hartshorn, MD Taking Active   cholecalciferol (VITAMIN D) 1000 units tablet [id number] Yes Take 1,000 Units by mouth daily. [provider] Taking Active Child  cyanocobalamin (VITAMIN B12) 1000 MCG tablet [id number] Yes Take 1,000 mcg by mouth daily. [provider] Taking Active Child  folic acid (FOLVITE) 1 MG tablet [id number] Yes Take 1 tablet (1 mg total) by mouth daily. Sharee Holster, NP Taking Active Child  furosemide (LASIX) 20 MG tablet [id number] Yes Take 1 tablet (20 mg total) by mouth daily. [name], DO Taking Active Child  latanoprost (XALATAN) 0.005 % ophthalmic solution [id number] Yes Place 1 drop into both eyes 2 (two) times daily. Sharee Holster, NP Taking Active Child  loperamide (IMODIUM) 2 MG capsule  [id number] Yes Take 2 mg by mouth as needed for diarrhea or loose stools. [provider]  Taking Active Child  polyethylene glycol (MIRALAX / GLYCOLAX) 17 g packet [id number] Yes Take 17 g by mouth 2 (two) times daily.  Patient taking differently: Take 17 g by mouth daily as needed for mild constipation.   Shon Hale, MD Taking Active Child  potassium chloride (KLOR-CON) 10 MEQ tablet [id number] Yes Take 1 tablet (10 mEq total) by mouth daily. Take While taking Lasix/furosemide Sharee Holster, NP Taking Active Child  rosuvastatin (CRESTOR) 5 MG tablet [id number] Yes Take 5 mg by mouth daily. [provider] Taking Active Child  tamsulosin (FLOMAX) 0.4 MG CAPS capsule [id number]  Take 1 capsule (0.4 mg total) by mouth daily. Sharee Holster, NP  Active Child            Home Care and Equipment/Supplies: Were Home Health Services Ordered?: No Any new equipment or medical supplies ordered?: No  Functional Questionnaire: Do you need assistance with bathing/showering or dressing?: No Do you need assistance with meal preparation?: Yes Do you need assistance with eating?: No Do you have difficulty maintaining continence: Yes (foley catheter) Do you need assistance with getting out of bed/getting out of a chair/moving?: No Do you have difficulty managing or taking your medications?: Yes (daughter prefills medications)  Follow up appointments reviewed: PCP Follow-up appointment confirmed?: Yes Date of PCP follow-up appointment?: 12/29/22 Follow-up Provider: Everlene Other DO  @ 110 pm,   pt did have appt for February- RN Care Manager had care guide to schedule a sooner appt Specialist Hospital Follow-up appointment confirmed?: Yes Date of Specialist follow-up appointment?: 01/28/23 Follow-Up Specialty Provider:: foley catheter change Do you need transportation to your follow-up appointment?: No Do you understand care options if your condition(s) worsen?: Yes-patient verbalized understanding  SDOH Interventions Today    Flowsheet Row Most Recent Value  SDOH  Interventions   Food Insecurity Interventions Intervention Not Indicated  Housing Interventions Intervention Not Indicated  Transportation Interventions Intervention Not Indicated  Utilities Interventions Intervention Not Indicated      Irving Shows [facility], BSN RN Care Manager/ Transition of Care [place]/ [facility] [phone]

## 2022-12-26 ENCOUNTER — Inpatient Hospital Stay (HOSPITAL_COMMUNITY)
Admission: EM | Admit: 2022-12-26 | Discharge: 2023-01-05 | DRG: 193 | Disposition: A | Discharge status: Skilled Nursing Facility | Payer: Medicare Other | Attending: Family Medicine | Admitting: Family Medicine

## 2022-12-26 ENCOUNTER — Telehealth: Payer: Self-pay | Admitting: *Deleted

## 2022-12-26 ENCOUNTER — Other Ambulatory Visit: Payer: Self-pay

## 2022-12-26 ENCOUNTER — Encounter (HOSPITAL_COMMUNITY): Payer: Self-pay | Admitting: Emergency Medicine

## 2022-12-26 ENCOUNTER — Ambulatory Visit: Payer: Self-pay | Admitting: Family Medicine

## 2022-12-26 ENCOUNTER — Ambulatory Visit: Payer: Medicare Other | Admitting: Physician Assistant

## 2022-12-26 DIAGNOSIS — Z8249 Family history of ischemic heart disease and other diseases of the circulatory system: Secondary | ICD-10-CM

## 2022-12-26 DIAGNOSIS — R0989 Other specified symptoms and signs involving the circulatory and respiratory systems: Secondary | ICD-10-CM | POA: Diagnosis not present

## 2022-12-26 DIAGNOSIS — N1832 Chronic kidney disease, stage 3b: Secondary | ICD-10-CM | POA: Diagnosis not present

## 2022-12-26 DIAGNOSIS — I2510 Atherosclerotic heart disease of native coronary artery without angina pectoris: Secondary | ICD-10-CM | POA: Diagnosis not present

## 2022-12-26 DIAGNOSIS — D696 Thrombocytopenia, unspecified: Secondary | ICD-10-CM | POA: Diagnosis present

## 2022-12-26 DIAGNOSIS — R918 Other nonspecific abnormal finding of lung field: Secondary | ICD-10-CM | POA: Diagnosis not present

## 2022-12-26 DIAGNOSIS — E66812 Obesity, class 2: Secondary | ICD-10-CM | POA: Diagnosis present

## 2022-12-26 DIAGNOSIS — R0602 Shortness of breath: Secondary | ICD-10-CM | POA: Diagnosis not present

## 2022-12-26 DIAGNOSIS — I252 Old myocardial infarction: Secondary | ICD-10-CM

## 2022-12-26 DIAGNOSIS — J1001 Influenza due to other identified influenza virus with the same other identified influenza virus pneumonia: Secondary | ICD-10-CM | POA: Diagnosis not present

## 2022-12-26 DIAGNOSIS — I499 Cardiac arrhythmia, unspecified: Secondary | ICD-10-CM | POA: Diagnosis not present

## 2022-12-26 DIAGNOSIS — J101 Influenza due to other identified influenza virus with other respiratory manifestations: Secondary | ICD-10-CM | POA: Diagnosis present

## 2022-12-26 DIAGNOSIS — Z1152 Encounter for screening for COVID-19: Secondary | ICD-10-CM

## 2022-12-26 DIAGNOSIS — E876 Hypokalemia: Secondary | ICD-10-CM | POA: Diagnosis not present

## 2022-12-26 DIAGNOSIS — I5033 Acute on chronic diastolic (congestive) heart failure: Secondary | ICD-10-CM | POA: Diagnosis not present

## 2022-12-26 DIAGNOSIS — Z66 Do not resuscitate: Secondary | ICD-10-CM | POA: Diagnosis not present

## 2022-12-26 DIAGNOSIS — E785 Hyperlipidemia, unspecified: Secondary | ICD-10-CM | POA: Diagnosis not present

## 2022-12-26 DIAGNOSIS — D61818 Other pancytopenia: Secondary | ICD-10-CM | POA: Diagnosis not present

## 2022-12-26 DIAGNOSIS — R410 Disorientation, unspecified: Secondary | ICD-10-CM

## 2022-12-26 DIAGNOSIS — I130 Hypertensive heart and chronic kidney disease with heart failure and stage 1 through stage 4 chronic kidney disease, or unspecified chronic kidney disease: Secondary | ICD-10-CM | POA: Diagnosis not present

## 2022-12-26 DIAGNOSIS — R531 Weakness: Principal | ICD-10-CM

## 2022-12-26 DIAGNOSIS — R6889 Other general symptoms and signs: Secondary | ICD-10-CM | POA: Diagnosis not present

## 2022-12-26 DIAGNOSIS — R339 Retention of urine, unspecified: Secondary | ICD-10-CM | POA: Diagnosis present

## 2022-12-26 DIAGNOSIS — J129 Viral pneumonia, unspecified: Secondary | ICD-10-CM | POA: Diagnosis present

## 2022-12-26 DIAGNOSIS — E1122 Type 2 diabetes mellitus with diabetic chronic kidney disease: Secondary | ICD-10-CM | POA: Diagnosis not present

## 2022-12-26 DIAGNOSIS — N390 Urinary tract infection, site not specified: Secondary | ICD-10-CM | POA: Diagnosis not present

## 2022-12-26 DIAGNOSIS — L89892 Pressure ulcer of other site, stage 2: Secondary | ICD-10-CM | POA: Diagnosis not present

## 2022-12-26 DIAGNOSIS — D631 Anemia in chronic kidney disease: Secondary | ICD-10-CM | POA: Diagnosis not present

## 2022-12-26 DIAGNOSIS — J9601 Acute respiratory failure with hypoxia: Secondary | ICD-10-CM | POA: Diagnosis present

## 2022-12-26 DIAGNOSIS — Z743 Need for continuous supervision: Secondary | ICD-10-CM | POA: Diagnosis not present

## 2022-12-26 DIAGNOSIS — R5381 Other malaise: Secondary | ICD-10-CM | POA: Diagnosis present

## 2022-12-26 DIAGNOSIS — Z515 Encounter for palliative care: Secondary | ICD-10-CM | POA: Diagnosis not present

## 2022-12-26 DIAGNOSIS — Z79899 Other long term (current) drug therapy: Secondary | ICD-10-CM

## 2022-12-26 DIAGNOSIS — T502X5A Adverse effect of carbonic-anhydrase inhibitors, benzothiadiazides and other diuretics, initial encounter: Secondary | ICD-10-CM | POA: Diagnosis not present

## 2022-12-26 DIAGNOSIS — Z960 Presence of urogenital implants: Secondary | ICD-10-CM | POA: Diagnosis present

## 2022-12-26 DIAGNOSIS — Z7982 Long term (current) use of aspirin: Secondary | ICD-10-CM

## 2022-12-26 DIAGNOSIS — I700 Atherosclerosis of aorta: Secondary | ICD-10-CM | POA: Diagnosis not present

## 2022-12-26 DIAGNOSIS — Z87440 Personal history of urinary (tract) infections: Secondary | ICD-10-CM

## 2022-12-26 DIAGNOSIS — Z751 Person awaiting admission to adequate facility elsewhere: Secondary | ICD-10-CM

## 2022-12-26 DIAGNOSIS — Z87891 Personal history of nicotine dependence: Secondary | ICD-10-CM | POA: Diagnosis not present

## 2022-12-26 DIAGNOSIS — R059 Cough, unspecified: Secondary | ICD-10-CM | POA: Diagnosis not present

## 2022-12-26 DIAGNOSIS — N179 Acute kidney failure, unspecified: Secondary | ICD-10-CM | POA: Diagnosis not present

## 2022-12-26 DIAGNOSIS — Z7189 Other specified counseling: Secondary | ICD-10-CM | POA: Diagnosis not present

## 2022-12-26 DIAGNOSIS — Z6835 Body mass index (BMI) 35.0-35.9, adult: Secondary | ICD-10-CM

## 2022-12-26 DIAGNOSIS — N5089 Other specified disorders of the male genital organs: Secondary | ICD-10-CM | POA: Diagnosis present

## 2022-12-26 DIAGNOSIS — I509 Heart failure, unspecified: Secondary | ICD-10-CM | POA: Diagnosis not present

## 2022-12-26 DIAGNOSIS — F0390 Unspecified dementia without behavioral disturbance: Secondary | ICD-10-CM | POA: Diagnosis present

## 2022-12-26 DIAGNOSIS — Z833 Family history of diabetes mellitus: Secondary | ICD-10-CM

## 2022-12-26 LAB — URINALYSIS, W/ REFLEX TO CULTURE (INFECTION SUSPECTED)
Bilirubin Urine: NEGATIVE
Glucose, UA: NEGATIVE mg/dL
Hgb urine dipstick: NEGATIVE
Ketones, ur: NEGATIVE mg/dL
Leukocytes,Ua: NEGATIVE
Nitrite: NEGATIVE
Protein, ur: 30 mg/dL — AB
Specific Gravity, Urine: 1.014 (ref 1.005–1.030)
pH: 5 (ref 5.0–8.0)

## 2022-12-26 LAB — CBC
HCT: 28 % — ABNORMAL LOW (ref 39.0–52.0)
Hemoglobin: 9.2 g/dL — ABNORMAL LOW (ref 13.0–17.0)
MCH: 33.9 pg (ref 26.0–34.0)
MCHC: 32.9 g/dL (ref 30.0–36.0)
MCV: 103.3 fL — ABNORMAL HIGH (ref 80.0–100.0)
Platelets: 102 10*3/uL — ABNORMAL LOW (ref 150–400)
RBC: 2.71 MIL/uL — ABNORMAL LOW (ref 4.22–5.81)
RDW: 28.9 % — ABNORMAL HIGH (ref 11.5–15.5)
WBC: 5.4 10*3/uL (ref 4.0–10.5)
nRBC: 2 % — ABNORMAL HIGH (ref 0.0–0.2)

## 2022-12-26 LAB — COMPREHENSIVE METABOLIC PANEL
ALT: 24 U/L (ref 0–44)
AST: 27 U/L (ref 15–41)
Albumin: 3.4 g/dL — ABNORMAL LOW (ref 3.5–5.0)
Alkaline Phosphatase: 74 U/L (ref 38–126)
Anion gap: 9 (ref 5–15)
BUN: 23 mg/dL (ref 8–23)
CO2: 21 mmol/L — ABNORMAL LOW (ref 22–32)
Calcium: 9 mg/dL (ref 8.9–10.3)
Chloride: 110 mmol/L (ref 98–111)
Creatinine, Ser: 1.5 mg/dL — ABNORMAL HIGH (ref 0.61–1.24)
GFR, Estimated: 43 mL/min — ABNORMAL LOW (ref >60)
Glucose, Bld: 89 mg/dL (ref 70–99)
Potassium: 3.8 mmol/L (ref 3.5–5.1)
Sodium: 140 mmol/L (ref 135–145)
Total Bilirubin: 1.5 mg/dL — ABNORMAL HIGH (ref <1.2)
Total Protein: 7.1 g/dL (ref 6.5–8.1)

## 2022-12-26 LAB — CBG MONITORING, ED: Glucose-Capillary: 85 mg/dL (ref 70–99)

## 2022-12-26 LAB — RESP PANEL BY RT-PCR (RSV, FLU A&B, COVID)  RVPGX2
Influenza A by PCR: NEGATIVE
Influenza B by PCR: NEGATIVE
Resp Syncytial Virus by PCR: NEGATIVE
SARS Coronavirus 2 by RT PCR: NEGATIVE

## 2022-12-26 NOTE — ED Notes (Signed)
ED Provider at bedside. 

## 2022-12-26 NOTE — Telephone Encounter (Addendum)
Copied from CRM [id number]. Topic: Clinical - Red Word Triage >> Dec 26, 2022  8:28 AM Terrance Miller wrote: Red Word that prompted transfer to Nurse Triage: Pt may not be in his right state of mind and his daughter is concerned it's due to the pain from a UTI   Chief Complaint: " I think my dad has UTI. He is  talking to people  that are not hear.  Aggression and  Anger tearing  the blinds. Recently DC on 12/21/22 Symptoms: He has a urinary  catheter.   RN Agent was able to schedule an appointment. Daughter states she needed later appointment for her husband t be able to help.  Dr Adriana Simas decided patient will need to to got to ER since he likely will be admitted if UTI has made him unstable.   Daughter was notifed.      Disposition: [x] ED /[] Urgent Care (no appt availability in office) / [] Appointment(In office/virtual)/ []  [facility]/ [] Home Care/ [] Refused Recommended Disposition /[] [facility]/ []  Follow-up with PCP Additional Notes:  Reason for Disposition  [1] Acting confused (e.g., disoriented, slurred speech) AND [2] brief (now gone)  Answer Assessment - Initial Assessment Questions 1. LEVEL OF CONSCIOUSNESS: "How is he (she, the patient) acting right now?" (e.g., alert-oriented, confused, lethargic, stuporous, comatose)      Calm and  confused , Hallucinations 2. ONSET: "When did the confusion start?"  (minutes, hours, days)      12/23/22 3. PATTERN "Does this come and go, or has it been constant since it started?"  "Is it present now?"    6. CAUSE: "What do you think is causing the confusion?"      Think  it  UTI and he was the same 7. OTHER SYMPTOMS: "Are there any other symptoms?" (e.g., difficulty breathing, headache, fever, weakness)     Denies any above.  Protocols used: Confusion - Delirium-A-AH

## 2022-12-26 NOTE — Telephone Encounter (Signed)
Patient and family notified via E2C2 when calling to schedule an appointment

## 2022-12-26 NOTE — ED Triage Notes (Signed)
Pt arrives from home via [facility] with reports of AMS x 3 days. Per family pt was d/c 3 days ago and since that time pt has been increasingly weak. Pt has chronic foley.

## 2022-12-26 NOTE — Telephone Encounter (Signed)
Copied from CRM [id number]. Topic: Clinical - Medical Advice >> Dec 26, 2022  8:21 AM Fonda Kinder J wrote: Reason for CRM: [name] called in on pt's behalf because he was released from the hospital for a UTI on last week. She states the pt has finished his antibiotics but has gotten worse since then and has not been in his right state of mind. She says she doesn't know what she should do and the pt has appointment on 12/23 she is requesting a callback at [phone]

## 2022-12-26 NOTE — Telephone Encounter (Signed)
Reason for Disposition . Very strange or paranoid behavior  Protocols used: Confusion - Delirium-A-AH

## 2022-12-26 NOTE — Telephone Encounter (Signed)
Terrance Sams, DO   Needs to go back to the hospital if he is encephalopathic.

## 2022-12-26 NOTE — ED Provider Notes (Signed)
[place] EMERGENCY DEPARTMENT AT [facility] Provider Note   CSN: [id number] Arrival date & time: 12/26/22  1751     History {Add pertinent medical, surgical, social history, OB history to HPI:1} Chief Complaint  Patient presents with   Altered Mental Status    Terrance Miller is a 89 y.o. male.  With history of CAD, MI, CKD, MGUS, chronic indwelling Foley type 2 diabetes who presents to the ED for generalized weakness.  Recently admitted for confusion and hallucinations and generalized weakness suspected to be due to UTI.  Was admitted on the 13th and discharged on the 15th.  Has been progressively weak and confused at home since the time of his discharge.  Discharged with cefdinir x 5 days.  Patient himself has no systemic complaints at this time.  He is unable to provide additional history secondary to confusion   Altered Mental Status      Home Medications Prior to Admission medications   Medication Sig Start Date End Date Taking? Authorizing Provider  acetaminophen (TYLENOL) 325 MG tablet Take 2 tablets (650 mg total) by mouth every 6 (six) hours as needed for mild pain (or Fever >/= 101). 08/02/22   Emokpae, Courage, MD  amLODipine (NORVASC) 2.5 MG tablet Take 1 tablet (2.5 mg total) by mouth daily. 11/25/22   [name], DO  aspirin EC 81 MG tablet Take 1 tablet (81 mg total) by mouth daily with breakfast. Swallow whole. 08/02/22   Shon Hale, MD  bisacodyl (DULCOLAX) 10 MG suppository Place 1 suppository (10 mg total) rectally every Monday, Wednesday, and Friday. Patient taking differently: Place 10 mg rectally daily as needed for mild constipation. 01/31/22   Shon Hale, MD  carvedilol (COREG) 6.25 MG tablet Take 1 tablet (6.25 mg total) by mouth 2 (two) times daily. 08/20/22   Sharee Holster, NP  cefdinir (OMNICEF) 300 MG capsule Take 1 capsule (300 mg total) by mouth every 12 (twelve) hours. 12/22/22   Catarina Hartshorn, MD  cholecalciferol (VITAMIN D) 1000  units tablet Take 1,000 Units by mouth daily.    [provider]  cyanocobalamin (VITAMIN B12) 1000 MCG tablet Take 1,000 mcg by mouth daily.    [provider]  folic acid (FOLVITE) 1 MG tablet Take 1 tablet (1 mg total) by mouth daily. 08/20/22   Sharee Holster, NP  furosemide (LASIX) 20 MG tablet Take 1 tablet (20 mg total) by mouth daily. 08/25/22 08/25/23  [name], DO  latanoprost (XALATAN) 0.005 % ophthalmic solution Place 1 drop into both eyes 2 (two) times daily. 08/20/22   Sharee Holster, NP  loperamide (IMODIUM) 2 MG capsule Take 2 mg by mouth as needed for diarrhea or loose stools.    [provider]  polyethylene glycol (MIRALAX / GLYCOLAX) 17 g packet Take 17 g by mouth 2 (two) times daily. Patient taking differently: Take 17 g by mouth daily as needed for mild constipation. 01/31/22   Shon Hale, MD  potassium chloride (KLOR-CON) 10 MEQ tablet Take 1 tablet (10 mEq total) by mouth daily. Take While taking Lasix/furosemide 08/20/22   Sharee Holster, NP  rosuvastatin (CRESTOR) 5 MG tablet Take 5 mg by mouth daily.    [provider]  tamsulosin (FLOMAX) 0.4 MG CAPS capsule Take 1 capsule (0.4 mg total) by mouth daily. 08/20/22   Sharee Holster, NP      Allergies    Clobetasol    Review of Systems   Review of  Systems  Physical Exam Updated Vital Signs BP (!) 163/82   Pulse (!) 53   Temp (!) 97 F (36.1 C) (Oral)   Resp 16   SpO2 100%  Physical Exam Vitals and nursing note reviewed.  HENT:     Head: Normocephalic and atraumatic.  Eyes:     Pupils: Pupils are equal, round, and reactive to light.  Cardiovascular:     Rate and Rhythm: Normal rate and regular rhythm.  Pulmonary:     Effort: Pulmonary effort is normal.     Breath sounds: Normal breath sounds.  Abdominal:     Palpations: Abdomen is soft.     Tenderness: There is no abdominal tenderness.  Genitourinary:    Comments: Foley catheter in place draining clear  yellow urine in collection bag Skin:    General: Skin is warm and dry.  Neurological:     General: No focal deficit present.     Mental Status: He is alert.     Sensory: No sensory deficit.     Motor: No weakness.     Comments: Oriented to self only  Psychiatric:        Mood and Affect: Mood normal.     ED Results / Procedures / Treatments   Labs (all labs ordered are listed, but only abnormal results are displayed) Labs Reviewed  COMPREHENSIVE METABOLIC PANEL - Abnormal; Notable for the following components:      Result Value   CO2 21 (*)    Creatinine, Ser 1.50 (*)    Albumin 3.4 (*)    Total Bilirubin 1.5 (*)    GFR, Estimated 43 (*)    All other components within normal limits  CBC - Abnormal; Notable for the following components:   RBC 2.71 (*)    Hemoglobin 9.2 (*)    HCT 28.0 (*)    MCV 103.3 (*)    RDW 28.9 (*)    Platelets 102 (*)    nRBC 2.0 (*)    All other components within normal limits  RESP PANEL BY RT-PCR (RSV, FLU A&B, COVID)  RVPGX2  URINALYSIS, W/ REFLEX TO CULTURE (INFECTION SUSPECTED)  CBG MONITORING, ED    EKG None  Radiology No results found.  Procedures Procedures  {Document cardiac monitor, telemetry assessment procedure when appropriate:1}  Medications Ordered in ED Medications - No data to display  ED Course/ Medical Decision Making/ A&P   {   Click here for ABCD2, HEART and other calculatorsREFRESH Note before signing :1}                              Medical Decision Making 89 year old male with history as above recently discharged for acute metabolic encephalopathy suspect to be due to UTI.  Discharged 5 days ago after returning to cognitive baseline.  Discharged on 5 days of outpatient antibiotics.  Has been increasingly weak and confused at home.  No falls or recent trauma reported.  Afebrile and hypertensive on exam.  Presentation most concerning for recurrent metabolic encephalopathy with underlying infectious etiology.  Will  evaluate for pneumonia or UTI and respiratory illness and obtain basic laboratory workup.  Readmission likely considering altered mental status  Amount and/or Complexity of Data Reviewed Labs: ordered.   ***  {Document critical care time when appropriate:1} {Document review of labs and clinical decision tools ie heart score, Chads2Vasc2 etc:1}  {Document your independent review of radiology images, and any outside records:1} {Document your  discussion with family members, caretakers, and with consultants:1} {Document social determinants of health affecting pt's care:1} {Document your decision making why or why not admission, treatments were needed:1} Final Clinical Impression(s) / ED Diagnoses Final diagnoses:  None    Rx / DC Orders ED Discharge Orders     None

## 2022-12-27 ENCOUNTER — Emergency Department (HOSPITAL_COMMUNITY): Payer: Medicare Other

## 2022-12-27 DIAGNOSIS — R918 Other nonspecific abnormal finding of lung field: Secondary | ICD-10-CM | POA: Diagnosis not present

## 2022-12-27 DIAGNOSIS — R531 Weakness: Secondary | ICD-10-CM | POA: Diagnosis not present

## 2022-12-27 DIAGNOSIS — R410 Disorientation, unspecified: Secondary | ICD-10-CM | POA: Diagnosis not present

## 2022-12-27 NOTE — ED Notes (Signed)
Pt unable to feed himself due to confusion. Pt can be fed and will chew and swallow with no issue. Was fed half a breakfast tray and orange juice.

## 2022-12-27 NOTE — Evaluation (Signed)
Physical Therapy Evaluation Patient Details Name: Terrance Miller MRN: [id number] DOB: September 19, 1928 Today's Date: 12/27/2022  History of Present Illness  Terrance Miller is a 89 y.o. male.  With history of CAD, MI, CKD, MGUS, chronic indwelling Foley type 2 diabetes who presents to the ED for generalized weakness.  Recently admitted for confusion and hallucinations and generalized weakness suspected to be due to UTI.  Was admitted on the 13th and discharged on the 15th.  Has been progressively weak and confused at home since the time of his discharge.  Discharged with cefdinir x 5 days.  Patient himself has no systemic complaints at this time.  He is unable to provide additional history secondary to confusion    Clinical Impression  On arrival to the ED; nursing present to assist patient with eating his breakfast.  Patient is alert and can answer his DOB correctly but according to chart review all his other answers are nonsensical or incorrect.  Patient is hallucinating he is driving on the highway during assessment.  PT with nursing assists patient with supine to sit with moderate assist for legs but he states he is "going to crash" and "needs to get off the highway".  Patient is resistant to transfer and needs min to mod assist to maintain sitting balance and then return back to supine.  He does not complain of pain throughout assessment.  patient left in bed with call button in reach and nursing aware of mobility status. Patient will benefit from continued skilled therapy services during the remainder of his hospital stay and at the next recommended venue of care to address deficits and promote return to optimal function.            If plan is discharge home, recommend the following: A lot of help with walking and/or transfers;A lot of help with bathing/dressing/bathroom;Supervision due to cognitive status;Help with stairs or ramp for entrance;Assistance with cooking/housework;Assistance with  feeding;Direct supervision/assist for financial management;Direct supervision/assist for medications management;Assist for transportation   Can travel by private vehicle   No    Equipment Recommendations None recommended by PT  Recommendations for Other Services       Functional Status Assessment Patient has had a recent decline in their functional status and demonstrates the ability to make significant improvements in function in a reasonable and predictable amount of time.     Precautions / Restrictions Precautions Precautions: Fall Restrictions Weight Bearing Restrictions Per Provider Order: No      Mobility  Bed Mobility Overal bed mobility: Needs Assistance Bed Mobility: Supine to Sit (confused; thinks he is going to "wreck")     Supine to sit: Mod assist Sit to supine: Mod assist   General bed mobility comments: resists bed mobility due to confusion    Transfers Overall transfer level: Needs assistance Equipment used:  (unable to cooperate due to confusoin)                    Ambulation/Gait                  Stairs            Wheelchair Mobility     Tilt Bed    Modified Rankin (Stroke Patients Only)       Balance Overall balance assessment: Needs assistance Sitting-balance support: Feet unsupported, No upper extremity supported Sitting balance-Leahy Scale: Poor Sitting balance - Comments: At EOB Postural control: Left lateral lean Standing balance support:  (did not attempt standing balance)  Pertinent Vitals/Pain Pain Assessment Pain Assessment: No/denies pain    Home Living Family/patient expects to be discharged to:: Skilled nursing facility Living Arrangements: Children (from previous chart review) Available Help at Discharge: Family;Available 24 hours/day Type of Home: House Home Access: Stairs to enter Entrance Stairs-Rails: None Entrance Stairs-Number of Steps:  3 Alternate Level Stairs-Number of Steps: 4 Home Layout: Able to live on main level with bedroom/bathroom;Two level Home Equipment: Toilet riser;Cane - single point;Rolling Walker (2 wheels);Rollator (4 wheels) Additional Comments: Information taken from previous admission with pt confirmation today.    Prior Function Prior Level of Function : Needs assist  Cognitive Assist : Mobility (cognitive)                   Extremity/Trunk Assessment   Upper Extremity Assessment Upper Extremity Assessment: Generalized weakness    Lower Extremity Assessment Lower Extremity Assessment: Generalized weakness (swelling bilaterally)    Cervical / Trunk Assessment Cervical / Trunk Assessment: Kyphotic  Communication   Communication Communication: No apparent difficulties Cueing Techniques: Verbal cues;Gestural cues;Tactile cues  Cognition Arousal: Alert (confused and hallucinating that he is driving on highway) Behavior During Therapy:  (confused) Overall Cognitive Status: No family/caregiver present to determine baseline cognitive functioning                                 General Comments: confused and hallucinationg; can answer DOB but all other answers are nonsensical or wrong per chart review ie " he states he does not have childrren"        General Comments General comments (skin integrity, edema, etc.): swelling and skin discolaration of legs bilaterally    Exercises     Assessment/Plan    PT Assessment    PT Problem List Decreased strength;Decreased activity tolerance;Decreased balance;Decreased mobility;Decreased cognition;Decreased safety awareness       PT Treatment Interventions DME instruction;Gait training;Functional mobility training;Therapeutic activities;Therapeutic exercise;Balance training;Neuromuscular re-education;Cognitive remediation;Patient/family education    PT Goals (Current goals can be found in the Care Plan section)  Acute Rehab PT  Goals Patient Stated Goal: patient unable to verbalize goal Time For Goal Achievement: 01/09/23    Frequency Min 3X/week     Co-evaluation               AM-PAC PT "6 Clicks" Mobility  Outcome Measure Help needed turning from your back to your side while in a flat bed without using bedrails?: A Lot Help needed moving from lying on your back to sitting on the side of a flat bed without using bedrails?: A Lot Help needed moving to and from a bed to a chair (including a wheelchair)?: A Lot Help needed standing up from a chair using your arms (e.g., wheelchair or bedside chair)?: A Lot Help needed to walk in hospital room?: A Lot Help needed climbing 3-5 steps with a railing? : A Lot 6 Click Score: 12    End of Session   Activity Tolerance: Other (comment) (limited by confusion and hallucinations) Patient left: in bed;with nursing/sitter in room Nurse Communication: Mobility status PT Visit Diagnosis: Other abnormalities of gait and mobility (R26.89);Other (comment)    Time: 7253-6644 PT Time Calculation (min) (ACUTE ONLY): 15 min   Charges:   PT Evaluation $PT Eval Low Complexity: 1 Low   PT General Charges $$ ACUTE PT VISIT: 1 Visit         10:30 AM, 12/27/22 Madalee Altmann Small Melony Tenpas MPT [place]  physical therapy [place] [phone]

## 2022-12-27 NOTE — ED Notes (Signed)
Attempted to feed patient dinner tray. Patient ate approx 5 bites of food, stated he didn't want anymore.

## 2022-12-27 NOTE — ED Provider Notes (Signed)
Emergency Medicine Observation Re-evaluation Note  Terrance Miller is a 89 y.o. male, seen on rounds today.  Pt initially presented to the ED for complaints of weakness Currently, the patient is resting comfortably.  Physical Exam  BP (!) 173/87   Pulse (!) 57   Temp (!) 97 F (36.1 C) (Oral)   Resp 16   SpO2 97%  Physical Exam General: No distress  Psych: Calm  ED Course / MDM  EKG:EKG Interpretation Date/Time:  Friday December 26 2022 19:41:04 EST Ventricular Rate:  56 PR Interval:  66 QRS Duration:  96 QT Interval:  483 QTC Calculation: 467 R Axis:   60  Text Interpretation: Normal sinus rhythm Short PR interval Nonspecific repol abnrm, inferolateral lds 12 Lead; Mason-Likar Confirmed by Estelle June [phone]) on 12/27/2022 2:32:21 AM  I have reviewed the labs performed to date as well as medications administered while in observation.  Recent changes in the last 24 hours include none, patient is awaiting a.m social work evaluation after presenting yesterday due to weakness following recent hospitalization.  Plan  Current plan is for social work evaluation.    Gerhard Munch, MD 12/27/22 [phone]

## 2022-12-27 NOTE — ED Notes (Signed)
Pt fed lunch. Ate entire tray and drink. Oncoming staff will be notified that patient can be calmed by talking about tractors and baling hay. He specifically likes talking about the differences in the processes of round baling vs square baling. He will mention plastic wrapping round bales and likes to do so, but is open-minded to the downsides of plastic wrap.

## 2022-12-28 DIAGNOSIS — R531 Weakness: Secondary | ICD-10-CM | POA: Diagnosis not present

## 2022-12-28 DIAGNOSIS — R410 Disorientation, unspecified: Secondary | ICD-10-CM | POA: Diagnosis not present

## 2022-12-28 LAB — CBG MONITORING, ED
Glucose-Capillary: 60 mg/dL — ABNORMAL LOW (ref 70–99)
Glucose-Capillary: 79 mg/dL (ref 70–99)
Glucose-Capillary: 75 mg/dL (ref 70–99)
Glucose-Capillary: 72 mg/dL (ref 70–99)

## 2022-12-28 MED ORDER — POLYETHYLENE GLYCOL 3350 17 G PO PACK
17.0000 g | PACK | Freq: Two times a day (BID) | ORAL | Status: DC
  Administered 2022-12-28 – 2022-12-31 (×3): 17 g via ORAL
  Filled 2022-12-28 (×3): qty 1

## 2022-12-28 MED ORDER — ROSUVASTATIN CALCIUM 10 MG PO TABS
5.0000 mg | ORAL_TABLET | Freq: Every day | ORAL | Status: DC
  Administered 2022-12-28 – 2023-01-05 (×9): 5 mg via ORAL
  Filled 2022-12-28 (×9): qty 1

## 2022-12-28 MED ORDER — TAMSULOSIN HCL 0.4 MG PO CAPS
0.4000 mg | ORAL_CAPSULE | Freq: Every day | ORAL | Status: DC
  Administered 2022-12-28 – 2023-01-05 (×9): 0.4 mg via ORAL
  Filled 2022-12-28 (×9): qty 1

## 2022-12-28 MED ORDER — ENSURE ENLIVE PO LIQD
237.0000 mL | Freq: Two times a day (BID) | ORAL | Status: DC
Start: 2022-12-29 — End: 2023-01-05
  Administered 2022-12-29 – 2023-01-05 (×13): 237 mL via ORAL
  Filled 2022-12-28 (×13): qty 237

## 2022-12-28 MED ORDER — DEXTROSE 50 % IV SOLN: 25.0000 mL | Freq: Once | INTRAVENOUS | Status: AC

## 2022-12-28 MED ORDER — LATANOPROST 0.005 % OP SOLN
1.0000 [drp] | Freq: Two times a day (BID) | OPHTHALMIC | Status: DC
  Administered 2022-12-28 – 2023-01-05 (×16): 1 [drp] via OPHTHALMIC
  Filled 2022-12-28 (×4): qty 2.5

## 2022-12-28 MED ORDER — DEXTROSE 50 % IV SOLN
INTRAVENOUS | Status: AC
  Administered 2022-12-28: 25 mL via INTRAVENOUS
  Filled 2022-12-28: qty 50

## 2022-12-28 MED ORDER — ACETAMINOPHEN 325 MG PO TABS: 650.0000 mg | ORAL_TABLET | Freq: Four times a day (QID) | ORAL | Status: DC | PRN

## 2022-12-28 MED ORDER — AMLODIPINE BESYLATE 5 MG PO TABS
2.5000 mg | ORAL_TABLET | Freq: Every day | ORAL | Status: DC
  Administered 2022-12-28 – 2023-01-05 (×9): 2.5 mg via ORAL
  Filled 2022-12-28 (×9): qty 1

## 2022-12-28 MED ORDER — ASPIRIN 81 MG PO TBEC
81.0000 mg | DELAYED_RELEASE_TABLET | Freq: Every day | ORAL | Status: DC
  Administered 2022-12-29 – 2023-01-05 (×8): 81 mg via ORAL
  Filled 2022-12-28 (×8): qty 1

## 2022-12-28 MED ORDER — BISACODYL 10 MG RE SUPP
10.0000 mg | RECTAL | Status: DC
  Administered 2022-12-29: 10 mg via RECTAL
  Filled 2022-12-28: qty 1

## 2022-12-28 NOTE — ED Notes (Signed)
Pt removed gown, placed pt back in gown as pt was naked

## 2022-12-28 NOTE — ED Notes (Signed)
Cbg now 60

## 2022-12-28 NOTE — ED Notes (Signed)
Patient cleaned of stool incontinence. Full bed bath given with 2+ assist. Pericare and catheter care completed. Patient placed on hospital bed for comfort

## 2022-12-28 NOTE — ED Provider Notes (Signed)
Emergency Medicine Observation Re-evaluation Note  Terrance Miller is a 89 y.o. male, seen on rounds today.  Pt initially presented to the ED for complaints of Altered Mental Status Currently, the patient is resting.  Physical Exam  BP (!) 163/75 (BP Location: Right Arm)   Pulse 61   Temp (!) 97.5 F (36.4 C) (Axillary)   Resp 18   SpO2 98%  Physical Exam General: Calm Cardiac: Regular rate and rhythm Lungs: No increased work of breathing Psych: Calm  ED Course / MDM  EKG:EKG Interpretation Date/Time:  Friday December 26 2022 19:41:04 EST Ventricular Rate:  56 PR Interval:  66 QRS Duration:  96 QT Interval:  483 QTC Calculation: 467 R Axis:   60  Text Interpretation: Normal sinus rhythm Short PR interval Nonspecific repol abnrm, inferolateral lds 12 Lead; Mason-Likar Confirmed by Estelle June [phone]) on 12/27/2022 2:32:21 AM  I have reviewed the labs performed to date as well as medications administered while in observation.  Recent changes in the last 24 hours include physical therapy assessment with recommendation for ongoing therapeutic effort.  Plan  Current plan is for placement.    Gerhard Munch, MD 12/28/22 [phone]

## 2022-12-28 NOTE — ED Notes (Signed)
Pt checked for BM and was clean, repositioned patient in the bed and obtained a new set of vitals. Pt back to resting currently.

## 2022-12-28 NOTE — ED Notes (Signed)
Gave pt a ensure due to poor eating. Pt took in most of the ensure. Resting in bed comfortably

## 2022-12-28 NOTE — ED Notes (Signed)
Family at bedside. Encouraged pt to eat, but pt did not want anything at this time

## 2022-12-28 NOTE — ED Notes (Signed)
Pt appears more lethargic than baseline from earlier, cbg check revealed cbg was 60. Verbal by MD for Half amp of D50

## 2022-12-28 NOTE — ED Notes (Addendum)
Called pts daughter and reviewed med rec with her, verified medications, pharmacy has left for the day, MD made aware

## 2022-12-29 ENCOUNTER — Inpatient Hospital Stay: Payer: Medicare Other | Admitting: Family Medicine

## 2022-12-29 DIAGNOSIS — R531 Weakness: Secondary | ICD-10-CM | POA: Diagnosis not present

## 2022-12-29 DIAGNOSIS — R410 Disorientation, unspecified: Secondary | ICD-10-CM | POA: Diagnosis not present

## 2022-12-29 LAB — CBG MONITORING, ED
Glucose-Capillary: 103 mg/dL — ABNORMAL HIGH (ref 70–99)
Glucose-Capillary: 124 mg/dL — ABNORMAL HIGH (ref 70–99)
Glucose-Capillary: 69 mg/dL — ABNORMAL LOW (ref 70–99)
Glucose-Capillary: 81 mg/dL (ref 70–99)
Glucose-Capillary: 89 mg/dL (ref 70–99)

## 2022-12-29 NOTE — ED Notes (Signed)
Transition of Care [facility]) - Emergency Department Mini Assessment   Patient Details  Name: Terrance Miller MRN: [id number] Date of Birth: 03-08-1928  [facility] CM/SW Contact:    Beather Arbour Phone Number: 12/29/2022, 3:54 PM   Clinical Narrative: Patient was admitted to ER for Altered Mental Status . CSW spoke with daughter about PT evaluation. Daughter was agreeable to SNF placement. Daughter top choice for her dad was [facility]. Referral sent to HUB, patient was accepted, and Auth was started. TOC will continue to follow and update daughter once Berkley Harvey is approved.   ED Mini Assessment: What brought you to the Emergency Department? : Altered Mental Status  Barriers to Discharge: ED SNF auth  Barrier interventions: CSW awaiting for [employer] of departure: Ambulance  Interventions which prevented an admission or readmission: SNF Placement    Patient Contact and Communications Key Contact 1: [name]- daughter   Spoke with: Daughter Contact Date: 12/29/22,   Contact time: 1030 Contact Phone Number: [phone] Call outcome: Agreeable to SNF- [facility] first choice  Patient states their goals for this hospitalization and ongoing recovery are:: DC to SNF CMS Medicare.gov Compare Post Acute Care list provided to:: Patient Represenative (must comment) (Daughter [name]) Choice offered to / list presented to : Adult Children  Admission diagnosis:  Altered Mental Status Patient Active Problem List   Diagnosis Date Noted   Type 2 diabetes mellitus with chronic kidney disease, with long-term current use of insulin (HCC) 12/20/2022   Glaucoma 12/20/2022   Acute renal failure superimposed on stage 3a chronic kidney disease (HCC) 12/20/2022   Acute metabolic encephalopathy 12/19/2022   Vascular dementia without behavioral disturbance (HCC) 08/15/2022   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   Aortic  atherosclerosis (HCC) 08/05/2022   Chronic constipation 08/05/2022   Increased intraocular pressure, bilateral 08/05/2022   Edema, peripheral 08/05/2022   Chronic indwelling Foley catheter 07/29/2022   Aortic regurgitation 03/17/2022   Pulmonary HTN (HCC) 03/17/2022   Dry skin dermatitis 02/05/2022   UTI (urinary tract infection) due to urinary indwelling Foley catheter (HCC) 01/28/2022   Hypokalemia 01/27/2022   Stage 3b chronic kidney disease (HCC) 11/05/2021   MGUS (monoclonal gammopathy of unknown significance) 11/05/2021   Acute kidney injury superimposed on stage 3b chronic kidney disease (HCC) 07/27/2021   Macrocytic anemia 05/31/2021   History of MI (myocardial infarction) 04/29/2021   Hypothermia 05/20/2017   Essential hypertension 09/28/2013   Hyperlipidemia LDL goal <70 09/28/2013   CAD (coronary artery disease) 09/28/2013   PCP:  [name], DO Pharmacy:   [facility] [place], [place] - [address] [place] [address] Phone: [phone] Fax: [phone]  [facility] - [place], [place] - [address] [place] [address] Phone: [phone] Fax: [phone]  [facility] [phone] - [place], [place] - [address] AT [facility] [address] [place] [address] Phone: [phone] Fax: [phone]

## 2022-12-29 NOTE — ED Notes (Addendum)
CSW spoke with daughter this morning regarding SNF options. Daughter was agreeable to sending pt referral out to local SNF's;  [address] , [address], [place], and [place]. Daughter stated that her main choice is [facility]. CSW completed FL2 and sent out referral in HUB to facilities above. TOC will continue to follow.    [facility] accepted referral . CSW awaiting for authorization through pt insurance to move forward . Daughter was notified and agreeable to acceptance.

## 2022-12-29 NOTE — [place] FL2 (Signed)
[place] MEDICAID FL2 LEVEL OF CARE FORM     IDENTIFICATION  Patient Name: Terrance Miller Birthdate: 1928-01-13 Sex: male Admission Date (Current Location): 12/26/2022  [facility] and [place] Number:  [employer] and Address:  [facility]  [address], [place] [address]      Provider Number: [id number]  Attending Physician Name and Address:  System, Provider Not In  Relative Name and Phone Number:  [name] ( sister) [phone]    Current Level of Care: Hospital Recommended Level of Care: Skilled Nursing Facility Prior Approval Number:    Date Approved/Denied:   PASRR Number: [id number]  Discharge Plan: SNF    Current Diagnoses: Patient Active Problem List   Diagnosis Date Noted   Type 2 diabetes mellitus with chronic kidney disease, with long-term current use of insulin (HCC) 12/20/2022   Glaucoma 12/20/2022   Acute renal failure superimposed on stage 3a chronic kidney disease (HCC) 12/20/2022   Acute metabolic encephalopathy 12/19/2022   Vascular dementia without behavioral disturbance (HCC) 08/15/2022   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   Aortic atherosclerosis (HCC) 08/05/2022   Chronic constipation 08/05/2022   Increased intraocular pressure, bilateral 08/05/2022   Edema, peripheral 08/05/2022   Chronic indwelling Foley catheter 07/29/2022   Aortic regurgitation 03/17/2022   Pulmonary HTN (HCC) 03/17/2022   Dry skin dermatitis 02/05/2022   UTI (urinary tract infection) due to urinary indwelling Foley catheter (HCC) 01/28/2022   Hypokalemia 01/27/2022   Stage 3b chronic kidney disease (HCC) 11/05/2021   MGUS (monoclonal gammopathy of unknown significance) 11/05/2021   Acute kidney injury superimposed on stage 3b chronic kidney disease (HCC) 07/27/2021   Macrocytic anemia 05/31/2021   History of MI (myocardial infarction) 04/29/2021   Hypothermia 05/20/2017   Essential hypertension 09/28/2013    Hyperlipidemia LDL goal <70 09/28/2013   CAD (coronary artery disease) 09/28/2013    Orientation RESPIRATION BLADDER Height & Weight     Self, Time, Place, Situation  Normal External catheter Weight:   Height:     BEHAVIORAL SYMPTOMS/MOOD NEUROLOGICAL BOWEL NUTRITION STATUS      Incontinent Diet (See DC summary)  AMBULATORY STATUS COMMUNICATION OF NEEDS Skin   Extensive Assist Verbally Normal                       Personal Care Assistance Level of Assistance  Bathing, Feeding, Dressing Bathing Assistance: Maximum assistance Feeding assistance: Limited assistance Dressing Assistance: Maximum assistance     Functional Limitations Info  Sight, Hearing, Speech Sight Info: Impaired Hearing Info: Impaired (Hearing aids in R and L ear) Speech Info: Adequate    SPECIAL CARE FACTORS FREQUENCY  PT (By licensed PT), OT (By licensed OT)     PT Frequency: 5 x a week OT Frequency: 5 x a week            Contractures Contractures Info: Not present    Additional Factors Info  Code Status, Allergies Code Status Info: FULL Allergies Info: Clobetasol           Current Medications (12/29/2022):  This is the current hospital active medication list Current Facility-Administered Medications  Medication Dose Route Frequency Provider Last Rate Last Admin   acetaminophen (TYLENOL) tablet 650 mg  650 mg Oral Q6H PRN Eber Hong, MD       amLODipine (NORVASC) tablet 2.5 mg  2.5 mg Oral Daily Eber Hong, MD   2.5 mg at 12/29/22 1015   aspirin EC tablet 81 mg  81 mg Oral Q breakfast Eber Hong, MD   81 mg at 12/29/22 0818   bisacodyl (DULCOLAX) suppository 10 mg  10 mg Rectal Q M,W,F Eber Hong, MD   10 mg at 12/29/22 1025   feeding supplement (ENSURE ENLIVE / ENSURE PLUS) liquid 237 mL  237 mL Oral BID BM Eber Hong, MD   237 mL at 12/29/22 1039   latanoprost (XALATAN) 0.005 % ophthalmic solution 1 drop  1 drop Both Eyes BID Eber Hong, MD   1 drop at 12/29/22 1036    polyethylene glycol (MIRALAX / GLYCOLAX) packet 17 g  17 g Oral BID Eber Hong, MD   17 g at 12/29/22 1017   rosuvastatin (CRESTOR) tablet 5 mg  5 mg Oral Daily Eber Hong, MD   5 mg at 12/29/22 1034   tamsulosin (FLOMAX) capsule 0.4 mg  0.4 mg Oral Daily Eber Hong, MD   0.4 mg at 12/29/22 1015   Current Outpatient Medications  Medication Sig Dispense Refill   amLODipine (NORVASC) 2.5 MG tablet Take 1 tablet (2.5 mg total) by mouth daily. 90 tablet 0   aspirin EC 81 MG tablet Take 1 tablet (81 mg total) by mouth daily with breakfast. Swallow whole. 90 tablet 3   bisacodyl (DULCOLAX) 10 MG suppository Place 1 suppository (10 mg total) rectally every Monday, Wednesday, and Friday. (Patient taking differently: Place 10 mg rectally daily as needed for mild constipation.) 15 suppository 3   carvedilol (COREG) 6.25 MG tablet Take 1 tablet (6.25 mg total) by mouth 2 (two) times daily. 60 tablet 0   cefdinir (OMNICEF) 300 MG capsule Take 1 capsule (300 mg total) by mouth every 12 (twelve) hours. 10 capsule 0   cholecalciferol (VITAMIN D) 1000 units tablet Take 1,000 Units by mouth daily.     cyanocobalamin (VITAMIN B12) 1000 MCG tablet Take 1,000 mcg by mouth daily.     folic acid (FOLVITE) 1 MG tablet Take 1 tablet (1 mg total) by mouth daily. 30 tablet 0   furosemide (LASIX) 20 MG tablet Take 1 tablet (20 mg total) by mouth daily. 30 tablet 3   latanoprost (XALATAN) 0.005 % ophthalmic solution Place 1 drop into both eyes 2 (two) times daily. 2.5 mL 0   loperamide (IMODIUM) 2 MG capsule Take 2 mg by mouth as needed for diarrhea or loose stools.     polyethylene glycol (MIRALAX / GLYCOLAX) 17 g packet Take 17 g by mouth 2 (two) times daily. (Patient taking differently: Take 17 g by mouth daily as needed for mild constipation.) 60 each 3   rosuvastatin (CRESTOR) 5 MG tablet Take 5 mg by mouth daily.     tamsulosin (FLOMAX) 0.4 MG CAPS capsule Take 1 capsule (0.4 mg total) by mouth daily. 30  capsule 0   acetaminophen (TYLENOL) 325 MG tablet Take 2 tablets (650 mg total) by mouth every 6 (six) hours as needed for mild pain (or Fever >/= 101).     potassium chloride (KLOR-CON) 10 MEQ tablet Take 1 tablet (10 mEq total) by mouth daily. Take While taking Lasix/furosemide 30 tablet 0     Discharge Medications: Please see AVS for a list of discharge medications.  Relevant Imaging Results:  Relevant Lab Results:   Additional Information SSN: [id number]  Isabella Bowens, [place]

## 2022-12-29 NOTE — ED Notes (Signed)
Patient's daughter "nookie" called to make sure we knew patient liked to watch westerns and to be sure we told him she called for him.

## 2022-12-29 NOTE — NC FL2 (Signed)
[place] MEDICAID FL2 LEVEL OF CARE FORM     IDENTIFICATION  Patient Name: Terrance Miller Birthdate: Dec 28, 1928 Sex: male Admission Date (Current Location): 12/26/2022  [facility] and [place] Number:  [employer] and Address:  [facility],  [address] [place] [address]      Provider Number: [id number]  Attending Physician Name and Address:  System, Provider Not In  Relative Name and Phone Number:  [name] ( sister) [phone]    Current Level of Care: Hospital Recommended Level of Care: Skilled Nursing Facility Prior Approval Number:    Date Approved/Denied:   PASRR Number: [id number]  Discharge Plan: SNF    Current Diagnoses: Patient Active Problem List   Diagnosis Date Noted   Type 2 diabetes mellitus with chronic kidney disease, with long-term current use of insulin (HCC) 12/20/2022   Glaucoma 12/20/2022   Acute renal failure superimposed on stage 3a chronic kidney disease (HCC) 12/20/2022   Acute metabolic encephalopathy 12/19/2022   Vascular dementia without behavioral disturbance (HCC) 08/15/2022   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   Aortic atherosclerosis (HCC) 08/05/2022   Chronic constipation 08/05/2022   Increased intraocular pressure, bilateral 08/05/2022   Edema, peripheral 08/05/2022   Chronic indwelling Foley catheter 07/29/2022   Aortic regurgitation 03/17/2022   Pulmonary HTN (HCC) 03/17/2022   Dry skin dermatitis 02/05/2022   UTI (urinary tract infection) due to urinary indwelling Foley catheter (HCC) 01/28/2022   Hypokalemia 01/27/2022   Stage 3b chronic kidney disease (HCC) 11/05/2021   MGUS (monoclonal gammopathy of unknown significance) 11/05/2021   Acute kidney injury superimposed on stage 3b chronic kidney disease (HCC) 07/27/2021   Macrocytic anemia 05/31/2021   History of MI (myocardial infarction) 04/29/2021   Hypothermia 05/20/2017   Essential hypertension 09/28/2013    Hyperlipidemia LDL goal <70 09/28/2013   CAD (coronary artery disease) 09/28/2013    Orientation RESPIRATION BLADDER Height & Weight     Self, Time, Place, Situation  Normal External catheter Weight:   Height:     BEHAVIORAL SYMPTOMS/MOOD NEUROLOGICAL BOWEL NUTRITION STATUS      Incontinent Diet (See DC summary)  AMBULATORY STATUS COMMUNICATION OF NEEDS Skin   Extensive Assist Verbally Normal                       Personal Care Assistance Level of Assistance  Bathing, Feeding, Dressing Bathing Assistance: Maximum assistance Feeding assistance: Limited assistance Dressing Assistance: Maximum assistance     Functional Limitations Info  Sight, Hearing, Speech Sight Info: Impaired Hearing Info: Impaired (Hearing aids in R and L ear) Speech Info: Adequate    SPECIAL CARE FACTORS FREQUENCY  PT (By licensed PT), OT (By licensed OT)     PT Frequency: 5 x a week OT Frequency: 5 x a week            Contractures Contractures Info: Not present    Additional Factors Info  Code Status, Allergies Code Status Info: FULL Allergies Info: Clobetasol           Current Medications (12/29/2022):  This is the current hospital active medication list Current Facility-Administered Medications  Medication Dose Route Frequency Provider Last Rate Last Admin   acetaminophen (TYLENOL) tablet 650 mg  650 mg Oral Q6H PRN Eber Hong, MD       amLODipine (NORVASC) tablet 2.5 mg  2.5 mg Oral Daily Eber Hong, MD   2.5 mg at 12/28/22 2130   aspirin EC tablet 81 mg  81 mg Oral Q breakfast Eber Hong, MD   81 mg at 12/29/22 0818   bisacodyl (DULCOLAX) suppository 10 mg  10 mg Rectal Q M,W,F Eber Hong, MD       feeding supplement (ENSURE ENLIVE / ENSURE PLUS) liquid 237 mL  237 mL Oral BID BM Eber Hong, MD       latanoprost (XALATAN) 0.005 % ophthalmic solution 1 drop  1 drop Both Eyes BID Eber Hong, MD   1 drop at 12/28/22 2130   polyethylene glycol (MIRALAX / GLYCOLAX)  packet 17 g  17 g Oral BID Eber Hong, MD   17 g at 12/28/22 2130   rosuvastatin (CRESTOR) tablet 5 mg  5 mg Oral Daily Eber Hong, MD   5 mg at 12/28/22 2131   tamsulosin (FLOMAX) capsule 0.4 mg  0.4 mg Oral Daily Eber Hong, MD   0.4 mg at 12/28/22 2130   Current Outpatient Medications  Medication Sig Dispense Refill   amLODipine (NORVASC) 2.5 MG tablet Take 1 tablet (2.5 mg total) by mouth daily. 90 tablet 0   aspirin EC 81 MG tablet Take 1 tablet (81 mg total) by mouth daily with breakfast. Swallow whole. 90 tablet 3   bisacodyl (DULCOLAX) 10 MG suppository Place 1 suppository (10 mg total) rectally every Monday, Wednesday, and Friday. (Patient taking differently: Place 10 mg rectally daily as needed for mild constipation.) 15 suppository 3   carvedilol (COREG) 6.25 MG tablet Take 1 tablet (6.25 mg total) by mouth 2 (two) times daily. 60 tablet 0   cefdinir (OMNICEF) 300 MG capsule Take 1 capsule (300 mg total) by mouth every 12 (twelve) hours. 10 capsule 0   cholecalciferol (VITAMIN D) 1000 units tablet Take 1,000 Units by mouth daily.     cyanocobalamin (VITAMIN B12) 1000 MCG tablet Take 1,000 mcg by mouth daily.     folic acid (FOLVITE) 1 MG tablet Take 1 tablet (1 mg total) by mouth daily. 30 tablet 0   furosemide (LASIX) 20 MG tablet Take 1 tablet (20 mg total) by mouth daily. 30 tablet 3   latanoprost (XALATAN) 0.005 % ophthalmic solution Place 1 drop into both eyes 2 (two) times daily. 2.5 mL 0   loperamide (IMODIUM) 2 MG capsule Take 2 mg by mouth as needed for diarrhea or loose stools.     polyethylene glycol (MIRALAX / GLYCOLAX) 17 g packet Take 17 g by mouth 2 (two) times daily. (Patient taking differently: Take 17 g by mouth daily as needed for mild constipation.) 60 each 3   rosuvastatin (CRESTOR) 5 MG tablet Take 5 mg by mouth daily.     tamsulosin (FLOMAX) 0.4 MG CAPS capsule Take 1 capsule (0.4 mg total) by mouth daily. 30 capsule 0   acetaminophen (TYLENOL) 325 MG  tablet Take 2 tablets (650 mg total) by mouth every 6 (six) hours as needed for mild pain (or Fever >/= 101).     potassium chloride (KLOR-CON) 10 MEQ tablet Take 1 tablet (10 mEq total) by mouth daily. Take While taking Lasix/furosemide 30 tablet 0     Discharge Medications: Please see discharge summary for a list of discharge medications.  Relevant Imaging Results:  Relevant Lab Results:   Additional Information SSN: [id number]  Isabella Bowens, [place]

## 2022-12-29 NOTE — ED Notes (Signed)
Pt has been changed and cleaned up. Pt is resting in the bed at this time.

## 2022-12-29 NOTE — ED Notes (Signed)
Pt received dinner tray. Pt ate four bites of food and drank a couple sips of water then said he wasn't hungry anymore.

## 2022-12-29 NOTE — ED Notes (Signed)
Nurse notified and pt given the rest of ensure and orange juice to bring up CBG

## 2022-12-29 NOTE — ED Notes (Signed)
Patient given a half a bottle of ensure and had a cup of orange juice.

## 2022-12-29 NOTE — ED Provider Notes (Signed)
Emergency Medicine Observation Re-evaluation Note  Terrance Miller is a 89 y.o. male, seen on rounds today.  Pt initially presented to the ED for complaints of Altered Mental Status Currently, the patient is sleeping, resting comfortably.  Physical Exam  BP (!) 169/70   Pulse (!) 42   Temp 97.8 F (36.6 C) (Axillary)   Resp 14   SpO2 94%  Physical Exam General: No distress Psych: Calm when awake.  ED Course / MDM  EKG:EKG Interpretation Date/Time:  Friday December 26 2022 19:41:04 EST Ventricular Rate:  56 PR Interval:  66 QRS Duration:  96 QT Interval:  483 QTC Calculation: 467 R Axis:   60  Text Interpretation: Normal sinus rhythm Short PR interval Nonspecific repol abnrm, inferolateral lds 12 Lead; Mason-Likar Confirmed by Estelle June [phone]) on 12/27/2022 2:32:21 AM  I have reviewed the labs performed to date as well as medications administered while in observation.  Recent changes in the last 24 hours include visitor yesterday.  Plan  Current plan is for placement.    Gerhard Munch, MD 12/29/22 [phone]

## 2022-12-30 ENCOUNTER — Emergency Department (HOSPITAL_COMMUNITY): Payer: Medicare Other

## 2022-12-30 DIAGNOSIS — R531 Weakness: Secondary | ICD-10-CM | POA: Diagnosis not present

## 2022-12-30 DIAGNOSIS — R410 Disorientation, unspecified: Secondary | ICD-10-CM | POA: Diagnosis not present

## 2022-12-30 DIAGNOSIS — R918 Other nonspecific abnormal finding of lung field: Secondary | ICD-10-CM | POA: Diagnosis not present

## 2022-12-30 DIAGNOSIS — R0989 Other specified symptoms and signs involving the circulatory and respiratory systems: Secondary | ICD-10-CM | POA: Diagnosis not present

## 2022-12-30 DIAGNOSIS — I700 Atherosclerosis of aorta: Secondary | ICD-10-CM | POA: Diagnosis not present

## 2022-12-30 LAB — CBG MONITORING, ED
Glucose-Capillary: 100 mg/dL — ABNORMAL HIGH (ref 70–99)
Glucose-Capillary: 105 mg/dL — ABNORMAL HIGH (ref 70–99)

## 2022-12-30 NOTE — ED Notes (Signed)
Patient insurance requested for more records today before approving authorization for SNF placement. TOC will continue to follow.

## 2022-12-30 NOTE — ED Notes (Signed)
Pt soiled, cleaned and changed with NT assistance. Pt repositioned on side and adjusted in bed. Comfortable at this time, provided with another blanket

## 2022-12-30 NOTE — Progress Notes (Signed)
Physical Therapy Treatment Patient Details Name: Terrance Miller MRN: [id number] DOB: [birth year] Today's Date: 12/30/2022   History of Present Illness Terrance Miller is a 89 y.o. male.  With history of CAD, MI, CKD, MGUS, chronic indwelling Foley type 2 diabetes who presents to the ED for generalized weakness.  Recently admitted for confusion and hallucinations and generalized weakness suspected to be due to UTI.  Was admitted on the 13th and discharged on the 15th.  Has been progressively weak and confused at home since the time of his discharge.  Discharged with cefdinir x 5 days.   PT Comments  PT was Mod I with walker at home prior to this bout of illness.  Originally at eval pt was resistant to therapy.  Today pt is a willing participant.  He is able to sit at bedside with min assist although this takes significant increased time and labor.  Attempts to stand but needs max assist and O2 drops.  PT will need continued skilled PT/OT to progress pt to his prior level of functioning.     If plan is discharge home, recommend the following: A lot of help with walking and/or transfers;A lot of help with bathing/dressing/bathroom;Supervision due to cognitive status;Help with stairs or ramp for entrance;Assistance with cooking/housework;Assistance with feeding;Direct supervision/assist for financial management;Direct supervision/assist for medications management;Assist for transportation   Can travel by private vehicle     No  Equipment Recommendations  None recommended by PT    Recommendations for Other Services  OT     Precautions / Restrictions Precautions Precautions: Fall Restrictions Weight Bearing Restrictions Per Provider Order: No     Mobility  Bed Mobility Overal bed mobility: Needs Assistance Bed Mobility: Supine to Sit      Supine to sit: Min assist Sit to supine: Min assist   General bed mobility comments: PT cooperative with treatment.  Once sitting O2 decreases to 84, able  to verbally cue to take deep breaths to increase O2 to 92.    Transfers Overall transfer level: Needs assistance                 General transfer comment: max assist                Balance Overall balance assessment: Needs assistance Sitting-balance support: Feet unsupported, No upper extremity supported Sitting balance-Leahy Scale: Fair Sitting balance - Comments: At EOB                                 Cognition Arousal: Alert  Behavior During Therapy:  cooperative                                   General Comments: alert and agreeable to therapy               Pertinent Vitals/Pain Pain Assessment Pain Assessment: No/denies pain           PT Goals (current goals can now be found in the care plan section) Acute Rehab PT Goals Patient Stated Goal: To get stronger Time For Goal Achievement: 01/09/23    Frequency    Min 3X/week      PT Plan  There ex/ gt training        AM-PAC PT "6 Clicks" Mobility   Outcome Measure  Help needed turning from your back to your side while in  a flat bed without using bedrails?: A Lot Help needed moving from lying on your back to sitting on the side of a flat bed without using bedrails?: A Lot Help needed moving to and from a bed to a chair (including a wheelchair)?: A Lot Help needed standing up from a chair using your arms (e.g., wheelchair or bedside chair)?: A Lot Help needed to walk in hospital room?: A Lot Help needed climbing 3-5 steps with a railing? : A Lot 6 Click Score: 12    End of Session   Activity Tolerance: Other (comment) (limited by O2 levels) Patient left: in bed   PT Visit Diagnosis: Other (comment);Muscle weakness (generalized) (M62.81);Difficulty in walking, not elsewhere classified (R26.2)     Time: 1015-1030 PT Time Calculation (min) (ACUTE ONLY): 15 min  Charges:    $Therapeutic Activity: 8-22 mins PT General Charges $$ ACUTE PT VISIT: 1  Visit                      Virgina Organ, PT CLT [phone]  12/30/2022, 10:31 AM

## 2022-12-30 NOTE — ED Provider Notes (Signed)
Emergency Medicine Observation Re-evaluation Note  Terrance Miller is a 89 y.o. male, seen on rounds today.  Pt initially presented to the ED for complaints of Altered Mental Status Currently, the patient is resting and awaiting placement.  Physical Exam  BP (!) 141/66   Pulse (!) 57   Temp 98.5 F (36.9 C) (Oral)   Resp 17   SpO2 95%  Physical Exam Alert and in no acute distress ED Course / MDM  EKG:EKG Interpretation Date/Time:  Friday December 26 2022 19:41:04 EST Ventricular Rate:  56 PR Interval:  66 QRS Duration:  96 QT Interval:  483 QTC Calculation: 467 R Axis:   60  Text Interpretation: Normal sinus rhythm Short PR interval Nonspecific repol abnrm, inferolateral lds 12 Lead; Mason-Likar Confirmed by Estelle June [phone]) on 12/27/2022 2:32:21 AM  I have reviewed the labs performed to date as well as medications administered while in observation.  Recent changes in the last 24 hours include none.  Plan  Current plan is for placement.    Bethann Berkshire, MD 12/30/22 [phone]

## 2022-12-31 ENCOUNTER — Emergency Department (HOSPITAL_COMMUNITY): Payer: Medicare Other

## 2022-12-31 DIAGNOSIS — N1832 Chronic kidney disease, stage 3b: Secondary | ICD-10-CM | POA: Diagnosis not present

## 2022-12-31 DIAGNOSIS — R410 Disorientation, unspecified: Secondary | ICD-10-CM | POA: Diagnosis not present

## 2022-12-31 DIAGNOSIS — R29898 Other symptoms and signs involving the musculoskeletal system: Secondary | ICD-10-CM | POA: Diagnosis not present

## 2022-12-31 DIAGNOSIS — J129 Viral pneumonia, unspecified: Secondary | ICD-10-CM | POA: Diagnosis present

## 2022-12-31 DIAGNOSIS — N179 Acute kidney failure, unspecified: Secondary | ICD-10-CM | POA: Diagnosis not present

## 2022-12-31 DIAGNOSIS — R918 Other nonspecific abnormal finding of lung field: Secondary | ICD-10-CM | POA: Diagnosis not present

## 2022-12-31 DIAGNOSIS — D631 Anemia in chronic kidney disease: Secondary | ICD-10-CM | POA: Diagnosis not present

## 2022-12-31 DIAGNOSIS — T502X5A Adverse effect of carbonic-anhydrase inhibitors, benzothiadiazides and other diuretics, initial encounter: Secondary | ICD-10-CM | POA: Diagnosis not present

## 2022-12-31 DIAGNOSIS — D61818 Other pancytopenia: Secondary | ICD-10-CM | POA: Diagnosis not present

## 2022-12-31 DIAGNOSIS — J101 Influenza due to other identified influenza virus with other respiratory manifestations: Secondary | ICD-10-CM | POA: Diagnosis present

## 2022-12-31 DIAGNOSIS — R531 Weakness: Secondary | ICD-10-CM | POA: Diagnosis not present

## 2022-12-31 DIAGNOSIS — R140 Abdominal distension (gaseous): Secondary | ICD-10-CM | POA: Diagnosis not present

## 2022-12-31 DIAGNOSIS — Z7401 Bed confinement status: Secondary | ICD-10-CM | POA: Diagnosis not present

## 2022-12-31 DIAGNOSIS — I509 Heart failure, unspecified: Secondary | ICD-10-CM | POA: Diagnosis not present

## 2022-12-31 DIAGNOSIS — F0390 Unspecified dementia without behavioral disturbance: Secondary | ICD-10-CM | POA: Diagnosis present

## 2022-12-31 DIAGNOSIS — I252 Old myocardial infarction: Secondary | ICD-10-CM | POA: Diagnosis not present

## 2022-12-31 DIAGNOSIS — E876 Hypokalemia: Secondary | ICD-10-CM | POA: Diagnosis not present

## 2022-12-31 DIAGNOSIS — R0600 Dyspnea, unspecified: Secondary | ICD-10-CM | POA: Diagnosis not present

## 2022-12-31 DIAGNOSIS — Z6835 Body mass index (BMI) 35.0-35.9, adult: Secondary | ICD-10-CM | POA: Diagnosis not present

## 2022-12-31 DIAGNOSIS — M6281 Muscle weakness (generalized): Secondary | ICD-10-CM | POA: Diagnosis not present

## 2022-12-31 DIAGNOSIS — Z8249 Family history of ischemic heart disease and other diseases of the circulatory system: Secondary | ICD-10-CM | POA: Diagnosis not present

## 2022-12-31 DIAGNOSIS — I2510 Atherosclerotic heart disease of native coronary artery without angina pectoris: Secondary | ICD-10-CM | POA: Diagnosis not present

## 2022-12-31 DIAGNOSIS — R059 Cough, unspecified: Secondary | ICD-10-CM | POA: Diagnosis not present

## 2022-12-31 DIAGNOSIS — R278 Other lack of coordination: Secondary | ICD-10-CM | POA: Diagnosis not present

## 2022-12-31 DIAGNOSIS — Z1152 Encounter for screening for COVID-19: Secondary | ICD-10-CM | POA: Diagnosis not present

## 2022-12-31 DIAGNOSIS — R1312 Dysphagia, oropharyngeal phase: Secondary | ICD-10-CM | POA: Diagnosis not present

## 2022-12-31 DIAGNOSIS — E1122 Type 2 diabetes mellitus with diabetic chronic kidney disease: Secondary | ICD-10-CM | POA: Diagnosis not present

## 2022-12-31 DIAGNOSIS — Z515 Encounter for palliative care: Secondary | ICD-10-CM | POA: Diagnosis not present

## 2022-12-31 DIAGNOSIS — I5033 Acute on chronic diastolic (congestive) heart failure: Secondary | ICD-10-CM | POA: Diagnosis not present

## 2022-12-31 DIAGNOSIS — J1001 Influenza due to other identified influenza virus with the same other identified influenza virus pneumonia: Secondary | ICD-10-CM | POA: Diagnosis present

## 2022-12-31 DIAGNOSIS — Z66 Do not resuscitate: Secondary | ICD-10-CM | POA: Diagnosis not present

## 2022-12-31 DIAGNOSIS — E785 Hyperlipidemia, unspecified: Secondary | ICD-10-CM | POA: Diagnosis not present

## 2022-12-31 DIAGNOSIS — L89892 Pressure ulcer of other site, stage 2: Secondary | ICD-10-CM | POA: Diagnosis not present

## 2022-12-31 DIAGNOSIS — N5089 Other specified disorders of the male genital organs: Secondary | ICD-10-CM | POA: Diagnosis present

## 2022-12-31 DIAGNOSIS — I130 Hypertensive heart and chronic kidney disease with heart failure and stage 1 through stage 4 chronic kidney disease, or unspecified chronic kidney disease: Secondary | ICD-10-CM | POA: Diagnosis not present

## 2022-12-31 DIAGNOSIS — I69891 Dysphagia following other cerebrovascular disease: Secondary | ICD-10-CM | POA: Diagnosis not present

## 2022-12-31 DIAGNOSIS — Z7189 Other specified counseling: Secondary | ICD-10-CM | POA: Diagnosis not present

## 2022-12-31 DIAGNOSIS — J9601 Acute respiratory failure with hypoxia: Secondary | ICD-10-CM | POA: Diagnosis not present

## 2022-12-31 DIAGNOSIS — R0602 Shortness of breath: Secondary | ICD-10-CM | POA: Diagnosis not present

## 2022-12-31 DIAGNOSIS — Z87891 Personal history of nicotine dependence: Secondary | ICD-10-CM | POA: Diagnosis not present

## 2022-12-31 LAB — CBC WITH DIFFERENTIAL/PLATELET
Abs Immature Granulocytes: 0.02 10*3/uL (ref 0.00–0.07)
Basophils Absolute: 0 10*3/uL (ref 0.0–0.1)
Basophils Relative: 1 %
Eosinophils Absolute: 0 10*3/uL (ref 0.0–0.5)
Eosinophils Relative: 0 %
HCT: 26.8 % — ABNORMAL LOW (ref 39.0–52.0)
Hemoglobin: 8.4 g/dL — ABNORMAL LOW (ref 13.0–17.0)
Immature Granulocytes: 1 %
Lymphocytes Relative: 19 %
Lymphs Abs: 0.8 10*3/uL (ref 0.7–4.0)
MCH: 32.6 pg (ref 26.0–34.0)
MCHC: 31.3 g/dL (ref 30.0–36.0)
MCV: 103.9 fL — ABNORMAL HIGH (ref 80.0–100.0)
Monocytes Absolute: 0.6 10*3/uL (ref 0.1–1.0)
Monocytes Relative: 14 %
Neutro Abs: 2.5 10*3/uL (ref 1.7–7.7)
Neutrophils Relative %: 65 %
Platelets: 72 10*3/uL — ABNORMAL LOW (ref 150–400)
RBC: 2.58 MIL/uL — ABNORMAL LOW (ref 4.22–5.81)
RDW: 28.5 % — ABNORMAL HIGH (ref 11.5–15.5)
WBC: 3.9 10*3/uL — ABNORMAL LOW (ref 4.0–10.5)
nRBC: 1.3 % — ABNORMAL HIGH (ref 0.0–0.2)

## 2022-12-31 LAB — RESP PANEL BY RT-PCR (RSV, FLU A&B, COVID)  RVPGX2
Influenza A by PCR: POSITIVE — AB
Influenza B by PCR: NEGATIVE
Resp Syncytial Virus by PCR: NEGATIVE
SARS Coronavirus 2 by RT PCR: NEGATIVE

## 2022-12-31 LAB — BASIC METABOLIC PANEL
Anion gap: 10 (ref 5–15)
BUN: 34 mg/dL — ABNORMAL HIGH (ref 8–23)
CO2: 21 mmol/L — ABNORMAL LOW (ref 22–32)
Calcium: 9 mg/dL (ref 8.9–10.3)
Chloride: 111 mmol/L (ref 98–111)
Creatinine, Ser: 2.06 mg/dL — ABNORMAL HIGH (ref 0.61–1.24)
GFR, Estimated: 30 mL/min — ABNORMAL LOW (ref >60)
Glucose, Bld: 124 mg/dL — ABNORMAL HIGH (ref 70–99)
Potassium: 3.5 mmol/L (ref 3.5–5.1)
Sodium: 142 mmol/L (ref 135–145)

## 2022-12-31 LAB — BRAIN NATRIURETIC PEPTIDE: B Natriuretic Peptide: 1097 pg/mL — ABNORMAL HIGH (ref 0.0–100.0)

## 2022-12-31 MED ORDER — FUROSEMIDE 10 MG/ML IJ SOLN
40.0000 mg | Freq: Once | INTRAMUSCULAR | Status: AC
  Administered 2023-01-01: 40 mg via INTRAVENOUS
  Filled 2022-12-31: qty 4

## 2022-12-31 MED ORDER — POLYETHYLENE GLYCOL 3350 17 G PO PACK: 17.0000 g | PACK | Freq: Two times a day (BID) | ORAL | Status: DC | PRN

## 2022-12-31 MED ORDER — POLYETHYLENE GLYCOL 3350 17 G PO PACK
17.0000 g | PACK | Freq: Two times a day (BID) | ORAL | Status: DC
  Administered 2023-01-01 – 2023-01-05 (×7): 17 g via ORAL
  Filled 2022-12-31 (×8): qty 1

## 2022-12-31 MED ORDER — SENNA 8.6 MG PO TABS
2.0000 | ORAL_TABLET | Freq: Every day | ORAL | Status: DC
  Administered 2023-01-01 – 2023-01-04 (×4): 17.2 mg via ORAL
  Filled 2022-12-31 (×4): qty 2

## 2022-12-31 MED ORDER — BISACODYL 10 MG RE SUPP: 10.0000 mg | Freq: Every day | RECTAL | Status: DC | PRN

## 2022-12-31 MED ORDER — ACETAMINOPHEN 500 MG PO TABS
1000.0000 mg | ORAL_TABLET | Freq: Four times a day (QID) | ORAL | Status: DC | PRN
  Administered 2023-01-03 – 2023-01-05 (×3): 1000 mg via ORAL
  Filled 2022-12-31 (×2): qty 2

## 2022-12-31 MED ORDER — HEPARIN SODIUM (PORCINE) 5000 UNIT/ML IJ SOLN
5000.0000 [IU] | Freq: Three times a day (TID) | INTRAMUSCULAR | Status: DC
  Administered 2023-01-01 – 2023-01-05 (×13): 5000 [IU] via SUBCUTANEOUS
  Filled 2022-12-31 (×13): qty 1

## 2022-12-31 MED ORDER — POLYETHYLENE GLYCOL 3350 17 G PO PACK
17.0000 g | PACK | Freq: Every day | ORAL | Status: DC | PRN
Start: 2022-12-31 — End: 2022-12-31

## 2022-12-31 MED ORDER — SODIUM CHLORIDE 0.9% FLUSH
3.0000 mL | Freq: Two times a day (BID) | INTRAVENOUS | Status: DC
  Administered 2023-01-01 – 2023-01-05 (×10): 3 mL via INTRAVENOUS

## 2022-12-31 MED ORDER — OSELTAMIVIR PHOSPHATE 30 MG PO CAPS
30.0000 mg | ORAL_CAPSULE | Freq: Once | ORAL | Status: AC
  Administered 2023-01-01: 30 mg via ORAL
  Filled 2022-12-31: qty 1

## 2022-12-31 MED ORDER — FUROSEMIDE 10 MG/ML IJ SOLN
40.0000 mg | Freq: Two times a day (BID) | INTRAMUSCULAR | Status: DC
  Administered 2023-01-01 – 2023-01-02 (×4): 40 mg via INTRAVENOUS
  Filled 2022-12-31 (×4): qty 4

## 2022-12-31 MED ORDER — ALBUTEROL SULFATE (2.5 MG/3ML) 0.083% IN NEBU
2.5000 mg | INHALATION_SOLUTION | RESPIRATORY_TRACT | Status: DC | PRN
  Administered 2023-01-04: 2.5 mg via RESPIRATORY_TRACT
  Filled 2022-12-31: qty 3

## 2022-12-31 MED ORDER — OSELTAMIVIR PHOSPHATE 30 MG PO CAPS
30.0000 mg | ORAL_CAPSULE | Freq: Every day | ORAL | Status: AC
  Administered 2023-01-01 – 2023-01-04 (×4): 30 mg via ORAL
  Filled 2022-12-31 (×4): qty 1

## 2022-12-31 NOTE — ED Provider Notes (Signed)
Emergency Medicine Observation Re-evaluation Note  Hridhaan Keir is a 89 y.o. male, seen on rounds today.  Pt initially presented to the ED for complaints of Altered Mental Status Currently, the patient is nursing home placement.  Physical Exam  BP 124/65   Pulse 61   Temp 98.4 F (36.9 C) (Oral)   Resp 18   SpO2 93%  Physical Exam Alert and in no acute distress  ED Course / MDM  EKG:EKG Interpretation Date/Time:  Friday December 26 2022 19:41:04 EST Ventricular Rate:  56 PR Interval:  66 QRS Duration:  96 QT Interval:  483 QTC Calculation: 467 R Axis:   60  Text Interpretation: Normal sinus rhythm Short PR interval Nonspecific repol abnrm, inferolateral lds 12 Lead; Mason-Likar Confirmed by Estelle June [phone]) on 12/27/2022 2:32:21 AM  I have reviewed the labs performed to date as well as medications administered while in observation.  Recent changes in the last 24 hours include none.  Plan  Current plan is for nursing home placement.    Bethann Berkshire, MD 12/31/22 [phone]

## 2022-12-31 NOTE — H&P (Incomplete)
History and Physical    Terrance Miller BMW:[id number] DOB: [birth year] DOA: 12/26/2022  PCP: Terrance Sams, DO   Patient coming from: Home   Chief Complaint:  Chief Complaint  Patient presents with  . Altered Mental Status    HPI:  Terrance Miller is a 89 y.o. 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 Has been in ED since this time awaiting SNF placement, currently awaiting auth. Today noted to have borderline O2 sat, worsened resp status / breath sounds and evaluation revealed influenza, heart failure, AKI and called for admission. On interview he is confused and not able to provide significant information. He reports having a cough although not short of breath. Has scrotal swelling. Otherwise no other complaints.    Review of Systems:  ROS complete and negative except as marked above   Allergies  Allergen Reactions  . Clobetasol     Prior to Admission medications   Medication Sig Start Date End Date Taking? Authorizing Provider  acetaminophen (TYLENOL) 325 MG tablet Take 2 tablets (650 mg total) by mouth every 6 (six) hours as needed for mild pain (or Fever >/= 101). 08/02/22  Yes Emokpae, Courage, MD  amLODipine (NORVASC) 2.5 MG tablet Take 1 tablet (2.5 mg total) by mouth daily. 11/25/22  Yes Terrance Sams, DO  aspirin EC 81 MG tablet Take 1 tablet (81 mg total) by mouth daily with breakfast. Swallow whole. 08/02/22  Yes Shon Hale, MD  bisacodyl (DULCOLAX) 10 MG suppository Place 1 suppository (10 mg total) rectally every Monday, Wednesday, and Friday. Patient taking differently: Place 10 mg rectally daily as needed for mild constipation. 01/31/22  Yes Emokpae, Courage, MD  carvedilol (COREG) 6.25 MG tablet Take 1 tablet (6.25 mg total) by mouth 2 (two) times  daily. 08/20/22  Yes Sharee Holster, NP  cholecalciferol (VITAMIN D) 1000 units tablet Take 1,000 Units by mouth daily.   Yes [provider]  cyanocobalamin (VITAMIN B12) 1000 MCG tablet Take 1,000 mcg by mouth daily.   Yes [provider]  folic acid (FOLVITE) 1 MG tablet Take 1 tablet (1 mg total) by mouth daily. 08/20/22  Yes Sharee Holster, NP  furosemide (LASIX) 20 MG tablet Take 1 tablet (20 mg total) by mouth daily. 08/25/22 08/25/23 Yes Cook, [name], DO  latanoprost (XALATAN) 0.005 % ophthalmic solution Place 1 drop into both eyes 2 (two) times daily. 08/20/22  Yes Sharee Holster, NP  loperamide (IMODIUM) 2 MG capsule Take 2 mg by mouth as needed for diarrhea or loose stools.   Yes [provider]  polyethylene glycol (MIRALAX / GLYCOLAX) 17 g packet Take 17 g by mouth 2 (two) times daily. Patient taking differently: Take 17 g by mouth daily as needed for mild constipation. 01/31/22  Yes Emokpae, Courage, MD  potassium chloride (KLOR-CON) 10 MEQ tablet Take 1 tablet (10 mEq total) by mouth daily. Take While taking Lasix/furosemide 08/20/22  Yes Sharee Holster, NP  rosuvastatin (CRESTOR) 5 MG tablet Take 5 mg by mouth daily.   Yes [provider]  tamsulosin (FLOMAX) 0.4 MG CAPS capsule Take 1 capsule (0.4 mg total) by mouth daily. 08/20/22  Yes Sharee Holster, NP  cefdinir (OMNICEF) 300 MG capsule Take 1 capsule (300 mg total) by mouth every 12 (twelve) hours. Patient  not taking: Reported on 12/30/2022 12/22/22   Catarina Hartshorn, MD    Past Medical History:  Diagnosis Date  . Acute ST elevation myocardial infarction (STEMI) of inferior wall (HCC) 2004  . Coronary artery disease    DES x2 to the RCA 2004, residual disease managed medically  . Hyperlipidemia   . Hypertension   . Type 2 diabetes mellitus (HCC)     Past Surgical History:  Procedure Laterality Date  . BIOPSY  01/28/2022   Procedure: BIOPSY;  Surgeon: Marguerita Merles, Reuel Boom, MD;   Location: [facility];  Service: Gastroenterology;;  . CATARACT EXTRACTION Bilateral   . CORONARY ANGIOPLASTY WITH STENT PLACEMENT  09/05/2002   stent RCA, 80% first diagonal, 70-80% mid-diagonal, 80% mid LAD stenosis  . FLEXIBLE SIGMOIDOSCOPY N/A 01/28/2022   Procedure: FLEXIBLE SIGMOIDOSCOPY;  Surgeon: Dolores Frame, MD;  Location: [facility];  Service: Gastroenterology;  Laterality: N/A;  . HYDROCELE EXCISION Bilateral 11/20/2021   Procedure: HYDROCELECTOMY ADULT;  Surgeon: Milderd Meager., MD;  Location: [facility];  Service: Urology;  Laterality: Bilateral;  . IMPACTION REMOVAL  01/28/2022   Procedure: IMPACTION REMOVAL;  Surgeon: Marguerita Merles, Reuel Boom, MD;  Location: [facility];  Service: Gastroenterology;;  . NM MYOCAR PERF WALL MOTION  11/01/2008   Normal     reports that he quit smoking about 17 years ago. His smoking use included cigarettes. He has never used smokeless tobacco. He reports that he does not currently use alcohol. He reports that he does not use drugs.  Family History  Problem Relation Age of Onset  . Hypertension Father   . Diabetes Sister      Physical Exam: Vitals:   12/31/22 2010 12/31/22 2140 12/31/22 2145 12/31/22 2150  BP:      Pulse:  (!) 55 (!) 59 (!) 56  Resp:  (!) 23 (!) 22 (!) 21  Temp: 98.3 F (36.8 C)     TempSrc: Oral     SpO2:  91% 90% 91%    Gen: Awake, alert, chronically ill appearing  CV: Regular, normal S1, S2, no murmurs  Resp: Normal WOB, diffuse rales. Abd: Flat, hypoactive, hyperresonant, nontender MSK: Symmetric, 3+ pitting edema, woody skin changes,  Skin: See MSK  Neuro: Alert and interactive, oriented to self, hospital (not [place]), not time or situation.  Psych: euthymic, confused    Data review:   Labs reviewed, notable for:   Creatinine 2, up from baseline 1.5 BNP 1097 WBC 3.9, hemoglobin 8.4 macrocytic, platelets 72 Positive influenza A  Micro:  Results for orders placed or  performed during the hospital encounter of 12/26/22  Resp panel by RT-PCR (RSV, Flu A&B, Covid) Anterior Nasal Swab     Status: None   Collection Time: 12/26/22  7:17 PM   Specimen: Anterior Nasal Swab  Result Value Ref Range Status   SARS Coronavirus 2 by RT PCR NEGATIVE NEGATIVE Final    Comment: (NOTE) SARS-CoV-2 target nucleic acids are NOT DETECTED.  The SARS-CoV-2 RNA is generally detectable in upper respiratory specimens during the acute phase of infection. The lowest concentration of SARS-CoV-2 viral copies this assay can detect is 138 copies/mL. A negative result does not preclude SARS-Cov-2 infection and should not be used as the sole basis for treatment or other patient management decisions. A negative result may occur with  improper specimen collection/handling, submission of specimen other than nasopharyngeal swab, presence of viral mutation(s) within the areas targeted by this assay, and inadequate number of viral copies(<138  copies/mL). A negative result must be combined with clinical observations, patient history, and epidemiological information. The expected result is Negative.  Fact Sheet for Patients:  BloggerCourse.com  Fact Sheet for Healthcare Providers:  SeriousBroker.it  This test is no t yet approved or cleared by the [place] FDA and  has been authorized for detection and/or diagnosis of SARS-CoV-2 by FDA under an Emergency Use Authorization (EUA). This EUA will remain  in effect (meaning this test can be used) for the duration of the COVID-19 declaration under Section 564(b)(1) of the Act, 21 U.S.C.section 360bbb-3(b)(1), unless the authorization is terminated  or revoked sooner.       Influenza A by PCR NEGATIVE NEGATIVE Final   Influenza B by PCR NEGATIVE NEGATIVE Final    Comment: (NOTE) The Xpert Xpress SARS-CoV-2/FLU/RSV plus assay is intended as an aid in the diagnosis of influenza from  Nasopharyngeal swab specimens and should not be used as a sole basis for treatment. Nasal washings and aspirates are unacceptable for Xpert Xpress SARS-CoV-2/FLU/RSV testing.  Fact Sheet for Patients: BloggerCourse.com  Fact Sheet for Healthcare Providers: SeriousBroker.it  This test is not yet approved or cleared by the [place] FDA and has been authorized for detection and/or diagnosis of SARS-CoV-2 by FDA under an Emergency Use Authorization (EUA). This EUA will remain in effect (meaning this test can be used) for the duration of the COVID-19 declaration under Section 564(b)(1) of the Act, 21 U.S.C. section 360bbb-3(b)(1), unless the authorization is terminated or revoked.     Resp Syncytial Virus by PCR NEGATIVE NEGATIVE Final    Comment: (NOTE) Fact Sheet for Patients: BloggerCourse.com  Fact Sheet for Healthcare Providers: SeriousBroker.it  This test is not yet approved or cleared by the [place] FDA and has been authorized for detection and/or diagnosis of SARS-CoV-2 by FDA under an Emergency Use Authorization (EUA). This EUA will remain in effect (meaning this test can be used) for the duration of the COVID-19 declaration under Section 564(b)(1) of the Act, 21 U.S.C. section 360bbb-3(b)(1), unless the authorization is terminated or revoked.  Performed at [facility], [address], [place], [place] [address]   Resp panel by RT-PCR (RSV, Flu A&B, Covid) Anterior Nasal Swab     Status: Abnormal   Collection Time: 12/31/22  8:54 PM   Specimen: Anterior Nasal Swab  Result Value Ref Range Status   SARS Coronavirus 2 by RT PCR NEGATIVE NEGATIVE Final    Comment: (NOTE) SARS-CoV-2 target nucleic acids are NOT DETECTED.  The SARS-CoV-2 RNA is generally detectable in upper respiratory specimens during the acute phase of infection. The lowest concentration of  SARS-CoV-2 viral copies this assay can detect is 138 copies/mL. A negative result does not preclude SARS-Cov-2 infection and should not be used as the sole basis for treatment or other patient management decisions. A negative result may occur with  improper specimen collection/handling, submission of specimen other than nasopharyngeal swab, presence of viral mutation(s) within the areas targeted by this assay, and inadequate number of viral copies(<138 copies/mL). A negative result must be combined with clinical observations, patient history, and epidemiological information. The expected result is Negative.  Fact Sheet for Patients:  BloggerCourse.com  Fact Sheet for Healthcare Providers:  SeriousBroker.it  This test is no t yet approved or cleared by the [place] FDA and  has been authorized for detection and/or diagnosis of SARS-CoV-2 by FDA under an Emergency Use Authorization (EUA). This EUA will remain  in effect (meaning this test can be  used) for the duration of the COVID-19 declaration under Section 564(b)(1) of the Act, 21 U.S.C.section 360bbb-3(b)(1), unless the authorization is terminated  or revoked sooner.       Influenza A by PCR POSITIVE (A) NEGATIVE Final   Influenza B by PCR NEGATIVE NEGATIVE Final    Comment: (NOTE) The Xpert Xpress SARS-CoV-2/FLU/RSV plus assay is intended as an aid in the diagnosis of influenza from Nasopharyngeal swab specimens and should not be used as a sole basis for treatment. Nasal washings and aspirates are unacceptable for Xpert Xpress SARS-CoV-2/FLU/RSV testing.  Fact Sheet for Patients: BloggerCourse.com  Fact Sheet for Healthcare Providers: SeriousBroker.it  This test is not yet approved or cleared by the [place] FDA and has been authorized for detection and/or diagnosis of SARS-CoV-2 by FDA under an Emergency Use  Authorization (EUA). This EUA will remain in effect (meaning this test can be used) for the duration of the COVID-19 declaration under Section 564(b)(1) of the Act, 21 U.S.C. section 360bbb-3(b)(1), unless the authorization is terminated or revoked.     Resp Syncytial Virus by PCR NEGATIVE NEGATIVE Final    Comment: (NOTE) Fact Sheet for Patients: BloggerCourse.com  Fact Sheet for Healthcare Providers: SeriousBroker.it  This test is not yet approved or cleared by the [place] FDA and has been authorized for detection and/or diagnosis of SARS-CoV-2 by FDA under an Emergency Use Authorization (EUA). This EUA will remain in effect (meaning this test can be used) for the duration of the COVID-19 declaration under Section 564(b)(1) of the Act, 21 U.S.C. section 360bbb-3(b)(1), unless the authorization is terminated or revoked.  Performed at [facility], [address], [place], [place] [address]     Imaging reviewed:  [facility] Chest [facility] 1 View Result Date: 12/31/2022 CLINICAL DATA:  Coughing after attempting to eat EXAM: PORTABLE CHEST 1 VIEW COMPARISON:  Chest radiograph dated 12/31/2022 FINDINGS: Normal lung volumes. Mild bilateral interstitial opacities. Left retrocardiac patchy opacity. No pleural effusion or pneumothorax. Similar mildly enlarged cardiomediastinal silhouette. No acute osseous abnormality. IMPRESSION: 1. Mild bilateral interstitial opacities, which may represent pulmonary edema. 2. Left retrocardiac patchy opacity, which may represent atelectasis, aspiration, or pneumonia. 3. Similar mild cardiomegaly. Electronically Signed   By: Agustin Cree M.D.   On: 12/31/2022 21:08   DG Chest Port 1 View Result Date: 12/31/2022 CLINICAL DATA:  Shortness of breath EXAM: PORTABLE CHEST 1 VIEW COMPARISON:  Chest radiograph dated 01/29/2022 FINDINGS: Normal lung volumes. Persistent diffuse interstitial opacities. No pleural effusion or  pneumothorax. Similar enlarged cardiomediastinal silhouette. No acute osseous abnormality. IMPRESSION: Persistent diffuse interstitial opacities, likely pulmonary edema. Electronically Signed   By: Agustin Cree M.D.   On: 12/31/2022 14:21   DG Chest Port 1 View Result Date: 12/30/2022 CLINICAL DATA:  [id number] Aspiration into airway [id number] EXAM: PORTABLE CHEST 1 VIEW COMPARISON:  12/27/2022 FINDINGS: Heart is upper limits normal in size. Mediastinal contours within normal limits. Aortic atherosclerosis. Vascular congestion. Stable chronic increased interstitial markings with some improvement since prior study, likely improving edema superimposed on chronic lung disease. No effusions or acute bony abnormality. IMPRESSION: Borderline heart size with vascular congestion. Interstitial prominence improving since prior study, likely improving edema superimposed on chronic lung disease. Electronically Signed   By: Charlett Nose M.D.   On: 12/30/2022 02:44    ED Course:  Has been in the ED since 12/20 awaiting SNF currently pending auth.  Today worsening respiratory status, intermittently on 2 L.  Evaluation demonstrating influenza A, heart failure, AKI.   Assessment/Plan:  89 y.o. 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 Has been in ED since this time awaiting SNF placement, currently awaiting auth. Today noted to have borderline O2 sat, worsened resp status / breath sounds and evaluation revealed influenza, heart failure, AKI    Influenza A pneumonia Heart failure with preserved ejection fraction, with acute exacerbation Acute hypoxic respiratory failure secondary to above Desatted to 89% on room air, intermittently requiring 2 L.  Exam with diffuse Rales.  Labs positive flu A, BNP 1097. chest x-ray with  bilateral interstitial opacities and a left retrocardiac opacity.  Suspected combined influenza A and heart failure leading to his respiratory failure.  Also at risk of aspiration.  - Tamiflu 30 mg daily renally dosed x 5 days total - Lasix 40 mg IV x 1, scheduled for twice daily for now - Check TTE - Intake and output, daily weights - Albuterol neb every 4 hours as needed, incentive spirometry, flutter valve, encourage out of bed to chair. - SLP evaluation for oropharyngeal dysphagia  Acute kidney injury stage I History of CKD 3 with baseline creatinine approximately 1.5.  Elevated to 2 on repeat check today.  Suspect this is cardiorenal with his volume overload. - Diuresis per above - Has a Foley catheter that is functioning well  Slight abd distension / hyperresonance  - KUB to eval for ileus   Deconditioning - TOC arranging for SNF, currently accepted to [facility] but awaiting on auth  Chronic medical problems: History of dementia CAD with PCI: Continue his aspirin, rosuvastatin Hypertension: Continue amlodipine Diabetes: Blood glucose within goal range, can add SSI if runs high Urinary retention with chronic indwelling Foley: Continue routine Foley care   There is no height or weight on file to calculate BMI. ***   DVT prophylaxis:  {Blank single:19197::"Lovenox","SQ Heparin","IV heparin gtts","Xarelto","Eliquis","Coumadin","SCDs","***"} Code Status:  {Blank single:19197::"Full Code","DNR with Intubation","DNR/DNI(Do NOT Intubate)","Comfort Care","***"} Diet:  Diet Orders (From admission, onward)     Start     Ordered   12/31/22 2303  Diet 2 gram sodium Room service appropriate? Yes; Fluid consistency: Thin  Diet effective now       Question Answer Comment  Room service appropriate? Yes   Fluid consistency: Thin      12/31/22 2303           Family Communication:  ***  Consults:  ***  Admission status:   {Blank single:19197::"Observation","Inpatient"}, {Blank  single:19197::"Med-Surg","Telemetry bed","Step Down Unit"}  Severity of Illness: {Observation/Inpatient:21159}   Dolly Rias, MD [facility]  How to contact the [facility] Attending or Consulting provider 7A - 7P or covering provider during after hours 7P -7A, for this patient.  Check the care team in [facility] and look for a) attending/consulting [facility] provider listed and b) the [facility] team listed Log into [email or web address] and use [place]'s universal password to access. If you do not have the password, please contact the hospital operator. Locate the [facility] provider you are looking for under [facility] and page to a number that you can be directly reached. If you still have difficulty reaching the provider, please page the [facility] (Director on Call) for the Hospitalists listed on amion for assistance.  12/31/2022, 11:46 PM

## 2022-12-31 NOTE — ED Notes (Signed)
EDP notified of pt's lung sounds and scrotal edema.

## 2022-12-31 NOTE — ED Notes (Signed)
Encouraged patient to eat something off of his dinner tray, he said he wanted to drink some of his tea, however patient stated that filled him up and he wanted to go back to sleep.

## 2022-12-31 NOTE — ED Notes (Signed)
Pt's brief wet due to small amount of weeping. Skin care provided, clean brief applied. Pt repositioned.

## 2022-12-31 NOTE — ED Notes (Signed)
Family visiting with pt at this time

## 2022-12-31 NOTE — ED Provider Notes (Signed)
  Physical Exam  BP 122/64   Pulse (!) 52   Temp 98.3 F (36.8 C) (Oral)   Resp 19   SpO2 (!) 89%   Physical Exam  Procedures  Procedures  ED Course / MDM   Clinical Course as of 12/31/22 2047  Fri Dec 26, 2022  2200 COVID/influenza/RSV all negative.  Metabolic panel is unremarkable overall with renal function at baseline.  No significant leukocytosis or anemia compared to baseline.  No urinary tract infection here today.  Unclear cause of this patient's weakness with no underlying infectious or metabolic disturbance.  Considering he is too weak to stand at home he would benefit from a case management and physical therapy evaluation for help with rehabilitation/nursing facility placement.  Daughter agrees with this plan.  We will hold him in the ED until he can be seen by PT and case management to help determine best disposition [MP]    Clinical Course User Index [MP] Royanne Foots, DO   Medical Decision Making Amount and/or Complexity of Data Reviewed Labs: ordered. Radiology: ordered.  Risk Prescription drug management.   I assessed the patient at the request of our nursing staff who indicated that patient's O2 sats were dropping into the 90s and upper 80s.  Patient is a TOC hold at this time.  On my evaluation, patient noted to have rhonchorous breath sounds diffusely.  O2 sats were 89% on room air.  Heart rate was in the 40s.  Patient has no complaints from his side.  I reviewed patient's record.  It appears that he was admitted to the hospital in July and then again earlier this month.  He was admitted for altered mental status.  He was found to have UTI.  Patient has history of CAD, MGUS, CKD.  I have reviewed patient's records including recent discharge summaries, echocardiograms.  I also reviewed patient's vital signs.  Appears that when he arrived, his O2 sats were 100%, the 95%.  His heart rate has been in the 50s.  I reviewed previous EKG as well.  Plan is to  get basic labs, repeat chest x-ray and EKG.  Differential diagnosis includes CHF exacerbation, flu, pneumonia, aspiration pneumonitis.

## 2022-12-31 NOTE — H&P (Signed)
History and Physical    Terrance Miller ZOX:[id number] DOB: January 03, 1929 DOA: 12/26/2022  PCP: [name], DO   Patient coming from: Home   Chief Complaint:  Chief Complaint  Patient presents with   Altered Mental Status    HPI:  Terrance Miller is a 89 y.o. 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 Has been in ED since this time awaiting SNF placement, currently awaiting auth. Today noted to have borderline O2 sat, worsened resp status / breath sounds and evaluation revealed influenza, heart failure, AKI and called for admission. On interview he is confused and not able to provide significant information. He reports having a cough although not short of breath. Has scrotal swelling. Otherwise no other complaints.    Review of Systems:  ROS complete and negative except as marked above   Allergies  Allergen Reactions   Clobetasol     Prior to Admission medications   Medication Sig Start Date End Date Taking? Authorizing Provider  acetaminophen (TYLENOL) 325 MG tablet Take 2 tablets (650 mg total) by mouth every 6 (six) hours as needed for mild pain (or Fever >/= 101). 08/02/22  Yes Emokpae, Courage, MD  amLODipine (NORVASC) 2.5 MG tablet Take 1 tablet (2.5 mg total) by mouth daily. 11/25/22  Yes [name], DO  aspirin EC 81 MG tablet Take 1 tablet (81 mg total) by mouth daily with breakfast. Swallow whole. 08/02/22  Yes Shon Hale, MD  bisacodyl (DULCOLAX) 10 MG suppository Place 1 suppository (10 mg total) rectally every Monday, Wednesday, and Friday. Patient taking differently: Place 10 mg rectally daily as needed for mild constipation. 01/31/22  Yes Emokpae, Courage, MD  carvedilol (COREG) 6.25 MG tablet Take 1 tablet (6.25 mg total) by mouth 2 (two) times  daily. 08/20/22  Yes Sharee Holster, NP  cholecalciferol (VITAMIN D) 1000 units tablet Take 1,000 Units by mouth daily.   Yes [provider]  cyanocobalamin (VITAMIN B12) 1000 MCG tablet Take 1,000 mcg by mouth daily.   Yes [provider]  folic acid (FOLVITE) 1 MG tablet Take 1 tablet (1 mg total) by mouth daily. 08/20/22  Yes Sharee Holster, NP  furosemide (LASIX) 20 MG tablet Take 1 tablet (20 mg total) by mouth daily. 08/25/22 08/25/23 Yes Cook, [name], DO  latanoprost (XALATAN) 0.005 % ophthalmic solution Place 1 drop into both eyes 2 (two) times daily. 08/20/22  Yes Sharee Holster, NP  loperamide (IMODIUM) 2 MG capsule Take 2 mg by mouth as needed for diarrhea or loose stools.   Yes [provider]  polyethylene glycol (MIRALAX / GLYCOLAX) 17 g packet Take 17 g by mouth 2 (two) times daily. Patient taking differently: Take 17 g by mouth daily as needed for mild constipation. 01/31/22  Yes Emokpae, Courage, MD  potassium chloride (KLOR-CON) 10 MEQ tablet Take 1 tablet (10 mEq total) by mouth daily. Take While taking Lasix/furosemide 08/20/22  Yes Sharee Holster, NP  rosuvastatin (CRESTOR) 5 MG tablet Take 5 mg by mouth daily.   Yes [provider]  tamsulosin (FLOMAX) 0.4 MG CAPS capsule Take 1 capsule (0.4 mg total) by mouth daily. 08/20/22  Yes Sharee Holster, NP  cefdinir (OMNICEF) 300 MG capsule Take 1 capsule (300 mg total) by mouth every 12 (twelve) hours. Patient  not taking: Reported on 12/30/2022 12/22/22   Catarina Hartshorn, MD    Past Medical History:  Diagnosis Date   Acute ST elevation myocardial infarction (STEMI) of inferior wall [facility]) 2004   Coronary artery disease    DES x2 to the RCA 2004, residual disease managed medically   Hyperlipidemia    Hypertension    Type 2 diabetes mellitus (HCC)     Past Surgical History:  Procedure Laterality Date   BIOPSY  01/28/2022   Procedure: BIOPSY;  Surgeon: Marguerita Merles, Reuel Boom, MD;   Location[facility];  Service: Gastroenterology;;   CATARACT EXTRACTION Bilateral    CORONARY ANGIOPLASTY WITH STENT PLACEMENT  09/05/2002   stent RCA, 80% first diagonal, 70-80% mid-diagonal, 80% mid LAD stenosis   FLEXIBLE SIGMOIDOSCOPY N/A 01/28/2022   Procedure: FLEXIBLE SIGMOIDOSCOPY;  Surgeon: Dolores Frame, MD;  Location: [facility];  Service: Gastroenterology;  Laterality: N/A;   HYDROCELE EXCISION Bilateral 11/20/2021   Procedure: HYDROCELECTOMY ADULT;  Surgeon: Milderd Meager., MD;  Location: [facility];  Service: Urology;  Laterality: Bilateral;   IMPACTION REMOVAL  01/28/2022   Procedure: IMPACTION REMOVAL;  Surgeon: Dolores Frame, MD;  Location: [facility];  Service: Gastroenterology;;   NM MYOCAR PERF WALL MOTION  11/01/2008   Normal     reports that he quit smoking about 17 years ago. His smoking use included cigarettes. He has never used smokeless tobacco. He reports that he does not currently use alcohol. He reports that he does not use drugs.  Family History  Problem Relation Age of Onset   Hypertension Father    Diabetes Sister      Physical Exam: Vitals:   12/31/22 2010 12/31/22 2140 12/31/22 2145 12/31/22 2150  BP:      Pulse:  (!) 55 (!) 59 (!) 56  Resp:  (!) 23 (!) 22 (!) 21  Temp: 98.3 F (36.8 C)     TempSrc: Oral     SpO2:  91% 90% 91%    Gen: Awake, alert, chronically ill appearing  CV: Regular, normal S1, S2, no murmurs  Resp: Normal WOB, diffuse rales. Abd: Flat, hypoactive, hyperresonant, nontender MSK: Symmetric, 3+ pitting edema, woody skin changes,  Skin: See MSK  Neuro: Alert and interactive, oriented to self, hospital (not [place]), not time or situation.  Psych: euthymic, confused    Data review:   Labs reviewed, notable for:   Creatinine 2, up from baseline 1.5 BNP 1097 WBC 3.9, hemoglobin 8.4 macrocytic, platelets 72 Positive influenza A  Micro:  Results for orders placed or performed  during the hospital encounter of 12/26/22  Resp panel by RT-PCR (RSV, Flu A&B, Covid) Anterior Nasal Swab     Status: None   Collection Time: 12/26/22  7:17 PM   Specimen: Anterior Nasal Swab  Result Value Ref Range Status   SARS Coronavirus 2 by RT PCR NEGATIVE NEGATIVE Final    Comment: (NOTE) SARS-CoV-2 target nucleic acids are NOT DETECTED.  The SARS-CoV-2 RNA is generally detectable in upper respiratory specimens during the acute phase of infection. The lowest concentration of SARS-CoV-2 viral copies this assay can detect is 138 copies/mL. A negative result does not preclude SARS-Cov-2 infection and should not be used as the sole basis for treatment or other patient management decisions. A negative result may occur with  improper specimen collection/handling, submission of specimen other than nasopharyngeal swab, presence of viral mutation(s) within the areas targeted by this assay, and inadequate number of viral copies(<138  copies/mL). A negative result must be combined with clinical observations, patient history, and epidemiological information. The expected result is Negative.  Fact Sheet for Patients:  BloggerCourse.com  Fact Sheet for Healthcare Providers:  SeriousBroker.it  This test is no t yet approved or cleared by the [place] FDA and  has been authorized for detection and/or diagnosis of SARS-CoV-2 by FDA under an Emergency Use Authorization (EUA). This EUA will remain  in effect (meaning this test can be used) for the duration of the COVID-19 declaration under Section 564(b)(1) of the Act, 21 U.S.C.section 360bbb-3(b)(1), unless the authorization is terminated  or revoked sooner.       Influenza A by PCR NEGATIVE NEGATIVE Final   Influenza B by PCR NEGATIVE NEGATIVE Final    Comment: (NOTE) The Xpert Xpress SARS-CoV-2/FLU/RSV plus assay is intended as an aid in the diagnosis of influenza from  Nasopharyngeal swab specimens and should not be used as a sole basis for treatment. Nasal washings and aspirates are unacceptable for Xpert Xpress SARS-CoV-2/FLU/RSV testing.  Fact Sheet for Patients: BloggerCourse.com  Fact Sheet for Healthcare Providers: SeriousBroker.it  This test is not yet approved or cleared by the [place] FDA and has been authorized for detection and/or diagnosis of SARS-CoV-2 by FDA under an Emergency Use Authorization (EUA). This EUA will remain in effect (meaning this test can be used) for the duration of the COVID-19 declaration under Section 564(b)(1) of the Act, 21 U.S.C. section 360bbb-3(b)(1), unless the authorization is terminated or revoked.     Resp Syncytial Virus by PCR NEGATIVE NEGATIVE Final    Comment: (NOTE) Fact Sheet for Patients: BloggerCourse.com  Fact Sheet for Healthcare Providers: SeriousBroker.it  This test is not yet approved or cleared by the [place] FDA and has been authorized for detection and/or diagnosis of SARS-CoV-2 by FDA under an Emergency Use Authorization (EUA). This EUA will remain in effect (meaning this test can be used) for the duration of the COVID-19 declaration under Section 564(b)(1) of the Act, 21 U.S.C. section 360bbb-3(b)(1), unless the authorization is terminated or revoked.  Performed at [facility], [address] [place], [place] [address]   Resp panel by RT-PCR (RSV, Flu A&B, Covid) Anterior Nasal Swab     Status: Abnormal   Collection Time: 12/31/22  8:54 PM   Specimen: Anterior Nasal Swab  Result Value Ref Range Status   SARS Coronavirus 2 by RT PCR NEGATIVE NEGATIVE Final    Comment: (NOTE) SARS-CoV-2 target nucleic acids are NOT DETECTED.  The SARS-CoV-2 RNA is generally detectable in upper respiratory specimens during the acute phase of infection. The lowest concentration of  SARS-CoV-2 viral copies this assay can detect is 138 copies/mL. A negative result does not preclude SARS-Cov-2 infection and should not be used as the sole basis for treatment or other patient management decisions. A negative result may occur with  improper specimen collection/handling, submission of specimen other than nasopharyngeal swab, presence of viral mutation(s) within the areas targeted by this assay, and inadequate number of viral copies(<138 copies/mL). A negative result must be combined with clinical observations, patient history, and epidemiological information. The expected result is Negative.  Fact Sheet for Patients:  BloggerCourse.com  Fact Sheet for Healthcare Providers:  SeriousBroker.it  This test is no t yet approved or cleared by the [place] FDA and  has been authorized for detection and/or diagnosis of SARS-CoV-2 by FDA under an Emergency Use Authorization (EUA). This EUA will remain  in effect (meaning this test can be  used) for the duration of the COVID-19 declaration under Section 564(b)(1) of the Act, 21 U.S.C.section 360bbb-3(b)(1), unless the authorization is terminated  or revoked sooner.       Influenza A by PCR POSITIVE (A) NEGATIVE Final   Influenza B by PCR NEGATIVE NEGATIVE Final    Comment: (NOTE) The Xpert Xpress SARS-CoV-2/FLU/RSV plus assay is intended as an aid in the diagnosis of influenza from Nasopharyngeal swab specimens and should not be used as a sole basis for treatment. Nasal washings and aspirates are unacceptable for Xpert Xpress SARS-CoV-2/FLU/RSV testing.  Fact Sheet for Patients: BloggerCourse.com  Fact Sheet for Healthcare Providers: SeriousBroker.it  This test is not yet approved or cleared by the [place] FDA and has been authorized for detection and/or diagnosis of SARS-CoV-2 by FDA under an Emergency Use  Authorization (EUA). This EUA will remain in effect (meaning this test can be used) for the duration of the COVID-19 declaration under Section 564(b)(1) of the Act, 21 U.S.C. section 360bbb-3(b)(1), unless the authorization is terminated or revoked.     Resp Syncytial Virus by PCR NEGATIVE NEGATIVE Final    Comment: (NOTE) Fact Sheet for Patients: BloggerCourse.com  Fact Sheet for Healthcare Providers: SeriousBroker.it  This test is not yet approved or cleared by the [place] FDA and has been authorized for detection and/or diagnosis of SARS-CoV-2 by FDA under an Emergency Use Authorization (EUA). This EUA will remain in effect (meaning this test can be used) for the duration of the COVID-19 declaration under Section 564(b)(1) of the Act, 21 U.S.C. section 360bbb-3(b)(1), unless the authorization is terminated or revoked.  Performed at [facility], [address] [place], [place] [address]     Imaging reviewed:  [facility] Chest [facility] 1 View Result Date: 12/31/2022 CLINICAL DATA:  Coughing after attempting to eat EXAM: PORTABLE CHEST 1 VIEW COMPARISON:  Chest radiograph dated 12/31/2022 FINDINGS: Normal lung volumes. Mild bilateral interstitial opacities. Left retrocardiac patchy opacity. No pleural effusion or pneumothorax. Similar mildly enlarged cardiomediastinal silhouette. No acute osseous abnormality. IMPRESSION: 1. Mild bilateral interstitial opacities, which may represent pulmonary edema. 2. Left retrocardiac patchy opacity, which may represent atelectasis, aspiration, or pneumonia. 3. Similar mild cardiomegaly. Electronically Signed   By: Agustin Cree M.D.   On: 12/31/2022 21:08   DG Chest Port 1 View Result Date: 12/31/2022 CLINICAL DATA:  Shortness of breath EXAM: PORTABLE CHEST 1 VIEW COMPARISON:  Chest radiograph dated 01/29/2022 FINDINGS: Normal lung volumes. Persistent diffuse interstitial opacities. No pleural effusion or  pneumothorax. Similar enlarged cardiomediastinal silhouette. No acute osseous abnormality. IMPRESSION: Persistent diffuse interstitial opacities, likely pulmonary edema. Electronically Signed   By: Agustin Cree M.D.   On: 12/31/2022 14:21   DG Chest Port 1 View Result Date: 12/30/2022 CLINICAL DATA:  [id number] Aspiration into airway [id number] EXAM: PORTABLE CHEST 1 VIEW COMPARISON:  12/27/2022 FINDINGS: Heart is upper limits normal in size. Mediastinal contours within normal limits. Aortic atherosclerosis. Vascular congestion. Stable chronic increased interstitial markings with some improvement since prior study, likely improving edema superimposed on chronic lung disease. No effusions or acute bony abnormality. IMPRESSION: Borderline heart size with vascular congestion. Interstitial prominence improving since prior study, likely improving edema superimposed on chronic lung disease. Electronically Signed   By: Charlett Nose M.D.   On: 12/30/2022 02:44    ED Course:  Has been in the ED since 12/20 awaiting SNF currently pending auth.  Today worsening respiratory status, intermittently on 2 L.  Evaluation demonstrating influenza A, heart failure, AKI.   Assessment/Plan:  89 y.o. 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 Has been in ED since this time awaiting SNF placement, currently awaiting auth. Today noted to have borderline O2 sat, worsened resp status / breath sounds and evaluation revealed influenza, heart failure, AKI    Influenza A pneumonia Heart failure with preserved ejection fraction, with acute exacerbation Acute hypoxic respiratory failure secondary to above Desatted to 89% on room air, intermittently requiring 2 L.  Exam with diffuse Rales.  Labs positive flu A, BNP 1097. chest x-ray with  bilateral interstitial opacities and a left retrocardiac opacity.  Suspected combined influenza A and heart failure leading to his respiratory failure.  Also at risk of aspiration.  - Tamiflu 30 mg daily renally dosed x 5 days total - Lasix 40 mg IV x 1, scheduled for twice daily for now - Check TTE - Intake and output, daily weights - Albuterol neb every 4 hours as needed, incentive spirometry, flutter valve, encourage out of bed to chair. - SLP evaluation for oropharyngeal dysphagia  Acute kidney injury stage I History of CKD 3 with baseline creatinine approximately 1.5.  Elevated to 2 on repeat check today.  Suspect this is cardiorenal with his volume overload. - Diuresis per above - Has a Foley catheter that is functioning well  Slight abd distension / hyperresonance  - KUB to eval for ileus   Deconditioning - TOC arranging for SNF, currently accepted to [facility] but awaiting on auth  Chronic medical problems: History of dementia CAD with PCI: Continue his aspirin, rosuvastatin Hypertension: Continue amlodipine Diabetes: Blood glucose within goal range, can add SSI if runs high Urinary retention with chronic indwelling Foley: Continue tamsulosin, continue routine Foley care   There is no height or weight on file to calculate BMI.    DVT prophylaxis:  SQ Heparin Code Status:  DNR/DNI(Do NOT Intubate) - confirmed with daughter  Diet:  Diet Orders (From admission, onward)     Start     Ordered   12/31/22 2303  Diet 2 gram sodium Room service appropriate? Yes; Fluid consistency: Thin  Diet effective now       Question Answer Comment  Room service appropriate? Yes   Fluid consistency: Thin      12/31/22 2303           Family Communication:  Yes spoke with Daughter [name] over the phone  Consults:  None   Admission status:   Inpatient, Telemetry bed  Severity of Illness: The appropriate patient status for this patient is INPATIENT. Inpatient status is judged to  be reasonable and necessary in order to provide the required intensity of service to ensure the patient's safety. The patient's presenting symptoms, physical exam findings, and initial radiographic and laboratory data in the context of their chronic comorbidities is felt to place them at high risk for further clinical deterioration. Furthermore, it is not anticipated that the patient will be medically stable for discharge from the hospital within 2 midnights of admission.   * I certify that at the point of admission it is my clinical judgment that the patient will require inpatient hospital care spanning beyond 2 midnights from the point of admission due to high intensity of service, high risk for further deterioration and high frequency of surveillance required.*   Dolly Rias, MD [facility]  How to contact the [facility] Attending or Consulting provider 7A - 7P or covering provider during after hours 7P -7A, for this patient.  Check the care team in [facility] and look for a) attending/consulting [facility] provider listed and b) the [facility] team listed Log into [email or web address] and use [place]'s universal password to access. If you do not have the password, please contact the hospital operator. Locate the [facility] provider you are looking for under [facility] and page to a number that you can be directly reached. If you still have difficulty reaching the provider, please page the [facility] (Director on Call) for the Hospitalists listed on amion for assistance.  12/31/2022, 11:46 PM

## 2023-01-01 ENCOUNTER — Other Ambulatory Visit (HOSPITAL_COMMUNITY): Payer: Medicare Other

## 2023-01-01 ENCOUNTER — Encounter (HOSPITAL_COMMUNITY): Payer: Self-pay | Admitting: Internal Medicine

## 2023-01-01 ENCOUNTER — Other Ambulatory Visit (HOSPITAL_COMMUNITY): Payer: Self-pay | Admitting: *Deleted

## 2023-01-01 ENCOUNTER — Inpatient Hospital Stay (HOSPITAL_COMMUNITY): Payer: Medicare Other

## 2023-01-01 DIAGNOSIS — J101 Influenza due to other identified influenza virus with other respiratory manifestations: Secondary | ICD-10-CM | POA: Diagnosis not present

## 2023-01-01 DIAGNOSIS — Z7189 Other specified counseling: Secondary | ICD-10-CM | POA: Diagnosis not present

## 2023-01-01 DIAGNOSIS — R531 Weakness: Secondary | ICD-10-CM

## 2023-01-01 DIAGNOSIS — Z515 Encounter for palliative care: Secondary | ICD-10-CM | POA: Diagnosis not present

## 2023-01-01 DIAGNOSIS — R410 Disorientation, unspecified: Secondary | ICD-10-CM | POA: Diagnosis not present

## 2023-01-01 LAB — CBC
HCT: 28.2 % — ABNORMAL LOW (ref 39.0–52.0)
Hemoglobin: 9.1 g/dL — ABNORMAL LOW (ref 13.0–17.0)
MCH: 33.3 pg (ref 26.0–34.0)
MCHC: 32.3 g/dL (ref 30.0–36.0)
MCV: 103.3 fL — ABNORMAL HIGH (ref 80.0–100.0)
Platelets: 69 10*3/uL — ABNORMAL LOW (ref 150–400)
RBC: 2.73 MIL/uL — ABNORMAL LOW (ref 4.22–5.81)
RDW: 28 % — ABNORMAL HIGH (ref 11.5–15.5)
WBC: 6.6 10*3/uL (ref 4.0–10.5)
nRBC: 0.5 % — ABNORMAL HIGH (ref 0.0–0.2)

## 2023-01-01 LAB — BASIC METABOLIC PANEL
Anion gap: 10 (ref 5–15)
BUN: 36 mg/dL — ABNORMAL HIGH (ref 8–23)
CO2: 22 mmol/L (ref 22–32)
Calcium: 8.8 mg/dL — ABNORMAL LOW (ref 8.9–10.3)
Chloride: 110 mmol/L (ref 98–111)
Creatinine, Ser: 2.05 mg/dL — ABNORMAL HIGH (ref 0.61–1.24)
GFR, Estimated: 30 mL/min — ABNORMAL LOW (ref >60)
Glucose, Bld: 117 mg/dL — ABNORMAL HIGH (ref 70–99)
Potassium: 3.5 mmol/L (ref 3.5–5.1)
Sodium: 142 mmol/L (ref 135–145)

## 2023-01-01 LAB — PHOSPHORUS: Phosphorus: 4 mg/dL (ref 2.5–4.6)

## 2023-01-01 LAB — MAGNESIUM: Magnesium: 2.1 mg/dL (ref 1.7–2.4)

## 2023-01-01 MED ORDER — CHLORHEXIDINE GLUCONATE CLOTH 2 % EX PADS
6.0000 | MEDICATED_PAD | Freq: Every day | CUTANEOUS | Status: DC
  Administered 2023-01-01 – 2023-01-05 (×5): 6 via TOPICAL

## 2023-01-01 MED ORDER — BISACODYL 10 MG RE SUPP
10.0000 mg | Freq: Every day | RECTAL | Status: DC
  Administered 2023-01-01 – 2023-01-05 (×3): 10 mg via RECTAL
  Filled 2023-01-01 (×4): qty 1

## 2023-01-01 NOTE — ED Notes (Signed)
Pt sitting in chair. Clear soda given. Daughter updated. Nad.

## 2023-01-01 NOTE — TOC Progression Note (Signed)
Transition of Care [facility] - Progression Note    Patient Details  Name: Terrance Miller MRN: [id number] Date of Birth: [birth year]  Transition of Care [facility]) CM/SW Contact  Leitha Bleak, RN Phone Number: 01/01/2023, 10:29 AM  Clinical Narrative:   [facility] approved and expires today. Patient now has the flu and being admitted. TOC following with [employer] to restart INS AUTH.    Barriers to Discharge: ED SNF auth  Expected Discharge Plan and Services        Social Determinants of Health (SDOH) Interventions SDOH Screenings   Food Insecurity: No Food Insecurity (12/24/2022)  Housing: Low Risk  (12/24/2022)  Transportation Needs: No Transportation Needs (12/24/2022)  Utilities: Not At Risk (12/24/2022)  Alcohol Screen: Low Risk  (07/25/2022)  Depression (PHQ2-9): Low Risk  (08/25/2022)  Financial Resource Strain: Low Risk  (08/20/2022)  Physical Activity: Insufficiently Active (07/25/2022)  Social Connections: Socially Isolated (07/25/2022)  Stress: No Stress Concern Present (08/20/2022)  Tobacco Use: Medium Risk (01/01/2023)  Health Literacy: Adequate Health Literacy (07/25/2022)    Readmission Risk Interventions    12/20/2022   11:51 AM  Readmission Risk Prevention Plan  Transportation Screening Complete  HRI or Home Care Consult Complete  Social Work Consult for Recovery Care Planning/Counseling Complete  Palliative Care Screening Not Applicable  Medication Review Oceanographer) Complete

## 2023-01-01 NOTE — ED Notes (Signed)
Per DR Flossie Dibble, pt can have clear liquids and meds if passes bedside swallow screen by RN while waiting on SLP.
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
## 2023-01-01 NOTE — Progress Notes (Signed)
[facility] PROGRESS NOTE    Patient: Terrance Miller                            PCP: [name], DO                    DOB: 1928-05-12            DOA: 12/26/2022 DGL:[id number]             DOS: 01/01/2023, 9:51 AM   LOS: 1 day   Date of Service: The patient was seen and examined on 01/01/2023  Subjective:   The patient was seen and examined this morning. Hemodynamically stable. No issues overnight. No gas or bowel movements per patient and nursing staff  Brief Narrative:   Terrance Miller is a 89 y.o. 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 Has been in ED since this time awaiting SNF placement, currently awaiting auth. Today noted to have borderline O2 sat, worsened resp status / breath sounds and evaluation revealed influenza, heart failure, AKI and called for admission. On interview he is confused and not able to provide significant information. He reports having a cough although not short of breath. Has scrotal swelling. Otherwise no other complaints.    Assessment/Plan: Influenza A, Viral pneumonia, Heart failure Acute respiratory failure Acute kidney injury Severe debility, deconditioning Hypertension Coronary artery disease, Diabetes type 2 Urinary retention with chronic Foley cath     Influenza A pneumonia Heart failure with preserved ejection fraction, with acute exacerbation Acute hypoxic respiratory failure secondary to above  -Seen and examined this morning, satting 93% on room air -POA: Desatted to 89% on room air, requiring 2 L, subsequently weaned off -  positive flu A,   - BNP 1097.  - Chest x-ray with bilateral interstitial opacities and a left retrocardiac opacity.  Suspected combined influenza A and heart failure leading to his respiratory  failure.  Also at risk of aspiration.   - Continue Tamiflu 30 mg daily renally dosed x 5 days total - Lasix 40 mg IV x 1, scheduled for twice daily -will continue for now - TTE:    - Intake and output, daily weights - Albuterol neb every 4 hours as needed, incentive spirometry, flutter valve, encourage out of bed to chair. - SLP evaluation for oropharyngeal dysphagia   Acute kidney injury stage I History of CKD 3 with baseline creatinine approximately 1.5.   Elevated to 2.05   - Suspect this is cardiorenal with his volume overload. - Diuresis per above - Has a Foley catheter that is functioning well   Slight abd distension / hyperresonance  - JJO:[id number]: Gaseous distention of stomach, small bowel, and colon most likely representing ileus but could indicate obstruction  -Continuing bowel regimen (Colace, Senokot, MiraLAX) -Clear liquid diet for now   Deconditioning - TOC arranging for SNF, currently accepted to [facility] but awaiting on auth    History of dementia -not any medications at home, no behavior disturbances  CAD with PCI: Continue his aspirin, rosuvastatin  Hypertension: Continue amlodipine  Diabetes: Blood glucose within goal range, can add SSI if runs high  Urinary retention with chronic indwelling Foley: Continue tamsulosin, continue routine Foley care   ----------------------------------------------------------------------------------------------------------------------------------------- Nutritional status:  The patient's  BMI is: There is no height or weight on file to calculate BMI. I agree with the assessment and plan as outlined below: Nutrition Status:       Skin Assessment: I have examined the patient's skin and I agree with the wound assessment as performed by wound care team As outlined belowe: Pressure Injury 01/28/22 Scrotum Left;Right;Posterior Stage 2 -  Partial thickness loss of dermis presenting as a shallow open injury with a  red, pink wound bed without slough. Partial thickness (Active)  01/28/22 0040  Location: Scrotum  Location Orientation: Left;Right;Posterior  Staging: Stage 2 -  Partial thickness loss of dermis presenting as a shallow open injury with a red, pink wound bed without slough.  Wound Description (Comments): Partial thickness  Present on Admission: Yes     Pressure Injury 07/30/22 Scrotum Stage 2 -  Partial thickness loss of dermis presenting as a shallow open injury with a red, pink wound bed without slough. (Active)  07/30/22 0028  Location: Scrotum  Location Orientation:   Staging: Stage 2 -  Partial thickness loss of dermis presenting as a shallow open injury with a red, pink wound bed without slough.  Wound Description (Comments):   Present on Admission: Yes  Dressing Type None 01/01/23 0738     Pressure Injury 08/01/22 Buttocks Right;Left Stage 2 -  Partial thickness loss of dermis presenting as a shallow open injury with a red, pink wound bed without slough. small, shallow areas bilateral buttocks near rectum (Active)  08/01/22 0900  Location: Buttocks  Location Orientation: Right;Left  Staging: Stage 2 -  Partial thickness loss of dermis presenting as a shallow open injury with a red, pink wound bed without slough.  Wound Description (Comments): small, shallow areas bilateral buttocks near rectum  Present on Admission:      ---------------------------------------------------------------------------------------------------------------------------------------------------- Cultures; Blood Cultures x 2 >> pending Sputum Culture >> pending  Influenza A positive ------------------------------------------------------------------------------------------------------------------------------------------------  DVT prophylaxis:  heparin injection 5,000 Units Start: 01/01/23 0600   Code Status:   Code Status: Limited: Do not attempt resuscitation (DNR) -DNR-LIMITED -Do Not Intubate/DNI    Family Communication: No family member present at bedside- -Advance care planning has been discussed.   Admission status:   Status is: Inpatient Remains inpatient appropriate because: Needing IV medications,   Disposition: From  - home             Planning for discharge in 2 days: to SNF [facility]  Procedures:   No admission procedures for hospital encounter.   Antimicrobials:  Anti-infectives (From admission, onward)    Start     Dose/Rate Route Frequency Ordered Stop   01/01/23 1000  oseltamivir (TAMIFLU) capsule 30 mg        30 mg Oral Daily 12/31/22 2306 01/05/23 0959   12/31/22 2230  oseltamivir (TAMIFLU) capsule 30 mg        30 mg Oral  Once 12/31/22 2217 01/01/23 0002        Medication:   amLODipine  2.5 mg Oral Daily   aspirin EC  81 mg Oral Q breakfast   bisacodyl  10 mg Rectal Daily   feeding supplement  237 mL Oral BID BM   furosemide  40 mg Intravenous BID   heparin  5,000 Units Subcutaneous Q8H   latanoprost  1 drop Both Eyes BID   oseltamivir  30 mg Oral Daily   polyethylene glycol  17 g Oral BID   rosuvastatin  5 mg Oral Daily   senna  2  tablet Oral QHS   sodium chloride flush  3 mL Intravenous Q12H   tamsulosin  0.4 mg Oral Daily    acetaminophen, albuterol, bisacodyl   Objective:   Vitals:   01/01/23 0545 01/01/23 0615 01/01/23 0740 01/01/23 0920  BP:  127/68  (!) 136/91  Pulse: 63 (!) 54 60   Resp: 18 19 20    Temp:   98 F (36.7 C)   TempSrc:   Oral   SpO2: 93% 95% 93%     Intake/Output Summary (Last 24 hours) at 01/01/2023 0951 Last data filed at 01/01/2023 0600 Gross per 24 hour  Intake --  Output 1400 ml  Net -1400 ml   Filed Weights     Physical examination:   General:  AAO x 2,  cooperative, no distress;   HEENT:  Normocephalic, PERRL, otherwise with in Normal limits   Neuro:  CNII-XII intact. , normal motor and sensation, reflexes intact   Lungs:   Clear to auscultation BL, Respirations unlabored,  No  wheezes / crackles  Cardio:    S1/S2, RRR, No murmure, No Rubs or Gallops   Abdomen:  Soft, non-tender, bowel sounds active all four quadrants, no guarding or peritoneal signs.  Muscular  skeletal:  Limited exam -Sever global generalized weaknesses - in bed, able to move all 4 extremities,   2+ pulses,  symmetric, +1  pitting edema  Skin:  Dry, warm to touch, negative for any Rashes,  Wounds: Please see nursing documentation  Pressure Injury 01/28/22 Scrotum Left;Right;Posterior Stage 2 -  Partial thickness loss of dermis presenting as a shallow open injury with a red, pink wound bed without slough. Partial thickness (Active)  01/28/22 0040  Location: Scrotum  Location Orientation: Left;Right;Posterior  Staging: Stage 2 -  Partial thickness loss of dermis presenting as a shallow open injury with a red, pink wound bed without slough.  Wound Description (Comments): Partial thickness  Present on Admission: Yes     Pressure Injury 07/30/22 Scrotum Stage 2 -  Partial thickness loss of dermis presenting as a shallow open injury with a red, pink wound bed without slough. (Active)  07/30/22 0028  Location: Scrotum  Location Orientation:   Staging: Stage 2 -  Partial thickness loss of dermis presenting as a shallow open injury with a red, pink wound bed without slough.  Wound Description (Comments):   Present on Admission: Yes  Dressing Type None 01/01/23 0738     Pressure Injury 08/01/22 Buttocks Right;Left Stage 2 -  Partial thickness loss of dermis presenting as a shallow open injury with a red, pink wound bed without slough. small, shallow areas bilateral buttocks near rectum (Active)  08/01/22 0900  Location: Buttocks  Location Orientation: Right;Left  Staging: Stage 2 -  Partial thickness loss of dermis presenting as a shallow open injury with a red, pink wound bed without slough.  Wound Description (Comments): small, shallow areas bilateral buttocks near rectum  Present on Admission:            ------------------------------------------------------------------------------------------------------------------------------------------    LABs:     Latest Ref Rng & Units 01/01/2023    4:08 AM 12/31/2022    9:02 PM 12/26/2022    6:34 PM  CBC  WBC 4.0 - 10.5 K/uL 6.6  3.9  5.4   Hemoglobin 13.0 - 17.0 g/dL 9.1  8.4  9.2   Hematocrit 39.0 - 52.0 % 28.2  26.8  28.0   Platelets 150 - 400 K/uL 69  72  102  Latest Ref Rng & Units 01/01/2023    4:08 AM 12/31/2022    9:02 PM 12/26/2022    6:34 PM  CMP  Glucose 70 - 99 mg/dL 962  952  89   BUN 8 - 23 mg/dL 36  34  23   Creatinine 0.61 - 1.24 mg/dL 8.41  3.24  4.01   Sodium 135 - 145 mmol/L 142  142  140   Potassium 3.5 - 5.1 mmol/L 3.5  3.5  3.8   Chloride 98 - 111 mmol/L 110  111  110   CO2 22 - 32 mmol/L 22  21  21    Calcium 8.9 - 10.3 mg/dL 8.8  9.0  9.0   Total Protein 6.5 - 8.1 g/dL   7.1   Total Bilirubin <1.2 mg/dL   1.5   Alkaline Phos 38 - 126 U/L   74   AST 15 - 41 U/L   27   ALT 0 - 44 U/L   24        Micro Results Recent Results (from the past 240 hours)  Resp panel by RT-PCR (RSV, Flu A&B, Covid) Anterior Nasal Swab     Status: None   Collection Time: 12/26/22  7:17 PM   Specimen: Anterior Nasal Swab  Result Value Ref Range Status   SARS Coronavirus 2 by RT PCR NEGATIVE NEGATIVE Final    Comment: (NOTE) SARS-CoV-2 target nucleic acids are NOT DETECTED.  The SARS-CoV-2 RNA is generally detectable in upper respiratory specimens during the acute phase of infection. The lowest concentration of SARS-CoV-2 viral copies this assay can detect is 138 copies/mL. A negative result does not preclude SARS-Cov-2 infection and should not be used as the sole basis for treatment or other patient management decisions. A negative result may occur with  improper specimen collection/handling, submission of specimen other than nasopharyngeal swab, presence of viral mutation(s) within the areas targeted  by this assay, and inadequate number of viral copies(<138 copies/mL). A negative result must be combined with clinical observations, patient history, and epidemiological information. The expected result is Negative.  Fact Sheet for Patients:  BloggerCourse.com  Fact Sheet for Healthcare Providers:  SeriousBroker.it  This test is no t yet approved or cleared by the [place] FDA and  has been authorized for detection and/or diagnosis of SARS-CoV-2 by FDA under an Emergency Use Authorization (EUA). This EUA will remain  in effect (meaning this test can be used) for the duration of the COVID-19 declaration under Section 564(b)(1) of the Act, 21 U.S.C.section 360bbb-3(b)(1), unless the authorization is terminated  or revoked sooner.       Influenza A by PCR NEGATIVE NEGATIVE Final   Influenza B by PCR NEGATIVE NEGATIVE Final    Comment: (NOTE) The Xpert Xpress SARS-CoV-2/FLU/RSV plus assay is intended as an aid in the diagnosis of influenza from Nasopharyngeal swab specimens and should not be used as a sole basis for treatment. Nasal washings and aspirates are unacceptable for Xpert Xpress SARS-CoV-2/FLU/RSV testing.  Fact Sheet for Patients: BloggerCourse.com  Fact Sheet for Healthcare Providers: SeriousBroker.it  This test is not yet approved or cleared by the [place] FDA and has been authorized for detection and/or diagnosis of SARS-CoV-2 by FDA under an Emergency Use Authorization (EUA). This EUA will remain in effect (meaning this test can be used) for the duration of the COVID-19 declaration under Section 564(b)(1) of the Act, 21 U.S.C. section 360bbb-3(b)(1), unless the authorization is terminated or revoked.     Resp  Syncytial Virus by PCR NEGATIVE NEGATIVE Final    Comment: (NOTE) Fact Sheet for Patients: BloggerCourse.com  Fact  Sheet for Healthcare Providers: SeriousBroker.it  This test is not yet approved or cleared by the [place] FDA and has been authorized for detection and/or diagnosis of SARS-CoV-2 by FDA under an Emergency Use Authorization (EUA). This EUA will remain in effect (meaning this test can be used) for the duration of the COVID-19 declaration under Section 564(b)(1) of the Act, 21 U.S.C. section 360bbb-3(b)(1), unless the authorization is terminated or revoked.  Performed at [facility], [address], [place], [place] [address]   Resp panel by RT-PCR (RSV, Flu A&B, Covid) Anterior Nasal Swab     Status: Abnormal   Collection Time: 12/31/22  8:54 PM   Specimen: Anterior Nasal Swab  Result Value Ref Range Status   SARS Coronavirus 2 by RT PCR NEGATIVE NEGATIVE Final    Comment: (NOTE) SARS-CoV-2 target nucleic acids are NOT DETECTED.  The SARS-CoV-2 RNA is generally detectable in upper respiratory specimens during the acute phase of infection. The lowest concentration of SARS-CoV-2 viral copies this assay can detect is 138 copies/mL. A negative result does not preclude SARS-Cov-2 infection and should not be used as the sole basis for treatment or other patient management decisions. A negative result may occur with  improper specimen collection/handling, submission of specimen other than nasopharyngeal swab, presence of viral mutation(s) within the areas targeted by this assay, and inadequate number of viral copies(<138 copies/mL). A negative result must be combined with clinical observations, patient history, and epidemiological information. The expected result is Negative.  Fact Sheet for Patients:  BloggerCourse.com  Fact Sheet for Healthcare Providers:  SeriousBroker.it  This test is no t yet approved or cleared by the [place] FDA and  has been authorized for detection and/or diagnosis of  SARS-CoV-2 by FDA under an Emergency Use Authorization (EUA). This EUA will remain  in effect (meaning this test can be used) for the duration of the COVID-19 declaration under Section 564(b)(1) of the Act, 21 U.S.C.section 360bbb-3(b)(1), unless the authorization is terminated  or revoked sooner.       Influenza A by PCR POSITIVE (A) NEGATIVE Final   Influenza B by PCR NEGATIVE NEGATIVE Final    Comment: (NOTE) The Xpert Xpress SARS-CoV-2/FLU/RSV plus assay is intended as an aid in the diagnosis of influenza from Nasopharyngeal swab specimens and should not be used as a sole basis for treatment. Nasal washings and aspirates are unacceptable for Xpert Xpress SARS-CoV-2/FLU/RSV testing.  Fact Sheet for Patients: BloggerCourse.com  Fact Sheet for Healthcare Providers: SeriousBroker.it  This test is not yet approved or cleared by the [place] FDA and has been authorized for detection and/or diagnosis of SARS-CoV-2 by FDA under an Emergency Use Authorization (EUA). This EUA will remain in effect (meaning this test can be used) for the duration of the COVID-19 declaration under Section 564(b)(1) of the Act, 21 U.S.C. section 360bbb-3(b)(1), unless the authorization is terminated or revoked.     Resp Syncytial Virus by PCR NEGATIVE NEGATIVE Final    Comment: (NOTE) Fact Sheet for Patients: BloggerCourse.com  Fact Sheet for Healthcare Providers: SeriousBroker.it  This test is not yet approved or cleared by the [place] FDA and has been authorized for detection and/or diagnosis of SARS-CoV-2 by FDA under an Emergency Use Authorization (EUA). This EUA will remain in effect (meaning this test can be used) for the duration of the COVID-19 declaration under Section 564(b)(1) of the  Act, 21 U.S.C. section 360bbb-3(b)(1), unless the authorization is terminated  or revoked.  Performed at [facility], [address] [place], [place] [address]     Radiology Reports DG Abd 1 View Result Date: 01/01/2023 CLINICAL DATA:  Abdominal distention EXAM: ABDOMEN - 1 VIEW COMPARISON:  Chest radiograph 12/31/2022. CT abdomen and pelvis 01/27/2022 FINDINGS: Moderate gaseous distention of the stomach, possibly physiologic or dysmotility. Mild gaseous distention of mid abdominal small bowel and colon. Changes could indicate ileus or obstruction. No radiopaque stones. Degenerative changes in the spine and hips. Vascular calcifications. Lung bases are clear. IMPRESSION: Gaseous distention of stomach, small bowel, and colon most likely representing ileus but could indicate obstruction. Electronically Signed   By: Burman Nieves M.D.   On: 01/01/2023 00:31   DG Chest Port 1 View Result Date: 12/31/2022 CLINICAL DATA:  Coughing after attempting to eat EXAM: PORTABLE CHEST 1 VIEW COMPARISON:  Chest radiograph dated 12/31/2022 FINDINGS: Normal lung volumes. Mild bilateral interstitial opacities. Left retrocardiac patchy opacity. No pleural effusion or pneumothorax. Similar mildly enlarged cardiomediastinal silhouette. No acute osseous abnormality. IMPRESSION: 1. Mild bilateral interstitial opacities, which may represent pulmonary edema. 2. Left retrocardiac patchy opacity, which may represent atelectasis, aspiration, or pneumonia. 3. Similar mild cardiomegaly. Electronically Signed   By: Agustin Cree M.D.   On: 12/31/2022 21:08   DG Chest Port 1 View Result Date: 12/31/2022 CLINICAL DATA:  Shortness of breath EXAM: PORTABLE CHEST 1 VIEW COMPARISON:  Chest radiograph dated 01/29/2022 FINDINGS: Normal lung volumes. Persistent diffuse interstitial opacities. No pleural effusion or pneumothorax. Similar enlarged cardiomediastinal silhouette. No acute osseous abnormality. IMPRESSION: Persistent diffuse interstitial opacities, likely pulmonary edema. Electronically Signed   By: Agustin Cree M.D.   On: 12/31/2022 14:21    SIGNED: Kendell Bane, MD, FHM. FAAFP. [facility] - [facility]  Time spent - 55 min.  In seeing, evaluating and examining the patient. Reviewing medical records, labs, drawn plan of care. [facility],  Pager (please use [email or web address] to page/ text) Please use Epic Secure Chat for non-urgent communication (7AM-7PM)  If 7PM-7AM, please contact night-coverage [email or web address], 01/01/2023, 9:51 AM

## 2023-01-01 NOTE — Progress Notes (Signed)
Physical Therapy Treatment Patient Details Name: Terrance Miller MRN: [id number] DOB: July 08, 1928 Today's Date: 01/01/2023   History of Present Illness Terrance Miller is a 89 y.o. male.  With history of CAD, MI, CKD, MGUS, chronic indwelling Foley type 2 diabetes who presents to the ED for generalized weakness.  Recently admitted for confusion and hallucinations and generalized weakness suspected to be due to UTI.  Was admitted on the 13th and discharged on the 15th.  Has been progressively weak and confused at home since the time of his discharge.  Discharged with cefdinir x 5 days.  Patient himself has no systemic complaints at this time.  He is unable to provide additional history secondary to confusion    PT Comments  Patient agreeable and motivated for therapy.  Patient demonstrates slow labored movement for sitting up at bedside with most difficulty moving legs due to weakness, limited to a few slow labored side steps with mostly shuffling of feet due to poor standing balance and c/o fatigue.  Patient tolerated sitting up in chair after therapy - nursing staff notified. Patient will benefit from continued skilled physical therapy in hospital and recommended venue below to increase strength, balance, endurance for safe ADLs and gait.       If plan is discharge home, recommend the following: A lot of help with walking and/or transfers;A lot of help with bathing/dressing/bathroom;Assistance with cooking/housework;Help with stairs or ramp for entrance   Can travel by private vehicle     No  Equipment Recommendations  None recommended by PT    Recommendations for Other Services       Precautions / Restrictions Precautions Precautions: Fall Restrictions Weight Bearing Restrictions Per Provider Order: No     Mobility  Bed Mobility Overal bed mobility: Needs Assistance Bed Mobility: Supine to Sit     Supine to sit: Min assist, Mod assist     General bed mobility comments: increased  time with difficulty moving legs    Transfers Overall transfer level: Needs assistance Equipment used: Rolling walker (2 wheels) Transfers: Sit to/from Stand, Bed to chair/wheelchair/BSC Sit to Stand: Min assist, Mod assist   Step pivot transfers: Mod assist       General transfer comment: slow labored movement with most difficulty completing stand to sitting    Ambulation/Gait Ambulation/Gait assistance: Mod assist, Max assist Gait Distance (Feet): 5 Feet Assistive device: Rolling walker (2 wheels) Gait Pattern/deviations: Decreased step length - right, Decreased step length - left, Decreased stance time - right, Decreased stance time - left, Shuffle Gait velocity: slow     General Gait Details: limited to a few slow labored side steps with mostly suffling of feet due to weakness   Stairs             Wheelchair Mobility     Tilt Bed    Modified Rankin (Stroke Patients Only)       Balance Overall balance assessment: Needs assistance Sitting-balance support: Feet supported, No upper extremity supported Sitting balance-Leahy Scale: Fair Sitting balance - Comments: seated at EOB   Standing balance support: Reliant on assistive device for balance, During functional activity, Bilateral upper extremity supported Standing balance-Leahy Scale: Poor Standing balance comment: using RW                            Cognition Arousal: Alert Behavior During Therapy: WFL for tasks assessed/performed Overall Cognitive Status: Within Functional Limits for tasks assessed  Exercises General Exercises - Lower Extremity Ankle Circles/Pumps: AROM, Strengthening, Both, 10 reps, Seated Long Arc Quad: AROM, Strengthening, Both, 10 reps, Seated Hip Flexion/Marching: AROM, Strengthening, Both, 10 reps, Seated    General Comments        Pertinent Vitals/Pain Pain Assessment Pain Assessment: No/denies pain     Home Living                          Prior Function            PT Goals (current goals can now be found in the care plan section) Acute Rehab PT Goals Patient Stated Goal: To get stronger Time For Goal Achievement: 01/09/23 Potential to Achieve Goals: Good    Frequency    Min 2X/week      PT Plan      Co-evaluation              AM-PAC PT "6 Clicks" Mobility   Outcome Measure  Help needed turning from your back to your side while in a flat bed without using bedrails?: A Lot Help needed moving from lying on your back to sitting on the side of a flat bed without using bedrails?: A Lot Help needed moving to and from a bed to a chair (including a wheelchair)?: A Lot Help needed standing up from a chair using your arms (e.g., wheelchair or bedside chair)?: A Lot Help needed to walk in hospital room?: A Lot Help needed climbing 3-5 steps with a railing? : A Lot 6 Click Score: 12    End of Session   Activity Tolerance: Patient tolerated treatment well;Patient limited by fatigue Patient left: in chair;with call bell/phone within reach Nurse Communication: Mobility status PT Visit Diagnosis: Unsteadiness on feet (R26.81);Other abnormalities of gait and mobility (R26.89);Muscle weakness (generalized) (M62.81)     Time: 1610-9604 PT Time Calculation (min) (ACUTE ONLY): 23 min  Charges:    $Therapeutic Exercise: 8-22 mins $Therapeutic Activity: 8-22 mins PT General Charges $$ ACUTE PT VISIT: 1 Visit                     1:43 PM, 01/01/23 Ocie Bob, MPT Physical Therapist with [facility] [phone] office [phone] mobile phone

## 2023-01-01 NOTE — ED Notes (Signed)
Pt had small mucous yellow stool. Pt cleaned. Pt placed back in bed per request.

## 2023-01-01 NOTE — ED Notes (Signed)
Pt given water and linen changed. Pt wanted to take gown off. Pt denies any needs. Foley bag emptied.

## 2023-01-01 NOTE — ED Notes (Signed)
Pt resting. Alert and oriented to most. Denies pain. Pt in diaper only. Did not want a gown. Diaper clean. Foley cath in place and intact/draining. Abd distension with mild firmness noted but denies being tender. Edema noted to scrotal area and lower extremities with dark discoloration to ble. Barely able to feel pedal pulses, weak. Extremities warm. Upper airway congestion noted that went away with coughing and suctioning. Pt able to follow commands. Moving all extremities. Aware awaiting admission bed. Message sent to Dr Flossie Dibble about NPO status due to SLP eval and possible obstruction. Pt states he thinks he can get into a chair today.

## 2023-01-01 NOTE — Hospital Course (Addendum)
Terrance Miller is a 89 y.o. 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 Has been in ED since this time awaiting SNF placement, currently awaiting auth. Today noted to have borderline O2 sat, worsened resp status / breath sounds and evaluation revealed influenza, heart failure, AKI and called for admission. On interview he is confused and not able to provide significant information. He reports having a cough although not short of breath. Has scrotal swelling. Otherwise no other complaints.    Assessment/Plan: Influenza A, Viral pneumonia, Heart failure Acute respiratory failure Acute kidney injury Severe debility, deconditioning Hypertension Coronary artery disease, Diabetes type 2 Urinary retention with chronic Foley cath     Influenza A pneumonia Heart failure with preserved ejection fraction, with acute exacerbation Acute hypoxic respiratory failure secondary to above  -Seen and examined this morning, satting 93% on room air -POA: Desatted to 89% on room air, requiring 2 L, subsequently weaned off -  positive flu A,   - BNP 1097.  - Chest x-ray with bilateral interstitial opacities and a left retrocardiac opacity.  Suspected combined influenza A and heart failure leading to his respiratory failure.  Also at risk of aspiration.   - Continue Tamiflu 30 mg daily renally dosed x 5 days total - Lasix 40 mg IV x 1, scheduled for twice daily -will continue for now - TTE:    - Intake and output, daily weights - Albuterol neb every 4 hours as needed, incentive spirometry, flutter valve, encourage out of bed to chair. - SLP evaluation for oropharyngeal dysphagia   Acute kidney injury stage I History of CKD 3 with baseline creatinine approximately 1.5.   Elevated to 2.05    - Suspect this is cardiorenal with his volume overload. - Diuresis per above - Has a Foley catheter that is functioning well   Slight abd distension / hyperresonance  - XBJ:[id number]: Gaseous distention of stomach, small bowel, and colon most likely representing ileus but could indicate obstruction  -Continuing bowel regimen (Colace, Senokot, MiraLAX) -Clear liquid diet for now   Deconditioning - TOC arranging for SNF, currently accepted to [facility] but awaiting on auth    History of dementia -not any medications at home, no behavior disturbances  CAD with PCI: Continue his aspirin, rosuvastatin  Hypertension: Continue amlodipine  Diabetes: Blood glucose within goal range, can add SSI if runs high  Urinary retention with chronic indwelling Foley: Continue tamsulosin, continue routine Foley care

## 2023-01-01 NOTE — ED Notes (Signed)
Palliative in with pt and talking with 2 daughters who just showed up. PT just in with pt as well. See PT note

## 2023-01-01 NOTE — Evaluation (Signed)
Clinical/Bedside Swallow Evaluation Patient Details  Name: Terrance Miller MRN: [id number] Date of Birth: May 06, 1928  Today's Date: 01/01/2023 Time: SLP Start Time (ACUTE ONLY): 1440 SLP Stop Time (ACUTE ONLY): 1505 SLP Time Calculation (min) (ACUTE ONLY): 25 min  Past Medical History:  Past Medical History:  Diagnosis Date   Acute ST elevation myocardial infarction (STEMI) of inferior wall (HCC) 2004   Coronary artery disease    DES x2 to the RCA 2004, residual disease managed medically   Hyperlipidemia    Hypertension    Type 2 diabetes mellitus (HCC)    Past Surgical History:  Past Surgical History:  Procedure Laterality Date   BIOPSY  01/28/2022   Procedure: BIOPSY;  Surgeon: Marguerita Merles, Reuel Boom, MD;  Location: [facility];  Service: Gastroenterology;;   CATARACT EXTRACTION Bilateral    CORONARY ANGIOPLASTY WITH STENT PLACEMENT  09/05/2002   stent RCA, 80% first diagonal, 70-80% mid-diagonal, 80% mid LAD stenosis   FLEXIBLE SIGMOIDOSCOPY N/A 01/28/2022   Procedure: FLEXIBLE SIGMOIDOSCOPY;  Surgeon: Dolores Frame, MD;  Location: [facility];  Service: Gastroenterology;  Laterality: N/A;   HYDROCELE EXCISION Bilateral 11/20/2021   Procedure: HYDROCELECTOMY ADULT;  Surgeon: Milderd Meager., MD;  Location: [facility];  Service: Urology;  Laterality: Bilateral;   IMPACTION REMOVAL  01/28/2022   Procedure: IMPACTION REMOVAL;  Surgeon: Dolores Frame, MD;  Location: [facility];  Service: Gastroenterology;;   NM [facility] PERF WALL MOTION  11/01/2008   Normal   HPI:  Terrance Miller is a 89 y.o. male.  With history of CAD, MI, CKD, MGUS, chronic indwelling Foley type 2 diabetes who presents to the ED for generalized weakness.  Recently admitted for confusion and hallucinations and generalized weakness suspected to be due to UTI.  Was admitted on the 13th and discharged on the 15th.  Has been progressively weak and confused at home since the time of his  discharge.  Discharged with cefdinir x 5 days.  Patient himself has no systemic complaints at this time.  He is unable to provide additional history secondary to confusion. Chest xray with possible PNA, but also now has the FLU. BSE requested.    Assessment / Plan / Recommendation  Clinical Impression  Clinical swallow evaluation completed at bedside with family present, who indicates that Pt has never been known to previously have difficulty swallowing and consumed regular textures and thin liquids. Oral motor exam is unremarkable, except for mild wet vocal quality with reduced vocal intensity. Pt consumed ice chips, thins, NTL, puree, and graham crackers with mild wet vocal quality/audible congestion, however no overt coughing and Pt does now have confirmed flu. Recommed D3/mech soft and thin liquids and PO medications whole with water or applesauce and supervision for meals and SLP to follow during acute stay. Aspiration precautions and signs of aspiration reviewed with Pt and family. SLP Visit Diagnosis: Dysphagia, unspecified (R13.10)    Aspiration Risk  Mild aspiration risk;Risk for inadequate nutrition/hydration    Diet Recommendation Dysphagia 3 (Mech soft);Thin liquid    Liquid Administration via: Cup;Straw Medication Administration: Whole meds with liquid Supervision: Staff to assist with self feeding;Full supervision/cueing for compensatory strategies Compensations: Slow rate;Small sips/bites;Clear throat intermittently Postural Changes: Seated upright at 90 degrees;Remain upright for at least 30 minutes after po intake    Other  Recommendations Oral Care Recommendations: Oral care BID;Staff/trained caregiver to provide oral care    Recommendations for follow up therapy are one component of a multi-disciplinary discharge planning process, led by  the attending physician.  Recommendations may be updated based on patient status, additional functional criteria and insurance  authorization.  Follow up Recommendations Skilled nursing-short term rehab (<3 hours/day)      Assistance Recommended at Discharge    Functional Status Assessment Patient has had a recent decline in their functional status and demonstrates the ability to make significant improvements in function in a reasonable and predictable amount of time.  Frequency and Duration min 2x/week  1 week       Prognosis Prognosis for improved oropharyngeal function: Fair Barriers to Reach Goals: Cognitive deficits      Swallow Study   General Date of Onset: 12/26/22 HPI: Terrance Miller is a 89 y.o. male.  With history of CAD, MI, CKD, MGUS, chronic indwelling Foley type 2 diabetes who presents to the ED for generalized weakness.  Recently admitted for confusion and hallucinations and generalized weakness suspected to be due to UTI.  Was admitted on the 13th and discharged on the 15th.  Has been progressively weak and confused at home since the time of his discharge.  Discharged with cefdinir x 5 days.  Patient himself has no systemic complaints at this time.  He is unable to provide additional history secondary to confusion. Chest xray with possible PNA, but also now has the FLU. BSE requested. Type of Study: Bedside Swallow Evaluation Diet Prior to this Study:  (clear liquids) Temperature Spikes Noted: No Respiratory Status: Room air History of Recent Intubation: No Behavior/Cognition: Alert;Cooperative;Pleasant mood;Requires cueing Oral Cavity Assessment: Within Functional Limits Oral Care Completed by SLP: Yes Oral Cavity - Dentition: Poor condition;Missing dentition Vision: Functional for self-feeding Self-Feeding Abilities: Needs assist Patient Positioning: Upright in bed Baseline Vocal Quality: Normal;Low vocal intensity Volitional Cough: Weak Volitional Swallow: Able to elicit    Oral/Motor/Sensory Function Overall Oral Motor/Sensory Function: Within functional limits   Ice Chips Ice chips:  Within functional limits Presentation: Spoon   Thin Liquid Thin Liquid: Impaired Presentation: Cup;Straw Pharyngeal  Phase Impairments: Wet Vocal Quality (mild wet vocal quality which clears with cued cough)    Nectar Thick Nectar Thick Liquid: Impaired Pharyngeal Phase Impairments: Wet Vocal Quality (mild wet vocal quality clears with cued cough)   Honey Thick Honey Thick Liquid: Not tested   Puree Puree: Within functional limits Presentation: Spoon   Solid     Solid: Within functional limits     Thank you,  Havery Moros, CCC-SLP [phone]  Merion Caton 01/01/2023,3:12 PM

## 2023-01-02 ENCOUNTER — Inpatient Hospital Stay (HOSPITAL_COMMUNITY): Payer: Medicare Other

## 2023-01-02 DIAGNOSIS — I509 Heart failure, unspecified: Secondary | ICD-10-CM | POA: Diagnosis not present

## 2023-01-02 DIAGNOSIS — J101 Influenza due to other identified influenza virus with other respiratory manifestations: Secondary | ICD-10-CM | POA: Diagnosis not present

## 2023-01-02 LAB — BASIC METABOLIC PANEL
Anion gap: 8 (ref 5–15)
BUN: 42 mg/dL — ABNORMAL HIGH (ref 8–23)
CO2: 21 mmol/L — ABNORMAL LOW (ref 22–32)
Calcium: 8.1 mg/dL — ABNORMAL LOW (ref 8.9–10.3)
Chloride: 111 mmol/L (ref 98–111)
Creatinine, Ser: 2.16 mg/dL — ABNORMAL HIGH (ref 0.61–1.24)
GFR, Estimated: 28 mL/min — ABNORMAL LOW (ref >60)
Glucose, Bld: 83 mg/dL (ref 70–99)
Potassium: 3.5 mmol/L (ref 3.5–5.1)
Sodium: 140 mmol/L (ref 135–145)

## 2023-01-02 LAB — ECHOCARDIOGRAM COMPLETE
Area-P 1/2: 3.53 cm2
Calc EF: 56 %
Est EF: 50
Height: 66 in
S' Lateral: 3.7 cm
Single Plane A2C EF: 65.2 %
Single Plane A4C EF: 43.7 %
Weight: 3509.72 [oz_av]

## 2023-01-02 LAB — CBC
HCT: 25.1 % — ABNORMAL LOW (ref 39.0–52.0)
Hemoglobin: 8.5 g/dL — ABNORMAL LOW (ref 13.0–17.0)
MCH: 34.4 pg — ABNORMAL HIGH (ref 26.0–34.0)
MCHC: 33.9 g/dL (ref 30.0–36.0)
MCV: 101.6 fL — ABNORMAL HIGH (ref 80.0–100.0)
Platelets: 55 10*3/uL — ABNORMAL LOW (ref 150–400)
RBC: 2.47 MIL/uL — ABNORMAL LOW (ref 4.22–5.81)
RDW: 27.9 % — ABNORMAL HIGH (ref 11.5–15.5)
WBC: 3.6 10*3/uL — ABNORMAL LOW (ref 4.0–10.5)
nRBC: 0 % (ref 0.0–0.2)

## 2023-01-02 MED ORDER — PERFLUTREN LIPID MICROSPHERE
1.0000 mL | INTRAVENOUS | Status: AC | PRN
  Administered 2023-01-02: 1 mL via INTRAVENOUS

## 2023-01-02 MED ORDER — FUROSEMIDE 10 MG/ML IJ SOLN
40.0000 mg | Freq: Every day | INTRAMUSCULAR | Status: DC
  Administered 2023-01-03: 40 mg via INTRAVENOUS
  Filled 2023-01-02: qty 4

## 2023-01-02 NOTE — Progress Notes (Signed)
Physical Therapy Treatment Patient Details Name: Terrance Miller MRN: [id number] DOB: 03/24/1928 Today's Date: 01/02/2023   History of Present Illness Terrance Miller is a 89 y.o. male.  With history of CAD, MI, CKD, MGUS, chronic indwelling Foley type 2 diabetes who presents to the ED for generalized weakness.  Recently admitted for confusion and hallucinations and generalized weakness suspected to be due to UTI.  Was admitted on the 13th and discharged on the 15th.  Has been progressively weak and confused at home since the time of his discharge.  Discharged with cefdinir x 5 days.  Patient himself has no systemic complaints at this time.  He is unable to provide additional history secondary to confusion    PT Comments  Pt supine in bed and willing to participate with therapy today.  Presents with slow labored movements with therapist assistance needed with trunk rotation and LE to EOB due to weakness, use of handrails assisted to sitting.  Mod A with STS and cueing for handplacement as tendency to pull on RW vs push from chair.  Pt with bowel movement, pt stable standing while NT cleansed though was limited by fatigue with standing for a minute.  EOS pt left in chair with call bell within reach and chair alarm set.    If plan is discharge home, recommend the following:     Can travel by private vehicle        Equipment Recommendations       Recommendations for Other Services       Precautions / Restrictions Precautions Precautions: Fall Restrictions Weight Bearing Restrictions Per Provider Order: No     Mobility  Bed Mobility   Bed Mobility: Supine to Sit     Supine to sit: Mod assist     General bed mobility comments: increased time with difficulty moving legs, use of handrail to assist with rolling    Transfers Overall transfer level: Needs assistance Equipment used: Rolling walker (2 wheels) Transfers: Sit to/from Stand Sit to Stand: Mod assist           General  transfer comment: slow labored movement with most difficulty completing stand to sitting, cueing for handplacement    Ambulation/Gait Ambulation/Gait assistance: Mod assist, Max assist Gait Distance (Feet): 6 Feet Assistive device: Rolling walker (2 wheels) Gait Pattern/deviations: Decreased step length - right, Decreased step length - left, Decreased stance time - right, Decreased stance time - left, Shuffle Gait velocity: slow     General Gait Details: limited to a few slow labored side steps with mostly suffling of feet due to weakness   Stairs             Wheelchair Mobility     Tilt Bed    Modified Rankin (Stroke Patients Only)       Balance                                            Cognition Arousal: Alert Behavior During Therapy: WFL for tasks assessed/performed Overall Cognitive Status: Within Functional Limits for tasks assessed                                 General Comments: alert and agreeable to therapy        Exercises      General Comments  Pertinent Vitals/Pain Pain Assessment Pain Assessment: No/denies pain    Home Living                          Prior Function            PT Goals (current goals can now be found in the care plan section)      Frequency           PT Plan      Co-evaluation              AM-PAC PT "6 Clicks" Mobility   Outcome Measure  Help needed turning from your back to your side while in a flat bed without using bedrails?: A Lot Help needed moving from lying on your back to sitting on the side of a flat bed without using bedrails?: A Lot Help needed moving to and from a bed to a chair (including a wheelchair)?: A Lot Help needed standing up from a chair using your arms (e.g., wheelchair or bedside chair)?: A Lot Help needed to walk in hospital room?: A Lot Help needed climbing 3-5 steps with a railing? : A Lot 6 Click Score: 12    End of  Session Equipment Utilized During Treatment: Gait belt Activity Tolerance: Patient tolerated treatment well;Patient limited by fatigue Patient left: in chair;with call bell/phone within reach;with chair alarm set;with nursing/sitter in room Nurse Communication: Mobility status PT Visit Diagnosis: Unsteadiness on feet (R26.81);Other abnormalities of gait and mobility (R26.89);Muscle weakness (generalized) (M62.81)     Time: 6045-4098 PT Time Calculation (min) (ACUTE ONLY): 33 min  Charges:    $Therapeutic Activity: 23-37 mins PT General Charges $$ ACUTE PT VISIT: 1 Visit                     Becky Sax, LPTA/CLT; CBIS [phone]  Juel Burrow 01/02/2023, 10:55 AM

## 2023-01-02 NOTE — TOC Progression Note (Signed)
Transition of Care [facility]) - Progression Note    Patient Details  Name: Terrance Miller MRN: [id number] Date of Birth: Nov 13, 1928  Transition of [facility]) CM/SW Contact  Leitha Bleak, RN Phone Number: 01/02/2023, 3:34 PM  Clinical Narrative:  [name] has been approved 12/28 - 12/31 NRD 12/31 for [facility]. [name] updated.     Barriers to Discharge: ED SNF auth  Expected Discharge Plan and Services      Social Determinants of Health (SDOH) Interventions SDOH Screenings   Food Insecurity: No Food Insecurity (01/01/2023)  Housing: Low Risk  (01/01/2023)  Transportation Needs: No Transportation Needs (01/01/2023)  Utilities: Not At Risk (01/01/2023)  Alcohol Screen: Low Risk  (07/25/2022)  Depression (PHQ2-9): Low Risk  (08/25/2022)  Financial Resource Strain: Low Risk  (08/20/2022)  Physical Activity: Insufficiently Active (07/25/2022)  Social Connections: Socially Isolated (07/25/2022)  Stress: No Stress Concern Present (08/20/2022)  Tobacco Use: Medium Risk (01/01/2023)  Health Literacy: Adequate Health Literacy (07/25/2022)    Readmission Risk Interventions    12/20/2022   11:51 AM  Readmission Risk Prevention Plan  Transportation Screening Complete  HRI or Home Care Consult Complete  Social Work Consult for Recovery Care Planning/Counseling Complete  Palliative Care Screening Not Applicable  Medication Review Oceanographer) Complete

## 2023-01-02 NOTE — Progress Notes (Signed)
   01/02/23 1140  Spiritual Encounters  Type of Visit Initial  Care provided to: Pt not available  Reason for visit Routine spiritual support  OnCall Visit No   Chaplain attempted to visit with the patient, Terrance Miller, however he was receiving care at the time of my visit.   Valerie Roys [facility]  [phone]

## 2023-01-02 NOTE — Progress Notes (Signed)
[facility] PROGRESS NOTE  Patient: Terrance Miller                            PCP: [name], DO                    DOB: 1928/12/30            DOA: 12/26/2022 LKG:[id number]             DOS: 01/02/2023, 4:05 PM   LOS: 2 days   Date of Service: The patient was seen and examined on 01/02/2023  Subjective:   The patient was seen and examined this morning. No fever  Or chills   No Nausea, Vomiting or Diarrhea -Oral intake is fair =-Fatigue and weakness persist   Brief Narrative:   Terrance Miller is a 89 y.o. 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 Has been in ED since this time awaiting SNF placement, currently awaiting auth. Today noted to have borderline O2 sat, worsened resp status / breath sounds and evaluation revealed influenza, heart failure, AKI and called for admission. On interview he is confused and not able to provide significant information. He reports having a cough although not short of breath. Has scrotal swelling. Otherwise no other complaints.    Assessment/Plan: Influenza A, Viral pneumonia, Heart failure Acute respiratory failure Acute kidney injury Severe debility, deconditioning Hypertension Coronary artery disease, Diabetes type 2 Urinary retention with chronic Foley cath   1)Influenza A pneumonia -Respiratory symptoms improving -Continue bronchodilators and mucolytics -Okay to complete Tamiflu -Weaned off oxygen  2) acute on chronic diastolic dysfunction CHF exacerbation /HFpEF- BNP 1097--chest x-ray consistent with CHF -Repeat echo on 01/02/2023 with EF of 50%, without aortic stenosis 01/02/23 -Change Lasix to 40 mg daily from twice daily especially given resolution of hypoxia and worsening renal function Wt is 219 Lb -Check daily  weights, fluid input and output charting and Reds vest  3) acute hypoxic respiratory failure--due to #1 #2 above -Hypoxia improving with treatment of #1 #2 above - weaned down to room air  4)Dysphagia--speech pathologist eval appreciated recommends regular diet and thin liquids   5)AKI----acute kidney injury on CKD stage -3B -   creatinine on admission=1.50  ,  baseline creatinine =1.5 to 1.6    ,  --creatinine is now=2.1  ,  -renally adjust medications, avoid nephrotoxic agents / dehydration  / hypotension  -Patient has chronic indwelling Foley   6)Deconditioning/  Generalized weakness and ambulatory dysfunction - Physical therapy recommended SNF rehab - [facility] arranging for SNF, currently accepted to [facility] but awaiting on auth   7)History of dementia -obvious baseline cognitive and memory deficits noted  -not any medications at home, no behavior disturbances  8)CAD with PCI: Asymptomatic, continue his aspirin and rosuvastatin  9)HTN--   Continue amlodipine 2.5 mg daily  10)DM2-  Blood glucose within goal range, can add SSI if runs high =-A1c in 2023 was 6.3  11)H/o Recent CAUTI/chronic urinary retention--POA -Patient has chronic indwelling Foley catheter -Foley catheter previously changed 11/22/2022 -Foley catheter was changed on 12/19/2022 Recently treated with IV ceftriaxone , discharged on 12/21/2022 on Omnicef for 5 days -However urine culture from 12/19/2022 came back with MRSA/ VRE---not covered by either Rocephin or Omnicef--- suspect patient  Foley is colonized due to chronic indwelling Foley--no evidence of ongoing UTI per se -bilateral hydrocelectomy by Dr. Pete Glatter in 11/2021  -Outpatient urology follow-up -Continue Flomax  12)Morbid Obesity- -Low calorie diet, portion control and increase physical activity discussed with patient -Body mass index is 35.41  kg/m.  ----------------------------------------------------------------------------------------------------------------------------------------- Skin Assessment: As outlined belowe: Pressure Injury 01/28/22 Scrotum Left;Right;Posterior Stage 2 -  Partial thickness loss of dermis presenting as a shallow open injury with a red, pink wound bed without slough. Partial thickness (Active)  01/28/22 0040  Location: Scrotum  Location Orientation: Left;Right;Posterior  Staging: Stage 2 -  Partial thickness loss of dermis presenting as a shallow open injury with a red, pink wound bed without slough.  Wound Description (Comments): Partial thickness  Present on Admission: Yes     Pressure Injury 07/30/22 Scrotum Stage 2 -  Partial thickness loss of dermis presenting as a shallow open injury with a red, pink wound bed without slough. (Active)  07/30/22 0028  Location: Scrotum  Location Orientation:   Staging: Stage 2 -  Partial thickness loss of dermis presenting as a shallow open injury with a red, pink wound bed without slough.  Wound Description (Comments):   Present on Admission: Yes  Dressing Type None 01/01/23 2300     Pressure Injury 08/01/22 Buttocks Right;Left Stage 2 -  Partial thickness loss of dermis presenting as a shallow open injury with a red, pink wound bed without slough. small, shallow areas bilateral buttocks near rectum (Active)  08/01/22 0900  Location: Buttocks  Location Orientation: Right;Left  Staging: Stage 2 -  Partial thickness loss of dermis presenting as a shallow open injury with a red, pink wound bed without slough.  Wound Description (Comments): small, shallow areas bilateral buttocks near rectum  Present on Admission:    ----------------------------------------------------------------------------------------------------------------------------------------------------  Influenza A  positive ------------------------------------------------------------------------------------------------------------------------------------------------  DVT prophylaxis:  heparin injection 5,000 Units Start: 01/01/23 0600   Code Status:   Code Status: Limited: Do not attempt resuscitation (DNR) -DNR-LIMITED -Do Not Intubate/DNI   Family Communication: No family member present at bedside- -Advance care planning has been discussed.   Admission status:   Status is: Inpatient Remains inpatient appropriate because: Needing IV medications,   Disposition: From  - home             Planning for discharge to SNF [facility]  Procedures:   Antimicrobials:  Anti-infectives (From admission, onward)    Start     Dose/Rate Route Frequency Ordered Stop   01/01/23 1000  oseltamivir (TAMIFLU) capsule 30 mg        30 mg Oral Daily 12/31/22 2306 01/05/23 0959   12/31/22 2230  oseltamivir (TAMIFLU) capsule 30 mg        30 mg Oral  Once 12/31/22 2217 01/01/23 0002      Medication:   amLODipine  2.5 mg Oral Daily   aspirin EC  81 mg Oral Q breakfast   bisacodyl  10 mg Rectal Daily   Chlorhexidine Gluconate Cloth  6 each Topical Daily   feeding supplement  237 mL Oral BID BM   furosemide  40 mg Intravenous BID   heparin  5,000 Units Subcutaneous Q8H   latanoprost  1 drop Both Eyes BID   oseltamivir  30 mg Oral Daily   polyethylene glycol  17 g Oral BID   rosuvastatin  5 mg Oral Daily   senna  2 tablet Oral QHS   sodium chloride flush  3 mL Intravenous Q12H   tamsulosin  0.4 mg Oral Daily    acetaminophen, albuterol, bisacodyl   Objective:   Vitals:   01/01/23 2211 01/02/23 0217 01/02/23 0520 01/02/23 1245  BP: (!) 97/54 105/63  114/61  Pulse: 74 (!) 57  (!) 54  Resp: 16 17  19   Temp: 98.1 F (36.7 C) 98.6 F (37 C)  97.8 F (36.6 C)  TempSrc: Axillary Oral  Tympanic  SpO2: 98% 100%  98%  Weight:   99.5 kg   Height:        Intake/Output Summary (Last 24 hours) at  01/02/2023 1605 Last data filed at 01/02/2023 1317 Gross per 24 hour  Intake 360 ml  Output 1000 ml  Net -640 ml   Filed Weights   01/02/23 0520  Weight: 99.5 kg    Physical examination:   Physical Exam Gen:- Awake Alert, in no acute distress  HEENT:- [place].AT, No sclera icterus Neck-Supple Neck,No JVD,.  Lungs-improving air movement, no wheezing  CV- S1, S2 normal, RRR Abd-  +ve B.Sounds, Abd Soft, No tenderness,    Extremity/Skin:- No  edema,   good pedal pulses  Psych-affect is appropriate, , underlying cognitive and memory deficits consistent with dementia Neuro-generalized weakness, no new focal deficits, no tremors GU-chronic indwelling Foley  Wounds: Please see nursing documentation  Pressure Injury 01/28/22 Scrotum Left;Right;Posterior Stage 2 -  Partial thickness loss of dermis presenting as a shallow open injury with a red, pink wound bed without slough. Partial thickness (Active)  01/28/22 0040  Location: Scrotum  Location Orientation: Left;Right;Posterior  Staging: Stage 2 -  Partial thickness loss of dermis presenting as a shallow open injury with a red, pink wound bed without slough.  Wound Description (Comments): Partial thickness  Present on Admission: Yes     Pressure Injury 07/30/22 Scrotum Stage 2 -  Partial thickness loss of dermis presenting as a shallow open injury with a red, pink wound bed without slough. (Active)  07/30/22 0028  Location: Scrotum  Location Orientation:   Staging: Stage 2 -  Partial thickness loss of dermis presenting as a shallow open injury with a red, pink wound bed without slough.  Wound Description (Comments):   Present on Admission: Yes  Dressing Type None 01/01/23 2300     Pressure Injury 08/01/22 Buttocks Right;Left Stage 2 -  Partial thickness loss of dermis presenting as a shallow open injury with a red, pink wound bed without slough. small, shallow areas bilateral buttocks near rectum (Active)  08/01/22 0900  Location:  Buttocks  Location Orientation: Right;Left  Staging: Stage 2 -  Partial thickness loss of dermis presenting as a shallow open injury with a red, pink wound bed without slough.  Wound Description (Comments): small, shallow areas bilateral buttocks near rectum  Present on Admission:     LABs:     Latest Ref Rng & Units 01/02/2023    4:37 AM 01/01/2023    4:08 AM 12/31/2022    9:02 PM  CBC  WBC 4.0 - 10.5 K/uL 3.6  6.6  3.9   Hemoglobin 13.0 - 17.0 g/dL 8.5  9.1  8.4   Hematocrit 39.0 - 52.0 % 25.1  28.2  26.8   Platelets 150 - 400 K/uL 55  69  72       Latest Ref Rng & Units 01/02/2023    4:37 AM 01/01/2023    4:08 AM 12/31/2022    9:02 PM  CMP  Glucose 70 - 99 mg/dL 83  161  096   BUN 8 -  23 mg/dL 42  36  34   Creatinine 0.61 - 1.24 mg/dL 2.95  6.21  3.08   Sodium 135 - 145 mmol/L 140  142  142   Potassium 3.5 - 5.1 mmol/L 3.5  3.5  3.5   Chloride 98 - 111 mmol/L 111  110  111   CO2 22 - 32 mmol/L 21  22  21    Calcium 8.9 - 10.3 mg/dL 8.1  8.8  9.0    Micro Results Recent Results (from the past 240 hours)  Resp panel by RT-PCR (RSV, Flu A&B, Covid) Anterior Nasal Swab     Status: None   Collection Time: 12/26/22  7:17 PM   Specimen: Anterior Nasal Swab  Result Value Ref Range Status   SARS Coronavirus 2 by RT PCR NEGATIVE NEGATIVE Final    Comment: (NOTE) SARS-CoV-2 target nucleic acids are NOT DETECTED.  The SARS-CoV-2 RNA is generally detectable in upper respiratory specimens during the acute phase of infection. The lowest concentration of SARS-CoV-2 viral copies this assay can detect is 138 copies/mL. A negative result does not preclude SARS-Cov-2 infection and should not be used as the sole basis for treatment or other patient management decisions. A negative result may occur with  improper specimen collection/handling, submission of specimen other than nasopharyngeal swab, presence of viral mutation(s) within the areas targeted by this assay, and inadequate  number of viral copies(<138 copies/mL). A negative result must be combined with clinical observations, patient history, and epidemiological information. The expected result is Negative.  Fact Sheet for Patients:  BloggerCourse.com  Fact Sheet for Healthcare Providers:  SeriousBroker.it  This test is no t yet approved or cleared by the [place] FDA and  has been authorized for detection and/or diagnosis of SARS-CoV-2 by FDA under an Emergency Use Authorization (EUA). This EUA will remain  in effect (meaning this test can be used) for the duration of the COVID-19 declaration under Section 564(b)(1) of the Act, 21 U.S.C.section 360bbb-3(b)(1), unless the authorization is terminated  or revoked sooner.       Influenza A by PCR NEGATIVE NEGATIVE Final   Influenza B by PCR NEGATIVE NEGATIVE Final    Comment: (NOTE) The Xpert Xpress SARS-CoV-2/FLU/RSV plus assay is intended as an aid in the diagnosis of influenza from Nasopharyngeal swab specimens and should not be used as a sole basis for treatment. Nasal washings and aspirates are unacceptable for Xpert Xpress SARS-CoV-2/FLU/RSV testing.  Fact Sheet for Patients: BloggerCourse.com  Fact Sheet for Healthcare Providers: SeriousBroker.it  This test is not yet approved or cleared by the [place] FDA and has been authorized for detection and/or diagnosis of SARS-CoV-2 by FDA under an Emergency Use Authorization (EUA). This EUA will remain in effect (meaning this test can be used) for the duration of the COVID-19 declaration under Section 564(b)(1) of the Act, 21 U.S.C. section 360bbb-3(b)(1), unless the authorization is terminated or revoked.     Resp Syncytial Virus by PCR NEGATIVE NEGATIVE Final    Comment: (NOTE) Fact Sheet for Patients: BloggerCourse.com  Fact Sheet for Healthcare  Providers: SeriousBroker.it  This test is not yet approved or cleared by the [place] FDA and has been authorized for detection and/or diagnosis of SARS-CoV-2 by FDA under an Emergency Use Authorization (EUA). This EUA will remain in effect (meaning this test can be used) for the duration of the COVID-19 declaration under Section 564(b)(1) of the Act, 21 U.S.C. section 360bbb-3(b)(1), unless the authorization is terminated or revoked.  Performed at  [facility], [address] [place], [place] [address]   Resp panel by RT-PCR (RSV, Flu A&B, Covid) Anterior Nasal Swab     Status: Abnormal   Collection Time: 12/31/22  8:54 PM   Specimen: Anterior Nasal Swab  Result Value Ref Range Status   SARS Coronavirus 2 by RT PCR NEGATIVE NEGATIVE Final    Comment: (NOTE) SARS-CoV-2 target nucleic acids are NOT DETECTED.  The SARS-CoV-2 RNA is generally detectable in upper respiratory specimens during the acute phase of infection. The lowest concentration of SARS-CoV-2 viral copies this assay can detect is 138 copies/mL. A negative result does not preclude SARS-Cov-2 infection and should not be used as the sole basis for treatment or other patient management decisions. A negative result may occur with  improper specimen collection/handling, submission of specimen other than nasopharyngeal swab, presence of viral mutation(s) within the areas targeted by this assay, and inadequate number of viral copies(<138 copies/mL). A negative result must be combined with clinical observations, patient history, and epidemiological information. The expected result is Negative.  Fact Sheet for Patients:  BloggerCourse.com  Fact Sheet for Healthcare Providers:  SeriousBroker.it  This test is no t yet approved or cleared by the [place] FDA and  has been authorized for detection and/or diagnosis of SARS-CoV-2 by FDA under an  Emergency Use Authorization (EUA). This EUA will remain  in effect (meaning this test can be used) for the duration of the COVID-19 declaration under Section 564(b)(1) of the Act, 21 U.S.C.section 360bbb-3(b)(1), unless the authorization is terminated  or revoked sooner.       Influenza A by PCR POSITIVE (A) NEGATIVE Final   Influenza B by PCR NEGATIVE NEGATIVE Final    Comment: (NOTE) The Xpert Xpress SARS-CoV-2/FLU/RSV plus assay is intended as an aid in the diagnosis of influenza from Nasopharyngeal swab specimens and should not be used as a sole basis for treatment. Nasal washings and aspirates are unacceptable for Xpert Xpress SARS-CoV-2/FLU/RSV testing.  Fact Sheet for Patients: BloggerCourse.com  Fact Sheet for Healthcare Providers: SeriousBroker.it  This test is not yet approved or cleared by the [place] FDA and has been authorized for detection and/or diagnosis of SARS-CoV-2 by FDA under an Emergency Use Authorization (EUA). This EUA will remain in effect (meaning this test can be used) for the duration of the COVID-19 declaration under Section 564(b)(1) of the Act, 21 U.S.C. section 360bbb-3(b)(1), unless the authorization is terminated or revoked.     Resp Syncytial Virus by PCR NEGATIVE NEGATIVE Final    Comment: (NOTE) Fact Sheet for Patients: BloggerCourse.com  Fact Sheet for Healthcare Providers: SeriousBroker.it  This test is not yet approved or cleared by the [place] FDA and has been authorized for detection and/or diagnosis of SARS-CoV-2 by FDA under an Emergency Use Authorization (EUA). This EUA will remain in effect (meaning this test can be used) for the duration of the COVID-19 declaration under Section 564(b)(1) of the Act, 21 U.S.C. section 360bbb-3(b)(1), unless the authorization is terminated or revoked.  Performed at [facility], [address], [place], [place] [address]    Radiology Reports ECHOCARDIOGRAM COMPLETE Result Date: 01/02/2023    ECHOCARDIOGRAM REPORT   Patient Name:   Terrance Miller Date of Exam: 01/02/2023 Medical Rec #:  [id number]    Height:       66.0 in Accession #:    [id number]   Weight:       219.4 lb Date of Birth:  [birth year]   BSA:  2.080 m Patient Age:    89 years     BP:           105/63 mmHg Patient Gender: M            HR:           64 bpm. [facility] Procedure: 2D Echo, Cardiac Doppler, Color Doppler and Intracardiac            Opacification Agent Indications:    CHF I50.9  History:        Patient has prior history of Echocardiogram examinations, most                 recent 09/19/2021. CHF; Previous Myocardial Infarction and CAD.  Sonographer:    Webb Laws Referring Phys: [id number] JONATHAN SEGARS IMPRESSIONS  1. Left ventricular ejection fraction, by estimation, is 50%. The left ventricle has mildly decreased function. Left ventricular diastolic parameters are indeterminate.  2. Right ventricular systolic function is mildly reduced. The right ventricular size is mildly enlarged.  3. Trivial mitral valve regurgitation.  4. The aortic valve is tricuspid. Aortic valve regurgitation is not visualized. Aortic valve sclerosis/calcification is present, without any evidence of aortic stenosis.  5. The inferior vena cava is dilated in size with <50% respiratory variability, suggesting right atrial pressure of 15 mmHg. FINDINGS  Left Ventricle: Left ventricular ejection fraction, by estimation, is 50%. The left ventricle has mildly decreased function. The left ventricular internal cavity size was normal in size. There is no left ventricular hypertrophy. Left ventricular diastolic parameters are indeterminate. Right Ventricle: The right ventricular size is mildly enlarged. Right vetricular wall thickness was not assessed. Right ventricular systolic function is mildly reduced. Left Atrium:  Left atrial size was normal in size. Right Atrium: Right atrial size was normal in size. Pericardium: There is no evidence of pericardial effusion. Mitral Valve: Trivial mitral valve regurgitation. Tricuspid Valve: The tricuspid valve is normal in structure. Tricuspid valve regurgitation is mild. Aortic Valve: The aortic valve is tricuspid. Aortic valve regurgitation is not visualized. Aortic valve sclerosis/calcification is present, without any evidence of aortic stenosis. Pulmonic Valve: The pulmonic valve was not well visualized. Pulmonic valve regurgitation is mild. Aorta: The aortic root is normal in size and structure. Venous: The inferior vena cava is dilated in size with less than 50% respiratory variability, suggesting right atrial pressure of 15 mmHg. IAS/Shunts: No atrial level shunt detected by color flow Doppler.  LEFT VENTRICLE PLAX 2D LVIDd:         5.00 cm      Diastology LVIDs:         3.70 cm      LV e' medial:    6.74 cm/s LV PW:         0.90 cm      LV E/e' medial:  12.4 LV IVS:        1.00 cm      LV e' lateral:   8.92 cm/s LVOT diam:     2.10 cm      LV E/e' lateral: 9.4 LV SV:         73 LV SV Index:   35 LVOT Area:     3.46 cm  LV Volumes (MOD) LV vol d, MOD A2C: 106.0 ml LV vol d, MOD A4C: 104.0 ml LV vol s, MOD A2C: 36.9 ml LV vol s, MOD A4C: 58.6 ml LV SV MOD A2C:     69.1 ml LV SV MOD A4C:  104.0 ml LV SV MOD BP:      62.4 ml RIGHT VENTRICLE            IVC RV Basal diam:  3.60 cm    IVC diam: 2.50 cm RV S prime:     6.42 cm/s TAPSE (M-mode): 1.3 cm LEFT ATRIUM             Index        RIGHT ATRIUM           Index LA diam:        4.60 cm 2.21 cm/m   RA Area:     19.20 cm LA Vol (A2C):   56.1 ml 26.97 ml/m  RA Volume:   51.60 ml  24.81 ml/m LA Vol (A4C):   45.4 ml 21.83 ml/m LA Biplane Vol: 54.6 ml 26.25 ml/m  AORTIC VALVE LVOT Vmax:   106.00 cm/s LVOT Vmean:  68.200 cm/s LVOT VTI:    0.210 m  AORTA Ao Root diam: 3.40 cm MITRAL VALVE               TRICUSPID VALVE MV Area (PHT):  3.53 cm    TR Peak grad:   34.3 mmHg MV Decel Time: 215 msec    TR Vmax:        293.00 cm/s MV E velocity: 83.90 cm/s                            SHUNTS                            Systemic VTI:  0.21 m                            Systemic Diam: 2.10 cm Dietrich Pates MD Electronically signed by Dietrich Pates MD Signature Date/Time: 01/02/2023/1:55:19 PM    Final    SIGNED: Shon Hale, MD,   [facility],  Pager (please use [email or web address] to page/ text) Please use Epic Secure Chat for non-urgent communication (7AM-7PM)  If 7PM-7AM, please contact night-coverage [email or web address], 01/02/2023, 4:05 PM

## 2023-01-02 NOTE — Plan of Care (Signed)
  Problem: Education: Goal: Knowledge of General Education information will improve Description: Including pain rating scale, medication(s)/side effects and non-pharmacologic comfort measures Outcome: Not Progressing   Problem: Health Behavior/Discharge Planning: Goal: Ability to manage health-related needs will improve Outcome: Not Progressing   Problem: Clinical Measurements: Goal: Ability to maintain clinical measurements within normal limits will improve Outcome: Progressing Goal: Will remain free from infection Outcome: Progressing Goal: Diagnostic test results will improve Outcome: Progressing Goal: Respiratory complications will improve Outcome: Progressing Goal: Cardiovascular complication will be avoided Outcome: Progressing   Problem: Activity: Goal: Risk for activity intolerance will decrease Outcome: Not Progressing   Problem: Nutrition: Goal: Adequate nutrition will be maintained Outcome: Not Progressing   Problem: Coping: Goal: Level of anxiety will decrease Outcome: Progressing   Problem: Elimination: Goal: Will not experience complications related to bowel motility Outcome: Progressing Goal: Will not experience complications related to urinary retention Outcome: Progressing   Problem: Pain Management: Goal: General experience of comfort will improve Outcome: Progressing   Problem: Safety: Goal: Ability to remain free from injury will improve Outcome: Progressing   Problem: Skin Integrity: Goal: Risk for impaired skin integrity will decrease Outcome: Progressing

## 2023-01-02 NOTE — Progress Notes (Signed)
Speech Language Pathology Treatment: Dysphagia  Patient Details Name: Terrance Miller MRN: [id number] DOB: July 06, 1928 Today's Date: 01/02/2023 Time: 4132-4401 SLP Time Calculation (min) (ACUTE ONLY): 26 min  Assessment / Plan / Recommendation Clinical Impression  Ongoing diagnostic dysphagia therapy provided today; Pt was sitting upright in chair and using spit cup to expectorate yellow foamy phlegm upon SLP arrival. Pt consumed ~ 200ccs of thin liquids without overt s/sx of oropharyngeal dysphagia. No wet vocal quality observed by this SLP today. Note Pt is on clear liquid diet and was only assessed with liquids this date. From oropharyngeal standpoint recommend continue with D3/mech soft and thin liquids. Meds ok whole with liquids or applesauce. Recommend supervision for meals. ST will continue to follow acutely, thank you,   HPI HPI: Terrance Miller is a 89 y.o. male.  With history of CAD, MI, CKD, MGUS, chronic indwelling Foley type 2 diabetes who presents to the ED for generalized weakness.  Recently admitted for confusion and hallucinations and generalized weakness suspected to be due to UTI.  Was admitted on the 13th and discharged on the 15th.  Has been progressively weak and confused at home since the time of his discharge.  Discharged with cefdinir x 5 days.  Patient himself has no systemic complaints at this time.  He is unable to provide additional history secondary to confusion. Chest xray with possible PNA, but also now has the FLU. BSE requested.      SLP Plan  Continue with current plan of care      Recommendations for follow up therapy are one component of a multi-disciplinary discharge planning process, led by the attending physician.  Recommendations may be updated based on patient status, additional functional criteria and insurance authorization.    Recommendations  Diet recommendations: Regular;Thin liquid Liquids provided via: Cup;Straw Medication Administration: Whole  meds with liquid Supervision: Patient able to self feed Compensations: Slow rate;Small sips/bites;Clear throat intermittently                  Oral care BID;Staff/trained caregiver to provide oral care   None Dysphagia, unspecified (R13.10)     Continue with current plan of care    Kim Lauver H. Romie Levee, CCC-SLP Speech Language Pathologist  Georgetta Haber  01/02/2023, 11:15 AM

## 2023-01-03 DIAGNOSIS — J101 Influenza due to other identified influenza virus with other respiratory manifestations: Secondary | ICD-10-CM | POA: Diagnosis not present

## 2023-01-03 LAB — CBC
HCT: 25.1 % — ABNORMAL LOW (ref 39.0–52.0)
Hemoglobin: 8.4 g/dL — ABNORMAL LOW (ref 13.0–17.0)
MCH: 34 pg (ref 26.0–34.0)
MCHC: 33.5 g/dL (ref 30.0–36.0)
MCV: 101.6 fL — ABNORMAL HIGH (ref 80.0–100.0)
Platelets: 54 10*3/uL — ABNORMAL LOW (ref 150–400)
RBC: 2.47 MIL/uL — ABNORMAL LOW (ref 4.22–5.81)
RDW: 28 % — ABNORMAL HIGH (ref 11.5–15.5)
WBC: 3 10*3/uL — ABNORMAL LOW (ref 4.0–10.5)
nRBC: 0 % (ref 0.0–0.2)

## 2023-01-03 LAB — BASIC METABOLIC PANEL
Anion gap: 8 (ref 5–15)
BUN: 43 mg/dL — ABNORMAL HIGH (ref 8–23)
CO2: 22 mmol/L (ref 22–32)
Calcium: 8.1 mg/dL — ABNORMAL LOW (ref 8.9–10.3)
Chloride: 110 mmol/L (ref 98–111)
Creatinine, Ser: 2.23 mg/dL — ABNORMAL HIGH (ref 0.61–1.24)
GFR, Estimated: 27 mL/min — ABNORMAL LOW (ref >60)
Glucose, Bld: 83 mg/dL (ref 70–99)
Potassium: 3.3 mmol/L — ABNORMAL LOW (ref 3.5–5.1)
Sodium: 140 mmol/L (ref 135–145)

## 2023-01-03 MED ORDER — POTASSIUM CHLORIDE CRYS ER 20 MEQ PO TBCR
40.0000 meq | EXTENDED_RELEASE_TABLET | ORAL | Status: AC
  Administered 2023-01-03 (×2): 40 meq via ORAL
  Filled 2023-01-03: qty 2

## 2023-01-03 NOTE — Progress Notes (Signed)
Terrance Miller Kitchen PROGRESS NOTE  Patient: Terrance Miller                            PCP: [name], DO                    DOB: 02/18/1928            DOA: 12/26/2022 EPP:[id number]             DOS: 01/03/2023, 5:51 PM   LOS: 3 days   Date of Service: The patient was seen and examined on 01/03/2023  Subjective:   The patient was seen and examined this morning. -Generalized weakness and fatigue persist - voiding well -No further hypoxia -Uptrending creatinine noted - Disposition---hold discharge to SNF at this time due to uptrending creatinine noted, watch renal function with diuretics   Brief Narrative:   Terrance Miller is a 89 y.o. 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 Has been in ED since this time awaiting SNF placement, currently awaiting auth. Today noted to have borderline O2 sat, worsened resp status / breath sounds and evaluation revealed influenza, heart failure, AKI and called for admission. On interview he is confused and not able to provide significant information. He reports having a cough although not short of breath. Has scrotal swelling. Otherwise no other complaints.    Assessment/Plan: Influenza A, Viral pneumonia, Heart failure Acute respiratory failure Acute kidney injury Severe debility, deconditioning Hypertension Coronary artery disease, Diabetes type 2 Urinary retention with chronic Foley cath   1)Influenza A pneumonia -Respiratory symptoms improving -Continue bronchodilators and mucolytics -Okay to complete Tamiflu -Weaned off oxygen  2) acute on chronic diastolic dysfunction CHF exacerbation /HFpEF- BNP 1097--chest x-ray consistent with CHF -Repeat echo on 01/02/2023 with EF of 50%, without aortic stenosis 01/03/23 -Change Lasix to 40 mg  daily from twice daily especially given resolution of hypoxia and worsening renal function Wt is 219 >>211 lb---Wt trending down with diuresis -Check daily weights, fluid input and output charting and Reds vest  3)Acute Hypoxic Respiratory Failure--due to #1 #2 above -Hypoxia resolved with treatment of #1 #2 above - weaned down to room air  4)Dysphagia--speech pathologist eval appreciated recommends regular diet and thin liquids   5)AKI----acute kidney injury on CKD stage -3B -   creatinine on admission=1.50  ,  baseline creatinine =1.5 to 1.6    ,  --creatinine is now=2.2   (Uptrending creatinine noted) -renally adjust medications, avoid nephrotoxic agents / dehydration  / hypotension  -Patient has chronic indwelling Foley   6)Deconditioning/  Generalized weakness and ambulatory dysfunction - Physical therapy recommended SNF rehab - [facility] arranging for SNF, currently accepted to [facility] but awaiting on auth   7)History of dementia -obvious baseline cognitive and memory deficits noted  -not any medications at home, no behavior disturbances  8)CAD with PCI: Asymptomatic, continue his aspirin and rosuvastatin  9)HTN--   Continue Amlodipine 2.5 mg daily  10)DM2-  Blood glucose within goal range, can add SSI if runs high =-A1c in 2023 was 6.3  11)H/o Recent CAUTI/chronic urinary retention--POA -Patient has chronic indwelling Foley catheter -Foley catheter previously changed 11/22/2022 -Foley catheter was changed on 12/19/2022 Recently treated with IV ceftriaxone , discharged on 12/21/2022 on Omnicef for 5 days -  However urine culture from 12/19/2022 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 Pete Glatter in 11/2021  -Outpatient urology follow-up -Continue Flomax  12)Hypokalemia--- due to diuretics, replace  kcl  13)Morbid Obesity- -Low calorie diet, portion control and increase physical activity discussed with patient -Body mass index is 34.16 kg/m.  Disposition---hold discharge to SNF at this time due to uptrending creatinine noted, watch renal function with diuretics ----------------------------------------------------------------------------------------------------------------------------------------- Skin Assessment: As outlined belowe: Pressure Injury 01/28/22 Scrotum Left;Right;Posterior Stage 2 -  Partial thickness loss of dermis presenting as a shallow open injury with a red, pink wound bed without slough. Partial thickness (Active)  01/28/22 0040  Location: Scrotum  Location Orientation: Left;Right;Posterior  Staging: Stage 2 -  Partial thickness loss of dermis presenting as a shallow open injury with a red, pink wound bed without slough.  Wound Description (Comments): Partial thickness  Present on Admission: Yes     Pressure Injury 07/30/22 Scrotum Stage 2 -  Partial thickness loss of dermis presenting as a shallow open injury with a red, pink wound bed without slough. (Active)  07/30/22 0028  Location: Scrotum  Location Orientation:   Staging: Stage 2 -  Partial thickness loss of dermis presenting as a shallow open injury with a red, pink wound bed without slough.  Wound Description (Comments):   Present on Admission: Yes  Dressing Type None 01/03/23 0816     Pressure Injury 08/01/22 Buttocks Right;Left Stage 2 -  Partial thickness loss of dermis presenting as a shallow open injury with a red, pink wound bed without slough. small, shallow areas bilateral buttocks near rectum (Active)  08/01/22 0900  Location: Buttocks  Location Orientation: Right;Left  Staging: Stage 2 -  Partial thickness loss of dermis presenting as a shallow open injury with a red, pink wound bed without slough.  Wound Description (Comments): small, shallow areas bilateral buttocks near rectum  Present on  Admission:    ------------------------------------------------------------------------------------------------------------------ Influenza A positive ------------------------------------------------------------------------------------------------------------------------------------------------  DVT prophylaxis:  heparin injection 5,000 Units Start: 01/01/23 0600   Code Status:   Code Status: Limited: Do not attempt resuscitation (DNR) -DNR-LIMITED -Do Not Intubate/DNI   Family Communication: No family member present at bedside- -Advance care planning has been discussed.   Admission status:   Status is: Inpatient  Disposition: From  - home             Planning for discharge to SNF [facility]  Procedures:   Antimicrobials:  Anti-infectives (From admission, onward)    Start     Dose/Rate Route Frequency Ordered Stop   01/01/23 1000  oseltamivir (TAMIFLU) capsule 30 mg        30 mg Oral Daily 12/31/22 2306 01/05/23 0959   12/31/22 2230  oseltamivir (TAMIFLU) capsule 30 mg        30 mg Oral  Once 12/31/22 2217 01/01/23 0002      Medication:   amLODipine  2.5 mg Oral Daily   aspirin EC  81 mg Oral Q breakfast   bisacodyl  10 mg Rectal Daily   Chlorhexidine Gluconate Cloth  6 each Topical Daily   feeding supplement  237 mL Oral BID BM   heparin  5,000 Units Subcutaneous Q8H   latanoprost  1 drop Both Eyes BID   oseltamivir  30 mg Oral Daily   polyethylene glycol  17 g Oral BID   rosuvastatin  5 mg Oral Daily   senna  2 tablet Oral QHS   sodium chloride flush  3 mL Intravenous Q12H   tamsulosin  0.4 mg Oral Daily   acetaminophen, albuterol, bisacodyl   Objective:   Vitals:   01/02/23 2122 01/03/23 0409 01/03/23 0452 01/03/23 1409  BP: (!) 113/51 122/61  111/63  Pulse: (!) 51 62  (!) 54  Resp: 20 20  17   Temp: (!) 97.5 F (36.4 C) 98.9 F (37.2 C)  97.8 F (36.6 C)  TempSrc: Oral Oral  Oral  SpO2: 94% 96%  98%  Weight:   96 kg   Height:         Intake/Output Summary (Last 24 hours) at 01/03/2023 1751 Last data filed at 01/03/2023 1500 Gross per 24 hour  Intake 600 ml  Output 2350 ml  Net -1750 ml   Filed Weights   01/02/23 0520 01/03/23 0452  Weight: 99.5 kg 96 kg    Physical examination:   Physical Exam Gen:- Awake Alert, in no acute distress  HEENT:- [place].AT, No sclera icterus Neck-Supple Neck,No JVD,.  Lungs-improving air movement, no wheezing  CV- S1, S2 normal, RRR Abd-  +ve B.Sounds, Abd Soft, No tenderness,    Extremity/Skin:- No  edema,   good pedal pulses  Psych-affect is appropriate, , underlying cognitive and memory deficits consistent with dementia Neuro-generalized weakness, no new focal deficits, no tremors GU-chronic indwelling Foley Wounds: Please see nursing documentation  Pressure Injury 01/28/22 Scrotum Left;Right;Posterior Stage 2 -  Partial thickness loss of dermis presenting as a shallow open injury with a red, pink wound bed without slough. Partial thickness (Active)  01/28/22 0040  Location: Scrotum  Location Orientation: Left;Right;Posterior  Staging: Stage 2 -  Partial thickness loss of dermis presenting as a shallow open injury with a red, pink wound bed without slough.  Wound Description (Comments): Partial thickness  Present on Admission: Yes     Pressure Injury 07/30/22 Scrotum Stage 2 -  Partial thickness loss of dermis presenting as a shallow open injury with a red, pink wound bed without slough. (Active)  07/30/22 0028  Location: Scrotum  Location Orientation:   Staging: Stage 2 -  Partial thickness loss of dermis presenting as a shallow open injury with a red, pink wound bed without slough.  Wound Description (Comments):   Present on Admission: Yes  Dressing Type None 01/03/23 0816     Pressure Injury 08/01/22 Buttocks Right;Left Stage 2 -  Partial thickness loss of dermis presenting as a shallow open injury with a red, pink wound bed without slough. small, shallow areas  bilateral buttocks near rectum (Active)  08/01/22 0900  Location: Buttocks  Location Orientation: Right;Left  Staging: Stage 2 -  Partial thickness loss of dermis presenting as a shallow open injury with a red, pink wound bed without slough.  Wound Description (Comments): small, shallow areas bilateral buttocks near rectum  Present on Admission:     LABs:     Latest Ref Rng & Units 01/03/2023    4:17 AM 01/02/2023    4:37 AM 01/01/2023    4:08 AM  CBC  WBC 4.0 - 10.5 K/uL 3.0  3.6  6.6   Hemoglobin 13.0 - 17.0 g/dL 8.4  8.5  9.1   Hematocrit 39.0 - 52.0 % 25.1  25.1  28.2   Platelets 150 - 400 K/uL 54  55  69       Latest Ref Rng & Units 01/03/2023    4:17 AM 01/02/2023  4:37 AM 01/01/2023    4:08 AM  CMP  Glucose 70 - 99 mg/dL 83  83  010   BUN 8 - 23 mg/dL 43  42  36   Creatinine 0.61 - 1.24 mg/dL 2.72  5.36  6.44   Sodium 135 - 145 mmol/L 140  140  142   Potassium 3.5 - 5.1 mmol/L 3.3  3.5  3.5   Chloride 98 - 111 mmol/L 110  111  110   CO2 22 - 32 mmol/L 22  21  22    Calcium 8.9 - 10.3 mg/dL 8.1  8.1  8.8    Micro Results Recent Results (from the past 240 hours)  Resp panel by RT-PCR (RSV, Flu A&B, Covid) Anterior Nasal Swab     Status: None   Collection Time: 12/26/22  7:17 PM   Specimen: Anterior Nasal Swab  Result Value Ref Range Status   SARS Coronavirus 2 by RT PCR NEGATIVE NEGATIVE Final    Comment: (NOTE) SARS-CoV-2 target nucleic acids are NOT DETECTED.  The SARS-CoV-2 RNA is generally detectable in upper respiratory specimens during the acute phase of infection. The lowest concentration of SARS-CoV-2 viral copies this assay can detect is 138 copies/mL. A negative result does not preclude SARS-Cov-2 infection and should not be used as the sole basis for treatment or other patient management decisions. A negative result may occur with  improper specimen collection/handling, submission of specimen other than nasopharyngeal swab, presence of viral  mutation(s) within the areas targeted by this assay, and inadequate number of viral copies(<138 copies/mL). A negative result must be combined with clinical observations, patient history, and epidemiological information. The expected result is Negative.  Fact Sheet for Patients:  BloggerCourse.com  Fact Sheet for Healthcare Providers:  SeriousBroker.it  This test is no t yet approved or cleared by the [place] FDA and  has been authorized for detection and/or diagnosis of SARS-CoV-2 by FDA under an Emergency Use Authorization (EUA). This EUA will remain  in effect (meaning this test can be used) for the duration of the COVID-19 declaration under Section 564(b)(1) of the Act, 21 U.S.C.section 360bbb-3(b)(1), unless the authorization is terminated  or revoked sooner.       Influenza A by PCR NEGATIVE NEGATIVE Final   Influenza B by PCR NEGATIVE NEGATIVE Final    Comment: (NOTE) The Xpert Xpress SARS-CoV-2/FLU/RSV plus assay is intended as an aid in the diagnosis of influenza from Nasopharyngeal swab specimens and should not be used as a sole basis for treatment. Nasal washings and aspirates are unacceptable for Xpert Xpress SARS-CoV-2/FLU/RSV testing.  Fact Sheet for Patients: BloggerCourse.com  Fact Sheet for Healthcare Providers: SeriousBroker.it  This test is not yet approved or cleared by the [place] FDA and has been authorized for detection and/or diagnosis of SARS-CoV-2 by FDA under an Emergency Use Authorization (EUA). This EUA will remain in effect (meaning this test can be used) for the duration of the COVID-19 declaration under Section 564(b)(1) of the Act, 21 U.S.C. section 360bbb-3(b)(1), unless the authorization is terminated or revoked.     Resp Syncytial Virus by PCR NEGATIVE NEGATIVE Final    Comment: (NOTE) Fact Sheet for  Patients: BloggerCourse.com  Fact Sheet for Healthcare Providers: SeriousBroker.it  This test is not yet approved or cleared by the [place] FDA and has been authorized for detection and/or diagnosis of SARS-CoV-2 by FDA under an Emergency Use Authorization (EUA). This EUA will remain in effect (meaning this test can be used) for  the duration of the COVID-19 declaration under Section 564(b)(1) of the Act, 21 U.S.C. section 360bbb-3(b)(1), unless the authorization is terminated or revoked.  Performed at [facility], [address], [place], [place] [address]   Resp panel by RT-PCR (RSV, Flu A&B, Covid) Anterior Nasal Swab     Status: Abnormal   Collection Time: 12/31/22  8:54 PM   Specimen: Anterior Nasal Swab  Result Value Ref Range Status   SARS Coronavirus 2 by RT PCR NEGATIVE NEGATIVE Final    Comment: (NOTE) SARS-CoV-2 target nucleic acids are NOT DETECTED.  The SARS-CoV-2 RNA is generally detectable in upper respiratory specimens during the acute phase of infection. The lowest concentration of SARS-CoV-2 viral copies this assay can detect is 138 copies/mL. A negative result does not preclude SARS-Cov-2 infection and should not be used as the sole basis for treatment or other patient management decisions. A negative result may occur with  improper specimen collection/handling, submission of specimen other than nasopharyngeal swab, presence of viral mutation(s) within the areas targeted by this assay, and inadequate number of viral copies(<138 copies/mL). A negative result must be combined with clinical observations, patient history, and epidemiological information. The expected result is Negative.  Fact Sheet for Patients:  BloggerCourse.com  Fact Sheet for Healthcare Providers:  SeriousBroker.it  This test is no t yet approved or cleared by the [place] FDA and   has been authorized for detection and/or diagnosis of SARS-CoV-2 by FDA under an Emergency Use Authorization (EUA). This EUA will remain  in effect (meaning this test can be used) for the duration of the COVID-19 declaration under Section 564(b)(1) of the Act, 21 U.S.C.section 360bbb-3(b)(1), unless the authorization is terminated  or revoked sooner.       Influenza A by PCR POSITIVE (A) NEGATIVE Final   Influenza B by PCR NEGATIVE NEGATIVE Final    Comment: (NOTE) The Xpert Xpress SARS-CoV-2/FLU/RSV plus assay is intended as an aid in the diagnosis of influenza from Nasopharyngeal swab specimens and should not be used as a sole basis for treatment. Nasal washings and aspirates are unacceptable for Xpert Xpress SARS-CoV-2/FLU/RSV testing.  Fact Sheet for Patients: BloggerCourse.com  Fact Sheet for Healthcare Providers: SeriousBroker.it  This test is not yet approved or cleared by the [place] FDA and has been authorized for detection and/or diagnosis of SARS-CoV-2 by FDA under an Emergency Use Authorization (EUA). This EUA will remain in effect (meaning this test can be used) for the duration of the COVID-19 declaration under Section 564(b)(1) of the Act, 21 U.S.C. section 360bbb-3(b)(1), unless the authorization is terminated or revoked.     Resp Syncytial Virus by PCR NEGATIVE NEGATIVE Final    Comment: (NOTE) Fact Sheet for Patients: BloggerCourse.com  Fact Sheet for Healthcare Providers: SeriousBroker.it  This test is not yet approved or cleared by the [place] FDA and has been authorized for detection and/or diagnosis of SARS-CoV-2 by FDA under an Emergency Use Authorization (EUA). This EUA will remain in effect (meaning this test can be used) for the duration of the COVID-19 declaration under Section 564(b)(1) of the Act, 21 U.S.C. section 360bbb-3(b)(1),  unless the authorization is terminated or revoked.  Performed at [facility], [address] [place], [place] [address]    SIGNED: Shon Hale, MD,   [facility],  Pager (please use [email or web address] to page/ text) Please use Epic Secure Chat for non-urgent communication (7AM-7PM)  If 7PM-7AM, please contact night-coverage [email or web address], 01/03/2023, 5:51 PM

## 2023-01-03 NOTE — Progress Notes (Addendum)
Physical Therapy Treatment Patient Details Name: Terrance Miller MRN: [id number] DOB: 04-Apr-1928 Today's Date: 01/03/2023   History of Present Illness Terrance Miller is a 89 y.o. male.  With history of CAD, MI, CKD, MGUS, chronic indwelling Foley type 2 diabetes who presents to the ED for generalized weakness.  Recently admitted for confusion and hallucinations and generalized weakness suspected to be due to UTI.  Was admitted on the 13th and discharged on the 15th.  Has been progressively weak and confused at home since the time of his discharge.  Discharged with cefdinir x 5 days.  Patient himself has no systemic complaints at this time.  He is unable to provide additional history secondary to confusion    PT Comments  Patient had most difficulty moving BLE during bed mobility, able to stand and tolerated completing a few standing partial squats up to 10 reps before requesting to sit due to c/o BLE weakness and able to complete BLE exercises while seated in chair.  Patient tolerated sitting up in chair after therapy - nursing staff aware.  Patient will benefit from continued skilled physical therapy in hospital and recommended venue below to increase strength, balance, endurance for safe ADLs and gait.     If plan is discharge home, recommend the following: A lot of help with walking and/or transfers;A lot of help with bathing/dressing/bathroom;Assistance with cooking/housework;Help with stairs or ramp for entrance   Can travel by private vehicle     No  Equipment Recommendations  None recommended by PT    Recommendations for Other Services       Precautions / Restrictions Precautions Precautions: Fall Restrictions Weight Bearing Restrictions Per Provider Order: No     Mobility  Bed Mobility Overal bed mobility: Needs Assistance Bed Mobility: Supine to Sit     Supine to sit: Mod assist     General bed mobility comments: slow labored movement, had to use bed rail due to weakness     Transfers Overall transfer level: Needs assistance Equipment used: Rolling walker (2 wheels) Transfers: Sit to/from Stand, Bed to chair/wheelchair/BSC Sit to Stand: Mod assist   Step pivot transfers: Mod assist       General transfer comment: unsteady labored movement    Ambulation/Gait Ambulation/Gait assistance: Mod assist, Max assist Gait Distance (Feet): 5 Feet Assistive device: Rolling walker (2 wheels) Gait Pattern/deviations: Decreased step length - right, Decreased step length - left, Decreased stance time - right, Decreased stance time - left, Shuffle Gait velocity: slow     General Gait Details: able to take a few side steps with slow labored movement, limited mostly due to BLE weakness   Stairs             Wheelchair Mobility     Tilt Bed    Modified Rankin (Stroke Patients Only)       Balance Overall balance assessment: Needs assistance Sitting-balance support: Feet supported, No upper extremity supported Sitting balance-Leahy Scale: Fair Sitting balance - Comments: fair/good seated at EOB   Standing balance support: Reliant on assistive device for balance, During functional activity, Bilateral upper extremity supported Standing balance-Leahy Scale: Poor Standing balance comment: fair/poor using RW                            Cognition Arousal: Alert Behavior During Therapy: WFL for tasks assessed/performed Overall Cognitive Status: Within Functional Limits for tasks assessed  Exercises General Exercises - Lower Extremity Ankle Circles/Pumps: AROM, Strengthening, Both, 10 reps, Seated Long Arc Quad: AROM, Strengthening, Both, 10 reps, Seated Hip Flexion/Marching: AROM, Strengthening, Both, 10 reps, Seated Mini-Sqauts: Standing, AAROM, Strengthening, Both, 10 reps    General Comments        Pertinent Vitals/Pain Pain Assessment Pain Assessment: Faces Faces Pain  Scale: Hurts a little bit Pain Location: BLE with movement Pain Descriptors / Indicators: Discomfort Pain Intervention(s): Limited activity within patient's tolerance, Monitored during session, Repositioned    Home Living                          Prior Function            PT Goals (current goals can now be found in the care plan section) Acute Rehab PT Goals Patient Stated Goal: To get stronger PT Goal Formulation: With patient Time For Goal Achievement: 01/09/23 Potential to Achieve Goals: Good Progress towards PT goals: Progressing toward goals    Frequency    Min 3X/week      PT Plan      Co-evaluation              AM-PAC PT "6 Clicks" Mobility   Outcome Measure  Help needed turning from your back to your side while in a flat bed without using bedrails?: A Lot Help needed moving from lying on your back to sitting on the side of a flat bed without using bedrails?: A Lot Help needed moving to and from a bed to a chair (including a wheelchair)?: A Lot Help needed standing up from a chair using your arms (e.g., wheelchair or bedside chair)?: A Lot Help needed to walk in hospital room?: A Lot Help needed climbing 3-5 steps with a railing? : Total 6 Click Score: 11    End of Session   Activity Tolerance: Patient tolerated treatment well;Patient limited by fatigue Patient left: in chair;with call bell/phone within reach Nurse Communication: Mobility status PT Visit Diagnosis: Unsteadiness on feet (R26.81);Other abnormalities of gait and mobility (R26.89);Muscle weakness (generalized) (M62.81)     Time: 1308-6578 PT Time Calculation (min) (ACUTE ONLY): 20 min  Charges:    $Therapeutic Exercise: 8-22 mins $Therapeutic Activity: 8-22 mins PT General Charges $$ ACUTE PT VISIT: 1 Visit                     1:57 PM, 01/03/23 Ocie Bob, MPT Physical Therapist with [facility] [phone] office [phone] mobile phone

## 2023-01-03 NOTE — Plan of Care (Signed)
  Problem: Education: Goal: Knowledge of General Education information will improve Description: Including pain rating scale, medication(s)/side effects and non-pharmacologic comfort measures Outcome: Not Progressing   Problem: Health Behavior/Discharge Planning: Goal: Ability to manage health-related needs will improve Outcome: Not Progressing   Problem: Clinical Measurements: Goal: Ability to maintain clinical measurements within normal limits will improve Outcome: Progressing Goal: Will remain free from infection Outcome: Progressing Goal: Diagnostic test results will improve Outcome: Progressing Goal: Respiratory complications will improve Outcome: Progressing Goal: Cardiovascular complication will be avoided Outcome: Progressing   Problem: Activity: Goal: Risk for activity intolerance will decrease Outcome: Not Progressing   Problem: Nutrition: Goal: Adequate nutrition will be maintained Outcome: Progressing   Problem: Coping: Goal: Level of anxiety will decrease Outcome: Progressing   Problem: Elimination: Goal: Will not experience complications related to bowel motility Outcome: Progressing Goal: Will not experience complications related to urinary retention Outcome: Progressing   Problem: Pain Management: Goal: General experience of comfort will improve Outcome: Progressing   Problem: Safety: Goal: Ability to remain free from injury will improve Outcome: Progressing   Problem: Skin Integrity: Goal: Risk for impaired skin integrity will decrease Outcome: Progressing

## 2023-01-04 ENCOUNTER — Inpatient Hospital Stay (HOSPITAL_COMMUNITY): Payer: Medicare Other

## 2023-01-04 DIAGNOSIS — J101 Influenza due to other identified influenza virus with other respiratory manifestations: Secondary | ICD-10-CM | POA: Diagnosis not present

## 2023-01-04 LAB — CBC
HCT: 25.9 % — ABNORMAL LOW (ref 39.0–52.0)
Hemoglobin: 8.5 g/dL — ABNORMAL LOW (ref 13.0–17.0)
MCH: 33.7 pg (ref 26.0–34.0)
MCHC: 32.8 g/dL (ref 30.0–36.0)
MCV: 102.8 fL — ABNORMAL HIGH (ref 80.0–100.0)
Platelets: 54 10*3/uL — ABNORMAL LOW (ref 150–400)
RBC: 2.52 MIL/uL — ABNORMAL LOW (ref 4.22–5.81)
RDW: 27.9 % — ABNORMAL HIGH (ref 11.5–15.5)
WBC: 2.4 10*3/uL — ABNORMAL LOW (ref 4.0–10.5)
nRBC: 0.8 % — ABNORMAL HIGH (ref 0.0–0.2)

## 2023-01-04 LAB — BASIC METABOLIC PANEL
Anion gap: 10 (ref 5–15)
BUN: 42 mg/dL — ABNORMAL HIGH (ref 8–23)
CO2: 22 mmol/L (ref 22–32)
Calcium: 8 mg/dL — ABNORMAL LOW (ref 8.9–10.3)
Chloride: 109 mmol/L (ref 98–111)
Creatinine, Ser: 2.44 mg/dL — ABNORMAL HIGH (ref 0.61–1.24)
GFR, Estimated: 24 mL/min — ABNORMAL LOW (ref >60)
Glucose, Bld: 125 mg/dL — ABNORMAL HIGH (ref 70–99)
Potassium: 3.8 mmol/L (ref 3.5–5.1)
Sodium: 141 mmol/L (ref 135–145)

## 2023-01-04 MED ORDER — DOXYCYCLINE HYCLATE 100 MG PO TABS
100.0000 mg | ORAL_TABLET | Freq: Two times a day (BID) | ORAL | Status: DC
  Administered 2023-01-04 – 2023-01-05 (×2): 100 mg via ORAL
  Filled 2023-01-04 (×2): qty 1

## 2023-01-04 MED ORDER — DM-GUAIFENESIN ER 30-600 MG PO TB12
1.0000 | ORAL_TABLET | Freq: Two times a day (BID) | ORAL | Status: DC
  Administered 2023-01-04 – 2023-01-05 (×2): 1 via ORAL
  Filled 2023-01-04 (×2): qty 1

## 2023-01-04 MED ORDER — SODIUM CHLORIDE 0.9 % IV SOLN: INTRAVENOUS | Status: AC

## 2023-01-04 MED ORDER — IPRATROPIUM-ALBUTEROL 0.5-2.5 (3) MG/3ML IN SOLN
RESPIRATORY_TRACT | Status: AC
  Administered 2023-01-04: 3 mL
  Filled 2023-01-04: qty 3

## 2023-01-04 MED ORDER — SODIUM CHLORIDE 0.9 % IV SOLN
2.0000 g | INTRAVENOUS | Status: DC
Start: 2023-01-04 — End: 2023-01-05
  Administered 2023-01-04: 2 g via INTRAVENOUS
  Filled 2023-01-04: qty 20

## 2023-01-04 MED ORDER — AZITHROMYCIN 250 MG PO TABS
500.0000 mg | ORAL_TABLET | Freq: Every day | ORAL | Status: DC
Start: 2023-01-04 — End: 2023-01-04
  Administered 2023-01-04: 500 mg via ORAL
  Filled 2023-01-04: qty 2

## 2023-01-04 MED ORDER — ALBUTEROL SULFATE (2.5 MG/3ML) 0.083% IN NEBU
2.5000 mg | INHALATION_SOLUTION | RESPIRATORY_TRACT | Status: DC | PRN
  Administered 2023-01-04: 2.5 mg via RESPIRATORY_TRACT
  Filled 2023-01-04: qty 3

## 2023-01-04 MED ORDER — IPRATROPIUM-ALBUTEROL 0.5-2.5 (3) MG/3ML IN SOLN
3.0000 mL | Freq: Four times a day (QID) | RESPIRATORY_TRACT | Status: DC
  Administered 2023-01-05 (×3): 3 mL via RESPIRATORY_TRACT
  Filled 2023-01-04 (×3): qty 3

## 2023-01-04 NOTE — Progress Notes (Signed)
[facility] PROGRESS NOTE  Patient: Terrance Miller                            PCP: [name], DO                    DOB: 06-23-1928            DOA: 12/26/2022 QMV:[id number]             DOS: 01/04/2023, 6:46 PM   LOS: 4 days   Date of Service: The patient was seen and examined on 01/04/2023  Subjective:   The patient was seen and examined this morning. -Generalized weakness and fatigue persist - voiding well -Patient has cough, wheezing at times and dyspnea -No further hypoxia -Uptrending creatinine noted - - Patient's son at bedside, questions answered  Disposition---hold discharge to SNF at this time due to uptrending creatinine as well as worsening cough and dyspnea ,     Brief Narrative:   Aldie Soisson is a 89 y.o. 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 Has been in ED since this time awaiting SNF placement, currently awaiting auth. Today noted to have borderline O2 sat, worsened resp status / breath sounds and evaluation revealed influenza, heart failure, AKI and called for admission.   Assessment/Plan: Influenza A, Viral pneumonia, Heart failure Acute respiratory failure Acute kidney injury Severe debility, deconditioning Hypertension Coronary artery disease, Diabetes type 2 Urinary retention with chronic Foley cath   1)Influenza A pneumonia -Respiratory symptoms improving -Continue bronchodilators and mucolytics -Completed Tamiflu -Weaned off oxygen -Repeat chest x-ray on 01/04/2023 with left basilar patchy opacity cannot exclude aspiration pneumonia or post influenza pneumonia-- --Rocephin/doxycycline-started  2) acute on chronic diastolic dysfunction CHF exacerbation /HFpEF- BNP 1097--chest x-ray consistent with CHF -Repeat echo on 01/02/2023  with EF of 50%, without aortic stenosis 01/04/23 -Lasix was discontinued given resolution of hypoxia and worsening renal function Wt is 219 >>211 lb>> 211 -Check daily weights, fluid input and output charting and Reds vest  3)Acute Hypoxic Respiratory Failure--due to #1 #2 above -Hypoxia resolved with treatment of #1 #2 above - weaned down to room air  4)Dysphagia--speech pathologist eval appreciated recommends regular diet and thin liquids   5)AKI----acute kidney injury on CKD stage -3B -   creatinine on admission=1.50  ,  baseline creatinine =1.5 to 1.6    ,  --creatinine is now=2.4   (Uptrending creatinine noted) -renally adjust medications, avoid nephrotoxic agents / dehydration  / hypotension  -Patient has chronic indwelling Foley   6)Deconditioning/  Generalized weakness and ambulatory dysfunction - Physical therapy recommended SNF rehab - TOC arranging for SNF, currently accepted to [facility] but awaiting on auth   7)History of dementia -obvious baseline cognitive and memory deficits noted  -not any medications at home, no behavior disturbances  8)CAD with PCI: Asymptomatic, continue his aspirin and rosuvastatin  9)HTN--   Continue Amlodipine 2.5 mg daily  10)DM2-  Blood glucose within goal range, can add SSI if runs high =-A1c in 2023 was 6.3  11)H/o Recent CAUTI/chronic urinary retention--POA -Patient has chronic indwelling Foley catheter -Foley catheter previously changed 11/22/2022 -Foley catheter was changed on 12/19/2022 Recently treated with IV ceftriaxone , discharged on 12/21/2022 on Omnicef for 5 days -However urine culture  from 12/19/2022 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 Pete Glatter in 11/2021  -Outpatient urology follow-up -Continue Flomax  12)Worsening Pancytopenia--- in the  setting of influenza infection with possible pneumonia -WBC is down to 2.4, hemoglobin is down to 8.5, platelets are down to 54 -At baseline patient has a history of chronic anemia and chronic thrombocytopenia  13)Morbid Obesity- -Low calorie diet, portion control and increase physical activity discussed with patient -Body mass index is 34.16 kg/m.  Disposition---hold discharge to SNF at this time due to uptrending creatinine as well as worsening cough and dyspnea ,   ----------------------------------------------------------------------------------------------------------------------------------------- Skin Assessment: As outlined belowe: Pressure Injury 01/28/22 Scrotum Left;Right;Posterior Stage 2 -  Partial thickness loss of dermis presenting as a shallow open injury with a red, pink wound bed without slough. Partial thickness (Active)  01/28/22 0040  Location: Scrotum  Location Orientation: Left;Right;Posterior  Staging: Stage 2 -  Partial thickness loss of dermis presenting as a shallow open injury with a red, pink wound bed without slough.  Wound Description (Comments): Partial thickness  Present on Admission: Yes     Pressure Injury 07/30/22 Scrotum Stage 2 -  Partial thickness loss of dermis presenting as a shallow open injury with a red, pink wound bed without slough. (Active)  07/30/22 0028  Location: Scrotum  Location Orientation:   Staging: Stage 2 -  Partial thickness loss of dermis presenting as a shallow open injury with a red, pink wound bed without slough.  Wound Description (Comments):   Present on Admission: Yes  Dressing Type Foam - Lift dressing to assess site every shift 01/04/23 0931     Pressure Injury 08/01/22 Buttocks Right;Left Stage 2 -  Partial thickness loss of dermis presenting as a shallow open injury with a red, pink wound bed without slough. small, shallow areas bilateral buttocks near rectum (Active)  08/01/22 0900  Location: Buttocks  Location  Orientation: Right;Left  Staging: Stage 2 -  Partial thickness loss of dermis presenting as a shallow open injury with a red, pink wound bed without slough.  Wound Description (Comments): small, shallow areas bilateral buttocks near rectum  Present on Admission:    ------------------------------------------------------------------------------------------------------------------ Influenza A positive ------------------------------------------------------------------------------------------------------------------------------------------------  DVT prophylaxis:  heparin injection 5,000 Units Start: 01/01/23 0600   Code Status:   Code Status: Limited: Do not attempt resuscitation (DNR) -DNR-LIMITED -Do Not Intubate/DNI   Family Communication:  Discussed with Son at bedside- -Advance care planning has been discussed.   Admission status:   Status is: Inpatient  Disposition: From  - home             Planning for discharge to SNF [facility]  Procedures:   Antimicrobials:  Anti-infectives (From admission, onward)    Start     Dose/Rate Route Frequency Ordered Stop   01/04/23 1715  cefTRIAXone (ROCEPHIN) 2 g in sodium chloride 0.9 % 100 mL IVPB        2 g 200 mL/hr over 30 Minutes Intravenous Every 24 hours 01/04/23 1629     01/04/23 1715  azithromycin (ZITHROMAX) tablet 500 mg        500 mg Oral Daily 01/04/23 1629     01/01/23 1000  oseltamivir (TAMIFLU) capsule 30 mg        30 mg Oral Daily 12/31/22 2306 01/04/23 0926   12/31/22 2230  oseltamivir (TAMIFLU) capsule 30 mg  30 mg Oral  Once 12/31/22 2217 01/01/23 0002      Medication:   amLODipine  2.5 mg Oral Daily   aspirin EC  81 mg Oral Q breakfast   azithromycin  500 mg Oral Daily   bisacodyl  10 mg Rectal Daily   Chlorhexidine Gluconate Cloth  6 each Topical Daily   dextromethorphan-guaiFENesin  1 tablet Oral BID   feeding supplement  237 mL Oral BID BM   heparin  5,000 Units Subcutaneous Q8H   latanoprost  1  drop Both Eyes BID   polyethylene glycol  17 g Oral BID   rosuvastatin  5 mg Oral Daily   senna  2 tablet Oral QHS   sodium chloride flush  3 mL Intravenous Q12H   tamsulosin  0.4 mg Oral Daily   acetaminophen, albuterol, bisacodyl   Objective:   Vitals:   01/04/23 0458 01/04/23 0922 01/04/23 1249 01/04/23 1418  BP:  136/74  (!) 149/93  Pulse:  (!) 57  (!) 55  Resp:  16  20  Temp:  (!) 97.5 F (36.4 C)  98.4 F (36.9 C)  TempSrc:  Axillary  Oral  SpO2:  94% 93% 96%  Weight: 96 kg     Height:        Intake/Output Summary (Last 24 hours) at 01/04/2023 1846 Last data filed at 01/04/2023 1738 Gross per 24 hour  Intake 720 ml  Output 500 ml  Net 220 ml   Filed Weights   01/02/23 0520 01/03/23 0452 01/04/23 0458  Weight: 99.5 kg 96 kg 96 kg    Physical examination:   Physical Exam Gen:- Awake Alert, in no acute distress  HEENT:- .AT, No sclera icterus Neck-Supple Neck,No JVD,.  Lungs-improving air movement, no wheezing  CV- S1, S2 normal, RRR Abd-  +ve B.Sounds, Abd Soft, No tenderness,    Extremity/Skin:- No  edema,   good pedal pulses  Psych-affect is appropriate, , underlying cognitive and memory deficits consistent with dementia Neuro-generalized weakness, no new focal deficits, no tremors GU-chronic indwelling Foley Wounds: Please see nursing documentation  Pressure Injury 01/28/22 Scrotum Left;Right;Posterior Stage 2 -  Partial thickness loss of dermis presenting as a shallow open injury with a red, pink wound bed without slough. Partial thickness (Active)  01/28/22 0040  Location: Scrotum  Location Orientation: Left;Right;Posterior  Staging: Stage 2 -  Partial thickness loss of dermis presenting as a shallow open injury with a red, pink wound bed without slough.  Wound Description (Comments): Partial thickness  Present on Admission: Yes     Pressure Injury 07/30/22 Scrotum Stage 2 -  Partial thickness loss of dermis presenting as a shallow open injury  with a red, pink wound bed without slough. (Active)  07/30/22 0028  Location: Scrotum  Location Orientation:   Staging: Stage 2 -  Partial thickness loss of dermis presenting as a shallow open injury with a red, pink wound bed without slough.  Wound Description (Comments):   Present on Admission: Yes  Dressing Type Foam - Lift dressing to assess site every shift 01/04/23 0931     Pressure Injury 08/01/22 Buttocks Right;Left Stage 2 -  Partial thickness loss of dermis presenting as a shallow open injury with a red, pink wound bed without slough. small, shallow areas bilateral buttocks near rectum (Active)  08/01/22 0900  Location: Buttocks  Location Orientation: Right;Left  Staging: Stage 2 -  Partial thickness loss of dermis presenting as a shallow open injury with a red, pink wound bed  without slough.  Wound Description (Comments): small, shallow areas bilateral buttocks near rectum  Present on Admission:     LABs:     Latest Ref Rng & Units 01/04/2023    4:10 AM 01/03/2023    4:17 AM 01/02/2023    4:37 AM  CBC  WBC 4.0 - 10.5 K/uL 2.4  3.0  3.6   Hemoglobin 13.0 - 17.0 g/dL 8.5  8.4  8.5   Hematocrit 39.0 - 52.0 % 25.9  25.1  25.1   Platelets 150 - 400 K/uL 54  54  55       Latest Ref Rng & Units 01/04/2023    4:10 AM 01/03/2023    4:17 AM 01/02/2023    4:37 AM  CMP  Glucose 70 - 99 mg/dL 409  83  83   BUN 8 - 23 mg/dL 42  43  42   Creatinine 0.61 - 1.24 mg/dL 8.11  9.14  7.82   Sodium 135 - 145 mmol/L 141  140  140   Potassium 3.5 - 5.1 mmol/L 3.8  3.3  3.5   Chloride 98 - 111 mmol/L 109  110  111   CO2 22 - 32 mmol/L 22  22  21    Calcium 8.9 - 10.3 mg/dL 8.0  8.1  8.1    Micro Results Recent Results (from the past 240 hours)  Resp panel by RT-PCR (RSV, Flu A&B, Covid) Anterior Nasal Swab     Status: None   Collection Time: 12/26/22  7:17 PM   Specimen: Anterior Nasal Swab  Result Value Ref Range Status   SARS Coronavirus 2 by RT PCR NEGATIVE NEGATIVE Final     Comment: (NOTE) SARS-CoV-2 target nucleic acids are NOT DETECTED.  The SARS-CoV-2 RNA is generally detectable in upper respiratory specimens during the acute phase of infection. The lowest concentration of SARS-CoV-2 viral copies this assay can detect is 138 copies/mL. A negative result does not preclude SARS-Cov-2 infection and should not be used as the sole basis for treatment or other patient management decisions. A negative result may occur with  improper specimen collection/handling, submission of specimen other than nasopharyngeal swab, presence of viral mutation(s) within the areas targeted by this assay, and inadequate number of viral copies(<138 copies/mL). A negative result must be combined with clinical observations, patient history, and epidemiological information. The expected result is Negative.  Fact Sheet for Patients:  BloggerCourse.com  Fact Sheet for Healthcare Providers:  SeriousBroker.it  This test is no t yet approved or cleared by the [place] FDA and  has been authorized for detection and/or diagnosis of SARS-CoV-2 by FDA under an Emergency Use Authorization (EUA). This EUA will remain  in effect (meaning this test can be used) for the duration of the COVID-19 declaration under Section 564(b)(1) of the Act, 21 U.S.C.section 360bbb-3(b)(1), unless the authorization is terminated  or revoked sooner.       Influenza A by PCR NEGATIVE NEGATIVE Final   Influenza B by PCR NEGATIVE NEGATIVE Final    Comment: (NOTE) The Xpert Xpress SARS-CoV-2/FLU/RSV plus assay is intended as an aid in the diagnosis of influenza from Nasopharyngeal swab specimens and should not be used as a sole basis for treatment. Nasal washings and aspirates are unacceptable for Xpert Xpress SARS-CoV-2/FLU/RSV testing.  Fact Sheet for Patients: BloggerCourse.com  Fact Sheet for Healthcare  Providers: SeriousBroker.it  This test is not yet approved or cleared by the [place] FDA and has been authorized for detection and/or diagnosis of  SARS-CoV-2 by FDA under an Emergency Use Authorization (EUA). This EUA will remain in effect (meaning this test can be used) for the duration of the COVID-19 declaration under Section 564(b)(1) of the Act, 21 U.S.C. section 360bbb-3(b)(1), unless the authorization is terminated or revoked.     Resp Syncytial Virus by PCR NEGATIVE NEGATIVE Final    Comment: (NOTE) Fact Sheet for Patients: BloggerCourse.com  Fact Sheet for Healthcare Providers: SeriousBroker.it  This test is not yet approved or cleared by the [place] FDA and has been authorized for detection and/or diagnosis of SARS-CoV-2 by FDA under an Emergency Use Authorization (EUA). This EUA will remain in effect (meaning this test can be used) for the duration of the COVID-19 declaration under Section 564(b)(1) of the Act, 21 U.S.C. section 360bbb-3(b)(1), unless the authorization is terminated or revoked.  Performed at [facility], [address], [place], [place] [address]   Resp panel by RT-PCR (RSV, Flu A&B, Covid) Anterior Nasal Swab     Status: Abnormal   Collection Time: 12/31/22  8:54 PM   Specimen: Anterior Nasal Swab  Result Value Ref Range Status   SARS Coronavirus 2 by RT PCR NEGATIVE NEGATIVE Final    Comment: (NOTE) SARS-CoV-2 target nucleic acids are NOT DETECTED.  The SARS-CoV-2 RNA is generally detectable in upper respiratory specimens during the acute phase of infection. The lowest concentration of SARS-CoV-2 viral copies this assay can detect is 138 copies/mL. A negative result does not preclude SARS-Cov-2 infection and should not be used as the sole basis for treatment or other patient management decisions. A negative result may occur with  improper specimen  collection/handling, submission of specimen other than nasopharyngeal swab, presence of viral mutation(s) within the areas targeted by this assay, and inadequate number of viral copies(<138 copies/mL). A negative result must be combined with clinical observations, patient history, and epidemiological information. The expected result is Negative.  Fact Sheet for Patients:  BloggerCourse.com  Fact Sheet for Healthcare Providers:  SeriousBroker.it  This test is no t yet approved or cleared by the [place] FDA and  has been authorized for detection and/or diagnosis of SARS-CoV-2 by FDA under an Emergency Use Authorization (EUA). This EUA will remain  in effect (meaning this test can be used) for the duration of the COVID-19 declaration under Section 564(b)(1) of the Act, 21 U.S.C.section 360bbb-3(b)(1), unless the authorization is terminated  or revoked sooner.       Influenza A by PCR POSITIVE (A) NEGATIVE Final   Influenza B by PCR NEGATIVE NEGATIVE Final    Comment: (NOTE) The Xpert Xpress SARS-CoV-2/FLU/RSV plus assay is intended as an aid in the diagnosis of influenza from Nasopharyngeal swab specimens and should not be used as a sole basis for treatment. Nasal washings and aspirates are unacceptable for Xpert Xpress SARS-CoV-2/FLU/RSV testing.  Fact Sheet for Patients: BloggerCourse.com  Fact Sheet for Healthcare Providers: SeriousBroker.it  This test is not yet approved or cleared by the [place] FDA and has been authorized for detection and/or diagnosis of SARS-CoV-2 by FDA under an Emergency Use Authorization (EUA). This EUA will remain in effect (meaning this test can be used) for the duration of the COVID-19 declaration under Section 564(b)(1) of the Act, 21 U.S.C. section 360bbb-3(b)(1), unless the authorization is terminated or revoked.     Resp Syncytial  Virus by PCR NEGATIVE NEGATIVE Final    Comment: (NOTE) Fact Sheet for Patients: BloggerCourse.com  Fact Sheet for Healthcare Providers: SeriousBroker.it  This test is not yet  approved or cleared by the [place] and has been authorized for detection and/or diagnosis of SARS-CoV-2 by FDA under an Emergency Use Authorization (EUA). This EUA will remain in effect (meaning this test can be used) for the duration of the COVID-19 declaration under Section 564(b)(1) of the Act, 21 U.S.C. section 360bbb-3(b)(1), unless the authorization is terminated or revoked.  Performed at [facility], [address], [place], [place] [address]    SIGNED: Shon Hale, MD,   [facility],  Pager (please use [email or web address] to page/ text) Please use Epic Secure Chat for non-urgent communication (7AM-7PM)  If 7PM-7AM, please contact night-coverage [email or web address], 01/04/2023, 6:46 PM

## 2023-01-05 ENCOUNTER — Ambulatory Visit: Payer: Self-pay | Admitting: *Deleted

## 2023-01-05 ENCOUNTER — Encounter: Payer: Self-pay | Admitting: *Deleted

## 2023-01-05 ENCOUNTER — Other Ambulatory Visit: Payer: Medicare Other | Admitting: *Deleted

## 2023-01-05 DIAGNOSIS — R29898 Other symptoms and signs involving the musculoskeletal system: Secondary | ICD-10-CM | POA: Diagnosis not present

## 2023-01-05 DIAGNOSIS — M6281 Muscle weakness (generalized): Secondary | ICD-10-CM | POA: Diagnosis not present

## 2023-01-05 DIAGNOSIS — H409 Unspecified glaucoma: Secondary | ICD-10-CM | POA: Diagnosis not present

## 2023-01-05 DIAGNOSIS — R404 Transient alteration of awareness: Secondary | ICD-10-CM | POA: Diagnosis not present

## 2023-01-05 DIAGNOSIS — E11628 Type 2 diabetes mellitus with other skin complications: Secondary | ICD-10-CM | POA: Diagnosis not present

## 2023-01-05 DIAGNOSIS — I69891 Dysphagia following other cerebrovascular disease: Secondary | ICD-10-CM | POA: Diagnosis not present

## 2023-01-05 DIAGNOSIS — R339 Retention of urine, unspecified: Secondary | ICD-10-CM | POA: Diagnosis not present

## 2023-01-05 DIAGNOSIS — I10 Essential (primary) hypertension: Secondary | ICD-10-CM | POA: Diagnosis not present

## 2023-01-05 DIAGNOSIS — I2510 Atherosclerotic heart disease of native coronary artery without angina pectoris: Secondary | ICD-10-CM | POA: Diagnosis not present

## 2023-01-05 DIAGNOSIS — D691 Qualitative platelet defects: Secondary | ICD-10-CM | POA: Diagnosis not present

## 2023-01-05 DIAGNOSIS — J101 Influenza due to other identified influenza virus with other respiratory manifestations: Secondary | ICD-10-CM | POA: Diagnosis not present

## 2023-01-05 DIAGNOSIS — L89152 Pressure ulcer of sacral region, stage 2: Secondary | ICD-10-CM | POA: Diagnosis not present

## 2023-01-05 DIAGNOSIS — D472 Monoclonal gammopathy: Secondary | ICD-10-CM | POA: Diagnosis not present

## 2023-01-05 DIAGNOSIS — Z7401 Bed confinement status: Secondary | ICD-10-CM | POA: Diagnosis not present

## 2023-01-05 DIAGNOSIS — R7309 Other abnormal glucose: Secondary | ICD-10-CM | POA: Diagnosis not present

## 2023-01-05 DIAGNOSIS — R1312 Dysphagia, oropharyngeal phase: Secondary | ICD-10-CM | POA: Diagnosis not present

## 2023-01-05 DIAGNOSIS — D649 Anemia, unspecified: Secondary | ICD-10-CM | POA: Diagnosis not present

## 2023-01-05 DIAGNOSIS — L89153 Pressure ulcer of sacral region, stage 3: Secondary | ICD-10-CM | POA: Diagnosis not present

## 2023-01-05 DIAGNOSIS — R278 Other lack of coordination: Secondary | ICD-10-CM | POA: Diagnosis not present

## 2023-01-05 DIAGNOSIS — N183 Chronic kidney disease, stage 3 unspecified: Secondary | ICD-10-CM | POA: Diagnosis not present

## 2023-01-05 DIAGNOSIS — E785 Hyperlipidemia, unspecified: Secondary | ICD-10-CM | POA: Diagnosis not present

## 2023-01-05 LAB — BASIC METABOLIC PANEL
Anion gap: 8 (ref 5–15)
BUN: 38 mg/dL — ABNORMAL HIGH (ref 8–23)
CO2: 22 mmol/L (ref 22–32)
Calcium: 8.2 mg/dL — ABNORMAL LOW (ref 8.9–10.3)
Chloride: 112 mmol/L — ABNORMAL HIGH (ref 98–111)
Creatinine, Ser: 2.14 mg/dL — ABNORMAL HIGH (ref 0.61–1.24)
GFR, Estimated: 28 mL/min — ABNORMAL LOW (ref >60)
Glucose, Bld: 101 mg/dL — ABNORMAL HIGH (ref 70–99)
Potassium: 4.1 mmol/L (ref 3.5–5.1)
Sodium: 142 mmol/L (ref 135–145)

## 2023-01-05 LAB — CBC
HCT: 26.7 % — ABNORMAL LOW (ref 39.0–52.0)
Hemoglobin: 8.9 g/dL — ABNORMAL LOW (ref 13.0–17.0)
MCH: 34.2 pg — ABNORMAL HIGH (ref 26.0–34.0)
MCHC: 33.3 g/dL (ref 30.0–36.0)
MCV: 102.7 fL — ABNORMAL HIGH (ref 80.0–100.0)
Platelets: 61 10*3/uL — ABNORMAL LOW (ref 150–400)
RBC: 2.6 MIL/uL — ABNORMAL LOW (ref 4.22–5.81)
RDW: 27.2 % — ABNORMAL HIGH (ref 11.5–15.5)
WBC: 2.8 10*3/uL — ABNORMAL LOW (ref 4.0–10.5)
nRBC: 0 % (ref 0.0–0.2)

## 2023-01-05 MED ORDER — DOXYCYCLINE HYCLATE 100 MG PO TABS: 100.0000 mg | ORAL_TABLET | Freq: Two times a day (BID) | ORAL | 0 refills | Status: AC

## 2023-01-05 MED ORDER — CEFDINIR 300 MG PO CAPS
300.0000 mg | ORAL_CAPSULE | Freq: Once | ORAL | Status: AC
Start: 2023-01-05 — End: 2023-01-05
  Administered 2023-01-05: 300 mg via ORAL
  Filled 2023-01-05: qty 1

## 2023-01-05 MED ORDER — ALBUTEROL SULFATE (2.5 MG/3ML) 0.083% IN NEBU: 2.5000 mg | INHALATION_SOLUTION | RESPIRATORY_TRACT | 2 refills | Status: DC | PRN

## 2023-01-05 MED ORDER — CARVEDILOL 3.125 MG PO TABS: 3.1250 mg | ORAL_TABLET | Freq: Two times a day (BID) | ORAL | 1 refills | Status: DC

## 2023-01-05 MED ORDER — SENNA 8.6 MG PO TABS: 2.0000 | ORAL_TABLET | Freq: Every day | ORAL | 2 refills | Status: DC

## 2023-01-05 MED ORDER — GUAIFENESIN ER 600 MG PO TB12: 600.0000 mg | ORAL_TABLET | Freq: Two times a day (BID) | ORAL | 0 refills | Status: AC

## 2023-01-05 MED ORDER — CEFDINIR 300 MG PO CAPS: 300.0000 mg | ORAL_CAPSULE | Freq: Every day | ORAL | 0 refills | Status: AC

## 2023-01-05 MED ORDER — FUROSEMIDE 40 MG PO TABS
40.0000 mg | ORAL_TABLET | Freq: Once | ORAL | Status: AC
  Administered 2023-01-05: 40 mg via ORAL
  Filled 2023-01-05: qty 1

## 2023-01-05 NOTE — Progress Notes (Signed)
Called report to [place], LPN at [facility].

## 2023-01-05 NOTE — Patient Instructions (Signed)
Visit Information  Thank you for taking time to visit with me today. Please don't hesitate to contact me if I can be of assistance to you.   Following are the goals we discussed today:   Goals Addressed             This Visit's Progress    Assist with Pursuing Long-Term Care Placement Options.   On track    Care Coordination Interventions.  Interventions Today    Flowsheet Row Most Recent Value  Chronic Disease   Chronic disease during today's visit Hypertension (HTN), Diabetes, Other, Chronic Kidney Disease/End Stage Renal Disease (ESRD)  [Vascular Dementia Without Behavioral Disturbance, Acute Metabolic Encephalopathy, Chronic Indwelling Catheter, Hyperlipidemia, Peripheral Edema, Glaucoma, Financial Insecurities, Inability to Perform Activities of Daily Living Independently.]  General Interventions   General Interventions Discussed/Reviewed General Interventions Discussed, Labs, Vaccines, Doctor Visits, Communication with, Level of Care, [facility], Horticulturist, commercial (DME), Annual Eye Exam, General Interventions Reviewed, Annual Foot Exam, Health Screening  [Encouraged Routine Engagement with Care Team Members & Providers.]  Labs --  [Encouraged Routine Labwork.]  Vaccines COVID-19, Flu, Pneumonia, RSV, Shingles, Tetanus/Pertussis/Diphtheria  [Encouraged Annual Vaccinations.]  Doctor Visits Discussed/Reviewed Specialist, Doctor Visits Reviewed, Doctor Visits Discussed, Annual Wellness Visits, PCP  [Encouraged Routine Engagement with Care Team Members & Providers.]  Health Screening Colonoscopy, Bone Density, Prostate  [Encouraged Annual Health Screenings.]  Durable Medical Equipment (DME) Other, BP Cuff, Glucomoter, Environmental consultant, Industrial/product designer, [employer], Scales, Engineer, materials in Air traffic controller, Paediatric nurse with [employer].]  Wheelchair Standard  PCP/Specialist Visits Compliance with follow-up visit  [Encouraged Routine Engagement with Care Team Members &  Providers.]  Communication with PCP/Specialists, Charity fundraiser, Pharmacists, Social Work  [employer] Routine Engagement with Care Team Members & Providers.]  Level of Care Adult Daycare, Air traffic controller, Assisted Living, Skilled Nursing Facility  [Confirmed Disinterest in Enrollment in Adult Day Care Program. Confirmed Interest in Enrollment in Higher Level of Care (I.e Skilled Nursing Facility Placement for [employer]).]  Applications Medicaid, Personal Care Services  [facility] Active [place] Access Adult Medicaid Status. Confirmed Interest in Applying for Personal Care Services, Mailing Application, Offering Assistance with Completion & Submission.]  Exercise Interventions   Exercise Discussed/Reviewed Exercise Discussed, Assistive device use and maintanence, Weight Managment, Physical Activity, Exercise Reviewed  [Encouraged Participation in Daily Exercise Regimen.]  Physical Activity Discussed/Reviewed Physical Activity Discussed, Home Exercise Program (HEP), PREP, Physical Activity Reviewed, Gym, Types of exercise  [Encouraged Increased Level of Activity & Exercise, Inside & Outside the Home, as Tolerated.]  Weight Management Weight loss  [Encouraged Healthy Weight Loss Program.]  Education Interventions   Education Provided Provided Therapist, sports, Provided Web-based Education, Provided Education  [employer] Reviewed Educational Material & Encouraged Implementation.]  Provided Verbal Education On Nutrition, Mental Health/Coping with Illness, When to see the doctor, Foot Care, Eye Care, Labs, Blood Sugar Monitoring, Applications, [employer], Exercise, Medication, Development worker, community  [employer] Consideration of Educational Material Reviewed.]  Ship broker, Personal Care Services  [employer] Active [place] Access Adult Medicaid Status. Confirmed Interest in Applying for Personal Care Services, Mailing Application, Offering Assistance with Completion & Submission.]   Mental Health Interventions   Mental Health Discussed/Reviewed Mental Health Discussed, Anxiety, Mental Health Reviewed, Grief and Loss, Depression, Substance Abuse, Coping Strategies, Suicide, Crisis, Other  [Assessed Mental Health & Cognitive Status.]  Nutrition Interventions   Nutrition Discussed/Reviewed Nutrition Discussed, Nutrition Reviewed, Carbohydrate meal planning, Increasing proteins, Adding fruits and vegetables, Decreasing fats, Fluid intake, Decreasing salt, Portion sizes, Decreasing sugar intake  [  Encouraged Heart-Healthy, Diabetic & Renal-Friendly, Low Fat, Reduced Sugar, Low Sodium Diet.]  Pharmacy Interventions   Pharmacy Dicussed/Reviewed Pharmacy Topics Discussed, Medications and their functions, Pharmacy Topics Reviewed, Medication Adherence, Affording Medications  [Confirmed Ability to Afford Prescription Medications.]  Medication Adherence --  [Confirmed Compliance with Prescription Medications.]  Safety Interventions   Safety Discussed/Reviewed Safety Discussed, Safety Reviewed, Fall Risk, Home Safety  [Encouraged Consideration of Home Safety Evaluation.]  Home Safety Assistive Devices, Need for home safety assessment  [Encouraged Routine Use of Assistive Devices & Durable Medical Equipment.]  Advanced Directive Interventions   Advanced Directives Discussed/Reviewed Advanced Directives Discussed, Advanced Directives Reviewed  [Encouraged Initiation of Advanced Directives (Living Will & Healthcare Power of Corporate treasurer), Offering to [employer], Assist with Completion, Make Copies & Scan into Electronic Medical Record in Epic.]      Screened for Signs & Symptoms of Depression, Related to Chronic Disease State.  PHQ2 & PHQ9 Depression Screen Completed & Results Reviewed.  Suicidal Ideation & Homicidal Ideation Assessed - None Present.   Domestic Violence Assessed - None Present. Access to Weapons Assessed - None Present.   Active Listening & Reflection Utilized.   Verbalization of Feelings Encouraged.  Emotional Support Provided. Feelings of Caregiver Burnout Validated. Caregiver Stress Acknowledged. Caregiver Resources Reviewed. Caregiver Support Groups Mailed. Self-Enrollment in Caregiver Support Group of Interest Emphasized. Crisis Support Information, Agencies, Services & Resources Discussed. Problem Solving Interventions Identified. Task-Centered Solutions Implemented.   Solution-Focused Strategies Developed. Acceptance & Commitment Therapy Introduced. Brief Cognitive Behavioral Therapy Initiated. Client-Centered Therapy Enacted. Reviewed Prescription Medications & Discussed Importance of Compliance. Quality of Sleep Assessed & Sleep Hygiene Techniques Promoted. CSW Collaboration with Daughter, [name] to Confirm Discharge from [facility] on 01/05/2023 & Transfer [facility] [phone]), to Receive Short-Term Rehabilitative Services. CSW Collaboration with Daughter, [name] to Encourage Routine Engagement with [name], Licensed Clinical Social Worker with [facility] [phone]), If You Have Questions, Need Assistance, or If Additional Social Work Needs Are Identified Between Now & Our Next Follow-Up Outreach Call, Scheduled on 01/14/2023 at 11:15 AM.      Our next appointment is by telephone on 01/14/2023 at 11:15 am.  Please call the care guide team at [phone] if you need to cancel or reschedule your appointment.   If you are experiencing a Mental Health or Behavioral Health Crisis or need someone to talk to, please call the Suicide and Crisis Lifeline: 988 call the [place] National Suicide Prevention Lifeline: 7732764482 or TTY: 336-701-3558 TTY 972-022-7876) to talk to a trained counselor call 1-800-273-TALK (toll free, 24 hour hotline) go to [facility] [address] [place]  [phone]) call the [facility] Crisis Line: 437-676-4506 call 911  Patient verbalizes understanding of instructions and care plan provided today and agrees to view in MyChart. Active MyChart status and patient understanding of how to access instructions and care plan via MyChart confirmed with patient.     Telephone follow up appointment with care management team member scheduled for:  01/14/2023 at 11:15 am.  [name], BSW, MSW, LCSW  Embedded Practice Social Work Case Manager  [facility], Population Health Direct Dial: [phone]  Fax: [phone] Email: [email or web address] Website: [email or web address]

## 2023-01-05 NOTE — TOC Transition Note (Signed)
Transition of Care [facility]) - Discharge Note   Patient Details  Name: Terrance Miller MRN: [id number] Date of Birth: 11-03-1928  [facility]) CM/SW Contact:  Isabella Bowens, LCSWA Phone Number: 01/05/2023, 10:44 AM   Clinical Narrative:    CSW checked pt authorization and it was approved for him to go to [facility]. CSW spoke with Eunice Blase at [facility] and shared that pt was ready for DC today. [name] provided room number , C29-2 and number to call for report , [phone]. Nurse made aware and Daughter was notified. Med necessity form was completed and CSW will call [facility] once ready.    Final next level of care: Skilled Nursing Facility Barriers to Discharge: Barriers Resolved   Patient Goals and CMS Choice Patient states their goals for this hospitalization and ongoing recovery are:: DC to [facility].gov Compare Post Acute Care list provided to:: Patient Represenative (must comment) (Daughter [name] made aware) Choice offered to / list presented to : Adult Children      Discharge Placement                Patient to be transferred to facility by: Ambulance Name of family member notified: [name] Patient and family notified of of transfer: 01/05/23  Discharge Plan and Services Additional resources added to the After Visit Summary for        Social Drivers of Health (SDOH) Interventions SDOH Screenings   Food Insecurity: No Food Insecurity (01/01/2023)  Housing: Low Risk  (01/01/2023)  Transportation Needs: No Transportation Needs (01/01/2023)  Utilities: Not At Risk (01/01/2023)  Alcohol Screen: Low Risk  (07/25/2022)  Depression (PHQ2-9): Low Risk  (08/25/2022)  Financial Resource Strain: Low Risk  (08/20/2022)  Physical Activity: Insufficiently Active (07/25/2022)  Social Connections: Socially Isolated (07/25/2022)  Stress: No Stress Concern Present (08/20/2022)  Tobacco Use: Medium Risk (01/01/2023)  Health Literacy: Adequate  Health Literacy (07/25/2022)     Readmission Risk Interventions    01/05/2023   10:39 AM 12/20/2022   11:51 AM  Readmission Risk Prevention Plan  Transportation Screening Complete Complete  HRI or Home Care Consult  Complete  Social Work Consult for Recovery Care Planning/Counseling  Complete  Palliative Care Screening  Not Applicable  Medication Review Oceanographer) Complete Complete  HRI or Home Care Consult Complete   SW Recovery Care/Counseling Consult Complete   Palliative Care Screening Complete   Skilled Nursing Facility Complete

## 2023-01-05 NOTE — Discharge Summary (Signed)
Terrance Miller, is a 89 y.o. male  DOB Jul 06, 1928  MRN [id number].  Admission date:  12/26/2022  Admitting Physician  Dolly Rias, MD  Discharge Date:  01/05/2023   Primary MD  [name], DO  Recommendations for primary care physician for things to follow:  1)Avoid ibuprofen/Advil/Aleve/Motrin/Goody Powders/Naproxen/BC powders/Meloxicam/Diclofenac/Indomethacin and other Nonsteroidal anti-inflammatory medications as these will make you more likely to bleed and can cause stomach ulcers, can also cause Kidney problems.   2)Repeat CBC and BMP Blood Tests on Friday 01/09/23  3)Patient has a Coude (Foley) Catheter--- placed 12/19/22  --routine Foley catheter care advised, change prn -Outpatient urology follow-up with Dr. Ronne Binning advised  Admission Diagnosis  Confusion [R41.0] Influenza A [J10.1] Generalized weakness [R53.1]  Discharge Diagnosis  Confusion [R41.0] Influenza A [J10.1] Generalized weakness [R53.1]    Principal Problem:   Influenza A     Past Medical History:  Diagnosis Date   Acute ST elevation myocardial infarction (STEMI) of inferior wall (HCC) 2004   Coronary artery disease    DES x2 to the RCA 2004, residual disease managed medically   Hyperlipidemia    Hypertension    Type 2 diabetes mellitus (HCC)     Past Surgical History:  Procedure Laterality Date   BIOPSY  01/28/2022   Procedure: BIOPSY;  Surgeon: Marguerita Merles, Reuel Boom, MD;  Location: [facility];  Service: Gastroenterology;;   CATARACT EXTRACTION Bilateral    CORONARY ANGIOPLASTY WITH STENT PLACEMENT  09/05/2002   stent RCA, 80% first diagonal, 70-80% mid-diagonal, 80% mid LAD stenosis   FLEXIBLE SIGMOIDOSCOPY N/A 01/28/2022   Procedure: FLEXIBLE SIGMOIDOSCOPY;  Surgeon: Dolores Frame, MD;  Location: [facility];  Service: Gastroenterology;  Laterality: N/A;   HYDROCELE EXCISION Bilateral  11/20/2021   Procedure: HYDROCELECTOMY ADULT;  Surgeon: Milderd Meager., MD;  Location: [facility];  Service: Urology;  Laterality: Bilateral;   IMPACTION REMOVAL  01/28/2022   Procedure: IMPACTION REMOVAL;  Surgeon: Marguerita Merles, Reuel Boom, MD;  Location: [facility];  Service: Gastroenterology;;   NM MYOCAR PERF WALL MOTION  11/01/2008   Normal     HPI  from the history and physical done on the day of admission:   HPI:  Terrance Miller is a 89 y.o. 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 Has been in ED since this time awaiting SNF placement, currently awaiting auth. Today noted to have borderline O2 sat, worsened resp status / breath sounds and evaluation revealed influenza, heart failure, AKI and called for admission. On interview he is confused and not able to provide significant information. He reports having a cough although not short of breath. Has scrotal swelling. Otherwise no other complaints.      Review of Systems:  ROS complete and negative except as marked above    Hospital Course:    Brief Summary:- Terrance Miller is a 89  y.o. 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 Has been in ED since this time awaiting SNF placement, currently awaiting auth. Today noted to have borderline O2 sat, worsened resp status / breath sounds and evaluation revealed influenza, heart failure, AKI and called for admission.    Assessment and Plan: 1)Influenza A pneumonia -Respiratory symptoms improved -Continue bronchodilators and mucolytics -Completed  Tamiflu -Weaned off oxygen -Repeat chest x-ray on 01/04/2023 with left basilar patchy opacity cannot exclude aspiration pneumonia or post influenza pneumonia-- --Rocephin/doxycycline-started -Okay to discharge on doxycycline and Omnicef   2) acute on chronic diastolic dysfunction CHF exacerbation /HFpEF- BNP 1097--chest x-ray consistent with CHF -Repeat echo on 01/02/2023 with EF of 50%, without aortic stenosis -Lasix was discontinued given resolution of hypoxia and worsening renal function Wt is 219 >>211 lb>> 211>>212 -- Okay to restart Lasix   3)Acute Hypoxic Respiratory Failure--due to #1 #2 above -Hypoxia resolved with treatment of #1 #2 above - weaned down to room air   4)Dysphagia--speech pathologist eval appreciated recommends regular diet and thin liquids   5)AKI----acute kidney injury on CKD stage -3B -   creatinine on admission=1.50  ,  baseline creatinine =1.5 to 1.6    ,  --creatinine is now=2.1   -renally adjust medications, avoid nephrotoxic agents / dehydration  / hypotension  -Patient has chronic indwelling Foley -Repeat BMP on Friday, 01/09/2023   6)Deconditioning/  Generalized weakness and ambulatory dysfunction - Physical therapy recommended SNF rehab -Discharge to [facility]    7)History of dementia -obvious baseline cognitive and memory deficits noted  -not any medications at home, no behavior disturbances   8)CAD with PCI: Asymptomatic, continue his aspirin and rosuvastatin   9)HTN--   stable, continue Amlodipine 2.5 mg daily   10)DM2-  Blood glucose within goal range,  =-A1c in 2023 was 6.3 -Managed with diet, no medications at this time   11)H/o Recent CAUTI/chronic urinary retention--POA -Patient has chronic indwelling Foley catheter -Foley catheter previously changed 11/22/2022 -Foley catheter was changed on 12/19/2022 Recently treated with IV ceftriaxone , discharged on 12/21/2022 on Omnicef for 5 days -However urine culture from  12/19/2022 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 Pete Glatter in 11/2021  -Outpatient urology follow-up -Continue Flomax   12)Pancytopenia--- in the setting of influenza infection with possible pneumonia --At baseline patient has a history of chronic anemia and chronic thrombocytopenia -Repeat CBC on Friday, 01/08/2022   13)Morbid Obesity- -Low calorie diet, portion control and increase physical activity discussed with patient -Body mass index is 34.16 kg/m.   Disposition---  discharge to SNF   - Skin Assessment: As outlined belowe:     Pressure Injury 01/28/22 Scrotum Left;Right;Posterior Stage 2 -  Partial thickness loss of dermis presenting as a shallow open injury with a red, pink wound bed without slough. Partial thickness (Active)  01/28/22 0040  Location: Scrotum  Location Orientation: Left;Right;Posterior  Staging: Stage 2 -  Partial thickness loss of dermis presenting as a shallow open injury with a red, pink wound bed without slough.  Wound Description (Comments): Partial thickness  Present on Admission: Yes   Discharge Condition: Stable without hypoxia  Follow UP   Contact information  for follow-up providers     McKenzie, Mardene Celeste, MD. Schedule an appointment as soon as possible for a visit in 1 week(s).   Specialty: Urology Why: Coude catheter placed 12/19/22 Contact information: [address] [place] [address] [phone]              Contact information for after-discharge care     Destination     [name] .   Service: Skilled Nursing Contact information: [address] [place] [address] [phone]                      Diet and Activity recommendation:  As advised  Discharge  Instructions   Discharge Instructions     Call MD for:  difficulty breathing, headache or visual disturbances   Complete by: As directed    Call MD for:  persistant dizziness or light-headedness   Complete by: As directed    Call MD for:  persistant nausea and vomiting   Complete by: As directed    Call MD for:  temperature >100.4   Complete by: As directed    Diet - low sodium heart healthy   Complete by: As directed    Discharge instructions   Complete by: As directed    1)Avoid ibuprofen/Advil/Aleve/Motrin/Goody Powders/Naproxen/BC powders/Meloxicam/Diclofenac/Indomethacin and other Nonsteroidal anti-inflammatory medications as these will make you more likely to bleed and can cause stomach ulcers, can also cause Kidney problems.   2)Repeat CBC and BMP Blood Tests on Friday 01/09/23  3)Patient has a Coude (Foley) Catheter--- placed 12/19/22  --routine Foley catheter care advised, change prn -Outpatient urology follow-up with Dr. Ronne Binning advised   Discharge wound care:   Complete by: As directed    As above   Increase activity slowly   Complete by: As directed        Discharge Medications     Allergies as of 01/05/2023       Reactions   Clobetasol         Medication List     STOP taking these medications    loperamide 2 MG capsule Commonly known as: IMODIUM   potassium chloride 10 MEQ tablet Commonly known as: KLOR-CON       TAKE these medications    acetaminophen 325 MG tablet Commonly known as: TYLENOL Take 2 tablets (650 mg total) by mouth every 6 (six) hours as needed for mild pain (or Fever >/= 101).   albuterol (2.5 MG/3ML) 0.083% nebulizer solution Commonly known as: PROVENTIL Take 3 mLs (2.5 mg total) by nebulization every 4 (four) hours as needed for wheezing or shortness of breath.   amLODipine 2.5 MG tablet Commonly known as: NORVASC Take 1 tablet (2.5 mg total) by mouth daily.   aspirin EC 81 MG tablet Take 1 tablet (81 mg total) by  mouth daily with breakfast. Swallow whole.   bisacodyl 10 MG suppository Commonly known as: DULCOLAX Place 1 suppository (10 mg total) rectally every Monday, Wednesday, and Friday. What changed:  when to take this reasons to take this   carvedilol 3.125 MG tablet Commonly known as: COREG Take 1 tablet (3.125 mg total) by mouth 2 (two) times daily. What changed:  medication strength how much to take   cefdinir 300 MG capsule Commonly known as: OMNICEF Take 1 capsule (300 mg total) by mouth daily for 5 days. Start taking on: January 06, 2023 What changed: when to take this   cholecalciferol  1000 units tablet Commonly known as: VITAMIN D Take 1,000 Units by mouth daily.   cyanocobalamin 1000 MCG tablet Commonly known as: VITAMIN B12 Take 1,000 mcg by mouth daily.   doxycycline 100 MG tablet Commonly known as: VIBRA-TABS Take 1 tablet (100 mg total) by mouth 2 (two) times daily for 5 days.   folic acid 1 MG tablet Commonly known as: FOLVITE Take 1 tablet (1 mg total) by mouth daily.   furosemide 20 MG tablet Commonly known as: Lasix Take 1 tablet (20 mg total) by mouth daily.   guaiFENesin 600 MG 12 hr tablet Commonly known as: Mucinex Take 1 tablet (600 mg total) by mouth 2 (two) times daily for 10 days.   latanoprost 0.005 % ophthalmic solution Commonly known as: XALATAN Place 1 drop into both eyes 2 (two) times daily.   polyethylene glycol 17 g packet Commonly known as: MIRALAX / GLYCOLAX Take 17 g by mouth 2 (two) times daily. What changed:  when to take this reasons to take this   rosuvastatin 5 MG tablet Commonly known as: CRESTOR Take 5 mg by mouth daily.   senna 8.6 MG Tabs tablet Commonly known as: SENOKOT Take 2 tablets (17.2 mg total) by mouth at bedtime.   tamsulosin 0.4 MG Caps capsule Commonly known as: FLOMAX Take 1 capsule (0.4 mg total) by mouth daily.               Discharge Care Instructions  (From admission, onward)            Start     Ordered   01/05/23 0000  Discharge wound care:       Comments: As above   01/05/23 1121           Major procedures and Radiology Reports - PLEASE review detailed and final reports for all details, in brief -   DG Chest 2 View Result Date: 01/04/2023 CLINICAL DATA:  Influenza.  Dyspnea. EXAM: CHEST - 2 VIEW COMPARISON:  Chest radiograph dated 12/31/2022 FINDINGS: Normal lung volumes. Persistent bilateral interstitial opacities. Left basilar patchy opacity. No pleural effusion or pneumothorax. Similar enlarged cardiomediastinal silhouette. No acute osseous abnormality. Scattered metallic BBs over the left chest wall. IMPRESSION: 1. Persistent bilateral interstitial opacities, which may represent pulmonary edema or atypical infection. 2. Left basilar patchy opacity, which may represent atelectasis, aspiration, or pneumonia. Electronically Signed   By: Agustin Cree M.D.   On: 01/04/2023 16:20   ECHOCARDIOGRAM COMPLETE Result Date: 01/02/2023    ECHOCARDIOGRAM REPORT   Patient Name:   Terrance Miller Date of Exam: 01/02/2023 Medical Rec #:  [id number]    Height:       66.0 in Accession #:    [id number]   Weight:       219.4 lb Date of Birth:  Nov 09, 1928   BSA:          2.080 m Patient Age:    89 years     BP:           105/63 mmHg Patient Gender: M            HR:           64 bpm. [facility] Procedure: 2D Echo, Cardiac Doppler, Color Doppler and Intracardiac            Opacification Agent Indications:    CHF I50.9  History:        Patient has prior history of Echocardiogram examinations, most  recent 09/19/2021. CHF; Previous Myocardial Infarction and CAD.  Sonographer:    Webb Laws Referring Phys: [id number] JONATHAN SEGARS IMPRESSIONS  1. Left ventricular ejection fraction, by estimation, is 50%. The left ventricle has mildly decreased function. Left ventricular diastolic parameters are indeterminate.  2. Right ventricular systolic function is mildly  reduced. The right ventricular size is mildly enlarged.  3. Trivial mitral valve regurgitation.  4. The aortic valve is tricuspid. Aortic valve regurgitation is not visualized. Aortic valve sclerosis/calcification is present, without any evidence of aortic stenosis.  5. The inferior vena cava is dilated in size with <50% respiratory variability, suggesting right atrial pressure of 15 mmHg. FINDINGS  Left Ventricle: Left ventricular ejection fraction, by estimation, is 50%. The left ventricle has mildly decreased function. The left ventricular internal cavity size was normal in size. There is no left ventricular hypertrophy. Left ventricular diastolic parameters are indeterminate. Right Ventricle: The right ventricular size is mildly enlarged. Right vetricular wall thickness was not assessed. Right ventricular systolic function is mildly reduced. Left Atrium: Left atrial size was normal in size. Right Atrium: Right atrial size was normal in size. Pericardium: There is no evidence of pericardial effusion. Mitral Valve: Trivial mitral valve regurgitation. Tricuspid Valve: The tricuspid valve is normal in structure. Tricuspid valve regurgitation is mild. Aortic Valve: The aortic valve is tricuspid. Aortic valve regurgitation is not visualized. Aortic valve sclerosis/calcification is present, without any evidence of aortic stenosis. Pulmonic Valve: The pulmonic valve was not well visualized. Pulmonic valve regurgitation is mild. Aorta: The aortic root is normal in size and structure. Venous: The inferior vena cava is dilated in size with less than 50% respiratory variability, suggesting right atrial pressure of 15 mmHg. IAS/Shunts: No atrial level shunt detected by color flow Doppler.  LEFT VENTRICLE PLAX 2D LVIDd:         5.00 cm      Diastology LVIDs:         3.70 cm      LV e' medial:    6.74 cm/s LV PW:         0.90 cm      LV E/e' medial:  12.4 LV IVS:        1.00 cm      LV e' lateral:   8.92 cm/s LVOT diam:      2.10 cm      LV E/e' lateral: 9.4 LV SV:         73 LV SV Index:   35 LVOT Area:     3.46 cm  LV Volumes (MOD) LV vol d, MOD A2C: 106.0 ml LV vol d, MOD A4C: 104.0 ml LV vol s, MOD A2C: 36.9 ml LV vol s, MOD A4C: 58.6 ml LV SV MOD A2C:     69.1 ml LV SV MOD A4C:     104.0 ml LV SV MOD BP:      62.4 ml RIGHT VENTRICLE            IVC RV Basal diam:  3.60 cm    IVC diam: 2.50 cm RV S prime:     6.42 cm/s TAPSE (M-mode): 1.3 cm LEFT ATRIUM             Index        RIGHT ATRIUM           Index LA diam:        4.60 cm 2.21 cm/m   RA Area:     19.20 cm LA Vol (A2C):  56.1 ml 26.97 ml/m  RA Volume:   51.60 ml  24.81 ml/m LA Vol (A4C):   45.4 ml 21.83 ml/m LA Biplane Vol: 54.6 ml 26.25 ml/m  AORTIC VALVE LVOT Vmax:   106.00 cm/s LVOT Vmean:  68.200 cm/s LVOT VTI:    0.210 m  AORTA Ao Root diam: 3.40 cm MITRAL VALVE               TRICUSPID VALVE MV Area (PHT): 3.53 cm    TR Peak grad:   34.3 mmHg MV Decel Time: 215 msec    TR Vmax:        293.00 cm/s MV E velocity: 83.90 cm/s                            SHUNTS                            Systemic VTI:  0.21 m                            Systemic Diam: 2.10 cm Dietrich Pates MD Electronically signed by Dietrich Pates MD Signature Date/Time: 01/02/2023/1:55:19 PM    Final    DG Abd 1 View Result Date: 01/01/2023 CLINICAL DATA:  Abdominal distention EXAM: ABDOMEN - 1 VIEW COMPARISON:  Chest radiograph 12/31/2022. CT abdomen and pelvis 01/27/2022 FINDINGS: Moderate gaseous distention of the stomach, possibly physiologic or dysmotility. Mild gaseous distention of mid abdominal small bowel and colon. Changes could indicate ileus or obstruction. No radiopaque stones. Degenerative changes in the spine and hips. Vascular calcifications. Lung bases are clear. IMPRESSION: Gaseous distention of stomach, small bowel, and colon most likely representing ileus but could indicate obstruction. Electronically Signed   By: Burman Nieves M.D.   On: 01/01/2023 00:31   DG Chest Port 1  View Result Date: 12/31/2022 CLINICAL DATA:  Coughing after attempting to eat EXAM: PORTABLE CHEST 1 VIEW COMPARISON:  Chest radiograph dated 12/31/2022 FINDINGS: Normal lung volumes. Mild bilateral interstitial opacities. Left retrocardiac patchy opacity. No pleural effusion or pneumothorax. Similar mildly enlarged cardiomediastinal silhouette. No acute osseous abnormality. IMPRESSION: 1. Mild bilateral interstitial opacities, which may represent pulmonary edema. 2. Left retrocardiac patchy opacity, which may represent atelectasis, aspiration, or pneumonia. 3. Similar mild cardiomegaly. Electronically Signed   By: Agustin Cree M.D.   On: 12/31/2022 21:08   DG Chest Port 1 View Result Date: 12/31/2022 CLINICAL DATA:  Shortness of breath EXAM: PORTABLE CHEST 1 VIEW COMPARISON:  Chest radiograph dated 01/29/2022 FINDINGS: Normal lung volumes. Persistent diffuse interstitial opacities. No pleural effusion or pneumothorax. Similar enlarged cardiomediastinal silhouette. No acute osseous abnormality. IMPRESSION: Persistent diffuse interstitial opacities, likely pulmonary edema. Electronically Signed   By: Agustin Cree M.D.   On: 12/31/2022 14:21   DG Chest Port 1 View Result Date: 12/30/2022 CLINICAL DATA:  [id number] Aspiration into airway [id number] EXAM: PORTABLE CHEST 1 VIEW COMPARISON:  12/27/2022 FINDINGS: Heart is upper limits normal in size. Mediastinal contours within normal limits. Aortic atherosclerosis. Vascular congestion. Stable chronic increased interstitial markings with some improvement since prior study, likely improving edema superimposed on chronic lung disease. No effusions or acute bony abnormality. IMPRESSION: Borderline heart size with vascular congestion. Interstitial prominence improving since prior study, likely improving edema superimposed on chronic lung disease. Electronically Signed   By: Charlett Nose M.D.   On: 12/30/2022 02:44   DG  Chest Portable 1 View Result Date: 12/27/2022 CLINICAL  DATA:  Altered mental status x3 days. EXAM: PORTABLE CHEST 1 VIEW COMPARISON:  July 29, 2022 FINDINGS: The cardiac silhouette is mildly enlarged and unchanged in size. There is marked severity calcification of the thoracic aorta. Stable, chronic appearing mild to moderate severity increased interstitial lung markings are seen. Mild atelectasis and/or infiltrate is seen within the left lung base. No pleural effusion or pneumothorax is identified. Radiopaque BBs are seen overlying the lateral left chest wall. Multilevel degenerative changes seen throughout the thoracic spine. IMPRESSION: Stable cardiomegaly and chronic appearing increased interstitial lung markings with mild left basilar atelectasis and/or infiltrate. A mild superimposed component of interstitial edema cannot be excluded. Electronically Signed   By: Aram Candela M.D.   On: 12/27/2022 02:54    Micro Results  Recent Results (from the past 240 hours)  Resp panel by RT-PCR (RSV, Flu A&B, Covid) Anterior Nasal Swab     Status: None   Collection Time: 12/26/22  7:17 PM   Specimen: Anterior Nasal Swab  Result Value Ref Range Status   SARS Coronavirus 2 by RT PCR NEGATIVE NEGATIVE Final    Comment: (NOTE) SARS-CoV-2 target nucleic acids are NOT DETECTED.  The SARS-CoV-2 RNA is generally detectable in upper respiratory specimens during the acute phase of infection. The lowest concentration of SARS-CoV-2 viral copies this assay can detect is 138 copies/mL. A negative result does not preclude SARS-Cov-2 infection and should not be used as the sole basis for treatment or other patient management decisions. A negative result may occur with  improper specimen collection/handling, submission of specimen other than nasopharyngeal swab, presence of viral mutation(s) within the areas targeted by this assay, and inadequate number of viral copies(<138 copies/mL). A negative result must be combined with clinical observations, patient history,  and epidemiological information. The expected result is Negative.  Fact Sheet for Patients:  BloggerCourse.com  Fact Sheet for Healthcare Providers:  SeriousBroker.it  This test is no t yet approved or cleared by the [place] FDA and  has been authorized for detection and/or diagnosis of SARS-CoV-2 by FDA under an Emergency Use Authorization (EUA). This EUA will remain  in effect (meaning this test can be used) for the duration of the COVID-19 declaration under Section 564(b)(1) of the Act, 21 U.S.C.section 360bbb-3(b)(1), unless the authorization is terminated  or revoked sooner.       Influenza A by PCR NEGATIVE NEGATIVE Final   Influenza B by PCR NEGATIVE NEGATIVE Final    Comment: (NOTE) The Xpert Xpress SARS-CoV-2/FLU/RSV plus assay is intended as an aid in the diagnosis of influenza from Nasopharyngeal swab specimens and should not be used as a sole basis for treatment. Nasal washings and aspirates are unacceptable for Xpert Xpress SARS-CoV-2/FLU/RSV testing.  Fact Sheet for Patients: BloggerCourse.com  Fact Sheet for Healthcare Providers: SeriousBroker.it  This test is not yet approved or cleared by the [place] FDA and has been authorized for detection and/or diagnosis of SARS-CoV-2 by FDA under an Emergency Use Authorization (EUA). This EUA will remain in effect (meaning this test can be used) for the duration of the COVID-19 declaration under Section 564(b)(1) of the Act, 21 U.S.C. section 360bbb-3(b)(1), unless the authorization is terminated or revoked.     Resp Syncytial Virus by PCR NEGATIVE NEGATIVE Final    Comment: (NOTE) Fact Sheet for Patients: BloggerCourse.com  Fact Sheet for Healthcare Providers: SeriousBroker.it  This test is not yet approved or cleared by the [place] FDA  and has  been authorized for detection and/or diagnosis of SARS-CoV-2 by FDA under an Emergency Use Authorization (EUA). This EUA will remain in effect (meaning this test can be used) for the duration of the COVID-19 declaration under Section 564(b)(1) of the Act, 21 U.S.C. section 360bbb-3(b)(1), unless the authorization is terminated or revoked.  Performed at [facility], [address] [place], [place] [address]   Resp panel by RT-PCR (RSV, Flu A&B, Covid) Anterior Nasal Swab     Status: Abnormal   Collection Time: 12/31/22  8:54 PM   Specimen: Anterior Nasal Swab  Result Value Ref Range Status   SARS Coronavirus 2 by RT PCR NEGATIVE NEGATIVE Final    Comment: (NOTE) SARS-CoV-2 target nucleic acids are NOT DETECTED.  The SARS-CoV-2 RNA is generally detectable in upper respiratory specimens during the acute phase of infection. The lowest concentration of SARS-CoV-2 viral copies this assay can detect is 138 copies/mL. A negative result does not preclude SARS-Cov-2 infection and should not be used as the sole basis for treatment or other patient management decisions. A negative result may occur with  improper specimen collection/handling, submission of specimen other than nasopharyngeal swab, presence of viral mutation(s) within the areas targeted by this assay, and inadequate number of viral copies(<138 copies/mL). A negative result must be combined with clinical observations, patient history, and epidemiological information. The expected result is Negative.  Fact Sheet for Patients:  BloggerCourse.com  Fact Sheet for Healthcare Providers:  SeriousBroker.it  This test is no t yet approved or cleared by the [place] FDA and  has been authorized for detection and/or diagnosis of SARS-CoV-2 by FDA under an Emergency Use Authorization (EUA). This EUA will remain  in effect (meaning this test can be used) for the duration of  the COVID-19 declaration under Section 564(b)(1) of the Act, 21 U.S.C.section 360bbb-3(b)(1), unless the authorization is terminated  or revoked sooner.       Influenza A by PCR POSITIVE (A) NEGATIVE Final   Influenza B by PCR NEGATIVE NEGATIVE Final    Comment: (NOTE) The Xpert Xpress SARS-CoV-2/FLU/RSV plus assay is intended as an aid in the diagnosis of influenza from Nasopharyngeal swab specimens and should not be used as a sole basis for treatment. Nasal washings and aspirates are unacceptable for Xpert Xpress SARS-CoV-2/FLU/RSV testing.  Fact Sheet for Patients: BloggerCourse.com  Fact Sheet for Healthcare Providers: SeriousBroker.it  This test is not yet approved or cleared by the [place] FDA and has been authorized for detection and/or diagnosis of SARS-CoV-2 by FDA under an Emergency Use Authorization (EUA). This EUA will remain in effect (meaning this test can be used) for the duration of the COVID-19 declaration under Section 564(b)(1) of the Act, 21 U.S.C. section 360bbb-3(b)(1), unless the authorization is terminated or revoked.     Resp Syncytial Virus by PCR NEGATIVE NEGATIVE Final    Comment: (NOTE) Fact Sheet for Patients: BloggerCourse.com  Fact Sheet for Healthcare Providers: SeriousBroker.it  This test is not yet approved or cleared by the [place] FDA and has been authorized for detection and/or diagnosis of SARS-CoV-2 by FDA under an Emergency Use Authorization (EUA). This EUA will remain in effect (meaning this test can be used) for the duration of the COVID-19 declaration under Section 564(b)(1) of the Act, 21 U.S.C. section 360bbb-3(b)(1), unless the authorization is terminated or revoked.  Performed at [facility], [address], [place], [place] [address]    Today   Subjective    Terrance Miller today has  no new  complaints -Eating and drinking okay -No hypoxia        No fever  Or chills    Patient has been seen and examined prior to discharge   Objective   Blood pressure (!) 154/89, pulse (!) 56, temperature 97.7 F (36.5 C), temperature source Oral, resp. rate 16, height 5\' 6"  (1.676 m), weight 96.5 kg, SpO2 98%.   Intake/Output Summary (Last 24 hours) at 01/05/2023 1149 Last data filed at 01/05/2023 0932 Gross per 24 hour  Intake 859.09 ml  Output 900 ml  Net -40.91 ml   Exam Gen:- Awake Alert, in no acute distress  HEENT:- [place].AT, No sclera icterus Neck-Supple Neck,No JVD,.  Lungs-improved air movement, no wheezing  CV- S1, S2 normal, RRR Abd-  +ve B.Sounds, Abd Soft, No tenderness,    Extremity-  good pedal pulses  Psych-affect is appropriate, , underlying cognitive and memory deficits consistent with dementia Neuro-generalized weakness, no new focal deficits, no tremors GU-chronic indwelling Foley   Data Review   CBC w Diff:  Lab Results  Component Value Date   WBC 2.8 (L) 01/05/2023   HGB 8.9 (L) 01/05/2023   HGB 9.7 (L) 11/04/2021   HCT 26.7 (L) 01/05/2023   HCT 28.6 (L) 11/04/2021   PLT 61 (L) 01/05/2023   PLT 124 (L) 11/04/2021   LYMPHOPCT 19 12/31/2022   BANDSPCT 1 07/29/2022   MONOPCT 14 12/31/2022   EOSPCT 0 12/31/2022   BASOPCT 1 12/31/2022   CMP:  Lab Results  Component Value Date   NA 142 01/05/2023   NA 150 (H) 11/04/2021   K 4.1 01/05/2023   CL 112 (H) 01/05/2023   CO2 22 01/05/2023   BUN 38 (H) 01/05/2023   BUN 16 11/04/2021   CREATININE 2.14 (H) 01/05/2023   CREATININE 1.26 09/28/2013   PROT 7.1 12/26/2022   PROT 6.5 11/04/2021   ALBUMIN 3.4 (L) 12/26/2022   ALBUMIN 3.5 (L) 11/04/2021   BILITOT 1.5 (H) 12/26/2022   BILITOT 1.2 11/04/2021   ALKPHOS 74 12/26/2022   AST 27 12/26/2022   ALT 24 12/26/2022   Total Discharge time is about 33 minutes  Shon Hale M.D on 01/05/2023 at 11:49 AM  Go to [email or web address] -  for contact  info  [facility] - Office  [phone]

## 2023-01-05 NOTE — Patient Outreach (Signed)
Care Coordination   Initial Visit Note   01/05/2023  Name: Terrance Miller MRN: [id number] DOB: 02/04/1928  Terrance Miller is a 89 y.o. year old male who sees Terrance Sams, DO for primary care. I spoke with patient's daughter, Terrance Miller by phone today.  What matters to the patients health and wellness today?  Assist with Pursuing Long-Term Care Placement Options.    Goals Addressed             This Visit's Progress    Assist with Pursuing Long-Term Care Placement Options.   On track    Care Coordination Interventions.  Interventions Today    Flowsheet Row Most Recent Value  Chronic Disease   Chronic disease during today's visit Hypertension (HTN), Diabetes, Other, Chronic Kidney Disease/End Stage Renal Disease (ESRD)  [Vascular Dementia Without Behavioral Disturbance, Acute Metabolic Encephalopathy, Chronic Indwelling Catheter, Hyperlipidemia, Peripheral Edema, Glaucoma, Financial Insecurities, Inability to Perform Activities of Daily Living Independently.]  General Interventions   General Interventions Discussed/Reviewed General Interventions Discussed, Labs, Vaccines, Doctor Visits, Communication with, Level of Care, [employer], Horticulturist, commercial (DME), Annual Eye Exam, General Interventions Reviewed, Annual Foot Exam, Health Screening  [Encouraged Routine Engagement with Care Team Members & Providers.]  Labs --  [Encouraged Routine Labwork.]  Vaccines COVID-19, Flu, Pneumonia, RSV, Shingles, Tetanus/Pertussis/Diphtheria  [Encouraged Annual Vaccinations.]  Doctor Visits Discussed/Reviewed Specialist, Doctor Visits Reviewed, Doctor Visits Discussed, Annual Wellness Visits, PCP  [Encouraged Routine Engagement with Care Team Members & Providers.]  Health Screening Colonoscopy, Bone Density, Prostate  [Encouraged Annual Health Screenings.]  Durable Medical Equipment (DME) Other, BP Cuff, Glucomoter, Environmental consultant, Industrial/product designer, [employer], Scales, Printmaker in Air traffic controller, Paediatric nurse with Back & Reacher.]  Wheelchair Standard  PCP/Specialist Visits Compliance with follow-up visit  [Encouraged Routine Engagement with Care Team Members & Providers.]  Communication with PCP/Specialists, Charity fundraiser, Pharmacists, Social Work  [employer] Routine Engagement with Care Team Members & Providers.]  Level of Care Adult Daycare, Air traffic controller, Assisted Living, Skilled Nursing Facility  [Confirmed Disinterest in Enrollment in Adult Day Care Program. Confirmed Interest in Enrollment in Higher Level of Care (I.e Skilled Nursing Facility Placement for [employer]).]  Applications Medicaid, Personal Care Services  [facility] Active [place] Access Adult Medicaid Status. Confirmed Interest in Applying for Personal Care Services, Mailing Application, Offering Assistance with Completion & Submission.]  Exercise Interventions   Exercise Discussed/Reviewed Exercise Discussed, Assistive device use and maintanence, Weight Managment, Physical Activity, Exercise Reviewed  [Encouraged Participation in Daily Exercise Regimen.]  Physical Activity Discussed/Reviewed Physical Activity Discussed, Home Exercise Program (HEP), PREP, Physical Activity Reviewed, Gym, Types of exercise  [Encouraged Increased Level of Activity & Exercise, Inside & Outside the Home, as Tolerated.]  Weight Management Weight loss  [Encouraged Healthy Weight Loss Program.]  Education Interventions   Education Provided Provided Therapist, sports, Provided Web-based Education, Provided Education  [employer] Reviewed Educational Material & Encouraged Implementation.]  Provided Verbal Education On Nutrition, Mental Health/Coping with Illness, When to see the doctor, Foot Care, Eye Care, Labs, Blood Sugar Monitoring, Applications, [employer], Exercise, Medication, Development worker, community  [employer] Consideration of Educational Material Reviewed.]  Ship broker, Personal Care Services   [employer] Active [place] Access Adult Medicaid Status. Confirmed Interest in Applying for Personal Care Services, Mailing Application, Offering Assistance with Completion & Submission.]  Mental Health Interventions   Mental Health Discussed/Reviewed Mental Health Discussed, Anxiety, Mental Health Reviewed, Grief and Loss, Depression, Substance Abuse, Coping Strategies, Suicide, Crisis, Other  [Assessed Mental Health & Cognitive Status.]  Nutrition  Interventions   Nutrition Discussed/Reviewed Nutrition Discussed, Nutrition Reviewed, Carbohydrate meal planning, Increasing proteins, Adding fruits and vegetables, Decreasing fats, Fluid intake, Decreasing salt, Portion sizes, Decreasing sugar intake  [Encouraged Heart-Healthy, Diabetic & Renal-Friendly, Low Fat, Reduced Sugar, Low Sodium Diet.]  Pharmacy Interventions   Pharmacy Dicussed/Reviewed Pharmacy Topics Discussed, Medications and their functions, Pharmacy Topics Reviewed, Medication Adherence, Affording Medications  [Confirmed Ability to Afford Prescription Medications.]  Medication Adherence --  [Confirmed Compliance with Prescription Medications.]  Safety Interventions   Safety Discussed/Reviewed Safety Discussed, Safety Reviewed, Fall Risk, Home Safety  [Encouraged Consideration of Home Safety Evaluation.]  Home Safety Assistive Devices, Need for home safety assessment  [Encouraged Routine Use of Assistive Devices & Durable Medical Equipment.]  Advanced Directive Interventions   Advanced Directives Discussed/Reviewed Advanced Directives Discussed, Advanced Directives Reviewed  [Encouraged Initiation of Advanced Directives (Living Will & Healthcare Power of Corporate treasurer), Offering to [employer], Assist with Completion, Make Copies & Scan into Electronic Medical Record in Epic.]      Screened for Signs & Symptoms of Depression, Related to Chronic Disease State.  PHQ2 & PHQ9 Depression Screen Completed & Results Reviewed.  Suicidal  Ideation & Homicidal Ideation Assessed - None Present.   Domestic Violence Assessed - None Present. Access to Weapons Assessed - None Present.   Active Listening & Reflection Utilized.  Verbalization of Feelings Encouraged.  Emotional Support Provided. Feelings of Caregiver Burnout Validated. Caregiver Stress Acknowledged. Caregiver Resources Reviewed. Caregiver Support Groups Mailed. Self-Enrollment in Caregiver Support Group of Interest Emphasized. Crisis Support Information, Agencies, Services & Resources Discussed. Problem Solving Interventions Identified. Task-Centered Solutions Implemented.   Solution-Focused Strategies Developed. Acceptance & Commitment Therapy Introduced. Brief Cognitive Behavioral Therapy Initiated. Client-Centered Therapy Enacted. Reviewed Prescription Medications & Discussed Importance of Compliance. Quality of Sleep Assessed & Sleep Hygiene Techniques Promoted. CSW Collaboration with Daughter, Terrance Miller to Confirm Discharge from [facility] on 01/05/2023 & Transfer to [facility] [phone]), to Receive Short-Term Rehabilitative Services. CSW Collaboration with Daughter, Terrance Miller to Encourage Routine Engagement with [name], Licensed Clinical Social Worker with [facility] [phone]), If You Have Questions, Need Assistance, or If Additional Social Work Needs Are Identified Between Now & Our Next Follow-Up Outreach Call, Scheduled on 01/14/2023 at 11:15 AM.        SDOH assessments and interventions completed:  Yes.  SDOH Interventions Today    Flowsheet Row Most Recent Value  SDOH Interventions   Food Insecurity Interventions Intervention Not Indicated  Housing Interventions Intervention Not Indicated  Transportation Interventions Intervention Not Indicated, Community Resources Provided, Patient Resources (Friends/Family), Payor Benefit  Utilities Interventions  Intervention Not Indicated  Alcohol Usage Interventions Intervention Not Indicated (Score <7)  Financial Strain Interventions Intervention Not Indicated  Physical Activity Interventions Patient Declined  Stress Interventions Intervention Not Indicated  Social Connections Interventions Intervention Not Indicated, Patient Declined, Community Resources Provided  Health Literacy Interventions Intervention Not Indicated     Care Coordination Interventions:  Yes, provided.   Follow up plan: Follow up call scheduled for 01/14/2023 at 11:15 am.  Encounter Outcome:  Patient Visit Completed.   [name], BSW, MSW, Printmaker Social Work Case Set designer Health  [facility], Population Health Direct Dial: [phone]  Fax: [phone] Email: [email or web address]

## 2023-01-05 NOTE — Progress Notes (Signed)
Palliative:   Chart review completed.  [name] is to discharge to short-term rehab today at [facility].  Face-to-face conference with bedside nursing staff, no needs identified at this time.  [name] is discharging.  Face-to-face conference with bedside nursing staff related to patient condition, needs, disposition.  Plan: Continue to treat the treatable but no CPR or intubation.  Short-term rehab at [facility] with ultimate goal of returning to his daughter Terrance Miller's home.  No charge Lillia Carmel, NP Palliative Medicine Team  Team Phone [phone]

## 2023-01-05 NOTE — Plan of Care (Signed)
  Problem: Education: Goal: Knowledge of General Education information will improve Description: Including pain rating scale, medication(s)/side effects and non-pharmacologic comfort measures Outcome: Adequate for Discharge   Problem: Health Behavior/Discharge Planning: Goal: Ability to manage health-related needs will improve Outcome: Adequate for Discharge   Problem: Activity: Goal: Risk for activity intolerance will decrease Outcome: Adequate for Discharge   Problem: Nutrition: Goal: Adequate nutrition will be maintained Outcome: Adequate for Discharge   Problem: Elimination: Goal: Will not experience complications related to bowel motility Outcome: Adequate for Discharge   Problem: Clinical Measurements: Goal: Ability to maintain clinical measurements within normal limits will improve Outcome: Completed/Met Goal: Will remain free from infection Outcome: Completed/Met Goal: Diagnostic test results will improve Outcome: Completed/Met Goal: Respiratory complications will improve Outcome: Completed/Met Goal: Cardiovascular complication will be avoided Outcome: Completed/Met   Problem: Coping: Goal: Level of anxiety will decrease Outcome: Completed/Met   Problem: Elimination: Goal: Will not experience complications related to urinary retention Outcome: Completed/Met   Problem: Pain Management: Goal: General experience of comfort will improve Outcome: Completed/Met   Problem: Safety: Goal: Ability to remain free from injury will improve Outcome: Completed/Met   Problem: Skin Integrity: Goal: Risk for impaired skin integrity will decrease Outcome: Completed/Met

## 2023-01-05 NOTE — Discharge Instructions (Addendum)
1)Avoid ibuprofen/Advil/Aleve/Motrin/Goody Powders/Naproxen/BC powders/Meloxicam/Diclofenac/Indomethacin and other Nonsteroidal anti-inflammatory medications as these will make you more likely to bleed and can cause stomach ulcers, can also cause Kidney problems.   2)Repeat CBC and BMP Blood Tests on Friday 01/09/23  3)Patient has a Coude (Foley) Catheter--- placed 12/19/22  --routine Foley catheter care advised, change prn -Outpatient urology follow-up with Dr. Ronne Binning advised

## 2023-01-06 ENCOUNTER — Other Ambulatory Visit: Payer: Self-pay | Admitting: *Deleted

## 2023-01-06 NOTE — Transitions of Care (Post Inpatient/ED Visit) (Signed)
   01/06/2023  Name: Terrance Miller MRN: [id number] DOB: 09-26-1928  Per collaboration with LCSW Joanna Saporito, pt had hospital readmission and upon discharge transferred to [facility] for short term rehab.  Case closure  Mliss Creed [facility], BSN RN Care Manager/ Transition of Care [place]/ [facility] [phone]

## 2023-01-07 DIAGNOSIS — H409 Unspecified glaucoma: Secondary | ICD-10-CM | POA: Diagnosis not present

## 2023-01-07 DIAGNOSIS — I2510 Atherosclerotic heart disease of native coronary artery without angina pectoris: Secondary | ICD-10-CM | POA: Diagnosis not present

## 2023-01-07 DIAGNOSIS — R339 Retention of urine, unspecified: Secondary | ICD-10-CM | POA: Diagnosis not present

## 2023-01-07 DIAGNOSIS — M6281 Muscle weakness (generalized): Secondary | ICD-10-CM | POA: Diagnosis not present

## 2023-01-07 DIAGNOSIS — I10 Essential (primary) hypertension: Secondary | ICD-10-CM | POA: Diagnosis not present

## 2023-01-08 DIAGNOSIS — I2510 Atherosclerotic heart disease of native coronary artery without angina pectoris: Secondary | ICD-10-CM | POA: Diagnosis not present

## 2023-01-08 DIAGNOSIS — E785 Hyperlipidemia, unspecified: Secondary | ICD-10-CM | POA: Diagnosis not present

## 2023-01-08 DIAGNOSIS — D649 Anemia, unspecified: Secondary | ICD-10-CM | POA: Diagnosis not present

## 2023-01-08 DIAGNOSIS — H409 Unspecified glaucoma: Secondary | ICD-10-CM | POA: Diagnosis not present

## 2023-01-08 DIAGNOSIS — D472 Monoclonal gammopathy: Secondary | ICD-10-CM | POA: Diagnosis not present

## 2023-01-09 DIAGNOSIS — I2510 Atherosclerotic heart disease of native coronary artery without angina pectoris: Secondary | ICD-10-CM | POA: Diagnosis not present

## 2023-01-09 DIAGNOSIS — I10 Essential (primary) hypertension: Secondary | ICD-10-CM | POA: Diagnosis not present

## 2023-01-09 DIAGNOSIS — R339 Retention of urine, unspecified: Secondary | ICD-10-CM | POA: Diagnosis not present

## 2023-01-09 DIAGNOSIS — D691 Qualitative platelet defects: Secondary | ICD-10-CM | POA: Diagnosis not present

## 2023-01-09 DIAGNOSIS — M6281 Muscle weakness (generalized): Secondary | ICD-10-CM | POA: Diagnosis not present

## 2023-01-09 DIAGNOSIS — R7309 Other abnormal glucose: Secondary | ICD-10-CM | POA: Diagnosis not present

## 2023-01-09 DIAGNOSIS — H409 Unspecified glaucoma: Secondary | ICD-10-CM | POA: Diagnosis not present

## 2023-01-12 DIAGNOSIS — I10 Essential (primary) hypertension: Secondary | ICD-10-CM | POA: Diagnosis not present

## 2023-01-12 DIAGNOSIS — R339 Retention of urine, unspecified: Secondary | ICD-10-CM | POA: Diagnosis not present

## 2023-01-12 DIAGNOSIS — I2510 Atherosclerotic heart disease of native coronary artery without angina pectoris: Secondary | ICD-10-CM | POA: Diagnosis not present

## 2023-01-12 DIAGNOSIS — H409 Unspecified glaucoma: Secondary | ICD-10-CM | POA: Diagnosis not present

## 2023-01-12 DIAGNOSIS — M6281 Muscle weakness (generalized): Secondary | ICD-10-CM | POA: Diagnosis not present

## 2023-01-14 ENCOUNTER — Ambulatory Visit: Payer: Self-pay | Admitting: *Deleted

## 2023-01-14 DIAGNOSIS — H409 Unspecified glaucoma: Secondary | ICD-10-CM | POA: Diagnosis not present

## 2023-01-14 DIAGNOSIS — R339 Retention of urine, unspecified: Secondary | ICD-10-CM | POA: Diagnosis not present

## 2023-01-14 DIAGNOSIS — N183 Chronic kidney disease, stage 3 unspecified: Secondary | ICD-10-CM | POA: Diagnosis not present

## 2023-01-14 DIAGNOSIS — E11628 Type 2 diabetes mellitus with other skin complications: Secondary | ICD-10-CM | POA: Diagnosis not present

## 2023-01-14 DIAGNOSIS — M6281 Muscle weakness (generalized): Secondary | ICD-10-CM | POA: Diagnosis not present

## 2023-01-14 DIAGNOSIS — I2510 Atherosclerotic heart disease of native coronary artery without angina pectoris: Secondary | ICD-10-CM | POA: Diagnosis not present

## 2023-01-14 DIAGNOSIS — L89152 Pressure ulcer of sacral region, stage 2: Secondary | ICD-10-CM | POA: Diagnosis not present

## 2023-01-14 DIAGNOSIS — I10 Essential (primary) hypertension: Secondary | ICD-10-CM | POA: Diagnosis not present

## 2023-01-14 NOTE — Patient Outreach (Signed)
 Care Coordination   Follow Up Visit Note   01/14/2023  Name: Terrance Miller MRN: [id number] DOB: 02-09-1928  Terrance Miller is a 89 y.o. year old male who sees Cook, [name], DO for primary care. I spoke with patient's daughter, [name] Pepper by phone today.  What matters to the patients health and wellness today?   Assist with Pursuing Long-Term Care Placement Options.   Goals Addressed             This Visit's Progress    Assist with Pursuing Long-Term Care Placement Options.   On track    Care Coordination Interventions.  Interventions Today    Flowsheet Row Most Recent Value  Chronic Disease   Chronic disease during today's visit Hypertension (HTN), Diabetes, Other, Chronic Kidney Disease/End Stage Renal Disease (ESRD)  [Vascular Dementia Without Behavioral Disturbance, Acute Metabolic Encephalopathy, Chronic Indwelling Catheter, Hyperlipidemia, Peripheral Edema, Glaucoma, Financial Insecurities, Inability to Perform Activities of Daily Living Independently.]  General Interventions   General Interventions Discussed/Reviewed General Interventions Discussed, Labs, Vaccines, Doctor Visits, Communication with, Level of Care, [employer], Horticulturist, Commercial (DME), Annual Eye Exam, General Interventions Reviewed, Annual Foot Exam, Health Screening  [Encouraged Routine Engagement with Care Team Members & Providers.]  Labs --  [Encouraged Routine Labwork.]  Vaccines COVID-19, Flu, Pneumonia, RSV, Shingles, Tetanus/Pertussis/Diphtheria  [Encouraged Annual Vaccinations.]  Doctor Visits Discussed/Reviewed Specialist, Doctor Visits Reviewed, Doctor Visits Discussed, Annual Wellness Visits, PCP  [Encouraged Routine Engagement with Care Team Members & Providers.]  Health Screening Colonoscopy, Bone Density, Prostate  [Encouraged Annual Health Screenings.]  Durable Medical Equipment (DME) Other, BP Cuff, Glucomoter, Environmental Consultant, Industrial/product Designer, [employer], Scales, Printmaker in Air Traffic Controller, Paediatric Nurse with Back & Reacher.]  Wheelchair Standard  PCP/Specialist Visits Compliance with follow-up visit  [Encouraged Routine Engagement with Care Team Members & Providers.]  Communication with PCP/Specialists, CHARITY FUNDRAISER, Pharmacists, Social Work  [employer] Routine Engagement with Care Team Members & Providers.]  Level of Care Adult Daycare, Air Traffic Controller, Assisted Living, Skilled Nursing Facility  [Confirmed Disinterest in Enrollment in Adult Day Care Program. Confirmed Interest in Enrollment in Higher Level of Care (I.e Skilled Nursing Facility Placement for [employer]).]  Applications Medicaid, Personal Care Services  [facility] Active [place] Access Adult Medicaid Status. Confirmed Interest in Applying for Personal Care Services, Mailing Application, Offering Assistance with Completion & Submission.]  Exercise Interventions   Exercise Discussed/Reviewed Exercise Discussed, Assistive device use and maintanence, Weight Managment, Physical Activity, Exercise Reviewed  [Encouraged Participation in Daily Exercise Regimen.]  Physical Activity Discussed/Reviewed Physical Activity Discussed, Home Exercise Program (HEP), PREP, Physical Activity Reviewed, Gym, Types of exercise  [Encouraged Increased Level of Activity & Exercise, Inside & Outside the Home, as Tolerated.]  Weight Management Weight loss  [Encouraged Healthy Weight Loss Program.]  Education Interventions   Education Provided Provided Therapist, Sports, Provided Web-based Education, Provided Education  [employer] Reviewed Educational Material & Encouraged Implementation.]  Provided Verbal Education On Nutrition, Mental Health/Coping with Illness, When to see the doctor, Foot Care, Eye Care, Labs, Blood Sugar Monitoring, Applications, [employer], Exercise, Medication, Development Worker, Community  [employer] Consideration of Educational Material Reviewed.]  Ship Broker, Personal Care Services   [employer] Active [place] Access Adult Medicaid Status. Confirmed Interest in Applying for Personal Care Services, Mailing Application, Offering Assistance with Completion & Submission.]  Mental Health Interventions   Mental Health Discussed/Reviewed Mental Health Discussed, Anxiety, Mental Health Reviewed, Grief and Loss, Depression, Substance Abuse, Coping Strategies, Suicide, Crisis, Other  [Assessed Mental Health & Cognitive Status.]  Nutrition Interventions   Nutrition Discussed/Reviewed Nutrition Discussed, Nutrition Reviewed, Carbohydrate meal planning, Increasing proteins, Adding fruits and vegetables, Decreasing fats, Fluid intake, Decreasing salt, Portion sizes, Decreasing sugar intake  [Encouraged Heart-Healthy, Diabetic & Renal-Friendly, Low Fat, Reduced Sugar, Low Sodium Diet.]  Pharmacy Interventions   Pharmacy Dicussed/Reviewed Pharmacy Topics Discussed, Medications and their functions, Pharmacy Topics Reviewed, Medication Adherence, Affording Medications  [Confirmed Ability to Afford Prescription Medications.]  Medication Adherence --  [Confirmed Compliance with Prescription Medications.]  Safety Interventions   Safety Discussed/Reviewed Safety Discussed, Safety Reviewed, Fall Risk, Home Safety  [Encouraged Consideration of Home Safety Evaluation.]  Home Safety Assistive Devices, Need for home safety assessment  [Encouraged Routine Use of Assistive Devices & Durable Medical Equipment.]  Advanced Directive Interventions   Advanced Directives Discussed/Reviewed Advanced Directives Discussed, Advanced Directives Reviewed  [Encouraged Initiation of Advanced Directives (Living Will & Healthcare Power of Corporate Treasurer), Offering to [employer], Assist with Completion, Make Copies & Scan into Electronic Medical Record in Epic.]      Active Listening & Reflection Utilized.  Verbalization of Feelings Encouraged.  Emotional Support Provided. Problem Solving Interventions  Activated. Task-Centered Solutions Employed.   Solution-Focused Strategies Implemented. Acceptance & Commitment Therapy Initiated. Cognitive Behavioral Therapy Performed. Client-Centered Therapy Conducted. CSW Collaboration with Daughter, [name] Pepper to Confirm Patient's Continued Residency at [facility] [phone]), to Receive Short-Term Rehabilitative Services. CSW Collaboration with Daughter, [name] Pepper to Encourage Routine Engagement with Philippe Desanctis, Licensed Clinical Social Worker with [facility] [phone]), If You Have Questions, Need Assistance, or If Additional Social Work Needs Are Identified Between Now & Our Next Follow-Up Outreach Call, Scheduled on 02/04/2023 at 10:30 AM.      SDOH assessments and interventions completed:  Yes.  Care Coordination Interventions:  Yes, provided.   Follow up plan: Follow up call scheduled for 02/04/2023 at 10:30 am.  Encounter Outcome:  Patient Visit Completed.   Philippe Desanctis, BSW, MSW, Printmaker Social Work Case Set Designer Health  [facility], Population Health Direct Dial: [phone]  Fax: [phone] Email: [email or web address] Website: [email or web address]

## 2023-01-14 NOTE — Patient Instructions (Signed)
 Visit Information  Thank you for taking time to visit with me today. Please don't hesitate to contact me if I can be of assistance to you.   Following are the goals we discussed today:   Goals Addressed             This Visit's Progress    Assist with Pursuing Long-Term Care Placement Options.   On track    Care Coordination Interventions.  Interventions Today    Flowsheet Row Most Recent Value  Chronic Disease   Chronic disease during today's visit Hypertension (HTN), Diabetes, Other, Chronic Kidney Disease/End Stage Renal Disease (ESRD)  [Vascular Dementia Without Behavioral Disturbance, Acute Metabolic Encephalopathy, Chronic Indwelling Catheter, Hyperlipidemia, Peripheral Edema, Glaucoma, Financial Insecurities, Inability to Perform Activities of Daily Living Independently.]  General Interventions   General Interventions Discussed/Reviewed General Interventions Discussed, Labs, Vaccines, Doctor Visits, Communication with, Level of Care, [employer], Horticulturist, Commercial (DME), Annual Eye Exam, General Interventions Reviewed, Annual Foot Exam, Health Screening  [Encouraged Routine Engagement with Care Team Members & Providers.]  Labs --  [Encouraged Routine Labwork.]  Vaccines COVID-19, Flu, Pneumonia, RSV, Shingles, Tetanus/Pertussis/Diphtheria  [Encouraged Annual Vaccinations.]  Doctor Visits Discussed/Reviewed Specialist, Doctor Visits Reviewed, Doctor Visits Discussed, Annual Wellness Visits, PCP  [Encouraged Routine Engagement with Care Team Members & Providers.]  Health Screening Colonoscopy, Bone Density, Prostate  [Encouraged Annual Health Screenings.]  Durable Medical Equipment (DME) Other, BP Cuff, Glucomoter, Environmental Consultant, Industrial/product Designer, [employer], Scales, Engineer, Materials in Air Traffic Controller, Paediatric Nurse with Back & Reacher.]  Wheelchair Standard  PCP/Specialist Visits Compliance with follow-up visit  [Encouraged Routine Engagement with Care Team Members &  Providers.]  Communication with PCP/Specialists, CHARITY FUNDRAISER, Pharmacists, Social Work  [employer] Routine Engagement with Care Team Members & Providers.]  Level of Care Adult Daycare, Air Traffic Controller, Assisted Living, Skilled Nursing Facility  [Confirmed Disinterest in Enrollment in Adult Day Care Program. Confirmed Interest in Enrollment in Higher Level of Care (I.e Skilled Nursing Facility Placement for [employer]).]  Applications Medicaid, Personal Care Services  [facility] Active [place] Access Adult Medicaid Status. Confirmed Interest in Applying for Personal Care Services, Mailing Application, Offering Assistance with Completion & Submission.]  Exercise Interventions   Exercise Discussed/Reviewed Exercise Discussed, Assistive device use and maintanence, Weight Managment, Physical Activity, Exercise Reviewed  [Encouraged Participation in Daily Exercise Regimen.]  Physical Activity Discussed/Reviewed Physical Activity Discussed, Home Exercise Program (HEP), PREP, Physical Activity Reviewed, Gym, Types of exercise  [Encouraged Increased Level of Activity & Exercise, Inside & Outside the Home, as Tolerated.]  Weight Management Weight loss  [Encouraged Healthy Weight Loss Program.]  Education Interventions   Education Provided Provided Therapist, Sports, Provided Web-based Education, Provided Education  [employer] Reviewed Educational Material & Encouraged Implementation.]  Provided Verbal Education On Nutrition, Mental Health/Coping with Illness, When to see the doctor, Foot Care, Eye Care, Labs, Blood Sugar Monitoring, Applications, [employer], Exercise, Medication, Development Worker, Community  [employer] Consideration of Educational Material Reviewed.]  Ship Broker, Personal Care Services  [employer] Active [place] Access Adult Medicaid Status. Confirmed Interest in Applying for Personal Care Services, Mailing Application, Offering Assistance with Completion & Submission.]   Mental Health Interventions   Mental Health Discussed/Reviewed Mental Health Discussed, Anxiety, Mental Health Reviewed, Grief and Loss, Depression, Substance Abuse, Coping Strategies, Suicide, Crisis, Other  [Assessed Mental Health & Cognitive Status.]  Nutrition Interventions   Nutrition Discussed/Reviewed Nutrition Discussed, Nutrition Reviewed, Carbohydrate meal planning, Increasing proteins, Adding fruits and vegetables, Decreasing fats, Fluid intake, Decreasing salt, Portion sizes, Decreasing sugar intake  [  Encouraged Heart-Healthy, Diabetic & Renal-Friendly, Low Fat, Reduced Sugar, Low Sodium Diet.]  Pharmacy Interventions   Pharmacy Dicussed/Reviewed Pharmacy Topics Discussed, Medications and their functions, Pharmacy Topics Reviewed, Medication Adherence, Affording Medications  [Confirmed Ability to Afford Prescription Medications.]  Medication Adherence --  [Confirmed Compliance with Prescription Medications.]  Safety Interventions   Safety Discussed/Reviewed Safety Discussed, Safety Reviewed, Fall Risk, Home Safety  [Encouraged Consideration of Home Safety Evaluation.]  Home Safety Assistive Devices, Need for home safety assessment  [Encouraged Routine Use of Assistive Devices & Durable Medical Equipment.]  Advanced Directive Interventions   Advanced Directives Discussed/Reviewed Advanced Directives Discussed, Advanced Directives Reviewed  [Encouraged Initiation of Advanced Directives (Living Will & Healthcare Power of Corporate Treasurer), Offering to [employer], Assist with Completion, Make Copies & Scan into Electronic Medical Record in Epic.]      Active Listening & Reflection Utilized.  Verbalization of Feelings Encouraged.  Emotional Support Provided. Problem Solving Interventions Activated. Task-Centered Solutions Employed.   Solution-Focused Strategies Implemented. Acceptance & Commitment Therapy Initiated. Cognitive Behavioral Therapy Performed. Client-Centered Therapy  Conducted. CSW Collaboration with Daughter, [name] Pepper to Confirm Patient's Continued Residency at [facility] [phone]), to Receive Short-Term Rehabilitative Services. CSW Collaboration with Daughter, [name] Pepper to Encourage Routine Engagement with Philippe Desanctis, Licensed Clinical Social Worker with [facility] [phone]), If You Have Questions, Need Assistance, or If Additional Social Work Needs Are Identified Between Now & Our Next Follow-Up Outreach Call, Scheduled on 02/04/2023 at 10:30 AM.      Our next appointment is by telephone on 02/04/2023 at 10:30 am.  Please call the care guide team at [phone] if you need to cancel or reschedule your appointment.   If you are experiencing a Mental Health or Behavioral Health Crisis or need someone to talk to, please call the Suicide and Crisis Lifeline: 988 call the [place]  National Suicide Prevention Lifeline: (684)436-8095 or TTY: 418-079-6311 TTY 941-603-7263) to talk to a trained counselor call 1-800-273-TALK (toll free, 24 hour hotline) go to [facility] [address], [place] [phone]) call the [facility] Crisis Line: ([phone])117-9872 call 911  Patient verbalizes understanding of instructions and care plan provided today and agrees to view in MyChart. Active MyChart status and patient understanding of how to access instructions and care plan via MyChart confirmed with patient.     Telephone follow up appointment with care management team member scheduled for:  02/04/2023 at 10:30 am.  Philippe Desanctis, BSW, MSW, LCSW  Embedded Practice Social Work Case Manager  [facility], Population Health Direct Dial: [phone]  Fax: [phone] Email: [email or web address] Website: [email or web address]

## 2023-01-16 DIAGNOSIS — I2510 Atherosclerotic heart disease of native coronary artery without angina pectoris: Secondary | ICD-10-CM | POA: Diagnosis not present

## 2023-01-16 DIAGNOSIS — R339 Retention of urine, unspecified: Secondary | ICD-10-CM | POA: Diagnosis not present

## 2023-01-16 DIAGNOSIS — H409 Unspecified glaucoma: Secondary | ICD-10-CM | POA: Diagnosis not present

## 2023-01-16 DIAGNOSIS — M6281 Muscle weakness (generalized): Secondary | ICD-10-CM | POA: Diagnosis not present

## 2023-01-16 DIAGNOSIS — I10 Essential (primary) hypertension: Secondary | ICD-10-CM | POA: Diagnosis not present

## 2023-01-19 DIAGNOSIS — I10 Essential (primary) hypertension: Secondary | ICD-10-CM | POA: Diagnosis not present

## 2023-01-19 DIAGNOSIS — I2510 Atherosclerotic heart disease of native coronary artery without angina pectoris: Secondary | ICD-10-CM | POA: Diagnosis not present

## 2023-01-19 DIAGNOSIS — H409 Unspecified glaucoma: Secondary | ICD-10-CM | POA: Diagnosis not present

## 2023-01-19 DIAGNOSIS — R339 Retention of urine, unspecified: Secondary | ICD-10-CM | POA: Diagnosis not present

## 2023-01-19 DIAGNOSIS — M6281 Muscle weakness (generalized): Secondary | ICD-10-CM | POA: Diagnosis not present

## 2023-01-21 DIAGNOSIS — R339 Retention of urine, unspecified: Secondary | ICD-10-CM | POA: Diagnosis not present

## 2023-01-21 DIAGNOSIS — H409 Unspecified glaucoma: Secondary | ICD-10-CM | POA: Diagnosis not present

## 2023-01-21 DIAGNOSIS — I10 Essential (primary) hypertension: Secondary | ICD-10-CM | POA: Diagnosis not present

## 2023-01-21 DIAGNOSIS — I2510 Atherosclerotic heart disease of native coronary artery without angina pectoris: Secondary | ICD-10-CM | POA: Diagnosis not present

## 2023-01-21 DIAGNOSIS — E11628 Type 2 diabetes mellitus with other skin complications: Secondary | ICD-10-CM | POA: Diagnosis not present

## 2023-01-21 DIAGNOSIS — M6281 Muscle weakness (generalized): Secondary | ICD-10-CM | POA: Diagnosis not present

## 2023-01-21 DIAGNOSIS — N183 Chronic kidney disease, stage 3 unspecified: Secondary | ICD-10-CM | POA: Diagnosis not present

## 2023-01-21 DIAGNOSIS — L89153 Pressure ulcer of sacral region, stage 3: Secondary | ICD-10-CM | POA: Diagnosis not present

## 2023-01-26 ENCOUNTER — Telehealth: Payer: Self-pay

## 2023-01-28 ENCOUNTER — Ambulatory Visit: Payer: Medicare Other | Admitting: Podiatry

## 2023-01-28 ENCOUNTER — Ambulatory Visit: Payer: Medicare Other

## 2023-01-30 NOTE — Telephone Encounter (Signed)
Cath change changed to voiding trial.

## 2023-02-04 ENCOUNTER — Ambulatory Visit: Payer: Self-pay | Admitting: *Deleted

## 2023-02-04 NOTE — Patient Outreach (Signed)
Care Coordination   Follow Up Visit Note   02/04/2023  Name: Terrance Miller MRN: [id number] DOB: [birth year]  Terrance Miller is a 89 y.o. year old male who sees Terrance Sams, DO for primary care. I spoke with patient's daughter, Terrance Miller by phone today.  What matters to the patients health and wellness today?  Assist with Pursuing Long-Term Care Placement Options.    Goals Addressed             This Visit's Progress    COMPLETED: Assist with Pursuing Long-Term Care Placement Options.   On track    Care Coordination Interventions.  Interventions Today    Flowsheet Row Most Recent Value  Chronic Disease   Chronic disease during today's visit Hypertension (HTN), Diabetes, Other, Chronic Kidney Disease/End Stage Renal Disease (ESRD)  [Vascular Dementia Without Behavioral Disturbance, Acute Metabolic Encephalopathy, Chronic Indwelling Catheter, Hyperlipidemia, Peripheral Edema, Glaucoma, Financial Insecurities, Inability to Perform Activities of Daily Living Independently.]  General Interventions   General Interventions Discussed/Reviewed General Interventions Discussed, Labs, Vaccines, Doctor Visits, Communication with, Level of Care, [employer], Horticulturist, commercial (DME), Annual Eye Exam, General Interventions Reviewed, Annual Foot Exam, Health Screening  [Encouraged Routine Engagement with Care Team Members & Providers.]  Labs --  [Encouraged Routine Labwork.]  Vaccines COVID-19, Flu, Pneumonia, RSV, Shingles, Tetanus/Pertussis/Diphtheria  [Encouraged Annual Vaccinations.]  Doctor Visits Discussed/Reviewed Specialist, Doctor Visits Reviewed, Doctor Visits Discussed, Annual Wellness Visits, PCP  [Encouraged Routine Engagement with Care Team Members & Providers.]  Health Screening Colonoscopy, Bone Density, Prostate  [Encouraged Annual Health Screenings.]  Durable Medical Equipment (DME) Other, BP Cuff, Glucomoter, Environmental consultant, Industrial/product designer, [employer],  Scales, Engineer, materials in Air traffic controller, Paediatric nurse with Back & Reacher.]  Wheelchair Standard  PCP/Specialist Visits Compliance with follow-up visit  [Encouraged Routine Engagement with Care Team Members & Providers.]  Communication with PCP/Specialists, Charity fundraiser, Pharmacists, Social Work  [employer] Routine Engagement with Care Team Members & Providers.]  Level of Care Adult Daycare, Air traffic controller, Assisted Living, Skilled Nursing Facility  [Confirmed Disinterest in Enrollment in Adult Day Care Program. Confirmed Interest in Enrollment in Higher Level of Care (I.e Skilled Nursing Facility Placement for [employer]).]  Applications Medicaid, Personal Care Services  [facility] Active [place] Access Adult Medicaid Status. Confirmed Interest in Applying for Personal Care Services, Mailing Application, Offering Assistance with Completion & Submission.]  Exercise Interventions   Exercise Discussed/Reviewed Exercise Discussed, Assistive device use and maintanence, Weight Managment, Physical Activity, Exercise Reviewed  [Encouraged Participation in Daily Exercise Regimen.]  Physical Activity Discussed/Reviewed Physical Activity Discussed, Home Exercise Program (HEP), PREP, Physical Activity Reviewed, Gym, Types of exercise  [Encouraged Increased Level of Activity & Exercise, Inside & Outside the Home, as Tolerated.]  Weight Management Weight loss  [Encouraged Healthy Weight Loss Program.]  Education Interventions   Education Provided Provided Therapist, sports, Provided Web-based Education, Provided Education  [employer] Reviewed Educational Material & Encouraged Implementation.]  Provided Verbal Education On Nutrition, Mental Health/Coping with Illness, When to see the doctor, Foot Care, Eye Care, Labs, Blood Sugar Monitoring, Applications, [employer], Exercise, Medication, Development worker, community  [employer] Consideration of Educational Material Reviewed.]  Ship broker, Personal Care  Services  [employer] Active [place] Access Adult Medicaid Status. Confirmed Interest in Applying for Personal Care Services, Mailing Application, Offering Assistance with Completion & Submission.]  Mental Health Interventions   Mental Health Discussed/Reviewed Mental Health Discussed, Anxiety, Mental Health Reviewed, Grief and Loss, Depression, Substance Abuse, Coping Strategies, Suicide, Crisis, Other  [Assessed Mental Health & Cognitive Status.]  Nutrition Interventions   Nutrition Discussed/Reviewed Nutrition Discussed, Nutrition Reviewed, Carbohydrate meal planning, Increasing proteins, Adding fruits and vegetables, Decreasing fats, Fluid intake, Decreasing salt, Portion sizes, Decreasing sugar intake  [Encouraged Heart-Healthy, Diabetic & Renal-Friendly, Low Fat, Reduced Sugar, Low Sodium Diet.]  Pharmacy Interventions   Pharmacy Dicussed/Reviewed Pharmacy Topics Discussed, Medications and their functions, Pharmacy Topics Reviewed, Medication Adherence, Affording Medications  [Confirmed Ability to Afford Prescription Medications.]  Medication Adherence --  [Confirmed Compliance with Prescription Medications.]  Safety Interventions   Safety Discussed/Reviewed Safety Discussed, Safety Reviewed, Fall Risk, Home Safety  [Encouraged Consideration of Home Safety Evaluation.]  Home Safety Assistive Devices, Need for home safety assessment  [Encouraged Routine Use of Assistive Devices & Durable Medical Equipment.]  Advanced Directive Interventions   Advanced Directives Discussed/Reviewed Advanced Directives Discussed, Advanced Directives Reviewed  [Encouraged Initiation of Advanced Directives (Living Will & Healthcare Power of Corporate treasurer), Offering to [employer], Assist with Completion, Make Copies & Scan into Electronic Medical Record in Epic.]      Active Listening & Reflection Utilized.  Verbalization of Feelings Encouraged.  Emotional Support Provided. Problem Solving Interventions  Implemented. CSW Collaboration with Daughter, Terrance Miller to Encourage Engagement with Terrance Miller, Licensed Clinical Social Worker with [facility], [facility] [phone]), If She Has Questions, Needs Assistance, Additional Social Work Needs Are Identified in The Near Future, of She Changes Her Mind About Wanting to Receive Social Work Nurse, adult.       SDOH assessments and interventions completed:  Yes.  Care Coordination Interventions:  Yes, provided.   Follow up plan: No further intervention required.   Encounter Outcome:  Patient Visit Completed.   Terrance Miller, BSW, MSW, LCSW [facility], [facility] Clinical Social Worker II Direct Dial: [phone]  Fax: [phone] Website: [email or web address]

## 2023-02-04 NOTE — Patient Instructions (Signed)
Visit Information  Thank you for taking time to visit with me today. Please don't hesitate to contact me if I can be of assistance to you.   Following are the goals we discussed today:   Goals Addressed             This Visit's Progress    COMPLETED: Assist with Pursuing Long-Term Care Placement Options.   On track    Care Coordination Interventions.  Interventions Today    Flowsheet Row Most Recent Value  Chronic Disease   Chronic disease during today's visit Hypertension (HTN), Diabetes, Other, Chronic Kidney Disease/End Stage Renal Disease (ESRD)  [Vascular Dementia Without Behavioral Disturbance, Acute Metabolic Encephalopathy, Chronic Indwelling Catheter, Hyperlipidemia, Peripheral Edema, Glaucoma, Financial Insecurities, Inability to Perform Activities of Daily Living Independently.]  General Interventions   General Interventions Discussed/Reviewed General Interventions Discussed, Labs, Vaccines, Doctor Visits, Communication with, Level of Care, [employer], Horticulturist, commercial (DME), Annual Eye Exam, General Interventions Reviewed, Annual Foot Exam, Health Screening  [Encouraged Routine Engagement with Care Team Members & Providers.]  Labs --  [Encouraged Routine Labwork.]  Vaccines COVID-19, Flu, Pneumonia, RSV, Shingles, Tetanus/Pertussis/Diphtheria  [Encouraged Annual Vaccinations.]  Doctor Visits Discussed/Reviewed Specialist, Doctor Visits Reviewed, Doctor Visits Discussed, Annual Wellness Visits, PCP  [Encouraged Routine Engagement with Care Team Members & Providers.]  Health Screening Colonoscopy, Bone Density, Prostate  [Encouraged Annual Health Screenings.]  Durable Medical Equipment (DME) Other, BP Cuff, Glucomoter, Environmental consultant, Industrial/product designer, [employer], Scales, Engineer, materials in Air traffic controller, Paediatric nurse with Back & Reacher.]  Wheelchair Standard  PCP/Specialist Visits Compliance with follow-up visit  [Encouraged Routine Engagement with Care Team Members  & Providers.]  Communication with PCP/Specialists, Charity fundraiser, Pharmacists, Social Work  [employer] Routine Engagement with Care Team Members & Providers.]  Level of Care Adult Daycare, Air traffic controller, Assisted Living, Skilled Nursing Facility  [Confirmed Disinterest in Enrollment in Adult Day Care Program. Confirmed Interest in Enrollment in Higher Level of Care (I.e Skilled Nursing Facility Placement for [employer]).]  Applications Medicaid, Personal Care Services  [facility] Active [place] Access Adult Medicaid Status. Confirmed Interest in Applying for Personal Care Services, Mailing Application, Offering Assistance with Completion & Submission.]  Exercise Interventions   Exercise Discussed/Reviewed Exercise Discussed, Assistive device use and maintanence, Weight Managment, Physical Activity, Exercise Reviewed  [Encouraged Participation in Daily Exercise Regimen.]  Physical Activity Discussed/Reviewed Physical Activity Discussed, Home Exercise Program (HEP), PREP, Physical Activity Reviewed, Gym, Types of exercise  [Encouraged Increased Level of Activity & Exercise, Inside & Outside the Home, as Tolerated.]  Weight Management Weight loss  [Encouraged Healthy Weight Loss Program.]  Education Interventions   Education Provided Provided Therapist, sports, Provided Web-based Education, Provided Education  [employer] Reviewed Educational Material & Encouraged Implementation.]  Provided Verbal Education On Nutrition, Mental Health/Coping with Illness, When to see the doctor, Foot Care, Eye Care, Labs, Blood Sugar Monitoring, Applications, [employer], Exercise, Medication, Development worker, community  [employer] Consideration of Educational Material Reviewed.]  Ship broker, Personal Care Services  [employer] Active [place] Access Adult Medicaid Status. Confirmed Interest in Applying for Personal Care Services, Mailing Application, Offering Assistance with Completion & Submission.]   Mental Health Interventions   Mental Health Discussed/Reviewed Mental Health Discussed, Anxiety, Mental Health Reviewed, Grief and Loss, Depression, Substance Abuse, Coping Strategies, Suicide, Crisis, Other  [Assessed Mental Health & Cognitive Status.]  Nutrition Interventions   Nutrition Discussed/Reviewed Nutrition Discussed, Nutrition Reviewed, Carbohydrate meal planning, Increasing proteins, Adding fruits and vegetables, Decreasing fats, Fluid intake, Decreasing salt, Portion sizes, Decreasing sugar intake  [  Encouraged Heart-Healthy, Diabetic & Renal-Friendly, Low Fat, Reduced Sugar, Low Sodium Diet.]  Pharmacy Interventions   Pharmacy Dicussed/Reviewed Pharmacy Topics Discussed, Medications and their functions, Pharmacy Topics Reviewed, Medication Adherence, Affording Medications  [Confirmed Ability to Afford Prescription Medications.]  Medication Adherence --  [Confirmed Compliance with Prescription Medications.]  Safety Interventions   Safety Discussed/Reviewed Safety Discussed, Safety Reviewed, Fall Risk, Home Safety  [Encouraged Consideration of Home Safety Evaluation.]  Home Safety Assistive Devices, Need for home safety assessment  [Encouraged Routine Use of Assistive Devices & Durable Medical Equipment.]  Advanced Directive Interventions   Advanced Directives Discussed/Reviewed Advanced Directives Discussed, Advanced Directives Reviewed  [Encouraged Initiation of Advanced Directives (Living Will & Healthcare Power of Corporate treasurer), Offering to [employer], Assist with Completion, Make Copies & Scan into Electronic Medical Record in Epic.]      Active Listening & Reflection Utilized.  Verbalization of Feelings Encouraged.  Emotional Support Provided. Problem Solving Interventions Implemented. CSW Collaboration with Daughter, [name] to Encourage Engagement with [name], Licensed Clinical Social Worker with [facility], [facility]  [phone]), If She Has Questions, Needs Assistance, Additional Social Work Needs Are Identified in The Near Future, of She Changes Her Mind About Wanting to Receive Social Work Nurse, adult.       Please call the care guide team at [phone] if you need to cancel or reschedule your appointment.   If you are experiencing a Mental Health or Behavioral Health Crisis or need someone to talk to, please call the Suicide and Crisis Lifeline: 988 call the [place] National Suicide Prevention Lifeline: (317)375-1716 or TTY: 478-589-0518 TTY 2131926041) to talk to a trained counselor call 1-800-273-TALK (toll free, 24 hour hotline) go to [facility] [address], [place] [phone]) call the [facility] Crisis Line: (671)090-2920 call 911  Patient verbalizes understanding of instructions and care plan provided today and agrees to view in MyChart. Active MyChart status and patient understanding of how to access instructions and care plan via MyChart confirmed with patient.     No further follow up required.  [name], BSW, MSW, LCSW [facility], [facility] Clinical Social Worker II Direct Dial: [phone]  Fax: [phone] Website: [email or web address]

## 2023-02-05 ENCOUNTER — Ambulatory Visit: Payer: Medicare Other

## 2023-02-05 DIAGNOSIS — R339 Retention of urine, unspecified: Secondary | ICD-10-CM | POA: Diagnosis not present

## 2023-02-05 NOTE — Progress Notes (Signed)
Fill and Pull Catheter Removal  Patient is present today for a catheter removal.  Patient was cleaned and prepped in a sterile fashion 60 ml of sterile water/ saline was instilled into the bladder when the patient felt the urge to urinate. 10ml of water was then drained from the balloon.  A 16 FR coude foley cath was removed from the bladder no complications were noted .  Patient as then given some time to void on their own.  Patient can void  60ml on their own after some time.  Patient tolerated well.  Performed by: Libby Goehring LPN  Follow up/ Additional notes: 1 month with pvr Daughter will call back to confirm transportation can bring patient in the morning for PVR

## 2023-02-06 ENCOUNTER — Telehealth: Payer: Self-pay

## 2023-02-06 ENCOUNTER — Telehealth: Payer: Self-pay | Admitting: Urology

## 2023-02-06 NOTE — Telephone Encounter (Signed)
Daughter called she can't bring him to do PVR, they have to have 48 hr notice for transportation.   Please call her next week to set up something , he is doing ok right now

## 2023-02-06 NOTE — Telephone Encounter (Signed)
 Opened in error

## 2023-02-06 NOTE — Telephone Encounter (Signed)
Called Pt daughter to confirm PVR appt reschedule Pt daughter confirmed

## 2023-02-18 ENCOUNTER — Ambulatory Visit (INDEPENDENT_AMBULATORY_CARE_PROVIDER_SITE_OTHER): Payer: Medicare Other

## 2023-02-18 DIAGNOSIS — R339 Retention of urine, unspecified: Secondary | ICD-10-CM

## 2023-02-18 NOTE — Progress Notes (Signed)
Bladder Scan completed today.  Patient can void prior to the bladder scan. Bladder scan result: 46  Performed By: Alfonse Spruce. CMA  Additional notes: f/u as scheduled

## 2023-02-25 ENCOUNTER — Ambulatory Visit: Payer: Medicare Other | Admitting: Family Medicine

## 2023-03-06 ENCOUNTER — Ambulatory Visit: Payer: Medicare Other | Admitting: Urology

## 2023-03-17 ENCOUNTER — Encounter: Payer: Self-pay | Admitting: Family Medicine

## 2023-03-17 ENCOUNTER — Ambulatory Visit (INDEPENDENT_AMBULATORY_CARE_PROVIDER_SITE_OTHER): Payer: Medicare Other | Admitting: Family Medicine

## 2023-03-17 VITALS — BP 147/65 | HR 56 | Temp 97.7°F | Ht 66.0 in

## 2023-03-17 DIAGNOSIS — N1832 Chronic kidney disease, stage 3b: Secondary | ICD-10-CM | POA: Diagnosis not present

## 2023-03-17 DIAGNOSIS — R600 Localized edema: Secondary | ICD-10-CM

## 2023-03-17 DIAGNOSIS — I10 Essential (primary) hypertension: Secondary | ICD-10-CM | POA: Diagnosis not present

## 2023-03-17 DIAGNOSIS — D61818 Other pancytopenia: Secondary | ICD-10-CM | POA: Diagnosis not present

## 2023-03-17 DIAGNOSIS — S3994XA Unspecified injury of external genitals, initial encounter: Secondary | ICD-10-CM

## 2023-03-17 MED ORDER — MUPIROCIN 2 % EX OINT: 1.0000 | TOPICAL_OINTMENT | Freq: Two times a day (BID) | CUTANEOUS | 0 refills | Status: AC

## 2023-03-17 MED ORDER — FOLIC ACID 1 MG PO TABS: 1.0000 mg | ORAL_TABLET | Freq: Every day | ORAL | 3 refills | Status: DC

## 2023-03-17 NOTE — Patient Instructions (Signed)
 Labs today.  Topical as prescribed.  Follow up in 1 month.

## 2023-03-18 DIAGNOSIS — S3994XA Unspecified injury of external genitals, initial encounter: Secondary | ICD-10-CM | POA: Insufficient documentation

## 2023-03-18 LAB — CMP14+EGFR
ALT: 24 IU/L (ref 0–44)
AST: 27 IU/L (ref 0–40)
Albumin: 3.9 g/dL (ref 3.6–4.6)
Alkaline Phosphatase: 107 IU/L (ref 44–121)
BUN/Creatinine Ratio: 14 (ref 10–24)
BUN: 21 mg/dL (ref 10–36)
Bilirubin Total: 1.2 mg/dL (ref 0.0–1.2)
CO2: 24 mmol/L (ref 20–29)
Calcium: 9.4 mg/dL (ref 8.6–10.2)
Chloride: 107 mmol/L — ABNORMAL HIGH (ref 96–106)
Creatinine, Ser: 1.51 mg/dL — ABNORMAL HIGH (ref 0.76–1.27)
Globulin, Total: 3.3 g/dL (ref 1.5–4.5)
Glucose: 95 mg/dL (ref 70–99)
Potassium: 4.1 mmol/L (ref 3.5–5.2)
Sodium: 142 mmol/L (ref 134–144)
Total Protein: 7.2 g/dL (ref 6.0–8.5)
eGFR: 43 mL/min/{1.73_m2} — ABNORMAL LOW (ref >59)

## 2023-03-18 LAB — CBC
Hematocrit: 28.8 % — ABNORMAL LOW (ref 37.5–51.0)
Hemoglobin: 9.7 g/dL — ABNORMAL LOW (ref 13.0–17.7)
MCH: 34.3 pg — ABNORMAL HIGH (ref 26.6–33.0)
MCHC: 33.7 g/dL (ref 31.5–35.7)
MCV: 102 fL — ABNORMAL HIGH (ref 79–97)
NRBC: 3 % — ABNORMAL HIGH (ref 0–0)
Platelets: 166 10*3/uL (ref 150–450)
RBC: 2.83 x10E6/uL — ABNORMAL LOW (ref 4.14–5.80)
RDW: 22 % — ABNORMAL HIGH (ref 11.6–15.4)
WBC: 5.6 10*3/uL (ref 3.4–10.8)

## 2023-03-18 NOTE — Assessment & Plan Note (Signed)
 Topical Bactroban as prescribed.

## 2023-03-18 NOTE — Assessment & Plan Note (Signed)
Metabolic panel today to assess. 

## 2023-03-18 NOTE — Assessment & Plan Note (Signed)
 Given his age, blood pressure is fairly well-controlled.  Continue current medications.

## 2023-03-18 NOTE — Assessment & Plan Note (Signed)
 He is not currently taking Lasix.  Metabolic panel today to assess renal function.  Once that returns, we can consider restarting diuretic therapy.
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
## 2023-03-24 ENCOUNTER — Other Ambulatory Visit: Payer: Self-pay

## 2023-03-29 ENCOUNTER — Other Ambulatory Visit: Payer: Self-pay | Admitting: Family Medicine

## 2023-03-29 DIAGNOSIS — R600 Localized edema: Secondary | ICD-10-CM

## 2023-04-03 ENCOUNTER — Ambulatory Visit: Payer: Medicare Other | Admitting: Urology
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
## 2023-04-08 ENCOUNTER — Ambulatory Visit: Admitting: Podiatry

## 2023-04-09 ENCOUNTER — Ambulatory Visit: Admitting: Urology

## 2023-04-09 DIAGNOSIS — N138 Other obstructive and reflux uropathy: Secondary | ICD-10-CM

## 2023-04-20 ENCOUNTER — Emergency Department (HOSPITAL_COMMUNITY)
Admission: EM | Admit: 2023-04-20 | Discharge: 2023-04-20 | Discharge status: Against Medical Advice | Source: Home / Self Care

## 2023-04-20 ENCOUNTER — Other Ambulatory Visit: Payer: Self-pay

## 2023-04-20 DIAGNOSIS — I130 Hypertensive heart and chronic kidney disease with heart failure and stage 1 through stage 4 chronic kidney disease, or unspecified chronic kidney disease: Secondary | ICD-10-CM | POA: Diagnosis not present

## 2023-04-20 DIAGNOSIS — E11649 Type 2 diabetes mellitus with hypoglycemia without coma: Secondary | ICD-10-CM | POA: Diagnosis not present

## 2023-04-20 DIAGNOSIS — Z5321 Procedure and treatment not carried out due to patient leaving prior to being seen by health care provider: Secondary | ICD-10-CM | POA: Insufficient documentation

## 2023-04-20 DIAGNOSIS — R300 Dysuria: Secondary | ICD-10-CM | POA: Insufficient documentation

## 2023-04-20 DIAGNOSIS — Z9861 Coronary angioplasty status: Secondary | ICD-10-CM | POA: Diagnosis not present

## 2023-04-20 DIAGNOSIS — K5909 Other constipation: Secondary | ICD-10-CM | POA: Diagnosis not present

## 2023-04-20 DIAGNOSIS — I6782 Cerebral ischemia: Secondary | ICD-10-CM | POA: Diagnosis not present

## 2023-04-20 DIAGNOSIS — R0989 Other specified symptoms and signs involving the circulatory and respiratory systems: Secondary | ICD-10-CM | POA: Diagnosis not present

## 2023-04-20 DIAGNOSIS — E1122 Type 2 diabetes mellitus with diabetic chronic kidney disease: Secondary | ICD-10-CM | POA: Diagnosis not present

## 2023-04-20 DIAGNOSIS — N138 Other obstructive and reflux uropathy: Secondary | ICD-10-CM | POA: Diagnosis not present

## 2023-04-20 DIAGNOSIS — I5033 Acute on chronic diastolic (congestive) heart failure: Secondary | ICD-10-CM | POA: Diagnosis not present

## 2023-04-20 DIAGNOSIS — Z8249 Family history of ischemic heart disease and other diseases of the circulatory system: Secondary | ICD-10-CM | POA: Diagnosis not present

## 2023-04-20 DIAGNOSIS — B9689 Other specified bacterial agents as the cause of diseases classified elsewhere: Secondary | ICD-10-CM | POA: Diagnosis not present

## 2023-04-20 DIAGNOSIS — D696 Thrombocytopenia, unspecified: Secondary | ICD-10-CM | POA: Diagnosis not present

## 2023-04-20 DIAGNOSIS — R4182 Altered mental status, unspecified: Secondary | ICD-10-CM | POA: Diagnosis not present

## 2023-04-20 DIAGNOSIS — R680 Hypothermia, not associated with low environmental temperature: Secondary | ICD-10-CM | POA: Diagnosis not present

## 2023-04-20 DIAGNOSIS — Z66 Do not resuscitate: Secondary | ICD-10-CM | POA: Diagnosis not present

## 2023-04-20 DIAGNOSIS — I252 Old myocardial infarction: Secondary | ICD-10-CM | POA: Diagnosis not present

## 2023-04-20 DIAGNOSIS — Z87891 Personal history of nicotine dependence: Secondary | ICD-10-CM | POA: Diagnosis not present

## 2023-04-20 DIAGNOSIS — G9341 Metabolic encephalopathy: Secondary | ICD-10-CM | POA: Diagnosis not present

## 2023-04-20 DIAGNOSIS — I110 Hypertensive heart disease with heart failure: Secondary | ICD-10-CM | POA: Diagnosis not present

## 2023-04-20 DIAGNOSIS — I2510 Atherosclerotic heart disease of native coronary artery without angina pectoris: Secondary | ICD-10-CM | POA: Diagnosis not present

## 2023-04-20 DIAGNOSIS — D539 Nutritional anemia, unspecified: Secondary | ICD-10-CM | POA: Diagnosis not present

## 2023-04-20 DIAGNOSIS — E782 Mixed hyperlipidemia: Secondary | ICD-10-CM | POA: Diagnosis not present

## 2023-04-20 DIAGNOSIS — N1832 Chronic kidney disease, stage 3b: Secondary | ICD-10-CM | POA: Diagnosis not present

## 2023-04-20 DIAGNOSIS — R918 Other nonspecific abnormal finding of lung field: Secondary | ICD-10-CM | POA: Diagnosis not present

## 2023-04-20 DIAGNOSIS — I509 Heart failure, unspecified: Secondary | ICD-10-CM | POA: Diagnosis not present

## 2023-04-20 DIAGNOSIS — N390 Urinary tract infection, site not specified: Secondary | ICD-10-CM | POA: Diagnosis not present

## 2023-04-20 NOTE — ED Triage Notes (Signed)
 Pt brb daughter; c/o painful urination; symptoms started last night, approx. 4 a.m.; daughter states she "woke up to him yelling." Stated she thinks hallucinations are present. A&Ox4.

## 2023-04-21 ENCOUNTER — Encounter (HOSPITAL_COMMUNITY): Payer: Self-pay | Admitting: Emergency Medicine

## 2023-04-21 ENCOUNTER — Inpatient Hospital Stay (HOSPITAL_COMMUNITY)
Admission: EM | Admit: 2023-04-21 | Discharge: 2023-04-27 | DRG: 689 | Disposition: A | Discharge status: Skilled Nursing Facility | Attending: Family Medicine | Admitting: Family Medicine

## 2023-04-21 ENCOUNTER — Emergency Department (HOSPITAL_COMMUNITY)

## 2023-04-21 ENCOUNTER — Other Ambulatory Visit: Payer: Self-pay

## 2023-04-21 ENCOUNTER — Ambulatory Visit: Admitting: Family Medicine

## 2023-04-21 DIAGNOSIS — R0989 Other specified symptoms and signs involving the circulatory and respiratory systems: Secondary | ICD-10-CM | POA: Diagnosis not present

## 2023-04-21 DIAGNOSIS — E11649 Type 2 diabetes mellitus with hypoglycemia without coma: Secondary | ICD-10-CM | POA: Diagnosis present

## 2023-04-21 DIAGNOSIS — Z7189 Other specified counseling: Secondary | ICD-10-CM | POA: Diagnosis not present

## 2023-04-21 DIAGNOSIS — E782 Mixed hyperlipidemia: Secondary | ICD-10-CM | POA: Diagnosis present

## 2023-04-21 DIAGNOSIS — N1832 Chronic kidney disease, stage 3b: Secondary | ICD-10-CM | POA: Diagnosis present

## 2023-04-21 DIAGNOSIS — D539 Nutritional anemia, unspecified: Secondary | ICD-10-CM | POA: Diagnosis present

## 2023-04-21 DIAGNOSIS — I509 Heart failure, unspecified: Secondary | ICD-10-CM

## 2023-04-21 DIAGNOSIS — Z9861 Coronary angioplasty status: Secondary | ICD-10-CM

## 2023-04-21 DIAGNOSIS — Z7401 Bed confinement status: Secondary | ICD-10-CM | POA: Diagnosis not present

## 2023-04-21 DIAGNOSIS — N401 Enlarged prostate with lower urinary tract symptoms: Secondary | ICD-10-CM | POA: Diagnosis present

## 2023-04-21 DIAGNOSIS — R531 Weakness: Secondary | ICD-10-CM | POA: Diagnosis not present

## 2023-04-21 DIAGNOSIS — Z66 Do not resuscitate: Secondary | ICD-10-CM | POA: Diagnosis present

## 2023-04-21 DIAGNOSIS — I5033 Acute on chronic diastolic (congestive) heart failure: Secondary | ICD-10-CM | POA: Diagnosis present

## 2023-04-21 DIAGNOSIS — T68XXXA Hypothermia, initial encounter: Secondary | ICD-10-CM | POA: Diagnosis present

## 2023-04-21 DIAGNOSIS — N390 Urinary tract infection, site not specified: Secondary | ICD-10-CM | POA: Diagnosis not present

## 2023-04-21 DIAGNOSIS — Z8249 Family history of ischemic heart disease and other diseases of the circulatory system: Secondary | ICD-10-CM | POA: Diagnosis not present

## 2023-04-21 DIAGNOSIS — I2510 Atherosclerotic heart disease of native coronary artery without angina pectoris: Secondary | ICD-10-CM | POA: Diagnosis present

## 2023-04-21 DIAGNOSIS — I6782 Cerebral ischemia: Secondary | ICD-10-CM | POA: Diagnosis not present

## 2023-04-21 DIAGNOSIS — E1122 Type 2 diabetes mellitus with diabetic chronic kidney disease: Secondary | ICD-10-CM | POA: Diagnosis present

## 2023-04-21 DIAGNOSIS — B9689 Other specified bacterial agents as the cause of diseases classified elsewhere: Secondary | ICD-10-CM | POA: Diagnosis present

## 2023-04-21 DIAGNOSIS — R4182 Altered mental status, unspecified: Secondary | ICD-10-CM | POA: Diagnosis present

## 2023-04-21 DIAGNOSIS — R410 Disorientation, unspecified: Secondary | ICD-10-CM

## 2023-04-21 DIAGNOSIS — Z87891 Personal history of nicotine dependence: Secondary | ICD-10-CM

## 2023-04-21 DIAGNOSIS — R1312 Dysphagia, oropharyngeal phase: Secondary | ICD-10-CM | POA: Diagnosis not present

## 2023-04-21 DIAGNOSIS — R404 Transient alteration of awareness: Secondary | ICD-10-CM | POA: Diagnosis not present

## 2023-04-21 DIAGNOSIS — K5909 Other constipation: Secondary | ICD-10-CM | POA: Diagnosis present

## 2023-04-21 DIAGNOSIS — G9341 Metabolic encephalopathy: Secondary | ICD-10-CM | POA: Diagnosis not present

## 2023-04-21 DIAGNOSIS — N138 Other obstructive and reflux uropathy: Secondary | ICD-10-CM | POA: Diagnosis present

## 2023-04-21 DIAGNOSIS — I252 Old myocardial infarction: Secondary | ICD-10-CM

## 2023-04-21 DIAGNOSIS — R001 Bradycardia, unspecified: Secondary | ICD-10-CM | POA: Diagnosis present

## 2023-04-21 DIAGNOSIS — I10 Essential (primary) hypertension: Secondary | ICD-10-CM | POA: Diagnosis present

## 2023-04-21 DIAGNOSIS — F0392 Unspecified dementia, unspecified severity, with psychotic disturbance: Secondary | ICD-10-CM | POA: Diagnosis present

## 2023-04-21 DIAGNOSIS — Z833 Family history of diabetes mellitus: Secondary | ICD-10-CM

## 2023-04-21 DIAGNOSIS — M6281 Muscle weakness (generalized): Secondary | ICD-10-CM | POA: Diagnosis not present

## 2023-04-21 DIAGNOSIS — I110 Hypertensive heart disease with heart failure: Secondary | ICD-10-CM | POA: Diagnosis not present

## 2023-04-21 DIAGNOSIS — N3000 Acute cystitis without hematuria: Secondary | ICD-10-CM | POA: Diagnosis not present

## 2023-04-21 DIAGNOSIS — Z888 Allergy status to other drugs, medicaments and biological substances status: Secondary | ICD-10-CM

## 2023-04-21 DIAGNOSIS — Z743 Need for continuous supervision: Secondary | ICD-10-CM | POA: Diagnosis not present

## 2023-04-21 DIAGNOSIS — E162 Hypoglycemia, unspecified: Secondary | ICD-10-CM | POA: Diagnosis not present

## 2023-04-21 DIAGNOSIS — I130 Hypertensive heart and chronic kidney disease with heart failure and stage 1 through stage 4 chronic kidney disease, or unspecified chronic kidney disease: Secondary | ICD-10-CM | POA: Diagnosis present

## 2023-04-21 DIAGNOSIS — R5381 Other malaise: Secondary | ICD-10-CM | POA: Diagnosis not present

## 2023-04-21 DIAGNOSIS — G9340 Encephalopathy, unspecified: Secondary | ICD-10-CM | POA: Diagnosis not present

## 2023-04-21 DIAGNOSIS — R680 Hypothermia, not associated with low environmental temperature: Secondary | ICD-10-CM | POA: Diagnosis present

## 2023-04-21 DIAGNOSIS — R278 Other lack of coordination: Secondary | ICD-10-CM | POA: Diagnosis not present

## 2023-04-21 DIAGNOSIS — E66813 Obesity, class 3: Secondary | ICD-10-CM | POA: Diagnosis present

## 2023-04-21 DIAGNOSIS — I5031 Acute diastolic (congestive) heart failure: Secondary | ICD-10-CM | POA: Diagnosis not present

## 2023-04-21 DIAGNOSIS — Z79899 Other long term (current) drug therapy: Secondary | ICD-10-CM

## 2023-04-21 DIAGNOSIS — M7989 Other specified soft tissue disorders: Secondary | ICD-10-CM | POA: Diagnosis present

## 2023-04-21 DIAGNOSIS — Z6841 Body Mass Index (BMI) 40.0 and over, adult: Secondary | ICD-10-CM | POA: Diagnosis not present

## 2023-04-21 DIAGNOSIS — R918 Other nonspecific abnormal finding of lung field: Secondary | ICD-10-CM | POA: Diagnosis not present

## 2023-04-21 DIAGNOSIS — R6889 Other general symptoms and signs: Secondary | ICD-10-CM | POA: Diagnosis not present

## 2023-04-21 DIAGNOSIS — D696 Thrombocytopenia, unspecified: Secondary | ICD-10-CM | POA: Diagnosis present

## 2023-04-21 DIAGNOSIS — Z7982 Long term (current) use of aspirin: Secondary | ICD-10-CM

## 2023-04-21 DIAGNOSIS — Z515 Encounter for palliative care: Secondary | ICD-10-CM | POA: Diagnosis not present

## 2023-04-21 LAB — CBC
HCT: 29 % — ABNORMAL LOW (ref 39.0–52.0)
Hemoglobin: 9.2 g/dL — ABNORMAL LOW (ref 13.0–17.0)
MCH: 34.1 pg — ABNORMAL HIGH (ref 26.0–34.0)
MCHC: 31.7 g/dL (ref 30.0–36.0)
MCV: 107.4 fL — ABNORMAL HIGH (ref 80.0–100.0)
Platelets: 105 10*3/uL — ABNORMAL LOW (ref 150–400)
RBC: 2.7 MIL/uL — ABNORMAL LOW (ref 4.22–5.81)
RDW: 27.3 % — ABNORMAL HIGH (ref 11.5–15.5)
WBC: 5 10*3/uL (ref 4.0–10.5)
nRBC: 4.2 % — ABNORMAL HIGH (ref 0.0–0.2)

## 2023-04-21 LAB — CBG MONITORING, ED
Glucose-Capillary: 69 mg/dL — ABNORMAL LOW (ref 70–99)
Glucose-Capillary: 66 mg/dL — ABNORMAL LOW (ref 70–99)
Glucose-Capillary: 122 mg/dL — ABNORMAL HIGH (ref 70–99)

## 2023-04-21 LAB — TROPONIN I (HIGH SENSITIVITY)
Troponin I (High Sensitivity): 19 ng/L — ABNORMAL HIGH (ref <18)
Troponin I (High Sensitivity): 17 ng/L (ref <18)

## 2023-04-21 LAB — URINALYSIS, W/ REFLEX TO CULTURE (INFECTION SUSPECTED)
Bilirubin Urine: NEGATIVE
Glucose, UA: NEGATIVE mg/dL
Hgb urine dipstick: NEGATIVE
Ketones, ur: NEGATIVE mg/dL
Nitrite: POSITIVE — AB
Protein, ur: 30 mg/dL — AB
Specific Gravity, Urine: 1.013 (ref 1.005–1.030)
pH: 5 (ref 5.0–8.0)

## 2023-04-21 LAB — COMPREHENSIVE METABOLIC PANEL WITH GFR
ALT: 31 U/L (ref 0–44)
AST: 34 U/L (ref 15–41)
Albumin: 3.9 g/dL (ref 3.5–5.0)
Alkaline Phosphatase: 102 U/L (ref 38–126)
Anion gap: 11 (ref 5–15)
BUN: 30 mg/dL — ABNORMAL HIGH (ref 8–23)
CO2: 20 mmol/L — ABNORMAL LOW (ref 22–32)
Calcium: 9.5 mg/dL (ref 8.9–10.3)
Chloride: 112 mmol/L — ABNORMAL HIGH (ref 98–111)
Creatinine, Ser: 1.54 mg/dL — ABNORMAL HIGH (ref 0.61–1.24)
GFR, Estimated: 42 mL/min — ABNORMAL LOW (ref >60)
Glucose, Bld: 82 mg/dL (ref 70–99)
Potassium: 4 mmol/L (ref 3.5–5.1)
Sodium: 143 mmol/L (ref 135–145)
Total Bilirubin: 2.1 mg/dL — ABNORMAL HIGH (ref 0.0–1.2)
Total Protein: 7.9 g/dL (ref 6.5–8.1)

## 2023-04-21 LAB — BRAIN NATRIURETIC PEPTIDE: B Natriuretic Peptide: 1362 pg/mL — ABNORMAL HIGH (ref 0.0–100.0)

## 2023-04-21 LAB — GLUCOSE, CAPILLARY: Glucose-Capillary: 75 mg/dL (ref 70–99)

## 2023-04-21 LAB — AMMONIA: Ammonia: 31 umol/L (ref 9–35)

## 2023-04-21 LAB — LACTIC ACID, PLASMA
Lactic Acid, Venous: 1.7 mmol/L (ref 0.5–1.9)
Lactic Acid, Venous: 1.8 mmol/L (ref 0.5–1.9)

## 2023-04-21 LAB — ETHANOL: Alcohol, Ethyl (B): <10 mg/dL (ref <10)

## 2023-04-21 MED ORDER — ENOXAPARIN SODIUM 40 MG/0.4ML IJ SOSY
40.0000 mg | PREFILLED_SYRINGE | INTRAMUSCULAR | Status: DC
  Administered 2023-04-22 – 2023-04-23 (×2): 40 mg via SUBCUTANEOUS
  Filled 2023-04-21 (×2): qty 0.4

## 2023-04-21 MED ORDER — SODIUM CHLORIDE 0.9 % IV SOLN
2.0000 g | Freq: Once | INTRAVENOUS | Status: AC
  Administered 2023-04-21: 2 g via INTRAVENOUS
  Filled 2023-04-21: qty 20

## 2023-04-21 MED ORDER — FUROSEMIDE 10 MG/ML IJ SOLN
40.0000 mg | Freq: Once | INTRAMUSCULAR | Status: AC
  Administered 2023-04-21: 40 mg via INTRAVENOUS
  Filled 2023-04-21: qty 4

## 2023-04-21 MED ORDER — FUROSEMIDE 10 MG/ML IJ SOLN
40.0000 mg | Freq: Two times a day (BID) | INTRAMUSCULAR | Status: DC
  Administered 2023-04-22: 40 mg via INTRAVENOUS
  Filled 2023-04-21: qty 4

## 2023-04-21 MED ORDER — ACETAMINOPHEN 325 MG PO TABS
650.0000 mg | ORAL_TABLET | Freq: Four times a day (QID) | ORAL | Status: DC | PRN
  Filled 2023-04-21: qty 2

## 2023-04-21 MED ORDER — DEXTROSE 50 % IV SOLN
INTRAVENOUS | Status: AC
  Filled 2023-04-21: qty 50

## 2023-04-21 MED ORDER — ACETAMINOPHEN 650 MG RE SUPP: 650.0000 mg | Freq: Four times a day (QID) | RECTAL | Status: DC | PRN

## 2023-04-21 MED ORDER — ONDANSETRON HCL 4 MG PO TABS: 4.0000 mg | ORAL_TABLET | Freq: Four times a day (QID) | ORAL | Status: DC | PRN

## 2023-04-21 MED ORDER — ONDANSETRON HCL 4 MG/2ML IJ SOLN
4.0000 mg | Freq: Four times a day (QID) | INTRAMUSCULAR | Status: DC | PRN
Start: 2023-04-21 — End: 2023-04-27

## 2023-04-21 NOTE — ED Notes (Signed)
 Pt given carbonated drink for low blood sugar, will continue to monitor.

## 2023-04-21 NOTE — ED Triage Notes (Signed)
 Pt BIB EMS for AMS, possible UTI?, normally AxOx3, but family notice his cognition has changed, PMH includes CHF, which he takes diuretic for, seen for same 04/20/23

## 2023-04-21 NOTE — ED Notes (Signed)
 Applied Lawyer to patient.
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
## 2023-04-22 ENCOUNTER — Inpatient Hospital Stay (HOSPITAL_COMMUNITY)

## 2023-04-22 ENCOUNTER — Other Ambulatory Visit (HOSPITAL_COMMUNITY): Payer: Self-pay | Admitting: *Deleted

## 2023-04-22 ENCOUNTER — Encounter (HOSPITAL_COMMUNITY): Payer: Self-pay | Admitting: Internal Medicine

## 2023-04-22 DIAGNOSIS — N390 Urinary tract infection, site not specified: Secondary | ICD-10-CM | POA: Diagnosis not present

## 2023-04-22 DIAGNOSIS — Z515 Encounter for palliative care: Secondary | ICD-10-CM

## 2023-04-22 DIAGNOSIS — N138 Other obstructive and reflux uropathy: Secondary | ICD-10-CM

## 2023-04-22 DIAGNOSIS — Z7189 Other specified counseling: Secondary | ICD-10-CM | POA: Diagnosis not present

## 2023-04-22 DIAGNOSIS — D696 Thrombocytopenia, unspecified: Secondary | ICD-10-CM | POA: Diagnosis not present

## 2023-04-22 DIAGNOSIS — N3000 Acute cystitis without hematuria: Secondary | ICD-10-CM

## 2023-04-22 DIAGNOSIS — N401 Enlarged prostate with lower urinary tract symptoms: Secondary | ICD-10-CM | POA: Diagnosis not present

## 2023-04-22 DIAGNOSIS — I5031 Acute diastolic (congestive) heart failure: Secondary | ICD-10-CM

## 2023-04-22 DIAGNOSIS — D539 Nutritional anemia, unspecified: Secondary | ICD-10-CM | POA: Diagnosis not present

## 2023-04-22 LAB — CBC
HCT: 27.7 % — ABNORMAL LOW (ref 39.0–52.0)
Hemoglobin: 9 g/dL — ABNORMAL LOW (ref 13.0–17.0)
MCH: 34.6 pg — ABNORMAL HIGH (ref 26.0–34.0)
MCHC: 32.5 g/dL (ref 30.0–36.0)
MCV: 106.5 fL — ABNORMAL HIGH (ref 80.0–100.0)
Platelets: 103 10*3/uL — ABNORMAL LOW (ref 150–400)
RBC: 2.6 MIL/uL — ABNORMAL LOW (ref 4.22–5.81)
RDW: 26.1 % — ABNORMAL HIGH (ref 11.5–15.5)
WBC: 5.3 10*3/uL (ref 4.0–10.5)
nRBC: 3.8 % — ABNORMAL HIGH (ref 0.0–0.2)

## 2023-04-22 LAB — COMPREHENSIVE METABOLIC PANEL WITH GFR
ALT: 30 U/L (ref 0–44)
AST: 33 U/L (ref 15–41)
Albumin: 3.5 g/dL (ref 3.5–5.0)
Alkaline Phosphatase: 98 U/L (ref 38–126)
Anion gap: 7 (ref 5–15)
BUN: 30 mg/dL — ABNORMAL HIGH (ref 8–23)
CO2: 20 mmol/L — ABNORMAL LOW (ref 22–32)
Calcium: 9.6 mg/dL (ref 8.9–10.3)
Chloride: 115 mmol/L — ABNORMAL HIGH (ref 98–111)
Creatinine, Ser: 1.61 mg/dL — ABNORMAL HIGH (ref 0.61–1.24)
GFR, Estimated: 40 mL/min — ABNORMAL LOW (ref >60)
Glucose, Bld: 85 mg/dL (ref 70–99)
Potassium: 3.9 mmol/L (ref 3.5–5.1)
Sodium: 142 mmol/L (ref 135–145)
Total Bilirubin: 2 mg/dL — ABNORMAL HIGH (ref 0.0–1.2)
Total Protein: 7.4 g/dL (ref 6.5–8.1)

## 2023-04-22 LAB — GLUCOSE, CAPILLARY
Glucose-Capillary: 72 mg/dL (ref 70–99)
Glucose-Capillary: 74 mg/dL (ref 70–99)
Glucose-Capillary: 76 mg/dL (ref 70–99)
Glucose-Capillary: 82 mg/dL (ref 70–99)
Glucose-Capillary: 85 mg/dL (ref 70–99)
Glucose-Capillary: 88 mg/dL (ref 70–99)
Glucose-Capillary: 90 mg/dL (ref 70–99)

## 2023-04-22 LAB — ECHOCARDIOGRAM LIMITED
Height: 66 in
S' Lateral: 3 cm
Weight: 4398.62 [oz_av]

## 2023-04-22 LAB — MAGNESIUM: Magnesium: 2.2 mg/dL (ref 1.7–2.4)

## 2023-04-22 LAB — MRSA NEXT GEN BY PCR, NASAL: MRSA by PCR Next Gen: DETECTED — AB

## 2023-04-22 LAB — PHOSPHORUS: Phosphorus: 3.2 mg/dL (ref 2.5–4.6)

## 2023-04-22 MED ORDER — SENNA 8.6 MG PO TABS: 2.0000 | ORAL_TABLET | Freq: Every day | ORAL | Status: DC | PRN

## 2023-04-22 MED ORDER — MUPIROCIN 2 % EX OINT
1.0000 | TOPICAL_OINTMENT | Freq: Two times a day (BID) | CUTANEOUS | Status: AC
  Administered 2023-04-22 – 2023-04-26 (×10): 1 via NASAL
  Filled 2023-04-22 (×2): qty 22

## 2023-04-22 MED ORDER — FUROSEMIDE 10 MG/ML IJ SOLN
40.0000 mg | Freq: Every day | INTRAMUSCULAR | Status: DC
  Administered 2023-04-23: 40 mg via INTRAVENOUS
  Filled 2023-04-22: qty 4

## 2023-04-22 MED ORDER — ASPIRIN 81 MG PO TBEC
81.0000 mg | DELAYED_RELEASE_TABLET | Freq: Every day | ORAL | Status: DC
  Administered 2023-04-22 – 2023-04-27 (×6): 81 mg via ORAL
  Filled 2023-04-22 (×6): qty 1

## 2023-04-22 MED ORDER — AMLODIPINE BESYLATE 5 MG PO TABS
10.0000 mg | ORAL_TABLET | Freq: Every day | ORAL | Status: DC
  Administered 2023-04-22 – 2023-04-24 (×3): 10 mg via ORAL
  Filled 2023-04-22 (×3): qty 2

## 2023-04-22 MED ORDER — AMLODIPINE BESYLATE 5 MG PO TABS: 2.5000 mg | ORAL_TABLET | Freq: Every day | ORAL | Status: DC

## 2023-04-22 MED ORDER — POLYETHYLENE GLYCOL 3350 17 G PO PACK
17.0000 g | PACK | Freq: Two times a day (BID) | ORAL | Status: DC
  Administered 2023-04-22 – 2023-04-27 (×10): 17 g via ORAL
  Filled 2023-04-22 (×11): qty 1

## 2023-04-22 MED ORDER — BISACODYL 10 MG RE SUPP
10.0000 mg | RECTAL | Status: DC
Start: 2023-04-22 — End: 2023-04-27
  Administered 2023-04-22 – 2023-04-27 (×3): 10 mg via RECTAL
  Filled 2023-04-22 (×3): qty 1

## 2023-04-22 MED ORDER — ROSUVASTATIN CALCIUM 5 MG PO TABS
5.0000 mg | ORAL_TABLET | Freq: Every day | ORAL | Status: DC
  Administered 2023-04-22 – 2023-04-27 (×6): 5 mg via ORAL
  Filled 2023-04-22 (×6): qty 1

## 2023-04-22 MED ORDER — TAMSULOSIN HCL 0.4 MG PO CAPS
0.4000 mg | ORAL_CAPSULE | Freq: Every day | ORAL | Status: DC
  Administered 2023-04-22 – 2023-04-27 (×6): 0.4 mg via ORAL
  Filled 2023-04-22 (×6): qty 1

## 2023-04-22 MED ORDER — FOLIC ACID 1 MG PO TABS
1.0000 mg | ORAL_TABLET | Freq: Every day | ORAL | Status: DC
  Administered 2023-04-22 – 2023-04-27 (×6): 1 mg via ORAL
  Filled 2023-04-22 (×6): qty 1

## 2023-04-22 MED ORDER — CHLORHEXIDINE GLUCONATE CLOTH 2 % EX PADS
6.0000 | MEDICATED_PAD | Freq: Every day | CUTANEOUS | Status: AC
  Administered 2023-04-22 – 2023-04-26 (×5): 6 via TOPICAL

## 2023-04-22 MED ORDER — HYDRALAZINE HCL 20 MG/ML IJ SOLN
10.0000 mg | Freq: Four times a day (QID) | INTRAMUSCULAR | Status: DC | PRN
  Administered 2023-04-22: 10 mg via INTRAVENOUS
  Filled 2023-04-22: qty 1

## 2023-04-22 MED ORDER — SODIUM CHLORIDE 0.9 % IV SOLN
2.0000 g | INTRAVENOUS | Status: DC
  Administered 2023-04-22 – 2023-04-23 (×2): 2 g via INTRAVENOUS
  Filled 2023-04-22 (×2): qty 20

## 2023-04-22 MED ORDER — VITAMIN B-12 1000 MCG PO TABS
1000.0000 ug | ORAL_TABLET | Freq: Every day | ORAL | Status: DC
  Administered 2023-04-22 – 2023-04-27 (×6): 1000 ug via ORAL
  Filled 2023-04-22 (×6): qty 1

## 2023-04-22 NOTE — Plan of Care (Signed)
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
## 2023-04-22 NOTE — Progress Notes (Signed)
*  PRELIMINARY RESULTS* Echocardiogram Limited 2-D Echocardiogram  has been performed.  Terrance Miller 04/22/2023, 10:42 AM
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
## 2023-04-22 NOTE — TOC Progression Note (Signed)
 Transition of Care [facility]) - Progression Note    Patient Details  Name: Quaran Kedzierski MRN: [id number] Date of Birth: [birth year]  Transition of Care [facility]) CM/SW Contact  Ander [place], [place] Phone Number: 04/22/2023, 3:11 PM  Clinical Narrative:  PT evaluation pending. Per palliative, refer to [facility] for outpatient palliative. Referral made. Samantha at [facility] notified.      Expected Discharge Plan: Home/Self Care Barriers to Discharge: Continued Medical Work up  Expected Discharge Plan and Services     Post Acute Care Choice: Durable Medical Equipment Living arrangements for the past 2 months: Single Family Home                 DME Arranged: Over bed frame DME Agency: [employer], [facility]       HH Arranged: NA           Social Determinants of Health (SDOH) Interventions SDOH Screenings   Food Insecurity: Patient Unable To Answer (04/21/2023)  Housing: Patient Unable To Answer (04/21/2023)  Transportation Needs: Patient Unable To Answer (04/21/2023)  Utilities: Patient Unable To Answer (04/21/2023)  Alcohol Screen: Low Risk  (01/05/2023)  Depression (PHQ2-9): Medium Risk (03/17/2023)  Financial Resource Strain: Low Risk  (01/05/2023)  Physical Activity: Inactive (01/05/2023)  Social Connections: Patient Unable To Answer (04/21/2023)  Stress: No Stress Concern Present (01/05/2023)  Tobacco Use: Medium Risk (04/22/2023)  Health Literacy: Adequate Health Literacy (01/05/2023)    Readmission Risk Interventions    04/22/2023   10:47 AM 01/05/2023   10:39 AM 12/20/2022   11:51 AM  Readmission Risk Prevention Plan  Transportation Screening Complete Complete Complete  HRI or Home Care Consult Complete  Complete  Social Work Consult for Recovery Care Planning/Counseling Complete  Complete  Palliative Care Screening Complete  Not Applicable  Medication Review Oceanographer) Complete Complete Complete  HRI or Home Care Consult  Complete   SW  Recovery Care/Counseling Consult  Complete   Palliative Care Screening  Complete   Skilled Nursing Facility  Complete

## 2023-04-22 NOTE — TOC Initial Note (Signed)
 Transition of Care [facility]) - Initial/Assessment Note    Patient Details  Name: Terrance Miller MRN: [id number] Date of Birth: 1928/08/30  Transition of Care [facility]) CM/SW Contact:    Cyndie Dredge, LCSWA Phone Number: 04/22/2023, 10:50 AM  Clinical Narrative:                  Patient is a risk for readmission due to high admission score. Patient was admitted for Urinary tract infection. CSW spoke with patient daughter [name] who patient lives with. Daughter states that patient is mostly independent and they assist as needed. Patient has a walker, rollator, and wheelchair at home. Daughter shared that patient needs a new wheelchair because he has gain weight and the one he has is too small. Daughter shared that the original wheelchair came from [facility], but she would like to go through another company since they are hard to get in contact with. CSW did reach out [employer] for further guidance to see if they could provide new chair. Gladys Lamp states that he will look into it. Daughter confirms that patient meals and transportation needs are taken care of by family. Patient use to have home health through [facility], but currently no in home services are active. TOC to follow.    Expected Discharge Plan: Home/Self Care Barriers to Discharge: Continued Medical Work up   Patient Goals and CMS Choice Patient states their goals for this hospitalization and ongoing recovery are:: return back home CMS Medicare.gov Compare Post Acute Care list provided to:: Patient Represenative (must comment) (Daughter- [name]) Choice offered to / list presented to : Adult Children      Expected Discharge Plan and Services     Post Acute Care Choice: Durable Medical Equipment Living arrangements for the past 2 months: Single Family Home                 DME Arranged: Over bed frame DME Agency: [facility], [facility]       HH Arranged: NA          Prior Living Arrangements/Services Living  arrangements for the past 2 months: Single Family Home Lives with:: Adult Children, Relatives Patient language and need for interpreter reviewed:: Yes Do you feel safe going back to the place where you live?: Yes      Need for Family Participation in Patient Care: Yes (Comment) Care giver support system in place?: Yes (comment) Current home services: DME Criminal Activity/Legal Involvement Pertinent to Current Situation/Hospitalization: No - Comment as needed  Activities of Daily Living   ADL Screening (condition at time of admission) Independently performs ADLs?: No Does the patient have a NEW difficulty with bathing/dressing/toileting/self-feeding that is expected to last >3 days?: No Does the patient have a NEW difficulty with getting in/out of bed, walking, or climbing stairs that is expected to last >3 days?: No Does the patient have a NEW difficulty with communication that is expected to last >3 days?: No Is the patient deaf or have difficulty hearing?: No Does the patient have difficulty seeing, even when wearing glasses/contacts?: No Does the patient have difficulty concentrating, remembering, or making decisions?: Yes  Permission Sought/Granted   Permission granted to share information with : Yes, Verbal Permission Granted  Share Information with NAME: [name]     Permission granted to share info w Relationship: Daughter     Emotional Assessment Appearance:: Appears stated age     Orientation: : Oriented to Self Alcohol / Substance Use: Not Applicable Psych Involvement: No (comment)  Admission diagnosis:  UTI (urinary tract infection) [N39.0] Patient Active Problem List   Diagnosis Date Noted   UTI (urinary tract infection) 04/21/2023   Thrombocytopenia (HCC) 04/21/2023   Acute on chronic diastolic CHF (congestive heart failure) (HCC) 04/21/2023   Obesity, Class III, BMI 40-49.9 (morbid obesity) (HCC) 04/21/2023   Injury to scrotum, penis, or foreskin 03/18/2023    Type 2 diabetes mellitus with chronic kidney disease, with long-term current use of insulin (HCC) 12/20/2022   Glaucoma 12/20/2022   Vascular dementia without behavioral disturbance (HCC) 08/15/2022   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   Aortic atherosclerosis (HCC) 08/05/2022   Chronic constipation 08/05/2022   Increased intraocular pressure, bilateral 08/05/2022   Edema, peripheral 08/05/2022   Aortic regurgitation 03/17/2022   Pulmonary HTN (HCC) 03/17/2022   Dry skin dermatitis 02/05/2022   Stage 3b chronic kidney disease (HCC) 11/05/2021   MGUS (monoclonal gammopathy of unknown significance) 11/05/2021   Acute metabolic encephalopathy 07/27/2021   Macrocytic anemia 05/31/2021   History of MI (myocardial infarction) 04/29/2021   Hypothermia 05/20/2017   Essential hypertension 09/28/2013   Hyperlipidemia LDL goal <70 09/28/2013   CAD (coronary artery disease) 09/28/2013   PCP:  Myrna Ast, DO Pharmacy:   [facility] - [place], [place] - [address] [place] [address] Phone: [phone] Fax: [phone]  [facility] - [place], [place] - [address] [place] [address] Phone: [phone] Fax: [phone]     Social Drivers of Health (SDOH) Social History: SDOH Screenings   Food Insecurity: Patient Unable To Answer (04/21/2023)  Housing: Patient Unable To Answer (04/21/2023)  Transportation Needs: Patient Unable To Answer (04/21/2023)  Utilities: Patient Unable To Answer (04/21/2023)  Alcohol Screen: Low Risk  (01/05/2023)  Depression (PHQ2-9): Medium Risk (03/17/2023)  Financial Resource Strain: Low Risk  (01/05/2023)  Physical Activity: Inactive (01/05/2023)  Social Connections: Patient Unable To Answer (04/21/2023)  Stress: No Stress Concern Present (01/05/2023)  Tobacco Use: Medium Risk (04/22/2023)  Health Literacy: Adequate Health Literacy (01/05/2023)    SDOH Interventions:     Readmission Risk Interventions    04/22/2023   10:47 AM 01/05/2023   10:39 AM 12/20/2022   11:51 AM  Readmission Risk Prevention Plan  Transportation Screening Complete Complete Complete  HRI or Home Care Consult Complete  Complete  Social Work Consult for Recovery Care Planning/Counseling Complete  Complete  Palliative Care Screening Complete  Not Applicable  Medication Review Oceanographer) Complete Complete Complete  HRI or Home Care Consult  Complete   SW Recovery Care/Counseling Consult  Complete   Palliative Care Screening  Complete   Skilled Nursing Facility  Complete

## 2023-04-23 DIAGNOSIS — G9341 Metabolic encephalopathy: Secondary | ICD-10-CM | POA: Diagnosis not present

## 2023-04-23 DIAGNOSIS — N3000 Acute cystitis without hematuria: Secondary | ICD-10-CM | POA: Diagnosis not present

## 2023-04-23 DIAGNOSIS — T68XXXA Hypothermia, initial encounter: Secondary | ICD-10-CM | POA: Diagnosis not present

## 2023-04-23 DIAGNOSIS — N401 Enlarged prostate with lower urinary tract symptoms: Secondary | ICD-10-CM | POA: Diagnosis not present

## 2023-04-23 LAB — GLUCOSE, CAPILLARY
Glucose-Capillary: 73 mg/dL (ref 70–99)
Glucose-Capillary: 77 mg/dL (ref 70–99)
Glucose-Capillary: 78 mg/dL (ref 70–99)
Glucose-Capillary: 80 mg/dL (ref 70–99)
Glucose-Capillary: 81 mg/dL (ref 70–99)
Glucose-Capillary: 87 mg/dL (ref 70–99)

## 2023-04-23 LAB — BASIC METABOLIC PANEL WITH GFR
Anion gap: 12 (ref 5–15)
BUN: 36 mg/dL — ABNORMAL HIGH (ref 8–23)
CO2: 22 mmol/L (ref 22–32)
Calcium: 9.3 mg/dL (ref 8.9–10.3)
Chloride: 108 mmol/L (ref 98–111)
Creatinine, Ser: 1.77 mg/dL — ABNORMAL HIGH (ref 0.61–1.24)
GFR, Estimated: 36 mL/min — ABNORMAL LOW (ref >60)
Glucose, Bld: 77 mg/dL (ref 70–99)
Potassium: 4 mmol/L (ref 3.5–5.1)
Sodium: 142 mmol/L (ref 135–145)

## 2023-04-23 LAB — CBC
HCT: 25.7 % — ABNORMAL LOW (ref 39.0–52.0)
Hemoglobin: 8.3 g/dL — ABNORMAL LOW (ref 13.0–17.0)
MCH: 33.9 pg (ref 26.0–34.0)
MCHC: 32.3 g/dL (ref 30.0–36.0)
MCV: 104.9 fL — ABNORMAL HIGH (ref 80.0–100.0)
Platelets: 96 10*3/uL — ABNORMAL LOW (ref 150–400)
RBC: 2.45 MIL/uL — ABNORMAL LOW (ref 4.22–5.81)
RDW: 26.6 % — ABNORMAL HIGH (ref 11.5–15.5)
WBC: 4.7 10*3/uL (ref 4.0–10.5)
nRBC: 2.4 % — ABNORMAL HIGH (ref 0.0–0.2)

## 2023-04-23 MED ORDER — ENOXAPARIN SODIUM 30 MG/0.3ML IJ SOSY
30.0000 mg | PREFILLED_SYRINGE | INTRAMUSCULAR | Status: DC
  Administered 2023-04-24 – 2023-04-27 (×4): 30 mg via SUBCUTANEOUS
  Filled 2023-04-23 (×4): qty 0.3

## 2023-04-23 NOTE — Progress Notes (Signed)
 PROGRESS NOTE  Terrance Miller, is a 89 y.o. male, DOB - [birth year], ZOX:[id number]  Admit date - 04/21/2023   Admitting Physician Twilla Galea, DO  Outpatient Primary MD for the patient is Myrna Ast, DO  LOS - 2  Chief Complaint  Patient presents with   Altered Mental Status      Brief Narrative:   89 y.o. male with medical history significant of hypertension, hyperlipidemia, dementia, acute on chronic diastolic CHF, CAD s/p PCI admitted on 04/21/2023 with acute metabolic encephalopathy in setting of UTI and hypothermia    -Assessment and Plan: 1)GNR UTI with Hypothermia--continue Rocephin and warming blanket pending culture data - Urine culture with gram-negative rod,  final ID consult DVT pending -Blood cultures NGTD - Remains on warming blanket  2)Acute metabolic encephalopathy--presumably due to #1 above -- Mentation improving  3)HFpEF--acute on chronic diastolic dysfunction CHF - Admission BNP 1362 - Echo on 04/22/2023 with EF of 50 to 55%, no regional wall motion abnormalities, mild LVH and moderate pulmonary hypertension noted - Continue IV Lasix 40 mg daily   - Monitor renal function  4)CKD 3B Creatinine on admission 1.54 (this is within baseline range) Renally adjust medications, avoid nephrotoxic agents/dehydration/hypotension  5)HTN--- continue amlodipine  6) thrombocytopenia--- monitor closely, no bleeding noted -Suspect related to infection as above #1 - No bleeding concerns  7)Macrocytic anemia Continue vitamin B12 and folic acid per home regimen   Constipation Continue MiraLAX, senna, Dulcolax per home regimen   BPH Continue Flomax   Obesity class III (BMI 44.37) Patient will be counseled on diet and lifestyle medication when more alert   Generalized weakness and deconditioning---Phy Therapy evaluation appreciated recommends SNF rehab  Status is: Inpatient   Disposition: The patient is from: Home              Anticipated d/c is to: SNF                Anticipated d/c date is: 1 day              Patient currently is not medically stable to d/c. Barriers: Not Clinically Stable-   Code Status :  -  Code Status: Limited: Do not attempt resuscitation (DNR) -DNR-LIMITED -Do Not Intubate/DNI    Family Communication:    None at bedside DVT Prophylaxis  :   - SCDs  enoxaparin (LOVENOX) injection 30 mg Start: 04/24/23 1000 SCDs Start: 04/21/23 2234   Lab Results  Component Value Date   PLT 96 (L) 04/23/2023    Inpatient Medications  Scheduled Meds:  amLODipine  10 mg Oral Daily   aspirin EC  81 mg Oral Q breakfast   bisacodyl  10 mg Rectal Q M,W,F   Chlorhexidine Gluconate Cloth  6 each Topical Daily   cyanocobalamin  1,000 mcg Oral Daily   [START ON 04/24/2023] enoxaparin (LOVENOX) injection  30 mg Subcutaneous Q24H   folic acid  1 mg Oral Daily   furosemide  40 mg Intravenous Daily   mupirocin ointment  1 Application Nasal BID   polyethylene glycol  17 g Oral BID   rosuvastatin  5 mg Oral Daily   tamsulosin  0.4 mg Oral Daily   Continuous Infusions:  cefTRIAXone (ROCEPHIN)  IV Stopped (04/22/23 2318)   PRN Meds:.acetaminophen **OR** acetaminophen, hydrALAZINE, ondansetron **OR** ondansetron (ZOFRAN) IV, senna   Anti-infectives (From admission, onward)    Start     Dose/Rate Route Frequency Ordered Stop   04/22/23 2200  cefTRIAXone (ROCEPHIN) 2 g in  sodium chloride 0.9 % 100 mL IVPB        2 g 200 mL/hr over 30 Minutes Intravenous Every 24 hours 04/22/23 0850     04/21/23 2000  cefTRIAXone (ROCEPHIN) 2 g in sodium chloride 0.9 % 100 mL IVPB        2 g 200 mL/hr over 30 Minutes Intravenous  Once 04/21/23 1949 04/21/23 2136         Subjective: [name] today has no fevers, no emesis,  No chest pain,    -More coherent - Temperature is less variable =-Remains on warming blanket   Objective: Vitals:   04/23/23 1315 04/23/23 1330 04/23/23 1400 04/23/23 1600  BP: (!) 129/56 (!) 124/54 (!) 120/48    Pulse: (!) 56 (!) 53 (!) 48   Resp: 16 14 12    Temp:    (!) 96.1 F (35.6 C)  TempSrc:    Rectal  SpO2: 100% 100% 100%   Weight:      Height:        Intake/Output Summary (Last 24 hours) at 04/23/2023 1727 Last data filed at 04/23/2023 1205 Gross per 24 hour  Intake 520 ml  Output 2025 ml  Net -1505 ml   Filed Weights   04/21/23 1411 04/23/23 0251  Weight: 124.7 kg 96.5 kg    Physical Exam  Gen:-Awake, alert, in no acute distress HEENT:- [place].AT, No sclera icterus Neck-Supple Neck,No JVD,.  Lungs-  CTAB , fair symmetrical air movement CV- S1, S2 normal, regular  Abd-  +ve B.Sounds, Abd Soft, No tenderness, no CVA tenderness Extremity/Skin:- No  edema, pedal pulses present  Psych-affect is flat, episodes of confusion and disorientation persist neuro-generalized weakness,  no new focal deficits, no tremors  Data Reviewed: I have personally reviewed following labs and imaging studies  CBC: Recent Labs  Lab 04/21/23 1431 04/22/23 0245 04/23/23 0540  WBC 5.0 5.3 4.7  HGB 9.2* 9.0* 8.3*  HCT 29.0* 27.7* 25.7*  MCV 107.4* 106.5* 104.9*  PLT 105* 103* 96*   Basic Metabolic Panel: Recent Labs  Lab 04/21/23 1431 04/22/23 0245 04/23/23 0540  NA 143 142 142  K 4.0 3.9 4.0  CL 112* 115* 108  CO2 20* 20* 22  GLUCOSE 82 85 77  BUN 30* 30* 36*  CREATININE 1.54* 1.61* 1.77*  CALCIUM 9.5 9.6 9.3  MG  --  2.2  --   PHOS  --  3.2  --    GFR: Estimated Creatinine Clearance: 29 mL/min (A) (by C-G formula based on SCr of 1.77 mg/dL (H)). Liver Function Tests: Recent Labs  Lab 04/21/23 1431 04/22/23 0245  AST 34 33  ALT 31 30  ALKPHOS 102 98  BILITOT 2.1* 2.0*  PROT 7.9 7.4  ALBUMIN 3.9 3.5    Recent Results (from the past 240 hours)  Culture, blood (routine x 2)     Status: None (Preliminary result)   Collection Time: 04/21/23  2:29 PM   Specimen: BLOOD  Result Value Ref Range Status   Specimen Description BLOOD RIGHT ANTECUBITAL  Final   Special Requests    Final    BOTTLES DRAWN AEROBIC AND ANAEROBIC Blood Culture results may not be optimal due to an inadequate volume of blood received in culture bottles   Culture   Final    NO GROWTH 2 DAYS Performed at [facility], [address] [place], [place] [address]    Report Status PENDING  Incomplete  Culture, blood (routine x 2)     Status:  None (Preliminary result)   Collection Time: 04/21/23  4:58 PM   Specimen: BLOOD  Result Value Ref Range Status   Specimen Description BLOOD BLOOD LEFT FOREARM  Final   Special Requests   Final    BOTTLES DRAWN AEROBIC AND ANAEROBIC Blood Culture results may not be optimal due to an inadequate volume of blood received in culture bottles   Culture   Final    NO GROWTH 2 DAYS Performed at [facility], [address] [place], [place] [address]    Report Status PENDING  Incomplete  Urine Culture     Status: Abnormal (Preliminary result)   Collection Time: 04/21/23  6:32 PM   Specimen: Urine, Random  Result Value Ref Range Status   Specimen Description   Final    URINE, RANDOM Performed at [facility], [address] [place], [place] [address]    Special Requests   Final    NONE Reflexed from [id number] Performed at [facility], [address], [place], [place] [address]    Culture (A)  Final    >=100,000 COLONIES/mL GRAM NEGATIVE RODS SUSCEPTIBILITIES TO FOLLOW Performed at [facility], [address] [place], [place] [address]    Report Status PENDING  Incomplete  MRSA Next Gen by PCR, Nasal     Status: Abnormal   Collection Time: 04/21/23 10:50 PM   Specimen: Nasal Mucosa; Nasal Swab  Result Value Ref Range Status   MRSA by PCR Next Gen DETECTED (A) NOT DETECTED Final    Comment: RESULT CALLED TO, READ BACK BY AND VERIFIED WITH: CODY KINDLEY AT 0226 04/22/23 BY A. SNYDER (NOTE) The GeneXpert MRSA Assay (FDA approved for NASAL specimens only), is one component of a comprehensive MRSA colonization surveillance program. It is not  intended to diagnose MRSA infection nor to guide or monitor treatment for MRSA infections. Test performance is not FDA approved in patients less than 58 years old. Performed at [facility], [address], [place], [place] [address]     Radiology Studies: ECHOCARDIOGRAM LIMITED Result Date: 04/22/2023    ECHOCARDIOGRAM LIMITED REPORT   Patient Name:   Terrance Miller Date of Exam: 04/22/2023 Medical Rec #:  [id number]    Height:       66.0 in Accession #:    [id number]   Weight:       274.9 lb Date of Birth:  05-03-1928   BSA:          2.289 m Patient Age:    89 years     BP:           138/70 mmHg Patient Gender: M            HR:           61 bpm. Exam Location:  [facility] Procedure: Limited Echo, 3D Echo, Cardiac Doppler and Limited Color Doppler            (Both Spectral and Color Flow Doppler were utilized during            procedure). Indications:    CHF l50.31  History:        Patient has prior history of Echocardiogram examinations, most                 recent 01/02/2023. CHF, CAD and Previous Myocardial Infarction;                 Risk Factors:Hypertension, Dyslipidemia and Diabetes.  Sonographer:    Denese Finn RCS  Referring Phys: [id number] OLADAPO ADEFESO IMPRESSIONS  1. Left ventricular ejection fraction, by estimation, is 50 to 55%. Left ventricular ejection fraction by 3D volume is 55 %. The left ventricle has low normal function. The left ventricle has no regional wall motion abnormalities. There is mild left ventricular hypertrophy. Left ventricular diastolic function could not be evaluated.  2. Right ventricular systolic function is normal. The right ventricular size is normal. There is moderately elevated pulmonary artery systolic pressure. The estimated right ventricular systolic pressure is 52.2 mmHg.  3. Right atrial size was severely dilated.  4. The inferior vena cava is dilated in size with <50% respiratory variability, suggesting right atrial pressure of 15 mmHg. Comparison(s): No  significant change from prior study. FINDINGS  Left Ventricle: Left ventricular ejection fraction, by estimation, is 50 to 55%. Left ventricular ejection fraction by 3D volume is 55 %. The left ventricle has low normal function. The left ventricle has no regional wall motion abnormalities. The left ventricular internal cavity size was normal in size. There is mild left ventricular hypertrophy. Left ventricular diastolic function could not be evaluated. Right Ventricle: The right ventricular size is normal. No increase in right ventricular wall thickness. Right ventricular systolic function is normal. There is moderately elevated pulmonary artery systolic pressure. The tricuspid regurgitant velocity is 3.05 m/s, and with an assumed right atrial pressure of 15 mmHg, the estimated right ventricular systolic pressure is 52.2 mmHg. Left Atrium: Left atrial size was not assessed. Right Atrium: Right atrial size was severely dilated. Pericardium: There is no evidence of pericardial effusion. Mitral Valve: The mitral valve is normal in structure. Tricuspid Valve: The tricuspid valve is normal in structure. Tricuspid valve regurgitation is trivial. Aortic Valve: The aortic valve is tricuspid. Aortic valve regurgitation is not visualized. Pulmonic Valve: The pulmonic valve was normal in structure. Pulmonic valve regurgitation is not visualized. Aorta: The aortic root is normal in size and structure. Venous: The inferior vena cava is dilated in size with less than 50% respiratory variability, suggesting right atrial pressure of 15 mmHg. IAS/Shunts: No atrial level shunt detected by color flow Doppler. Additional Comments: 3D was performed not requiring image post processing on an independent workstation and was normal.  LEFT VENTRICLE PLAX 2D LVIDd:         4.30 cm LVIDs:         3.00 cm LV PW:         1.10 cm         3D Volume EF LV IVS:        1.30 cm         LV 3D EF:    Left LVOT diam:     2.00 cm                       ventricul LVOT Area:     3.14 cm                     ar                                             ejection  fraction                                             by 3D                                             volume is                                             55 %.                                 3D Volume EF:                                3D EF:        55 %                                LV EDV:       147 ml                                LV ESV:       66 ml                                LV SV:        81 ml RIGHT VENTRICLE TAPSE (M-mode): 1.9 cm LEFT ATRIUM         Index       RIGHT ATRIUM           Index LA diam:    3.90 cm 1.70 cm/m  RA Area:     26.90 cm                                 RA Volume:   103.00 ml 44.99 ml/m   AORTA Ao Root diam: 3.50 cm TRICUSPID VALVE TR Peak grad:   37.2 mmHg TR Vmax:        305.00 cm/s  SHUNTS Systemic Diam: 2.00 cm Vishnu Priya Mallipeddi Electronically signed by Lucetta Russel Mallipeddi Signature Date/Time: 04/22/2023/12:21:08 PM    Final    CT Head Wo Contrast Result Date: 04/21/2023 CLINICAL DATA:  Mental status change, unknown cause. EXAM: CT HEAD WITHOUT CONTRAST TECHNIQUE: Contiguous axial images were obtained from the base of the skull through the vertex without intravenous contrast. RADIATION DOSE REDUCTION: This exam was performed according to the departmental dose-optimization program which includes automated exposure control, adjustment of the mA and/or kV according to patient size and/or use of iterative reconstruction technique. COMPARISON:  Head CT 07/29/2022 FINDINGS: Brain: There is no evidence of an acute infarct, intracranial hemorrhage, mass, midline shift, or extra-axial fluid collection. Cerebral white matter hypodensities are unchanged and nonspecific but compatible with mild chronic small vessel ischemic  disease. There is mild-to-moderate cerebral atrophy. Vascular: Calcified atherosclerosis at the  skull base. No hyperdense vessel. Skull: No acute fracture or suspicious lesion. Sinuses/Orbits: Chronic right-sided sinusitis. Clear mastoid air cells. Bilateral cataract extraction. Other: Punctate bilateral parotid calcifications. IMPRESSION: 1. No evidence of acute intracranial abnormality. 2. Mild chronic small vessel ischemic disease. Electronically Signed   By: Aundra Lee M.D.   On: 04/21/2023 18:53   Scheduled Meds:  amLODipine  10 mg Oral Daily   aspirin EC  81 mg Oral Q breakfast   bisacodyl  10 mg Rectal Q M,W,F   Chlorhexidine Gluconate Cloth  6 each Topical Daily   cyanocobalamin  1,000 mcg Oral Daily   [START ON 04/24/2023] enoxaparin (LOVENOX) injection  30 mg Subcutaneous Q24H   folic acid  1 mg Oral Daily   furosemide  40 mg Intravenous Daily   mupirocin ointment  1 Application Nasal BID   polyethylene glycol  17 g Oral BID   rosuvastatin  5 mg Oral Daily   tamsulosin  0.4 mg Oral Daily   Continuous Infusions:  cefTRIAXone (ROCEPHIN)  IV Stopped (04/22/23 2318)     LOS: 2 days    Colin Dawley M.D on 04/23/2023 at 5:27 PM  Go to [email or web address] - for contact info  [facility] - Office  [phone]  If 7PM-7AM, please contact night-coverage [email or web address] 04/23/2023, 5:27 PM

## 2023-04-23 NOTE — Plan of Care (Signed)
  Problem: Acute Rehab PT Goals(only PT should resolve) Goal: Pt Will Go Supine/Side To Sit Outcome: Progressing Flowsheets (Taken 04/23/2023 1410) Pt will go Supine/Side to Sit:  with supervision  with contact guard assist Goal: Patient Will Transfer Sit To/From Stand Outcome: Progressing Flowsheets (Taken 04/23/2023 1410) Patient will transfer sit to/from stand:  with contact guard assist  with minimal assist Goal: Pt Will Transfer Bed To Chair/Chair To Bed Outcome: Progressing Flowsheets (Taken 04/23/2023 1410) Pt will Transfer Bed to Chair/Chair to Bed:  with contact guard assist  with min assist Goal: Pt Will Ambulate Outcome: Progressing Flowsheets (Taken 04/23/2023 1410) Pt will Ambulate:  50 feet  with contact guard assist  with minimal assist  with rolling walker   2:10 PM, 04/23/23 Walton Guppy, MPT Physical Therapist with [facility] 336 [phone] office [phone] mobile phone

## 2023-04-23 NOTE — [place] FL2 (Signed)
 [place] MEDICAID FL2 LEVEL OF CARE FORM     IDENTIFICATION  Patient Name: Terrance Miller Birthdate: 10/23/1928 Sex: male Admission Date (Current Location): 04/21/2023  [facility] and [place] Number:  [employer] and Address:  [facility],  [address] [place] [address]      Provider Number: [id number]  Attending Physician Name and Address:  Shon Hale, MD  Relative Name and Phone Number:  [name] (Daughter)  [phone]    Current Level of Care: Hospital Recommended Level of Care: Skilled Nursing Facility Prior Approval Number:    Date Approved/Denied:   PASRR Number: [id number]  Discharge Plan: SNF    Current Diagnoses: Patient Active Problem List   Diagnosis Date Noted   UTI (urinary tract infection) 04/21/2023   Thrombocytopenia (HCC) 04/21/2023   Acute on chronic diastolic CHF (congestive heart failure) (HCC) 04/21/2023   Obesity, Class III, BMI 40-49.9 (morbid obesity) (HCC) 04/21/2023   Injury to scrotum, penis, or foreskin 03/18/2023   Type 2 diabetes mellitus with chronic kidney disease, with long-term current use of insulin (HCC) 12/20/2022   Glaucoma 12/20/2022   Vascular dementia without behavioral disturbance (HCC) 08/15/2022   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   Aortic atherosclerosis (HCC) 08/05/2022   Chronic constipation 08/05/2022   Increased intraocular pressure, bilateral 08/05/2022   Edema, peripheral 08/05/2022   Aortic regurgitation 03/17/2022   Pulmonary HTN (HCC) 03/17/2022   Dry skin dermatitis 02/05/2022   Stage 3b chronic kidney disease (HCC) 11/05/2021   MGUS (monoclonal gammopathy of unknown significance) 11/05/2021   Acute metabolic encephalopathy 07/27/2021   Macrocytic anemia 05/31/2021   History of MI (myocardial infarction) 04/29/2021   Hypothermia 05/20/2017   Essential hypertension 09/28/2013   Hyperlipidemia LDL goal <70 09/28/2013   CAD (coronary artery  disease) 09/28/2013    Orientation RESPIRATION BLADDER Height & Weight     Self  Normal External catheter Weight: 96.5 kg (warmer on bed) Height:  5\' 6"  (167.6 cm)  BEHAVIORAL SYMPTOMS/MOOD NEUROLOGICAL BOWEL NUTRITION STATUS      Continent Diet (DC Summary)  AMBULATORY STATUS COMMUNICATION OF NEEDS Skin   Extensive Assist Verbally Normal                       Personal Care Assistance Level of Assistance  Bathing, Feeding, Dressing   Feeding assistance: Limited assistance Dressing Assistance: Maximum assistance     Functional Limitations Info  Sight, Hearing, Speech Sight Info: Impaired Hearing Info: Impaired Speech Info: Adequate    SPECIAL CARE FACTORS FREQUENCY  PT (By licensed PT), OT (By licensed OT)     PT Frequency: 5 times a week OT Frequency: 3 times a week            Contractures Contractures Info: Not present    Additional Factors Info  Code Status, Allergies Code Status Info: DNR- Limited Allergies Info: clobetasol           Current Medications (04/23/2023):  This is the current hospital active medication list Current Facility-Administered Medications  Medication Dose Route Frequency Provider Last Rate Last Admin   acetaminophen (TYLENOL) tablet 650 mg  650 mg Oral Q6H PRN Adefeso, Oladapo, DO       Or   acetaminophen (TYLENOL) suppository 650 mg  650 mg Rectal Q6H PRN Adefeso, Oladapo, DO       amLODipine (NORVASC) tablet 10 mg  10 mg Oral Daily Emokpae, Courage, MD   10 mg at 04/23/23 0912   aspirin  EC tablet 81 mg  81 mg Oral Q breakfast Adefeso, Oladapo, DO   81 mg at 04/23/23 0912   bisacodyl (DULCOLAX) suppository 10 mg  10 mg Rectal Q M,W,F Adefeso, Oladapo, DO   10 mg at 04/22/23 0911   cefTRIAXone (ROCEPHIN) 2 g in sodium chloride 0.9 % 100 mL IVPB  2 g Intravenous Q24H Colin Dawley, MD   Stopped at 04/22/23 2318   Chlorhexidine Gluconate Cloth 2 % PADS 6 each  6 each Topical Daily Adefeso, Oladapo, DO   6 each at 04/23/23 0913    cyanocobalamin (VITAMIN B12) tablet 1,000 mcg  1,000 mcg Oral Daily Adefeso, Oladapo, DO   1,000 mcg at 04/23/23 0913   enoxaparin (LOVENOX) injection 40 mg  40 mg Subcutaneous Q24H Adefeso, Oladapo, DO   40 mg at 04/23/23 0910   folic acid (FOLVITE) tablet 1 mg  1 mg Oral Daily Adefeso, Oladapo, DO   1 mg at 04/23/23 0913   furosemide (LASIX) injection 40 mg  40 mg Intravenous Daily Emokpae, Courage, MD   40 mg at 04/23/23 0912   hydrALAZINE (APRESOLINE) injection 10 mg  10 mg Intravenous Q6H PRN Adefeso, Oladapo, DO   10 mg at 04/22/23 0547   mupirocin ointment (BACTROBAN) 2 % 1 Application  1 Application Nasal BID Adefeso, Oladapo, DO   1 Application at 04/23/23 0912   ondansetron (ZOFRAN) tablet 4 mg  4 mg Oral Q6H PRN Adefeso, Oladapo, DO       Or   ondansetron (ZOFRAN) injection 4 mg  4 mg Intravenous Q6H PRN Adefeso, Oladapo, DO       polyethylene glycol (MIRALAX / GLYCOLAX) packet 17 g  17 g Oral BID Adefeso, Oladapo, DO   17 g at 04/23/23 0912   rosuvastatin (CRESTOR) tablet 5 mg  5 mg Oral Daily Adefeso, Oladapo, DO   5 mg at 04/23/23 0912   senna (SENOKOT) tablet 17.2 mg  2 tablet Oral Daily PRN Adefeso, Oladapo, DO       tamsulosin (FLOMAX) capsule 0.4 mg  0.4 mg Oral Daily Adefeso, Oladapo, DO   0.4 mg at 04/23/23 0913     Discharge Medications: Please see discharge summary for a list of discharge medications.  Relevant Imaging Results:  Relevant Lab Results:   Additional Information SS# [id number]  Orelia Binet, RN

## 2023-04-23 NOTE — Evaluation (Signed)
 Physical Therapy Evaluation Patient Details Name: Terrance Miller MRN: [id number] DOB: Feb 10, 1928 Today's Date: 04/23/2023  History of Present Illness  Terrance Miller is a 89 y.o. male with medical history significant of hypertension, hyperlipidemia, dementia, acute on chronic diastolic CHF, CAD s/p PCI who presents emergency department from home via EMS due to altered mental status.  Patient was unable to provide history due to being somnolent, though arousable, but quickly goes back to sleep and also possibly due to underlying dementia.  History was obtained from ED physician and ED medical record.  Per report, family noticed a change in patient's mental status due to about 1 month of patient reporting of seeing things that were not there.  Today, the confusion was worse and EMS was activated, patient was taken to the ED for further evaluation and management.  Patient was said to complain of chronic leg swelling.   Clinical Impression  Patient required increased time with labored movement for sitting up at bedside, once standing patient incontinent of stool, tolerated standing for up to 8-10 minutes while being cleaned and able to take a few steps forward, backwards before having to sit due to fatigue/fall risk. Patient tolerated sitting up in chair after therapy. Patient will benefit from continued skilled physical therapy in hospital and recommended venue below to increase strength, balance, endurance for safe ADLs and gait.          If plan is discharge home, recommend the following: A little help with bathing/dressing/bathroom;Help with stairs or ramp for entrance;Assistance with cooking/housework;A lot of help with bathing/dressing/bathroom   Can travel by private vehicle   Yes    Equipment Recommendations None recommended by PT  Recommendations for Other Services       Functional Status Assessment Patient has had a recent decline in their functional status and demonstrates the ability to  make significant improvements in function in a reasonable and predictable amount of time.     Precautions / Restrictions Precautions Precautions: Fall Restrictions Weight Bearing Restrictions Per Provider Order: No      Mobility  Bed Mobility Overal bed mobility: Needs Assistance Bed Mobility: Supine to Sit     Supine to sit: Min assist     General bed mobility comments: increased time, labored movement    Transfers Overall transfer level: Needs assistance Equipment used: Rolling walker (2 wheels) Transfers: Sit to/from Stand, Bed to chair/wheelchair/BSC Sit to Stand: Min assist   Step pivot transfers: Min assist       General transfer comment: unsteady labored movement, requires verbal/tactile cueing for safety    Ambulation/Gait Ambulation/Gait assistance: Min assist, Mod assist Gait Distance (Feet): 10 Feet Assistive device: Rolling walker (2 wheels) Gait Pattern/deviations: Decreased step length - right, Decreased step length - left, Decreased stride length Gait velocity: slow     General Gait Details: tolerated a few steps forward backwards with slow labored movement before having to sit due to fatigue  Stairs            Wheelchair Mobility     Tilt Bed    Modified Rankin (Stroke Patients Only)       Balance Overall balance assessment: Needs assistance Sitting-balance support: Feet supported, No upper extremity supported Sitting balance-Leahy Scale: Fair Sitting balance - Comments: fair/good seated at EOB   Standing balance support: Reliant on assistive device for balance, During functional activity, Bilateral upper extremity supported Standing balance-Leahy Scale: Poor Standing balance comment: fair/poor using RW  Pertinent Vitals/Pain Pain Assessment Pain Assessment: No/denies pain    Home Living Family/patient expects to be discharged to:: Private residence Living Arrangements:  Children Available Help at Discharge: Family;Available 24 hours/day Type of Home: House Home Access: Stairs to enter Entrance Stairs-Rails: None Entrance Stairs-Number of Steps: 3 Alternate Level Stairs-Number of Steps: 4 Home Layout: Able to live on main level with bedroom/bathroom;Two level Home Equipment: Toilet riser;Cane - single point;Rolling Walker (2 wheels);Rollator (4 wheels)      Prior Function Prior Level of Function : Needs assist       Physical Assist : Mobility (physical);ADLs (physical) Mobility (physical): Transfers;Gait;Bed mobility;Stairs   Mobility Comments: household ambulator using RW. pt reports assist from daughter and son in law that pt given assist for sit/stand transfers and ambulates independently with RW. ADLs Comments: Assisted with bathing and dressing per pt's reports. Chart review indicates assist for IADL's as well. Pt confirms.     Extremity/Trunk Assessment   Upper Extremity Assessment Upper Extremity Assessment: Generalized weakness    Lower Extremity Assessment Lower Extremity Assessment: Generalized weakness    Cervical / Trunk Assessment Cervical / Trunk Assessment: Normal  Communication   Communication Factors Affecting Communication: Reduced clarity of speech    Cognition Arousal: Alert Behavior During Therapy: WFL for tasks assessed/performed   PT - Cognitive impairments: History of cognitive impairments                         Following commands: Intact       Cueing       General Comments      Exercises     Assessment/Plan    PT Assessment Patient needs continued PT services  PT Problem List Decreased strength;Decreased activity tolerance;Decreased balance;Decreased mobility       PT Treatment Interventions DME instruction;Gait training;Stair training;Functional mobility training;Therapeutic activities;Therapeutic exercise;Balance training;Patient/family education    PT Goals (Current goals can be  found in the Care Plan section)  Acute Rehab PT Goals Patient Stated Goal: return home with family to assist PT Goal Formulation: With patient Time For Goal Achievement: 05/07/23 Potential to Achieve Goals: Good    Frequency Min 3X/week     Co-evaluation               AM-PAC PT "6 Clicks" Mobility  Outcome Measure Help needed turning from your back to your side while in a flat bed without using bedrails?: A Little Help needed moving from lying on your back to sitting on the side of a flat bed without using bedrails?: A Little Help needed moving to and from a bed to a chair (including a wheelchair)?: A Little Help needed standing up from a chair using your arms (e.g., wheelchair or bedside chair)?: A Little Help needed to walk in hospital room?: A Lot Help needed climbing 3-5 steps with a railing? : A Lot 6 Click Score: 16    End of Session   Activity Tolerance: Patient tolerated treatment well;Patient limited by fatigue Patient left: in chair;with call bell/phone within reach;with nursing/sitter in room Nurse Communication: Mobility status PT Visit Diagnosis: Unsteadiness on feet (R26.81);Other abnormalities of gait and mobility (R26.89);Muscle weakness (generalized) (M62.81)    Time: 5284-1324 PT Time Calculation (min) (ACUTE ONLY): 29 min   Charges:   PT Evaluation $PT Eval Moderate Complexity: 1 Mod PT Treatments $Therapeutic Activity: 23-37 mins PT General Charges $$ ACUTE PT VISIT: 1 Visit         2:08 PM, 04/23/23 Fayrene Fearing  Candee Cha, MPT Physical Therapist with [facility] 336 [phone] office [phone] mobile phone

## 2023-04-23 NOTE — TOC Progression Note (Signed)
 Transition of Care [facility]) - Progression Note    Patient Details  Name: Terrance Miller MRN: [id number] Date of Birth: May 11, 1928  [facility]) CM/SW Contact  Orelia Binet, RN Phone Number: 04/23/2023, 12:33 PM  Clinical Narrative:   PT eval with recommendation for SNF, discussed with his daughter. She is agreeable it will be best for him to go a get stronger. First choice is [facility]. FL2 completed a sent out for bed offers to discuss with her and start INS AUTH. TOC following.    Expected Discharge Plan: Skilled Nursing Facility Barriers to Discharge: English as a second language teacher, Continued Medical Work up, SNF Pending bed offer  Expected Discharge Plan and Services     Post Acute Care Choice: Durable Medical Equipment Living arrangements for the past 2 months: Single Family Home                 DME Arranged: Over bed frame DME Agency: [facility], [facility]      HH Arranged: NA       Social Determinants of Health (SDOH) Interventions SDOH Screenings   Food Insecurity: Patient Unable To Answer (04/21/2023)  Housing: Patient Unable To Answer (04/21/2023)  Transportation Needs: Patient Unable To Answer (04/21/2023)  Utilities: Patient Unable To Answer (04/21/2023)  Alcohol Screen: Low Risk  (01/05/2023)  Depression (PHQ2-9): Medium Risk (03/17/2023)  Financial Resource Strain: Low Risk  (01/05/2023)  Physical Activity: Inactive (01/05/2023)  Social Connections: Patient Unable To Answer (04/21/2023)  Stress: No Stress Concern Present (01/05/2023)  Tobacco Use: Medium Risk (04/22/2023)  Health Literacy: Adequate Health Literacy (01/05/2023)    Readmission Risk Interventions    04/23/2023   11:23 AM 04/22/2023   10:47 AM 01/05/2023   10:39 AM  Readmission Risk Prevention Plan  Transportation Screening Complete Complete Complete  HRI or Home Care Consult Complete Complete   Social Work Consult for Recovery Care Planning/Counseling Complete  Complete   Palliative Care Screening Complete Complete   Medication Review Oceanographer) Complete Complete Complete  HRI or Home Care Consult   Complete  SW Recovery Care/Counseling Consult   Complete  Palliative Care Screening   Complete  Skilled Nursing Facility   Complete

## 2023-04-23 NOTE — Plan of Care (Signed)
 Discussed in front of patient plan of care for the evening, pain management and bedtime medications with no evidence of learning at this time.  Patient easily reoriented after waking up confused.  Problem: Education: Goal: Knowledge of General Education information will improve Description: Including pain rating scale, medication(s)/side effects and non-pharmacologic comfort measures Outcome: Not Progressing   Problem: Health Behavior/Discharge Planning: Goal: Ability to manage health-related needs will improve Outcome: Not Progressing

## 2023-04-24 DIAGNOSIS — T68XXXA Hypothermia, initial encounter: Secondary | ICD-10-CM | POA: Diagnosis not present

## 2023-04-24 DIAGNOSIS — N3000 Acute cystitis without hematuria: Secondary | ICD-10-CM | POA: Diagnosis not present

## 2023-04-24 DIAGNOSIS — D696 Thrombocytopenia, unspecified: Secondary | ICD-10-CM | POA: Diagnosis not present

## 2023-04-24 DIAGNOSIS — I5033 Acute on chronic diastolic (congestive) heart failure: Secondary | ICD-10-CM | POA: Diagnosis not present

## 2023-04-24 LAB — CBC
HCT: 25.8 % — ABNORMAL LOW (ref 39.0–52.0)
Hemoglobin: 8.4 g/dL — ABNORMAL LOW (ref 13.0–17.0)
MCH: 34.4 pg — ABNORMAL HIGH (ref 26.0–34.0)
MCHC: 32.6 g/dL (ref 30.0–36.0)
MCV: 105.7 fL — ABNORMAL HIGH (ref 80.0–100.0)
Platelets: 101 10*3/uL — ABNORMAL LOW (ref 150–400)
RBC: 2.44 MIL/uL — ABNORMAL LOW (ref 4.22–5.81)
RDW: 26.1 % — ABNORMAL HIGH (ref 11.5–15.5)
WBC: 4.7 10*3/uL (ref 4.0–10.5)
nRBC: 1.9 % — ABNORMAL HIGH (ref 0.0–0.2)

## 2023-04-24 LAB — GLUCOSE, CAPILLARY
Glucose-Capillary: 70 mg/dL (ref 70–99)
Glucose-Capillary: 72 mg/dL (ref 70–99)
Glucose-Capillary: 104 mg/dL — ABNORMAL HIGH (ref 70–99)
Glucose-Capillary: 107 mg/dL — ABNORMAL HIGH (ref 70–99)
Glucose-Capillary: 101 mg/dL — ABNORMAL HIGH (ref 70–99)

## 2023-04-24 LAB — BASIC METABOLIC PANEL WITH GFR
Anion gap: 8 (ref 5–15)
BUN: 37 mg/dL — ABNORMAL HIGH (ref 8–23)
CO2: 23 mmol/L (ref 22–32)
Calcium: 9.1 mg/dL (ref 8.9–10.3)
Chloride: 107 mmol/L (ref 98–111)
Creatinine, Ser: 1.93 mg/dL — ABNORMAL HIGH (ref 0.61–1.24)
GFR, Estimated: 32 mL/min — ABNORMAL LOW (ref >60)
Glucose, Bld: 72 mg/dL (ref 70–99)
Potassium: 3.8 mmol/L (ref 3.5–5.1)
Sodium: 138 mmol/L (ref 135–145)

## 2023-04-24 LAB — URINE CULTURE: Culture: >=100000 — AB

## 2023-04-24 MED ORDER — ENSURE ENLIVE PO LIQD
237.0000 mL | Freq: Two times a day (BID) | ORAL | Status: DC
Start: 2023-04-24 — End: 2023-04-27
  Administered 2023-04-24 – 2023-04-27 (×7): 237 mL via ORAL

## 2023-04-24 MED ORDER — SODIUM CHLORIDE 0.9 % IV SOLN
2.0000 g | INTRAVENOUS | Status: DC
  Administered 2023-04-24 – 2023-04-27 (×4): 2 g via INTRAVENOUS
  Filled 2023-04-24 (×4): qty 12.5

## 2023-04-24 MED ORDER — AMLODIPINE BESYLATE 5 MG PO TABS
5.0000 mg | ORAL_TABLET | Freq: Every day | ORAL | Status: DC
  Administered 2023-04-25 – 2023-04-27 (×3): 5 mg via ORAL
  Filled 2023-04-24 (×3): qty 1

## 2023-04-24 NOTE — Plan of Care (Signed)

## 2023-04-24 NOTE — Progress Notes (Signed)
 Cooling/heating blanket was turned to monitor only around 1400 this afternoon. At the time pt's temp was 98.7 rectally. Pt's temp now 96.4 rectally. Blanket turned back on to auto control, set to 98.6 degrees F.
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
## 2023-04-24 NOTE — TOC Progression Note (Signed)
 Transition of Care [facility]) - Progression Note    Patient Details  Name: Terrance Miller MRN: [id number] Date of Birth: [birth year]  [facility] of [facility] CM/SW Contact  Cyndie Dredge, [place] Phone Number: 04/24/2023, 1:33 PM  Clinical Narrative:    This Clinical research associate spoke with daughter about bed offers. Daughter accepted CV bed. [name] made aware of daughter accepting bed offer. Siegfried Dress has been started. TOC to follow.    Expected Discharge Plan: Skilled Nursing Facility Barriers to Discharge: Insurance Authorization  Expected Discharge Plan and Services     Post Acute Care Choice: Durable Medical Equipment Living arrangements for the past 2 months: Single Family Home                 DME Arranged: Over bed frame DME Agency: [facility]       HH Arranged: NA           Social Determinants of Health (SDOH) Interventions SDOH Screenings   Food Insecurity: Patient Unable To Answer (04/21/2023)  Housing: Patient Unable To Answer (04/21/2023)  Transportation Needs: Patient Unable To Answer (04/21/2023)  Utilities: Patient Unable To Answer (04/21/2023)  Alcohol Screen: Low Risk  (01/05/2023)  Depression (PHQ2-9): Medium Risk (03/17/2023)  Financial Resource Strain: Low Risk  (01/05/2023)  Physical Activity: Inactive (01/05/2023)  Social Connections: Patient Unable To Answer (04/21/2023)  Stress: No Stress Concern Present (01/05/2023)  Tobacco Use: Medium Risk (04/22/2023)  Health Literacy: Adequate Health Literacy (01/05/2023)    Readmission Risk Interventions    04/24/2023    1:31 PM 04/23/2023   11:23 AM 04/22/2023   10:47 AM  Readmission Risk Prevention Plan  Transportation Screening Complete Complete Complete  HRI or Home Care Consult Complete Complete Complete  Social Work Consult for Recovery Care Planning/Counseling Complete Complete Complete  Palliative Care Screening Complete Complete Complete  Medication Review Oceanographer) Complete Complete  Complete

## 2023-04-24 NOTE — Progress Notes (Signed)
 PROGRESS NOTE  Terrance Miller, is a 89 y.o. male, DOB - 08-Dec-1928, FMW:[id number]  Admit date - 04/21/2023   Admitting Physician Posey Maier, DO  Outpatient Primary MD for the patient is [name], DO  LOS - 3  Chief Complaint  Patient presents with   Altered Mental Status      Brief Narrative:   89 y.o. male with medical history significant of hypertension, hyperlipidemia, dementia, acute on chronic diastolic CHF, CAD s/p PCI admitted on 04/21/2023 with acute metabolic encephalopathy in setting of UTI and hypothermia    -Assessment and Plan: 1)EnteroBacter Cloacae UTI with Hypothermia-- Treated with Rocephin  -Blood cultures NGTD -- On 04/24/2023 stop IV Rocephin  due to sensitivities of Enterobacter cloacae and treat instead with IV cefepime  while in the hospital anticipate discharge on Cipro   - Okay to try to wean off warming blanket at this time  2)Acute metabolic encephalopathy--presumably due to #1 above -- Mentation back to baseline  3)HFpEF--acute on chronic diastolic dysfunction CHF - Admission BNP 1362 - Echo on 04/22/2023 with EF of 50 to 55%, no regional wall motion abnormalities, mild LVH and moderate pulmonary hypertension noted Treated with IV Lasix  -Creatinine is up to 1.93 from 1.54 on admission -Give diuretic holiday starting 04/24/2023 - Monitor renal function  4)CKD 3B Creatinine on admission 1.54 (this is within baseline range) -Diuretics as above #3 Renally adjust medications, avoid nephrotoxic agents/dehydration/hypotension  5)HTN--- okay to reduce Amlodipine  to 5 mg daily from 10 mg   6)Chronic Thrombocytopenia--- monitor closely, no bleeding noted -Monitor platelets closely with acute infection and antibiotic therapy - No bleeding concerns  7)Macrocytic Anemia Continue vitamin B12 and folic acid  per home regimen   Constipation Continue MiraLAX , senna, Dulcolax per home regimen   BPH Continue Flomax    Obesity class III (BMI  44.37) Patient will be counseled on diet and lifestyle medication when more alert   Generalized weakness and deconditioning---Phy Therapy evaluation appreciated recommends SNF rehab  Status is: Inpatient   Disposition: The patient is from: Home              Anticipated d/c is to: SNF               Anticipated d/c date is: 1 day              Patient currently is not medically stable to d/c. Barriers: Not Clinically Stable-   Code Status :  -  Code Status: Limited: Do not attempt resuscitation (DNR) -DNR-LIMITED -Do Not Intubate/DNI    Family Communication:  I called and updated daughter---Maxime Carter--[phone] DVT Prophylaxis  :   - SCDs  enoxaparin  (LOVENOX ) injection 30 mg Start: 04/24/23 1000 SCDs Start: 04/21/23 2234   Lab Results  Component Value Date   PLT 101 (L) 04/24/2023   Inpatient Medications  Scheduled Meds:  amLODipine   10 mg Oral Daily   aspirin  EC  81 mg Oral Q breakfast   bisacodyl   10 mg Rectal Q M,W,F   Chlorhexidine  Gluconate Cloth  6 each Topical Daily   cyanocobalamin   1,000 mcg Oral Daily   enoxaparin  (LOVENOX ) injection  30 mg Subcutaneous Q24H   feeding supplement  237 mL Oral BID BM   folic acid   1 mg Oral Daily   mupirocin  ointment  1 Application Nasal BID   polyethylene glycol  17 g Oral BID   rosuvastatin   5 mg Oral Daily   tamsulosin   0.4 mg Oral Daily   Continuous Infusions:  cefTRIAXone  (ROCEPHIN )  IV  Stopped (04/23/23 2244)   PRN Meds:.acetaminophen  **OR** acetaminophen , hydrALAZINE , ondansetron  **OR** ondansetron  (ZOFRAN ) IV, senna   Anti-infectives (From admission, onward)    Start     Dose/Rate Route Frequency Ordered Stop   04/22/23 2200  cefTRIAXone  (ROCEPHIN ) 2 g in sodium chloride  0.9 % 100 mL IVPB        2 g 200 mL/hr over 30 Minutes Intravenous Every 24 hours 04/22/23 0850     04/21/23 2000  cefTRIAXone  (ROCEPHIN ) 2 g in sodium chloride  0.9 % 100 mL IVPB        2 g 200 mL/hr over 30 Minutes Intravenous  Once 04/21/23  1949 04/21/23 2136      Subjective: Terrance Miller today has no fevers, no emesis,  No chest pain,    - More coherent -Will attempt to wean off warming blanket  I called and updated daughter---Maxime Carter--[phone] - Objective: Vitals:   04/24/23 0948 04/24/23 1000 04/24/23 1030 04/24/23 1133  BP: (!) 99/33  (!) 106/36   Pulse:  (!) 58 (!) 51   Resp: 16 14 15    Temp:    97.7 F (36.5 C)  TempSrc:    Axillary  SpO2: 96% 94% 96%   Weight:      Height:        Intake/Output Summary (Last 24 hours) at 04/24/2023 1214 Last data filed at 04/24/2023 1208 Gross per 24 hour  Intake 580 ml  Output 1300 ml  Net -720 ml   Filed Weights   04/21/23 1411 04/23/23 0251 04/24/23 0500  Weight: 124.7 kg 96.5 kg 92.7 kg    Physical Exam Gen:-Awake, alert, in no acute distress HEENT:- [place].AT, No sclera icterus Neck-Supple Neck,No JVD,.  Lungs-  CTAB , fair symmetrical air movement CV- S1, S2 normal, regular  Abd-  +ve B.Sounds, Abd Soft, No tenderness, no CVA tenderness Extremity/Skin:- Improving Edema, pedal pulses present  Psych-affect is appropriate, occasional episodes of confusion and disorientation persist Neuro-Generalized weakness,  no new focal deficits, no tremors  Data Reviewed: I have personally reviewed following labs and imaging studies  CBC: Recent Labs  Lab 04/21/23 1431 04/22/23 0245 04/23/23 0540 04/24/23 0403  WBC 5.0 5.3 4.7 4.7  HGB 9.2* 9.0* 8.3* 8.4*  HCT 29.0* 27.7* 25.7* 25.8*  MCV 107.4* 106.5* 104.9* 105.7*  PLT 105* 103* 96* 101*   Basic Metabolic Panel: Recent Labs  Lab 04/21/23 1431 04/22/23 0245 04/23/23 0540 04/24/23 0403  NA 143 142 142 138  K 4.0 3.9 4.0 3.8  CL 112* 115* 108 107  CO2 20* 20* 22 23  GLUCOSE 82 85 77 72  BUN 30* 30* 36* 37*  CREATININE 1.54* 1.61* 1.77* 1.93*  CALCIUM  9.5 9.6 9.3 9.1  MG  --  2.2  --   --   PHOS  --  3.2  --   --    GFR: Estimated Creatinine Clearance: 26 mL/min (A) (by C-G formula based on  SCr of 1.93 mg/dL (H)). Liver Function Tests: Recent Labs  Lab 04/21/23 1431 04/22/23 0245  AST 34 33  ALT 31 30  ALKPHOS 102 98  BILITOT 2.1* 2.0*  PROT 7.9 7.4  ALBUMIN 3.9 3.5    Recent Results (from the past 240 hours)  Culture, blood (routine x 2)     Status: None (Preliminary result)   Collection Time: 04/21/23  2:29 PM   Specimen: BLOOD  Result Value Ref Range Status   Specimen Description BLOOD RIGHT ANTECUBITAL  Final   Special Requests  Final    BOTTLES DRAWN AEROBIC AND ANAEROBIC Blood Culture results may not be optimal due to an inadequate volume of blood received in culture bottles   Culture   Final    NO GROWTH 3 DAYS Performed at [facility], [address] [place], [place] [address]    Report Status PENDING  Incomplete  Culture, blood (routine x 2)     Status: None (Preliminary result)   Collection Time: 04/21/23  4:58 PM   Specimen: BLOOD  Result Value Ref Range Status   Specimen Description BLOOD BLOOD LEFT FOREARM  Final   Special Requests   Final    BOTTLES DRAWN AEROBIC AND ANAEROBIC Blood Culture results may not be optimal due to an inadequate volume of blood received in culture bottles   Culture   Final    NO GROWTH 3 DAYS Performed at [facility], [address], [place], [place] [address]    Report Status PENDING  Incomplete  Urine Culture     Status: Abnormal   Collection Time: 04/21/23  6:32 PM   Specimen: Urine, Random  Result Value Ref Range Status   Specimen Description   Final    URINE, RANDOM Performed at [facility], [address] [place], [place] [address]    Special Requests   Final    NONE Reflexed from [id number] Performed at [facility], [address], [place], [place] [address]    Culture >=100,000 COLONIES/mL ENTEROBACTER CLOACAE (A)  Final   Report Status 04/24/2023 FINAL  Final   Organism ID, Bacteria ENTEROBACTER CLOACAE (A)  Final      Susceptibility   Enterobacter cloacae - MIC*    CEFEPIME  <=0.12 SENSITIVE  Sensitive     CIPROFLOXACIN  <=0.25 SENSITIVE Sensitive     GENTAMICIN <=1 SENSITIVE Sensitive     IMIPENEM 2 SENSITIVE Sensitive     NITROFURANTOIN >=512 RESISTANT Resistant     TRIMETH /SULFA  <=20 SENSITIVE Sensitive     PIP/TAZO <=4 SENSITIVE Sensitive ug/mL    * >=100,000 COLONIES/mL ENTEROBACTER CLOACAE  MRSA Next Gen by PCR, Nasal     Status: Abnormal   Collection Time: 04/21/23 10:50 PM   Specimen: Nasal Mucosa; Nasal Swab  Result Value Ref Range Status   MRSA by PCR Next Gen DETECTED (A) NOT DETECTED Final    Comment: RESULT CALLED TO, READ BACK BY AND VERIFIED WITH: CODY KINDLEY AT 0226 04/22/23 BY A. SNYDER (NOTE) The GeneXpert MRSA Assay (FDA approved for NASAL specimens only), is one component of a comprehensive MRSA colonization surveillance program. It is not intended to diagnose MRSA infection nor to guide or monitor treatment for MRSA infections. Test performance is not FDA approved in patients less than 30 years old. Performed at [facility], [address] [place], [place] [address]     Scheduled Meds:  amLODipine   10 mg Oral Daily   aspirin  EC  81 mg Oral Q breakfast   bisacodyl   10 mg Rectal Q M,W,F   Chlorhexidine  Gluconate Cloth  6 each Topical Daily   cyanocobalamin   1,000 mcg Oral Daily   enoxaparin  (LOVENOX ) injection  30 mg Subcutaneous Q24H   feeding supplement  237 mL Oral BID BM   folic acid   1 mg Oral Daily   mupirocin  ointment  1 Application Nasal BID   polyethylene glycol  17 g Oral BID   rosuvastatin   5 mg Oral Daily   tamsulosin   0.4 mg Oral Daily   Continuous Infusions:  cefTRIAXone  (ROCEPHIN )  IV Stopped (  04/23/23 2244)     LOS: 3 days   Rendall Carwin M.D on 04/24/2023 at 12:14 PM  Go to [email or web address] - for contact info  [facility] - Office  [phone]  If 7PM-7AM, please contact night-coverage [email or web address] 04/24/2023, 12:14 PM

## 2023-04-25 DIAGNOSIS — N1832 Chronic kidney disease, stage 3b: Secondary | ICD-10-CM | POA: Diagnosis not present

## 2023-04-25 DIAGNOSIS — D696 Thrombocytopenia, unspecified: Secondary | ICD-10-CM | POA: Diagnosis not present

## 2023-04-25 DIAGNOSIS — D539 Nutritional anemia, unspecified: Secondary | ICD-10-CM | POA: Diagnosis not present

## 2023-04-25 DIAGNOSIS — N3000 Acute cystitis without hematuria: Secondary | ICD-10-CM | POA: Diagnosis not present

## 2023-04-25 LAB — RENAL FUNCTION PANEL
Albumin: 3.1 g/dL — ABNORMAL LOW (ref 3.5–5.0)
Anion gap: 12 (ref 5–15)
BUN: 38 mg/dL — ABNORMAL HIGH (ref 8–23)
CO2: 25 mmol/L (ref 22–32)
Calcium: 9.2 mg/dL (ref 8.9–10.3)
Chloride: 105 mmol/L (ref 98–111)
Creatinine, Ser: 2.02 mg/dL — ABNORMAL HIGH (ref 0.61–1.24)
GFR, Estimated: 30 mL/min — ABNORMAL LOW (ref >60)
Glucose, Bld: 94 mg/dL (ref 70–99)
Phosphorus: 4 mg/dL (ref 2.5–4.6)
Potassium: 3.9 mmol/L (ref 3.5–5.1)
Sodium: 142 mmol/L (ref 135–145)

## 2023-04-25 LAB — GLUCOSE, CAPILLARY
Glucose-Capillary: 104 mg/dL — ABNORMAL HIGH (ref 70–99)
Glucose-Capillary: 104 mg/dL — ABNORMAL HIGH (ref 70–99)
Glucose-Capillary: 110 mg/dL — ABNORMAL HIGH (ref 70–99)
Glucose-Capillary: 111 mg/dL — ABNORMAL HIGH (ref 70–99)
Glucose-Capillary: 86 mg/dL (ref 70–99)
Glucose-Capillary: 92 mg/dL (ref 70–99)

## 2023-04-25 LAB — TSH: TSH: 2.253 u[IU]/mL (ref 0.350–4.500)

## 2023-04-25 MED ORDER — ORAL CARE MOUTH RINSE: 15.0000 mL | OROMUCOSAL | Status: DC | PRN

## 2023-04-25 NOTE — Progress Notes (Signed)
   04/25/23 1655  Vitals  Temp (!) 94.9 F (34.9 C)  Temp Source Rectal  BP (!) 137/58  MAP (mmHg) 80  BP Method Automatic  Pulse Rate (!) 48  Pulse Rate Source Monitor  Resp 16  Oxygen Therapy  SpO2 99 %  O2 Device Room Air   MD Emokpae notified. Warming blanket ordered. Transfer back to stepdown ordered.

## 2023-04-25 NOTE — Progress Notes (Signed)
 Physical Therapy Treatment Patient Details Name: Terrance Miller MRN: [id number] DOB: Feb 02, 1928 Today's Date: 04/25/2023   History of Present Illness Terrance Miller is a 89 y.o. male with medical history significant of hypertension, hyperlipidemia, dementia, acute on chronic diastolic CHF, CAD s/p PCI who presents emergency department from home via EMS due to altered mental status.  Patient was unable to provide history due to being somnolent, though arousable, but quickly goes back to sleep and also possibly due to underlying dementia.  History was obtained from ED physician and ED medical record.  Per report, family noticed a change in patient's mental status due to about 1 month of patient reporting of seeing things that were not there.  Today, the confusion was worse and EMS was activated, patient was taken to the ED for further evaluation and management.  Patient was said to complain of chronic leg swelling.    PT Comments  Patient demonstrates slow labored movement for sitting up at bedside, increased endurance/distance for gait training with labored movement and limited mostly due to fatigue. Patient had difficulty transferring to chair requiring repeated verbal/tactile for safety due to poor eyesight and weakness. Patient tolerated sitting up in chair after therapy with spouse present. Patient will benefit from continued skilled physical therapy in hospital and recommended venue below to increase strength, balance, endurance for safe ADLs and gait.      If plan is discharge home, recommend the following: A little help with bathing/dressing/bathroom;Help with stairs or ramp for entrance;Assistance with cooking/housework;A lot of help with bathing/dressing/bathroom   Can travel by private vehicle     Yes  Equipment Recommendations  None recommended by PT    Recommendations for Other Services       Precautions / Restrictions Precautions Precautions: Fall Restrictions Weight Bearing  Restrictions Per Provider Order: No     Mobility  Bed Mobility Overal bed mobility: Needs Assistance Bed Mobility: Supine to Sit     Supine to sit: Min assist     General bed mobility comments: increased time, labored movement    Transfers Overall transfer level: Needs assistance Equipment used: Rolling walker (2 wheels) Transfers: Sit to/from Stand, Bed to chair/wheelchair/BSC Sit to Stand: Min assist   Step pivot transfers: Min assist       General transfer comment: requires increased time for transfers mostly due to poor eye sight    Ambulation/Gait Ambulation/Gait assistance: Min assist Gait Distance (Feet): 40 Feet Assistive device: Rolling walker (2 wheels) Gait Pattern/deviations: Decreased step length - right, Decreased step length - left, Decreased stride length Gait velocity: decreased     General Gait Details: increased endurance/distance for gait training with slow labored cadence, no loss of balance and limited mostly due to c/o fatigue   Stairs             Wheelchair Mobility     Tilt Bed    Modified Rankin (Stroke Patients Only)       Balance Overall balance assessment: Needs assistance Sitting-balance support: Feet supported, No upper extremity supported Sitting balance-Leahy Scale: Fair Sitting balance - Comments: fair/good seated at EOB   Standing balance support: Reliant on assistive device for balance, During functional activity, Bilateral upper extremity supported Standing balance-Leahy Scale: Fair Standing balance comment: using RW                            Communication Communication Communication: No apparent difficulties;Other (comment) Factors Affecting Communication:  (Patient has poor  eye sight requiring verbal/tactile curing for safety)  Cognition Arousal: Alert Behavior During Therapy: WFL for tasks assessed/performed   PT - Cognitive impairments: History of cognitive impairments                          Following commands: Intact      Cueing Cueing Techniques: Verbal cues, Tactile cues  Exercises      General Comments        Pertinent Vitals/Pain Pain Assessment Pain Assessment: No/denies pain    Home Living                          Prior Function            PT Goals (current goals can now be found in the care plan section) Acute Rehab PT Goals Patient Stated Goal: return home with family to assist PT Goal Formulation: With patient/family Time For Goal Achievement: 05/07/23 Potential to Achieve Goals: Good Progress towards PT goals: Progressing toward goals    Frequency    Min 3X/week      PT Plan      Co-evaluation              AM-PAC PT "6 Clicks" Mobility   Outcome Measure  Help needed turning from your back to your side while in a flat bed without using bedrails?: A Little Help needed moving from lying on your back to sitting on the side of a flat bed without using bedrails?: A Little Help needed moving to and from a bed to a chair (including a wheelchair)?: A Lot Help needed standing up from a chair using your arms (e.g., wheelchair or bedside chair)?: A Little Help needed to walk in hospital room?: A Little Help needed climbing 3-5 steps with a railing? : A Lot 6 Click Score: 16    End of Session   Activity Tolerance: Patient tolerated treatment well;Patient limited by fatigue Patient left: in chair;with call bell/phone within reach;with family/visitor present Nurse Communication: Mobility status PT Visit Diagnosis: Unsteadiness on feet (R26.81);Other abnormalities of gait and mobility (R26.89);Muscle weakness (generalized) (M62.81)     Time: 9562-1308 PT Time Calculation (min) (ACUTE ONLY): 20 min  Charges:    $Gait Training: 8-22 mins $Therapeutic Activity: 8-22 mins PT General Charges $$ ACUTE PT VISIT: 1 Visit                     2:00 PM, 04/25/23 Walton Guppy, MPT Physical Therapist with  [facility] 336 [phone] office [phone] mobile phone

## 2023-04-25 NOTE — Progress Notes (Signed)
 Patient's daughter called to be made aware that patient was moved up to the telemetry floor. Patient's daughter also took patient's belongings home with her.

## 2023-04-25 NOTE — Progress Notes (Signed)
 Verified with previous nurse Odilia Bennett that patient will be coming back down to ICU stepdown. Patient assigned to ICU room 10.

## 2023-04-25 NOTE — Progress Notes (Signed)
 PROGRESS NOTE  Terrance Miller, is a 89 y.o. male, DOB - 08/22/1928, RUE:[id number]  Admit date - 04/21/2023   Admitting Physician Twilla Galea, DO  Outpatient Primary MD for the patient is Cook, [name], DO  LOS - 4  Chief Complaint  Patient presents with   Altered Mental Status      Brief Narrative:   89 y.o. male with medical history significant of hypertension, hyperlipidemia, dementia, acute on chronic diastolic CHF, CAD s/p PCI admitted on 04/21/2023 with acute metabolic encephalopathy in setting of UTI and hypothermia. - As of 04/25/2023 patient is medically ready for discharge to SNF rehab Awaiting SNF bed and insurance authorization for SNF transfer    -Assessment and Plan: 1)EnteroBacter Cloacae UTI with Hypothermia-- Treated with Rocephin  -Blood cultures NGTD -- On 04/24/2023 stopped IV Rocephin  due to sensitivities of Enterobacter cloacae and treat instead with IV cefepime  while in the hospital anticipate discharge on Cipro   - Weaned off warming blanket at this time  2)Acute metabolic encephalopathy--presumably due to #1 above -- Mentation back to baseline - Patient is cooperative - Oral intake improved  3)HFpEF--acute on chronic diastolic dysfunction CHF - Admission BNP 1362 - Echo on 04/22/2023 with EF of 50 to 55%, no regional wall motion abnormalities, mild LVH and moderate pulmonary hypertension noted Treated with IV Lasix  -Creatinine is up to 2.03 from 1.54 on admission -c/n diuretic holiday started on  04/24/2023 - Monitor renal function  4)CKD 3B Creatinine on admission 1.54 (this is within baseline range) -Diuretics as above #3 Renally adjust medications, avoid nephrotoxic agents/dehydration/hypotension  5)HTN--- okay to reduce Amlodipine  to 5 mg daily from 10 mg   6)Chronic Thrombocytopenia--- monitor closely, no bleeding noted -Monitor platelets closely with acute infection and antibiotic therapy - No bleeding concerns  7) chronic macrocytic  Anemia Continue vitamin B12 and folic acid  per home regimen  Generalized weakness and deconditioning---Phy Therapy evaluation appreciated recommends SNF rehab   Constipation Continue MiraLAX , senna, Dulcolax per home regimen   BPH Continue Flomax    Obesity class III (BMI 44.37) Patient will be counseled on diet and lifestyle medication when more alert    Status is: Inpatient   Disposition: The patient is from: Home              Anticipated d/c is to: SNF               Anticipated d/c date is: 1 day              Patient currently is medically stable to d/c. Barriers: Awaiting SNF bed and insurance authorization for SNF transfer  Code Status :  -  Code Status: Limited: Do not attempt resuscitation (DNR) -DNR-LIMITED -Do Not Intubate/DNI    Family Communication:  I called  daughter---Maxime Carter--[phone] ---on 04/25/23 =---No Answer -I Spoke with daughter on 04/24/2023 DVT Prophylaxis  :   - SCDs  enoxaparin  (LOVENOX ) injection 30 mg Start: 04/24/23 1000 SCDs Start: 04/21/23 2234   Lab Results  Component Value Date   PLT 101 (L) 04/24/2023   Inpatient Medications  Scheduled Meds:  amLODipine   5 mg Oral Daily   aspirin  EC  81 mg Oral Q breakfast   bisacodyl   10 mg Rectal Q M,W,F   Chlorhexidine  Gluconate Cloth  6 each Topical Daily   cyanocobalamin   1,000 mcg Oral Daily   enoxaparin  (LOVENOX ) injection  30 mg Subcutaneous Q24H   feeding supplement  237 mL Oral BID BM   folic acid   1 mg Oral  Daily   mupirocin  ointment  1 Application Nasal BID   polyethylene glycol  17 g Oral BID   rosuvastatin   5 mg Oral Daily   tamsulosin   0.4 mg Oral Daily   Continuous Infusions:  ceFEPime  (MAXIPIME ) IV Stopped (04/25/23 1421)   PRN Meds:.acetaminophen  **OR** acetaminophen , hydrALAZINE , ondansetron  **OR** ondansetron  (ZOFRAN ) IV, senna   Anti-infectives (From admission, onward)    Start     Dose/Rate Route Frequency Ordered Stop   04/24/23 1400  ceFEPIme  (MAXIPIME ) 2 g in  sodium chloride  0.9 % 100 mL IVPB        2 g 200 mL/hr over 30 Minutes Intravenous Every 24 hours 04/24/23 1255     04/22/23 2200  cefTRIAXone  (ROCEPHIN ) 2 g in sodium chloride  0.9 % 100 mL IVPB  Status:  Discontinued        2 g 200 mL/hr over 30 Minutes Intravenous Every 24 hours 04/22/23 0850 04/24/23 1220   04/21/23 2000  cefTRIAXone  (ROCEPHIN ) 2 g in sodium chloride  0.9 % 100 mL IVPB        2 g 200 mL/hr over 30 Minutes Intravenous  Once 04/21/23 1949 04/21/23 2136      Subjective: Terrance Miller today has no fevers, no emesis,  No chest pain,    - More coherent - Oral intake is fair -Weaned off warming blanket  I called  daughter---Maxime Carter--[phone] ---on 04/25/23 =---No Answer -I Spoke with daughter on 04/24/2023 - Objective: Vitals:   04/25/23 1300 04/25/23 1400 04/25/23 1554 04/25/23 1600  BP: (!) 119/40 (!) 113/40  (!) 114/41  Pulse: (!) 51 (!) 45    Resp: 15 11    Temp:   97.9 F (36.6 C)   TempSrc:   Oral   SpO2: 100% 100%    Weight:      Height:        Intake/Output Summary (Last 24 hours) at 04/25/2023 1636 Last data filed at 04/25/2023 1513 Gross per 24 hour  Intake 640 ml  Output 1250 ml  Net -610 ml   Filed Weights   04/23/23 0251 04/24/23 0500 04/25/23 0500  Weight: 96.5 kg 92.7 kg 90.2 kg    Physical Exam Gen:-Awake, alert, in no acute distress HEENT:- [place].AT, No sclera icterus Neck-Supple Neck,No JVD,.  Lungs-  CTAB , fair symmetrical air movement CV- S1, S2 normal, regular  Abd-  +ve B.Sounds, Abd Soft, No tenderness, no CVA tenderness Extremity/Skin:- Improving Edema, pedal pulses present  Psych-affect is appropriate, patient is more Coherent overall, occasional episodes of confusion and disorientation persist Neuro-Generalized weakness,  no new focal deficits, no tremors  Data Reviewed: I have personally reviewed following labs and imaging studies  CBC: Recent Labs  Lab 04/21/23 1431 04/22/23 0245 04/23/23 0540 04/24/23 0403   WBC 5.0 5.3 4.7 4.7  HGB 9.2* 9.0* 8.3* 8.4*  HCT 29.0* 27.7* 25.7* 25.8*  MCV 107.4* 106.5* 104.9* 105.7*  PLT 105* 103* 96* 101*   Basic Metabolic Panel: Recent Labs  Lab 04/21/23 1431 04/22/23 0245 04/23/23 0540 04/24/23 0403 04/25/23 0422  NA 143 142 142 138 142  K 4.0 3.9 4.0 3.8 3.9  CL 112* 115* 108 107 105  CO2 20* 20* 22 23 25   GLUCOSE 82 85 77 72 94  BUN 30* 30* 36* 37* 38*  CREATININE 1.54* 1.61* 1.77* 1.93* 2.02*  CALCIUM  9.5 9.6 9.3 9.1 9.2  MG  --  2.2  --   --   --   PHOS  --  3.2  --   --  4.0   GFR: Estimated Creatinine Clearance: 24.6 mL/min (A) (by C-G formula based on SCr of 2.02 mg/dL (H)). Liver Function Tests: Recent Labs  Lab 04/21/23 1431 04/22/23 0245 04/25/23 0422  AST 34 33  --   ALT 31 30  --   ALKPHOS 102 98  --   BILITOT 2.1* 2.0*  --   PROT 7.9 7.4  --   ALBUMIN 3.9 3.5 3.1*    Recent Results (from the past 240 hours)  Culture, blood (routine x 2)     Status: None (Preliminary result)   Collection Time: 04/21/23  2:29 PM   Specimen: BLOOD  Result Value Ref Range Status   Specimen Description BLOOD RIGHT ANTECUBITAL  Final   Special Requests   Final    BOTTLES DRAWN AEROBIC AND ANAEROBIC Blood Culture results may not be optimal due to an inadequate volume of blood received in culture bottles   Culture   Final    NO GROWTH 4 DAYS Performed at [facility], [address] [place], [place] [address]    Report Status PENDING  Incomplete  Culture, blood (routine x 2)     Status: None (Preliminary result)   Collection Time: 04/21/23  4:58 PM   Specimen: BLOOD  Result Value Ref Range Status   Specimen Description BLOOD BLOOD LEFT FOREARM  Final   Special Requests   Final    BOTTLES DRAWN AEROBIC AND ANAEROBIC Blood Culture results may not be optimal due to an inadequate volume of blood received in culture bottles   Culture   Final    NO GROWTH 4 DAYS Performed at [facility], [address] [place], [place] [address]     Report Status PENDING  Incomplete  Urine Culture     Status: Abnormal   Collection Time: 04/21/23  6:32 PM   Specimen: Urine, Random  Result Value Ref Range Status   Specimen Description   Final    URINE, RANDOM Performed at [facility], [address] [place], [place] [address]    Special Requests   Final    NONE Reflexed from [id number] Performed at [facility], [address], [place], [place] [address]    Culture >=100,000 COLONIES/mL ENTEROBACTER CLOACAE (A)  Final   Report Status 04/24/2023 FINAL  Final   Organism ID, Bacteria ENTEROBACTER CLOACAE (A)  Final      Susceptibility   Enterobacter cloacae - MIC*    CEFEPIME  <=0.12 SENSITIVE Sensitive     CIPROFLOXACIN  <=0.25 SENSITIVE Sensitive     GENTAMICIN <=1 SENSITIVE Sensitive     IMIPENEM 2 SENSITIVE Sensitive     NITROFURANTOIN >=512 RESISTANT Resistant     TRIMETH /SULFA  <=20 SENSITIVE Sensitive     PIP/TAZO <=4 SENSITIVE Sensitive ug/mL    * >=100,000 COLONIES/mL ENTEROBACTER CLOACAE  MRSA Next Gen by PCR, Nasal     Status: Abnormal   Collection Time: 04/21/23 10:50 PM   Specimen: Nasal Mucosa; Nasal Swab  Result Value Ref Range Status   MRSA by PCR Next Gen DETECTED (A) NOT DETECTED Final    Comment: RESULT CALLED TO, READ BACK BY AND VERIFIED WITH: CODY KINDLEY AT 0226 04/22/23 BY A. SNYDER (NOTE) The GeneXpert MRSA Assay (FDA approved for NASAL specimens only), is one component of a comprehensive MRSA colonization surveillance program. It is not intended to diagnose MRSA infection nor to guide or monitor treatment for MRSA infections. Test performance is not FDA approved in patients less than 7 years old. Performed at [facility], [address] [place], [place] [address]     Scheduled Meds:  amLODipine   5 mg Oral Daily   aspirin  EC  81 mg Oral Q breakfast   bisacodyl   10 mg Rectal Q M,W,F   Chlorhexidine  Gluconate Cloth  6 each Topical Daily   cyanocobalamin   1,000 mcg Oral Daily   enoxaparin  (LOVENOX )  injection  30 mg Subcutaneous Q24H   feeding supplement  237 mL Oral BID BM   folic acid   1 mg Oral Daily   mupirocin  ointment  1 Application Nasal BID   polyethylene glycol  17 g Oral BID   rosuvastatin   5 mg Oral Daily   tamsulosin   0.4 mg Oral Daily   Continuous Infusions:  ceFEPime  (MAXIPIME ) IV Stopped (04/25/23 1421)     LOS: 4 days   Colin Dawley M.D on 04/25/2023 at 4:36 PM  Go to [email or web address] - for contact info  [facility] - Office  [phone]  If 7PM-7AM, please contact night-coverage [email or web address] 04/25/2023, 4:36 PM

## 2023-04-26 DIAGNOSIS — N401 Enlarged prostate with lower urinary tract symptoms: Secondary | ICD-10-CM | POA: Diagnosis not present

## 2023-04-26 DIAGNOSIS — I10 Essential (primary) hypertension: Secondary | ICD-10-CM | POA: Diagnosis not present

## 2023-04-26 DIAGNOSIS — T68XXXA Hypothermia, initial encounter: Secondary | ICD-10-CM | POA: Diagnosis not present

## 2023-04-26 DIAGNOSIS — N3000 Acute cystitis without hematuria: Secondary | ICD-10-CM | POA: Diagnosis not present

## 2023-04-26 LAB — CBC
HCT: 25.9 % — ABNORMAL LOW (ref 39.0–52.0)
Hemoglobin: 8.6 g/dL — ABNORMAL LOW (ref 13.0–17.0)
MCH: 35.1 pg — ABNORMAL HIGH (ref 26.0–34.0)
MCHC: 33.2 g/dL (ref 30.0–36.0)
MCV: 105.7 fL — ABNORMAL HIGH (ref 80.0–100.0)
Platelets: 107 10*3/uL — ABNORMAL LOW (ref 150–400)
RBC: 2.45 MIL/uL — ABNORMAL LOW (ref 4.22–5.81)
RDW: 26.1 % — ABNORMAL HIGH (ref 11.5–15.5)
WBC: 3.7 10*3/uL — ABNORMAL LOW (ref 4.0–10.5)
nRBC: 2.2 % — ABNORMAL HIGH (ref 0.0–0.2)

## 2023-04-26 LAB — BASIC METABOLIC PANEL WITH GFR
Anion gap: 9 (ref 5–15)
BUN: 37 mg/dL — ABNORMAL HIGH (ref 8–23)
CO2: 24 mmol/L (ref 22–32)
Calcium: 9.1 mg/dL (ref 8.9–10.3)
Chloride: 107 mmol/L (ref 98–111)
Creatinine, Ser: 1.69 mg/dL — ABNORMAL HIGH (ref 0.61–1.24)
GFR, Estimated: 38 mL/min — ABNORMAL LOW (ref >60)
Glucose, Bld: 97 mg/dL (ref 70–99)
Potassium: 3.9 mmol/L (ref 3.5–5.1)
Sodium: 140 mmol/L (ref 135–145)

## 2023-04-26 LAB — GLUCOSE, CAPILLARY
Glucose-Capillary: 101 mg/dL — ABNORMAL HIGH (ref 70–99)
Glucose-Capillary: 111 mg/dL — ABNORMAL HIGH (ref 70–99)
Glucose-Capillary: 115 mg/dL — ABNORMAL HIGH (ref 70–99)
Glucose-Capillary: 122 mg/dL — ABNORMAL HIGH (ref 70–99)
Glucose-Capillary: 89 mg/dL (ref 70–99)
Glucose-Capillary: 93 mg/dL (ref 70–99)

## 2023-04-26 LAB — CULTURE, BLOOD (ROUTINE X 2)
Culture: NO GROWTH
Culture: NO GROWTH

## 2023-04-26 MED ORDER — CHLORHEXIDINE GLUCONATE CLOTH 2 % EX PADS
6.0000 | MEDICATED_PAD | Freq: Every day | CUTANEOUS | Status: DC
  Administered 2023-04-27: 6 via TOPICAL

## 2023-04-26 NOTE — Progress Notes (Signed)
 PROGRESS NOTE  Terrance Miller, is a 89 y.o. male, DOB - 1928/01/31, RUE:[id number]  Admit date - 04/21/2023   Admitting Physician Twilla Galea, DO  Outpatient Primary MD for the patient is Cook, [name], DO  LOS - 5  Chief Complaint  Patient presents with   Altered Mental Status      Brief Narrative:   89 y.o. male with medical history significant of hypertension, hyperlipidemia, dementia, acute on chronic diastolic CHF, CAD s/p PCI admitted on 04/21/2023 with acute metabolic encephalopathy in setting of UTI and hypothermia. - As of 04/26/2023 patient is medically ready for discharge to SNF rehab Awaiting SNF bed and insurance authorization for SNF transfer    -Assessment and Plan: 1)EnteroBacter Cloacae UTI with Hypothermia-- Treated with Rocephin  -Blood cultures NGTD -- On 04/24/2023 stopped IV Rocephin  due to sensitivities of Enterobacter cloacae and treat instead with IV cefepime  while in the hospital anticipate discharge on Cipro   - Weaned off warming blanket at this time  2)Acute metabolic encephalopathy--presumably due to #1 above -- Mentation back to baseline - Patient is cooperative - Oral intake improved  3)HFpEF--acute on chronic diastolic dysfunction CHF - Admission BNP 1362 - Echo on 04/22/2023 with EF of 50 to 55%, no regional wall motion abnormalities, mild LVH and moderate pulmonary hypertension noted Treated with IV Lasix  -Creatinine was 1.54 on admission -Creatinine peaked at 2.02 -c/n diuretic holiday started on  04/24/2023 -Creatinine is trending down,  currently at 1.69 - Monitor renal function  4)CKD 3B Creatinine on admission 1.54 (this is within baseline range) -Diuretics as above #3 Renally adjust medications, avoid nephrotoxic agents/dehydration/hypotension  5)HTN---   reduced Amlodipine  to 5 mg daily from 10 mg   6)Chronic Thrombocytopenia--- monitor closely, no bleeding noted -Monitor platelets closely with acute infection and antibiotic  therapy - No bleeding concerns  7) chronic macrocytic Anemia Continue vitamin B12 and folic acid  per home regimen  8)Hypothermia---- -Patient required warming blanket overnight -Weaned off warming blanket again around 8 AM this morning --No further hypothermia for now continue to monitor  Generalized weakness and deconditioning---Phy Therapy evaluation appreciated recommends SNF rehab   Constipation Continue MiraLAX , senna, Dulcolax per home regimen   BPH Continue Flomax    Obesity class III (BMI 44.37) Patient will be counseled on diet and lifestyle medication when more alert    Status is: Inpatient   Disposition: The patient is from: Home              Anticipated d/c is to: SNF               Anticipated d/c date is: 1 day              Patient currently is medically stable to d/c. Barriers: Awaiting SNF bed and insurance authorization for SNF transfer  Code Status :  -  Code Status: Limited: Do not attempt resuscitation (DNR) -DNR-LIMITED -Do Not Intubate/DNI    Family Communication:  -  I called and spoke with daughter Ms. Maxime Carter--[phone] on 04/26/23  -I Spoke with daughter on 04/24/2023 DVT Prophylaxis  :   - SCDs  enoxaparin  (LOVENOX ) injection 30 mg Start: 04/24/23 1000 SCDs Start: 04/21/23 2234   Lab Results  Component Value Date   PLT 107 (L) 04/26/2023   Inpatient Medications  Scheduled Meds:  amLODipine   5 mg Oral Daily   aspirin  EC  81 mg Oral Q breakfast   bisacodyl   10 mg Rectal Q M,W,F   cyanocobalamin   1,000 mcg Oral Daily  enoxaparin  (LOVENOX ) injection  30 mg Subcutaneous Q24H   feeding supplement  237 mL Oral BID BM   folic acid   1 mg Oral Daily   polyethylene glycol  17 g Oral BID   rosuvastatin   5 mg Oral Daily   tamsulosin   0.4 mg Oral Daily   Continuous Infusions:  ceFEPime  (MAXIPIME ) IV Stopped (04/25/23 1421)   PRN Meds:.acetaminophen  **OR** acetaminophen , hydrALAZINE , ondansetron  **OR** ondansetron  (ZOFRAN ) IV, mouth rinse,  senna   Anti-infectives (From admission, onward)    Start     Dose/Rate Route Frequency Ordered Stop   04/24/23 1400  ceFEPIme  (MAXIPIME ) 2 g in sodium chloride  0.9 % 100 mL IVPB        2 g 200 mL/hr over 30 Minutes Intravenous Every 24 hours 04/24/23 1255     04/22/23 2200  cefTRIAXone  (ROCEPHIN ) 2 g in sodium chloride  0.9 % 100 mL IVPB  Status:  Discontinued        2 g 200 mL/hr over 30 Minutes Intravenous Every 24 hours 04/22/23 0850 04/24/23 1220   04/21/23 2000  cefTRIAXone  (ROCEPHIN ) 2 g in sodium chloride  0.9 % 100 mL IVPB        2 g 200 mL/hr over 30 Minutes Intravenous  Once 04/21/23 1949 04/21/23 2136      Subjective: Terrance Miller today has no fevers, no emesis,  No chest pain,    - More coherent - Oral intake is fair -Patient required warming blanket overnight -Weaned off warming blanket again around 8 AM this morning --No further hypothermia for now continue to monitor  I called and spoke with daughter---Maxime Carter--[phone] ---on 04/26/23  -  Objective: Vitals:   04/26/23 0900 04/26/23 1200 04/26/23 1222 04/26/23 1300  BP:  (!) 142/64  (!) 128/49  Pulse: 60 (!) 52  (!) 53  Resp: 13 11  17   Temp:   (!) 97.2 F (36.2 C)   TempSrc:   Rectal   SpO2: 98% 100%  100%  Weight:      Height:        Intake/Output Summary (Last 24 hours) at 04/26/2023 1411 Last data filed at 04/26/2023 0900 Gross per 24 hour  Intake 220 ml  Output 500 ml  Net -280 ml   Filed Weights   04/23/23 0251 04/24/23 0500 04/25/23 0500  Weight: 96.5 kg 92.7 kg 90.2 kg    Physical Exam Gen:-Awake, alert, in no acute distress HEENT:- [place].AT, No sclera icterus Neck-Supple Neck,No JVD,.  Lungs-  CTAB , fair symmetrical air movement CV- S1, S2 normal, regular  Abd-  +ve B.Sounds, Abd Soft, No tenderness, no CVA tenderness Extremity/Skin:- Improving Edema, pedal pulses present  Psych-affect is appropriate, patient is more Coherent overall, occasional episodes of confusion and  disorientation persist Neuro-Generalized weakness,  no new focal deficits, no tremors  Data Reviewed: I have personally reviewed following labs and imaging studies  CBC: Recent Labs  Lab 04/21/23 1431 04/22/23 0245 04/23/23 0540 04/24/23 0403 04/26/23 0520  WBC 5.0 5.3 4.7 4.7 3.7*  HGB 9.2* 9.0* 8.3* 8.4* 8.6*  HCT 29.0* 27.7* 25.7* 25.8* 25.9*  MCV 107.4* 106.5* 104.9* 105.7* 105.7*  PLT 105* 103* 96* 101* 107*   Basic Metabolic Panel: Recent Labs  Lab 04/22/23 0245 04/23/23 0540 04/24/23 0403 04/25/23 0422 04/26/23 0520  NA 142 142 138 142 140  K 3.9 4.0 3.8 3.9 3.9  CL 115* 108 107 105 107  CO2 20* 22 23 25 24   GLUCOSE 85 77 72 94 97  BUN  30* 36* 37* 38* 37*  CREATININE 1.61* 1.77* 1.93* 2.02* 1.69*  CALCIUM  9.6 9.3 9.1 9.2 9.1  MG 2.2  --   --   --   --   PHOS 3.2  --   --  4.0  --    GFR: Estimated Creatinine Clearance: 29.3 mL/min (A) (by C-G formula based on SCr of 1.69 mg/dL (H)). Liver Function Tests: Recent Labs  Lab 04/21/23 1431 04/22/23 0245 04/25/23 0422  AST 34 33  --   ALT 31 30  --   ALKPHOS 102 98  --   BILITOT 2.1* 2.0*  --   PROT 7.9 7.4  --   ALBUMIN 3.9 3.5 3.1*    Recent Results (from the past 240 hours)  Culture, blood (routine x 2)     Status: None   Collection Time: 04/21/23  2:29 PM   Specimen: BLOOD  Result Value Ref Range Status   Specimen Description BLOOD RIGHT ANTECUBITAL  Final   Special Requests   Final    BOTTLES DRAWN AEROBIC AND ANAEROBIC Blood Culture results may not be optimal due to an inadequate volume of blood received in culture bottles   Culture   Final    NO GROWTH 5 DAYS Performed at [facility], [address] [place], [place] [address]    Report Status 04/26/2023 FINAL  Final  Culture, blood (routine x 2)     Status: None   Collection Time: 04/21/23  4:58 PM   Specimen: BLOOD  Result Value Ref Range Status   Specimen Description BLOOD BLOOD LEFT FOREARM  Final   Special Requests   Final     BOTTLES DRAWN AEROBIC AND ANAEROBIC Blood Culture results may not be optimal due to an inadequate volume of blood received in culture bottles   Culture   Final    NO GROWTH 5 DAYS Performed at [facility], [address], [place], [place] [address]    Report Status 04/26/2023 FINAL  Final  Urine Culture     Status: Abnormal   Collection Time: 04/21/23  6:32 PM   Specimen: Urine, Random  Result Value Ref Range Status   Specimen Description   Final    URINE, RANDOM Performed at [facility], [address] [place], [place] [address]    Special Requests   Final    NONE Reflexed from [id number] Performed at [facility], [address], [place], [place] [address]    Culture >=100,000 COLONIES/mL ENTEROBACTER CLOACAE (A)  Final   Report Status 04/24/2023 FINAL  Final   Organism ID, Bacteria ENTEROBACTER CLOACAE (A)  Final      Susceptibility   Enterobacter cloacae - MIC*    CEFEPIME  <=0.12 SENSITIVE Sensitive     CIPROFLOXACIN  <=0.25 SENSITIVE Sensitive     GENTAMICIN <=1 SENSITIVE Sensitive     IMIPENEM 2 SENSITIVE Sensitive     NITROFURANTOIN >=512 RESISTANT Resistant     TRIMETH /SULFA  <=20 SENSITIVE Sensitive     PIP/TAZO <=4 SENSITIVE Sensitive ug/mL    * >=100,000 COLONIES/mL ENTEROBACTER CLOACAE  MRSA Next Gen by PCR, Nasal     Status: Abnormal   Collection Time: 04/21/23 10:50 PM   Specimen: Nasal Mucosa; Nasal Swab  Result Value Ref Range Status   MRSA by PCR Next Gen DETECTED (A) NOT DETECTED Final    Comment: RESULT CALLED TO, READ BACK BY AND VERIFIED WITH: CODY KINDLEY AT 0226 04/22/23 BY A. SNYDER (NOTE) The GeneXpert MRSA Assay (FDA approved for NASAL specimens only),  is one component of a comprehensive MRSA colonization surveillance program. It is not intended to diagnose MRSA infection nor to guide or monitor treatment for MRSA infections. Test performance is not FDA approved in patients less than 18 years old. Performed at [facility], [address],  [place], [place] [address]     Scheduled Meds:  amLODipine   5 mg Oral Daily   aspirin  EC  81 mg Oral Q breakfast   bisacodyl   10 mg Rectal Q M,W,F   cyanocobalamin   1,000 mcg Oral Daily   enoxaparin  (LOVENOX ) injection  30 mg Subcutaneous Q24H   feeding supplement  237 mL Oral BID BM   folic acid   1 mg Oral Daily   polyethylene glycol  17 g Oral BID   rosuvastatin   5 mg Oral Daily   tamsulosin   0.4 mg Oral Daily   Continuous Infusions:  ceFEPime  (MAXIPIME ) IV Stopped (04/25/23 1421)     LOS: 5 days   Colin Dawley M.D on 04/26/2023 at 2:11 PM  Go to [email or web address] - for contact info  [facility] - Office  [phone]  If 7PM-7AM, please contact night-coverage [email or web address] 04/26/2023, 2:11 PM

## 2023-04-26 NOTE — Plan of Care (Signed)
  Problem: Education: Goal: Knowledge of General Education information will improve Description: Including pain rating scale, medication(s)/side effects and non-pharmacologic comfort measures Outcome: Progressing   Problem: Health Behavior/Discharge Planning: Goal: Ability to manage health-related needs will improve Outcome: Progressing   Problem: Clinical Measurements: Goal: Will remain free from infection Outcome: Progressing Goal: Diagnostic test results will improve Outcome: Progressing Goal: Respiratory complications will improve Outcome: Progressing Goal: Cardiovascular complication will be avoided Outcome: Progressing   Problem: Coping: Goal: Level of anxiety will decrease Outcome: Progressing

## 2023-04-27 DIAGNOSIS — I130 Hypertensive heart and chronic kidney disease with heart failure and stage 1 through stage 4 chronic kidney disease, or unspecified chronic kidney disease: Secondary | ICD-10-CM | POA: Diagnosis present

## 2023-04-27 DIAGNOSIS — K5289 Other specified noninfective gastroenteritis and colitis: Secondary | ICD-10-CM | POA: Diagnosis not present

## 2023-04-27 DIAGNOSIS — E872 Acidosis, unspecified: Secondary | ICD-10-CM | POA: Diagnosis present

## 2023-04-27 DIAGNOSIS — N390 Urinary tract infection, site not specified: Secondary | ICD-10-CM | POA: Diagnosis present

## 2023-04-27 DIAGNOSIS — R531 Weakness: Secondary | ICD-10-CM | POA: Diagnosis not present

## 2023-04-27 DIAGNOSIS — Z515 Encounter for palliative care: Secondary | ICD-10-CM | POA: Diagnosis not present

## 2023-04-27 DIAGNOSIS — R5381 Other malaise: Secondary | ICD-10-CM | POA: Diagnosis not present

## 2023-04-27 DIAGNOSIS — R932 Abnormal findings on diagnostic imaging of liver and biliary tract: Secondary | ICD-10-CM | POA: Diagnosis not present

## 2023-04-27 DIAGNOSIS — I5033 Acute on chronic diastolic (congestive) heart failure: Secondary | ICD-10-CM | POA: Diagnosis not present

## 2023-04-27 DIAGNOSIS — N1832 Chronic kidney disease, stage 3b: Secondary | ICD-10-CM | POA: Diagnosis present

## 2023-04-27 DIAGNOSIS — K567 Ileus, unspecified: Secondary | ICD-10-CM | POA: Diagnosis present

## 2023-04-27 DIAGNOSIS — H409 Unspecified glaucoma: Secondary | ICD-10-CM | POA: Diagnosis not present

## 2023-04-27 DIAGNOSIS — G9340 Encephalopathy, unspecified: Secondary | ICD-10-CM | POA: Diagnosis not present

## 2023-04-27 DIAGNOSIS — Z7401 Bed confinement status: Secondary | ICD-10-CM | POA: Diagnosis not present

## 2023-04-27 DIAGNOSIS — N401 Enlarged prostate with lower urinary tract symptoms: Secondary | ICD-10-CM | POA: Diagnosis not present

## 2023-04-27 DIAGNOSIS — K56609 Unspecified intestinal obstruction, unspecified as to partial versus complete obstruction: Secondary | ICD-10-CM | POA: Diagnosis not present

## 2023-04-27 DIAGNOSIS — R1312 Dysphagia, oropharyngeal phase: Secondary | ICD-10-CM | POA: Diagnosis not present

## 2023-04-27 DIAGNOSIS — Z743 Need for continuous supervision: Secondary | ICD-10-CM | POA: Diagnosis not present

## 2023-04-27 DIAGNOSIS — D696 Thrombocytopenia, unspecified: Secondary | ICD-10-CM | POA: Diagnosis not present

## 2023-04-27 DIAGNOSIS — D72829 Elevated white blood cell count, unspecified: Secondary | ICD-10-CM | POA: Diagnosis not present

## 2023-04-27 DIAGNOSIS — I5032 Chronic diastolic (congestive) heart failure: Secondary | ICD-10-CM | POA: Diagnosis not present

## 2023-04-27 DIAGNOSIS — N138 Other obstructive and reflux uropathy: Secondary | ICD-10-CM | POA: Diagnosis present

## 2023-04-27 DIAGNOSIS — K5641 Fecal impaction: Secondary | ICD-10-CM | POA: Diagnosis present

## 2023-04-27 DIAGNOSIS — I700 Atherosclerosis of aorta: Secondary | ICD-10-CM | POA: Diagnosis present

## 2023-04-27 DIAGNOSIS — N179 Acute kidney failure, unspecified: Secondary | ICD-10-CM | POA: Diagnosis not present

## 2023-04-27 DIAGNOSIS — R1111 Vomiting without nausea: Secondary | ICD-10-CM | POA: Diagnosis not present

## 2023-04-27 DIAGNOSIS — I2510 Atherosclerotic heart disease of native coronary artery without angina pectoris: Secondary | ICD-10-CM | POA: Diagnosis present

## 2023-04-27 DIAGNOSIS — D472 Monoclonal gammopathy: Secondary | ICD-10-CM | POA: Diagnosis not present

## 2023-04-27 DIAGNOSIS — K4020 Bilateral inguinal hernia, without obstruction or gangrene, not specified as recurrent: Secondary | ICD-10-CM | POA: Diagnosis not present

## 2023-04-27 DIAGNOSIS — R278 Other lack of coordination: Secondary | ICD-10-CM | POA: Diagnosis not present

## 2023-04-27 DIAGNOSIS — R110 Nausea: Secondary | ICD-10-CM | POA: Diagnosis not present

## 2023-04-27 DIAGNOSIS — F0150 Vascular dementia without behavioral disturbance: Secondary | ICD-10-CM | POA: Diagnosis present

## 2023-04-27 DIAGNOSIS — K633 Ulcer of intestine: Secondary | ICD-10-CM | POA: Diagnosis present

## 2023-04-27 DIAGNOSIS — E876 Hypokalemia: Secondary | ICD-10-CM | POA: Diagnosis present

## 2023-04-27 DIAGNOSIS — E1122 Type 2 diabetes mellitus with diabetic chronic kidney disease: Secondary | ICD-10-CM | POA: Diagnosis present

## 2023-04-27 DIAGNOSIS — D539 Nutritional anemia, unspecified: Secondary | ICD-10-CM | POA: Diagnosis present

## 2023-04-27 DIAGNOSIS — Z66 Do not resuscitate: Secondary | ICD-10-CM | POA: Diagnosis present

## 2023-04-27 DIAGNOSIS — F5102 Adjustment insomnia: Secondary | ICD-10-CM | POA: Diagnosis not present

## 2023-04-27 DIAGNOSIS — K5909 Other constipation: Secondary | ICD-10-CM | POA: Diagnosis not present

## 2023-04-27 DIAGNOSIS — Z4682 Encounter for fitting and adjustment of non-vascular catheter: Secondary | ICD-10-CM | POA: Diagnosis not present

## 2023-04-27 DIAGNOSIS — K529 Noninfective gastroenteritis and colitis, unspecified: Secondary | ICD-10-CM | POA: Diagnosis not present

## 2023-04-27 DIAGNOSIS — N3000 Acute cystitis without hematuria: Secondary | ICD-10-CM | POA: Diagnosis not present

## 2023-04-27 DIAGNOSIS — R112 Nausea with vomiting, unspecified: Secondary | ICD-10-CM | POA: Diagnosis not present

## 2023-04-27 DIAGNOSIS — I509 Heart failure, unspecified: Secondary | ICD-10-CM | POA: Diagnosis not present

## 2023-04-27 DIAGNOSIS — M6281 Muscle weakness (generalized): Secondary | ICD-10-CM | POA: Diagnosis not present

## 2023-04-27 DIAGNOSIS — R627 Adult failure to thrive: Secondary | ICD-10-CM | POA: Diagnosis present

## 2023-04-27 DIAGNOSIS — R6521 Severe sepsis with septic shock: Secondary | ICD-10-CM | POA: Diagnosis present

## 2023-04-27 DIAGNOSIS — Z7189 Other specified counseling: Secondary | ICD-10-CM | POA: Diagnosis not present

## 2023-04-27 DIAGNOSIS — E785 Hyperlipidemia, unspecified: Secondary | ICD-10-CM | POA: Diagnosis present

## 2023-04-27 DIAGNOSIS — R509 Fever, unspecified: Secondary | ICD-10-CM | POA: Diagnosis not present

## 2023-04-27 DIAGNOSIS — A419 Sepsis, unspecified organism: Secondary | ICD-10-CM | POA: Diagnosis present

## 2023-04-27 DIAGNOSIS — G9341 Metabolic encephalopathy: Secondary | ICD-10-CM | POA: Diagnosis present

## 2023-04-27 DIAGNOSIS — D649 Anemia, unspecified: Secondary | ICD-10-CM | POA: Diagnosis not present

## 2023-04-27 DIAGNOSIS — R1114 Bilious vomiting: Secondary | ICD-10-CM | POA: Diagnosis present

## 2023-04-27 DIAGNOSIS — E669 Obesity, unspecified: Secondary | ICD-10-CM | POA: Diagnosis present

## 2023-04-27 DIAGNOSIS — I272 Pulmonary hypertension, unspecified: Secondary | ICD-10-CM | POA: Diagnosis present

## 2023-04-27 LAB — GLUCOSE, CAPILLARY
Glucose-Capillary: 116 mg/dL — ABNORMAL HIGH (ref 70–99)
Glucose-Capillary: 87 mg/dL (ref 70–99)
Glucose-Capillary: 95 mg/dL (ref 70–99)

## 2023-04-27 MED ORDER — SENNA 8.6 MG PO TABS: 2.0000 | ORAL_TABLET | Freq: Every day | ORAL | 2 refills | Status: DC

## 2023-04-27 MED ORDER — CIPROFLOXACIN HCL 250 MG PO TABS: 250.0000 mg | ORAL_TABLET | Freq: Two times a day (BID) | ORAL | 0 refills | Status: AC

## 2023-04-27 MED ORDER — CARVEDILOL 3.125 MG PO TABS: 3.1250 mg | ORAL_TABLET | Freq: Two times a day (BID) | ORAL | 5 refills | Status: DC

## 2023-04-27 MED ORDER — ACETAMINOPHEN 325 MG PO TABS: 650.0000 mg | ORAL_TABLET | Freq: Four times a day (QID) | ORAL | Status: DC | PRN

## 2023-04-27 MED ORDER — ASPIRIN 81 MG PO TBEC: 81.0000 mg | DELAYED_RELEASE_TABLET | Freq: Every day | ORAL | 3 refills | Status: DC

## 2023-04-27 MED ORDER — POTASSIUM CHLORIDE 10 MEQ/100ML IV SOLN
INTRAVENOUS | Status: AC
  Filled 2023-04-27: qty 100

## 2023-04-27 MED ORDER — AMLODIPINE BESYLATE 10 MG PO TABS: 10.0000 mg | ORAL_TABLET | Freq: Every day | ORAL | 11 refills | Status: DC

## 2023-04-27 NOTE — Care Management Important Message (Signed)
 Important Message  Patient Details  Name: Terrance Miller MRN: [id number] Date of Birth: 17-Nov-1928   Important Message Given:  Yes - Medicare IM     [name] 04/27/2023, 12:13 PM

## 2023-04-27 NOTE — Discharge Instructions (Signed)
 1)Complete Cipro  antibiotic as prescribed--for Enterobacter urinary tract infection 2)Please CBC and BMP blood tests in about a week from now
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
## 2023-04-27 NOTE — TOC Transition Note (Signed)
 Transition of Care [facility]) - Discharge Note   Patient Details  Name: Terrance Miller MRN: [id number] Date of Birth: 06-13-1928  [facility] CM/SW Contact:  Orelia Binet, RN Phone Number: 04/27/2023, 11:38 AM   Clinical Narrative:   INS AUTH approved SNF. [facility] provided room number, RN calling report and family. CM scheduled EMS.     Final next level of care: Skilled Nursing Facility Barriers to Discharge: Barriers Resolved   Patient Goals and CMS Choice Patient states their goals for this hospitalization and ongoing recovery are:: SNF CMS Medicare.gov Compare Post Acute Care list provided to:: Patient Represenative (must comment) Choice offered to / list presented to : Adult Children      Discharge Placement                Patient to be transferred to facility by: EMS   Patient and family notified of of transfer: 04/27/23  Discharge Plan and Services Additional resources added to the After Visit Summary for       Post Acute Care Choice: Durable Medical Equipment          DME Arranged: Over bed frame DME Agency: [facility]       HH Arranged: NA          Social Drivers of Health (SDOH) Interventions SDOH Screenings   Food Insecurity: Patient Unable To Answer (04/21/2023)  Housing: Patient Unable To Answer (04/21/2023)  Transportation Needs: Patient Unable To Answer (04/21/2023)  Utilities: Patient Unable To Answer (04/21/2023)  Alcohol Screen: Low Risk  (01/05/2023)  Depression (PHQ2-9): Medium Risk (03/17/2023)  Financial Resource Strain: Low Risk  (01/05/2023)  Physical Activity: Inactive (01/05/2023)  Social Connections: Patient Unable To Answer (04/21/2023)  Stress: No Stress Concern Present (01/05/2023)  Tobacco Use: Medium Risk (04/22/2023)  Health Literacy: Adequate Health Literacy (01/05/2023)     Readmission Risk Interventions    04/24/2023    1:31 PM 04/23/2023   11:23 AM 04/22/2023   10:47 AM   Readmission Risk Prevention Plan  Transportation Screening Complete Complete Complete  HRI or Home Care Consult Complete Complete Complete  Social Work Consult for Recovery Care Planning/Counseling Complete Complete Complete  Palliative Care Screening Complete Complete Complete  Medication Review Oceanographer) Complete Complete Complete

## 2023-04-27 NOTE — Progress Notes (Signed)
 Palliative:   Face-to-face conference with bedside nursing staff and transition of care team related to patient condition, needs, goals of care, disposition.  Terrance Miller is lying quietly in bed.  He appears acutely/chronically ill and somewhat frail.  He greets me, making and somewhat keeping eye contact.  He has known dementia but is able to tell me his name.  I do believe that he can make his basic needs known.  There is no family at bedside at this time although bedside nursing staff is present attending to needs.   The plan is for discharge to [facility] under short-term rehab today.  His daughter Terrance Miller shares that she is finding increasingly difficult to care for him.  Need for long-term care would not be surprising.  Plan:   Short-term rehab at [facility].  Anticipate need for long-term care placement.  DNR/goldenrod form completed and placed on chart.  25 minutes  Arla Lab, NP Palliative Medicine Team  Team Phone [phone]

## 2023-04-27 NOTE — Plan of Care (Signed)

## 2023-04-27 NOTE — Progress Notes (Signed)
 [facility] Liaison Note  04/27/2023  Terrance Miller 1928-04-16 [id number]  Location: RN Hospital Liaison screened the patient remotely at [facility].  Insurance: [employer]   Terrance Miller is a 89 y.o. male who is a Primary Care Patient of Cook, [name], DO [facility]. The patient was screened for readmission hospitalization with noted extreme risk score for unplanned readmission risk with 1 IP/1 ED in 6 months.  The patient was assessed for potential Care Management service needs for post hospital transition for care coordination. Review of patient's electronic medical record reveals patient was admitted for UTI. Pt recommended for SNF level of care. Pt transitioned to [facility]. This facility will continue to address pt's ongoing needs.   [facility] Care Management/Population Health does not replace or interfere with any arrangements made by the Inpatient Transition of Care team.   For questions contact:   Lilla Reichert, RN, BSN Hospital Liaison [place]   [facility], Population Health Office Hours MTWF  8:00 am-6:00 pm Direct Dial: [phone] mobile [email or web address]

## 2023-04-27 NOTE — Progress Notes (Signed)
 Per Dr. Charolett Copes, patient to be given cefepime  IV prior to discharge. Cefepime  currently infusing. Report called to [facility]. [phone]. Patient to go to room A11-1. Daughter, [name], called to make aware.

## 2023-04-29 DIAGNOSIS — F5102 Adjustment insomnia: Secondary | ICD-10-CM | POA: Diagnosis not present

## 2023-04-29 DIAGNOSIS — M6281 Muscle weakness (generalized): Secondary | ICD-10-CM | POA: Diagnosis not present

## 2023-04-29 DIAGNOSIS — H409 Unspecified glaucoma: Secondary | ICD-10-CM | POA: Diagnosis not present

## 2023-04-29 DIAGNOSIS — I509 Heart failure, unspecified: Secondary | ICD-10-CM | POA: Diagnosis not present

## 2023-04-29 DIAGNOSIS — I2510 Atherosclerotic heart disease of native coronary artery without angina pectoris: Secondary | ICD-10-CM | POA: Diagnosis not present

## 2023-04-30 ENCOUNTER — Ambulatory Visit: Admitting: Urology

## 2023-04-30 DIAGNOSIS — I509 Heart failure, unspecified: Secondary | ICD-10-CM | POA: Diagnosis not present

## 2023-04-30 DIAGNOSIS — N138 Other obstructive and reflux uropathy: Secondary | ICD-10-CM

## 2023-04-30 DIAGNOSIS — H409 Unspecified glaucoma: Secondary | ICD-10-CM | POA: Diagnosis not present

## 2023-04-30 DIAGNOSIS — M6281 Muscle weakness (generalized): Secondary | ICD-10-CM | POA: Diagnosis not present

## 2023-04-30 DIAGNOSIS — I2510 Atherosclerotic heart disease of native coronary artery without angina pectoris: Secondary | ICD-10-CM | POA: Diagnosis not present

## 2023-05-01 DIAGNOSIS — I509 Heart failure, unspecified: Secondary | ICD-10-CM | POA: Diagnosis not present

## 2023-05-01 DIAGNOSIS — H409 Unspecified glaucoma: Secondary | ICD-10-CM | POA: Diagnosis not present

## 2023-05-01 DIAGNOSIS — I2510 Atherosclerotic heart disease of native coronary artery without angina pectoris: Secondary | ICD-10-CM | POA: Diagnosis not present

## 2023-05-01 DIAGNOSIS — M6281 Muscle weakness (generalized): Secondary | ICD-10-CM | POA: Diagnosis not present

## 2023-05-04 ENCOUNTER — Other Ambulatory Visit: Payer: Self-pay

## 2023-05-04 ENCOUNTER — Inpatient Hospital Stay (HOSPITAL_COMMUNITY)
Admission: EM | Admit: 2023-05-04 | Discharge: 2023-05-12 | DRG: 871 | Disposition: A | Discharge status: Skilled Nursing Facility | Source: Skilled Nursing Facility | Attending: Internal Medicine | Admitting: Internal Medicine

## 2023-05-04 ENCOUNTER — Emergency Department (HOSPITAL_COMMUNITY)

## 2023-05-04 ENCOUNTER — Inpatient Hospital Stay (HOSPITAL_COMMUNITY)

## 2023-05-04 DIAGNOSIS — R932 Abnormal findings on diagnostic imaging of liver and biliary tract: Secondary | ICD-10-CM | POA: Diagnosis not present

## 2023-05-04 DIAGNOSIS — R600 Localized edema: Secondary | ICD-10-CM | POA: Diagnosis present

## 2023-05-04 DIAGNOSIS — I272 Pulmonary hypertension, unspecified: Secondary | ICD-10-CM | POA: Diagnosis present

## 2023-05-04 DIAGNOSIS — E669 Obesity, unspecified: Secondary | ICD-10-CM | POA: Diagnosis present

## 2023-05-04 DIAGNOSIS — K529 Noninfective gastroenteritis and colitis, unspecified: Secondary | ICD-10-CM | POA: Diagnosis not present

## 2023-05-04 DIAGNOSIS — I5032 Chronic diastolic (congestive) heart failure: Secondary | ICD-10-CM | POA: Diagnosis not present

## 2023-05-04 DIAGNOSIS — I130 Hypertensive heart and chronic kidney disease with heart failure and stage 1 through stage 4 chronic kidney disease, or unspecified chronic kidney disease: Secondary | ICD-10-CM | POA: Diagnosis not present

## 2023-05-04 DIAGNOSIS — K567 Ileus, unspecified: Secondary | ICD-10-CM | POA: Diagnosis present

## 2023-05-04 DIAGNOSIS — R627 Adult failure to thrive: Secondary | ICD-10-CM | POA: Diagnosis present

## 2023-05-04 DIAGNOSIS — R278 Other lack of coordination: Secondary | ICD-10-CM | POA: Diagnosis not present

## 2023-05-04 DIAGNOSIS — I10 Essential (primary) hypertension: Secondary | ICD-10-CM | POA: Diagnosis not present

## 2023-05-04 DIAGNOSIS — D696 Thrombocytopenia, unspecified: Secondary | ICD-10-CM | POA: Diagnosis not present

## 2023-05-04 DIAGNOSIS — R1114 Bilious vomiting: Secondary | ICD-10-CM

## 2023-05-04 DIAGNOSIS — I252 Old myocardial infarction: Secondary | ICD-10-CM | POA: Diagnosis not present

## 2023-05-04 DIAGNOSIS — K5289 Other specified noninfective gastroenteritis and colitis: Principal | ICD-10-CM | POA: Diagnosis present

## 2023-05-04 DIAGNOSIS — N183 Chronic kidney disease, stage 3 unspecified: Secondary | ICD-10-CM | POA: Diagnosis not present

## 2023-05-04 DIAGNOSIS — E872 Acidosis, unspecified: Secondary | ICD-10-CM | POA: Diagnosis not present

## 2023-05-04 DIAGNOSIS — I5033 Acute on chronic diastolic (congestive) heart failure: Secondary | ICD-10-CM | POA: Diagnosis not present

## 2023-05-04 DIAGNOSIS — I2510 Atherosclerotic heart disease of native coronary artery without angina pectoris: Secondary | ICD-10-CM | POA: Diagnosis present

## 2023-05-04 DIAGNOSIS — A419 Sepsis, unspecified organism: Principal | ICD-10-CM | POA: Diagnosis present

## 2023-05-04 DIAGNOSIS — K4020 Bilateral inguinal hernia, without obstruction or gangrene, not specified as recurrent: Secondary | ICD-10-CM | POA: Diagnosis not present

## 2023-05-04 DIAGNOSIS — Z743 Need for continuous supervision: Secondary | ICD-10-CM | POA: Diagnosis not present

## 2023-05-04 DIAGNOSIS — N1832 Chronic kidney disease, stage 3b: Secondary | ICD-10-CM | POA: Diagnosis present

## 2023-05-04 DIAGNOSIS — I700 Atherosclerosis of aorta: Secondary | ICD-10-CM | POA: Diagnosis present

## 2023-05-04 DIAGNOSIS — Z79899 Other long term (current) drug therapy: Secondary | ICD-10-CM

## 2023-05-04 DIAGNOSIS — E876 Hypokalemia: Secondary | ICD-10-CM | POA: Diagnosis present

## 2023-05-04 DIAGNOSIS — D72829 Elevated white blood cell count, unspecified: Secondary | ICD-10-CM | POA: Diagnosis not present

## 2023-05-04 DIAGNOSIS — Z604 Social exclusion and rejection: Secondary | ICD-10-CM | POA: Diagnosis present

## 2023-05-04 DIAGNOSIS — Z6831 Body mass index (BMI) 31.0-31.9, adult: Secondary | ICD-10-CM

## 2023-05-04 DIAGNOSIS — Z66 Do not resuscitate: Secondary | ICD-10-CM | POA: Diagnosis present

## 2023-05-04 DIAGNOSIS — R0989 Other specified symptoms and signs involving the circulatory and respiratory systems: Secondary | ICD-10-CM | POA: Diagnosis not present

## 2023-05-04 DIAGNOSIS — E785 Hyperlipidemia, unspecified: Secondary | ICD-10-CM | POA: Diagnosis present

## 2023-05-04 DIAGNOSIS — R6521 Severe sepsis with septic shock: Secondary | ICD-10-CM | POA: Diagnosis not present

## 2023-05-04 DIAGNOSIS — Z833 Family history of diabetes mellitus: Secondary | ICD-10-CM

## 2023-05-04 DIAGNOSIS — Z4682 Encounter for fitting and adjustment of non-vascular catheter: Secondary | ICD-10-CM | POA: Diagnosis not present

## 2023-05-04 DIAGNOSIS — D539 Nutritional anemia, unspecified: Secondary | ICD-10-CM | POA: Diagnosis not present

## 2023-05-04 DIAGNOSIS — K5641 Fecal impaction: Secondary | ICD-10-CM | POA: Diagnosis not present

## 2023-05-04 DIAGNOSIS — R509 Fever, unspecified: Secondary | ICD-10-CM | POA: Diagnosis not present

## 2023-05-04 DIAGNOSIS — N138 Other obstructive and reflux uropathy: Secondary | ICD-10-CM | POA: Diagnosis present

## 2023-05-04 DIAGNOSIS — G9340 Encephalopathy, unspecified: Secondary | ICD-10-CM | POA: Diagnosis not present

## 2023-05-04 DIAGNOSIS — I5030 Unspecified diastolic (congestive) heart failure: Secondary | ICD-10-CM | POA: Diagnosis present

## 2023-05-04 DIAGNOSIS — D472 Monoclonal gammopathy: Secondary | ICD-10-CM | POA: Diagnosis present

## 2023-05-04 DIAGNOSIS — E1122 Type 2 diabetes mellitus with diabetic chronic kidney disease: Secondary | ICD-10-CM | POA: Diagnosis present

## 2023-05-04 DIAGNOSIS — N390 Urinary tract infection, site not specified: Secondary | ICD-10-CM | POA: Diagnosis present

## 2023-05-04 DIAGNOSIS — Z87891 Personal history of nicotine dependence: Secondary | ICD-10-CM

## 2023-05-04 DIAGNOSIS — Z955 Presence of coronary angioplasty implant and graft: Secondary | ICD-10-CM

## 2023-05-04 DIAGNOSIS — K56609 Unspecified intestinal obstruction, unspecified as to partial versus complete obstruction: Secondary | ICD-10-CM | POA: Diagnosis not present

## 2023-05-04 DIAGNOSIS — R112 Nausea with vomiting, unspecified: Secondary | ICD-10-CM | POA: Diagnosis present

## 2023-05-04 DIAGNOSIS — R1111 Vomiting without nausea: Secondary | ICD-10-CM | POA: Diagnosis not present

## 2023-05-04 DIAGNOSIS — J9601 Acute respiratory failure with hypoxia: Secondary | ICD-10-CM | POA: Diagnosis not present

## 2023-05-04 DIAGNOSIS — D649 Anemia, unspecified: Secondary | ICD-10-CM | POA: Diagnosis not present

## 2023-05-04 DIAGNOSIS — Z888 Allergy status to other drugs, medicaments and biological substances status: Secondary | ICD-10-CM

## 2023-05-04 DIAGNOSIS — E1169 Type 2 diabetes mellitus with other specified complication: Secondary | ICD-10-CM | POA: Diagnosis present

## 2023-05-04 DIAGNOSIS — N401 Enlarged prostate with lower urinary tract symptoms: Secondary | ICD-10-CM | POA: Diagnosis not present

## 2023-05-04 DIAGNOSIS — Z9842 Cataract extraction status, left eye: Secondary | ICD-10-CM

## 2023-05-04 DIAGNOSIS — R1312 Dysphagia, oropharyngeal phase: Secondary | ICD-10-CM | POA: Diagnosis not present

## 2023-05-04 DIAGNOSIS — F0150 Vascular dementia without behavioral disturbance: Secondary | ICD-10-CM | POA: Diagnosis present

## 2023-05-04 DIAGNOSIS — H409 Unspecified glaucoma: Secondary | ICD-10-CM | POA: Diagnosis not present

## 2023-05-04 DIAGNOSIS — K633 Ulcer of intestine: Secondary | ICD-10-CM | POA: Diagnosis not present

## 2023-05-04 DIAGNOSIS — Z8249 Family history of ischemic heart disease and other diseases of the circulatory system: Secondary | ICD-10-CM

## 2023-05-04 DIAGNOSIS — G9341 Metabolic encephalopathy: Secondary | ICD-10-CM | POA: Diagnosis present

## 2023-05-04 DIAGNOSIS — Z7982 Long term (current) use of aspirin: Secondary | ICD-10-CM

## 2023-05-04 DIAGNOSIS — K5909 Other constipation: Secondary | ICD-10-CM | POA: Diagnosis not present

## 2023-05-04 DIAGNOSIS — R338 Other retention of urine: Secondary | ICD-10-CM | POA: Diagnosis present

## 2023-05-04 DIAGNOSIS — M6281 Muscle weakness (generalized): Secondary | ICD-10-CM | POA: Diagnosis not present

## 2023-05-04 DIAGNOSIS — R680 Hypothermia, not associated with low environmental temperature: Secondary | ICD-10-CM | POA: Diagnosis present

## 2023-05-04 DIAGNOSIS — Z794 Long term (current) use of insulin: Secondary | ICD-10-CM

## 2023-05-04 DIAGNOSIS — N179 Acute kidney failure, unspecified: Secondary | ICD-10-CM | POA: Diagnosis not present

## 2023-05-04 DIAGNOSIS — Z978 Presence of other specified devices: Secondary | ICD-10-CM | POA: Diagnosis not present

## 2023-05-04 DIAGNOSIS — G319 Degenerative disease of nervous system, unspecified: Secondary | ICD-10-CM | POA: Diagnosis not present

## 2023-05-04 DIAGNOSIS — I351 Nonrheumatic aortic (valve) insufficiency: Secondary | ICD-10-CM | POA: Diagnosis present

## 2023-05-04 DIAGNOSIS — M7989 Other specified soft tissue disorders: Secondary | ICD-10-CM | POA: Diagnosis present

## 2023-05-04 DIAGNOSIS — Z9841 Cataract extraction status, right eye: Secondary | ICD-10-CM

## 2023-05-04 DIAGNOSIS — R110 Nausea: Secondary | ICD-10-CM | POA: Diagnosis not present

## 2023-05-04 DIAGNOSIS — K6389 Other specified diseases of intestine: Secondary | ICD-10-CM | POA: Diagnosis not present

## 2023-05-04 DIAGNOSIS — B9562 Methicillin resistant Staphylococcus aureus infection as the cause of diseases classified elsewhere: Secondary | ICD-10-CM | POA: Diagnosis present

## 2023-05-04 LAB — URINALYSIS, W/ REFLEX TO CULTURE (INFECTION SUSPECTED)
Bilirubin Urine: NEGATIVE
Glucose, UA: NEGATIVE mg/dL
Hgb urine dipstick: NEGATIVE
Ketones, ur: NEGATIVE mg/dL
Leukocytes,Ua: NEGATIVE
Nitrite: NEGATIVE
Protein, ur: 30 mg/dL — AB
Specific Gravity, Urine: 1.018 (ref 1.005–1.030)
pH: 5 (ref 5.0–8.0)

## 2023-05-04 LAB — CBC WITH DIFFERENTIAL/PLATELET
Abs Immature Granulocytes: 0.02 10*3/uL (ref 0.00–0.07)
Basophils Absolute: 0 10*3/uL (ref 0.0–0.1)
Basophils Relative: 0 %
Eosinophils Absolute: 0 10*3/uL (ref 0.0–0.5)
Eosinophils Relative: 0 %
HCT: 34.1 % — ABNORMAL LOW (ref 39.0–52.0)
Hemoglobin: 11.3 g/dL — ABNORMAL LOW (ref 13.0–17.0)
Immature Granulocytes: 0 %
Lymphocytes Relative: 13 %
Lymphs Abs: 0.9 10*3/uL (ref 0.7–4.0)
MCH: 35 pg — ABNORMAL HIGH (ref 26.0–34.0)
MCHC: 33.1 g/dL (ref 30.0–36.0)
MCV: 105.6 fL — ABNORMAL HIGH (ref 80.0–100.0)
Monocytes Absolute: 0.5 10*3/uL (ref 0.1–1.0)
Monocytes Relative: 6 %
Neutro Abs: 5.6 10*3/uL (ref 1.7–7.7)
Neutrophils Relative %: 81 %
Platelets: 146 10*3/uL — ABNORMAL LOW (ref 150–400)
RBC: 3.23 MIL/uL — ABNORMAL LOW (ref 4.22–5.81)
RDW: 25.2 % — ABNORMAL HIGH (ref 11.5–15.5)
WBC: 7 10*3/uL (ref 4.0–10.5)
nRBC: 0.4 % — ABNORMAL HIGH (ref 0.0–0.2)

## 2023-05-04 LAB — COMPREHENSIVE METABOLIC PANEL WITH GFR
ALT: 29 U/L (ref 0–44)
AST: 41 U/L (ref 15–41)
Albumin: 3.7 g/dL (ref 3.5–5.0)
Alkaline Phosphatase: 85 U/L (ref 38–126)
Anion gap: 10 (ref 5–15)
BUN: 39 mg/dL — ABNORMAL HIGH (ref 8–23)
CO2: 22 mmol/L (ref 22–32)
Calcium: 9.4 mg/dL (ref 8.9–10.3)
Chloride: 106 mmol/L (ref 98–111)
Creatinine, Ser: 1.63 mg/dL — ABNORMAL HIGH (ref 0.61–1.24)
GFR, Estimated: 39 mL/min — ABNORMAL LOW (ref >60)
Glucose, Bld: 101 mg/dL — ABNORMAL HIGH (ref 70–99)
Potassium: 4.4 mmol/L (ref 3.5–5.1)
Sodium: 138 mmol/L (ref 135–145)
Total Bilirubin: 1.7 mg/dL — ABNORMAL HIGH (ref 0.0–1.2)
Total Protein: 8.1 g/dL (ref 6.5–8.1)

## 2023-05-04 LAB — HEMOGLOBIN A1C
Hgb A1c MFr Bld: 4.4 % — ABNORMAL LOW (ref 4.8–5.6)
Mean Plasma Glucose: 79.58 mg/dL

## 2023-05-04 LAB — MRSA NEXT GEN BY PCR, NASAL: MRSA by PCR Next Gen: DETECTED — AB

## 2023-05-04 LAB — LIPASE, BLOOD: Lipase: 31 U/L (ref 11–51)

## 2023-05-04 LAB — HEMOGLOBIN AND HEMATOCRIT, BLOOD
HCT: 32.4 % — ABNORMAL LOW (ref 39.0–52.0)
Hemoglobin: 10.5 g/dL — ABNORMAL LOW (ref 13.0–17.0)

## 2023-05-04 LAB — GLUCOSE, CAPILLARY
Glucose-Capillary: 94 mg/dL (ref 70–99)
Glucose-Capillary: 80 mg/dL (ref 70–99)

## 2023-05-04 MED ORDER — LACTATED RINGERS IV BOLUS
1000.0000 mL | Freq: Once | INTRAVENOUS | Status: AC
  Administered 2023-05-04: 1000 mL via INTRAVENOUS

## 2023-05-04 MED ORDER — ACETAMINOPHEN 325 MG PO TABS
650.0000 mg | ORAL_TABLET | Freq: Four times a day (QID) | ORAL | Status: DC | PRN
  Administered 2023-05-10: 650 mg via ORAL
  Filled 2023-05-04: qty 2

## 2023-05-04 MED ORDER — ACETAMINOPHEN 650 MG RE SUPP: 650.0000 mg | Freq: Four times a day (QID) | RECTAL | Status: DC | PRN

## 2023-05-04 MED ORDER — LACTATED RINGERS IV SOLN: INTRAVENOUS | Status: DC

## 2023-05-04 MED ORDER — PROCHLORPERAZINE EDISYLATE 10 MG/2ML IJ SOLN: 10.0000 mg | INTRAMUSCULAR | Status: DC | PRN

## 2023-05-04 MED ORDER — IOHEXOL 9 MG/ML PO SOLN
ORAL | Status: AC
  Filled 2023-05-04: qty 1000

## 2023-05-04 MED ORDER — LACTATED RINGERS IV BOLUS
500.0000 mL | Freq: Once | INTRAVENOUS | Status: AC
Start: 2023-05-05 — End: 2023-05-05
  Administered 2023-05-04: 500 mL via INTRAVENOUS

## 2023-05-04 MED ORDER — MIDODRINE HCL 5 MG PO TABS
10.0000 mg | ORAL_TABLET | Freq: Three times a day (TID) | ORAL | Status: DC
  Administered 2023-05-04 – 2023-05-06 (×6): 10 mg via ORAL
  Filled 2023-05-04 (×6): qty 2

## 2023-05-04 MED ORDER — ONDANSETRON HCL 4 MG PO TABS: 4.0000 mg | ORAL_TABLET | Freq: Four times a day (QID) | ORAL | Status: DC | PRN

## 2023-05-04 MED ORDER — ONDANSETRON HCL 4 MG/2ML IJ SOLN: 4.0000 mg | Freq: Four times a day (QID) | INTRAMUSCULAR | Status: DC | PRN

## 2023-05-04 MED ORDER — FENTANYL CITRATE PF 50 MCG/ML IJ SOSY: 12.5000 ug | PREFILLED_SYRINGE | INTRAMUSCULAR | Status: DC | PRN

## 2023-05-04 MED ORDER — CHLORHEXIDINE GLUCONATE CLOTH 2 % EX PADS
6.0000 | MEDICATED_PAD | Freq: Every day | CUTANEOUS | Status: DC
  Administered 2023-05-04 – 2023-05-08 (×4): 6 via TOPICAL

## 2023-05-04 MED ORDER — LACTATED RINGERS IV BOLUS: 250.0000 mL | Freq: Once | INTRAVENOUS | Status: DC

## 2023-05-04 MED ORDER — MILK AND MOLASSES ENEMA
1.0000 | Freq: Once | RECTAL | Status: AC
  Administered 2023-05-04: 240 mL via RECTAL

## 2023-05-04 MED ORDER — HEPARIN SODIUM (PORCINE) 5000 UNIT/ML IJ SOLN
5000.0000 [IU] | Freq: Three times a day (TID) | INTRAMUSCULAR | Status: DC
  Administered 2023-05-04: 5000 [IU] via SUBCUTANEOUS
  Filled 2023-05-04: qty 1

## 2023-05-04 MED ORDER — ONDANSETRON HCL 4 MG/2ML IJ SOLN
4.0000 mg | Freq: Once | INTRAMUSCULAR | Status: AC
  Administered 2023-05-04: 4 mg via INTRAVENOUS
  Filled 2023-05-04: qty 2

## 2023-05-04 MED ORDER — MUPIROCIN 2 % EX OINT
1.0000 | TOPICAL_OINTMENT | Freq: Two times a day (BID) | CUTANEOUS | Status: AC
  Administered 2023-05-04 – 2023-05-09 (×10): 1 via NASAL
  Filled 2023-05-04 (×2): qty 22

## 2023-05-04 MED ORDER — SODIUM CHLORIDE 0.9 % IV BOLUS
500.0000 mL | Freq: Once | INTRAVENOUS | Status: AC
  Administered 2023-05-04: 500 mL via INTRAVENOUS

## 2023-05-04 MED ORDER — PIPERACILLIN-TAZOBACTAM 3.375 G IVPB
3.3750 g | Freq: Three times a day (TID) | INTRAVENOUS | Status: DC
  Administered 2023-05-04 – 2023-05-09 (×15): 3.375 g via INTRAVENOUS
  Filled 2023-05-04 (×15): qty 50

## 2023-05-04 MED ORDER — PANTOPRAZOLE SODIUM 40 MG IV SOLR
40.0000 mg | Freq: Two times a day (BID) | INTRAVENOUS | Status: DC
  Administered 2023-05-04 – 2023-05-08 (×8): 40 mg via INTRAVENOUS
  Filled 2023-05-04 (×8): qty 10

## 2023-05-04 MED ORDER — INSULIN ASPART 100 UNIT/ML IJ SOLN: 0.0000 [IU] | Freq: Four times a day (QID) | INTRAMUSCULAR | Status: DC

## 2023-05-04 MED ORDER — PANTOPRAZOLE SODIUM 40 MG IV SOLR
40.0000 mg | Freq: Every day | INTRAVENOUS | Status: DC
  Administered 2023-05-04: 40 mg via INTRAVENOUS
  Filled 2023-05-04: qty 10

## 2023-05-04 NOTE — Progress Notes (Addendum)
 Nursing staff report small amount of bright red blood in NG tube. No melena. Suspect due to trauma from insertion. Check H/H now. Increase PPI IV to BID.

## 2023-05-04 NOTE — H&P (Signed)
 History and Physical  [facility]  Rondie Partee VWU:[id number] DOB: 07-18-1928 DOA: 05/04/2023  PCP: System, Provider Not In  Patient coming from: [facility]  Level of care: Stepdown  I have personally briefly reviewed patient's old medical records in [facility] Health Link  Chief Complaint: vomiting   HPI: Terrance Miller is a 89 year old male with advanced vascular dementia currently residing at [facility] with past medical history significant for HFpEF, hyperlipidemia, hypertension, chronic thrombocytopenia, MGUS, glaucoma, chronic constipation, CAD status post PCI, type 2 diabetes mellitus, hyperlipidemia, history of MI in 2004, history of fecal impaction removal in January 2024 by Dr. Sammi Crick, BPH with history of hydrocelectomy by Dr. Oda Bence who was recently discharged from this hospital on 04/27/2023 after treatment for Enterobacter UTI, metabolic encephalopathy.  Patient was sent from [facility] today for nausea and vomiting that started around 5 AM.  Emesis was reported to be very dark in color.  Patient denied abdominal pain but felt bloating in the abdomen.  He is currently undergoing therapy for urinary tract infection.  He was ill-appearing with abdominal distention and tenderness.  CT abdomen demonstrating stercoral colitis with fluid-filled small intestine and stomach.  Surgery and GI was consulted and medicine will be admitting for further management in the hospital.    Past Medical History:  Diagnosis Date   Acute ST elevation myocardial infarction (STEMI) of inferior wall (HCC) 2004   Coronary artery disease    DES x2 to the RCA 2004, residual disease managed medically   Hyperlipidemia    Hypertension    Type 2 diabetes mellitus (HCC)     Past Surgical History:  Procedure Laterality Date   BIOPSY  01/28/2022   Procedure: BIOPSY;  Surgeon: Umberto Ganong, Bearl Limes, MD;  Location: [facility];  Service: Gastroenterology;;   CATARACT EXTRACTION  Bilateral    CORONARY ANGIOPLASTY WITH STENT PLACEMENT  09/05/2002   stent RCA, 80% first diagonal, 70-80% mid-diagonal, 80% mid LAD stenosis   FLEXIBLE SIGMOIDOSCOPY N/A 01/28/2022   Procedure: FLEXIBLE SIGMOIDOSCOPY;  Surgeon: Urban Garden, MD;  Location: [facility] ENDO SUITE;  Service: Gastroenterology;  Laterality: N/A;   HYDROCELE EXCISION Bilateral 11/20/2021   Procedure: HYDROCELECTOMY ADULT;  Surgeon: Mellie Sprinkle., MD;  Location: [facility] ORS;  Service: Urology;  Laterality: Bilateral;   IMPACTION REMOVAL  01/28/2022   Procedure: IMPACTION REMOVAL;  Surgeon: Urban Garden, MD;  Location: [facility];  Service: Gastroenterology;;   NM MYOCAR PERF WALL MOTION  11/01/2008   Normal     reports that he quit smoking about 17 years ago. His smoking use included cigarettes. He has been exposed to tobacco smoke. He has never used smokeless tobacco. He reports that he does not currently use alcohol. He reports that he does not use drugs.  Allergies  Allergen Reactions   Clobetasol  Other (See Comments)    Unknown     Family History  Problem Relation Age of Onset   Hypertension Father    Diabetes Sister     Prior to Admission medications   Medication Sig Start Date End Date Taking? Authorizing Provider  acetaminophen  (TYLENOL ) 325 MG tablet Take 2 tablets (650 mg total) by mouth every 6 (six) hours as needed for mild pain (pain score 1-3) (or Fever >/= 101). 04/27/23   Emokpae, Courage, MD  amLODipine  (NORVASC ) 10 MG tablet Take 1 tablet (10 mg total) by mouth daily. 04/27/23 04/26/24  Colin Dawley, MD  aspirin  EC 81 MG tablet Take 1 tablet (81 mg  total) by mouth daily with breakfast. Swallow whole. 04/27/23   Colin Dawley, MD  bisacodyl  (DULCOLAX) 10 MG suppository Place 1 suppository (10 mg total) rectally every Monday, Wednesday, and Friday. Patient taking differently: Place 10 mg rectally daily as needed for mild constipation. 01/31/22   Colin Dawley, MD   carvedilol  (COREG ) 3.125 MG tablet Take 1 tablet (3.125 mg total) by mouth 2 (two) times daily. 04/27/23   Colin Dawley, MD  cholecalciferol  (VITAMIN D ) 1000 units tablet Take 1,000 Units by mouth daily.    [provider]  cyanocobalamin  (VITAMIN B12) 1000 MCG tablet Take 1,000 mcg by mouth daily.    [provider]  folic acid  (FOLVITE ) 1 MG tablet Take 1 tablet (1 mg total) by mouth daily. 03/17/23   Cook, [name], DO  furosemide  (LASIX ) 20 MG tablet Take 1 tablet (20 mg total) by mouth daily. 08/25/22 08/25/23  Cook, [name], DO  latanoprost  (XALATAN ) 0.005 % ophthalmic solution Place 1 drop into both eyes 2 (two) times daily. 08/20/22   Marilyne Shu, NP  polyethylene glycol (MIRALAX  / GLYCOLAX ) 17 g packet Take 17 g by mouth 2 (two) times daily. Patient taking differently: Take 17 g by mouth daily as needed for mild constipation. 01/31/22   Colin Dawley, MD  rosuvastatin  (CRESTOR ) 5 MG tablet Take 5 mg by mouth daily.    [provider]  senna (SENOKOT) 8.6 MG TABS tablet Take 2 tablets (17.2 mg total) by mouth at bedtime. 04/27/23   Colin Dawley, MD  tamsulosin  (FLOMAX ) 0.4 MG CAPS capsule Take 1 capsule (0.4 mg total) by mouth daily. 08/20/22   Marilyne Shu, NP   Physical Exam: Vitals:   05/04/23 1030 05/04/23 1045 05/04/23 1100 05/04/23 1230  BP: (!) 95/55 (!) 90/55 (!) 92/57 (!) 98/56  Pulse: (!) 47 (!) 51 (!) 52 (!) 53  Resp:  16    Temp:  97.6 F (36.4 C)    TempSrc:  Oral    SpO2: 96% 96% 97% 90%  Weight:      Height:       Constitutional: very frail elderly chronically ill appearing male, uncomfortable, moderately distressed Eyes: PERRL, lids and conjunctivae normal ENMT: Mucous membranes are dry. Posterior pharynx clear of any exudate or lesions. Poor dentition.   Neck: normal, supple, no masses, no thyromegaly Respiratory: BBS shallow, no increased work of breathing.   Cardiovascular: normal s1, s2 sounds, trace pretibial bilateral  lower extremity edema. 2+ pedal pulses. No carotid bruits.  Abdomen: distended; generalized tenderness, no masses palpated. No hepatosplenomegaly. Bowel sounds positive.  Musculoskeletal: no clubbing / cyanosis. No joint deformity upper and lower extremities. Good ROM, no contractures. Normal muscle tone.  Skin: no rashes, lesions, ulcers. No induration Neurologic: CN 2-12 grossly intact. Sensation intact, DTR normal. Strength 5/5 in all 4.  Psychiatric: UTD judgment and insight. Alert and oriented x 2. Flat affect.    Labs on Admission: I have personally reviewed following labs and imaging studies  CBC: Recent Labs  Lab 05/04/23 0708  WBC 7.0  NEUTROABS 5.6  HGB 11.3*  HCT 34.1*  MCV 105.6*  PLT 146*   Basic Metabolic Panel: Recent Labs  Lab 05/04/23 0708  NA 138  K 4.4  CL 106  CO2 22  GLUCOSE 101*  BUN 39*  CREATININE 1.63*  CALCIUM  9.4   GFR: Estimated Creatinine Clearance: 30.8 mL/min (A) (by C-G formula based on SCr of 1.63 mg/dL (H)). Liver Function Tests: Recent Labs  Lab  05/04/23 0708  AST 41  ALT 29  ALKPHOS 85  BILITOT 1.7*  PROT 8.1  ALBUMIN 3.7   Recent Labs  Lab 05/04/23 0708  LIPASE 31   No results for input(s): "AMMONIA" in the last 168 hours. Coagulation Profile: No results for input(s): "INR", "PROTIME" in the last 168 hours. Cardiac Enzymes: No results for input(s): "CKTOTAL", "CKMB", "CKMBINDEX", "TROPONINI" in the last 168 hours. BNP (last 3 results) No results for input(s): "PROBNP" in the last 8760 hours. HbA1C: No results for input(s): "HGBA1C" in the last 72 hours. CBG: No results for input(s): "GLUCAP" in the last 168 hours. Lipid Profile: No results for input(s): "CHOL", "HDL", "LDLCALC", "TRIG", "CHOLHDL", "LDLDIRECT" in the last 72 hours. Thyroid Function Tests: No results for input(s): "TSH", "T4TOTAL", "FREET4", "T3FREE", "THYROIDAB" in the last 72 hours. Anemia Panel: No results for input(s): "VITAMINB12", "FOLATE",  "FERRITIN", "TIBC", "IRON", "RETICCTPCT" in the last 72 hours. Urine analysis:    Component Value Date/Time   COLORURINE YELLOW 05/04/2023 0920   APPEARANCEUR HAZY (A) 05/04/2023 0920   APPEARANCEUR Clear 07/16/2021 1132   LABSPEC 1.018 05/04/2023 0920   PHURINE 5.0 05/04/2023 0920   GLUCOSEU NEGATIVE 05/04/2023 0920   HGBUR NEGATIVE 05/04/2023 0920   BILIRUBINUR NEGATIVE 05/04/2023 0920   BILIRUBINUR Negative 07/16/2021 1132   KETONESUR NEGATIVE 05/04/2023 0920   PROTEINUR 30 (A) 05/04/2023 0920   NITRITE NEGATIVE 05/04/2023 0920   LEUKOCYTESUR NEGATIVE 05/04/2023 0920    Radiological Exams on Admission: CT ABDOMEN PELVIS WO CONTRAST Result Date: 05/04/2023 CLINICAL DATA:  Nausea and vomiting today. Bowel obstruction suspected. EXAM: CT ABDOMEN AND PELVIS WITHOUT CONTRAST TECHNIQUE: Multidetector CT imaging of the abdomen and pelvis was performed following the standard protocol without IV contrast. RADIATION DOSE REDUCTION: This exam was performed according to the departmental dose-optimization program which includes automated exposure control, adjustment of the mA and/or kV according to patient size and/or use of iterative reconstruction technique. COMPARISON:  Abdominopelvic CT 01/27/2022. FINDINGS: Technical note: Despite efforts by the technologist and patient, mild motion artifact is present on today's exam and could not be eliminated. This reduces exam sensitivity and specificity. Lower chest: Mildly increased dependent opacities at both lung bases, most consistent with atelectasis. The distal esophagus is mildly dilated and fluid-filled. There is aortic and coronary artery atherosclerosis. Moderate bilateral gynecomastia noted. Hepatobiliary: No focal hepatic abnormalities are identified on noncontrast imaging. Dependent high density within the gallbladder lumen suspicious for small gallstones. No evidence of gallbladder wall thickening or significant biliary dilatation. Pancreas:  Unremarkable. No pancreatic ductal dilatation or surrounding inflammatory changes. Spleen: Normal in size without focal abnormality. Adrenals/Urinary Tract: Both adrenal glands appear normal. No evidence of urinary tract calculus, suspicious renal lesion or hydronephrosis. Grossly stable renal cysts bilaterally for which no specific follow-up imaging is recommended. The urinary bladder is decompressed without focal abnormality. Stomach/Bowel: No enteric contrast administered. The stomach is moderately distended and fluid-filled. There are multiple mildly dilated loops of fluid-filled small bowel without focal transition point. The proximal colon is fluid-filled without significant distension. There is moderate stool throughout the distal colon. There is mild circumferential rectal wall thickening with perirectal soft tissue stranding. No other bowel wall thickening or surrounding inflammation identified. Vascular/Lymphatic: There are no enlarged abdominal or pelvic lymph nodes. Aortic and branch vessel atherosclerosis without evidence of aneurysm. Reproductive: The prostate gland and seminal vesicles appear unremarkable. Other: Again demonstrated are bilateral inguinal hernias containing fat. The bladder and a small amount of fluid extend into the left inguinal hernia.  There is no definite herniated bowel. No ascites, focal extraluminal fluid collection or pneumoperitoneum. Musculoskeletal: No acute or significant osseous findings. Multilevel spondylosis. IMPRESSION: 1. Mild circumferential rectal wall thickening with perirectal soft tissue stranding, suspicious for recurrent stercoral colitis. 2. The stomach and small bowel are fluid-filled and mildly dilated without focal transition point, likely secondary to constipation or ileus. 3. Bilateral inguinal hernias containing fat. The bladder and a small amount of fluid extend into the left inguinal hernia. No definite herniated bowel. 4. Probable cholelithiasis  without evidence of cholecystitis. 5.  Aortic Atherosclerosis (ICD10-I70.0). Electronically Signed   By: Elmon Hagedorn M.D.   On: 05/04/2023 11:08    Assessment/Plan Active Problems:   Stercoral colitis   Essential hypertension   Hyperlipidemia LDL goal <70   CAD (coronary artery disease)   History of MI (myocardial infarction)   Stage 3b chronic kidney disease (HCC)   MGUS (monoclonal gammopathy of unknown significance)   Aortic regurgitation   Pulmonary HTN (HCC)   BPH with obstruction/lower urinary tract symptoms   Aortic atherosclerosis (HCC)   Chronic constipation   Vascular dementia without behavioral disturbance (HCC)   Type 2 diabetes mellitus with chronic kidney disease, with long-term current use of insulin (HCC)   Glaucoma   Thrombocytopenia (HCC)   (HFpEF) heart failure with preserved ejection fraction (HCC)   Nausea with vomiting   DNR (do not resuscitate)   FTT (failure to thrive) in adult   Recurrent rectal stool impaction Stercoral colitis  Chronic constipation  Ileus from constipation - appreciate GI consultation and recommendations - for now they are starting with molasses enema treatments - per [facility] laxatives only given PRN at the facility  - NGT placed for decompression to low intermittent suction  - general surgery consulted in case there is a perforation requiring operative management - NPO for now - bowel regimen as per GI recommendations  Hypotension  - concern for sepsis  - obtain blood cultures - empirically started IV zosyn   - IV fluid bolus of LR followed by maintenance fluids - admit to stepdown ICU in case pressor therapies are required   Hypothermia - concerning for sepsis presentation - warming blanket ordered - sepsis work up  - follow closely   Chronic HFpEF - appears compensated currently, follow - holding blood pressure lowering agents for now given soft BPs  BPH - he is having urinary retention likely from obstipation -  foley catheter placement ordered   Glaucoma - resume home medication when reconciled   DVT prophylaxis: SCDs  Code Status: DNR   Family Communication: daughter at bedside   Disposition Plan: TBD   Consults called: GI, surgery   Admission status: INP   Critical Care Procedure Note Authorized and Performed by: Olga Berthold MD  Total Critical Care time:  63 mins Due to a high probability of clinically significant, life threatening deterioration, the patient required my highest level of preparedness to intervene emergently and I personally spent this critical care time directly and personally managing the patient.  This critical care time included obtaining a history; examining the patient, pulse oximetry; ordering and review of studies; arranging urgent treatment with development of a management plan; evaluation of patient's response of treatment; frequent reassessment; and discussions with other providers.  This critical care time was performed to assess and manage the high probability of imminent and life threatening deterioration that could result in multi-organ failure.  It was exclusive of separately billable procedures and treating other patients and teaching time.  Level of care: Stepdown Faustino Hook MD [facility] How to contact the [facility] Attending or Consulting provider 7A - 7P or covering provider during after hours 7P -7A, for this patient?  Check the care team in [facility] and look for a) attending/consulting [facility] provider listed and b) the [facility] team listed Log into [email or web address] and use [place]'s universal password to access. If you do not have the password, please contact the hospital operator. Locate the [facility] provider you are looking for under [facility] and page to a number that you can be directly reached. If you still have difficulty reaching the provider, please page the [facility] (Director on Call) for the Hospitalists listed on amion for assistance.   If 7PM-7AM,  please contact night-coverage [email or web address] Password [facility]  05/04/2023, 12:59 PM

## 2023-05-04 NOTE — ED Provider Notes (Signed)
 [facility] [facility] AT [facility] Provider Note   CSN: [id number] Arrival date & time: 05/04/23  4098     History  Chief Complaint  Patient presents with   Nausea   Emesis    Terrance Miller is a 89 y.o. male.  HPI Patient presents in the context of ongoing therapy for urinary tract infection, now with concern for nausea, vomiting. Patient denies pain, acknowledges nausea, but has very coarse speech, which is notable at baseline. Per separate [facility], nursing facility, patient has had nausea, vomiting since about 3 hours ago.  He is receiving medication for recently diagnosed urinary tract infection.  He was discharged 7 days ago.    Home Medications Prior to Admission medications   Medication Sig Start Date End Date Taking? Authorizing Provider  acetaminophen  (TYLENOL ) 325 MG tablet Take 2 tablets (650 mg total) by mouth every 6 (six) hours as needed for mild pain (pain score 1-3) (or Fever >/= 101). 04/27/23   Emokpae, Courage, MD  amLODipine  (NORVASC ) 10 MG tablet Take 1 tablet (10 mg total) by mouth daily. 04/27/23 04/26/24  Colin Dawley, MD  aspirin  EC 81 MG tablet Take 1 tablet (81 mg total) by mouth daily with breakfast. Swallow whole. 04/27/23   Colin Dawley, MD  bisacodyl  (DULCOLAX) 10 MG suppository Place 1 suppository (10 mg total) rectally every Monday, Wednesday, and Friday. Patient taking differently: Place 10 mg rectally daily as needed for mild constipation. 01/31/22   Colin Dawley, MD  carvedilol  (COREG ) 3.125 MG tablet Take 1 tablet (3.125 mg total) by mouth 2 (two) times daily. 04/27/23   Colin Dawley, MD  cholecalciferol  (VITAMIN D ) 1000 units tablet Take 1,000 Units by mouth daily.    [provider]  cyanocobalamin  (VITAMIN B12) 1000 MCG tablet Take 1,000 mcg by mouth daily.    [provider]  folic acid  (FOLVITE ) 1 MG tablet Take 1 tablet (1 mg total) by mouth daily. 03/17/23   Cook, [name], DO  furosemide   (LASIX ) 20 MG tablet Take 1 tablet (20 mg total) by mouth daily. 08/25/22 08/25/23  Cook, [name], DO  latanoprost  (XALATAN ) 0.005 % ophthalmic solution Place 1 drop into both eyes 2 (two) times daily. 08/20/22   Marilyne Shu, NP  polyethylene glycol (MIRALAX  / GLYCOLAX ) 17 g packet Take 17 g by mouth 2 (two) times daily. Patient taking differently: Take 17 g by mouth daily as needed for mild constipation. 01/31/22   Colin Dawley, MD  rosuvastatin  (CRESTOR ) 5 MG tablet Take 5 mg by mouth daily.    [provider]  senna (SENOKOT) 8.6 MG TABS tablet Take 2 tablets (17.2 mg total) by mouth at bedtime. 04/27/23   Colin Dawley, MD  tamsulosin  (FLOMAX ) 0.4 MG CAPS capsule Take 1 capsule (0.4 mg total) by mouth daily. 08/20/22   Marilyne Shu, NP      Allergies    Clobetasol     Review of Systems   Review of Systems  Physical Exam Updated Vital Signs BP (!) 92/57   Pulse (!) 52   Temp 97.6 F (36.4 C) (Oral)   Resp 16   Ht 5\' 6"  (1.676 m)   Wt 92.8 kg   SpO2 97%   BMI 33.02 kg/m  Physical Exam Vitals and nursing note reviewed.  Constitutional:      Appearance: He is well-developed. He is ill-appearing.     Comments: Vomit visible right face, wall,  HENT:     Head: Normocephalic and atraumatic.  Eyes:     Conjunctiva/sclera: Conjunctivae normal.  Cardiovascular:     Rate and Rhythm: Normal rate and regular rhythm.  Pulmonary:     Effort: Pulmonary effort is normal. No respiratory distress.     Breath sounds: No stridor.  Abdominal:     General: There is distension.     Tenderness: There is abdominal tenderness.  Skin:    General: Skin is warm and dry.  Neurological:     Mental Status: He is alert.     Comments: Substantial atrophy, baseline aphasia noted as well, patient does follow commands though.     ED Results / Procedures / Treatments   Labs (all labs ordered are listed, but only abnormal results are displayed) Labs Reviewed  COMPREHENSIVE  METABOLIC PANEL WITH GFR - Abnormal; Notable for the following components:      Result Value   Glucose, Bld 101 (*)    BUN 39 (*)    Creatinine, Ser 1.63 (*)    Total Bilirubin 1.7 (*)    GFR, Estimated 39 (*)    All other components within normal limits  CBC WITH DIFFERENTIAL/PLATELET - Abnormal; Notable for the following components:   RBC 3.23 (*)    Hemoglobin 11.3 (*)    HCT 34.1 (*)    MCV 105.6 (*)    MCH 35.0 (*)    RDW 25.2 (*)    Platelets 146 (*)    nRBC 0.4 (*)    All other components within normal limits  URINALYSIS, W/ REFLEX TO CULTURE (INFECTION SUSPECTED) - Abnormal; Notable for the following components:   APPearance HAZY (*)    Protein, ur 30 (*)    Bacteria, UA RARE (*)    All other components within normal limits  LIPASE, BLOOD    EKG None  Radiology CT ABDOMEN PELVIS WO CONTRAST Result Date: 05/04/2023 CLINICAL DATA:  Nausea and vomiting today. Bowel obstruction suspected. EXAM: CT ABDOMEN AND PELVIS WITHOUT CONTRAST TECHNIQUE: Multidetector CT imaging of the abdomen and pelvis was performed following the standard protocol without IV contrast. RADIATION DOSE REDUCTION: This exam was performed according to the departmental dose-optimization program which includes automated exposure control, adjustment of the mA and/or kV according to patient size and/or use of iterative reconstruction technique. COMPARISON:  Abdominopelvic CT 01/27/2022. FINDINGS: Technical note: Despite efforts by the technologist and patient, mild motion artifact is present on today's exam and could not be eliminated. This reduces exam sensitivity and specificity. Lower chest: Mildly increased dependent opacities at both lung bases, most consistent with atelectasis. The distal esophagus is mildly dilated and fluid-filled. There is aortic and coronary artery atherosclerosis. Moderate bilateral gynecomastia noted. Hepatobiliary: No focal hepatic abnormalities are identified on noncontrast imaging.  Dependent high density within the gallbladder lumen suspicious for small gallstones. No evidence of gallbladder wall thickening or significant biliary dilatation. Pancreas: Unremarkable. No pancreatic ductal dilatation or surrounding inflammatory changes. Spleen: Normal in size without focal abnormality. Adrenals/Urinary Tract: Both adrenal glands appear normal. No evidence of urinary tract calculus, suspicious renal lesion or hydronephrosis. Grossly stable renal cysts bilaterally for which no specific follow-up imaging is recommended. The urinary bladder is decompressed without focal abnormality. Stomach/Bowel: No enteric contrast administered. The stomach is moderately distended and fluid-filled. There are multiple mildly dilated loops of fluid-filled small bowel without focal transition point. The proximal colon is fluid-filled without significant distension. There is moderate stool throughout the distal colon. There is mild circumferential rectal wall thickening with perirectal soft tissue stranding. No other bowel  wall thickening or surrounding inflammation identified. Vascular/Lymphatic: There are no enlarged abdominal or pelvic lymph nodes. Aortic and branch vessel atherosclerosis without evidence of aneurysm. Reproductive: The prostate gland and seminal vesicles appear unremarkable. Other: Again demonstrated are bilateral inguinal hernias containing fat. The bladder and a small amount of fluid extend into the left inguinal hernia. There is no definite herniated bowel. No ascites, focal extraluminal fluid collection or pneumoperitoneum. Musculoskeletal: No acute or significant osseous findings. Multilevel spondylosis. IMPRESSION: 1. Mild circumferential rectal wall thickening with perirectal soft tissue stranding, suspicious for recurrent stercoral colitis. 2. The stomach and small bowel are fluid-filled and mildly dilated without focal transition point, likely secondary to constipation or ileus. 3. Bilateral  inguinal hernias containing fat. The bladder and a small amount of fluid extend into the left inguinal hernia. No definite herniated bowel. 4. Probable cholelithiasis without evidence of cholecystitis. 5.  Aortic Atherosclerosis (ICD10-I70.0). Electronically Signed   By: Elmon Hagedorn M.D.   On: 05/04/2023 11:08    Procedures Procedures    Medications Ordered in ED Medications  sodium chloride  0.9 % bolus 500 mL (has no administration in time range)  ondansetron  (ZOFRAN ) injection 4 mg (4 mg Intravenous Given by Other 05/04/23 4098)    ED Course/ Medical Decision Making/ A&P                                 Medical Decision Making Adult male, ongoing therapy for urinary tract infection presents with nausea, vomiting.  Patient is afebrile, awake and alert, seemingly at baseline neurologically which is noted to be diminished.  Concern for medication effects, return of infection, obstruction. Cardiac 60 sinus normal pulse ox 99% room air normal labs CT antiemetics ordered.  Amount and/or Complexity of Data Reviewed Independent Historian:     Details: Nursing home External Data Reviewed: notes.    Details: Discharge summary from last week Labs: ordered. Decision-making details documented in ED Course. Radiology: ordered and independent interpretation performed. Decision-making details documented in ED Course.  Risk Prescription drug management. Decision regarding hospitalization. Diagnosis or treatment significantly limited by social determinants of health.   12:04 PM Patient accompanied by his daughter.  Patient's labs reviewed, discussed, ongoing mild renal dysfunction, CT considered, reviewed, discussed with the patient's family, concern for stercoral colitis, ileus, with distention in the proximal GI tract.  I discussed his case with our surgical colleagues, and his GI colleagues.  GI will follow as a consulting service given concern for stercoral colitis in the context of chronic  constipation in spite of using MiraLAX .  Patient is Hemoccult negative.        Final Clinical Impression(s) / ED Diagnoses Final diagnoses:  Stercoral colitis  Bilious vomiting with nausea    Rx / DC Orders ED Discharge Orders     None         Dorenda Gandy, MD 05/04/23 1205

## 2023-05-04 NOTE — Progress Notes (Addendum)
 1400 Patient alert to self place and month on room air came from ED Lr bolus running as ordered NG tube placed over noted xray ordered bladder scanned at placed foley 50ml out patient full stool cleaned unable to get a temp rectal 92.6 patient pooped again  1700 patient passed multiple hard clumps of stool with watery stool to follow  1800 patient bladder scanned again due to min urine output 45 ml noted in bladder. Patient having multiple bowel movements

## 2023-05-04 NOTE — ED Notes (Signed)
 EDP informed of pt's bp.

## 2023-05-04 NOTE — ED Triage Notes (Signed)
 Patient from [facility] for N/V that started at 0500. Vomit is dark in color. Patient has a history of slurred speech at baseline. Upon arrival to ER, patient is alert and oriented, denies any pain

## 2023-05-04 NOTE — Plan of Care (Signed)

## 2023-05-04 NOTE — Progress Notes (Signed)
 [facility]  Patient with CT findings of possible stercoral colitis and a related ileus  No surgical indication at this time. Will see tomorrow. Needs bowel regimen. Is a patient of Dr. Castaneda, would like them know about patient given constipation. He had flex sigmoidoscopy 01/2022.   Deena Farrier, MD [facility] [address] [place], [place] [address] [phone] (office)

## 2023-05-04 NOTE — Hospital Course (Addendum)
 89 year old male with advanced vascular dementia currently residing at [facility] with past medical history significant for HFpEF, hyperlipidemia, hypertension, chronic thrombocytopenia, MGUS, glaucoma, chronic constipation, CAD status post PCI, type 2 diabetes mellitus, hyperlipidemia, history of MI in 2004, history of fecal impaction removal in January 2024 by Dr. Sammi Crick, BPH with history of hydrocelectomy by Dr. Oda Bence who was recently discharged from this hospital on 04/27/2023 after treatment for Enterobacter UTI, metabolic encephalopathy.  Patient was sent from [facility] today for nausea and vomiting that started around 5 AM.  Emesis was reported to be very dark in color.  Patient denied abdominal pain but felt bloating in the abdomen.  He is currently undergoing therapy for urinary tract infection.  He was ill-appearing with abdominal distention and tenderness.  CT abdomen demonstrating stercoral colitis with fluid-filled small intestine and stomach.  Surgery and GI was consulted and medicine will be admitting for further management in the hospital.   -NG tube placed on Monday and was given milk of molasses enema with numerous bowel movements thereafter - Monday afternoon removed NG tube but did not have any recurrent vomiting -05/05/23 Hypotensive/hypothermic lactic acidosis -was moved to ICU  -Treated for sepsis, septic shock in ICU setting -05/07/2023 -sepsis physiology resolved  - KUB 4/29 with improvement in colonic ileus - Tolerating clear liquid diet since Wednesday,  - Appetite significantly improved t - Last Bms x  2 today  - 05/08/23 Will advance diet to soft today-resolving sepsis physiology, hypertensive now, adding BP meds, transferred out of ICU
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
## 2023-05-05 ENCOUNTER — Inpatient Hospital Stay (HOSPITAL_COMMUNITY)

## 2023-05-05 DIAGNOSIS — D649 Anemia, unspecified: Secondary | ICD-10-CM | POA: Diagnosis not present

## 2023-05-05 DIAGNOSIS — A419 Sepsis, unspecified organism: Secondary | ICD-10-CM | POA: Diagnosis not present

## 2023-05-05 DIAGNOSIS — K5909 Other constipation: Secondary | ICD-10-CM | POA: Diagnosis not present

## 2023-05-05 DIAGNOSIS — F0150 Vascular dementia without behavioral disturbance: Secondary | ICD-10-CM | POA: Diagnosis not present

## 2023-05-05 DIAGNOSIS — K5289 Other specified noninfective gastroenteritis and colitis: Secondary | ICD-10-CM | POA: Diagnosis not present

## 2023-05-05 DIAGNOSIS — D72829 Elevated white blood cell count, unspecified: Secondary | ICD-10-CM | POA: Diagnosis not present

## 2023-05-05 DIAGNOSIS — R6521 Severe sepsis with septic shock: Secondary | ICD-10-CM

## 2023-05-05 LAB — CBC WITH DIFFERENTIAL/PLATELET
Abs Immature Granulocytes: 0.05 10*3/uL (ref 0.00–0.07)
Basophils Absolute: 0 10*3/uL (ref 0.0–0.1)
Basophils Relative: 0 %
Eosinophils Absolute: 0 10*3/uL (ref 0.0–0.5)
Eosinophils Relative: 0 %
HCT: 29.6 % — ABNORMAL LOW (ref 39.0–52.0)
Hemoglobin: 9.6 g/dL — ABNORMAL LOW (ref 13.0–17.0)
Immature Granulocytes: 0 %
Lymphocytes Relative: 4 %
Lymphs Abs: 0.6 10*3/uL — ABNORMAL LOW (ref 0.7–4.0)
MCH: 34.9 pg — ABNORMAL HIGH (ref 26.0–34.0)
MCHC: 32.4 g/dL (ref 30.0–36.0)
MCV: 107.6 fL — ABNORMAL HIGH (ref 80.0–100.0)
Monocytes Absolute: 1.1 10*3/uL — ABNORMAL HIGH (ref 0.1–1.0)
Monocytes Relative: 7 %
Neutro Abs: 14.5 10*3/uL — ABNORMAL HIGH (ref 1.7–7.7)
Neutrophils Relative %: 89 %
Platelets: 126 10*3/uL — ABNORMAL LOW (ref 150–400)
RBC: 2.75 MIL/uL — ABNORMAL LOW (ref 4.22–5.81)
RDW: 25.2 % — ABNORMAL HIGH (ref 11.5–15.5)
Smear Review: DECREASED
WBC: 16.3 10*3/uL — ABNORMAL HIGH (ref 4.0–10.5)
nRBC: 0.3 % — ABNORMAL HIGH (ref 0.0–0.2)

## 2023-05-05 LAB — COMPREHENSIVE METABOLIC PANEL WITH GFR
ALT: 21 U/L (ref 0–44)
AST: 31 U/L (ref 15–41)
Albumin: 3.1 g/dL — ABNORMAL LOW (ref 3.5–5.0)
Alkaline Phosphatase: 65 U/L (ref 38–126)
Anion gap: 10 (ref 5–15)
BUN: 49 mg/dL — ABNORMAL HIGH (ref 8–23)
CO2: 21 mmol/L — ABNORMAL LOW (ref 22–32)
Calcium: 8.5 mg/dL — ABNORMAL LOW (ref 8.9–10.3)
Chloride: 110 mmol/L (ref 98–111)
Creatinine, Ser: 2.02 mg/dL — ABNORMAL HIGH (ref 0.61–1.24)
GFR, Estimated: 30 mL/min — ABNORMAL LOW (ref >60)
Glucose, Bld: 80 mg/dL (ref 70–99)
Potassium: 4.2 mmol/L (ref 3.5–5.1)
Sodium: 141 mmol/L (ref 135–145)
Total Bilirubin: 2.3 mg/dL — ABNORMAL HIGH (ref 0.0–1.2)
Total Protein: 6.7 g/dL (ref 6.5–8.1)

## 2023-05-05 LAB — MAGNESIUM: Magnesium: 2 mg/dL (ref 1.7–2.4)

## 2023-05-05 LAB — TYPE AND SCREEN
ABO/RH(D): A POS
Antibody Screen: NEGATIVE

## 2023-05-05 LAB — GLUCOSE, CAPILLARY
Glucose-Capillary: 103 mg/dL — ABNORMAL HIGH (ref 70–99)
Glucose-Capillary: 74 mg/dL (ref 70–99)
Glucose-Capillary: 85 mg/dL (ref 70–99)

## 2023-05-05 LAB — LACTIC ACID, PLASMA
Lactic Acid, Venous: 2.2 mmol/L (ref 0.5–1.9)
Lactic Acid, Venous: 1.7 mmol/L (ref 0.5–1.9)

## 2023-05-05 MED ORDER — MELATONIN 3 MG PO TABS
3.0000 mg | ORAL_TABLET | Freq: Every day | ORAL | Status: DC
  Administered 2023-05-05 – 2023-05-06 (×2): 3 mg via ORAL
  Filled 2023-05-05 (×2): qty 1

## 2023-05-05 MED ORDER — VANCOMYCIN HCL 1250 MG/250ML IV SOLN
1250.0000 mg | INTRAVENOUS | Status: AC
  Administered 2023-05-07: 1250 mg via INTRAVENOUS
  Filled 2023-05-05: qty 250

## 2023-05-05 MED ORDER — MEMANTINE HCL 10 MG PO TABS
5.0000 mg | ORAL_TABLET | Freq: Every day | ORAL | Status: DC
  Administered 2023-05-05 – 2023-05-08 (×4): 5 mg via ORAL
  Filled 2023-05-05 (×4): qty 1

## 2023-05-05 MED ORDER — POLYETHYLENE GLYCOL 3350 17 G PO PACK
17.0000 g | PACK | Freq: Three times a day (TID) | ORAL | Status: DC
  Administered 2023-05-05 – 2023-05-11 (×15): 17 g via ORAL
  Filled 2023-05-05 (×17): qty 1

## 2023-05-05 MED ORDER — STERILE WATER FOR INJECTION IJ SOLN
INTRAMUSCULAR | Status: AC
  Administered 2023-05-05: 10 mL
  Filled 2023-05-05: qty 10

## 2023-05-05 MED ORDER — BOOST / RESOURCE BREEZE PO LIQD CUSTOM
1.0000 | Freq: Three times a day (TID) | ORAL | Status: DC
  Administered 2023-05-05 – 2023-05-11 (×12): 1 via ORAL
  Administered 2023-05-11: 237 mL via ORAL

## 2023-05-05 MED ORDER — ASPIRIN 81 MG PO TBEC
81.0000 mg | DELAYED_RELEASE_TABLET | Freq: Every day | ORAL | Status: DC
  Administered 2023-05-06 – 2023-05-07 (×2): 81 mg via ORAL
  Filled 2023-05-05 (×2): qty 1

## 2023-05-05 MED ORDER — LACTATED RINGERS IV SOLN: INTRAVENOUS | Status: DC

## 2023-05-05 MED ORDER — VANCOMYCIN HCL 1500 MG/300ML IV SOLN
1500.0000 mg | Freq: Once | INTRAVENOUS | Status: AC
Start: 2023-05-05 — End: 2023-05-05
  Administered 2023-05-05: 1500 mg via INTRAVENOUS
  Filled 2023-05-05: qty 300

## 2023-05-05 MED ORDER — ENOXAPARIN SODIUM 30 MG/0.3ML IJ SOSY
30.0000 mg | PREFILLED_SYRINGE | INTRAMUSCULAR | Status: DC
  Administered 2023-05-05 – 2023-05-07 (×3): 30 mg via SUBCUTANEOUS
  Filled 2023-05-05 (×3): qty 0.3

## 2023-05-05 MED ORDER — FOLIC ACID 1 MG PO TABS
1.0000 mg | ORAL_TABLET | Freq: Every day | ORAL | Status: DC
  Administered 2023-05-05 – 2023-05-12 (×8): 1 mg via ORAL
  Filled 2023-05-05 (×8): qty 1

## 2023-05-05 MED ORDER — LATANOPROST 0.005 % OP SOLN
1.0000 [drp] | Freq: Every day | OPHTHALMIC | Status: DC
  Administered 2023-05-05 – 2023-05-11 (×7): 1 [drp] via OPHTHALMIC
  Filled 2023-05-05: qty 2.5

## 2023-05-05 MED ORDER — SODIUM CHLORIDE 0.9 % IV SOLN
250.0000 mL | INTRAVENOUS | Status: DC
  Administered 2023-05-05: 250 mL via INTRAVENOUS

## 2023-05-05 MED ORDER — ROSUVASTATIN CALCIUM 10 MG PO TABS
5.0000 mg | ORAL_TABLET | Freq: Every day | ORAL | Status: DC
  Administered 2023-05-05 – 2023-05-12 (×8): 5 mg via ORAL
  Filled 2023-05-05 (×8): qty 1

## 2023-05-05 MED ORDER — TAMSULOSIN HCL 0.4 MG PO CAPS
0.4000 mg | ORAL_CAPSULE | Freq: Every day | ORAL | Status: DC
  Administered 2023-05-05 – 2023-05-10 (×6): 0.4 mg via ORAL
  Filled 2023-05-05 (×6): qty 1

## 2023-05-05 MED ORDER — NOREPINEPHRINE 4 MG/250ML-% IV SOLN
2.0000 ug/min | INTRAVENOUS | Status: DC
  Administered 2023-05-05: 2 ug/min via INTRAVENOUS
  Filled 2023-05-05: qty 250

## 2023-05-05 NOTE — Plan of Care (Signed)
   Problem: Coping: Goal: Level of anxiety will decrease Outcome: Progressing   Problem: Pain Managment: Goal: General experience of comfort will improve and/or be controlled Outcome: Progressing

## 2023-05-05 NOTE — Progress Notes (Signed)
 PROGRESS NOTE   Terrance Miller  ZOX:[id number] DOB: 04-12-1928 DOA: 05/04/2023 PCP: System, Provider Not In   Chief Complaint  Patient presents with   Nausea   Emesis   Level of care: ICU  Brief Admission History:  89 year old male with advanced vascular dementia currently residing at [facility] with past medical history significant for HFpEF, hyperlipidemia, hypertension, chronic thrombocytopenia, MGUS, glaucoma, chronic constipation, CAD status post PCI, type 2 diabetes mellitus, hyperlipidemia, history of MI in 2004, history of fecal impaction removal in January 2024 by Dr. Sammi Crick, BPH with history of hydrocelectomy by Dr. Oda Bence who was recently discharged from this hospital on 04/27/2023 after treatment for Enterobacter UTI, metabolic encephalopathy.  Patient was sent from [facility] today for nausea and vomiting that started around 5 AM.  Emesis was reported to be very dark in color.  Patient denied abdominal pain but felt bloating in the abdomen.  He is currently undergoing therapy for urinary tract infection.  He was ill-appearing with abdominal distention and tenderness.  CT abdomen demonstrating stercoral colitis with fluid-filled small intestine and stomach.  Surgery and GI was consulted and medicine will be admitting for further management in the hospital.   Assessment and Plan:  Severe Sepsis with septic Shock - pt became hypotensive overnight and required IV norepinephrine infusion - he was bolused with LR for lactic acidosis - he remains on IV antibiotics added vancomycin  given MRSA screen positive - blood cultures are pending - he is on a warming blanket to improve core temperature   Recurrent rectal stool impaction Stercoral colitis  Chronic constipation  Ileus from constipation - appreciate GI consultation and recommendations - for now they are starting with molasses enema treatments - per [facility] laxatives only given PRN at the facility  - NGT  placed for decompression to low intermittent suction but patient pulled out overnight - general surgery consulted in case there is a perforation requiring operative management - discussed with GI service, ok to start clears diet and monitor closely  - bowel regimen as per GI recommendations   Hypotension - Improving  - secondary to severe sepsis with septic shock  - was briefly on IV norepinephrine infusion, now on oral midodrine 10 mg TID for BP support - follow blood cultures, no growth to date  - continue IV zosyn  added vancomycin  - IV fluid bolus of LR followed by maintenance fluids - admit to stepdown ICU in case pressor therapies are required    Hypothermia - Improving  - secondary to severe sepsis with septic shock - warming blanket ordered - core body temperature improving    Chronic HFpEF - appears compensated currently, follow - holding blood pressure lowering agents for now given soft BPs   BPH - he is having urinary retention likely from obstipation - foley catheter placement ordered and urine output being monitoried - remains critically ill in ICU so we will continue foley for now   Glaucoma - resumed home medication   DVT prophylaxis: enoxaparin  Code Status: DNR  Family Communication: daughter Disposition: TBD but likely to return to LTC at [facility]   Consultants:  GI Procedures:   Antimicrobials:    Subjective: Pt reporting that he is having a very dry mouth and wants to drink.   Objective: Vitals:   05/05/23 1015 05/05/23 1030 05/05/23 1045 05/05/23 1100  BP: (!) 111/58 (!) 128/46 (!) 100/42 (!) 111/46  Pulse: 60 (!) 57 (!) 59 63  Resp: 13 11 13 15   Temp:  TempSrc:      SpO2: 94% 97% 96% 98%  Weight:      Height:        Intake/Output Summary (Last 24 hours) at 05/05/2023 1137 Last data filed at 05/05/2023 1100 Gross per 24 hour  Intake 5012.99 ml  Output 1100 ml  Net 3912.99 ml   Filed Weights   05/04/23 0652 05/04/23 1400   Weight: 92.8 kg 82.5 kg   Examination:  General exam: Appears calm and comfortable  Respiratory system: Clear to auscultation. Respiratory effort normal. Cardiovascular system: normal S1 & S2 heard. No JVD, murmurs, rubs, gallops or clicks. No pedal edema. Gastrointestinal system: Abdomen is mildly distended but much improved from prior, and with generalized tenderness. No organomegaly or masses felt. Normal bowel sounds heard. Central nervous system: Alert and oriented. No focal neurological deficits. Extremities: Symmetric 5 x 5 power. Skin: No rashes, lesions or ulcers. Psychiatry: Judgement and insight appear normal. Mood & affect appropriate.   Data Reviewed: I have personally reviewed following labs and imaging studies  CBC: Recent Labs  Lab 05/04/23 0708 05/04/23 1522 05/05/23 0201  WBC 7.0  --  16.3*  NEUTROABS 5.6  --  14.5*  HGB 11.3* 10.5* 9.6*  HCT 34.1* 32.4* 29.6*  MCV 105.6*  --  107.6*  PLT 146*  --  126*    Basic Metabolic Panel: Recent Labs  Lab 05/04/23 0708 05/05/23 0201  NA 138 141  K 4.4 4.2  CL 106 110  CO2 22 21*  GLUCOSE 101* 80  BUN 39* 49*  CREATININE 1.63* 2.02*  CALCIUM  9.4 8.5*  MG  --  2.0    CBG: Recent Labs  Lab 05/04/23 1336 05/04/23 1844 05/05/23 0004 05/05/23 0605  GLUCAP 94 80 74 103*    Recent Results (from the past 240 hours)  Culture, blood (Routine X 2) w Reflex to ID Panel     Status: None (Preliminary result)   Collection Time: 05/04/23  1:09 PM   Specimen: BLOOD  Result Value Ref Range Status   Specimen Description BLOOD BLOOD LEFT HAND  Final   Special Requests NONE  Final   Culture   Final    NO GROWTH < 24 HOURS Performed at [facility], [address], [place], [place] [address]    Report Status PENDING  Incomplete  Culture, blood (Routine X 2) w Reflex to ID Panel     Status: None (Preliminary result)   Collection Time: 05/04/23  1:25 PM   Specimen: BLOOD  Result Value Ref Range Status   Specimen  Description BLOOD BLOOD RIGHT HAND  Final   Special Requests   Final    BOTTLES DRAWN AEROBIC AND ANAEROBIC Blood Culture results may not be optimal due to an inadequate volume of blood received in culture bottles   Culture   Final    NO GROWTH < 24 HOURS Performed at [facility], [address] [place], [place] [address]    Report Status PENDING  Incomplete  MRSA Next Gen by PCR, Nasal     Status: Abnormal   Collection Time: 05/04/23  1:36 PM   Specimen: Nasal Mucosa; Nasal Swab  Result Value Ref Range Status   MRSA by PCR Next Gen DETECTED (A) NOT DETECTED Final    Comment: RESULT CALLED TO, READ BACK BY AND VERIFIED WITH: L IRVING AT 1925 ON [id number] BY S DALTON (NOTE) The GeneXpert MRSA Assay (FDA approved for NASAL specimens only), is one component of a comprehensive MRSA colonization  surveillance program. It is not intended to diagnose MRSA infection nor to guide or monitor treatment for MRSA infections. Test performance is not FDA approved in patients less than 88 years old. Performed at [facility], [address] [place], [place] [address]      Radiology Studies: DG Abd 1 View Result Date: 05/05/2023 CLINICAL DATA:  Ileus. EXAM: ABDOMEN - 1 VIEW COMPARISON:  05/04/2023 FINDINGS: Temperature probe is now seen within the urinary bladder. Gaseous distention of the colon shows mild decrease since previous study, suggesting improving ileus. IMPRESSION: Mild improvement in colonic ileus. Electronically Signed   By: Marlyce Sine M.D.   On: 05/05/2023 08:27   DG Abd 1 View Result Date: 05/04/2023 CLINICAL DATA:  Nasogastric tube placement. EXAM: ABDOMEN - 1 VIEW COMPARISON:  CT earlier today FINDINGS: Tip and side port of the enteric tube is below the diaphragm in the stomach. The small-bowel distention on CT is not seen on the current exam. Mild gaseous distention of colon. Left colonic stool persists. Radiopaque densities in the left abdomen air in the subcutaneous tissues on C2  IMPRESSION: Tip and side port of the enteric tube below the diaphragm in the stomach. Electronically Signed   By: Chadwick Colonel M.D.   On: 05/04/2023 16:47   CT ABDOMEN PELVIS WO CONTRAST Result Date: 05/04/2023 CLINICAL DATA:  Nausea and vomiting today. Bowel obstruction suspected. EXAM: CT ABDOMEN AND PELVIS WITHOUT CONTRAST TECHNIQUE: Multidetector CT imaging of the abdomen and pelvis was performed following the standard protocol without IV contrast. RADIATION DOSE REDUCTION: This exam was performed according to the departmental dose-optimization program which includes automated exposure control, adjustment of the mA and/or kV according to patient size and/or use of iterative reconstruction technique. COMPARISON:  Abdominopelvic CT 01/27/2022. FINDINGS: Technical note: Despite efforts by the technologist and patient, mild motion artifact is present on today's exam and could not be eliminated. This reduces exam sensitivity and specificity. Lower chest: Mildly increased dependent opacities at both lung bases, most consistent with atelectasis. The distal esophagus is mildly dilated and fluid-filled. There is aortic and coronary artery atherosclerosis. Moderate bilateral gynecomastia noted. Hepatobiliary: No focal hepatic abnormalities are identified on noncontrast imaging. Dependent high density within the gallbladder lumen suspicious for small gallstones. No evidence of gallbladder wall thickening or significant biliary dilatation. Pancreas: Unremarkable. No pancreatic ductal dilatation or surrounding inflammatory changes. Spleen: Normal in size without focal abnormality. Adrenals/Urinary Tract: Both adrenal glands appear normal. No evidence of urinary tract calculus, suspicious renal lesion or hydronephrosis. Grossly stable renal cysts bilaterally for which no specific follow-up imaging is recommended. The urinary bladder is decompressed without focal abnormality. Stomach/Bowel: No enteric contrast  administered. The stomach is moderately distended and fluid-filled. There are multiple mildly dilated loops of fluid-filled small bowel without focal transition point. The proximal colon is fluid-filled without significant distension. There is moderate stool throughout the distal colon. There is mild circumferential rectal wall thickening with perirectal soft tissue stranding. No other bowel wall thickening or surrounding inflammation identified. Vascular/Lymphatic: There are no enlarged abdominal or pelvic lymph nodes. Aortic and branch vessel atherosclerosis without evidence of aneurysm. Reproductive: The prostate gland and seminal vesicles appear unremarkable. Other: Again demonstrated are bilateral inguinal hernias containing fat. The bladder and a small amount of fluid extend into the left inguinal hernia. There is no definite herniated bowel. No ascites, focal extraluminal fluid collection or pneumoperitoneum. Musculoskeletal: No acute or significant osseous findings. Multilevel spondylosis. IMPRESSION: 1. Mild circumferential rectal wall thickening with perirectal soft tissue  stranding, suspicious for recurrent stercoral colitis. 2. The stomach and small bowel are fluid-filled and mildly dilated without focal transition point, likely secondary to constipation or ileus. 3. Bilateral inguinal hernias containing fat. The bladder and a small amount of fluid extend into the left inguinal hernia. No definite herniated bowel. 4. Probable cholelithiasis without evidence of cholecystitis. 5.  Aortic Atherosclerosis (ICD10-I70.0). Electronically Signed   By: Elmon Hagedorn M.D.   On: 05/04/2023 11:08    Scheduled Meds:  Chlorhexidine  Gluconate Cloth  6 each Topical Daily   midodrine  10 mg Oral TID WC   mupirocin  ointment  1 Application Nasal BID   pantoprazole (PROTONIX) IV  40 mg Intravenous Q12H   polyethylene glycol  17 g Oral TID   Continuous Infusions:  sodium chloride  10 mL/hr at 05/05/23 1100    lactated ringers  125 mL/hr at 05/05/23 1100   norepinephrine (LEVOPHED) Adult infusion Stopped (05/05/23 0831)   piperacillin -tazobactam (ZOSYN )  IV Stopped (05/05/23 1017)     LOS: 1 day   Critical Care Procedure Note Authorized and Performed by: Olga Berthold MD  Total Critical Care time:  58 mins Due to a high probability of clinically significant, life threatening deterioration, the patient required my highest level of preparedness to intervene emergently and I personally spent this critical care time directly and personally managing the patient.  This critical care time included obtaining a history; examining the patient, pulse oximetry; ordering and review of studies; arranging urgent treatment with development of a management plan; evaluation of patient's response of treatment; frequent reassessment; and discussions with other providers.  This critical care time was performed to assess and manage the high probability of imminent and life threatening deterioration that could result in multi-organ failure.  It was exclusive of separately billable procedures and treating other patients and teaching time.   Faustino Hook, MD How to contact the [facility] Attending or Consulting provider 7A - 7P or covering provider during after hours 7P -7A, for this patient?  Check the care team in [facility] and look for a) attending/consulting [facility] provider listed and b) the [facility] team listed Log into [email or web address] to find provider on call.  Locate the [facility] provider you are looking for under [facility] and page to a number that you can be directly reached. If you still have difficulty reaching the provider, please page the [facility] (Director on Call) for the Hospitalists listed on amion for assistance.  05/05/2023, 11:37 AM

## 2023-05-05 NOTE — Plan of Care (Signed)
  Problem: Education: Goal: Knowledge of General Education information will improve Description: Including pain rating scale, medication(s)/side effects and non-pharmacologic comfort measures Outcome: Not Progressing   Problem: Health Behavior/Discharge Planning: Goal: Ability to manage health-related needs will improve Outcome: Not Progressing   Problem: Clinical Measurements: Goal: Ability to maintain clinical measurements within normal limits will improve Outcome: Not Progressing Goal: Will remain free from infection Outcome: Not Progressing Goal: Diagnostic test results will improve Outcome: Not Progressing Goal: Respiratory complications will improve Outcome: Progressing Goal: Cardiovascular complication will be avoided Outcome: Not Progressing   Problem: Activity: Goal: Risk for activity intolerance will decrease Outcome: Progressing   Problem: Nutrition: Goal: Adequate nutrition will be maintained Outcome: Not Progressing   Problem: Coping: Goal: Level of anxiety will decrease Outcome: Progressing   Problem: Elimination: Goal: Will not experience complications related to bowel motility Outcome: Not Progressing Goal: Will not experience complications related to urinary retention Outcome: Not Progressing   Problem: Pain Managment: Goal: General experience of comfort will improve and/or be controlled Outcome: Progressing   Problem: Safety: Goal: Ability to remain free from injury will improve Outcome: Not Progressing   Problem: Skin Integrity: Goal: Risk for impaired skin integrity will decrease Outcome: Not Progressing   Problem: Education: Goal: Ability to describe self-care measures that may prevent or decrease complications (Diabetes Survival Skills Education) will improve Outcome: Not Progressing Goal: Individualized Educational Video(s) Outcome: Not Progressing   Problem: Coping: Goal: Ability to adjust to condition or change in health will  improve Outcome: Not Progressing   Problem: Fluid Volume: Goal: Ability to maintain a balanced intake and output will improve Outcome: Not Progressing   Problem: Health Behavior/Discharge Planning: Goal: Ability to identify and utilize available resources and services will improve Outcome: Not Progressing Goal: Ability to manage health-related needs will improve Outcome: Not Progressing   Problem: Metabolic: Goal: Ability to maintain appropriate glucose levels will improve Outcome: Not Progressing   Problem: Nutritional: Goal: Maintenance of adequate nutrition will improve Outcome: Not Progressing Goal: Progress toward achieving an optimal weight will improve Outcome: Not Progressing   Problem: Skin Integrity: Goal: Risk for impaired skin integrity will decrease Outcome: Not Progressing   Problem: Tissue Perfusion: Goal: Adequacy of tissue perfusion will improve Outcome: Not Progressing

## 2023-05-05 NOTE — Progress Notes (Signed)
 1630 patient dangled to side of bed able to move legs in and out of bed able to hold own balance at side of bed for 15 mins. Tolerate well

## 2023-05-05 NOTE — Progress Notes (Signed)
 Pharmacy Antibiotic Note  Terrance Miller is a 89 y.o. male admitted on 05/04/2023 with sepsis.  Pharmacy has been consulted for Vancomcyin dosing.  Plan: Vancomycin  1500mg  IV loading dose, then 1250mg  IV Q 48 hrs. Goal AUC 400-550. Expected AUC: 479 SCr used: 2.02  Also on zosyn  3.375gm IV q8h EID over 4 hours F/u cxs and clinical progress Monitor V/S, labs and levels as indicated  Height: 5\' 6"  (167.6 cm) Weight: 82.5 kg (181 lb 14.1 oz) IBW/kg (Calculated) : 63.8  Temp (24hrs), Avg:96.9 F (36.1 C), Min:93.7 F (34.3 C), Max:98.3 F (36.8 C)  Recent Labs  Lab 05/04/23 0708 05/05/23 0201 05/05/23 0202 05/05/23 0744  WBC 7.0 16.3*  --   --   CREATININE 1.63* 2.02*  --   --   LATICACIDVEN  --   --  2.2* 1.7    Estimated Creatinine Clearance: 23.5 mL/min (A) (by C-G formula based on SCr of 2.02 mg/dL (H)).    Allergies  Allergen Reactions   Clobetasol  Other (See Comments)    Unknown  No reaction listed on MAR    Antimicrobials this admission: vancomycin  4/29 >> zosyn  4/28 >>   Microbiology results: 4/28 BCx: ngtd 4/28 MRSA PCR: +  Thank you for allowing pharmacy to be a part of this patient's care.  Terrance Miller, BS Pharm D, BCPS Clinical Pharmacist 05/05/2023 12:09 PM

## 2023-05-05 NOTE — Progress Notes (Signed)
 Subjective: No complaints for me this morning. Denies abdominal pain, nausea, or vomiting. A&O x 3 at least. Difficult to understand his report of why he at the hospital due to garbled speech at baseline.   Nurse reports multiple Bms yesterday after milk and molasses enema yesterday, passing several chunks of large hard stool along with multiple watery stools that were brown and yellow. Per overnight nursing, patient had several Bms overnight as well. Patient pulled his NG tube out overnight. Per documentation, looked like there was a small amount of coffee ground material in collection container. MD overnight spoke with daughter who stated she wasn't sure if she would want patient to undergo EGD.   This morning, nurse states abdomen is much softer today and he has had no recurrent vomiting.   He did experiece worsening hypotension overnight and received 1 dose of midodrine, additional IV fluids, and ultimately placed on levophed. Levophed has been stopped this morning and blood pressure is holding, but remains soft.     Objective: Vital signs in last 24 hours: Temp:  [93.7 F (34.3 C)-98.3 F (36.8 C)] 97.8 F (36.6 C) (04/29 0715) Pulse Rate:  [47-125] 97 (04/29 0845) Resp:  [11-25] 12 (04/29 0845) BP: (79-158)/(36-92) 93/51 (04/29 0845) SpO2:  [90 %-100 %] 92 % (04/29 0845) Weight:  [82.5 kg] 82.5 kg (04/28 1400) Last BM Date : 05/04/23 General:   Alert and oriented, resting with eyes closed, no acute distress.  Head:  Normocephalic and atraumatic. Eyes:  No icterus, sclera clear. Conjuctiva pink.  Abdomen:  Bowel sounds hypoactive, soft, non-tender, non-distended.  No rebound or guarding. No masses appreciated  Msk:  Symmetrical without gross deformities. Normal posture. Extremities:  With 1-2 + edema, greatest in LLE.  Neurologic:  Alert and  oriented x3. Skin:  Warm and dry, intact without significant lesions.   Intake/Output from previous day: 04/28 0701 - 04/29  0700 In: 4048.5 [I.V.:1879.4; IV Piggyback:2169.1] Out: 1100 [Urine:400; Emesis/NG output:700] Intake/Output this shift: Total I/O In: 384 [I.V.:361; IV Piggyback:23] Out: -   Lab Results: Recent Labs    05/04/23 0708 05/04/23 1522 05/05/23 0201  WBC 7.0  --  16.3*  HGB 11.3* 10.5* 9.6*  HCT 34.1* 32.4* 29.6*  PLT 146*  --  126*   BMET Recent Labs    05/04/23 0708 05/05/23 0201  NA 138 141  K 4.4 4.2  CL 106 110  CO2 22 21*  GLUCOSE 101* 80  BUN 39* 49*  CREATININE 1.63* 2.02*  CALCIUM  9.4 8.5*   LFT Recent Labs    05/04/23 0708 05/05/23 0201  PROT 8.1 6.7  ALBUMIN 3.7 3.1*  AST 41 31  ALT 29 21  ALKPHOS 85 65  BILITOT 1.7* 2.3*    Studies/Results: DG Abd 1 View Result Date: 05/05/2023 CLINICAL DATA:  Ileus. EXAM: ABDOMEN - 1 VIEW COMPARISON:  05/04/2023 FINDINGS: Temperature probe is now seen within the urinary bladder. Gaseous distention of the colon shows mild decrease since previous study, suggesting improving ileus. IMPRESSION: Mild improvement in colonic ileus. Electronically Signed   By: Marlyce Sine M.D.   On: 05/05/2023 08:27   DG Abd 1 View Result Date: 05/04/2023 CLINICAL DATA:  Nasogastric tube placement. EXAM: ABDOMEN - 1 VIEW COMPARISON:  CT earlier today FINDINGS: Tip and side port of the enteric tube is below the diaphragm in the stomach. The small-bowel distention on CT is not seen on the current exam. Mild gaseous distention of colon. Left colonic stool persists. Radiopaque  densities in the left abdomen air in the subcutaneous tissues on C2 IMPRESSION: Tip and side port of the enteric tube below the diaphragm in the stomach. Electronically Signed   By: Chadwick Colonel M.D.   On: 05/04/2023 16:47   CT ABDOMEN PELVIS WO CONTRAST Result Date: 05/04/2023 CLINICAL DATA:  Nausea and vomiting today. Bowel obstruction suspected. EXAM: CT ABDOMEN AND PELVIS WITHOUT CONTRAST TECHNIQUE: Multidetector CT imaging of the abdomen and pelvis was performed  following the standard protocol without IV contrast. RADIATION DOSE REDUCTION: This exam was performed according to the departmental dose-optimization program which includes automated exposure control, adjustment of the mA and/or kV according to patient size and/or use of iterative reconstruction technique. COMPARISON:  Abdominopelvic CT 01/27/2022. FINDINGS: Technical note: Despite efforts by the technologist and patient, mild motion artifact is present on today's exam and could not be eliminated. This reduces exam sensitivity and specificity. Lower chest: Mildly increased dependent opacities at both lung bases, most consistent with atelectasis. The distal esophagus is mildly dilated and fluid-filled. There is aortic and coronary artery atherosclerosis. Moderate bilateral gynecomastia noted. Hepatobiliary: No focal hepatic abnormalities are identified on noncontrast imaging. Dependent high density within the gallbladder lumen suspicious for small gallstones. No evidence of gallbladder wall thickening or significant biliary dilatation. Pancreas: Unremarkable. No pancreatic ductal dilatation or surrounding inflammatory changes. Spleen: Normal in size without focal abnormality. Adrenals/Urinary Tract: Both adrenal glands appear normal. No evidence of urinary tract calculus, suspicious renal lesion or hydronephrosis. Grossly stable renal cysts bilaterally for which no specific follow-up imaging is recommended. The urinary bladder is decompressed without focal abnormality. Stomach/Bowel: No enteric contrast administered. The stomach is moderately distended and fluid-filled. There are multiple mildly dilated loops of fluid-filled small bowel without focal transition point. The proximal colon is fluid-filled without significant distension. There is moderate stool throughout the distal colon. There is mild circumferential rectal wall thickening with perirectal soft tissue stranding. No other bowel wall thickening or  surrounding inflammation identified. Vascular/Lymphatic: There are no enlarged abdominal or pelvic lymph nodes. Aortic and branch vessel atherosclerosis without evidence of aneurysm. Reproductive: The prostate gland and seminal vesicles appear unremarkable. Other: Again demonstrated are bilateral inguinal hernias containing fat. The bladder and a small amount of fluid extend into the left inguinal hernia. There is no definite herniated bowel. No ascites, focal extraluminal fluid collection or pneumoperitoneum. Musculoskeletal: No acute or significant osseous findings. Multilevel spondylosis. IMPRESSION: 1. Mild circumferential rectal wall thickening with perirectal soft tissue stranding, suspicious for recurrent stercoral colitis. 2. The stomach and small bowel are fluid-filled and mildly dilated without focal transition point, likely secondary to constipation or ileus. 3. Bilateral inguinal hernias containing fat. The bladder and a small amount of fluid extend into the left inguinal hernia. No definite herniated bowel. 4. Probable cholelithiasis without evidence of cholecystitis. 5.  Aortic Atherosclerosis (ICD10-I70.0). Electronically Signed   By: Elmon Hagedorn M.D.   On: 05/04/2023 11:08    Assessment: 89 y.o. year old male  with history of HTN, HLD, CAD, CKD, MGUS, DM, vascular dementia, HF, MI in 2004, chronic constipation, stercoral colitis in Jan 2024 s/p flex sig during admission with large manual disimpaction of stool, now presenting with nausea and vomiting from [facility] and CT revealing  recurrent stercoral colitis, likely ileus in setting of constipation.   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 Patient pulled NG tube overnight, but has not had any recurrent vomiting and his abdomen is non-distended,  soft, non-tender with hypoactive bowel sounds. Abdominal x-ray with mild improvement in colonic ileus.   Sepsis presentation:  With hypothermia, hypotension, elevated WBC count, elevated lactic. Likely driven by stercoral colitis, but blood cultures have been drawn. Currently on Zosyn . Required Levophed overnight and started on midodrine. Levophed held this morning and pressure is holding, but soft. Will defer management to hospitalist.   Declining hemoglobin:  Hgb trending down since admission. 11.3>>10.5>>9.6. There was report of small amount of bright red blood per NG tube yesterday which was felt to be more likely related to trauma from insertion.  PPI was increased to twice daily.  Patient pulled NG tube overnight and provided noted minimal output in collection container that was consistent with coffee-ground material.  He has had no BRBPR or melena.  No recurrent emesis since NG tube was pulled.  It is possible that the small amount of coffee-ground material was related to prior bleeding in the setting of NG tube insertion/possible trauma.  I suspect his decline in hemoglobin is more likely related to hemodilution as he has received large amounts of IV fluids since admission due to hypotension.  MD overnight spoke with patient's daughter who did not think she would want him to undergo an EGD, but was open to discussing this if needed.  At this point, would recommend continuing PPI twice daily and monitoring for overt GI bleeding/persistently declining hemoglobin.  Regardless, he would need to be optimized from medical standpoint prior to EGD if needed.    Plan: Trial clear liquid diet today.  Start MiraLAX  17 g TID If recurent N/V, will need to make NPO and consider reinsertion of NG tube.  Monitor for overt GI bleeding.  Monitor H/H and transfuse as necessary.  Continue IV PPI BID.    LOS: 1 day    05/05/2023, 9:23 AM   Shana Daring, PA-C [facility]

## 2023-05-05 NOTE — Significant Event (Addendum)
 Significant event note:   Notified by RN of worsening hypotension 80/50s. Gave 500 cc IVF and started on Midodrine 10 mg TID but remains hypotensive. Has gotten large volume fluid resuscitation with 2.5 L and on rate 125 cc / hr. Prior to the 500 cc. Since not fluid responsive will start on Norepinephrine gtt peripherally.   Plan:  - NE gtt, will need assessment for central access in AM  - Continue Midodrine 10 mg TID for vasopressor sparing  - Check Lactate, Hb with recent downtrend / GI bleeding, type and screen  - Upgraded to ICU level of care   Update:  Later pulled out his NGT. Appears SB dilation had resolved on the XR 4/28 and he had minimal output (although did appear consistent with coffee ground material / old blood). Will hold on replacing NGT for now. Serial XR this AM. And will cover for GIB with PPI IV BID + stopped his heparin , placed on SCD.   Called and updated patients daughter Terrance Miller this morning re: worsening BP, initiation of vasopressor medicine as a form of life support, GI bleeding, and escalation of care to ICU. She is understanding, hopeful that some of his illness is reversible. Discussed GI involvement re: GI bleeding, she does not think she would want him to undergo an EGD but open to discussing with team.   Terrance Larch, MD  [facility]

## 2023-05-05 NOTE — TOC Initial Note (Addendum)
 Transition of Care [facility]) - Initial/Assessment Note    Patient Details  Name: Terrance Miller MRN: [id number] Date of Birth: 05-25-1928  [facility] CM/SW Contact:    Orelia Binet, RN Phone Number: 05/05/2023, 1:07 PM  Clinical Narrative:         Patient admitted with stercoral colitis. Patient admitted two weeks ago, assesses and discharged to [facility] for SNF. Patient will return to [facility] when medically stable. MD aware we will need PT eval and auth for patient to return. DC planing for 1-2 days. TOC following.           Expected Discharge Plan: Skilled Nursing Facility Barriers to Discharge: Continued Medical Work up   Patient Goals and CMS Choice Patient states their goals for this hospitalization and ongoing recovery are:: SNF CMS Medicare.gov Compare Post Acute Care list provided to:: Patient Choice offered to / list presented to : Adult Children [place] ownership interest in [facility].provided to:: Patient    Expected Discharge Plan and Services     Post Acute Care Choice: Durable Medical Equipment Living arrangements for the past 2 months: Single Family Home             Prior Living Arrangements/Services Living arrangements for the past 2 months: Single Family Home Lives with:: Adult Children, Relatives    Activities of Daily Living   ADL Screening (condition at time of admission) Independently performs ADLs?: No Does the patient have a NEW difficulty with bathing/dressing/toileting/self-feeding that is expected to last >3 days?: No Does the patient have a NEW difficulty with getting in/out of bed, walking, or climbing stairs that is expected to last >3 days?: No Does the patient have a NEW difficulty with communication that is expected to last >3 days?: No Is the patient deaf or have difficulty hearing?: Yes Does the patient have difficulty seeing, even when wearing glasses/contacts?: No Does the patient have difficulty  concentrating, remembering, or making decisions?: Yes  Permission Sought/Granted     Admission diagnosis:  Bilious vomiting with nausea [R11.14] Stercoral colitis [K52.89] Patient Active Problem List   Diagnosis Date Noted   Stercoral colitis 05/04/2023   (HFpEF) heart failure with preserved ejection fraction (HCC) 05/04/2023   Nausea with vomiting 05/04/2023   DNR (do not resuscitate) 05/04/2023   FTT (failure to thrive) in adult 05/04/2023   UTI (urinary tract infection) 04/21/2023   Thrombocytopenia (HCC) 04/21/2023   Acute on chronic diastolic CHF (congestive heart failure) (HCC) 04/21/2023   Obesity, Class III, BMI 40-49.9 (morbid obesity) (HCC) 04/21/2023   Injury to scrotum, penis, or foreskin 03/18/2023   Type 2 diabetes mellitus with chronic kidney disease, with long-term current use of insulin (HCC) 12/20/2022   Glaucoma 12/20/2022   Vascular dementia without behavioral disturbance (HCC) 08/15/2022   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   Aortic atherosclerosis (HCC) 08/05/2022   Chronic constipation 08/05/2022   Increased intraocular pressure, bilateral 08/05/2022   Edema, peripheral 08/05/2022   Aortic regurgitation 03/17/2022   Pulmonary HTN (HCC) 03/17/2022   Dry skin dermatitis 02/05/2022   Stage 3b chronic kidney disease (HCC) 11/05/2021   MGUS (monoclonal gammopathy of unknown significance) 11/05/2021   Acute metabolic encephalopathy 07/27/2021   Macrocytic anemia 05/31/2021   History of MI (myocardial infarction) 04/29/2021   Hypothermia 05/20/2017   Severe sepsis with septic shock (HCC) 05/20/2017   Essential hypertension 09/28/2013   Hyperlipidemia LDL goal <70 09/28/2013   CAD (coronary artery disease) 09/28/2013   PCP:  System, Provider Not In Pharmacy:   [facility] - [place], [place] - [address] [place] [address] Phone: [phone] Fax: [phone]  [facility] Mail  Delivery - [place], [place] - [address] [place] [address] Phone: [phone] Fax: [phone]     Social Drivers of Health (SDOH) Social History: SDOH Screenings   Food Insecurity: No Food Insecurity (05/04/2023)  Housing: Low Risk  (05/04/2023)  Transportation Needs: No Transportation Needs (05/04/2023)  Utilities: Not At Risk (05/04/2023)  Alcohol Screen: Low Risk  (01/05/2023)  Depression (PHQ2-9): Medium Risk (03/17/2023)  Financial Resource Strain: Low Risk  (01/05/2023)  Physical Activity: Inactive (01/05/2023)  Social Connections: Socially Isolated (05/04/2023)  Stress: No Stress Concern Present (01/05/2023)  Tobacco Use: Medium Risk (04/22/2023)  Health Literacy: Adequate Health Literacy (01/05/2023)   SDOH Interventions:     Readmission Risk Interventions    04/24/2023    1:31 PM 04/23/2023   11:23 AM 04/22/2023   10:47 AM  Readmission Risk Prevention Plan  Transportation Screening Complete Complete Complete  HRI or Home Care Consult Complete Complete Complete  Social Work Consult for Recovery Care Planning/Counseling Complete Complete Complete  Palliative Care Screening Complete Complete Complete  Medication Review Oceanographer) Complete Complete Complete

## 2023-05-05 NOTE — Consult Note (Addendum)
 [facility] Consult  Reason for Consult: Stercoral ulcer , ileus  Referring Physician:  Dr. Lincoln Renshaw   Chief Complaint   Nausea; Emesis     HPI: Tupac Sunde is a 89 y.o. 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 CT showed stercoral colitis and ileus in the setting of constipation. I was consulted by the ED. Since that time he has been having Bms and tolerating some clears af ter he pulled his NG tube.   He is able to answer questions but difficult to understand due to garbled words.   Past Medical History:  Diagnosis Date   Acute ST elevation myocardial infarction (STEMI) of inferior wall (HCC) 2004   Coronary artery disease    DES x2 to the RCA 2004, residual disease managed medically   Hyperlipidemia    Hypertension    Type 2 diabetes mellitus (HCC)     Past Surgical History:  Procedure Laterality Date   BIOPSY  01/28/2022   Procedure: BIOPSY;  Surgeon: Umberto Ganong, Bearl Limes, MD;  Location: [facility];  Service: Gastroenterology;;   CATARACT EXTRACTION Bilateral    CORONARY ANGIOPLASTY WITH STENT PLACEMENT  09/05/2002   stent RCA, 80% first diagonal, 70-80% mid-diagonal, 80% mid LAD stenosis   FLEXIBLE SIGMOIDOSCOPY N/A 01/28/2022   Procedure: FLEXIBLE SIGMOIDOSCOPY;  Surgeon: Urban Garden, MD;  Location: [facility];  Service: Gastroenterology;  Laterality: N/A;   HYDROCELE EXCISION Bilateral 11/20/2021   Procedure: HYDROCELECTOMY ADULT;  Surgeon: Mellie Sprinkle., MD;  Location: [facility];  Service: Urology;  Laterality: Bilateral;   IMPACTION REMOVAL  01/28/2022   Procedure: IMPACTION REMOVAL;  Surgeon: Urban Garden, MD;  Location: [facility];  Service: Gastroenterology;;   NM MYOCAR PERF WALL MOTION  11/01/2008   Normal    Family History  Problem Relation Age of Onset   Hypertension Father     Diabetes Sister     Social History   Tobacco Use   Smoking status: Former    Current packs/day: 0.00    Types: Cigarettes    Quit date: 11/18/2005    Years since quitting: 17.4    Passive exposure: Past   Smokeless tobacco: Never  Vaping Use   Vaping status: Never Used  Substance Use Topics   Alcohol use: Not Currently   Drug use: No    Medications: I have reviewed the patient's current medications. Prior to Admission:  Medications Prior to Admission  Medication Sig Dispense Refill Last Dose/Taking   acetaminophen  (TYLENOL ) 325 MG tablet Take 2 tablets (650 mg total) by mouth every 6 (six) hours as needed for mild pain (pain score 1-3) (or Fever >/= 101). (Patient taking differently: Take 650 mg by mouth every 6 (six) hours as needed for mild pain (pain score 1-3) (or Fever >/= 101). Do not exceed 3 g in 24 hours)   Unknown   albuterol  (PROVENTIL ) (2.5 MG/3ML) 0.083% nebulizer solution Take 2.5 mg by nebulization every 6 (six) hours as needed for wheezing or shortness of breath.   Unknown   amLODipine  (NORVASC ) 10 MG tablet Take 1 tablet (10 mg total) by mouth daily. 30 tablet 11 05/03/2023 Morning   aspirin  EC 81 MG tablet Take 1 tablet (81 mg total) by mouth daily with breakfast. Swallow whole. 90 tablet 3 05/03/2023 at  8:00 AM   bisacodyl  (DULCOLAX) 10 MG suppository Place 1 suppository (10 mg total) rectally every  Monday, Wednesday, and Friday. (Patient taking differently: Place 10 mg rectally 3 (three) times a week. Monday, Wednesday, and Friday) 15 suppository 3 05/01/2023   carvedilol  (COREG ) 3.125 MG tablet Take 1 tablet (3.125 mg total) by mouth 2 (two) times daily. 60 tablet 5 05/03/2023 Bedtime   cholecalciferol  (VITAMIN D ) 1000 units tablet Take 1,000 Units by mouth daily.   05/03/2023 Morning   cyanocobalamin  (VITAMIN B12) 1000 MCG tablet Take 1,000 mcg by mouth daily.   05/03/2023 Morning   folic acid  (FOLVITE ) 1 MG tablet Take 1 tablet (1 mg total) by mouth daily. 90 tablet  3 05/03/2023 Morning   furosemide  (LASIX ) 20 MG tablet Take 1 tablet (20 mg total) by mouth daily. 30 tablet 3 05/03/2023 Morning   latanoprost  (XALATAN ) 0.005 % ophthalmic solution Place 1 drop into both eyes 2 (two) times daily. 2.5 mL 0 05/03/2023 Bedtime   melatonin 3 MG TABS tablet Take 3 mg by mouth at bedtime.   Unknown   memantine (NAMENDA) 5 MG tablet Take 5 mg by mouth daily.   Unknown   polyethylene glycol (MIRALAX  / GLYCOLAX ) 17 g packet Take 17 g by mouth 2 (two) times daily. 60 each 3 05/03/2023 Bedtime   rosuvastatin  (CRESTOR ) 5 MG tablet Take 5 mg by mouth daily.   05/03/2023 Morning   senna (SENOKOT) 8.6 MG TABS tablet Take 2 tablets (17.2 mg total) by mouth at bedtime. 60 tablet 2 05/03/2023 Bedtime   tamsulosin  (FLOMAX ) 0.4 MG CAPS capsule Take 1 capsule (0.4 mg total) by mouth daily. 30 capsule 0 05/03/2023 Morning   [EXPIRED] ciprofloxacin  (CIPRO ) 250 MG tablet Take 250 mg by mouth 2 (two) times daily.   05/01/2023   [EXPIRED] tuberculin 5 UNIT/0.1ML injection Inject 5 Units into the skin once.   04/28/2023   Scheduled:  [START ON 05/06/2023] aspirin  EC  81 mg Oral Q breakfast   Chlorhexidine  Gluconate Cloth  6 each Topical Daily   enoxaparin  (LOVENOX ) injection  30 mg Subcutaneous Q24H   folic acid   1 mg Oral Daily   latanoprost   1 drop Both Eyes QHS   melatonin  3 mg Oral QHS   memantine  5 mg Oral Daily   midodrine  10 mg Oral TID WC   mupirocin  ointment  1 Application Nasal BID   pantoprazole (PROTONIX) IV  40 mg Intravenous Q12H   polyethylene glycol  17 g Oral TID   rosuvastatin   5 mg Oral Daily   tamsulosin   0.4 mg Oral QPC supper   Continuous:  sodium chloride  Stopped (05/05/23 1442)   lactated ringers  125 mL/hr at 05/05/23 1700   norepinephrine (LEVOPHED) Adult infusion Stopped (05/05/23 0831)   piperacillin -tazobactam (ZOSYN )  IV 12.5 mL/hr at 05/05/23 1700   [START ON 05/07/2023] vancomycin      PRN:acetaminophen  **OR** acetaminophen , fentaNYL  (SUBLIMAZE )  injection, ondansetron  **OR** ondansetron  (ZOFRAN ) IV, prochlorperazine  Allergies  Allergen Reactions   Clobetasol  Other (See Comments)    Unknown  No reaction listed on MAR     ROS:  Review of systems not obtained due to patient factors.  Blood pressure (!) 117/50, pulse 61, temperature 98.4 F (36.9 C), temperature source Rectal, resp. rate 17, height 5\' 6"  (1.676 m), weight 82.5 kg, SpO2 98%. Physical Exam Vitals reviewed.  HENT:     Head: Normocephalic.     Nose: Nose normal.  Cardiovascular:     Rate and Rhythm: Normal rate.  Pulmonary:     Effort: Pulmonary effort is normal.  Abdominal:  General: There is no distension.     Palpations: Abdomen is soft.     Tenderness: There is no abdominal tenderness.  Skin:    General: Skin is warm.  Neurological:     Mental Status: He is alert. Mental status is at baseline.     Results: Results for orders placed or performed during the hospital encounter of 05/04/23 (from the past 48 hours)  Hemoglobin A1c     Status: Abnormal   Collection Time: 05/04/23  7:00 AM  Result Value Ref Range   Hgb A1c MFr Bld 4.4 (L) 4.8 - 5.6 %    Comment: (NOTE) Pre diabetes:          5.7%-6.4%  Diabetes:              >6.4%  Glycemic control for   <7.0% adults with diabetes    Mean Plasma Glucose 79.58 mg/dL    Comment: Performed at [facility], [address], [place], [place] [address]  Comprehensive metabolic panel     Status: Abnormal   Collection Time: 05/04/23  7:08 AM  Result Value Ref Range   Sodium 138 135 - 145 mmol/L   Potassium 4.4 3.5 - 5.1 mmol/L   Chloride 106 98 - 111 mmol/L   CO2 22 22 - 32 mmol/L   Glucose, Bld 101 (H) 70 - 99 mg/dL    Comment: Glucose reference range applies only to samples taken after fasting for at least 8 hours.   BUN 39 (H) 8 - 23 mg/dL   Creatinine, Ser 2.53 (H) 0.61 - 1.24 mg/dL   Calcium  9.4 8.9 - 10.3 mg/dL   Total Protein 8.1 6.5 - 8.1 g/dL   Albumin 3.7 3.5 - 5.0 g/dL   AST  41 15 - 41 U/L   ALT 29 0 - 44 U/L   Alkaline Phosphatase 85 38 - 126 U/L   Total Bilirubin 1.7 (H) 0.0 - 1.2 mg/dL   GFR, Estimated 39 (L) >60 mL/min    Comment: (NOTE) Calculated using the CKD-EPI Creatinine Equation (2021)    Anion gap 10 5 - 15    Comment: Performed at [facility], [address] [place], [place] [address]  Lipase, blood     Status: None   Collection Time: 05/04/23  7:08 AM  Result Value Ref Range   Lipase 31 11 - 51 U/L    Comment: Performed at [facility], [address], [place], [place] [address]  CBC with Differential     Status: Abnormal   Collection Time: 05/04/23  7:08 AM  Result Value Ref Range   WBC 7.0 4.0 - 10.5 K/uL   RBC 3.23 (L) 4.22 - 5.81 MIL/uL   Hemoglobin 11.3 (L) 13.0 - 17.0 g/dL   HCT 59.5 (L) 63.8 - 75.6 %   MCV 105.6 (H) 80.0 - 100.0 fL   MCH 35.0 (H) 26.0 - 34.0 pg   MCHC 33.1 30.0 - 36.0 g/dL   RDW 43.3 (H) 29.5 - 18.8 %   Platelets 146 (L) 150 - 400 K/uL    Comment: REPEATED TO VERIFY   nRBC 0.4 (H) 0.0 - 0.2 %   Neutrophils Relative % 81 %   Neutro Abs 5.6 1.7 - 7.7 K/uL   Lymphocytes Relative 13 %   Lymphs Abs 0.9 0.7 - 4.0 K/uL   Monocytes Relative 6 %   Monocytes Absolute 0.5 0.1 - 1.0 K/uL   Eosinophils Relative 0 %   Eosinophils Absolute 0.0  0.0 - 0.5 K/uL   Basophils Relative 0 %   Basophils Absolute 0.0 0.0 - 0.1 K/uL   WBC Morphology MORPHOLOGY UNREMARKABLE    RBC Morphology See Note    Smear Review MORPHOLOGY UNREMARKABLE    Immature Granulocytes 0 %   Abs Immature Granulocytes 0.02 0.00 - 0.07 K/uL   Acanthocytes PRESENT    Pappenheimer Bodies PRESENT    Polychromasia PRESENT    Basophilic Stippling PRESENT    Target Cells PRESENT     Comment: Performed at [facility], [address], [place], [place] [address]  Urinalysis, w/ Reflex to Culture (Infection Suspected) -Urine, Clean Catch     Status: Abnormal   Collection Time: 05/04/23  9:20 AM  Result Value Ref Range   Specimen Source URINE, CLEAN CATCH     Color, Urine YELLOW YELLOW   APPearance HAZY (A) CLEAR   Specific Gravity, Urine 1.018 1.005 - 1.030   pH 5.0 5.0 - 8.0   Glucose, UA NEGATIVE NEGATIVE mg/dL   Hgb urine dipstick NEGATIVE NEGATIVE   Bilirubin Urine NEGATIVE NEGATIVE   Ketones, ur NEGATIVE NEGATIVE mg/dL   Protein, ur 30 (A) NEGATIVE mg/dL   Nitrite NEGATIVE NEGATIVE   Leukocytes,Ua NEGATIVE NEGATIVE   RBC / HPF 0-5 0 - 5 RBC/hpf   WBC, UA 0-5 0 - 5 WBC/hpf    Comment:        Reflex urine culture not performed if WBC <=10, OR if Squamous epithelial cells >5. If Squamous epithelial cells >5 suggest recollection.    Bacteria, UA RARE (A) NONE SEEN   Squamous Epithelial / HPF 0-5 0 - 5 /HPF   Mucus PRESENT    Hyaline Casts, UA PRESENT     Comment: Performed at [facility], [address] [place], [place] [address]  Culture, blood (Routine X 2) w Reflex to ID Panel     Status: None (Preliminary result)   Collection Time: 05/04/23  1:09 PM   Specimen: BLOOD  Result Value Ref Range   Specimen Description BLOOD BLOOD LEFT HAND    Special Requests NONE    Culture      NO GROWTH < 24 HOURS Performed at [facility], [address], [place], [place] [address]    Report Status PENDING   Culture, blood (Routine X 2) w Reflex to ID Panel     Status: None (Preliminary result)   Collection Time: 05/04/23  1:25 PM   Specimen: BLOOD  Result Value Ref Range   Specimen Description BLOOD BLOOD RIGHT HAND    Special Requests      BOTTLES DRAWN AEROBIC AND ANAEROBIC Blood Culture results may not be optimal due to an inadequate volume of blood received in culture bottles   Culture      NO GROWTH < 24 HOURS Performed at [facility], [address], [place], [place] [address]    Report Status PENDING   MRSA Next Gen by PCR, Nasal     Status: Abnormal   Collection Time: 05/04/23  1:36 PM   Specimen: Nasal Mucosa; Nasal Swab  Result Value Ref Range   MRSA by PCR Next Gen DETECTED (A) NOT DETECTED    Comment: RESULT CALLED  TO, READ BACK BY AND VERIFIED WITH: L IRVING AT 1925 ON [id number] BY S DALTON (NOTE) The GeneXpert MRSA Assay (FDA approved for NASAL specimens only), is one component of a comprehensive MRSA colonization surveillance program. It is not intended to diagnose MRSA infection nor to guide or monitor  treatment for MRSA infections. Test performance is not FDA approved in patients less than 21 years old. Performed at [facility], [address], [place], [place] [address]   Glucose, capillary     Status: None   Collection Time: 05/04/23  1:36 PM  Result Value Ref Range   Glucose-Capillary 94 70 - 99 mg/dL    Comment: Glucose reference range applies only to samples taken after fasting for at least 8 hours.  Hemoglobin and hematocrit, blood     Status: Abnormal   Collection Time: 05/04/23  3:22 PM  Result Value Ref Range   Hemoglobin 10.5 (L) 13.0 - 17.0 g/dL   HCT 60.4 (L) 54.0 - 98.1 %    Comment: Performed at [facility], [address] [place], [place] [address]  Glucose, capillary     Status: None   Collection Time: 05/04/23  6:44 PM  Result Value Ref Range   Glucose-Capillary 80 70 - 99 mg/dL    Comment: Glucose reference range applies only to samples taken after fasting for at least 8 hours.  Glucose, capillary     Status: None   Collection Time: 05/05/23 12:04 AM  Result Value Ref Range   Glucose-Capillary 74 70 - 99 mg/dL    Comment: Glucose reference range applies only to samples taken after fasting for at least 8 hours.  Comprehensive metabolic panel     Status: Abnormal   Collection Time: 05/05/23  2:01 AM  Result Value Ref Range   Sodium 141 135 - 145 mmol/L   Potassium 4.2 3.5 - 5.1 mmol/L   Chloride 110 98 - 111 mmol/L   CO2 21 (L) 22 - 32 mmol/L   Glucose, Bld 80 70 - 99 mg/dL    Comment: Glucose reference range applies only to samples taken after fasting for at least 8 hours.   BUN 49 (H) 8 - 23 mg/dL   Creatinine, Ser 8.29 (H) 0.61 - 1.24 mg/dL   Calcium  8.5 (L) 8.9  - 10.3 mg/dL   Total Protein 6.7 6.5 - 8.1 g/dL   Albumin 3.1 (L) 3.5 - 5.0 g/dL   AST 31 15 - 41 U/L   ALT 21 0 - 44 U/L   Alkaline Phosphatase 65 38 - 126 U/L   Total Bilirubin 2.3 (H) 0.0 - 1.2 mg/dL   GFR, Estimated 30 (L) >60 mL/min    Comment: (NOTE) Calculated using the CKD-EPI Creatinine Equation (2021)    Anion gap 10 5 - 15    Comment: Performed at [facility], [address], [place], [place] [address]  Magnesium     Status: None   Collection Time: 05/05/23  2:01 AM  Result Value Ref Range   Magnesium 2.0 1.7 - 2.4 mg/dL    Comment: Performed at [facility], [address], [place], [place] [address]  CBC with Differential/Platelet     Status: Abnormal   Collection Time: 05/05/23  2:01 AM  Result Value Ref Range   WBC 16.3 (H) 4.0 - 10.5 K/uL   RBC 2.75 (L) 4.22 - 5.81 MIL/uL   Hemoglobin 9.6 (L) 13.0 - 17.0 g/dL   HCT 84.6 (L) 96.2 - 95.2 %   MCV 107.6 (H) 80.0 - 100.0 fL   MCH 34.9 (H) 26.0 - 34.0 pg   MCHC 32.4 30.0 - 36.0 g/dL   RDW 84.1 (H) 32.4 - 40.1 %   Platelets 126 (L) 150 - 400 K/uL   nRBC 0.3 (H) 0.0 - 0.2 %  Neutrophils Relative % 89 %   Neutro Abs 14.5 (H) 1.7 - 7.7 K/uL   Lymphocytes Relative 4 %   Lymphs Abs 0.6 (L) 0.7 - 4.0 K/uL   Monocytes Relative 7 %   Monocytes Absolute 1.1 (H) 0.1 - 1.0 K/uL   Eosinophils Relative 0 %   Eosinophils Absolute 0.0 0.0 - 0.5 K/uL   Basophils Relative 0 %   Basophils Absolute 0.0 0.0 - 0.1 K/uL   WBC Morphology MORPHOLOGY UNREMARKABLE    RBC Morphology See Note     Comment: ANISOCYTOSIS   Smear Review PLATELETS APPEAR DECREASED     Comment: PLATELET COUNT CONFIRMED BY SMEAR   Immature Granulocytes 0 %   Abs Immature Granulocytes 0.05 0.00 - 0.07 K/uL   Acanthocytes PRESENT    Polychromasia PRESENT    Target Cells PRESENT    Ovalocytes PRESENT     Comment: Performed at [facility], [address], [place], [place] [address]  Lactic acid, plasma     Status: Abnormal   Collection Time: 05/05/23  2:02 AM   Result Value Ref Range   Lactic Acid, Venous 2.2 (HH) 0.5 - 1.9 mmol/L    Comment: CRITICAL RESULT CALLED TO, READ BACK BY AND VERIFIED WITH Carmencita Chou [id number], Eula Hey Performed at [facility], [address] [place], [place] [address]   Type and screen [facility]     Status: None   Collection Time: 05/05/23  2:02 AM  Result Value Ref Range   ABO/RH(D) A POS    Antibody Screen NEG    Sample Expiration      05/08/2023,2359 Performed at [facility], [address] [place], [place] [address]   Glucose, capillary     Status: Abnormal   Collection Time: 05/05/23  6:05 AM  Result Value Ref Range   Glucose-Capillary 103 (H) 70 - 99 mg/dL    Comment: Glucose reference range applies only to samples taken after fasting for at least 8 hours.  Lactic acid, plasma     Status: None   Collection Time: 05/05/23  7:44 AM  Result Value Ref Range   Lactic Acid, Venous 1.7 0.5 - 1.9 mmol/L    Comment: Performed at [facility], [address] [place], [place] [address]  Glucose, capillary     Status: None   Collection Time: 05/05/23 11:36 AM  Result Value Ref Range   Glucose-Capillary 85 70 - 99 mg/dL    Comment: Glucose reference range applies only to samples taken after fasting for at least 8 hours.   Comment 1 Notify RN    Comment 2 Document in Chart    Personally reviewed- CT with stercoral colitis and dilated bowel, concern for ileus, no free air, no signs of perforation  DG Abd 1 View Result Date: 05/05/2023 CLINICAL DATA:  Ileus. EXAM: ABDOMEN - 1 VIEW COMPARISON:  05/04/2023 FINDINGS: Temperature probe is now seen within the urinary bladder. Gaseous distention of the colon shows mild decrease since previous study, suggesting improving ileus. IMPRESSION: Mild improvement in colonic ileus. Electronically Signed   By: Marlyce Sine M.D.   On: 05/05/2023 08:27   DG Abd 1 View Result Date: 05/04/2023 CLINICAL DATA:  Nasogastric tube placement. EXAM: ABDOMEN - 1 VIEW COMPARISON:   CT earlier today FINDINGS: Tip and side port of the enteric tube is below the diaphragm in the stomach. The small-bowel distention on CT is not seen on the current exam. Mild gaseous distention of colon. Left colonic stool persists.  Radiopaque densities in the left abdomen air in the subcutaneous tissues on C2 IMPRESSION: Tip and side port of the enteric tube below the diaphragm in the stomach. Electronically Signed   By: Chadwick Colonel M.D.   On: 05/04/2023 16:47   CT ABDOMEN PELVIS WO CONTRAST Result Date: 05/04/2023 CLINICAL DATA:  Nausea and vomiting today. Bowel obstruction suspected. EXAM: CT ABDOMEN AND PELVIS WITHOUT CONTRAST TECHNIQUE: Multidetector CT imaging of the abdomen and pelvis was performed following the standard protocol without IV contrast. RADIATION DOSE REDUCTION: This exam was performed according to the departmental dose-optimization program which includes automated exposure control, adjustment of the mA and/or kV according to patient size and/or use of iterative reconstruction technique. COMPARISON:  Abdominopelvic CT 01/27/2022. FINDINGS: Technical note: Despite efforts by the technologist and patient, mild motion artifact is present on today's exam and could not be eliminated. This reduces exam sensitivity and specificity. Lower chest: Mildly increased dependent opacities at both lung bases, most consistent with atelectasis. The distal esophagus is mildly dilated and fluid-filled. There is aortic and coronary artery atherosclerosis. Moderate bilateral gynecomastia noted. Hepatobiliary: No focal hepatic abnormalities are identified on noncontrast imaging. Dependent high density within the gallbladder lumen suspicious for small gallstones. No evidence of gallbladder wall thickening or significant biliary dilatation. Pancreas: Unremarkable. No pancreatic ductal dilatation or surrounding inflammatory changes. Spleen: Normal in size without focal abnormality. Adrenals/Urinary Tract: Both  adrenal glands appear normal. No evidence of urinary tract calculus, suspicious renal lesion or hydronephrosis. Grossly stable renal cysts bilaterally for which no specific follow-up imaging is recommended. The urinary bladder is decompressed without focal abnormality. Stomach/Bowel: No enteric contrast administered. The stomach is moderately distended and fluid-filled. There are multiple mildly dilated loops of fluid-filled small bowel without focal transition point. The proximal colon is fluid-filled without significant distension. There is moderate stool throughout the distal colon. There is mild circumferential rectal wall thickening with perirectal soft tissue stranding. No other bowel wall thickening or surrounding inflammation identified. Vascular/Lymphatic: There are no enlarged abdominal or pelvic lymph nodes. Aortic and branch vessel atherosclerosis without evidence of aneurysm. Reproductive: The prostate gland and seminal vesicles appear unremarkable. Other: Again demonstrated are bilateral inguinal hernias containing fat. The bladder and a small amount of fluid extend into the left inguinal hernia. There is no definite herniated bowel. No ascites, focal extraluminal fluid collection or pneumoperitoneum. Musculoskeletal: No acute or significant osseous findings. Multilevel spondylosis. IMPRESSION: 1. Mild circumferential rectal wall thickening with perirectal soft tissue stranding, suspicious for recurrent stercoral colitis. 2. The stomach and small bowel are fluid-filled and mildly dilated without focal transition point, likely secondary to constipation or ileus. 3. Bilateral inguinal hernias containing fat. The bladder and a small amount of fluid extend into the left inguinal hernia. No definite herniated bowel. 4. Probable cholelithiasis without evidence of cholecystitis. 5.  Aortic Atherosclerosis (ICD10-I70.0). Electronically Signed   By: Elmon Hagedorn M.D.   On: 05/04/2023 11:08     Assessment  & Plan:  Terrance Miller is a 89 y.o. male with stercoral colitis. He needs to be on a bowel regimen to manage the constipation. No surgical intervention indicated at this time. He says that if he had to get emergency surgery to survive then he would want surgery. No family in the room to confirm this but patient has been oriented X 4 with the Rns. If needed surgery would get a permanent colostomy.  -Bowel regimen -Adv diet as tolerated   Updated team.    Awilda Bogus 05/05/2023,  5:27 PM

## 2023-05-06 DIAGNOSIS — A419 Sepsis, unspecified organism: Secondary | ICD-10-CM | POA: Diagnosis not present

## 2023-05-06 DIAGNOSIS — K5909 Other constipation: Secondary | ICD-10-CM | POA: Diagnosis not present

## 2023-05-06 DIAGNOSIS — K5289 Other specified noninfective gastroenteritis and colitis: Secondary | ICD-10-CM | POA: Diagnosis not present

## 2023-05-06 DIAGNOSIS — R627 Adult failure to thrive: Secondary | ICD-10-CM

## 2023-05-06 DIAGNOSIS — E785 Hyperlipidemia, unspecified: Secondary | ICD-10-CM | POA: Diagnosis not present

## 2023-05-06 LAB — BLOOD CULTURE ID PANEL (REFLEXED) - BCID2

## 2023-05-06 LAB — COMPREHENSIVE METABOLIC PANEL WITH GFR
ALT: 18 U/L (ref 0–44)
AST: 27 U/L (ref 15–41)
Albumin: 2.8 g/dL — ABNORMAL LOW (ref 3.5–5.0)
Alkaline Phosphatase: 60 U/L (ref 38–126)
Anion gap: 7 (ref 5–15)
BUN: 43 mg/dL — ABNORMAL HIGH (ref 8–23)
CO2: 21 mmol/L — ABNORMAL LOW (ref 22–32)
Calcium: 8.5 mg/dL — ABNORMAL LOW (ref 8.9–10.3)
Chloride: 110 mmol/L (ref 98–111)
Creatinine, Ser: 2.24 mg/dL — ABNORMAL HIGH (ref 0.61–1.24)
GFR, Estimated: 27 mL/min — ABNORMAL LOW (ref >60)
Glucose, Bld: 75 mg/dL (ref 70–99)
Potassium: 3.8 mmol/L (ref 3.5–5.1)
Sodium: 138 mmol/L (ref 135–145)
Total Bilirubin: 2.4 mg/dL — ABNORMAL HIGH (ref 0.0–1.2)
Total Protein: 6.2 g/dL — ABNORMAL LOW (ref 6.5–8.1)

## 2023-05-06 LAB — IRON AND TIBC
Iron: 79 ug/dL (ref 45–182)
Saturation Ratios: 35 % (ref 17.9–39.5)
TIBC: 226 ug/dL — ABNORMAL LOW (ref 250–450)
UIBC: 147 ug/dL

## 2023-05-06 LAB — CBC WITH DIFFERENTIAL/PLATELET
Abs Immature Granulocytes: 0.02 10*3/uL (ref 0.00–0.07)
Basophils Absolute: 0 10*3/uL (ref 0.0–0.1)
Basophils Relative: 1 %
Eosinophils Absolute: 0.2 10*3/uL (ref 0.0–0.5)
Eosinophils Relative: 3 %
HCT: 24.9 % — ABNORMAL LOW (ref 39.0–52.0)
Hemoglobin: 8.1 g/dL — ABNORMAL LOW (ref 13.0–17.0)
Immature Granulocytes: 0 %
Lymphocytes Relative: 16 %
Lymphs Abs: 1 10*3/uL (ref 0.7–4.0)
MCH: 34.8 pg — ABNORMAL HIGH (ref 26.0–34.0)
MCHC: 32.5 g/dL (ref 30.0–36.0)
MCV: 106.9 fL — ABNORMAL HIGH (ref 80.0–100.0)
Monocytes Absolute: 0.8 10*3/uL (ref 0.1–1.0)
Monocytes Relative: 13 %
Neutro Abs: 4.4 10*3/uL (ref 1.7–7.7)
Neutrophils Relative %: 67 %
Platelets: 99 10*3/uL — ABNORMAL LOW (ref 150–400)
RBC: 2.33 MIL/uL — ABNORMAL LOW (ref 4.22–5.81)
RDW: 25.6 % — ABNORMAL HIGH (ref 11.5–15.5)
Smear Review: DECREASED
WBC: 6.5 10*3/uL (ref 4.0–10.5)
nRBC: 0.6 % — ABNORMAL HIGH (ref 0.0–0.2)

## 2023-05-06 LAB — VITAMIN B12: Vitamin B-12: 1249 pg/mL — ABNORMAL HIGH (ref 180–914)

## 2023-05-06 LAB — GLUCOSE, CAPILLARY
Glucose-Capillary: 72 mg/dL (ref 70–99)
Glucose-Capillary: 74 mg/dL (ref 70–99)

## 2023-05-06 LAB — RETICULOCYTES
Immature Retic Fract: 13.4 % (ref 2.3–15.9)
RBC.: 2.36 MIL/uL — ABNORMAL LOW (ref 4.22–5.81)
Retic Count, Absolute: 28.6 10*3/uL (ref 19.0–186.0)
Retic Ct Pct: 1.2 % (ref 0.4–3.1)

## 2023-05-06 LAB — ABO/RH: ABO/RH(D): A POS

## 2023-05-06 LAB — FERRITIN: Ferritin: 543 ng/mL — ABNORMAL HIGH (ref 24–336)

## 2023-05-06 LAB — FOLATE: Folate: 21 ng/mL (ref >5.9)

## 2023-05-06 MED ORDER — MIDODRINE HCL 5 MG PO TABS
5.0000 mg | ORAL_TABLET | Freq: Three times a day (TID) | ORAL | Status: DC
  Administered 2023-05-06 – 2023-05-07 (×3): 5 mg via ORAL
  Filled 2023-05-06 (×3): qty 1

## 2023-05-06 MED ORDER — LACTATED RINGERS IV SOLN: INTRAVENOUS | Status: DC

## 2023-05-06 NOTE — Progress Notes (Signed)
 PROGRESS NOTE   Terrance Miller  ZOX:[id number] DOB: [birth year] DOA: 05/04/2023 PCP: System, Provider Not In   Chief Complaint  Patient presents with   Nausea   Emesis   Level of care: ICU  Brief Admission History:  89 year old male with advanced vascular dementia currently residing at [facility] with past medical history significant for HFpEF, hyperlipidemia, hypertension, chronic thrombocytopenia, MGUS, glaucoma, chronic constipation, CAD status post PCI, type 2 diabetes mellitus, hyperlipidemia, history of MI in 2004, history of fecal impaction removal in January 2024 by Dr. Sammi Crick, BPH with history of hydrocelectomy by Dr. Oda Bence who was recently discharged from this hospital on 04/27/2023 after treatment for Enterobacter UTI, metabolic encephalopathy.  Patient was sent from [facility] today for nausea and vomiting that started around 5 AM.  Emesis was reported to be very dark in color.  Patient denied abdominal pain but felt bloating in the abdomen.  He is currently undergoing therapy for urinary tract infection.  He was ill-appearing with abdominal distention and tenderness.  CT abdomen demonstrating stercoral colitis with fluid-filled small intestine and stomach.  Surgery and GI was consulted and medicine will be admitting for further management in the hospital.   Assessment and Plan:  Severe Sepsis with septic Shock - pt became hypotensive overnight and required IV norepinephrine infusion - he was bolused with LR for lactic acidosis - he remains on IV antibiotics added vancomycin  given MRSA screen positive - blood cultures are pending - he is on a warming blanket to improve core temperature   Recurrent rectal stool impaction Stercoral colitis  Chronic constipation  Ileus from constipation - appreciate GI consultation and recommendations - for now they are starting with molasses enema treatments - per [facility] - [place] laxatives only given PRN at the facility  - NGT  placed for decompression to low intermittent suction but patient pulled out overnight - general surgery consulted in case there is a perforation requiring operative management - discussed with GI service, ok to start clears diet and monitor closely  - bowel regimen as per GI recommendations   Hypotension - Improving  - secondary to severe sepsis with septic shock  - was briefly on IV norepinephrine infusion, now on oral midodrine 5 mg TID for BP support - continue weaning down midodrine to hopefully stop in next 1-2 days - follow blood cultures, no growth to date  - continue IV zosyn  added vancomycin  - IV fluid bolus of LR followed by maintenance fluids - continue to keep in stepdown ICU in case pressor therapies are required    Hypothermia - Improving  - secondary to severe sepsis with septic shock - warming blanket ordered - core body temperature improving    Chronic HFpEF - appears compensated currently, follow - holding blood pressure lowering agents for now given soft BPs   BPH - he is having urinary retention likely from obstipation - foley catheter placement ordered and urine output being monitoried - remains critically ill in ICU so we will continue foley for now, DC foley in next 1-2 days if improving   Glaucoma - resumed home medication  Macrocytic Anemia - checking anemia panel with B12 and folate included  - CBC in Am    DVT prophylaxis: enoxaparin  Code Status: DNR  Family Communication: daughter Disposition: TBD but likely to return to LTC at [facility]   Consultants:  GI Procedures:   Antimicrobials:    Subjective: Pt remains critically ill, he is not eating or drinking well, he is more alert today,  difficult to understand his speech and what he is trying to communicate.   Objective: Vitals:   05/06/23 0900 05/06/23 1000 05/06/23 1100 05/06/23 1200  BP:  (!) 93/32 (!) 138/50 (!) 121/47  Pulse: 60 (!) 56 (!) 55 (!) 52  Resp: 16 14 14  (!) 27   Temp:      TempSrc:      SpO2: 95% 94% 99% 99%  Weight:      Height:        Intake/Output Summary (Last 24 hours) at 05/06/2023 1319 Last data filed at 05/06/2023 0857 Gross per 24 hour  Intake 2701.18 ml  Output 700 ml  Net 2001.18 ml   Filed Weights   05/04/23 0652 05/04/23 1400  Weight: 92.8 kg 82.5 kg   Examination:  General exam: frail elderly male, lying supine in bed; unintelligible speech; Appears calm and comfortable  Respiratory system: Clear to auscultation. Respiratory effort normal. Cardiovascular system: normal S1 & S2 heard. No JVD, murmurs, rubs, gallops or clicks. No pedal edema. Gastrointestinal system: Abdomen is mildly distended but much improved from prior, and with generalized tenderness. No organomegaly or masses felt. Normal bowel sounds heard. Central nervous system: Alert and oriented x1. No focal neurological deficits. Extremities: Symmetric 5 x 5 power. Skin: No rashes, lesions or ulcers. Psychiatry: Judgement and insight UTD. Mood & affect appropriate.   Data Reviewed: I have personally reviewed following labs and imaging studies  CBC: Recent Labs  Lab 05/04/23 0708 05/04/23 1522 05/05/23 0201 05/06/23 0443  WBC 7.0  --  16.3* 6.5  NEUTROABS 5.6  --  14.5* 4.4  HGB 11.3* 10.5* 9.6* 8.1*  HCT 34.1* 32.4* 29.6* 24.9*  MCV 105.6*  --  107.6* 106.9*  PLT 146*  --  126* 99*    Basic Metabolic Panel: Recent Labs  Lab 05/04/23 0708 05/05/23 0201 05/06/23 0443  NA 138 141 138  K 4.4 4.2 3.8  CL 106 110 110  CO2 22 21* 21*  GLUCOSE 101* 80 75  BUN 39* 49* 43*  CREATININE 1.63* 2.02* 2.24*  CALCIUM  9.4 8.5* 8.5*  MG  --  2.0  --     CBG: Recent Labs  Lab 05/05/23 0004 05/05/23 0605 05/05/23 1136 05/06/23 0013 05/06/23 0607  GLUCAP 74 103* 85 74 72    Recent Results (from the past 240 hours)  Culture, blood (Routine X 2) w Reflex to ID Panel     Status: None (Preliminary result)   Collection Time: 05/04/23  1:09 PM    Specimen: BLOOD  Result Value Ref Range Status   Specimen Description BLOOD BLOOD LEFT HAND  Final   Special Requests NONE  Final   Culture   Final    NO GROWTH 2 DAYS Performed at [facility], [address] [place], [place] [address]    Report Status PENDING  Incomplete  Culture, blood (Routine X 2) w Reflex to ID Panel     Status: None (Preliminary result)   Collection Time: 05/04/23  1:25 PM   Specimen: BLOOD  Result Value Ref Range Status   Specimen Description   Final    BLOOD BLOOD RIGHT HAND Performed at [facility], [address], [place], [place] [address]    Special Requests   Final    BOTTLES DRAWN AEROBIC AND ANAEROBIC Blood Culture results may not be optimal due to an inadequate volume of blood received in culture bottles Performed at [facility], [address], [place], [place] [address]  Culture  Setup Time   Final    GRAM POSITIVE COCCI AEROBIC BOTTLE ONLY Gram Stain Report Called to,Read Back By and Verified With: S. Judene Noss, RN AT 2227 05/05/23 BY A. SNYDER Organism ID to follow CRITICAL RESULT CALLED TO, READ BACK BY AND VERIFIED WITH: A SHELTON,RN@0705  05/06/23 MK Performed at [facility], [address] [place], [place] [address]    Culture GRAM POSITIVE COCCI  Final   Report Status PENDING  Incomplete  Blood Culture ID Panel (Reflexed)     Status: Abnormal   Collection Time: 05/04/23  1:25 PM  Result Value Ref Range Status   Enterococcus faecalis NOT DETECTED NOT DETECTED Final   Enterococcus Faecium NOT DETECTED NOT DETECTED Final   Listeria monocytogenes NOT DETECTED NOT DETECTED Final   Staphylococcus species DETECTED (A) NOT DETECTED Final    Comment: CRITICAL RESULT CALLED TO, READ BACK BY AND VERIFIED WITH: A SHELTON,RN@0707  05/06/23 MK    Staphylococcus aureus (BCID) NOT DETECTED NOT DETECTED Final   Staphylococcus epidermidis DETECTED (A) NOT DETECTED Final    Comment: CRITICAL RESULT CALLED TO, READ BACK BY AND VERIFIED  WITH: A SHELTON,RN@0707  05/06/23 MK    Staphylococcus lugdunensis NOT DETECTED NOT DETECTED Final   Streptococcus species NOT DETECTED NOT DETECTED Final   Streptococcus agalactiae NOT DETECTED NOT DETECTED Final   Streptococcus pneumoniae NOT DETECTED NOT DETECTED Final   Streptococcus pyogenes NOT DETECTED NOT DETECTED Final   A.calcoaceticus-baumannii NOT DETECTED NOT DETECTED Final   Bacteroides fragilis NOT DETECTED NOT DETECTED Final   Enterobacterales NOT DETECTED NOT DETECTED Final   Enterobacter cloacae complex NOT DETECTED NOT DETECTED Final   Escherichia coli NOT DETECTED NOT DETECTED Final   Klebsiella aerogenes NOT DETECTED NOT DETECTED Final   Klebsiella oxytoca NOT DETECTED NOT DETECTED Final   Klebsiella pneumoniae NOT DETECTED NOT DETECTED Final   Proteus species NOT DETECTED NOT DETECTED Final   Salmonella species NOT DETECTED NOT DETECTED Final   Serratia marcescens NOT DETECTED NOT DETECTED Final   Haemophilus influenzae NOT DETECTED NOT DETECTED Final   Neisseria meningitidis NOT DETECTED NOT DETECTED Final   Pseudomonas aeruginosa NOT DETECTED NOT DETECTED Final   Stenotrophomonas maltophilia NOT DETECTED NOT DETECTED Final   Candida albicans NOT DETECTED NOT DETECTED Final   Candida auris NOT DETECTED NOT DETECTED Final   Candida glabrata NOT DETECTED NOT DETECTED Final   Candida krusei NOT DETECTED NOT DETECTED Final   Candida parapsilosis NOT DETECTED NOT DETECTED Final   Candida tropicalis NOT DETECTED NOT DETECTED Final   Cryptococcus neoformans/gattii NOT DETECTED NOT DETECTED Final   Methicillin resistance mecA/C NOT DETECTED NOT DETECTED Final    Comment: Performed at [facility], [address] [place], [place] [address]  MRSA Next Gen by PCR, Nasal     Status: Abnormal   Collection Time: 05/04/23  1:36 PM   Specimen: Nasal Mucosa; Nasal Swab  Result Value Ref Range Status   MRSA by PCR Next Gen DETECTED (A) NOT DETECTED Final    Comment:  RESULT CALLED TO, READ BACK BY AND VERIFIED WITH: L IRVING AT 1925 ON [id number] BY S DALTON (NOTE) The GeneXpert MRSA Assay (FDA approved for NASAL specimens only), is one component of a comprehensive MRSA colonization surveillance program. It is not intended to diagnose MRSA infection nor to guide or monitor treatment for MRSA infections. Test performance is not FDA approved in patients less than 73 years old. Performed at [facility], [address], [place],  [place] [address]      Radiology Studies: DG Abd 1 View Result Date: 05/05/2023 CLINICAL DATA:  Ileus. EXAM: ABDOMEN - 1 VIEW COMPARISON:  05/04/2023 FINDINGS: Temperature probe is now seen within the urinary bladder. Gaseous distention of the colon shows mild decrease since previous study, suggesting improving ileus. IMPRESSION: Mild improvement in colonic ileus. Electronically Signed   By: Marlyce Sine M.D.   On: 05/05/2023 08:27   DG Abd 1 View Result Date: 05/04/2023 CLINICAL DATA:  Nasogastric tube placement. EXAM: ABDOMEN - 1 VIEW COMPARISON:  CT earlier today FINDINGS: Tip and side port of the enteric tube is below the diaphragm in the stomach. The small-bowel distention on CT is not seen on the current exam. Mild gaseous distention of colon. Left colonic stool persists. Radiopaque densities in the left abdomen air in the subcutaneous tissues on C2 IMPRESSION: Tip and side port of the enteric tube below the diaphragm in the stomach. Electronically Signed   By: Chadwick Colonel M.D.   On: 05/04/2023 16:47    Scheduled Meds:  aspirin  EC  81 mg Oral Q breakfast   Chlorhexidine  Gluconate Cloth  6 each Topical Daily   enoxaparin  (LOVENOX ) injection  30 mg Subcutaneous Q24H   feeding supplement  1 Container Oral TID BM   folic acid   1 mg Oral Daily   latanoprost   1 drop Both Eyes QHS   melatonin  3 mg Oral QHS   memantine  5 mg Oral Daily   midodrine  10 mg Oral TID WC   mupirocin  ointment  1 Application Nasal BID   pantoprazole  (PROTONIX) IV  40 mg Intravenous Q12H   polyethylene glycol  17 g Oral TID   rosuvastatin   5 mg Oral Daily   tamsulosin   0.4 mg Oral QPC supper   Continuous Infusions:  lactated ringers  60 mL/hr at 05/06/23 0827   norepinephrine (LEVOPHED) Adult infusion Stopped (05/05/23 0831)   piperacillin -tazobactam (ZOSYN )  IV 3.375 g (05/06/23 0632)   [START ON 05/07/2023] vancomycin        LOS: 2 days   Critical Care Procedure Note Authorized and Performed by: Olga Berthold MD  Total Critical Care time:  55 mins Due to a high probability of clinically significant, life threatening deterioration, the patient required my highest level of preparedness to intervene emergently and I personally spent this critical care time directly and personally managing the patient.  This critical care time included obtaining a history; examining the patient, pulse oximetry; ordering and review of studies; arranging urgent treatment with development of a management plan; evaluation of patient's response of treatment; frequent reassessment; and discussions with other providers.  This critical care time was performed to assess and manage the high probability of imminent and life threatening deterioration that could result in multi-organ failure.  It was exclusive of separately billable procedures and treating other patients and teaching time.   Faustino Hook, MD How to contact the [facility] Attending or Consulting provider 7A - 7P or covering provider during after hours 7P -7A, for this patient?  Check the care team in [facility] and look for a) attending/consulting [facility] provider listed and b) the [facility] team listed Log into [email or web address] to find provider on call.  Locate the [facility] provider you are looking for under [facility] and page to a number that you can be directly reached. If you still have difficulty reaching the provider, please page the [facility] (Director on Call) for the Hospitalists listed on amion for assistance.  05/06/2023,  1:19 PM

## 2023-05-06 NOTE — Evaluation (Signed)
 Physical Therapy Evaluation Patient Details Name: Terrance Miller MRN: [id number] DOB: Sep 21, 1928 Today's Date: 05/06/2023  History of Present Illness  Terrance Miller is a 89 year old male with advanced vascular dementia currently residing at [facility] with past medical history significant for HFpEF, hyperlipidemia, hypertension, chronic thrombocytopenia, MGUS, glaucoma, chronic constipation, CAD status post PCI, type 2 diabetes mellitus, hyperlipidemia, history of MI in 2004, history of fecal impaction removal in January 2024 by Dr. Sammi Crick, BPH with history of hydrocelectomy by Dr. Oda Bence who was recently discharged from this hospital on 04/27/2023 after treatment for Enterobacter UTI, metabolic encephalopathy.     Patient was sent from [facility] today for nausea and vomiting that started around 5 AM.  Emesis was reported to be very dark in color.  Patient denied abdominal pain but felt bloating in the abdomen.  He is currently undergoing therapy for urinary tract infection.  He was ill-appearing with abdominal distention and tenderness.  CT abdomen demonstrating stercoral colitis with fluid-filled small intestine and stomach.  Surgery and GI was consulted and medicine will be admitting for further management in the hospital.   Clinical Impression  Patient demonstrates slow labored movement for sitting up at bedside, fair/good sitting balance, able to take a few steps forward/backwards at bedside before having to sit due to fatigue and generalized weakness.  Patient tolerated sitting up in chair after therapy. Patient will benefit from continued skilled physical therapy in hospital and recommended venue below to increase strength, balance, endurance for safe ADLs and gait.           If plan is discharge home, recommend the following: A little help with bathing/dressing/bathroom;Help with stairs or ramp for entrance;Assistance with cooking/housework;A lot of help with  bathing/dressing/bathroom   Can travel by private vehicle   Yes    Equipment Recommendations None recommended by PT  Recommendations for Other Services       Functional Status Assessment Patient has had a recent decline in their functional status and demonstrates the ability to make significant improvements in function in a reasonable and predictable amount of time.     Precautions / Restrictions Precautions Precautions: Fall Restrictions Weight Bearing Restrictions Per Provider Order: No      Mobility  Bed Mobility Overal bed mobility: Needs Assistance Bed Mobility: Supine to Sit     Supine to sit: Min assist, Mod assist     General bed mobility comments: increased time, labored movement    Transfers Overall transfer level: Needs assistance Equipment used: Rolling walker (2 wheels) Transfers: Sit to/from Stand, Bed to chair/wheelchair/BSC Sit to Stand: Min assist   Step pivot transfers: Min assist, Mod assist       General transfer comment: unsteady labored movement    Ambulation/Gait Ambulation/Gait assistance: Mod assist Gait Distance (Feet): 10 Feet Assistive device: Rolling walker (2 wheels) Gait Pattern/deviations: Decreased step length - right, Decreased step length - left, Decreased stride length Gait velocity: slow     General Gait Details: limited to a few steps forward/backwards at bedside before having to sit due to fatigue and generalized weaknes  Stairs            Wheelchair Mobility     Tilt Bed    Modified Rankin (Stroke Patients Only)       Balance Overall balance assessment: Needs assistance Sitting-balance support: Feet supported, No upper extremity supported Sitting balance-Leahy Scale: Fair Sitting balance - Comments: fair/good seated at EOB   Standing balance support: Reliant on assistive device for balance,  During functional activity, Bilateral upper extremity supported Standing balance-Leahy Scale: Fair Standing  balance comment: using RW                             Pertinent Vitals/Pain Pain Assessment Pain Assessment: No/denies pain    Home Living Family/patient expects to be discharged to:: Private residence Living Arrangements: Children Available Help at Discharge: Family;Available 24 hours/day Type of Home: House Home Access: Stairs to enter   Entrance Stairs-Number of Steps: 3 Alternate Level Stairs-Number of Steps: 4 Home Layout: Able to live on main level with bedroom/bathroom;Two level Home Equipment: Toilet riser;Cane - single point;Rolling Walker (2 wheels);Rollator (4 wheels)      Prior Function Prior Level of Function : Needs assist       Physical Assist : Mobility (physical);ADLs (physical) Mobility (physical): Transfers;Gait;Bed mobility;Stairs   Mobility Comments: household ambulator using RW. pt reports assist from daughter and son in law that pt given assist for sit/stand transfers and ambulates independently with RW. ADLs Comments: Assisted with bathing and dressing per pt's reports. Chart review indicates assist for IADL's as well. Pt confirms.     Extremity/Trunk Assessment   Upper Extremity Assessment Upper Extremity Assessment: Generalized weakness    Lower Extremity Assessment Lower Extremity Assessment: Generalized weakness    Cervical / Trunk Assessment Cervical / Trunk Assessment: Normal  Communication   Communication Communication: No apparent difficulties;Other (comment) Factors Affecting Communication: Other (comment) (patient has poor eyesight)    Cognition Arousal: Alert Behavior During Therapy: WFL for tasks assessed/performed   PT - Cognitive impairments: History of cognitive impairments                         Following commands: Intact       Cueing Cueing Techniques: Verbal cues, Tactile cues     General Comments      Exercises     Assessment/Plan    PT Assessment Patient needs continued PT services   PT Problem List Decreased strength;Decreased activity tolerance;Decreased balance;Decreased mobility       PT Treatment Interventions DME instruction;Gait training;Stair training;Functional mobility training;Therapeutic activities;Therapeutic exercise;Balance training;Patient/family education    PT Goals (Current goals can be found in the Care Plan section)  Acute Rehab PT Goals Patient Stated Goal: return home with family to assist PT Goal Formulation: With patient/family Time For Goal Achievement: 05/20/23 Potential to Achieve Goals: Good    Frequency Min 3X/week     Co-evaluation               AM-PAC PT "6 Clicks" Mobility  Outcome Measure Help needed turning from your back to your side while in a flat bed without using bedrails?: A Little Help needed moving from lying on your back to sitting on the side of a flat bed without using bedrails?: A Little Help needed moving to and from a bed to a chair (including a wheelchair)?: A Lot Help needed standing up from a chair using your arms (e.g., wheelchair or bedside chair)?: A Lot Help needed to walk in hospital room?: A Lot Help needed climbing 3-5 steps with a railing? : A Lot 6 Click Score: 14    End of Session   Activity Tolerance: Patient tolerated treatment well;Patient limited by fatigue Patient left: in chair;with call bell/phone within reach Nurse Communication: Mobility status PT Visit Diagnosis: Unsteadiness on feet (R26.81);Other abnormalities of gait and mobility (R26.89);Muscle weakness (generalized) (M62.81)  Time: 1610-9604 PT Time Calculation (min) (ACUTE ONLY): 26 min   Charges:   PT Evaluation $PT Eval Moderate Complexity: 1 Mod PT Treatments $Therapeutic Activity: 23-37 mins PT General Charges $$ ACUTE PT VISIT: 1 Visit         2:12 PM, 05/06/23 Walton Guppy, MPT Physical Therapist with [facility] [phone] office [phone] mobile phone

## 2023-05-06 NOTE — Plan of Care (Signed)
  Problem: Education: Goal: Knowledge of General Education information will improve Description: Including pain rating scale, medication(s)/side effects and non-pharmacologic comfort measures Outcome: Progressing   Problem: Coping: Goal: Level of anxiety will decrease Outcome: Progressing   Problem: Pain Managment: Goal: General experience of comfort will improve and/or be controlled Outcome: Progressing   Problem: Safety: Goal: Ability to remain free from injury will improve Outcome: Progressing   Problem: Coping: Goal: Ability to adjust to condition or change in health will improve Outcome: Progressing

## 2023-05-06 NOTE — Plan of Care (Signed)
  Problem: Acute Rehab PT Goals(only PT should resolve) Goal: Pt Will Go Supine/Side To Sit Outcome: Progressing Flowsheets (Taken 05/06/2023 1413) Pt will go Supine/Side to Sit:  with contact guard assist  with minimal assist Goal: Patient Will Transfer Sit To/From Stand Outcome: Progressing Flowsheets (Taken 05/06/2023 1413) Patient will transfer sit to/from stand:  with contact guard assist  with minimal assist Goal: Pt Will Transfer Bed To Chair/Chair To Bed Outcome: Progressing Flowsheets (Taken 05/06/2023 1413) Pt will Transfer Bed to Chair/Chair to Bed:  with contact guard assist  with min assist Goal: Pt Will Ambulate Outcome: Progressing Flowsheets (Taken 05/06/2023 1413) Pt will Ambulate:  50 feet  with minimal assist  with rolling walker   2:14 PM, 05/06/23 Walton Guppy, MPT Physical Therapist with [facility] [phone] office [phone] mobile phone

## 2023-05-06 NOTE — [place] FL2 (Signed)
 [place]  MEDICAID FL2 LEVEL OF CARE FORM     IDENTIFICATION  Patient Name: Terrance Miller Birthdate: Jun 05, 1928 Sex: male Admission Date (Current Location): 05/04/2023  [place] and [place] Number:  Terrance Miller [id number] Facility and Address:  [facility],  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/04/2023   (HFpEF) heart failure with preserved ejection fraction (HCC) 05/04/2023   Nausea with vomiting 05/04/2023   DNR (do not resuscitate) 05/04/2023   FTT (failure to thrive) in adult 05/04/2023   UTI (urinary tract infection) 04/21/2023   Thrombocytopenia (HCC) 04/21/2023   Acute on chronic diastolic CHF (congestive heart failure) (HCC) 04/21/2023   Obesity, Class III, BMI 40-49.9 (morbid obesity) (HCC) 04/21/2023   Injury to scrotum, penis, or foreskin 03/18/2023   Type 2 diabetes mellitus with chronic kidney disease, with long-term current use of insulin (HCC) 12/20/2022   Glaucoma 12/20/2022   Vascular dementia without behavioral disturbance (HCC) 08/15/2022   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   Aortic atherosclerosis (HCC) 08/05/2022   Chronic constipation 08/05/2022   Increased intraocular pressure, bilateral 08/05/2022   Edema, peripheral 08/05/2022   Aortic regurgitation 03/17/2022   Pulmonary HTN (HCC) 03/17/2022   Dry skin dermatitis 02/05/2022   Stage 3b chronic kidney disease (HCC) 11/05/2021   MGUS (monoclonal gammopathy of unknown significance) 11/05/2021   Acute metabolic encephalopathy 07/27/2021   Macrocytic anemia  05/31/2021   History of MI (myocardial infarction) 04/29/2021   Hypothermia 05/20/2017   Severe sepsis with septic shock (HCC) 05/20/2017   Essential hypertension 09/28/2013   Hyperlipidemia LDL goal <70 09/28/2013   CAD (coronary artery disease) 09/28/2013    Orientation RESPIRATION BLADDER Height & Weight     Self, Place, Time  Normal Indwelling catheter Weight: 181 lb 14.1 oz (82.5 kg) Height:  5\' 6"  (167.6 cm)  BEHAVIORAL SYMPTOMS/MOOD NEUROLOGICAL BOWEL NUTRITION STATUS      Incontinent Diet (See d/c summary)  AMBULATORY STATUS COMMUNICATION OF NEEDS Skin   Extensive Assist Verbally Bruising, Other (Comment) (Stage 2 to scrotum with foam dressing.)                       Personal Care Assistance Level of Assistance  Bathing, Feeding, Dressing Bathing Assistance: Maximum assistance Feeding assistance: Limited assistance Dressing Assistance: Maximum assistance     Functional Limitations Info  Sight, Hearing Sight Info: Impaired Hearing Info: Impaired Speech Info: Adequate    SPECIAL CARE FACTORS FREQUENCY  PT (By licensed PT)     PT Frequency: 5x weekly              Contractures      Additional Factors Info  Code Status, Allergies Code Status Info: DNR-Limited Allergies Info: Clobetasol            Current Medications (05/06/2023):  This is the current hospital active medication list Current Facility-Administered Medications  Medication Dose Route Frequency Provider Last Rate Last Admin   acetaminophen  (TYLENOL ) tablet 650 mg  650 mg Oral Q6H PRN Rayfield Cairo, MD  Or   acetaminophen  (TYLENOL ) suppository 650 mg  650 mg Rectal Q6H PRN Johnson, Clanford L, MD       aspirin  EC tablet 81 mg  81 mg Oral Q breakfast Johnson, Clanford L, MD   81 mg at 05/06/23 0825   Chlorhexidine  Gluconate Cloth 2 % PADS 6 each  6 each Topical Daily Lincoln Renshaw, Clanford L, MD   6 each at 05/06/23 0827   enoxaparin  (LOVENOX ) injection 30 mg  30 mg Subcutaneous Q24H  Johnson, Clanford L, MD   30 mg at 05/05/23 2108   feeding supplement (BOOST / RESOURCE BREEZE) liquid 1 Container  1 Container Oral TID BM Johnson, Clanford L, MD   1 Container at 05/06/23 0827   fentaNYL  (SUBLIMAZE ) injection 12.5-25 mcg  12.5-25 mcg Intravenous Q2H PRN Johnson, Clanford L, MD       folic acid  (FOLVITE ) tablet 1 mg  1 mg Oral Daily Johnson, Clanford L, MD   1 mg at 05/06/23 0825   lactated ringers  infusion   Intravenous Continuous Faustino Hook L, MD 60 mL/hr at 05/06/23 0827 New Bag at 05/06/23 0827   latanoprost  (XALATAN ) 0.005 % ophthalmic solution 1 drop  1 drop Both Eyes QHS Johnson, Clanford L, MD   1 drop at 05/05/23 2118   melatonin tablet 3 mg  3 mg Oral QHS Johnson, Clanford L, MD   3 mg at 05/05/23 2115   memantine (NAMENDA) tablet 5 mg  5 mg Oral Daily Johnson, Clanford L, MD   5 mg at 05/06/23 0825   midodrine (PROAMATINE) tablet 10 mg  10 mg Oral TID WC Segars, Jonathan, MD   10 mg at 05/06/23 0825   mupirocin  ointment (BACTROBAN ) 2 % 1 Application  1 Application Nasal BID Segars, Jonathan, MD   1 Application at 05/06/23 0826   norepinephrine (LEVOPHED) 4mg  in (0.016 mg/mL) premix infusion  2-10 mcg/min Intravenous Titrated Segars, Jonathan, MD   Stopped at 05/05/23 0831   ondansetron  (ZOFRAN ) tablet 4 mg  4 mg Oral Q6H PRN Johnson, Clanford L, MD       Or   ondansetron  (ZOFRAN ) injection 4 mg  4 mg Intravenous Q6H PRN Johnson, Clanford L, MD       pantoprazole (PROTONIX) injection 40 mg  40 mg Intravenous Q12H Delman Ferns, NP   40 mg at 05/06/23 0825   piperacillin -tazobactam (ZOSYN ) IVPB 3.375 g  3.375 g Intravenous Q8H Olin Bertin, RPH 12.5 mL/hr at 05/06/23 1610 3.375 g at 05/06/23 9604   polyethylene glycol (MIRALAX  / GLYCOLAX ) packet 17 g  17 g Oral TID Harper, Kristen S, PA-C   17 g at 05/06/23 5409   prochlorperazine (COMPAZINE) injection 10 mg  10 mg Intravenous Q4H PRN Johnson, Clanford L, MD       rosuvastatin  (CRESTOR ) tablet 5 mg  5 mg  Oral Daily Johnson, Clanford L, MD   5 mg at 05/06/23 0825   tamsulosin  (FLOMAX ) capsule 0.4 mg  0.4 mg Oral QPC supper Johnson, Clanford L, MD   0.4 mg at 05/05/23 1804   [START ON 05/07/2023] vancomycin  (VANCOREADY) IVPB 1250 mg/250 mL  1,250 mg Intravenous Q48H Johnson, Clanford L, MD         Discharge Medications: Please see discharge summary for a list of discharge medications.  Relevant Imaging Results:  Relevant Lab Results:   Additional Information SS# [id number]  Ander Kat[place], [place]

## 2023-05-06 NOTE — Progress Notes (Addendum)
 Subjective: Patient sitting up in chair, states he is feeling okay. Denies BMs this morning. No abdominal pain, nausea or vomiting. Clear liquid tray on bedside though stated he had not had any of it.   Objective: Vital signs in last 24 hours: Temp:  [98 F (36.7 C)-98.6 F (37 C)] 98 F (36.7 C) (04/30 0801) Pulse Rate:  [49-73] 55 (04/30 0700) Resp:  [2-30] 8 (04/30 0700) BP: (82-158)/(38-65) 142/61 (04/30 0700) SpO2:  [91 %-100 %] 97 % (04/30 0700) Last BM Date : 05/04/23 General:   Alert and oriented, pleasant Head:  Normocephalic and atraumatic. Eyes:  No icterus, sclera clear. Conjuctiva pink.  Mouth:  Without lesions, mucosa pink and moist.  Heart:  S1, S2 present, no murmurs noted.  Lungs: Clear to auscultation bilaterally, without wheezing, rales, or rhonchi.  Abdomen:  Bowel sounds present, normal, abdomen mildly distended but soft. No HSM or hernias noted. No rebound or guarding. No masses appreciated  Neurologic:  Alert and  oriented x4;  grossly normal neurologically. Skin:  Warm and dry, intact without significant lesions.  Psych:  Alert and cooperative. Normal mood and affect.  Intake/Output from previous day: 04/29 0701 - 04/30 0700 In: 3689.4 [P.O.:150; I.V.:3089.4; IV Piggyback:450.1] Out: 1150 [Urine:1150] Intake/Output this shift: Total I/O In: 240 [P.O.:240] Out: -   Lab Results: Recent Labs    05/04/23 0708 05/04/23 1522 05/05/23 0201 05/06/23 0443  WBC 7.0  --  16.3* 6.5  HGB 11.3* 10.5* 9.6* 8.1*  HCT 34.1* 32.4* 29.6* 24.9*  PLT 146*  --  126* 99*   BMET Recent Labs    05/04/23 0708 05/05/23 0201 05/06/23 0443  NA 138 141 138  K 4.4 4.2 3.8  CL 106 110 110  CO2 22 21* 21*  GLUCOSE 101* 80 75  BUN 39* 49* 43*  CREATININE 1.63* 2.02* 2.24*  CALCIUM  9.4 8.5* 8.5*   LFT Recent Labs    05/04/23 0708 05/05/23 0201 05/06/23 0443  PROT 8.1 6.7 6.2*  ALBUMIN 3.7 3.1* 2.8*  AST 41 31 27  ALT 29 21 18   ALKPHOS 85 65 60  BILITOT  1.7* 2.3* 2.4*    Studies/Results: DG Abd 1 View Result Date: 05/05/2023 CLINICAL DATA:  Ileus. EXAM: ABDOMEN - 1 VIEW COMPARISON:  05/04/2023 FINDINGS: Temperature probe is now seen within the urinary bladder. Gaseous distention of the colon shows mild decrease since previous study, suggesting improving ileus. IMPRESSION: Mild improvement in colonic ileus. Electronically Signed   By: Marlyce Sine M.D.   On: 05/05/2023 08:27   DG Abd 1 View Result Date: 05/04/2023 CLINICAL DATA:  Nasogastric tube placement. EXAM: ABDOMEN - 1 VIEW COMPARISON:  CT earlier today FINDINGS: Tip and side port of the enteric tube is below the diaphragm in the stomach. The small-bowel distention on CT is not seen on the current exam. Mild gaseous distention of colon. Left colonic stool persists. Radiopaque densities in the left abdomen air in the subcutaneous tissues on C2 IMPRESSION: Tip and side port of the enteric tube below the diaphragm in the stomach. Electronically Signed   By: Chadwick Colonel M.D.   On: 05/04/2023 16:47   CT ABDOMEN PELVIS WO CONTRAST Result Date: 05/04/2023 CLINICAL DATA:  Nausea and vomiting today. Bowel obstruction suspected. EXAM: CT ABDOMEN AND PELVIS WITHOUT CONTRAST TECHNIQUE: Multidetector CT imaging of the abdomen and pelvis was performed following the standard protocol without IV contrast. RADIATION DOSE REDUCTION: This exam was performed according to the departmental dose-optimization program which includes automated  exposure control, adjustment of the mA and/or kV according to patient size and/or use of iterative reconstruction technique. COMPARISON:  Abdominopelvic CT 01/27/2022. FINDINGS: Technical note: Despite efforts by the technologist and patient, mild motion artifact is present on today's exam and could not be eliminated. This reduces exam sensitivity and specificity. Lower chest: Mildly increased dependent opacities at both lung bases, most consistent with atelectasis. The distal  esophagus is mildly dilated and fluid-filled. There is aortic and coronary artery atherosclerosis. Moderate bilateral gynecomastia noted. Hepatobiliary: No focal hepatic abnormalities are identified on noncontrast imaging. Dependent high density within the gallbladder lumen suspicious for small gallstones. No evidence of gallbladder wall thickening or significant biliary dilatation. Pancreas: Unremarkable. No pancreatic ductal dilatation or surrounding inflammatory changes. Spleen: Normal in size without focal abnormality. Adrenals/Urinary Tract: Both adrenal glands appear normal. No evidence of urinary tract calculus, suspicious renal lesion or hydronephrosis. Grossly stable renal cysts bilaterally for which no specific follow-up imaging is recommended. The urinary bladder is decompressed without focal abnormality. Stomach/Bowel: No enteric contrast administered. The stomach is moderately distended and fluid-filled. There are multiple mildly dilated loops of fluid-filled small bowel without focal transition point. The proximal colon is fluid-filled without significant distension. There is moderate stool throughout the distal colon. There is mild circumferential rectal wall thickening with perirectal soft tissue stranding. No other bowel wall thickening or surrounding inflammation identified. Vascular/Lymphatic: There are no enlarged abdominal or pelvic lymph nodes. Aortic and branch vessel atherosclerosis without evidence of aneurysm. Reproductive: The prostate gland and seminal vesicles appear unremarkable. Other: Again demonstrated are bilateral inguinal hernias containing fat. The bladder and a small amount of fluid extend into the left inguinal hernia. There is no definite herniated bowel. No ascites, focal extraluminal fluid collection or pneumoperitoneum. Musculoskeletal: No acute or significant osseous findings. Multilevel spondylosis. IMPRESSION: 1. Mild circumferential rectal wall thickening with  perirectal soft tissue stranding, suspicious for recurrent stercoral colitis. 2. The stomach and small bowel are fluid-filled and mildly dilated without focal transition point, likely secondary to constipation or ileus. 3. Bilateral inguinal hernias containing fat. The bladder and a small amount of fluid extend into the left inguinal hernia. No definite herniated bowel. 4. Probable cholelithiasis without evidence of cholecystitis. 5.  Aortic Atherosclerosis (ICD10-I70.0). Electronically Signed   By: Elmon Hagedorn M.D.   On: 05/04/2023 11:08    Assessment: Terrance Miller is a 89 year old male with history of HTN, HLD, CAD, CKD, MGUS, DM, vascular dementia, HF, MI in 2004, chronic constipation, stercoral colitis in Jan 2024 s/p flex sig during admission with large manual disimpaction of stool, now presenting with nausea and vomiting from [facility], CT showing recurrent stercoral colitis, likely ileus in setting of constipation.   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 Patient did self remove NG Monday night but no recurrent vomiting.   Abd Xray yesterday with mild improvement in colonic ileus Clear liquid trial was started yesterday  Last BM was last night, confirmed with patient's RN. Abdomen mildly distended today but good bowel sounds, soft, today.  He denies nausea or vomiting  Will continue with Miralax  17gTID for now.  Sepsis: presented with hypothermia, hypotension, elevated WBC and lactic acid, likley driven by stercoral colitis, currently on zosyn , did require levophed/midodrine initially   Declining hemoglobin hgb trending down since admission. 11.3, now 8.1 this morning. B12 1249, folate 21 Iron 79, TIBC 226, sat 35, ferritin 543  Did  have reported small amount of blood per NG tube Monday, likely secondary to trauma. Also small amount of Coffee ground emesis noted in  collection container Monday night PPI increased yesterday to BID. No BRBPR or melena  Anemia likely secondary to hemodilution, in setting of large amount of IVF. Suspect CG material in NG container old blood secondary to NG tube insertion trauma  Endoscopic evaluation via EGD was discussed with patient's daughter by Hospitalist, daughter preferred to hold off unless absolutely necessary. Patient would need to be optimized medically prior to any endo evaluations.    Plan: Continue Miralax  17g TID Reinsertion of NG if recurrent N/V Monitor for overt GI bleeding Trend h&H, transfuse as necessary Continue PPI BID Clear liquids as tolerated  Mobilize patient as frequently as possible   LOS: 2 days    05/06/2023, 9:26 AM   Chelsea L. Adrien Alberta, MSN, APRN, AGNP-C Adult-Gerontology Nurse Practitioner [facility]  Attending note: Patient seen briefly this afternoon.  Agree with assessment recommendations as outlined.

## 2023-05-07 DIAGNOSIS — A419 Sepsis, unspecified organism: Secondary | ICD-10-CM | POA: Diagnosis not present

## 2023-05-07 DIAGNOSIS — K5909 Other constipation: Secondary | ICD-10-CM | POA: Diagnosis not present

## 2023-05-07 DIAGNOSIS — D649 Anemia, unspecified: Secondary | ICD-10-CM | POA: Diagnosis not present

## 2023-05-07 DIAGNOSIS — R6521 Severe sepsis with septic shock: Secondary | ICD-10-CM | POA: Diagnosis not present

## 2023-05-07 DIAGNOSIS — K5289 Other specified noninfective gastroenteritis and colitis: Secondary | ICD-10-CM | POA: Diagnosis not present

## 2023-05-07 LAB — GLUCOSE, CAPILLARY
Glucose-Capillary: 99 mg/dL (ref 70–99)
Glucose-Capillary: 85 mg/dL (ref 70–99)

## 2023-05-07 LAB — CBC WITH DIFFERENTIAL/PLATELET
Abs Immature Granulocytes: 0.02 10*3/uL (ref 0.00–0.07)
Basophils Absolute: 0 10*3/uL (ref 0.0–0.1)
Basophils Relative: 1 %
Eosinophils Absolute: 0.3 10*3/uL (ref 0.0–0.5)
Eosinophils Relative: 5 %
HCT: 25.2 % — ABNORMAL LOW (ref 39.0–52.0)
Hemoglobin: 8.3 g/dL — ABNORMAL LOW (ref 13.0–17.0)
Immature Granulocytes: 0 %
Lymphocytes Relative: 9 %
Lymphs Abs: 0.5 10*3/uL — ABNORMAL LOW (ref 0.7–4.0)
MCH: 35 pg — ABNORMAL HIGH (ref 26.0–34.0)
MCHC: 32.9 g/dL (ref 30.0–36.0)
MCV: 106.3 fL — ABNORMAL HIGH (ref 80.0–100.0)
Monocytes Absolute: 0.8 10*3/uL (ref 0.1–1.0)
Monocytes Relative: 13 %
Neutro Abs: 4.4 10*3/uL (ref 1.7–7.7)
Neutrophils Relative %: 72 %
Platelets: 103 10*3/uL — ABNORMAL LOW (ref 150–400)
RBC: 2.37 MIL/uL — ABNORMAL LOW (ref 4.22–5.81)
RDW: 24.4 % — ABNORMAL HIGH (ref 11.5–15.5)
Smear Review: DECREASED
WBC: 6 10*3/uL (ref 4.0–10.5)
nRBC: 0.7 % — ABNORMAL HIGH (ref 0.0–0.2)

## 2023-05-07 LAB — BASIC METABOLIC PANEL WITH GFR
Anion gap: 9 (ref 5–15)
BUN: 38 mg/dL — ABNORMAL HIGH (ref 8–23)
CO2: 22 mmol/L (ref 22–32)
Calcium: 8.6 mg/dL — ABNORMAL LOW (ref 8.9–10.3)
Chloride: 109 mmol/L (ref 98–111)
Creatinine, Ser: 2.08 mg/dL — ABNORMAL HIGH (ref 0.61–1.24)
GFR, Estimated: 29 mL/min — ABNORMAL LOW (ref >60)
Glucose, Bld: 81 mg/dL (ref 70–99)
Potassium: 3.5 mmol/L (ref 3.5–5.1)
Sodium: 140 mmol/L (ref 135–145)

## 2023-05-07 LAB — CULTURE, BLOOD (ROUTINE X 2)

## 2023-05-07 LAB — MAGNESIUM: Magnesium: 2 mg/dL (ref 1.7–2.4)

## 2023-05-07 MED ORDER — MELATONIN 3 MG PO TABS
3.0000 mg | ORAL_TABLET | Freq: Every day | ORAL | Status: DC
  Filled 2023-05-07: qty 1

## 2023-05-07 MED ORDER — LACTATED RINGERS IV SOLN: INTRAVENOUS | Status: DC

## 2023-05-07 MED ORDER — MELATONIN 3 MG PO TABS
3.0000 mg | ORAL_TABLET | Freq: Every day | ORAL | Status: DC
  Administered 2023-05-07: 3 mg via ORAL
  Filled 2023-05-07: qty 1

## 2023-05-07 NOTE — Progress Notes (Signed)
 PROGRESS NOTE   Terrance Miller  NWG:[id number] DOB: October 16, 1928 DOA: 05/04/2023 PCP: System, Provider Not In   Chief Complaint  Patient presents with   Nausea   Emesis   Level of care: Stepdown  Brief Admission History:  89 year old male with advanced vascular dementia currently residing at [facility] with past medical history significant for HFpEF, hyperlipidemia, hypertension, chronic thrombocytopenia, MGUS, glaucoma, chronic constipation, CAD status post PCI, type 2 diabetes mellitus, hyperlipidemia, history of MI in 2004, history of fecal impaction removal in January 2024 by Dr. Sammi Crick, BPH with history of hydrocelectomy by Dr. Oda Bence who was recently discharged from this hospital on 04/27/2023 after treatment for Enterobacter UTI, metabolic encephalopathy.  Patient was sent from [facility] today for nausea and vomiting that started around 5 AM.  Emesis was reported to be very dark in color.  Patient denied abdominal pain but felt bloating in the abdomen.  He is currently undergoing therapy for urinary tract infection.  He was ill-appearing with abdominal distention and tenderness.  CT abdomen demonstrating stercoral colitis with fluid-filled small intestine and stomach.  Surgery and GI was consulted and medicine will be admitting for further management in the hospital.    Subjective: The patient was seen and examined this morning, more awake, alert, trying to communicate difficult to understand, comprehension impaired, pleasantly confused, Still critically ill, with poor p.o. intake  Wean off IV norepinephrine  drip BP stabilizing, will taper off midodrine    Assessment and Plan:  Severe Sepsis with septic Shock - Improved sepsis physiology  - Hypotension improved, tapered off IV norepinephrine  infusion BP currently 143/55 -Improved lactic acidosis with IVF - he remains on IV antibiotics added vancomycin  given MRSA screen positive - Blood culture-growing staph  epididymis likely contaminant - Hypothermia-resolved, current temp 97.2  Recurrent rectal stool impaction Stercoral colitis  Chronic constipation  Ileus from constipation - appreciate GI consultation and recommendations -Recommend continue bowel regimen with MiraLAX  17 g 3 times daily, clear liquid diet, PPI twice daily - S/p molasses enema treatments  - NGT placed for decompression to low intermittent suction but patient pulled out overnight - general surgery consulted in case there is a perforation requiring operative management - discussed with GI service, ok to start clears diet and monitor closely  - bowel regimen as per GI recommendations   Hypotension - Improving  - secondary to severe sepsis with septic shock  -Tapered off  IV norepinephrine  and oral midodrine  5 mg TID  - follow blood cultures, no growth to date  - continue IV zosyn  added vancomycin  - IV fluid bolus of LR - continue to keep in stepdown ICU in case pressor therapies are required    Hypothermia - Improving  - secondary to severe sepsis with septic shock    Chronic HFpEF - appears compensated currently, follow - holding blood pressure lowering agents for now given soft BPs   BPH - he is having urinary retention likely from obstipation - foley catheter placement ordered and urine output being monitoried - remains critically ill in ICU so we will continue foley for now, DC foley in next 1-2 days if improving   Glaucoma - resumed home medication  Macrocytic Anemia    Latest Ref Rng & Units 05/07/2023    4:10 AM 05/06/2023    4:43 AM 05/05/2023    2:01 AM  CBC  WBC 4.0 - 10.5 K/uL 6.0  6.5  16.3   Hemoglobin 13.0 - 17.0 g/dL 8.3  8.1  9.6   Hematocrit  39.0 - 52.0 % 25.2  24.9  29.6   Platelets 150 - 400 K/uL 103  99  126    - Total iron normal at 79, TIBC 226, ferritin 543, B12 normal at 1249, folate normal at 21.0 -Continue to monitor,   DVT prophylaxis: enoxaparin  Code Status: DNR  Family  Communication: daughter Disposition: TBD but likely to return to LTC at [facility]   Consultants:  GI Procedures:   Antimicrobials:      Objective: Vitals:   05/07/23 0800 05/07/23 0900 05/07/23 1000 05/07/23 1148  BP: (!) 146/62  (!) 143/55   Pulse: (!) 56 (!) 58    Resp: 15 (!) 21 11   Temp: 98.7 F (37.1 C)   (!) 97.2 F (36.2 C)  TempSrc: Rectal   Oral  SpO2: 94% 94%    Weight:      Height:        Intake/Output Summary (Last 24 hours) at 05/07/2023 1149 Last data filed at 05/07/2023 0508 Gross per 24 hour  Intake 1628.86 ml  Output 850 ml  Net 778.86 ml   Filed Weights   05/04/23 0652 05/04/23 1400  Weight: 92.8 kg 82.5 kg   Examination:  General:  AAO x 1,  cooperative, no distress;   HEENT:  Normocephalic, PERRL, otherwise with in Normal limits   Neuro:  CNII-XII intact. , normal motor and sensation, reflexes intact   Lungs:   Clear to auscultation BL, Respirations unlabored,  No wheezes / crackles  Cardio:    S1/S2, RRR, No murmure, No Rubs or Gallops   Abdomen:  Soft, non-tender, bowel sounds active all four quadrants, no guarding or peritoneal signs.  Muscular  skeletal:  Limited exam -global generalized weaknesses - in bed, able to move all 4 extremities,   2+ pulses,  symmetric, No pitting edema  Skin:  Dry, warm to touch, negative for any Rashes,  Wounds: Please see nursing documentation  Pressure Injury 01/28/22 Scrotum Left;Right;Posterior Stage 2 -  Partial thickness loss of dermis presenting as a shallow open injury with a red, pink wound bed without slough. Partial thickness (Active)  01/28/22 0040  Location: Scrotum  Location Orientation: Left;Right;Posterior  Staging: Stage 2 -  Partial thickness loss of dermis presenting as a shallow open injury with a red, pink wound bed without slough.  Wound Description (Comments): Partial thickness  Present on Admission: Yes  Dressing Type Gauze (Comment) 05/06/23 2027          Data Reviewed:  I have personally reviewed following labs and imaging studies  CBC: Recent Labs  Lab 05/04/23 0708 05/04/23 1522 05/05/23 0201 05/06/23 0443 05/07/23 0410  WBC 7.0  --  16.3* 6.5 6.0  NEUTROABS 5.6  --  14.5* 4.4 4.4  HGB 11.3* 10.5* 9.6* 8.1* 8.3*  HCT 34.1* 32.4* 29.6* 24.9* 25.2*  MCV 105.6*  --  107.6* 106.9* 106.3*  PLT 146*  --  126* 99* 103*    Basic Metabolic Panel: Recent Labs  Lab 05/04/23 0708 05/05/23 0201 05/06/23 0443 05/07/23 0410  NA 138 141 138 140  K 4.4 4.2 3.8 3.5  CL 106 110 110 109  CO2 22 21* 21* 22  GLUCOSE 101* 80 75 81  BUN 39* 49* 43* 38*  CREATININE 1.63* 2.02* 2.24* 2.08*  CALCIUM  9.4 8.5* 8.5* 8.6*  MG  --  2.0  --  2.0    CBG: Recent Labs  Lab 05/05/23 0004 05/05/23 0605 05/05/23 1136 05/06/23 0013 05/06/23 1610  GLUCAP  74 103* 85 74 72    Recent Results (from the past 240 hours)  Culture, blood (Routine X 2) w Reflex to ID Panel     Status: None (Preliminary result)   Collection Time: 05/04/23  1:09 PM   Specimen: BLOOD  Result Value Ref Range Status   Specimen Description BLOOD BLOOD LEFT HAND  Final   Special Requests NONE  Final   Culture   Final    NO GROWTH 3 DAYS Performed at [facility], [address] [place], [place] [address]    Report Status PENDING  Incomplete  Culture, blood (Routine X 2) w Reflex to ID Panel     Status: Abnormal   Collection Time: 05/04/23  1:25 PM   Specimen: BLOOD  Result Value Ref Range Status   Specimen Description   Final    BLOOD BLOOD RIGHT HAND Performed at [facility], [address], [place], [place] [address]    Special Requests   Final    BOTTLES DRAWN AEROBIC AND ANAEROBIC Blood Culture results may not be optimal due to an inadequate volume of blood received in culture bottles Performed at [facility], [address] [place], [place] [address]    Culture  Setup Time   Final    GRAM POSITIVE COCCI AEROBIC BOTTLE ONLY Gram Stain Report Called to,Read Back By and  Verified With: S. Judene Noss, RN AT 2227 05/05/23 BY A. SNYDER CRITICAL RESULT CALLED TO, READ BACK BY AND VERIFIED WITH: A SHELTON,RN@0705  05/06/23 MK    Culture (A)  Final    STAPHYLOCOCCUS EPIDERMIDIS THE SIGNIFICANCE OF ISOLATING THIS ORGANISM FROM A SINGLE SET OF BLOOD CULTURES WHEN MULTIPLE SETS ARE DRAWN IS UNCERTAIN. PLEASE NOTIFY THE MICROBIOLOGY DEPARTMENT WITHIN ONE WEEK IF SPECIATION AND SENSITIVITIES ARE REQUIRED. Performed at [facility], [address], [place], [place] [address]    Report Status 05/07/2023 FINAL  Final  Blood Culture ID Panel (Reflexed)     Status: Abnormal   Collection Time: 05/04/23  1:25 PM  Result Value Ref Range Status   Enterococcus faecalis NOT DETECTED NOT DETECTED Final   Enterococcus Faecium NOT DETECTED NOT DETECTED Final   Listeria monocytogenes NOT DETECTED NOT DETECTED Final   Staphylococcus species DETECTED (A) NOT DETECTED Final    Comment: CRITICAL RESULT CALLED TO, READ BACK BY AND VERIFIED WITH: A SHELTON,RN@0707  05/06/23 MK    Staphylococcus aureus (BCID) NOT DETECTED NOT DETECTED Final   Staphylococcus epidermidis DETECTED (A) NOT DETECTED Final    Comment: CRITICAL RESULT CALLED TO, READ BACK BY AND VERIFIED WITH: A SHELTON,RN@0707  05/06/23 MK    Staphylococcus lugdunensis NOT DETECTED NOT DETECTED Final   Streptococcus species NOT DETECTED NOT DETECTED Final   Streptococcus agalactiae NOT DETECTED NOT DETECTED Final   Streptococcus pneumoniae NOT DETECTED NOT DETECTED Final   Streptococcus pyogenes NOT DETECTED NOT DETECTED Final   A.calcoaceticus-baumannii NOT DETECTED NOT DETECTED Final   Bacteroides fragilis NOT DETECTED NOT DETECTED Final   Enterobacterales NOT DETECTED NOT DETECTED Final   Enterobacter cloacae complex NOT DETECTED NOT DETECTED Final   Escherichia coli NOT DETECTED NOT DETECTED Final   Klebsiella aerogenes NOT DETECTED NOT DETECTED Final   Klebsiella oxytoca NOT DETECTED NOT DETECTED Final    Klebsiella pneumoniae NOT DETECTED NOT DETECTED Final   Proteus species NOT DETECTED NOT DETECTED Final   Salmonella species NOT DETECTED NOT DETECTED Final   Serratia marcescens NOT DETECTED NOT DETECTED Final   Haemophilus influenzae NOT DETECTED NOT DETECTED Final  Neisseria meningitidis NOT DETECTED NOT DETECTED Final   Pseudomonas aeruginosa NOT DETECTED NOT DETECTED Final   Stenotrophomonas maltophilia NOT DETECTED NOT DETECTED Final   Candida albicans NOT DETECTED NOT DETECTED Final   Candida auris NOT DETECTED NOT DETECTED Final   Candida glabrata NOT DETECTED NOT DETECTED Final   Candida krusei NOT DETECTED NOT DETECTED Final   Candida parapsilosis NOT DETECTED NOT DETECTED Final   Candida tropicalis NOT DETECTED NOT DETECTED Final   Cryptococcus neoformans/gattii NOT DETECTED NOT DETECTED Final   Methicillin resistance mecA/C NOT DETECTED NOT DETECTED Final    Comment: Performed at [facility], [address], [place], [place] [address]  MRSA Next Gen by PCR, Nasal     Status: Abnormal   Collection Time: 05/04/23  1:36 PM   Specimen: Nasal Mucosa; Nasal Swab  Result Value Ref Range Status   MRSA by PCR Next Gen DETECTED (A) NOT DETECTED Final    Comment: RESULT CALLED TO, READ BACK BY AND VERIFIED WITH: L IRVING AT 1925 ON [id number] BY S DALTON (NOTE) The GeneXpert MRSA Assay (FDA approved for NASAL specimens only), is one component of a comprehensive MRSA colonization surveillance program. It is not intended to diagnose MRSA infection nor to guide or monitor treatment for MRSA infections. Test performance is not FDA approved in patients less than 25 years old. Performed at [facility], [address] [place], [place] [address]      Radiology Studies: No results found.   Scheduled Meds:  aspirin  EC  81 mg Oral Q breakfast   Chlorhexidine  Gluconate Cloth  6 each Topical Daily   enoxaparin  (LOVENOX ) injection  30 mg Subcutaneous Q24H   feeding supplement  1  Container Oral TID BM   folic acid   1 mg Oral Daily   latanoprost   1 drop Both Eyes QHS   melatonin  3 mg Oral QHS   memantine   5 mg Oral Daily   midodrine   5 mg Oral TID WC   mupirocin  ointment  1 Application Nasal BID   pantoprazole  (PROTONIX ) IV  40 mg Intravenous Q12H   polyethylene glycol  17 g Oral TID   rosuvastatin   5 mg Oral Daily   tamsulosin   0.4 mg Oral QPC supper   Continuous Infusions:  lactated ringers  50 mL/hr at 05/07/23 0844   piperacillin -tazobactam (ZOSYN )  IV 3.375 g (05/07/23 0508)   vancomycin        LOS: 3 days   Critical Care Procedure Note Authorized and Performed by: Olga Berthold MD  Total Critical Care time:  55 mins Due to a high probability of clinically significant, life threatening deterioration, the patient required my highest level of preparedness to intervene emergently and I personally spent this critical care time directly and personally managing the patient.  This critical care time included obtaining a history; examining the patient, pulse oximetry; ordering and review of studies; arranging urgent treatment with development of a management plan; evaluation of patient's response of treatment; frequent reassessment; and discussions with other providers.  This critical care time was performed to assess and manage the high probability of imminent and life threatening deterioration that could result in multi-organ failure.  It was exclusive of separately billable procedures and treating other patients and teaching time.   Bobbetta Burnet, MD How to contact the [facility] Attending or Consulting provider 7A - 7P or covering provider during after hours 7P -7A, for this patient?  Check the care team in [facility] and look for a)  attending/consulting [facility] provider listed and b) the [facility] team listed Log into [email or web address] to find provider on call.  Locate the [facility] provider you are looking for under [facility] and page to a number that you can be directly reached. If  you still have difficulty reaching the provider, please page the [facility] (Director on Call) for the Hospitalists listed on amion for assistance.  05/07/2023, 11:49 AM

## 2023-05-07 NOTE — Plan of Care (Signed)
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
## 2023-05-08 DIAGNOSIS — D649 Anemia, unspecified: Secondary | ICD-10-CM | POA: Diagnosis not present

## 2023-05-08 DIAGNOSIS — K5289 Other specified noninfective gastroenteritis and colitis: Secondary | ICD-10-CM | POA: Diagnosis not present

## 2023-05-08 DIAGNOSIS — R6521 Severe sepsis with septic shock: Secondary | ICD-10-CM | POA: Diagnosis not present

## 2023-05-08 DIAGNOSIS — A419 Sepsis, unspecified organism: Secondary | ICD-10-CM | POA: Diagnosis not present

## 2023-05-08 LAB — GLUCOSE, CAPILLARY: Glucose-Capillary: 88 mg/dL (ref 70–99)

## 2023-05-08 LAB — CBC WITH DIFFERENTIAL/PLATELET
Abs Immature Granulocytes: 0.01 10*3/uL (ref 0.00–0.07)
Basophils Absolute: 0 10*3/uL (ref 0.0–0.1)
Basophils Relative: 1 %
Eosinophils Absolute: 0.2 10*3/uL (ref 0.0–0.5)
Eosinophils Relative: 3 %
HCT: 25.6 % — ABNORMAL LOW (ref 39.0–52.0)
Hemoglobin: 8.2 g/dL — ABNORMAL LOW (ref 13.0–17.0)
Immature Granulocytes: 0 %
Lymphocytes Relative: 16 %
Lymphs Abs: 0.7 10*3/uL (ref 0.7–4.0)
MCH: 34 pg (ref 26.0–34.0)
MCHC: 32 g/dL (ref 30.0–36.0)
MCV: 106.2 fL — ABNORMAL HIGH (ref 80.0–100.0)
Monocytes Absolute: 0.9 10*3/uL (ref 0.1–1.0)
Monocytes Relative: 20 %
Neutro Abs: 2.6 10*3/uL (ref 1.7–7.7)
Neutrophils Relative %: 60 %
Platelets: 97 10*3/uL — ABNORMAL LOW (ref 150–400)
RBC: 2.41 MIL/uL — ABNORMAL LOW (ref 4.22–5.81)
RDW: 24 % — ABNORMAL HIGH (ref 11.5–15.5)
WBC: 4.4 10*3/uL (ref 4.0–10.5)
nRBC: 0.5 % — ABNORMAL HIGH (ref 0.0–0.2)

## 2023-05-08 LAB — BASIC METABOLIC PANEL WITH GFR
Anion gap: 9 (ref 5–15)
BUN: 32 mg/dL — ABNORMAL HIGH (ref 8–23)
CO2: 22 mmol/L (ref 22–32)
Calcium: 8.7 mg/dL — ABNORMAL LOW (ref 8.9–10.3)
Chloride: 111 mmol/L (ref 98–111)
Creatinine, Ser: 1.92 mg/dL — ABNORMAL HIGH (ref 0.61–1.24)
GFR, Estimated: 32 mL/min — ABNORMAL LOW (ref >60)
Glucose, Bld: 87 mg/dL (ref 70–99)
Potassium: 3.3 mmol/L — ABNORMAL LOW (ref 3.5–5.1)
Sodium: 142 mmol/L (ref 135–145)

## 2023-05-08 MED ORDER — FLORANEX PO PACK
1.0000 g | PACK | Freq: Three times a day (TID) | ORAL | Status: DC
  Administered 2023-05-08 – 2023-05-11 (×9): 1 g via ORAL
  Filled 2023-05-08 (×12): qty 1

## 2023-05-08 MED ORDER — MELATONIN 3 MG PO TABS
6.0000 mg | ORAL_TABLET | Freq: Every day | ORAL | Status: DC
  Administered 2023-05-08 – 2023-05-10 (×3): 6 mg via ORAL
  Filled 2023-05-08 (×3): qty 2

## 2023-05-08 MED ORDER — ENOXAPARIN SODIUM 30 MG/0.3ML IJ SOSY
30.0000 mg | PREFILLED_SYRINGE | INTRAMUSCULAR | Status: DC
  Administered 2023-05-09 – 2023-05-11 (×3): 30 mg via SUBCUTANEOUS
  Filled 2023-05-08 (×3): qty 0.3

## 2023-05-08 MED ORDER — HYDRALAZINE HCL 20 MG/ML IJ SOLN
10.0000 mg | INTRAMUSCULAR | Status: DC | PRN
  Administered 2023-05-08: 10 mg via INTRAVENOUS
  Filled 2023-05-08: qty 1

## 2023-05-08 MED ORDER — ASPIRIN 81 MG PO TBEC
81.0000 mg | DELAYED_RELEASE_TABLET | Freq: Every day | ORAL | Status: DC
  Administered 2023-05-09 – 2023-05-12 (×4): 81 mg via ORAL
  Filled 2023-05-08 (×4): qty 1

## 2023-05-08 MED ORDER — MELATONIN 5 MG PO TABS
5.0000 mg | ORAL_TABLET | Freq: Every day | ORAL | Status: DC
  Filled 2023-05-08: qty 1

## 2023-05-08 MED ORDER — MEMANTINE HCL 10 MG PO TABS
5.0000 mg | ORAL_TABLET | Freq: Every day | ORAL | Status: DC
  Administered 2023-05-09 – 2023-05-12 (×4): 5 mg via ORAL
  Filled 2023-05-08 (×6): qty 1

## 2023-05-08 MED ORDER — AMLODIPINE BESYLATE 5 MG PO TABS
5.0000 mg | ORAL_TABLET | Freq: Every day | ORAL | Status: DC
  Administered 2023-05-08: 5 mg via ORAL
  Filled 2023-05-08: qty 1

## 2023-05-08 MED ORDER — AMLODIPINE BESYLATE 5 MG PO TABS
10.0000 mg | ORAL_TABLET | Freq: Every day | ORAL | Status: DC
  Administered 2023-05-09 – 2023-05-12 (×4): 10 mg via ORAL
  Filled 2023-05-08 (×5): qty 2

## 2023-05-08 MED ORDER — CHLORHEXIDINE GLUCONATE CLOTH 2 % EX PADS
6.0000 | MEDICATED_PAD | Freq: Every day | CUTANEOUS | Status: DC
Start: 2023-05-08 — End: 2023-05-12
  Administered 2023-05-09 – 2023-05-12 (×4): 6 via TOPICAL

## 2023-05-08 MED ORDER — PANTOPRAZOLE SODIUM 40 MG PO TBEC
40.0000 mg | DELAYED_RELEASE_TABLET | Freq: Two times a day (BID) | ORAL | Status: DC
  Administered 2023-05-08 – 2023-05-12 (×7): 40 mg via ORAL
  Filled 2023-05-08 (×7): qty 1

## 2023-05-08 MED ORDER — LABETALOL HCL 5 MG/ML IV SOLN: 10.0000 mg | Freq: Four times a day (QID) | INTRAVENOUS | Status: DC | PRN

## 2023-05-08 NOTE — Care Management Important Message (Signed)
 Important Message  Patient Details  Name: Terrance Miller MRN: [id number] Date of Birth: 1928-12-26   Important Message Given:  Yes - Medicare IM     Keandria Berrocal L Honi Name 05/08/2023, 12:51 PM
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
## 2023-05-08 NOTE — Progress Notes (Signed)
 PROGRESS NOTE   Terrance Miller  RUE:[id number] DOB: [birth year] DOA: 05/04/2023 PCP: System, Provider Not In   Chief Complaint  Patient presents with   Nausea   Emesis   Level of care: Stepdown  Brief Admission History:  89 year old male with advanced vascular dementia currently residing at [facility] with past medical history significant for HFpEF, hyperlipidemia, hypertension, chronic thrombocytopenia, MGUS, glaucoma, chronic constipation, CAD status post PCI, type 2 diabetes mellitus, hyperlipidemia, history of MI in 2004, history of fecal impaction removal in January 2024 by Dr. Sammi Crick, BPH with history of hydrocelectomy by Dr. Oda Bence who was recently discharged from this hospital on 04/27/2023 after treatment for Enterobacter UTI, metabolic encephalopathy.  Patient was sent from [facility] today for nausea and vomiting that started around 5 AM.  Emesis was reported to be very dark in color.  Patient denied abdominal pain but felt bloating in the abdomen.  He is currently undergoing therapy for urinary tract infection.  He was ill-appearing with abdominal distention and tenderness.  CT abdomen demonstrating stercoral colitis with fluid-filled small intestine and stomach.  Surgery and GI was consulted and medicine will be admitting for further management in the hospital.   -NG tube placed on Monday and was given milk of molasses enema with numerous bowel movements thereafter - Monday afternoon removed NG tube but did not have any recurrent vomiting -05/05/23 Hypotensive/hypothermic lactic acidosis -was moved to ICU  -Treated for sepsis, septic shock in ICU setting -05/07/2023 -sepsis physiology resolved  - KUB 4/29 with improvement in colonic ileus - Tolerating clear liquid diet since Wednesday,  - Appetite significantly improved t - Last Bms x  2 today  - 05/08/23 Will advance diet to soft today-resolving sepsis physiology, hypertensive now, adding BP meds, transferred  out of ICU    Subjective: The patient was seen and examined this morning, more awake, alert, trying to communicate difficult to understand, comprehension impaired, pleasantly confused, Still critically ill, with poor p.o. intake  Wean off IV norepinephrine  drip BP stabilizing, will taper off midodrine    Assessment and Plan:  Severe Sepsis with septic Shock -  resolving sepsis physiology (resolved hypotension, lactic acidosis, hypothermia) - ff IV norepinephrine  infusion -since morning of 05/07/2023 -Resolved lactic acidosis - IV antibiotics, vancomycin  discontinued, continue Zosyn  - Bactroban -  MRSA screen positive - Blood culture-growing staph epididymis likely contaminant - Recurrent rectal stool impaction/ Stercoral colitis  / Chronic constipation  Ileus from constipation - appreciate GI consultation and recommendations -Recommend continue bowel regimen with MiraLAX  17 g 3 times daily, clear liquid diet, PPI twice daily - S/p molasses enema treatments  - 05/05/2023 NGT placed for decompression to low intermittent suction but patient pulled out overnight --GI and general surgery were following - Good bowel movements now, continue MiraLAX  17 g 3 times daily advancing diet - discussed with GI service, ok to start clears diet and monitor closely     Hypotension -resolved now hypertensive - secondary to severe sepsis with septic shock,  - S/p treatment with  IV norepinephrine  and oral midodrine  5 mg TID  -discontinued --Discontinued IV fluid, tolerating p.o. -   Hypothermia -due to sepsis, septic shock-resolved  Chronic HFpEF - appears compensated currently, monitoring I's and O's, holding diuretics    BPH -with urinary retention - he is having urinary retention likely from obstipation - foley catheter placement ordered and urine output being monitoried - Continue foley for now, DC foley in next 1-2 days if improving   Glaucoma - resumed home  medication  Macrocytic  Anemia    Latest Ref Rng & Units 05/08/2023    4:10 AM 05/07/2023    4:10 AM 05/06/2023    4:43 AM  CBC  WBC 4.0 - 10.5 K/uL 4.4  6.0  6.5   Hemoglobin 13.0 - 17.0 g/dL 8.2  8.3  8.1   Hematocrit 39.0 - 52.0 % 25.6  25.2  24.9   Platelets 150 - 400 K/uL 97  103  99    - Total iron normal at 79, TIBC 226, ferritin 543, B12 normal at 1249, folate normal at 21.0 -Continue to monitor,    DVT prophylaxis: enoxaparin   Code Status: DNR   Family Communication: daughter updated at bedside 05/08/2023  Disposition: TBD but likely to return to LTC at [facility]   Consultants:  GI Procedures:  NG tube placement and removal  Antimicrobials:  Vancomycin /Zosyn      Objective: Vitals:   05/08/23 0900 05/08/23 1000 05/08/23 1100 05/08/23 1142  BP: (!) 141/58 (!) 156/53 (!) 148/58   Pulse: (!) 54 (!) 52 (!) 51   Resp: 15 (!) 9 15   Temp:    98 F (36.7 C)  TempSrc:    Oral  SpO2: 95% 96% 97%   Weight:      Height:        Intake/Output Summary (Last 24 hours) at 05/08/2023 1415 Last data filed at 05/08/2023 0839 Gross per 24 hour  Intake 1419.14 ml  Output 700 ml  Net 719.14 ml   Filed Weights   05/04/23 0652 05/04/23 1400  Weight: 92.8 kg 82.5 kg   Examination:      General:  AAO x 1,  cooperative, no distress;   HEENT:  Normocephalic, PERRL, otherwise with in Normal limits   Neuro:  CNII-XII intact. , normal motor and sensation, reflexes intact   Lungs:   Clear to auscultation BL, Respirations unlabored,  No wheezes / crackles  Cardio:    S1/S2, RRR, No murmure, No Rubs or Gallops   Abdomen:  Soft, non-tender, bowel sounds active all four quadrants, no guarding or peritoneal signs.  Muscular  skeletal:  Limited exam -global generalized weaknesses - in bed, able to move all 4 extremities,   2+ pulses,  symmetric, No pitting edema  Skin:  Dry, warm to touch, negative for any Rashes,  Wounds: Please see nursing documentation  Pressure Injury 01/28/22 Scrotum  Left;Right;Posterior Stage 2 -  Partial thickness loss of dermis presenting as a shallow open injury with a red, pink wound bed without slough. Partial thickness (Active)  01/28/22 0040  Location: Scrotum  Location Orientation: Left;Right;Posterior  Staging: Stage 2 -  Partial thickness loss of dermis presenting as a shallow open injury with a red, pink wound bed without slough.  Wound Description (Comments): Partial thickness  Present on Admission: Yes  Dressing Type None 05/08/23 0800              Data Reviewed: I have personally reviewed following labs and imaging studies  CBC: Recent Labs  Lab 05/04/23 0708 05/04/23 1522 05/05/23 0201 05/06/23 0443 05/07/23 0410 05/08/23 0410  WBC 7.0  --  16.3* 6.5 6.0 4.4  NEUTROABS 5.6  --  14.5* 4.4 4.4 2.6  HGB 11.3* 10.5* 9.6* 8.1* 8.3* 8.2*  HCT 34.1* 32.4* 29.6* 24.9* 25.2* 25.6*  MCV 105.6*  --  107.6* 106.9* 106.3* 106.2*  PLT 146*  --  126* 99* 103* 97*    Basic Metabolic Panel: Recent Labs  Lab 05/04/23  1610 05/05/23 0201 05/06/23 0443 05/07/23 0410 05/08/23 0410  NA 138 141 138 140 142  K 4.4 4.2 3.8 3.5 3.3*  CL 106 110 110 109 111  CO2 22 21* 21* 22 22  GLUCOSE 101* 80 75 81 87  BUN 39* 49* 43* 38* 32*  CREATININE 1.63* 2.02* 2.24* 2.08* 1.92*  CALCIUM  9.4 8.5* 8.5* 8.6* 8.7*  MG  --  2.0  --  2.0  --     CBG: Recent Labs  Lab 05/06/23 0013 05/06/23 0607 05/07/23 1646 05/07/23 2135 05/08/23 0504  GLUCAP 74 72 99 85 88    Recent Results (from the past 240 hours)  Culture, blood (Routine X 2) w Reflex to ID Panel     Status: None (Preliminary result)   Collection Time: 05/04/23  1:09 PM   Specimen: BLOOD  Result Value Ref Range Status   Specimen Description BLOOD BLOOD LEFT HAND  Final   Special Requests NONE  Final   Culture   Final    NO GROWTH 4 DAYS Performed at [facility], [address] [place], [place] [address]    Report Status PENDING  Incomplete  Culture, blood (Routine X 2) w  Reflex to ID Panel     Status: Abnormal   Collection Time: 05/04/23  1:25 PM   Specimen: BLOOD  Result Value Ref Range Status   Specimen Description   Final    BLOOD BLOOD RIGHT HAND Performed at [facility], [address] [place], [place] [address]    Special Requests   Final    BOTTLES DRAWN AEROBIC AND ANAEROBIC Blood Culture results may not be optimal due to an inadequate volume of blood received in culture bottles Performed at [facility], [address], [place], [place] [address]    Culture  Setup Time   Final    GRAM POSITIVE COCCI AEROBIC BOTTLE ONLY Gram Stain Report Called to,Read Back By and Verified With: S. Judene Noss, RN AT 2227 05/05/23 BY A. SNYDER CRITICAL RESULT CALLED TO, READ BACK BY AND VERIFIED WITH: A SHELTON,RN@0705  05/06/23 MK    Culture (A)  Final    STAPHYLOCOCCUS EPIDERMIDIS THE SIGNIFICANCE OF ISOLATING THIS ORGANISM FROM A SINGLE SET OF BLOOD CULTURES WHEN MULTIPLE SETS ARE DRAWN IS UNCERTAIN. PLEASE NOTIFY THE MICROBIOLOGY DEPARTMENT WITHIN ONE WEEK IF SPECIATION AND SENSITIVITIES ARE REQUIRED. Performed at [facility], [address], [place], [place] [address]    Report Status 05/07/2023 FINAL  Final  Blood Culture ID Panel (Reflexed)     Status: Abnormal   Collection Time: 05/04/23  1:25 PM  Result Value Ref Range Status   Enterococcus faecalis NOT DETECTED NOT DETECTED Final   Enterococcus Faecium NOT DETECTED NOT DETECTED Final   Listeria monocytogenes NOT DETECTED NOT DETECTED Final   Staphylococcus species DETECTED (A) NOT DETECTED Final    Comment: CRITICAL RESULT CALLED TO, READ BACK BY AND VERIFIED WITH: A SHELTON,RN@0707  05/06/23 MK    Staphylococcus aureus (BCID) NOT DETECTED NOT DETECTED Final   Staphylococcus epidermidis DETECTED (A) NOT DETECTED Final    Comment: CRITICAL RESULT CALLED TO, READ BACK BY AND VERIFIED WITH: A SHELTON,RN@0707  05/06/23 MK    Staphylococcus lugdunensis NOT DETECTED NOT DETECTED Final    Streptococcus species NOT DETECTED NOT DETECTED Final   Streptococcus agalactiae NOT DETECTED NOT DETECTED Final   Streptococcus pneumoniae NOT DETECTED NOT DETECTED Final   Streptococcus pyogenes NOT DETECTED NOT DETECTED Final   A.calcoaceticus-baumannii NOT DETECTED NOT DETECTED Final  Bacteroides fragilis NOT DETECTED NOT DETECTED Final   Enterobacterales NOT DETECTED NOT DETECTED Final   Enterobacter cloacae complex NOT DETECTED NOT DETECTED Final   Escherichia coli NOT DETECTED NOT DETECTED Final   Klebsiella aerogenes NOT DETECTED NOT DETECTED Final   Klebsiella oxytoca NOT DETECTED NOT DETECTED Final   Klebsiella pneumoniae NOT DETECTED NOT DETECTED Final   Proteus species NOT DETECTED NOT DETECTED Final   Salmonella species NOT DETECTED NOT DETECTED Final   Serratia marcescens NOT DETECTED NOT DETECTED Final   Haemophilus influenzae NOT DETECTED NOT DETECTED Final   Neisseria meningitidis NOT DETECTED NOT DETECTED Final   Pseudomonas aeruginosa NOT DETECTED NOT DETECTED Final   Stenotrophomonas maltophilia NOT DETECTED NOT DETECTED Final   Candida albicans NOT DETECTED NOT DETECTED Final   Candida auris NOT DETECTED NOT DETECTED Final   Candida glabrata NOT DETECTED NOT DETECTED Final   Candida krusei NOT DETECTED NOT DETECTED Final   Candida parapsilosis NOT DETECTED NOT DETECTED Final   Candida tropicalis NOT DETECTED NOT DETECTED Final   Cryptococcus neoformans/gattii NOT DETECTED NOT DETECTED Final   Methicillin resistance mecA/C NOT DETECTED NOT DETECTED Final    Comment: Performed at [facility], [address], [place], [place] [address]  MRSA Next Gen by PCR, Nasal     Status: Abnormal   Collection Time: 05/04/23  1:36 PM   Specimen: Nasal Mucosa; Nasal Swab  Result Value Ref Range Status   MRSA by PCR Next Gen DETECTED (A) NOT DETECTED Final    Comment: RESULT CALLED TO, READ BACK BY AND VERIFIED WITH: L IRVING AT 1925 ON [id number] BY S DALTON (NOTE) The  GeneXpert MRSA Assay (FDA approved for NASAL specimens only), is one component of a comprehensive MRSA colonization surveillance program. It is not intended to diagnose MRSA infection nor to guide or monitor treatment for MRSA infections. Test performance is not FDA approved in patients less than 39 years old. Performed at [facility], [address], [place], [place] [address]      Radiology Studies: No results found.   Scheduled Meds:  amLODipine   5 mg Oral Daily   [START ON 05/09/2023] aspirin  EC  81 mg Oral Q breakfast   Chlorhexidine  Gluconate Cloth  6 each Topical Daily   [START ON 05/09/2023] enoxaparin  (LOVENOX ) injection  30 mg Subcutaneous Q24H   feeding supplement  1 Container Oral TID BM   folic acid   1 mg Oral Daily   lactobacillus  1 g Oral TID WC   latanoprost   1 drop Both Eyes QHS   melatonin  6 mg Oral QHS   [START ON 05/09/2023] memantine   5 mg Oral Daily   mupirocin  ointment  1 Application Nasal BID   pantoprazole  (PROTONIX ) IV  40 mg Intravenous Q12H   polyethylene glycol  17 g Oral TID   rosuvastatin   5 mg Oral Daily   tamsulosin   0.4 mg Oral QPC supper   Continuous Infusions:  piperacillin -tazobactam (ZOSYN )  IV 3.375 g (05/08/23 1401)     LOS: 4 days   Critical Care Procedure Note Total Critical Care time:  55 mins  Bobbetta Burnet, MD How to contact the [facility] Attending or Consulting provider 7A - 7P or covering provider during after hours 7P -7A, for this patient?  Check the care team in [facility] and look for a) attending/consulting [facility] provider listed and b) the [facility] team listed Log into [email or web address] to find provider on call.  Locate the [facility] provider you are  looking for under [facility] and page to a number that you can be directly reached. If you still have difficulty reaching the provider, please page the [facility] (Director on Call) for the Hospitalists listed on amion for assistance.  05/08/2023, 2:15 PM

## 2023-05-08 NOTE — Plan of Care (Signed)

## 2023-05-09 ENCOUNTER — Telehealth (INDEPENDENT_AMBULATORY_CARE_PROVIDER_SITE_OTHER): Payer: Self-pay | Admitting: Gastroenterology

## 2023-05-09 DIAGNOSIS — N1832 Chronic kidney disease, stage 3b: Secondary | ICD-10-CM | POA: Diagnosis not present

## 2023-05-09 DIAGNOSIS — I5032 Chronic diastolic (congestive) heart failure: Secondary | ICD-10-CM | POA: Diagnosis not present

## 2023-05-09 DIAGNOSIS — D649 Anemia, unspecified: Secondary | ICD-10-CM | POA: Diagnosis not present

## 2023-05-09 DIAGNOSIS — A419 Sepsis, unspecified organism: Secondary | ICD-10-CM | POA: Diagnosis not present

## 2023-05-09 DIAGNOSIS — K5289 Other specified noninfective gastroenteritis and colitis: Secondary | ICD-10-CM | POA: Diagnosis not present

## 2023-05-09 DIAGNOSIS — F0150 Vascular dementia without behavioral disturbance: Secondary | ICD-10-CM | POA: Diagnosis not present

## 2023-05-09 LAB — BASIC METABOLIC PANEL WITH GFR
Anion gap: 9 (ref 5–15)
BUN: 26 mg/dL — ABNORMAL HIGH (ref 8–23)
CO2: 20 mmol/L — ABNORMAL LOW (ref 22–32)
Calcium: 8.6 mg/dL — ABNORMAL LOW (ref 8.9–10.3)
Chloride: 111 mmol/L (ref 98–111)
Creatinine, Ser: 1.84 mg/dL — ABNORMAL HIGH (ref 0.61–1.24)
GFR, Estimated: 34 mL/min — ABNORMAL LOW (ref >60)
Glucose, Bld: 94 mg/dL (ref 70–99)
Potassium: 3.1 mmol/L — ABNORMAL LOW (ref 3.5–5.1)
Sodium: 140 mmol/L (ref 135–145)

## 2023-05-09 LAB — CULTURE, BLOOD (ROUTINE X 2): Culture: NO GROWTH

## 2023-05-09 MED ORDER — AMOXICILLIN-POT CLAVULANATE 875-125 MG PO TABS: 1.0000 | ORAL_TABLET | Freq: Two times a day (BID) | ORAL | Status: DC

## 2023-05-09 MED ORDER — POTASSIUM CHLORIDE CRYS ER 20 MEQ PO TBCR
40.0000 meq | EXTENDED_RELEASE_TABLET | Freq: Once | ORAL | Status: AC
  Administered 2023-05-09: 40 meq via ORAL
  Filled 2023-05-09: qty 4

## 2023-05-09 MED ORDER — BENZONATATE 100 MG PO CAPS
100.0000 mg | ORAL_CAPSULE | Freq: Three times a day (TID) | ORAL | Status: DC
  Administered 2023-05-09 – 2023-05-11 (×6): 100 mg via ORAL
  Filled 2023-05-09 (×6): qty 1

## 2023-05-09 MED ORDER — AMOXICILLIN-POT CLAVULANATE 875-125 MG PO TABS
1.0000 | ORAL_TABLET | Freq: Two times a day (BID) | ORAL | Status: AC
  Administered 2023-05-09 – 2023-05-11 (×4): 1 via ORAL
  Filled 2023-05-09 (×4): qty 1

## 2023-05-09 NOTE — Plan of Care (Signed)
  Problem: Activity: Goal: Risk for activity intolerance will decrease Outcome: Not Progressing   Problem: Nutrition: Goal: Adequate nutrition will be maintained Outcome: Not Progressing   Problem: Elimination: Goal: Will not experience complications related to bowel motility Outcome: Progressing Goal: Will not experience complications related to urinary retention Outcome: Progressing   Problem: Pain Managment: Goal: General experience of comfort will improve and/or be controlled Outcome: Progressing

## 2023-05-09 NOTE — Progress Notes (Signed)
 Assumed care of patient @ 1500. Patient frequently coughing up yellow mucous. Coughing a lot with dinner tray & need meds in pudding. May need to be reevaluated by speech.

## 2023-05-09 NOTE — NC FL2 (Signed)
 [place]  MEDICAID FL2 LEVEL OF CARE FORM     IDENTIFICATION  Patient Name: Terrance Miller Birthdate: [birth year] Sex: male Admission Date (Current Location): 05/04/2023  [place] and [place] Number:  [name] [id number] S Facility and Address:  [facility],  [address], [name] [address]      Provider Number: [id number]  Attending Physician Name and Address:  Demaris Fillers, MD  Relative Name and Phone Number:       Current Level of Care: Hospital Recommended Level of Care: Nursing Facility Prior Approval Number:    Date Approved/Denied:   PASRR Number: [id number]  Discharge Plan: SNF    Current Diagnoses: Patient Active Problem List   Diagnosis Date Noted   Stercoral colitis 05/04/2023   (HFpEF) heart failure with preserved ejection fraction (HCC) 05/04/2023   Nausea with vomiting 05/04/2023   DNR (do not resuscitate) 05/04/2023   FTT (failure to thrive) in adult 05/04/2023   UTI (urinary tract infection) 04/21/2023   Thrombocytopenia (HCC) 04/21/2023   Acute on chronic diastolic CHF (congestive heart failure) (HCC) 04/21/2023   Obesity, Class III, BMI 40-49.9 (morbid obesity) 04/21/2023   Injury to scrotum, penis, or foreskin 03/18/2023   Type 2 diabetes mellitus with chronic kidney disease, with long-term current use of insulin  (HCC) 12/20/2022   Glaucoma 12/20/2022   Vascular dementia without behavioral disturbance (HCC) 08/15/2022   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   Aortic atherosclerosis (HCC) 08/05/2022   Chronic constipation 08/05/2022   Increased intraocular pressure, bilateral 08/05/2022   Edema, peripheral 08/05/2022   Aortic regurgitation 03/17/2022   Pulmonary HTN (HCC) 03/17/2022   Dry skin dermatitis 02/05/2022   Stage 3b chronic kidney disease (HCC) 11/05/2021   MGUS (monoclonal gammopathy of unknown significance) 11/05/2021   Acute metabolic encephalopathy 07/27/2021   Macrocytic anemia 05/31/2021    History of MI (myocardial infarction) 04/29/2021   Hypothermia 05/20/2017   Severe sepsis with septic shock (HCC) 05/20/2017   Essential hypertension 09/28/2013   Hyperlipidemia LDL goal <70 09/28/2013   CAD (coronary artery disease) 09/28/2013    Orientation RESPIRATION BLADDER Height & Weight     Self, Place, Time  Normal Indwelling catheter Weight: 89.8 kg Height:  5\' 6"  (167.6 cm)  BEHAVIORAL SYMPTOMS/MOOD NEUROLOGICAL BOWEL NUTRITION STATUS      Incontinent Diet (See d/c summary)  AMBULATORY STATUS COMMUNICATION OF NEEDS Skin   Extensive Assist Verbally Bruising, Other (Comment) (Stage 2 to scrotum with foam dressing.)                       Personal Care Assistance Level of Assistance  Bathing, Feeding, Dressing Bathing Assistance: Maximum assistance Feeding assistance: Limited assistance Dressing Assistance: Maximum assistance     Functional Limitations Info  Sight, Hearing Sight Info: Impaired Hearing Info: Impaired Speech Info: Adequate    SPECIAL CARE FACTORS FREQUENCY  PT (By licensed PT)     PT Frequency: 5x weekly              Contractures      Additional Factors Info  Code Status, Allergies Code Status Info: DNR-Limited Allergies Info: Clobetasol            Current Medications (05/09/2023):  This is the current hospital active medication list Current Facility-Administered Medications  Medication Dose Route Frequency Provider Last Rate Last Admin   acetaminophen  (TYLENOL ) tablet 650 mg  650 mg Oral Q6H PRN Shahmehdi, Seyed A, MD       Or   acetaminophen  (TYLENOL ) suppository  650 mg  650 mg Rectal Q6H PRN Shahmehdi, Seyed A, MD       amLODipine  (NORVASC ) tablet 10 mg  10 mg Oral Daily Shahmehdi, Seyed A, MD   10 mg at 05/09/23 1123   amoxicillin-clavulanate (AUGMENTIN) 875-125 MG per tablet 1 tablet  1 tablet Oral Q12H Demaris Fillers, MD       aspirin  EC tablet 81 mg  81 mg Oral Q breakfast Shahmehdi, Seyed A, MD   81 mg at 05/09/23 0830    benzonatate (TESSALON) capsule 100 mg  100 mg Oral TID Tat, Myrtie Atkinson, MD       Chlorhexidine  Gluconate Cloth 2 % PADS 6 each  6 each Topical Daily Shahmehdi, Seyed A, MD   6 each at 05/09/23 1124   enoxaparin  (LOVENOX ) injection 30 mg  30 mg Subcutaneous Q24H Shahmehdi, Seyed A, MD       feeding supplement (BOOST / RESOURCE BREEZE) liquid 1 Container  1 Container Oral TID BM Shahmehdi, Seyed A, MD   1 Container at 05/09/23 1124   fentaNYL  (SUBLIMAZE ) injection 12.5-25 mcg  12.5-25 mcg Intravenous Q2H PRN Shahmehdi, Constantino Demark, MD       folic acid  (FOLVITE ) tablet 1 mg  1 mg Oral Daily Shahmehdi, Seyed A, MD   1 mg at 05/09/23 1123   lactobacillus (FLORANEX/LACTINEX) granules 1 g  1 g Oral TID WC Shahmehdi, Seyed A, MD   1 g at 05/09/23 1123   latanoprost  (XALATAN ) 0.005 % ophthalmic solution 1 drop  1 drop Both Eyes QHS Shahmehdi, Seyed A, MD   1 drop at 05/08/23 2206   melatonin tablet 6 mg  6 mg Oral QHS Shahmehdi, Seyed A, MD   6 mg at 05/08/23 2205   memantine  (NAMENDA ) tablet 5 mg  5 mg Oral Daily Shahmehdi, Seyed A, MD   5 mg at 05/09/23 1240   ondansetron  (ZOFRAN ) tablet 4 mg  4 mg Oral Q6H PRN Shahmehdi, Seyed A, MD       Or   ondansetron  (ZOFRAN ) injection 4 mg  4 mg Intravenous Q6H PRN Shahmehdi, Seyed A, MD       pantoprazole  (PROTONIX ) EC tablet 40 mg  40 mg Oral BID Shahmehdi, Seyed A, MD   40 mg at 05/09/23 1123   polyethylene glycol (MIRALAX  / GLYCOLAX ) packet 17 g  17 g Oral TID Shahmehdi, Seyed A, MD   17 g at 05/09/23 1123   prochlorperazine  (COMPAZINE ) injection 10 mg  10 mg Intravenous Q4H PRN Shahmehdi, Seyed A, MD       rosuvastatin  (CRESTOR ) tablet 5 mg  5 mg Oral Daily Shahmehdi, Seyed A, MD   5 mg at 05/09/23 1123   tamsulosin  (FLOMAX ) capsule 0.4 mg  0.4 mg Oral QPC supper Shahmehdi, Seyed A, MD   0.4 mg at 05/08/23 1647     Discharge Medications: Allergies as of 05/09/2023       Reactions   Clobetasol  Other (See Comments)   Unknown  No reaction listed on MAR         Medication List     STOP taking these medications    ciprofloxacin  250 MG tablet Commonly known as: CIPRO    tuberculin 5 UNIT/0.1ML injection       TAKE these medications    acetaminophen  325 MG tablet Commonly known as: TYLENOL  Take 2 tablets (650 mg total) by mouth every 6 (six) hours as needed for mild pain (pain score 1-3) (or Fever >/= 101). What changed: additional instructions  albuterol  (2.5 MG/3ML) 0.083% nebulizer solution Commonly known as: PROVENTIL  Take 2.5 mg by nebulization every 6 (six) hours as needed for wheezing or shortness of breath.   amLODipine  10 MG tablet Commonly known as: NORVASC  Take 1 tablet (10 mg total) by mouth daily.   amoxicillin-clavulanate 875-125 MG tablet Commonly known as: AUGMENTIN Take 1 tablet by mouth every 12 (twelve) hours. X 2 days   aspirin  EC 81 MG tablet Take 1 tablet (81 mg total) by mouth daily with breakfast. Swallow whole.   bisacodyl  10 MG suppository Commonly known as: DULCOLAX Place 1 suppository (10 mg total) rectally every Monday, Wednesday, and Friday. What changed:  when to take this additional instructions   carvedilol  3.125 MG tablet Commonly known as: COREG  Take 1 tablet (3.125 mg total) by mouth 2 (two) times daily.   cholecalciferol  1000 units tablet Commonly known as: VITAMIN D  Take 1,000 Units by mouth daily.   cyanocobalamin  1000 MCG tablet Commonly known as: VITAMIN B12 Take 1,000 mcg by mouth daily.   folic acid  1 MG tablet Commonly known as: FOLVITE  Take 1 tablet (1 mg total) by mouth daily.   furosemide  20 MG tablet Commonly known as: Lasix  Take 1 tablet (20 mg total) by mouth daily.   latanoprost  0.005 % ophthalmic solution Commonly known as: XALATAN  Place 1 drop into both eyes 2 (two) times daily.   melatonin 3 MG Tabs tablet Take 3 mg by mouth at bedtime.   memantine  5 MG tablet Commonly known as: NAMENDA  Take 5 mg by mouth daily.   polyethylene glycol 17 g  packet Commonly known as: MIRALAX  / GLYCOLAX  Take 17 g by mouth 2 (two) times daily.   rosuvastatin  5 MG tablet Commonly known as: CRESTOR  Take 5 mg by mouth daily.   senna 8.6 MG Tabs tablet Commonly known as: SENOKOT Take 2 tablets (17.2 mg total) by mouth at bedtime.   tamsulosin  0.4 MG Caps capsule Commonly known as: FLOMAX  Take 1 capsule (0.4 mg total) by mouth daily.         Relevant Imaging Results:  Relevant Lab Results:   Additional Information SS# [id number]  Lynda Sands, RN

## 2023-05-09 NOTE — Telephone Encounter (Signed)
 Hi Darl [place],  Can you please schedule a follow up appointment for this patient in 3-4 weeks with me or any of the APPs?  Thanks,  Katrinka Blazing, MD Gastroenterology and Hepatology [facility] Gastroenterology

## 2023-05-09 NOTE — Discharge Summary (Addendum)
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 3.1*  CL 110 110 109 111 111  CO2 21* 21* 22 22 20*  GLUCOSE 80 75 81 87 94  BUN 49* 43* 38* 32* 26*  CREATININE 2.02* 2.24* 2.08* 1.92* 1.84*  CALCIUM  8.5* 8.5* 8.6* 8.7* 8.6*  MG 2.0  --  2.0  --   --    Liver Function Tests: Recent Labs  Lab 05/04/23 0708 05/05/23 0201 05/06/23 0443  AST 41 31 27  ALT 29 21 18   ALKPHOS 85 65 60  BILITOT 1.7* 2.3* 2.4*  PROT 8.1 6.7 6.2*  ALBUMIN 3.7 3.1* 2.8*   CBG: Recent Labs  Lab 05/06/23 0013 05/06/23 0607 05/07/23 1646 05/07/23 2135 05/08/23 0504  GLUCAP 74 72 99 85 88    Discharge time spent: greater than 30 minutes.  Signed: Demaris Fillers, MD [facility] 05/09/2023

## 2023-05-09 NOTE — Progress Notes (Signed)
 Terrance Miller, M.D. Gastroenterology & Hepatology   Interval History:  No acute events overnight. Patient had a few bowel movements yesterday, denied having any abdominal pain, nausea, vomiting, fever or chills.  Abdomen feels less distended and denies any abdominal pain. Hemoglobin stable at 8.2.  Inpatient Medications:  Current Facility-Administered Medications:    acetaminophen  (TYLENOL ) tablet 650 mg, 650 mg, Oral, Q6H PRN **OR** acetaminophen  (TYLENOL ) suppository 650 mg, 650 mg, Rectal, Q6H PRN, Shahmehdi, Seyed A, MD   amLODipine  (NORVASC ) tablet 10 mg, 10 mg, Oral, Daily, Shahmehdi, Seyed A, MD   aspirin  EC tablet 81 mg, 81 mg, Oral, Q breakfast, Shahmehdi, Seyed A, MD, 81 mg at 05/09/23 0830   Chlorhexidine  Gluconate Cloth 2 % PADS 6 each, 6 each, Topical, Daily, Shahmehdi, Seyed A, MD   enoxaparin  (LOVENOX ) injection 30 mg, 30 mg, Subcutaneous, Q24H, Shahmehdi, Seyed A, MD   feeding supplement (BOOST / RESOURCE BREEZE) liquid 1 Container, 1 Container, Oral, TID BM, Shahmehdi, Seyed A, MD, 1 Container at 05/07/23 2126   fentaNYL  (SUBLIMAZE ) injection 12.5-25 mcg, 12.5-25 mcg, Intravenous, Q2H PRN, Shahmehdi, Seyed A, MD   folic acid  (FOLVITE ) tablet 1 mg, 1 mg, Oral, Daily, Shahmehdi, Seyed A, MD, 1 mg at 05/08/23 1610   hydrALAZINE  (APRESOLINE ) injection 10 mg, 10 mg, Intravenous, Q4H PRN, Shahmehdi, Seyed A, MD, 10 mg at 05/08/23 1831   labetalol (NORMODYNE) injection 10 mg, 10 mg, Intravenous, Q6H PRN, Shahmehdi, Seyed A, MD   lactobacillus (FLORANEX/LACTINEX) granules 1 g, 1 g, Oral, TID WC, Shahmehdi, Seyed A, MD, 1 g at 05/09/23 0830   latanoprost  (XALATAN ) 0.005 % ophthalmic solution 1 drop, 1 drop, Both Eyes, QHS, Shahmehdi, Seyed A, MD, 1 drop at 05/08/23 2206   melatonin tablet 6 mg, 6 mg, Oral, QHS, Shahmehdi, Seyed A, MD, 6 mg at 05/08/23 2205   memantine  (NAMENDA ) tablet 5 mg, 5 mg, Oral, Daily, Shahmehdi, Seyed A, MD   mupirocin  ointment (BACTROBAN ) 2 % 1  Application, 1 Application, Nasal, BID, Shahmehdi, Seyed A, MD, 1 Application at 05/08/23 2206   ondansetron  (ZOFRAN ) tablet 4 mg, 4 mg, Oral, Q6H PRN **OR** ondansetron  (ZOFRAN ) injection 4 mg, 4 mg, Intravenous, Q6H PRN, Shahmehdi, Seyed A, MD   pantoprazole  (PROTONIX ) EC tablet 40 mg, 40 mg, Oral, BID, Shahmehdi, Seyed A, MD, 40 mg at 05/08/23 2205   piperacillin -tazobactam (ZOSYN ) IVPB 3.375 g, 3.375 g, Intravenous, Q8H, Shahmehdi, Seyed A, MD, Last Rate: 12.5 mL/hr at 05/09/23 0517, 3.375 g at 05/09/23 0517   polyethylene glycol (MIRALAX  / GLYCOLAX ) packet 17 g, 17 g, Oral, TID, Shahmehdi, Seyed A, MD, 17 g at 05/08/23 2205   potassium chloride  SA (KLOR-CON  M) CR tablet 40 mEq, 40 mEq, Oral, Once, Tat, David, MD   prochlorperazine  (COMPAZINE ) injection 10 mg, 10 mg, Intravenous, Q4H PRN, Shahmehdi, Seyed A, MD   rosuvastatin  (CRESTOR ) tablet 5 mg, 5 mg, Oral, Daily, Shahmehdi, Seyed A, MD, 5 mg at 05/08/23 9604   tamsulosin  (FLOMAX ) capsule 0.4 mg, 0.4 mg, Oral, QPC supper, Shahmehdi, Seyed A, MD, 0.4 mg at 05/08/23 1647   I/O    Intake/Output Summary (Last 24 hours) at 05/09/2023 0952 Last data filed at 05/09/2023 0900 Gross per 24 hour  Intake 540 ml  Output 650 ml  Net -110 ml     Physical Exam: Temp:  [97.5 F (36.4 C)-98 F (36.7 C)] 97.8 F (36.6 C) (05/03 0919) Pulse Rate:  [50-57] 56 (05/03 0919) Resp:  [9-21] 21 (05/03 0919) BP: (129-205)/(51-75) 130/69 (05/03 0919) SpO2:  [  94 %-99 %] 99 % (05/03 0919) Weight:  [89.8 kg] 89.8 kg (05/02 2100)  Temp (24hrs), Avg:97.7 F (36.5 C), Min:97.5 F (36.4 C), Max:98 F (36.7 C)  GENERAL: The patient is Awake but not oritented, in no acute distress. HEENT: Head is normocephalic and atraumatic. EOMI are intact. Mouth is well hydrated and without lesions. NECK: Supple. No masses LUNGS: Clear to auscultation. No presence of rhonchi/wheezing/rales. Adequate chest expansion HEART: RRR, normal s1 and s2. ABDOMEN: Soft, nontender, no  guarding, no peritoneal signs, and nondistended. BS +. No masses. EXTREMITIES: Without any cyanosis, clubbing, rash, lesions or edema. NEUROLOGIC: Awake, no focal motor deficit. SKIN: no jaundice, no rashes  Laboratory Data: CBC:     Component Value Date/Time   WBC 4.4 05/08/2023 0410   RBC 2.41 (L) 05/08/2023 0410   HGB 8.2 (L) 05/08/2023 0410   HGB 9.7 (L) 03/17/2023 1358   HCT 25.6 (L) 05/08/2023 0410   HCT 28.8 (L) 03/17/2023 1358   PLT 97 (L) 05/08/2023 0410   PLT 166 03/17/2023 1358   MCV 106.2 (H) 05/08/2023 0410   MCV 102 (H) 03/17/2023 1358   MCH 34.0 05/08/2023 0410   MCHC 32.0 05/08/2023 0410   RDW 24.0 (H) 05/08/2023 0410   RDW 22.0 (H) 03/17/2023 1358   LYMPHSABS 0.7 05/08/2023 0410   MONOABS 0.9 05/08/2023 0410   EOSABS 0.2 05/08/2023 0410   BASOSABS 0.0 05/08/2023 0410   COAG:  Lab Results  Component Value Date   INR 1.2 07/29/2022   INR 1.1 01/27/2022   INR 1.5 (H) 11/30/2021    BMP:     Latest Ref Rng & Units 05/09/2023    5:17 AM 05/08/2023    4:10 AM 05/07/2023    4:10 AM  BMP  Glucose 70 - 99 mg/dL 94  87  81   BUN 8 - 23 mg/dL 26  32  38   Creatinine 0.61 - 1.24 mg/dL 3.47  4.25  9.56   Sodium 135 - 145 mmol/L 140  142  140   Potassium 3.5 - 5.1 mmol/L 3.1  3.3  3.5   Chloride 98 - 111 mmol/L 111  111  109   CO2 22 - 32 mmol/L 20  22  22    Calcium  8.9 - 10.3 mg/dL 8.6  8.7  8.6     HEPATIC:     Latest Ref Rng & Units 05/06/2023    4:43 AM 05/05/2023    2:01 AM 05/04/2023    7:08 AM  Hepatic Function  Total Protein 6.5 - 8.1 g/dL 6.2  6.7  8.1   Albumin 3.5 - 5.0 g/dL 2.8  3.1  3.7   AST 15 - 41 U/L 27  31  41   ALT 0 - 44 U/L 18  21  29    Alk Phosphatase 38 - 126 U/L 60  65  85   Total Bilirubin 0.0 - 1.2 mg/dL 2.4  2.3  1.7     CARDIAC: No results found for: "CKTOTAL", "CKMB", "CKMBINDEX", "TROPONINI"    Imaging: I personally reviewed and interpreted the available labs, imaging and endoscopic files.   Assessment/Plan 89 y.o. 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  CT showing recurrent stercoral colitis and likely ileus in setting of constipation.  Patient had improvement of his bowel habits after receiving milk of molasses.  He was also started on MiraLAX  3 times daily which allowed for adequate improvement in his bowel frequency.  Has been having regular bowel movements since then and has been tolerating soft diet which he should continue for now.  He will need to be discharged on MiraLAX  2-3 times daily to avoid recurrent episodes of constipation.  May need to replete electrolytes aggressively to avoid further issues with ileus in the future.  Anemia was present since admission and had presented drop in hemoglobin down to the low eights but no presence of upper gastrointestinal bleeding recently.  As hemoglobin has been stable we will monitor for now given his age and the likelihood that this is related to inflammation due to stercoral colitis.  Continue MiraLAX  3 times daily, will discharge on at least twice daily and give 3 times daily if needed Continue soft diet Continue PPI twice daily Patient will follow up in GI clinic with Dr. Sammi Crick in 3-4 weeks. GI service will sign-off, please call us  back if you have any more questions.   Terrance Cress, MD Gastroenterology and Hepatology [facility]enter[facility]

## 2023-05-09 NOTE — Progress Notes (Signed)
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   LOS: 5 days

## 2023-05-10 ENCOUNTER — Inpatient Hospital Stay (HOSPITAL_COMMUNITY)

## 2023-05-10 DIAGNOSIS — F0150 Vascular dementia without behavioral disturbance: Secondary | ICD-10-CM | POA: Diagnosis not present

## 2023-05-10 DIAGNOSIS — I5033 Acute on chronic diastolic (congestive) heart failure: Secondary | ICD-10-CM | POA: Insufficient documentation

## 2023-05-10 DIAGNOSIS — A419 Sepsis, unspecified organism: Secondary | ICD-10-CM | POA: Diagnosis not present

## 2023-05-10 DIAGNOSIS — K5289 Other specified noninfective gastroenteritis and colitis: Secondary | ICD-10-CM | POA: Diagnosis not present

## 2023-05-10 DIAGNOSIS — D696 Thrombocytopenia, unspecified: Secondary | ICD-10-CM

## 2023-05-10 DIAGNOSIS — D472 Monoclonal gammopathy: Secondary | ICD-10-CM | POA: Diagnosis not present

## 2023-05-10 LAB — BASIC METABOLIC PANEL WITH GFR
Anion gap: 8 (ref 5–15)
BUN: 22 mg/dL (ref 8–23)
CO2: 20 mmol/L — ABNORMAL LOW (ref 22–32)
Calcium: 8.7 mg/dL — ABNORMAL LOW (ref 8.9–10.3)
Chloride: 112 mmol/L — ABNORMAL HIGH (ref 98–111)
Creatinine, Ser: 1.67 mg/dL — ABNORMAL HIGH (ref 0.61–1.24)
GFR, Estimated: 38 mL/min — ABNORMAL LOW (ref >60)
Glucose, Bld: 90 mg/dL (ref 70–99)
Potassium: 3.3 mmol/L — ABNORMAL LOW (ref 3.5–5.1)
Sodium: 140 mmol/L (ref 135–145)

## 2023-05-10 LAB — CBC
HCT: 25.6 % — ABNORMAL LOW (ref 39.0–52.0)
Hemoglobin: 8.8 g/dL — ABNORMAL LOW (ref 13.0–17.0)
MCH: 35.8 pg — ABNORMAL HIGH (ref 26.0–34.0)
MCHC: 34.4 g/dL (ref 30.0–36.0)
MCV: 104.1 fL — ABNORMAL HIGH (ref 80.0–100.0)
Platelets: 101 10*3/uL — ABNORMAL LOW (ref 150–400)
RBC: 2.46 MIL/uL — ABNORMAL LOW (ref 4.22–5.81)
RDW: 24.3 % — ABNORMAL HIGH (ref 11.5–15.5)
WBC: 5.7 10*3/uL (ref 4.0–10.5)
nRBC: 0 % (ref 0.0–0.2)

## 2023-05-10 MED ORDER — FUROSEMIDE 10 MG/ML IJ SOLN
40.0000 mg | Freq: Once | INTRAMUSCULAR | Status: AC
  Administered 2023-05-10: 40 mg via INTRAVENOUS
  Filled 2023-05-10: qty 4

## 2023-05-10 MED ORDER — VITAMIN B-12 100 MCG PO TABS
500.0000 ug | ORAL_TABLET | Freq: Every day | ORAL | Status: DC
  Administered 2023-05-10 – 2023-05-12 (×3): 500 ug via ORAL
  Filled 2023-05-10 (×3): qty 5

## 2023-05-10 MED ORDER — POTASSIUM CHLORIDE CRYS ER 20 MEQ PO TBCR
40.0000 meq | EXTENDED_RELEASE_TABLET | Freq: Once | ORAL | Status: AC
  Administered 2023-05-10: 40 meq via ORAL
  Filled 2023-05-10: qty 2

## 2023-05-10 NOTE — Plan of Care (Signed)

## 2023-05-10 NOTE — Plan of Care (Signed)
 Pt is alert and oriented x 1. Total care. Vitals stable. Last bm 5/4. Pt had diarrhea multiple times 5/3 Miralax  not given. Pt noted to have deep productive cough. Moderate amount of yellow sputum noted. MD Adefeso aware of change. Evening shift RN reported trouble with taking meds.  Problem: Clinical Measurements: Goal: Respiratory complications will improve Outcome: Not Progressing   Problem: Activity: Goal: Risk for activity intolerance will decrease Outcome: Not Progressing   Problem: Education: Goal: Knowledge of General Education information will improve Description: Including pain rating scale, medication(s)/side effects and non-pharmacologic comfort measures Outcome: Progressing   Problem: Health Behavior/Discharge Planning: Goal: Ability to manage health-related needs will improve Outcome: Progressing   Problem: Clinical Measurements: Goal: Ability to maintain clinical measurements within normal limits will improve Outcome: Progressing Goal: Will remain free from infection Outcome: Progressing Goal: Diagnostic test results will improve Outcome: Progressing Goal: Cardiovascular complication will be avoided Outcome: Progressing   Problem: Nutrition: Goal: Adequate nutrition will be maintained Outcome: Progressing   Problem: Coping: Goal: Level of anxiety will decrease Outcome: Progressing   Problem: Elimination: Goal: Will not experience complications related to bowel motility Outcome: Progressing Goal: Will not experience complications related to urinary retention Outcome: Progressing   Problem: Pain Managment: Goal: General experience of comfort will improve and/or be controlled Outcome: Progressing   Problem: Safety: Goal: Ability to remain free from injury will improve Outcome: Progressing   Problem: Skin Integrity: Goal: Risk for impaired skin integrity will decrease Outcome: Progressing   Problem: Education: Goal: Ability to describe self-care  measures that may prevent or decrease complications (Diabetes Survival Skills Education) will improve Outcome: Progressing Goal: Individualized Educational Video(s) Outcome: Progressing   Problem: Coping: Goal: Ability to adjust to condition or change in health will improve Outcome: Progressing   Problem: Fluid Volume: Goal: Ability to maintain a balanced intake and output will improve Outcome: Progressing   Problem: Health Behavior/Discharge Planning: Goal: Ability to identify and utilize available resources and services will improve Outcome: Progressing Goal: Ability to manage health-related needs will improve Outcome: Progressing   Problem: Metabolic: Goal: Ability to maintain appropriate glucose levels will improve Outcome: Progressing   Problem: Nutritional: Goal: Maintenance of adequate nutrition will improve Outcome: Progressing Goal: Progress toward achieving an optimal weight will improve Outcome: Progressing   Problem: Skin Integrity: Goal: Risk for impaired skin integrity will decrease Outcome: Progressing   Problem: Tissue Perfusion: Goal: Adequacy of tissue perfusion will improve Outcome: Progressing

## 2023-05-10 NOTE — Progress Notes (Signed)
 PROGRESS NOTE  Terrance Miller ZOX:[id number] DOB: 1928/04/28 DOA: 05/04/2023 PCP: System, Provider Not In  Brief History:  89 year old male with advanced vascular dementia currently residing at [facility] with past medical history significant for HFpEF, hyperlipidemia, hypertension, chronic thrombocytopenia, MGUS, glaucoma, chronic constipation, CAD status post PCI, type 2 diabetes mellitus, hyperlipidemia, history of MI in 2004, history of fecal impaction removal in January 2024 by Dr. Sammi Crick, BPH with history of hydrocelectomy by Dr. Oda Bence who was recently discharged from this hospital on 04/27/2023 after treatment for Enterobacter UTI, metabolic encephalopathy.  Patient was sent from [facility] today for nausea and vomiting that started around 5 AM.  Emesis was reported to be very dark in color.  Patient denied abdominal pain but felt bloating in the abdomen.  He is currently undergoing therapy for urinary tract infection.  He was ill-appearing with abdominal distention and tenderness.  CT abdomen demonstrating stercoral colitis with fluid-filled small intestine and stomach.  Surgery and GI was consulted and medicine will be admitting for further management in the hospital.   -NG tube placed on Monday and was given milk of molasses enema with numerous bowel movements thereafter - Monday afternoon removed NG tube but did not have any recurrent vomiting -05/05/23 Hypotensive/hypothermic lactic acidosis -was moved to ICU  -Treated for sepsis, septic shock in ICU setting -05/07/2023 -sepsis physiology resolved  - KUB 4/29 with improvement in colonic ileus - Tolerating clear liquid diet since Wednesday,  - Appetite significantly improved t - Last Bms x  2 on 5/3 and had BM on 5/3  - 05/08/23 Will advance diet to soft today-resolving sepsis physiology, hypertensive now, adding BP meds, transferred out of ICU   Assessment/Plan: Severe Sepsis with septic Shock -   resolving sepsis physiology (resolved hypotension, lactic acidosis, hypothermia) -due to colitis - ff IV norepinephrine  infusion -since morning of 05/07/2023 -Resolved lactic acidosis - IV antibiotics, vancomycin  discontinued, continued Zosyn  - Bactroban -  MRSA screen positive - Blood culture-growing staph epididymis likely contaminant -d/c with amox/clav x 2 more days   Recurrent rectal stool impaction/ Stercoral colitis  / Chronic constipation  Ileus from constipation - appreciate GI consultation and recommendations -Recommend continue bowel regimen with MiraLAX  17 g 3 times daily, clear liquid diet, PPI twice daily - S/p molasses enema treatments   - 05/05/2023 NGT placed for decompression to low intermittent suction but patient pulled out overnight --GI and general surgery were following - Good bowel movements now, continue MiraLAX  17 g 3 times daily advancing diet - discussed with GI service, ok to start clears diet and monitor closely    Hypotension -resolved now hypertensive - secondary to severe sepsis with septic shock,  - S/p treatment with  IV norepinephrine  and oral midodrine  5 mg TID  -discontinued now --Discontinued IV fluid, tolerating p.o. -BP now stable   Acute on chronic renal failure--CKD 3B -baseline creatinine 1.5-1.8 -serum creatinine peaked 2.24 -due to sepsis, hypotension   Hypothermia  --due to sepsis, septic shock-resolved   Acute on Chronic HFpEF - now a bit on hypervolemic side -start IV lasix  -5/4 personally reviewed CXR--increased interstitial markings   HTN -amlodpine restarted     BPH -with urinary retention - he is having urinary retention likely from obstipation - foley catheter placement ordered and urine output being monitoried - no longer has indwelling foley - foley d/ced and patient continues to urinate ok -continue tamulosin   Obesity -BMI 31.95   Chronic  lower extremity edema -09/19/2020 echo EF 55 to 60%, no WMA, grade 1 DD,  RVSP 62 -Certainly, pulmonary hypertension is contributing -Currently not on furosemide  during hospitalization -check urine protein creatinine ratio--0.42   Macrocytic anemia/MGUS -There is been concern of MDS versus other myeloproliferative disorder -Family has deferred bone marrow biopsy -Continue follow-up at [facility] -continue B12 supplementation   Coronary artery disease -No chest pain presently -Continue aspirin  -continue Carvedilol    Hypokalemia -replete -check mag           Family Communication:   daughter at bedside 5/4  Consultants:  GI, gen surgery  Code Status: DNR  DVT Prophylaxis:  [place] Lovenox    Procedures: As Listed in Progress Note Above  Antibiotics: Zosyn  4/28>>5/3 Augmentin 5/3>>      Subjective: Patient denies fevers, chills, headache, chest pain, dyspnea, nausea, vomiting, diarrhea, abdominal pain, dysuria, hematuria,   Objective: Vitals:   05/09/23 1323 05/09/23 2027 05/10/23 0342 05/10/23 1200  BP: (!) 112/59 (!) 155/76 (!) 157/70 (!) 159/73  Pulse: 60 (!) 55 (!) 57 60  Resp:  16 14 20   Temp: 97.6 F (36.4 C) 97.6 F (36.4 C) 97.7 F (36.5 C) 97.7 F (36.5 C)  TempSrc: Axillary Oral Oral Axillary  SpO2: 100% 100% 100% 100%  Weight:      Height:        Intake/Output Summary (Last 24 hours) at 05/10/2023 1322 Last data filed at 05/10/2023 1012 Gross per 24 hour  Intake 580 ml  Output 600 ml  Net -20 ml   Weight change:  Exam:  General:  Pt is alert, follows commands appropriately, not in acute distress HEENT: No icterus, No thrush, No neck mass, [place]/AT Cardiovascular: RRR, S1/S2, no rubs, no gallops Respiratory: bibasilar rales.  No wheeze Abdomen: Soft/+BS, non tender, non distended, no guarding Extremities: 1 + LE edema, No lymphangitis, No petechiae, No rashes, no synovitis   Data Reviewed: I have personally reviewed following labs and imaging studies Basic Metabolic Panel: Recent Labs  Lab  05/05/23 0201 05/06/23 0443 05/07/23 0410 05/08/23 0410 05/09/23 0517 05/10/23 0545  NA 141 138 140 142 140 140  K 4.2 3.8 3.5 3.3* 3.1* 3.3*  CL 110 110 109 111 111 112*  CO2 21* 21* 22 22 20* 20*  GLUCOSE 80 75 81 87 94 90  BUN 49* 43* 38* 32* 26* 22  CREATININE 2.02* 2.24* 2.08* 1.92* 1.84* 1.67*  CALCIUM  8.5* 8.5* 8.6* 8.7* 8.6* 8.7*  MG 2.0  --  2.0  --   --   --    Liver Function Tests: Recent Labs  Lab 05/04/23 0708 05/05/23 0201 05/06/23 0443  AST 41 31 27  ALT 29 21 18   ALKPHOS 85 65 60  BILITOT 1.7* 2.3* 2.4*  PROT 8.1 6.7 6.2*  ALBUMIN 3.7 3.1* 2.8*   Recent Labs  Lab 05/04/23 0708  LIPASE 31   No results for input(s): "AMMONIA" in the last 168 hours. Coagulation Profile: No results for input(s): "INR", "PROTIME" in the last 168 hours. CBC: Recent Labs  Lab 05/04/23 0708 05/04/23 1522 05/05/23 0201 05/06/23 0443 05/07/23 0410 05/08/23 0410 05/10/23 0730  WBC 7.0  --  16.3* 6.5 6.0 4.4 5.7  NEUTROABS 5.6  --  14.5* 4.4 4.4 2.6  --   HGB 11.3*   < > 9.6* 8.1* 8.3* 8.2* 8.8*  HCT 34.1*   < > 29.6* 24.9* 25.2* 25.6* 25.6*  MCV 105.6*  --  107.6* 106.9* 106.3* 106.2* 104.1*  PLT  146*  --  126* 99* 103* 97* 101*   < > = values in this interval not displayed.   Cardiac Enzymes: No results for input(s): "CKTOTAL", "CKMB", "CKMBINDEX", "TROPONINI" in the last 168 hours. BNP: Invalid input(s): "POCBNP" CBG: Recent Labs  Lab 05/06/23 0013 05/06/23 0607 05/07/23 1646 05/07/23 2135 05/08/23 0504  GLUCAP 74 72 99 85 88   HbA1C: No results for input(s): "HGBA1C" in the last 72 hours. Urine analysis:    Component Value Date/Time   COLORURINE YELLOW 05/04/2023 0920   APPEARANCEUR HAZY (A) 05/04/2023 0920   APPEARANCEUR Clear 07/16/2021 1132   LABSPEC 1.018 05/04/2023 0920   PHURINE 5.0 05/04/2023 0920   GLUCOSEU NEGATIVE 05/04/2023 0920   HGBUR NEGATIVE 05/04/2023 0920   BILIRUBINUR NEGATIVE 05/04/2023 0920   BILIRUBINUR Negative 07/16/2021  1132   KETONESUR NEGATIVE 05/04/2023 0920   PROTEINUR 30 (A) 05/04/2023 0920   NITRITE NEGATIVE 05/04/2023 0920   LEUKOCYTESUR NEGATIVE 05/04/2023 0920   Sepsis Labs: @LABRCNTIP (procalcitonin:4,lacticidven:4) ) Recent Results (from the past 240 hours)  Culture, blood (Routine X 2) w Reflex to ID Panel     Status: None   Collection Time: 05/04/23  1:09 PM   Specimen: BLOOD  Result Value Ref Range Status   Specimen Description BLOOD BLOOD LEFT HAND  Final   Special Requests NONE  Final   Culture   Final    NO GROWTH 5 DAYS Performed at [facility], [address] [place], [place] [address]    Report Status 05/09/2023 FINAL  Final  Culture, blood (Routine X 2) w Reflex to ID Panel     Status: Abnormal   Collection Time: 05/04/23  1:25 PM   Specimen: BLOOD  Result Value Ref Range Status   Specimen Description   Final    BLOOD BLOOD RIGHT HAND Performed at [facility], [address], [place], [place] [address]    Special Requests   Final    BOTTLES DRAWN AEROBIC AND ANAEROBIC Blood Culture results may not be optimal due to an inadequate volume of blood received in culture bottles Performed at [facility], [address], [place], [place] [address]    Culture  Setup Time   Final    GRAM POSITIVE COCCI AEROBIC BOTTLE ONLY Gram Stain Report Called to,Read Back By and Verified With: S. Judene Noss, RN AT 2227 05/05/23 BY A. SNYDER CRITICAL RESULT CALLED TO, READ BACK BY AND VERIFIED WITH: A SHELTON,RN@0705  05/06/23 MK    Culture (A)  Final    STAPHYLOCOCCUS EPIDERMIDIS THE SIGNIFICANCE OF ISOLATING THIS ORGANISM FROM A SINGLE SET OF BLOOD CULTURES WHEN MULTIPLE SETS ARE DRAWN IS UNCERTAIN. PLEASE NOTIFY THE MICROBIOLOGY DEPARTMENT WITHIN ONE WEEK IF SPECIATION AND SENSITIVITIES ARE REQUIRED. Performed at [facility], [address], [place], [place] [address]    Report Status 05/07/2023 FINAL  Final  Blood Culture ID Panel (Reflexed)     Status: Abnormal   Collection Time:  05/04/23  1:25 PM  Result Value Ref Range Status   Enterococcus faecalis NOT DETECTED NOT DETECTED Final   Enterococcus Faecium NOT DETECTED NOT DETECTED Final   Listeria monocytogenes NOT DETECTED NOT DETECTED Final   Staphylococcus species DETECTED (A) NOT DETECTED Final    Comment: CRITICAL RESULT CALLED TO, READ BACK BY AND VERIFIED WITH: A SHELTON,RN@0707  05/06/23 MK    Staphylococcus aureus (BCID) NOT DETECTED NOT DETECTED Final   Staphylococcus epidermidis DETECTED (A) NOT DETECTED Final    Comment: CRITICAL RESULT CALLED TO, READ BACK BY  AND VERIFIED WITH: A SHELTON,RN@0707  05/06/23 MK    Staphylococcus lugdunensis NOT DETECTED NOT DETECTED Final   Streptococcus species NOT DETECTED NOT DETECTED Final   Streptococcus agalactiae NOT DETECTED NOT DETECTED Final   Streptococcus pneumoniae NOT DETECTED NOT DETECTED Final   Streptococcus pyogenes NOT DETECTED NOT DETECTED Final   A.calcoaceticus-baumannii NOT DETECTED NOT DETECTED Final   Bacteroides fragilis NOT DETECTED NOT DETECTED Final   Enterobacterales NOT DETECTED NOT DETECTED Final   Enterobacter cloacae complex NOT DETECTED NOT DETECTED Final   Escherichia coli NOT DETECTED NOT DETECTED Final   Klebsiella aerogenes NOT DETECTED NOT DETECTED Final   Klebsiella oxytoca NOT DETECTED NOT DETECTED Final   Klebsiella pneumoniae NOT DETECTED NOT DETECTED Final   Proteus species NOT DETECTED NOT DETECTED Final   Salmonella species NOT DETECTED NOT DETECTED Final   Serratia marcescens NOT DETECTED NOT DETECTED Final   Haemophilus influenzae NOT DETECTED NOT DETECTED Final   Neisseria meningitidis NOT DETECTED NOT DETECTED Final   Pseudomonas aeruginosa NOT DETECTED NOT DETECTED Final   Stenotrophomonas maltophilia NOT DETECTED NOT DETECTED Final   Candida albicans NOT DETECTED NOT DETECTED Final   Candida auris NOT DETECTED NOT DETECTED Final   Candida glabrata NOT DETECTED NOT DETECTED Final   Candida krusei NOT DETECTED NOT  DETECTED Final   Candida parapsilosis NOT DETECTED NOT DETECTED Final   Candida tropicalis NOT DETECTED NOT DETECTED Final   Cryptococcus neoformans/gattii NOT DETECTED NOT DETECTED Final   Methicillin resistance mecA/C NOT DETECTED NOT DETECTED Final    Comment: Performed at [facility], [address], [place], [place] [address]  MRSA Next Gen by PCR, Nasal     Status: Abnormal   Collection Time: 05/04/23  1:36 PM   Specimen: Nasal Mucosa; Nasal Swab  Result Value Ref Range Status   MRSA by PCR Next Gen DETECTED (A) NOT DETECTED Final    Comment: RESULT CALLED TO, READ BACK BY AND VERIFIED WITH: L IRVING AT 1925 ON [id number] BY S DALTON (NOTE) The GeneXpert MRSA Assay (FDA approved for NASAL specimens only), is one component of a comprehensive MRSA colonization surveillance program. It is not intended to diagnose MRSA infection nor to guide or monitor treatment for MRSA infections. Test performance is not FDA approved in patients less than 27 years old. Performed at [facility], [address] [place], [place] [address]      Scheduled Meds:  amLODipine   10 mg Oral Daily   amoxicillin-clavulanate  1 tablet Oral Q12H   aspirin  EC  81 mg Oral Q breakfast   benzonatate  100 mg Oral TID   Chlorhexidine  Gluconate Cloth  6 each Topical Daily   enoxaparin  (LOVENOX ) injection  30 mg Subcutaneous Q24H   feeding supplement  1 Container Oral TID BM   folic acid   1 mg Oral Daily   furosemide   40 mg Intravenous Once   lactobacillus  1 g Oral TID WC   latanoprost   1 drop Both Eyes QHS   melatonin  6 mg Oral QHS   memantine   5 mg Oral Daily   pantoprazole   40 mg Oral BID   polyethylene glycol  17 g Oral TID   rosuvastatin   5 mg Oral Daily   tamsulosin   0.4 mg Oral QPC supper   Continuous Infusions:  Procedures/Studies: DG CHEST PORT 1 VIEW Result Date: 05/10/2023 CLINICAL DATA:  [id number] Dyspnea [id number] 10031 Cough 10031 EXAM: PORTABLE CHEST - 1 VIEW COMPARISON:  04/21/2023.  FINDINGS: Cardiac silhouette is  prominent. There is pulmonary interstitial prominence with vascular congestion. No focal consolidation. No pneumothorax or pleural effusion identified. Aorta is calcified. There are thoracic degenerative changes. IMPRESSION: Findings suggest CHF. Electronically Signed   By: Sydell Eva M.D.   On: 05/10/2023 09:35   DG Abd 1 View Result Date: 05/05/2023 CLINICAL DATA:  Ileus. EXAM: ABDOMEN - 1 VIEW COMPARISON:  05/04/2023 FINDINGS: Temperature probe is now seen within the urinary bladder. Gaseous distention of the colon shows mild decrease since previous study, suggesting improving ileus. IMPRESSION: Mild improvement in colonic ileus. Electronically Signed   By: Marlyce Sine M.D.   On: 05/05/2023 08:27   DG Abd 1 View Result Date: 05/04/2023 CLINICAL DATA:  Nasogastric tube placement. EXAM: ABDOMEN - 1 VIEW COMPARISON:  CT earlier today FINDINGS: Tip and side port of the enteric tube is below the diaphragm in the stomach. The small-bowel distention on CT is not seen on the current exam. Mild gaseous distention of colon. Left colonic stool persists. Radiopaque densities in the left abdomen air in the subcutaneous tissues on C2 IMPRESSION: Tip and side port of the enteric tube below the diaphragm in the stomach. Electronically Signed   By: Chadwick Colonel M.D.   On: 05/04/2023 16:47   CT ABDOMEN PELVIS WO CONTRAST Result Date: 05/04/2023 CLINICAL DATA:  Nausea and vomiting today. Bowel obstruction suspected. EXAM: CT ABDOMEN AND PELVIS WITHOUT CONTRAST TECHNIQUE: Multidetector CT imaging of the abdomen and pelvis was performed following the standard protocol without IV contrast. RADIATION DOSE REDUCTION: This exam was performed according to the departmental dose-optimization program which includes automated exposure control, adjustment of the mA and/or kV according to patient size and/or use of iterative reconstruction technique. COMPARISON:  Abdominopelvic CT 01/27/2022.  FINDINGS: Technical note: Despite efforts by the technologist and patient, mild motion artifact is present on today's exam and could not be eliminated. This reduces exam sensitivity and specificity. Lower chest: Mildly increased dependent opacities at both lung bases, most consistent with atelectasis. The distal esophagus is mildly dilated and fluid-filled. There is aortic and coronary artery atherosclerosis. Moderate bilateral gynecomastia noted. Hepatobiliary: No focal hepatic abnormalities are identified on noncontrast imaging. Dependent high density within the gallbladder lumen suspicious for small gallstones. No evidence of gallbladder wall thickening or significant biliary dilatation. Pancreas: Unremarkable. No pancreatic ductal dilatation or surrounding inflammatory changes. Spleen: Normal in size without focal abnormality. Adrenals/Urinary Tract: Both adrenal glands appear normal. No evidence of urinary tract calculus, suspicious renal lesion or hydronephrosis. Grossly stable renal cysts bilaterally for which no specific follow-up imaging is recommended. The urinary bladder is decompressed without focal abnormality. Stomach/Bowel: No enteric contrast administered. The stomach is moderately distended and fluid-filled. There are multiple mildly dilated loops of fluid-filled small bowel without focal transition point. The proximal colon is fluid-filled without significant distension. There is moderate stool throughout the distal colon. There is mild circumferential rectal wall thickening with perirectal soft tissue stranding. No other bowel wall thickening or surrounding inflammation identified. Vascular/Lymphatic: There are no enlarged abdominal or pelvic lymph nodes. Aortic and branch vessel atherosclerosis without evidence of aneurysm. Reproductive: The prostate gland and seminal vesicles appear unremarkable. Other: Again demonstrated are bilateral inguinal hernias containing fat. The bladder and a small  amount of fluid extend into the left inguinal hernia. There is no definite herniated bowel. No ascites, focal extraluminal fluid collection or pneumoperitoneum. Musculoskeletal: No acute or significant osseous findings. Multilevel spondylosis. IMPRESSION: 1. Mild circumferential rectal wall thickening with perirectal soft tissue stranding, suspicious for recurrent  stercoral colitis. 2. The stomach and small bowel are fluid-filled and mildly dilated without focal transition point, likely secondary to constipation or ileus. 3. Bilateral inguinal hernias containing fat. The bladder and a small amount of fluid extend into the left inguinal hernia. No definite herniated bowel. 4. Probable cholelithiasis without evidence of cholecystitis. 5.  Aortic Atherosclerosis (ICD10-I70.0). Electronically Signed   By: Elmon Hagedorn M.D.   On: 05/04/2023 11:08   ECHOCARDIOGRAM LIMITED Result Date: 04/22/2023    ECHOCARDIOGRAM LIMITED REPORT   Patient Name:   Terrance Miller Date of Exam: 04/22/2023 Medical Rec #:  [id number]    Height:       66.0 in Accession #:    [id number]   Weight:       274.9 lb Date of Birth:  Jun 16, 1928   BSA:          2.289 m Patient Age:    89 years     BP:           138/70 mmHg Patient Gender: M            HR:           61 bpm. Exam Location:  [facility] Procedure: Limited Echo, 3D Echo, Cardiac Doppler and Limited Color Doppler            (Both Spectral and Color Flow Doppler were utilized during            procedure). Indications:    CHF l50.31  History:        Patient has prior history of Echocardiogram examinations, most                 recent 01/02/2023. CHF, CAD and Previous Myocardial Infarction;                 Risk Factors:Hypertension, Dyslipidemia and Diabetes.  Sonographer:    Denese Finn RCS Referring Phys: [id number] OLADAPO ADEFESO IMPRESSIONS  1. Left ventricular ejection fraction, by estimation, is 50 to 55%. Left ventricular ejection fraction by 3D volume is 55 %. The left ventricle has  low normal function. The left ventricle has no regional wall motion abnormalities. There is mild left ventricular hypertrophy. Left ventricular diastolic function could not be evaluated.  2. Right ventricular systolic function is normal. The right ventricular size is normal. There is moderately elevated pulmonary artery systolic pressure. The estimated right ventricular systolic pressure is 52.2 mmHg.  3. Right atrial size was severely dilated.  4. The inferior vena cava is dilated in size with <50% respiratory variability, suggesting right atrial pressure of 15 mmHg. Comparison(s): No significant change from prior study. FINDINGS  Left Ventricle: Left ventricular ejection fraction, by estimation, is 50 to 55%. Left ventricular ejection fraction by 3D volume is 55 %. The left ventricle has low normal function. The left ventricle has no regional wall motion abnormalities. The left ventricular internal cavity size was normal in size. There is mild left ventricular hypertrophy. Left ventricular diastolic function could not be evaluated. Right Ventricle: The right ventricular size is normal. No increase in right ventricular wall thickness. Right ventricular systolic function is normal. There is moderately elevated pulmonary artery systolic pressure. The tricuspid regurgitant velocity is 3.05 m/s, and with an assumed right atrial pressure of 15 mmHg, the estimated right ventricular systolic pressure is 52.2 mmHg. Left Atrium: Left atrial size was not assessed. Right Atrium: Right atrial size was severely dilated. Pericardium: There is no evidence of pericardial effusion. Mitral Valve: The mitral valve  is normal in structure. Tricuspid Valve: The tricuspid valve is normal in structure. Tricuspid valve regurgitation is trivial. Aortic Valve: The aortic valve is tricuspid. Aortic valve regurgitation is not visualized. Pulmonic Valve: The pulmonic valve was normal in structure. Pulmonic valve regurgitation is not visualized.  Aorta: The aortic root is normal in size and structure. Venous: The inferior vena cava is dilated in size with less than 50% respiratory variability, suggesting right atrial pressure of 15 mmHg. IAS/Shunts: No atrial level shunt detected by color flow Doppler. Additional Comments: 3D was performed not requiring image post processing on an independent workstation and was normal.  LEFT VENTRICLE PLAX 2D LVIDd:         4.30 cm LVIDs:         3.00 cm LV PW:         1.10 cm         3D Volume EF LV IVS:        1.30 cm         LV 3D EF:    Left LVOT diam:     2.00 cm                      ventricul LVOT Area:     3.14 cm                     ar                                             ejection                                             fraction                                             by 3D                                             volume is                                             55 %.                                 3D Volume EF:                                3D EF:        55 %                                LV EDV:       147 ml  LV ESV:       66 ml                                LV SV:        81 ml RIGHT VENTRICLE TAPSE (M-mode): 1.9 cm LEFT ATRIUM         Index       RIGHT ATRIUM           Index LA diam:    3.90 cm 1.70 cm/m  RA Area:     26.90 cm                                 RA Volume:   103.00 ml 44.99 ml/m   AORTA Ao Root diam: 3.50 cm TRICUSPID VALVE TR Peak grad:   37.2 mmHg TR Vmax:        305.00 cm/s  SHUNTS Systemic Diam: 2.00 cm Vishnu Priya Mallipeddi Electronically signed by Lucetta Russel Mallipeddi Signature Date/Time: 04/22/2023/12:21:08 PM    Final    CT Head Wo Contrast Result Date: 04/21/2023 CLINICAL DATA:  Mental status change, unknown cause. EXAM: CT HEAD WITHOUT CONTRAST TECHNIQUE: Contiguous axial images were obtained from the base of the skull through the vertex without intravenous contrast. RADIATION DOSE REDUCTION: This exam was performed  according to the departmental dose-optimization program which includes automated exposure control, adjustment of the mA and/or kV according to patient size and/or use of iterative reconstruction technique. COMPARISON:  Head CT 07/29/2022 FINDINGS: Brain: There is no evidence of an acute infarct, intracranial hemorrhage, mass, midline shift, or extra-axial fluid collection. Cerebral white matter hypodensities are unchanged and nonspecific but compatible with mild chronic small vessel ischemic disease. There is mild-to-moderate cerebral atrophy. Vascular: Calcified atherosclerosis at the skull base. No hyperdense vessel. Skull: No acute fracture or suspicious lesion. Sinuses/Orbits: Chronic right-sided sinusitis. Clear mastoid air cells. Bilateral cataract extraction. Other: Punctate bilateral parotid calcifications. IMPRESSION: 1. No evidence of acute intracranial abnormality. 2. Mild chronic small vessel ischemic disease. Electronically Signed   By: Aundra Lee M.D.   On: 04/21/2023 18:53   DG Chest Port 1 View Result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

## 2023-05-10 NOTE — Discharge Summary (Incomplete)
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 It is not intended to diagnose MRSA infection nor to guide or monitor treatment for MRSA infections. Test performance is not FDA approved in patients less than 94 years old. Performed at [facility], [address], [place], [place] [address]     Labs: CBC: Recent Labs  Lab 05/04/23 0708 05/04/23 1522 05/05/23 0201 05/06/23 0443 05/07/23 0410 05/08/23 0410 05/10/23 0730  WBC 7.0  --  16.3* 6.5 6.0 4.4 5.7  NEUTROABS 5.6  --  14.5* 4.4 4.4 2.6  --   HGB 11.3*   < > 9.6* 8.1* 8.3* 8.2* 8.8*  HCT 34.1*   < > 29.6* 24.9* 25.2* 25.6* 25.6*  MCV 105.6*  --  107.6* 106.9* 106.3* 106.2* 104.1*  PLT  146*  --  126* 99* 103* 97* 101*   < > = values in this interval not displayed.   Basic Metabolic Panel: Recent Labs  Lab 05/05/23 0201 05/06/23 0443 05/07/23 0410 05/08/23 0410 05/09/23 0517 05/10/23 0545  NA 141 138 140 142 140 140  K 4.2 3.8 3.5 3.3* 3.1* 3.3*  CL 110 110 109 111 111 112*  CO2 21* 21* 22 22 20* 20*  GLUCOSE 80 75 81 87 94 90  BUN 49* 43* 38* 32* 26* 22  CREATININE 2.02* 2.24* 2.08* 1.92* 1.84* 1.67*  CALCIUM  8.5* 8.5* 8.6* 8.7* 8.6* 8.7*  MG 2.0  --  2.0  --   --   --  Liver Function Tests: Recent Labs  Lab 05/04/23 0708 05/05/23 0201 05/06/23 0443  AST 41 31 27  ALT 29 21 18   ALKPHOS 85 65 60  BILITOT 1.7* 2.3* 2.4*  PROT 8.1 6.7 6.2*  ALBUMIN 3.7 3.1* 2.8*   CBG: Recent Labs  Lab 05/06/23 0013 05/06/23 0607 05/07/23 1646 05/07/23 2135 05/08/23 0504  GLUCAP 74 72 99 85 88    Discharge time spent: greater than 30 minutes.  Signed: Demaris Fillers, MD [facility] 05/10/2023

## 2023-05-11 ENCOUNTER — Inpatient Hospital Stay (HOSPITAL_COMMUNITY)

## 2023-05-11 DIAGNOSIS — I5033 Acute on chronic diastolic (congestive) heart failure: Secondary | ICD-10-CM | POA: Diagnosis not present

## 2023-05-11 DIAGNOSIS — F0150 Vascular dementia without behavioral disturbance: Secondary | ICD-10-CM | POA: Diagnosis not present

## 2023-05-11 DIAGNOSIS — A419 Sepsis, unspecified organism: Secondary | ICD-10-CM | POA: Diagnosis not present

## 2023-05-11 DIAGNOSIS — N1832 Chronic kidney disease, stage 3b: Secondary | ICD-10-CM | POA: Diagnosis not present

## 2023-05-11 LAB — URINALYSIS, W/ REFLEX TO CULTURE (INFECTION SUSPECTED)
Bacteria, UA: NONE SEEN
Bilirubin Urine: NEGATIVE
Glucose, UA: NEGATIVE mg/dL
Ketones, ur: NEGATIVE mg/dL
Leukocytes,Ua: NEGATIVE
Nitrite: NEGATIVE
Protein, ur: NEGATIVE mg/dL
Specific Gravity, Urine: 1.006 (ref 1.005–1.030)
pH: 5 (ref 5.0–8.0)

## 2023-05-11 LAB — BLOOD GAS, VENOUS
Acid-Base Excess: 1.9 mmol/L (ref 0.0–2.0)
Bicarbonate: 25.5 mmol/L (ref 20.0–28.0)
Drawn by: 7012
O2 Saturation: 83.3 %
Patient temperature: 36.5
pCO2, Ven: 34 mmHg — ABNORMAL LOW (ref 44–60)
pH, Ven: 7.48 — ABNORMAL HIGH (ref 7.25–7.43)
pO2, Ven: 50 mmHg — ABNORMAL HIGH (ref 32–45)

## 2023-05-11 LAB — BASIC METABOLIC PANEL WITH GFR
Anion gap: 7 (ref 5–15)
BUN: 20 mg/dL (ref 8–23)
CO2: 21 mmol/L — ABNORMAL LOW (ref 22–32)
Calcium: 8.5 mg/dL — ABNORMAL LOW (ref 8.9–10.3)
Chloride: 112 mmol/L — ABNORMAL HIGH (ref 98–111)
Creatinine, Ser: 1.53 mg/dL — ABNORMAL HIGH (ref 0.61–1.24)
GFR, Estimated: 42 mL/min — ABNORMAL LOW (ref >60)
Glucose, Bld: 113 mg/dL — ABNORMAL HIGH (ref 70–99)
Potassium: 3.3 mmol/L — ABNORMAL LOW (ref 3.5–5.1)
Sodium: 140 mmol/L (ref 135–145)

## 2023-05-11 LAB — MAGNESIUM: Magnesium: 1.7 mg/dL (ref 1.7–2.4)

## 2023-05-11 LAB — AMMONIA: Ammonia: 15 umol/L (ref 9–35)

## 2023-05-11 MED ORDER — FUROSEMIDE 10 MG/ML IJ SOLN
40.0000 mg | Freq: Once | INTRAMUSCULAR | Status: AC
  Administered 2023-05-11: 40 mg via INTRAVENOUS
  Filled 2023-05-11: qty 4

## 2023-05-11 MED ORDER — POTASSIUM CHLORIDE CRYS ER 20 MEQ PO TBCR: 40.0000 meq | EXTENDED_RELEASE_TABLET | Freq: Once | ORAL | Status: DC

## 2023-05-11 MED ORDER — MAGNESIUM SULFATE 2 GM/50ML IV SOLN
2.0000 g | Freq: Once | INTRAVENOUS | Status: AC
  Administered 2023-05-11: 2 g via INTRAVENOUS
  Filled 2023-05-11: qty 50

## 2023-05-11 NOTE — TOC Progression Note (Signed)
 Transition of Care [facility]) - Progression Note    Patient Details  Name: Terrance Miller MRN: [id number] Date of Birth: [birth year]  Transition of Care [facility]) CM/SW Contact  Linnea Richards, LCSW Phone Number: 05/11/2023, 11:19 AM  Clinical Narrative:     TOC following. Pt has auth for SNF. MD anticipating dc tomorrow. Updated [name] at [facility].  TOC will follow.  Expected Discharge Plan: Skilled Nursing Facility Barriers to Discharge: Continued Medical Work up  Expected Discharge Plan and Services     Post Acute Care Choice: Durable Medical Equipment Living arrangements for the past 2 months: Single Family Home Expected Discharge Date: 05/09/23                                     Social Determinants of Health (SDOH) Interventions SDOH Screenings   Food Insecurity: No Food Insecurity (05/04/2023)  Housing: Low Risk  (05/04/2023)  Transportation Needs: No Transportation Needs (05/04/2023)  Utilities: Not At Risk (05/04/2023)  Alcohol Screen: Low Risk  (01/05/2023)  Depression (PHQ2-9): Medium Risk (03/17/2023)  Financial Resource Strain: Low Risk  (01/05/2023)  Physical Activity: Inactive (01/05/2023)  Social Connections: Socially Isolated (05/04/2023)  Stress: No Stress Concern Present (01/05/2023)  Tobacco Use: Medium Risk (04/22/2023)  Health Literacy: Adequate Health Literacy (01/05/2023)    Readmission Risk Interventions    04/24/2023    1:31 PM 04/23/2023   11:23 AM 04/22/2023   10:47 AM  Readmission Risk Prevention Plan  Transportation Screening Complete Complete Complete  HRI or Home Care Consult Complete Complete Complete  Social Work Consult for Recovery Care Planning/Counseling Complete Complete Complete  Palliative Care Screening Complete Complete Complete  Medication Review Oceanographer) Complete Complete Complete

## 2023-05-11 NOTE — Progress Notes (Signed)
 PROGRESS NOTE  Terrance Miller NWG:[id number] DOB: 19-Dec-1928 DOA: 05/04/2023 PCP: System, Provider Not In  Brief History:  89 year old male with advanced vascular dementia currently residing at [facility] with past medical history significant for HFpEF, hyperlipidemia, hypertension, chronic thrombocytopenia, MGUS, glaucoma, chronic constipation, CAD status post PCI, type 2 diabetes mellitus, hyperlipidemia, history of MI in 2004, history of fecal impaction removal in January 2024 by Dr. Sammi Crick, BPH with history of hydrocelectomy by Dr. Oda Bence who was recently discharged from this hospital on 04/27/2023 after treatment for Enterobacter UTI, metabolic encephalopathy.  Patient was sent from [facility] today for nausea and vomiting that started around 5 AM.  Emesis was reported to be very dark in color.  Patient denied abdominal pain but felt bloating in the abdomen.  He is currently undergoing therapy for urinary tract infection.  He was ill-appearing with abdominal distention and tenderness.  CT abdomen demonstrating stercoral colitis with fluid-filled small intestine and stomach.  Surgery and GI was consulted and medicine will be admitting for further management in the hospital.   -NG tube placed on Monday and was given milk of molasses enema with numerous bowel movements thereafter - Monday afternoon removed NG tube but did not have any recurrent vomiting -05/05/23 Hypotensive/hypothermic lactic acidosis -was moved to ICU  -Treated for sepsis, septic shock in ICU setting -05/07/2023 -sepsis physiology resolved  - KUB 4/29 with improvement in colonic ileus - Tolerating clear liquid diet since Wednesday,  - Appetite significantly improved t - Last Bms x  2 on 5/3 and had BM on 5/3  - 05/08/23 Will advance diet to soft today-resolving sepsis physiology, hypertensive now, adding BP meds, transferred out of ICU   Assessment/Plan: Severe Sepsis with septic Shock -   resolving sepsis physiology (resolved hypotension, lactic acidosis, hypothermia) -due to colitis - ff IV norepinephrine  infusion -since morning of 05/07/2023 -Resolved lactic acidosis - IV antibiotics, vancomycin  discontinued, continued Zosyn  - Bactroban -  MRSA screen positive - Blood culture-growing staph epididymis likely contaminant - sepsis physiology resolved -d/c with amox/clav x 2 more days   Recurrent rectal stool impaction/ Stercoral colitis  / Chronic constipation  Ileus from constipation - appreciate GI consultation and recommendations -Recommend continue bowel regimen with MiraLAX  17 g 3 times daily, clear liquid diet, PPI twice daily - S/p molasses enema treatments - 05/05/2023 NGT placed for decompression to low intermittent suction but patient pulled out overnight --GI and general surgery were following - Good bowel movements now, continue MiraLAX  17 g 3 times daily advancing diet - discussed with GI service, - diet advanced to soft diet which pt tolerated  Acute metabolic encephalopathy -primarily due to poor neurologic reserve with slow recover from sepsis in setting of vascular dementia -B12, folate, TSH are WNL -4/28 UA--no pyuria -VBG -MR brain -d/c all hypnotic meds -ammonia   Hypotension -resolved now hypertensive - secondary to severe sepsis with septic shock,  - S/p treatment with  IV norepinephrine  and oral midodrine  5 mg TID  -discontinued now --Discontinued IV fluid, tolerating p.o. -BP now stable/hypertensive   Acute on chronic renal failure--CKD 3B -baseline creatinine 1.5-1.8 -serum creatinine peaked 2.24 -due to sepsis, hypotension   Hypothermia  --due to sepsis, septic shock-resolved   Acute on Chronic HFpEF - now a bit on hypervolemic side -started IV lasix  during hospitalization -5/4 personally reviewed CXR--increased interstitial markings -restart home dose lasix  after d/c--20 mg daily -04/22/23 Echo--EF 50-55%, no WMA, normal RVF  HTN -amlodpine restarted     BPH -with urinary retention - he is having urinary retention likely from obstipation - foley catheter placement ordered and urine output being monitoried - no longer has indwelling foley - foley d/ced and patient continues to urinate ok -continue tamulosin   Obesity -BMI 31.95   Chronic lower extremity edema -09/19/2020 echo EF 55 to 60%, no WMA, grade 1 DD, RVSP 62 -Certainly, pulmonary hypertension is contributing -Currently not on furosemide  during hospitalization -check urine protein creatinine ratio--0.42   Macrocytic anemia/MGUS -There is been concern of MDS versus other myeloproliferative disorder -Family has deferred bone marrow biopsy -Continue follow-up at [facility] -continue B12 supplementation   Coronary artery disease -No chest pain presently -Continue aspirin  -continue Carvedilol     Hypokalemia -repleted -check mag 1.7     Family Communication:   daughters at bedside 5/5  Consultants:  GI, gen surgery  Code Status:  DNR  DVT Prophylaxis:   [place] Lovenox    Procedures: As Listed in Progress Note Above  Antibiotics: Zosyn  4/28>>5/3 Augmentin 5/3>>5/5       Subjective: Pt is awake and answers basic questions.  Denies cp, sob, abd pain, headache.  Having BMs  Objective: Vitals:   05/10/23 0342 05/10/23 1200 05/10/23 2054 05/11/23 0400  BP: (!) 157/70 (!) 159/73 (!) 160/74 (!) 127/95  Pulse: (!) 57 60 (!) 57 64  Resp: 14 20 18 18   Temp: 97.7 F (36.5 C) 97.7 F (36.5 C) 97.7 F (36.5 C) 97.7 F (36.5 C)  TempSrc: Oral Axillary Oral Oral  SpO2: 100% 100% 99% 97%  Weight:      Height:        Intake/Output Summary (Last 24 hours) at 05/11/2023 1435 Last data filed at 05/11/2023 1427 Gross per 24 hour  Intake 600 ml  Output 1850 ml  Net -1250 ml   Weight change:  Exam:  General:  Pt is alert, intermittently follows commands appropriately, not in acute distress HEENT: No icterus, No thrush, No  neck mass, /AT Cardiovascular: RRR, S1/S2, no rubs, no gallops Respiratory: bibasilar rales.  No wheeze Abdomen: Soft/+BS, non tender, non distended, no guarding Extremities: 1 + LE edema, No lymphangitis, No petechiae, No rashes, no synovitis   Data Reviewed: I have personally reviewed following labs and imaging studies Basic Metabolic Panel: Recent Labs  Lab 05/05/23 0201 05/06/23 0443 05/07/23 0410 05/08/23 0410 05/09/23 0517 05/10/23 0545 05/11/23 0446  NA 141   < > 140 142 140 140 140  K 4.2   < > 3.5 3.3* 3.1* 3.3* 3.3*  CL 110   < > 109 111 111 112* 112*  CO2 21*   < > 22 22 20* 20* 21*  GLUCOSE 80   < > 81 87 94 90 113*  BUN 49*   < > 38* 32* 26* 22 20  CREATININE 2.02*   < > 2.08* 1.92* 1.84* 1.67* 1.53*  CALCIUM  8.5*   < > 8.6* 8.7* 8.6* 8.7* 8.5*  MG 2.0  --  2.0  --   --   --  1.7   < > = values in this interval not displayed.   Liver Function Tests: Recent Labs  Lab 05/05/23 0201 05/06/23 0443  AST 31 27  ALT 21 18  ALKPHOS 65 60  BILITOT 2.3* 2.4*  PROT 6.7 6.2*  ALBUMIN 3.1* 2.8*   No results for input(s): "LIPASE", "AMYLASE" in the last 168 hours. No results for input(s): "AMMONIA" in the last 168 hours.  Coagulation Profile: No results for input(s): "INR", "PROTIME" in the last 168 hours. CBC: Recent Labs  Lab 05/05/23 0201 05/06/23 0443 05/07/23 0410 05/08/23 0410 05/10/23 0730  WBC 16.3* 6.5 6.0 4.4 5.7  NEUTROABS 14.5* 4.4 4.4 2.6  --   HGB 9.6* 8.1* 8.3* 8.2* 8.8*  HCT 29.6* 24.9* 25.2* 25.6* 25.6*  MCV 107.6* 106.9* 106.3* 106.2* 104.1*  PLT 126* 99* 103* 97* 101*   Cardiac Enzymes: No results for input(s): "CKTOTAL", "CKMB", "CKMBINDEX", "TROPONINI" in the last 168 hours. BNP: Invalid input(s): "POCBNP" CBG: Recent Labs  Lab 05/06/23 0013 05/06/23 0607 05/07/23 1646 05/07/23 2135 05/08/23 0504  GLUCAP 74 72 99 85 88   HbA1C: No results for input(s): "HGBA1C" in the last 72 hours. Urine analysis:    Component Value  Date/Time   COLORURINE YELLOW 05/04/2023 0920   APPEARANCEUR HAZY (A) 05/04/2023 0920   APPEARANCEUR Clear 07/16/2021 1132   LABSPEC 1.018 05/04/2023 0920   PHURINE 5.0 05/04/2023 0920   GLUCOSEU NEGATIVE 05/04/2023 0920   HGBUR NEGATIVE 05/04/2023 0920   BILIRUBINUR NEGATIVE 05/04/2023 0920   BILIRUBINUR Negative 07/16/2021 1132   KETONESUR NEGATIVE 05/04/2023 0920   PROTEINUR 30 (A) 05/04/2023 0920   NITRITE NEGATIVE 05/04/2023 0920   LEUKOCYTESUR NEGATIVE 05/04/2023 0920   Sepsis Labs: @LABRCNTIP (procalcitonin:4,lacticidven:4) ) Recent Results (from the past 240 hours)  Culture, blood (Routine X 2) w Reflex to ID Panel     Status: None   Collection Time: 05/04/23  1:09 PM   Specimen: BLOOD  Result Value Ref Range Status   Specimen Description BLOOD BLOOD LEFT HAND  Final   Special Requests NONE  Final   Culture   Final    NO GROWTH 5 DAYS Performed at [facility], [address], [place], [place] [address]    Report Status 05/09/2023 FINAL  Final  Culture, blood (Routine X 2) w Reflex to ID Panel     Status: Abnormal   Collection Time: 05/04/23  1:25 PM   Specimen: BLOOD  Result Value Ref Range Status   Specimen Description   Final    BLOOD BLOOD RIGHT HAND Performed at [facility], [address] [place], [place] [address]    Special Requests   Final    BOTTLES DRAWN AEROBIC AND ANAEROBIC Blood Culture results may not be optimal due to an inadequate volume of blood received in culture bottles Performed at [facility], [address] [place], [place] [address]    Culture  Setup Time   Final    GRAM POSITIVE COCCI AEROBIC BOTTLE ONLY Gram Stain Report Called to,Read Back By and Verified With: S. Judene Noss, RN AT 2227 05/05/23 BY A. SNYDER CRITICAL RESULT CALLED TO, READ BACK BY AND VERIFIED WITH: A SHELTON,RN@0705  05/06/23 MK    Culture (A)  Final    STAPHYLOCOCCUS EPIDERMIDIS THE SIGNIFICANCE OF ISOLATING THIS ORGANISM FROM A SINGLE SET OF BLOOD CULTURES WHEN  MULTIPLE SETS ARE DRAWN IS UNCERTAIN. PLEASE NOTIFY THE MICROBIOLOGY DEPARTMENT WITHIN ONE WEEK IF SPECIATION AND SENSITIVITIES ARE REQUIRED. Performed at [facility], [address] [place], [place] [address]    Report Status 05/07/2023 FINAL  Final  Blood Culture ID Panel (Reflexed)     Status: Abnormal   Collection Time: 05/04/23  1:25 PM  Result Value Ref Range Status   Enterococcus faecalis NOT DETECTED NOT DETECTED Final   Enterococcus Faecium NOT DETECTED NOT DETECTED Final   Listeria monocytogenes NOT DETECTED NOT DETECTED Final   Staphylococcus species DETECTED (A)  NOT DETECTED Final    Comment: CRITICAL RESULT CALLED TO, READ BACK BY AND VERIFIED WITH: A SHELTON,RN@0707  05/06/23 MK    Staphylococcus aureus (BCID) NOT DETECTED NOT DETECTED Final   Staphylococcus epidermidis DETECTED (A) NOT DETECTED Final    Comment: CRITICAL RESULT CALLED TO, READ BACK BY AND VERIFIED WITH: A SHELTON,RN@0707  05/06/23 MK    Staphylococcus lugdunensis NOT DETECTED NOT DETECTED Final   Streptococcus species NOT DETECTED NOT DETECTED Final   Streptococcus agalactiae NOT DETECTED NOT DETECTED Final   Streptococcus pneumoniae NOT DETECTED NOT DETECTED Final   Streptococcus pyogenes NOT DETECTED NOT DETECTED Final   A.calcoaceticus-baumannii NOT DETECTED NOT DETECTED Final   Bacteroides fragilis NOT DETECTED NOT DETECTED Final   Enterobacterales NOT DETECTED NOT DETECTED Final   Enterobacter cloacae complex NOT DETECTED NOT DETECTED Final   Escherichia coli NOT DETECTED NOT DETECTED Final   Klebsiella aerogenes NOT DETECTED NOT DETECTED Final   Klebsiella oxytoca NOT DETECTED NOT DETECTED Final   Klebsiella pneumoniae NOT DETECTED NOT DETECTED Final   Proteus species NOT DETECTED NOT DETECTED Final   Salmonella species NOT DETECTED NOT DETECTED Final   Serratia marcescens NOT DETECTED NOT DETECTED Final   Haemophilus influenzae NOT DETECTED NOT DETECTED Final   Neisseria meningitidis  NOT DETECTED NOT DETECTED Final   Pseudomonas aeruginosa NOT DETECTED NOT DETECTED Final   Stenotrophomonas maltophilia NOT DETECTED NOT DETECTED Final   Candida albicans NOT DETECTED NOT DETECTED Final   Candida auris NOT DETECTED NOT DETECTED Final   Candida glabrata NOT DETECTED NOT DETECTED Final   Candida krusei NOT DETECTED NOT DETECTED Final   Candida parapsilosis NOT DETECTED NOT DETECTED Final   Candida tropicalis NOT DETECTED NOT DETECTED Final   Cryptococcus neoformans/gattii NOT DETECTED NOT DETECTED Final   Methicillin resistance mecA/C NOT DETECTED NOT DETECTED Final    Comment: Performed at [facility], [address], [place], [place] [address]  MRSA Next Gen by PCR, Nasal     Status: Abnormal   Collection Time: 05/04/23  1:36 PM   Specimen: Nasal Mucosa; Nasal Swab  Result Value Ref Range Status   MRSA by PCR Next Gen DETECTED (A) NOT DETECTED Final    Comment: RESULT CALLED TO, READ BACK BY AND VERIFIED WITH: L IRVING AT 1925 ON [id number] BY S DALTON (NOTE) The GeneXpert MRSA Assay (FDA approved for NASAL specimens only), is one component of a comprehensive MRSA colonization surveillance program. It is not intended to diagnose MRSA infection nor to guide or monitor treatment for MRSA infections. Test performance is not FDA approved in patients less than 44 years old. Performed at [facility], [address], [place], [place] [address]      Scheduled Meds:  amLODipine   10 mg Oral Daily   amoxicillin-clavulanate  1 tablet Oral Q12H   aspirin  EC  81 mg Oral Q breakfast   Chlorhexidine  Gluconate Cloth  6 each Topical Daily   enoxaparin  (LOVENOX ) injection  30 mg Subcutaneous Q24H   feeding supplement  1 Container Oral TID BM   folic acid   1 mg Oral Daily   furosemide   40 mg Intravenous Once   latanoprost   1 drop Both Eyes QHS   memantine   5 mg Oral Daily   pantoprazole   40 mg Oral BID   polyethylene glycol  17 g Oral TID   potassium chloride   40 mEq Oral  Once   rosuvastatin   5 mg Oral Daily   tamsulosin   0.4 mg Oral QPC supper  vitamin B-12  500 mcg Oral Daily   Continuous Infusions:  magnesium sulfate bolus IVPB 2 g (05/11/23 1427)    Procedures/Studies: DG CHEST PORT 1 VIEW Result Date: 05/10/2023 CLINICAL DATA:  [id number] Dyspnea [id number] 10031 Cough 10031 EXAM: PORTABLE CHEST - 1 VIEW COMPARISON:  04/21/2023. FINDINGS: Cardiac silhouette is prominent. There is pulmonary interstitial prominence with vascular congestion. No focal consolidation. No pneumothorax or pleural effusion identified. Aorta is calcified. There are thoracic degenerative changes. IMPRESSION: Findings suggest CHF. Electronically Signed   By: Sydell Eva M.D.   On: 05/10/2023 09:35   DG Abd 1 View Result Date: 05/05/2023 CLINICAL DATA:  Ileus. EXAM: ABDOMEN - 1 VIEW COMPARISON:  05/04/2023 FINDINGS: Temperature probe is now seen within the urinary bladder. Gaseous distention of the colon shows mild decrease since previous study, suggesting improving ileus. IMPRESSION: Mild improvement in colonic ileus. Electronically Signed   By: Marlyce Sine M.D.   On: 05/05/2023 08:27   DG Abd 1 View Result Date: 05/04/2023 CLINICAL DATA:  Nasogastric tube placement. EXAM: ABDOMEN - 1 VIEW COMPARISON:  CT earlier today FINDINGS: Tip and side port of the enteric tube is below the diaphragm in the stomach. The small-bowel distention on CT is not seen on the current exam. Mild gaseous distention of colon. Left colonic stool persists. Radiopaque densities in the left abdomen air in the subcutaneous tissues on C2 IMPRESSION: Tip and side port of the enteric tube below the diaphragm in the stomach. Electronically Signed   By: Chadwick Colonel M.D.   On: 05/04/2023 16:47   CT ABDOMEN PELVIS WO CONTRAST Result Date: 05/04/2023 CLINICAL DATA:  Nausea and vomiting today. Bowel obstruction suspected. EXAM: CT ABDOMEN AND PELVIS WITHOUT CONTRAST TECHNIQUE: Multidetector CT imaging of the abdomen and  pelvis was performed following the standard protocol without IV contrast. RADIATION DOSE REDUCTION: This exam was performed according to the departmental dose-optimization program which includes automated exposure control, adjustment of the mA and/or kV according to patient size and/or use of iterative reconstruction technique. COMPARISON:  Abdominopelvic CT 01/27/2022. FINDINGS: Technical note: Despite efforts by the technologist and patient, mild motion artifact is present on today's exam and could not be eliminated. This reduces exam sensitivity and specificity. Lower chest: Mildly increased dependent opacities at both lung bases, most consistent with atelectasis. The distal esophagus is mildly dilated and fluid-filled. There is aortic and coronary artery atherosclerosis. Moderate bilateral gynecomastia noted. Hepatobiliary: No focal hepatic abnormalities are identified on noncontrast imaging. Dependent high density within the gallbladder lumen suspicious for small gallstones. No evidence of gallbladder wall thickening or significant biliary dilatation. Pancreas: Unremarkable. No pancreatic ductal dilatation or surrounding inflammatory changes. Spleen: Normal in size without focal abnormality. Adrenals/Urinary Tract: Both adrenal glands appear normal. No evidence of urinary tract calculus, suspicious renal lesion or hydronephrosis. Grossly stable renal cysts bilaterally for which no specific follow-up imaging is recommended. The urinary bladder is decompressed without focal abnormality. Stomach/Bowel: No enteric contrast administered. The stomach is moderately distended and fluid-filled. There are multiple mildly dilated loops of fluid-filled small bowel without focal transition point. The proximal colon is fluid-filled without significant distension. There is moderate stool throughout the distal colon. There is mild circumferential rectal wall thickening with perirectal soft tissue stranding. No other bowel wall  thickening or surrounding inflammation identified. Vascular/Lymphatic: There are no enlarged abdominal or pelvic lymph nodes. Aortic and branch vessel atherosclerosis without evidence of aneurysm. Reproductive: The prostate gland and seminal vesicles appear unremarkable. Other: Again demonstrated are bilateral inguinal hernias  containing fat. The bladder and a small amount of fluid extend into the left inguinal hernia. There is no definite herniated bowel. No ascites, focal extraluminal fluid collection or pneumoperitoneum. Musculoskeletal: No acute or significant osseous findings. Multilevel spondylosis. IMPRESSION: 1. Mild circumferential rectal wall thickening with perirectal soft tissue stranding, suspicious for recurrent stercoral colitis. 2. The stomach and small bowel are fluid-filled and mildly dilated without focal transition point, likely secondary to constipation or ileus. 3. Bilateral inguinal hernias containing fat. The bladder and a small amount of fluid extend into the left inguinal hernia. No definite herniated bowel. 4. Probable cholelithiasis without evidence of cholecystitis. 5.  Aortic Atherosclerosis (ICD10-I70.0). Electronically Signed   By: Elmon Hagedorn M.D.   On: 05/04/2023 11:08   ECHOCARDIOGRAM LIMITED Result Date: 04/22/2023    ECHOCARDIOGRAM LIMITED REPORT   Patient Name:   JWAUN VANZANTE Date of Exam: 04/22/2023 Medical Rec #:  [id number]    Height:       66.0 in Accession #:    [id number]   Weight:       274.9 lb Date of Birth:  01-04-1929   BSA:          2.289 m Patient Age:    89 years     BP:           138/70 mmHg Patient Gender: M            HR:           61 bpm. Exam Location:  [facility] Procedure: Limited Echo, 3D Echo, Cardiac Doppler and Limited Color Doppler            (Both Spectral and Color Flow Doppler were utilized during            procedure). Indications:    CHF l50.31  History:        Patient has prior history of Echocardiogram examinations, most                  recent 01/02/2023. CHF, CAD and Previous Myocardial Infarction;                 Risk Factors:Hypertension, Dyslipidemia and Diabetes.  Sonographer:    Denese Finn RCS Referring Phys: [id number] OLADAPO ADEFESO IMPRESSIONS  1. Left ventricular ejection fraction, by estimation, is 50 to 55%. Left ventricular ejection fraction by 3D volume is 55 %. The left ventricle has low normal function. The left ventricle has no regional wall motion abnormalities. There is mild left ventricular hypertrophy. Left ventricular diastolic function could not be evaluated.  2. Right ventricular systolic function is normal. The right ventricular size is normal. There is moderately elevated pulmonary artery systolic pressure. The estimated right ventricular systolic pressure is 52.2 mmHg.  3. Right atrial size was severely dilated.  4. The inferior vena cava is dilated in size with <50% respiratory variability, suggesting right atrial pressure of 15 mmHg. Comparison(s): No significant change from prior study. FINDINGS  Left Ventricle: Left ventricular ejection fraction, by estimation, is 50 to 55%. Left ventricular ejection fraction by 3D volume is 55 %. The left ventricle has low normal function. The left ventricle has no regional wall motion abnormalities. The left ventricular internal cavity size was normal in size. There is mild left ventricular hypertrophy. Left ventricular diastolic function could not be evaluated. Right Ventricle: The right ventricular size is normal. No increase in right ventricular wall thickness. Right ventricular systolic function is normal. There is moderately elevated pulmonary artery systolic pressure. The  tricuspid regurgitant velocity is 3.05 m/s, and with an assumed right atrial pressure of 15 mmHg, the estimated right ventricular systolic pressure is 52.2 mmHg. Left Atrium: Left atrial size was not assessed. Right Atrium: Right atrial size was severely dilated. Pericardium: There is no evidence of  pericardial effusion. Mitral Valve: The mitral valve is normal in structure. Tricuspid Valve: The tricuspid valve is normal in structure. Tricuspid valve regurgitation is trivial. Aortic Valve: The aortic valve is tricuspid. Aortic valve regurgitation is not visualized. Pulmonic Valve: The pulmonic valve was normal in structure. Pulmonic valve regurgitation is not visualized. Aorta: The aortic root is normal in size and structure. Venous: The inferior vena cava is dilated in size with less than 50% respiratory variability, suggesting right atrial pressure of 15 mmHg. IAS/Shunts: No atrial level shunt detected by color flow Doppler. Additional Comments: 3D was performed not requiring image post processing on an independent workstation and was normal.  LEFT VENTRICLE PLAX 2D LVIDd:         4.30 cm LVIDs:         3.00 cm LV PW:         1.10 cm         3D Volume EF LV IVS:        1.30 cm         LV 3D EF:    Left LVOT diam:     2.00 cm                      ventricul LVOT Area:     3.14 cm                     ar                                             ejection                                             fraction                                             by 3D                                             volume is                                             55 %.                                 3D Volume EF:                                3D EF:        55 %  LV EDV:       147 ml                                LV ESV:       66 ml                                LV SV:        81 ml RIGHT VENTRICLE TAPSE (M-mode): 1.9 cm LEFT ATRIUM         Index       RIGHT ATRIUM           Index LA diam:    3.90 cm 1.70 cm/m  RA Area:     26.90 cm                                 RA Volume:   103.00 ml 44.99 ml/m   AORTA Ao Root diam: 3.50 cm TRICUSPID VALVE TR Peak grad:   37.2 mmHg TR Vmax:        305.00 cm/s  SHUNTS Systemic Diam: 2.00 cm Vishnu Priya Mallipeddi Electronically signed by Lucetta Russel  Mallipeddi Signature Date/Time: 04/22/2023/12:21:08 PM    Final    CT Head Wo Contrast Result Date: 04/21/2023 CLINICAL DATA:  Mental status change, unknown cause. EXAM: CT HEAD WITHOUT CONTRAST TECHNIQUE: Contiguous axial images were obtained from the base of the skull through the vertex without intravenous contrast. RADIATION DOSE REDUCTION: This exam was performed according to the departmental dose-optimization program which includes automated exposure control, adjustment of the mA and/or kV according to patient size and/or use of iterative reconstruction technique. COMPARISON:  Head CT 07/29/2022 FINDINGS: Brain: There is no evidence of an acute infarct, intracranial hemorrhage, mass, midline shift, or extra-axial fluid collection. Cerebral white matter hypodensities are unchanged and nonspecific but compatible with mild chronic small vessel ischemic disease. There is mild-to-moderate cerebral atrophy. Vascular: Calcified atherosclerosis at the skull base. No hyperdense vessel. Skull: No acute fracture or suspicious lesion.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

## 2023-05-11 NOTE — Plan of Care (Signed)
   Problem: Education: Goal: Knowledge of General Education information will improve Description: Including pain rating scale, medication(s)/side effects and non-pharmacologic comfort measures Outcome: Progressing   Problem: Clinical Measurements: Goal: Ability to maintain clinical measurements within normal limits will improve Outcome: Progressing Goal: Diagnostic test results will improve Outcome: Progressing

## 2023-05-11 NOTE — Plan of Care (Signed)

## 2023-05-11 NOTE — Progress Notes (Signed)
 SLP Cancellation Note  Patient Details Name: Terrance Miller MRN: [id number] DOB: 09-30-1928   Cancelled treatment:       Reason Eval/Treat Not Completed: Fatigue/lethargy limiting ability to participate;Patient's level of consciousness. Talked with Pt's daughter at bedside and provided education that Pt should be alert and sitting upright for PO. Reviewed generic aspiration precautions. ST will re-attempt later today; thank you,  Riyaan Heroux H. Vergil Glasser, CCC-SLP Speech Language Pathologist    Florina Husbands 05/11/2023, 10:24 AM

## 2023-05-12 DIAGNOSIS — R680 Hypothermia, not associated with low environmental temperature: Secondary | ICD-10-CM | POA: Diagnosis not present

## 2023-05-12 DIAGNOSIS — Z7982 Long term (current) use of aspirin: Secondary | ICD-10-CM | POA: Diagnosis not present

## 2023-05-12 DIAGNOSIS — E1122 Type 2 diabetes mellitus with diabetic chronic kidney disease: Secondary | ICD-10-CM | POA: Diagnosis not present

## 2023-05-12 DIAGNOSIS — D696 Thrombocytopenia, unspecified: Secondary | ICD-10-CM | POA: Diagnosis not present

## 2023-05-12 DIAGNOSIS — N1832 Chronic kidney disease, stage 3b: Secondary | ICD-10-CM | POA: Diagnosis present

## 2023-05-12 DIAGNOSIS — H409 Unspecified glaucoma: Secondary | ICD-10-CM | POA: Diagnosis not present

## 2023-05-12 DIAGNOSIS — K6289 Other specified diseases of anus and rectum: Secondary | ICD-10-CM | POA: Diagnosis not present

## 2023-05-12 DIAGNOSIS — R6521 Severe sepsis with septic shock: Secondary | ICD-10-CM | POA: Diagnosis not present

## 2023-05-12 DIAGNOSIS — F01518 Vascular dementia, unspecified severity, with other behavioral disturbance: Secondary | ICD-10-CM | POA: Diagnosis present

## 2023-05-12 DIAGNOSIS — G9340 Encephalopathy, unspecified: Secondary | ICD-10-CM | POA: Diagnosis not present

## 2023-05-12 DIAGNOSIS — I509 Heart failure, unspecified: Secondary | ICD-10-CM | POA: Diagnosis not present

## 2023-05-12 DIAGNOSIS — I5032 Chronic diastolic (congestive) heart failure: Secondary | ICD-10-CM | POA: Diagnosis not present

## 2023-05-12 DIAGNOSIS — I700 Atherosclerosis of aorta: Secondary | ICD-10-CM | POA: Diagnosis not present

## 2023-05-12 DIAGNOSIS — J9601 Acute respiratory failure with hypoxia: Secondary | ICD-10-CM | POA: Diagnosis not present

## 2023-05-12 DIAGNOSIS — I5033 Acute on chronic diastolic (congestive) heart failure: Secondary | ICD-10-CM | POA: Diagnosis not present

## 2023-05-12 DIAGNOSIS — M6281 Muscle weakness (generalized): Secondary | ICD-10-CM | POA: Diagnosis not present

## 2023-05-12 DIAGNOSIS — I6523 Occlusion and stenosis of bilateral carotid arteries: Secondary | ICD-10-CM | POA: Diagnosis not present

## 2023-05-12 DIAGNOSIS — G9389 Other specified disorders of brain: Secondary | ICD-10-CM | POA: Diagnosis not present

## 2023-05-12 DIAGNOSIS — T68XXXA Hypothermia, initial encounter: Secondary | ICD-10-CM | POA: Diagnosis not present

## 2023-05-12 DIAGNOSIS — I10 Essential (primary) hypertension: Secondary | ICD-10-CM | POA: Diagnosis not present

## 2023-05-12 DIAGNOSIS — I499 Cardiac arrhythmia, unspecified: Secondary | ICD-10-CM | POA: Diagnosis not present

## 2023-05-12 DIAGNOSIS — I2510 Atherosclerotic heart disease of native coronary artery without angina pectoris: Secondary | ICD-10-CM | POA: Diagnosis present

## 2023-05-12 DIAGNOSIS — N400 Enlarged prostate without lower urinary tract symptoms: Secondary | ICD-10-CM | POA: Diagnosis present

## 2023-05-12 DIAGNOSIS — K5289 Other specified noninfective gastroenteritis and colitis: Secondary | ICD-10-CM | POA: Diagnosis not present

## 2023-05-12 DIAGNOSIS — Z87891 Personal history of nicotine dependence: Secondary | ICD-10-CM | POA: Diagnosis not present

## 2023-05-12 DIAGNOSIS — R1312 Dysphagia, oropharyngeal phase: Secondary | ICD-10-CM | POA: Diagnosis not present

## 2023-05-12 DIAGNOSIS — A419 Sepsis, unspecified organism: Secondary | ICD-10-CM | POA: Diagnosis not present

## 2023-05-12 DIAGNOSIS — Z833 Family history of diabetes mellitus: Secondary | ICD-10-CM | POA: Diagnosis not present

## 2023-05-12 DIAGNOSIS — R531 Weakness: Secondary | ICD-10-CM | POA: Diagnosis not present

## 2023-05-12 DIAGNOSIS — Z955 Presence of coronary angioplasty implant and graft: Secondary | ICD-10-CM | POA: Diagnosis not present

## 2023-05-12 DIAGNOSIS — R001 Bradycardia, unspecified: Secondary | ICD-10-CM | POA: Diagnosis not present

## 2023-05-12 DIAGNOSIS — R918 Other nonspecific abnormal finding of lung field: Secondary | ICD-10-CM | POA: Diagnosis not present

## 2023-05-12 DIAGNOSIS — K567 Ileus, unspecified: Secondary | ICD-10-CM | POA: Diagnosis not present

## 2023-05-12 DIAGNOSIS — G9341 Metabolic encephalopathy: Secondary | ICD-10-CM | POA: Diagnosis not present

## 2023-05-12 DIAGNOSIS — Z8249 Family history of ischemic heart disease and other diseases of the circulatory system: Secondary | ICD-10-CM | POA: Diagnosis not present

## 2023-05-12 DIAGNOSIS — I252 Old myocardial infarction: Secondary | ICD-10-CM | POA: Diagnosis not present

## 2023-05-12 DIAGNOSIS — Z978 Presence of other specified devices: Secondary | ICD-10-CM | POA: Diagnosis not present

## 2023-05-12 DIAGNOSIS — N183 Chronic kidney disease, stage 3 unspecified: Secondary | ICD-10-CM | POA: Diagnosis not present

## 2023-05-12 DIAGNOSIS — R0989 Other specified symptoms and signs involving the circulatory and respiratory systems: Secondary | ICD-10-CM | POA: Diagnosis not present

## 2023-05-12 DIAGNOSIS — Z79899 Other long term (current) drug therapy: Secondary | ICD-10-CM | POA: Diagnosis not present

## 2023-05-12 DIAGNOSIS — Z66 Do not resuscitate: Secondary | ICD-10-CM | POA: Diagnosis not present

## 2023-05-12 DIAGNOSIS — R464 Slowness and poor responsiveness: Secondary | ICD-10-CM | POA: Diagnosis not present

## 2023-05-12 DIAGNOSIS — Z515 Encounter for palliative care: Secondary | ICD-10-CM | POA: Diagnosis not present

## 2023-05-12 DIAGNOSIS — K5909 Other constipation: Secondary | ICD-10-CM | POA: Diagnosis not present

## 2023-05-12 DIAGNOSIS — R278 Other lack of coordination: Secondary | ICD-10-CM | POA: Diagnosis not present

## 2023-05-12 DIAGNOSIS — I130 Hypertensive heart and chronic kidney disease with heart failure and stage 1 through stage 4 chronic kidney disease, or unspecified chronic kidney disease: Secondary | ICD-10-CM | POA: Diagnosis not present

## 2023-05-12 DIAGNOSIS — N138 Other obstructive and reflux uropathy: Secondary | ICD-10-CM | POA: Diagnosis not present

## 2023-05-12 DIAGNOSIS — F03918 Unspecified dementia, unspecified severity, with other behavioral disturbance: Secondary | ICD-10-CM | POA: Diagnosis not present

## 2023-05-12 DIAGNOSIS — K8020 Calculus of gallbladder without cholecystitis without obstruction: Secondary | ICD-10-CM | POA: Diagnosis not present

## 2023-05-12 DIAGNOSIS — E785 Hyperlipidemia, unspecified: Secondary | ICD-10-CM | POA: Diagnosis present

## 2023-05-12 DIAGNOSIS — E1169 Type 2 diabetes mellitus with other specified complication: Secondary | ICD-10-CM | POA: Diagnosis present

## 2023-05-12 DIAGNOSIS — N390 Urinary tract infection, site not specified: Secondary | ICD-10-CM | POA: Diagnosis not present

## 2023-05-12 DIAGNOSIS — D472 Monoclonal gammopathy: Secondary | ICD-10-CM | POA: Diagnosis not present

## 2023-05-12 LAB — BASIC METABOLIC PANEL WITH GFR
Anion gap: 9 (ref 5–15)
BUN: 21 mg/dL (ref 8–23)
CO2: 24 mmol/L (ref 22–32)
Calcium: 8.9 mg/dL (ref 8.9–10.3)
Chloride: 110 mmol/L (ref 98–111)
Creatinine, Ser: 1.67 mg/dL — ABNORMAL HIGH (ref 0.61–1.24)
GFR, Estimated: 38 mL/min — ABNORMAL LOW (ref >60)
Glucose, Bld: 75 mg/dL (ref 70–99)
Potassium: 3.4 mmol/L — ABNORMAL LOW (ref 3.5–5.1)
Sodium: 143 mmol/L (ref 135–145)

## 2023-05-12 LAB — MAGNESIUM: Magnesium: 2 mg/dL (ref 1.7–2.4)

## 2023-05-12 MED ORDER — POTASSIUM CHLORIDE CRYS ER 20 MEQ PO TBCR
40.0000 meq | EXTENDED_RELEASE_TABLET | Freq: Once | ORAL | Status: AC
  Administered 2023-05-12: 40 meq via ORAL
  Filled 2023-05-12: qty 2

## 2023-05-12 MED ORDER — FUROSEMIDE 40 MG PO TABS: 40.0000 mg | ORAL_TABLET | Freq: Every day | ORAL | Status: DC

## 2023-05-12 MED ORDER — FUROSEMIDE 40 MG PO TABS
40.0000 mg | ORAL_TABLET | Freq: Every day | ORAL | Status: DC
Start: 2023-05-13 — End: 2023-06-08

## 2023-05-12 MED ORDER — FUROSEMIDE 10 MG/ML IJ SOLN
40.0000 mg | Freq: Once | INTRAMUSCULAR | Status: AC
  Administered 2023-05-12: 40 mg via INTRAVENOUS
  Filled 2023-05-12: qty 4

## 2023-05-12 NOTE — Discharge Summary (Signed)
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 Final   Proteus species NOT DETECTED NOT DETECTED Final   Salmonella species NOT DETECTED NOT DETECTED Final   Serratia marcescens NOT DETECTED NOT DETECTED Final   Haemophilus influenzae NOT DETECTED NOT DETECTED Final   Neisseria meningitidis NOT DETECTED NOT DETECTED Final   Pseudomonas aeruginosa NOT DETECTED NOT DETECTED Final   Stenotrophomonas maltophilia NOT DETECTED NOT DETECTED Final   Candida albicans NOT DETECTED NOT DETECTED Final   Candida auris NOT DETECTED NOT DETECTED Final   Candida glabrata NOT DETECTED NOT DETECTED Final   Candida krusei NOT DETECTED NOT DETECTED Final   Candida parapsilosis NOT DETECTED NOT DETECTED Final   Candida tropicalis NOT DETECTED NOT DETECTED Final   Cryptococcus neoformans/gattii NOT DETECTED NOT DETECTED Final   Methicillin resistance mecA/C NOT DETECTED NOT DETECTED Final    Comment: Performed at [facility], [address], [place], [place] [address]  MRSA Next Gen by PCR, Nasal     Status: Abnormal    Collection Time: 05/04/23  1:36 PM   Specimen: Nasal Mucosa; Nasal Swab  Result Value Ref Range Status   MRSA by PCR Next Gen DETECTED (A) NOT DETECTED Final    Comment: RESULT CALLED TO, READ BACK BY AND VERIFIED WITH: L IRVING AT 1925 ON [id number] BY S DALTON (NOTE) The GeneXpert MRSA Assay (FDA approved for NASAL specimens only), is one component of a comprehensive MRSA colonization surveillance program. It is not intended to diagnose MRSA infection nor to guide or monitor treatment for MRSA infections. Test performance is not FDA approved in patients less than 41 years old. Performed at [facility], [address], [place], [place] [address]     Labs: CBC: Recent Labs  Lab 05/06/23 0443 05/07/23 0410 05/08/23 0410 05/10/23 0730  WBC 6.5 6.0 4.4 5.7  NEUTROABS 4.4 4.4 2.6  --   HGB 8.1* 8.3* 8.2* 8.8*  HCT 24.9* 25.2* 25.6* 25.6*  MCV 106.9* 106.3* 106.2* 104.1*  PLT 99* 103* 97* 101*   Basic Metabolic Panel: Recent Labs  Lab 05/07/23 0410 05/08/23 0410 05/09/23 0517 05/10/23 0545 05/11/23 0446 05/12/23 0457  NA 140 142 140 140 140 143  K 3.5 3.3* 3.1* 3.3* 3.3* 3.4*  CL 109 111 111 112* 112* 110  CO2 22 22 20* 20* 21* 24  GLUCOSE 81  87 94 90 113* 75  BUN 38* 32* 26* 22 20 21   CREATININE 2.08* 1.92* 1.84* 1.67* 1.53* 1.67*  CALCIUM  8.6* 8.7* 8.6* 8.7* 8.5* 8.9  MG 2.0  --   --   --  1.7 2.0   Liver Function Tests: Recent Labs  Lab 05/06/23 0443  AST 27  ALT 18  ALKPHOS 60  BILITOT 2.4*  PROT 6.2*  ALBUMIN 2.8*   CBG: Recent Labs  Lab 05/06/23 0013 05/06/23 0607 05/07/23 1646 05/07/23 2135 05/08/23 0504  GLUCAP 74 72 99 85 88    Discharge time spent: greater than 30 minutes.  Signed: Demaris Fillers, MD [facility] 05/12/2023

## 2023-05-12 NOTE — Plan of Care (Signed)
  Problem: Clinical Measurements: Goal: Ability to maintain clinical measurements within normal limits will improve Outcome: Progressing   Problem: Elimination: Goal: Will not experience complications related to urinary retention Outcome: Progressing   Problem: Pain Managment: Goal: General experience of comfort will improve and/or be controlled Outcome: Progressing

## 2023-05-12 NOTE — TOC Transition Note (Signed)
 Transition of Care [facility]) - Discharge Note   Patient Details  Name: Terrance Miller MRN: [id number] Date of Birth: 1928/12/01  [facility] CM/SW Contact:  Ander Katos, LCSW Phone Number: 05/12/2023, 11:57 AM   Clinical Narrative:  Pt d/c today back to [facility]. Pt's daughter, [name] and facility aware and agreeable. Will transport by Quirino Buckles waiver completed and on chart. D/C summary sent to SNF. RN given number to call report.      Final next level of care: Skilled Nursing Facility Barriers to Discharge: Barriers Resolved   Patient Goals and CMS Choice Patient states their goals for this hospitalization and ongoing recovery are:: SNF CMS Medicare.gov Compare Post Acute Care list provided to:: Patient Choice offered to / list presented to : Adult Children [place] ownership interest in [facility].provided to:: Patient    Discharge Placement                Patient to be transferred to facility by: [place] Name of family member notified: [name], daughter Patient and family notified of of transfer: 05/12/23  Discharge Plan and Services Additional resources added to the After Visit Summary for       Post Acute Care Choice: Durable Medical Equipment                               Social Drivers of Health (SDOH) Interventions SDOH Screenings   Food Insecurity: No Food Insecurity (05/04/2023)  Housing: Low Risk  (05/04/2023)  Transportation Needs: No Transportation Needs (05/04/2023)  Utilities: Not At Risk (05/04/2023)  Alcohol Screen: Low Risk  (01/05/2023)  Depression (PHQ2-9): Medium Risk (03/17/2023)  Financial Resource Strain: Low Risk  (01/05/2023)  Physical Activity: Inactive (01/05/2023)  Social Connections: Socially Isolated (05/04/2023)  Stress: No Stress Concern Present (01/05/2023)  Tobacco Use: Medium Risk (04/22/2023)  Health Literacy: Adequate Health Literacy (01/05/2023)     Readmission Risk  Interventions    04/24/2023    1:31 PM 04/23/2023   11:23 AM 04/22/2023   10:47 AM  Readmission Risk Prevention Plan  Transportation Screening Complete Complete Complete  HRI or Home Care Consult Complete Complete Complete  Social Work Consult for Recovery Care Planning/Counseling Complete Complete Complete  Palliative Care Screening Complete Complete Complete  Medication Review Oceanographer) Complete Complete Complete

## 2023-05-12 NOTE — Progress Notes (Signed)
 Physical Therapy Treatment Patient Details Name: Terrance Miller MRN: [id number] DOB: Nov 06, 1928 Today's Date: 05/12/2023   History of Present Illness Terrance Miller is a 89 year old male with advanced vascular dementia currently residing at [facility] with past medical history significant for HFpEF, hyperlipidemia, hypertension, chronic thrombocytopenia, MGUS, glaucoma, chronic constipation, CAD status post PCI, type 2 diabetes mellitus, hyperlipidemia, history of MI in 2004, history of fecal impaction removal in January 2024 by Dr. Sammi Crick, BPH with history of hydrocelectomy by Dr. Oda Bence who was recently discharged from this hospital on 04/27/2023 after treatment for Enterobacter UTI, metabolic encephalopathy.     Patient was sent from [facility] today for nausea and vomiting that started around 5 AM.  Emesis was reported to be very dark in color.  Patient denied abdominal pain but felt bloating in the abdomen.  He is currently undergoing therapy for urinary tract infection.  He was ill-appearing with abdominal distention and tenderness.  CT abdomen demonstrating stercoral colitis with fluid-filled small intestine and stomach.  Surgery and GI was consulted and medicine will be admitting for further management in the hospital.    PT Comments  Patient demonstrates slow labored movement for sitting up at bedside with most difficulty scooting to EOB, good tolerance for standing with RW while being cleaned after having bowel movement and demonstrated increased endurance/distance for gait training without loss of balance. Patient tolerated sitting up in chair after therapy - nursing staff notified. Patient will benefit from continued skilled physical therapy in hospital and recommended venue below to increase strength, balance, endurance for safe ADLs and gait.       If plan is discharge home, recommend the following: A little help with bathing/dressing/bathroom;Help with stairs or ramp for  entrance;Assistance with cooking/housework;A lot of help with bathing/dressing/bathroom   Can travel by private vehicle     Yes  Equipment Recommendations  None recommended by PT    Recommendations for Other Services       Precautions / Restrictions Precautions Precautions: Fall Restrictions Weight Bearing Restrictions Per Provider Order: No     Mobility  Bed Mobility Overal bed mobility: Needs Assistance Bed Mobility: Supine to Sit     Supine to sit: Min assist, Mod assist     General bed mobility comments: slow labored movement with  most diffiuclty scooting to EOB    Transfers Overall transfer level: Needs assistance Equipment used: Rolling walker (2 wheels) Transfers: Sit to/from Stand, Bed to chair/wheelchair/BSC Sit to Stand: Min assist, Contact guard assist   Step pivot transfers: Min assist       General transfer comment: slow labored movement    Ambulation/Gait Ambulation/Gait assistance: Min assist Gait Distance (Feet): 35 Feet Assistive device: Rolling walker (2 wheels) Gait Pattern/deviations: Decreased step length - right, Decreased step length - left, Decreased stride length Gait velocity: decreased     General Gait Details: increased endurance distance for gait training with slow labored movement without loss of balance, limited mostly due to fatigue   Stairs             Wheelchair Mobility     Tilt Bed    Modified Rankin (Stroke Patients Only)       Balance Overall balance assessment: Needs assistance Sitting-balance support: Feet supported, No upper extremity supported Sitting balance-Leahy Scale: Fair Sitting balance - Comments: fair/good seated at EOB   Standing balance support: Reliant on assistive device for balance, During functional activity, Bilateral upper extremity supported Standing balance-Leahy Scale: Fair Standing balance comment: using  RW                            Communication  Communication Communication: No apparent difficulties;Other (comment) Factors Affecting Communication: Other (comment) (Patient has poor eye sight)  Cognition Arousal: Alert Behavior During Therapy: WFL for tasks assessed/performed   PT - Cognitive impairments: History of cognitive impairments                         Following commands: Intact      Cueing Cueing Techniques: Verbal cues, Tactile cues  Exercises General Exercises - Lower Extremity Quad Sets: Seated, AROM, Strengthening, Both, 10 reps Long Arc Quad: Seated, AROM, Strengthening, Both, 10 reps Hip Flexion/Marching: Seated, AROM, Strengthening, Both, 10 reps    General Comments        Pertinent Vitals/Pain Pain Assessment Pain Assessment: No/denies pain    Home Living                          Prior Function            PT Goals (current goals can now be found in the care plan section) Acute Rehab PT Goals Patient Stated Goal: return home with family to assist PT Goal Formulation: With patient/family Time For Goal Achievement: 05/20/23 Potential to Achieve Goals: Good Progress towards PT goals: Progressing toward goals    Frequency    Min 3X/week      PT Plan      Co-evaluation              AM-PAC PT "6 Clicks" Mobility   Outcome Measure  Help needed turning from your back to your side while in a flat bed without using bedrails?: A Little Help needed moving from lying on your back to sitting on the side of a flat bed without using bedrails?: A Lot Help needed moving to and from a bed to a chair (including a wheelchair)?: A Little Help needed standing up from a chair using your arms (e.g., wheelchair or bedside chair)?: A Little Help needed to walk in hospital room?: A Little Help needed climbing 3-5 steps with a railing? : A Lot 6 Click Score: 16    End of Session   Activity Tolerance: Patient tolerated treatment well;Patient limited by fatigue Patient left: in  chair;with call bell/phone within reach;with chair alarm set Nurse Communication: Mobility status PT Visit Diagnosis: Unsteadiness on feet (R26.81);Other abnormalities of gait and mobility (R26.89);Muscle weakness (generalized) (M62.81)     Time: 1308-6578 PT Time Calculation (min) (ACUTE ONLY): 32 min  Charges:    $Gait Training: 8-22 mins $Therapeutic Exercise: 8-22 mins PT General Charges $$ ACUTE PT VISIT: 1 Visit                     12:24 PM, 05/12/23 Walton Guppy, MPT Physical Therapist with [facility] [phone] office [phone] mobile phone

## 2023-05-12 NOTE — Care Management Important Message (Signed)
 Important Message  Patient Details  Name: Terrance Miller MRN: [id number] Date of Birth: 05-03-1928   Important Message Given:  Yes - Medicare IM (spoke with daughter [name] at [phone] to review letter)     [name] 05/12/2023, 11:59 AM

## 2023-05-13 DIAGNOSIS — I509 Heart failure, unspecified: Secondary | ICD-10-CM | POA: Diagnosis not present

## 2023-05-13 DIAGNOSIS — M6281 Muscle weakness (generalized): Secondary | ICD-10-CM | POA: Diagnosis not present

## 2023-05-13 DIAGNOSIS — K5289 Other specified noninfective gastroenteritis and colitis: Secondary | ICD-10-CM | POA: Diagnosis not present

## 2023-05-13 DIAGNOSIS — H409 Unspecified glaucoma: Secondary | ICD-10-CM | POA: Diagnosis not present

## 2023-05-13 DIAGNOSIS — I2510 Atherosclerotic heart disease of native coronary artery without angina pectoris: Secondary | ICD-10-CM | POA: Diagnosis not present

## 2023-05-14 DIAGNOSIS — M6281 Muscle weakness (generalized): Secondary | ICD-10-CM | POA: Diagnosis not present

## 2023-05-14 DIAGNOSIS — K5289 Other specified noninfective gastroenteritis and colitis: Secondary | ICD-10-CM | POA: Diagnosis not present

## 2023-05-14 DIAGNOSIS — I509 Heart failure, unspecified: Secondary | ICD-10-CM | POA: Diagnosis not present

## 2023-05-14 DIAGNOSIS — I2510 Atherosclerotic heart disease of native coronary artery without angina pectoris: Secondary | ICD-10-CM | POA: Diagnosis not present

## 2023-05-15 DIAGNOSIS — M6281 Muscle weakness (generalized): Secondary | ICD-10-CM | POA: Diagnosis not present

## 2023-05-15 DIAGNOSIS — K5289 Other specified noninfective gastroenteritis and colitis: Secondary | ICD-10-CM | POA: Diagnosis not present

## 2023-05-15 DIAGNOSIS — H409 Unspecified glaucoma: Secondary | ICD-10-CM | POA: Diagnosis not present

## 2023-05-15 DIAGNOSIS — I509 Heart failure, unspecified: Secondary | ICD-10-CM | POA: Diagnosis not present

## 2023-05-15 DIAGNOSIS — I2510 Atherosclerotic heart disease of native coronary artery without angina pectoris: Secondary | ICD-10-CM | POA: Diagnosis not present

## 2023-05-18 DIAGNOSIS — I2510 Atherosclerotic heart disease of native coronary artery without angina pectoris: Secondary | ICD-10-CM | POA: Diagnosis not present

## 2023-05-18 DIAGNOSIS — I509 Heart failure, unspecified: Secondary | ICD-10-CM | POA: Diagnosis not present

## 2023-05-18 DIAGNOSIS — K5289 Other specified noninfective gastroenteritis and colitis: Secondary | ICD-10-CM | POA: Diagnosis not present

## 2023-05-18 DIAGNOSIS — M6281 Muscle weakness (generalized): Secondary | ICD-10-CM | POA: Diagnosis not present

## 2023-05-18 DIAGNOSIS — H409 Unspecified glaucoma: Secondary | ICD-10-CM | POA: Diagnosis not present

## 2023-05-20 DIAGNOSIS — H409 Unspecified glaucoma: Secondary | ICD-10-CM | POA: Diagnosis not present

## 2023-05-20 DIAGNOSIS — I509 Heart failure, unspecified: Secondary | ICD-10-CM | POA: Diagnosis not present

## 2023-05-20 DIAGNOSIS — I2510 Atherosclerotic heart disease of native coronary artery without angina pectoris: Secondary | ICD-10-CM | POA: Diagnosis not present

## 2023-05-20 DIAGNOSIS — Z515 Encounter for palliative care: Secondary | ICD-10-CM | POA: Diagnosis not present

## 2023-05-20 DIAGNOSIS — K5289 Other specified noninfective gastroenteritis and colitis: Secondary | ICD-10-CM | POA: Diagnosis not present

## 2023-05-20 DIAGNOSIS — M6281 Muscle weakness (generalized): Secondary | ICD-10-CM | POA: Diagnosis not present

## 2023-05-28 ENCOUNTER — Encounter (HOSPITAL_COMMUNITY): Payer: Self-pay

## 2023-05-28 ENCOUNTER — Emergency Department (HOSPITAL_COMMUNITY)

## 2023-05-28 ENCOUNTER — Inpatient Hospital Stay (HOSPITAL_COMMUNITY)
Admission: EM | Admit: 2023-05-28 | Discharge: 2023-06-01 | DRG: 393 | Disposition: A | Discharge status: Skilled Nursing Facility | Source: Skilled Nursing Facility | Attending: Family Medicine | Admitting: Family Medicine

## 2023-05-28 ENCOUNTER — Other Ambulatory Visit: Payer: Self-pay

## 2023-05-28 DIAGNOSIS — Z955 Presence of coronary angioplasty implant and graft: Secondary | ICD-10-CM

## 2023-05-28 DIAGNOSIS — I5033 Acute on chronic diastolic (congestive) heart failure: Secondary | ICD-10-CM | POA: Diagnosis not present

## 2023-05-28 DIAGNOSIS — I2510 Atherosclerotic heart disease of native coronary artery without angina pectoris: Secondary | ICD-10-CM | POA: Diagnosis present

## 2023-05-28 DIAGNOSIS — E1122 Type 2 diabetes mellitus with diabetic chronic kidney disease: Secondary | ICD-10-CM | POA: Diagnosis not present

## 2023-05-28 DIAGNOSIS — N400 Enlarged prostate without lower urinary tract symptoms: Secondary | ICD-10-CM | POA: Diagnosis present

## 2023-05-28 DIAGNOSIS — Z87891 Personal history of nicotine dependence: Secondary | ICD-10-CM | POA: Diagnosis not present

## 2023-05-28 DIAGNOSIS — F03918 Unspecified dementia, unspecified severity, with other behavioral disturbance: Secondary | ICD-10-CM

## 2023-05-28 DIAGNOSIS — I130 Hypertensive heart and chronic kidney disease with heart failure and stage 1 through stage 4 chronic kidney disease, or unspecified chronic kidney disease: Secondary | ICD-10-CM | POA: Diagnosis present

## 2023-05-28 DIAGNOSIS — K8020 Calculus of gallbladder without cholecystitis without obstruction: Secondary | ICD-10-CM | POA: Diagnosis not present

## 2023-05-28 DIAGNOSIS — R918 Other nonspecific abnormal finding of lung field: Secondary | ICD-10-CM | POA: Diagnosis not present

## 2023-05-28 DIAGNOSIS — I10 Essential (primary) hypertension: Secondary | ICD-10-CM | POA: Diagnosis not present

## 2023-05-28 DIAGNOSIS — Z66 Do not resuscitate: Secondary | ICD-10-CM | POA: Diagnosis present

## 2023-05-28 DIAGNOSIS — K6289 Other specified diseases of anus and rectum: Secondary | ICD-10-CM | POA: Diagnosis not present

## 2023-05-28 DIAGNOSIS — G9341 Metabolic encephalopathy: Secondary | ICD-10-CM | POA: Diagnosis present

## 2023-05-28 DIAGNOSIS — Z833 Family history of diabetes mellitus: Secondary | ICD-10-CM | POA: Diagnosis not present

## 2023-05-28 DIAGNOSIS — R680 Hypothermia, not associated with low environmental temperature: Secondary | ICD-10-CM | POA: Diagnosis not present

## 2023-05-28 DIAGNOSIS — Z79899 Other long term (current) drug therapy: Secondary | ICD-10-CM

## 2023-05-28 DIAGNOSIS — R531 Weakness: Secondary | ICD-10-CM | POA: Diagnosis not present

## 2023-05-28 DIAGNOSIS — Z8249 Family history of ischemic heart disease and other diseases of the circulatory system: Secondary | ICD-10-CM

## 2023-05-28 DIAGNOSIS — N1832 Chronic kidney disease, stage 3b: Secondary | ICD-10-CM | POA: Diagnosis present

## 2023-05-28 DIAGNOSIS — I499 Cardiac arrhythmia, unspecified: Secondary | ICD-10-CM | POA: Diagnosis not present

## 2023-05-28 DIAGNOSIS — Z7982 Long term (current) use of aspirin: Secondary | ICD-10-CM

## 2023-05-28 DIAGNOSIS — D696 Thrombocytopenia, unspecified: Secondary | ICD-10-CM | POA: Diagnosis not present

## 2023-05-28 DIAGNOSIS — T68XXXA Hypothermia, initial encounter: Secondary | ICD-10-CM | POA: Diagnosis not present

## 2023-05-28 DIAGNOSIS — K567 Ileus, unspecified: Secondary | ICD-10-CM | POA: Diagnosis not present

## 2023-05-28 DIAGNOSIS — E785 Hyperlipidemia, unspecified: Secondary | ICD-10-CM | POA: Diagnosis not present

## 2023-05-28 DIAGNOSIS — I6523 Occlusion and stenosis of bilateral carotid arteries: Secondary | ICD-10-CM | POA: Diagnosis not present

## 2023-05-28 DIAGNOSIS — I252 Old myocardial infarction: Secondary | ICD-10-CM | POA: Diagnosis not present

## 2023-05-28 DIAGNOSIS — E1169 Type 2 diabetes mellitus with other specified complication: Secondary | ICD-10-CM | POA: Diagnosis present

## 2023-05-28 DIAGNOSIS — I5032 Chronic diastolic (congestive) heart failure: Secondary | ICD-10-CM | POA: Diagnosis not present

## 2023-05-28 DIAGNOSIS — R464 Slowness and poor responsiveness: Secondary | ICD-10-CM | POA: Diagnosis not present

## 2023-05-28 DIAGNOSIS — F01518 Vascular dementia, unspecified severity, with other behavioral disturbance: Secondary | ICD-10-CM | POA: Diagnosis present

## 2023-05-28 DIAGNOSIS — R001 Bradycardia, unspecified: Secondary | ICD-10-CM | POA: Diagnosis not present

## 2023-05-28 DIAGNOSIS — N281 Cyst of kidney, acquired: Secondary | ICD-10-CM | POA: Diagnosis not present

## 2023-05-28 DIAGNOSIS — R0989 Other specified symptoms and signs involving the circulatory and respiratory systems: Secondary | ICD-10-CM | POA: Diagnosis not present

## 2023-05-28 DIAGNOSIS — G9389 Other specified disorders of brain: Secondary | ICD-10-CM | POA: Diagnosis not present

## 2023-05-28 LAB — CBC WITH DIFFERENTIAL/PLATELET
Abs Immature Granulocytes: 0.02 10*3/uL (ref 0.00–0.07)
Basophils Absolute: 0 10*3/uL (ref 0.0–0.1)
Basophils Relative: 0 %
Eosinophils Absolute: 0.2 10*3/uL (ref 0.0–0.5)
Eosinophils Relative: 3 %
HCT: 29.1 % — ABNORMAL LOW (ref 39.0–52.0)
Hemoglobin: 9.9 g/dL — ABNORMAL LOW (ref 13.0–17.0)
Immature Granulocytes: 0 %
Lymphocytes Relative: 20 %
Lymphs Abs: 1.3 10*3/uL (ref 0.7–4.0)
MCH: 35.4 pg — ABNORMAL HIGH (ref 26.0–34.0)
MCHC: 34 g/dL (ref 30.0–36.0)
MCV: 103.9 fL — ABNORMAL HIGH (ref 80.0–100.0)
Monocytes Absolute: 0.5 10*3/uL (ref 0.1–1.0)
Monocytes Relative: 8 %
Neutro Abs: 4.6 10*3/uL (ref 1.7–7.7)
Neutrophils Relative %: 69 %
Platelets: 78 10*3/uL — ABNORMAL LOW (ref 150–400)
RBC: 2.8 MIL/uL — ABNORMAL LOW (ref 4.22–5.81)
RDW: 22.4 % — ABNORMAL HIGH (ref 11.5–15.5)
Smear Review: DECREASED
WBC: 6.7 10*3/uL (ref 4.0–10.5)
nRBC: 0.6 % — ABNORMAL HIGH (ref 0.0–0.2)

## 2023-05-28 LAB — COMPREHENSIVE METABOLIC PANEL WITH GFR
ALT: 64 U/L — ABNORMAL HIGH (ref 0–44)
AST: 79 U/L — ABNORMAL HIGH (ref 15–41)
Albumin: 3.3 g/dL — ABNORMAL LOW (ref 3.5–5.0)
Alkaline Phosphatase: 93 U/L (ref 38–126)
Anion gap: 6 (ref 5–15)
BUN: 37 mg/dL — ABNORMAL HIGH (ref 8–23)
CO2: 21 mmol/L — ABNORMAL LOW (ref 22–32)
Calcium: 9.2 mg/dL (ref 8.9–10.3)
Chloride: 114 mmol/L — ABNORMAL HIGH (ref 98–111)
Creatinine, Ser: 1.59 mg/dL — ABNORMAL HIGH (ref 0.61–1.24)
GFR, Estimated: 40 mL/min — ABNORMAL LOW (ref >60)
Glucose, Bld: 121 mg/dL — ABNORMAL HIGH (ref 70–99)
Potassium: 3.9 mmol/L (ref 3.5–5.1)
Sodium: 141 mmol/L (ref 135–145)
Total Bilirubin: 0.9 mg/dL (ref 0.0–1.2)
Total Protein: 7.6 g/dL (ref 6.5–8.1)

## 2023-05-28 LAB — CBG MONITORING, ED: Glucose-Capillary: 119 mg/dL — ABNORMAL HIGH (ref 70–99)

## 2023-05-28 LAB — TROPONIN I (HIGH SENSITIVITY): Troponin I (High Sensitivity): 11 ng/L (ref <18)

## 2023-05-28 LAB — MAGNESIUM: Magnesium: 2.1 mg/dL (ref 1.7–2.4)

## 2023-05-28 MED ORDER — LACTATED RINGERS IV BOLUS
1000.0000 mL | Freq: Once | INTRAVENOUS | Status: AC
  Administered 2023-05-28: 1000 mL via INTRAVENOUS

## 2023-05-28 NOTE — ED Notes (Signed)
 Patient transported to CT

## 2023-05-28 NOTE — ED Notes (Signed)
 [place], [place] notified EDP Dr. Drury Geralds of patient's rectal temp of 12F; patient placed on Bair hugger.
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
## 2023-05-28 NOTE — ED Notes (Signed)
 ED Provider at bedside.

## 2023-05-28 NOTE — ED Triage Notes (Addendum)
 Patient arrives with [facility] from [facility] c/o "not feeling well today." Per EMS, the facility stated the patient was "confused" and "has an abnormal gaze." Per EMS, patient is a&o to person and time and is at his baseline; reports patient's speech is "at his baseline but the slow responsiveness is not." Per the facility, the patient was hypotensive, had decreased bowel sounds and heart rate today. BP was 130/60 per EMS. Patient reports hx of CVA.

## 2023-05-29 ENCOUNTER — Encounter (HOSPITAL_COMMUNITY): Payer: Self-pay | Admitting: Internal Medicine

## 2023-05-29 ENCOUNTER — Emergency Department (HOSPITAL_COMMUNITY)

## 2023-05-29 DIAGNOSIS — Z87891 Personal history of nicotine dependence: Secondary | ICD-10-CM | POA: Diagnosis not present

## 2023-05-29 DIAGNOSIS — F03918 Unspecified dementia, unspecified severity, with other behavioral disturbance: Secondary | ICD-10-CM

## 2023-05-29 DIAGNOSIS — Z7401 Bed confinement status: Secondary | ICD-10-CM | POA: Diagnosis not present

## 2023-05-29 DIAGNOSIS — I5032 Chronic diastolic (congestive) heart failure: Secondary | ICD-10-CM | POA: Diagnosis present

## 2023-05-29 DIAGNOSIS — K6289 Other specified diseases of anus and rectum: Secondary | ICD-10-CM | POA: Diagnosis present

## 2023-05-29 DIAGNOSIS — I10 Essential (primary) hypertension: Secondary | ICD-10-CM | POA: Diagnosis not present

## 2023-05-29 DIAGNOSIS — Z955 Presence of coronary angioplasty implant and graft: Secondary | ICD-10-CM | POA: Diagnosis not present

## 2023-05-29 DIAGNOSIS — E1169 Type 2 diabetes mellitus with other specified complication: Secondary | ICD-10-CM | POA: Diagnosis not present

## 2023-05-29 DIAGNOSIS — E785 Hyperlipidemia, unspecified: Secondary | ICD-10-CM

## 2023-05-29 DIAGNOSIS — I2510 Atherosclerotic heart disease of native coronary artery without angina pectoris: Secondary | ICD-10-CM

## 2023-05-29 DIAGNOSIS — R680 Hypothermia, not associated with low environmental temperature: Secondary | ICD-10-CM | POA: Diagnosis present

## 2023-05-29 DIAGNOSIS — N1832 Chronic kidney disease, stage 3b: Secondary | ICD-10-CM | POA: Diagnosis not present

## 2023-05-29 DIAGNOSIS — Z79899 Other long term (current) drug therapy: Secondary | ICD-10-CM | POA: Diagnosis not present

## 2023-05-29 DIAGNOSIS — N281 Cyst of kidney, acquired: Secondary | ICD-10-CM | POA: Diagnosis not present

## 2023-05-29 DIAGNOSIS — K8020 Calculus of gallbladder without cholecystitis without obstruction: Secondary | ICD-10-CM | POA: Diagnosis present

## 2023-05-29 DIAGNOSIS — Z66 Do not resuscitate: Secondary | ICD-10-CM | POA: Diagnosis present

## 2023-05-29 DIAGNOSIS — F01518 Vascular dementia, unspecified severity, with other behavioral disturbance: Secondary | ICD-10-CM | POA: Diagnosis present

## 2023-05-29 DIAGNOSIS — D696 Thrombocytopenia, unspecified: Secondary | ICD-10-CM | POA: Diagnosis present

## 2023-05-29 DIAGNOSIS — N400 Enlarged prostate without lower urinary tract symptoms: Secondary | ICD-10-CM | POA: Diagnosis present

## 2023-05-29 DIAGNOSIS — Z7982 Long term (current) use of aspirin: Secondary | ICD-10-CM | POA: Diagnosis not present

## 2023-05-29 DIAGNOSIS — Z8249 Family history of ischemic heart disease and other diseases of the circulatory system: Secondary | ICD-10-CM | POA: Diagnosis not present

## 2023-05-29 DIAGNOSIS — T68XXXA Hypothermia, initial encounter: Secondary | ICD-10-CM | POA: Diagnosis not present

## 2023-05-29 DIAGNOSIS — E1122 Type 2 diabetes mellitus with diabetic chronic kidney disease: Secondary | ICD-10-CM | POA: Diagnosis present

## 2023-05-29 DIAGNOSIS — I252 Old myocardial infarction: Secondary | ICD-10-CM | POA: Diagnosis not present

## 2023-05-29 DIAGNOSIS — Z833 Family history of diabetes mellitus: Secondary | ICD-10-CM | POA: Diagnosis not present

## 2023-05-29 DIAGNOSIS — I130 Hypertensive heart and chronic kidney disease with heart failure and stage 1 through stage 4 chronic kidney disease, or unspecified chronic kidney disease: Secondary | ICD-10-CM | POA: Diagnosis present

## 2023-05-29 DIAGNOSIS — G9341 Metabolic encephalopathy: Secondary | ICD-10-CM | POA: Diagnosis present

## 2023-05-29 LAB — GLUCOSE, CAPILLARY
Glucose-Capillary: 82 mg/dL (ref 70–99)
Glucose-Capillary: 110 mg/dL — ABNORMAL HIGH (ref 70–99)
Glucose-Capillary: 88 mg/dL (ref 70–99)

## 2023-05-29 LAB — URINALYSIS, ROUTINE W REFLEX MICROSCOPIC
Bilirubin Urine: NEGATIVE
Glucose, UA: NEGATIVE mg/dL
Hgb urine dipstick: NEGATIVE
Ketones, ur: NEGATIVE mg/dL
Leukocytes,Ua: NEGATIVE
Nitrite: NEGATIVE
Protein, ur: 30 mg/dL — AB
Specific Gravity, Urine: 1.018 (ref 1.005–1.030)
pH: 5 (ref 5.0–8.0)

## 2023-05-29 LAB — BASIC METABOLIC PANEL WITH GFR
Anion gap: 5 (ref 5–15)
BUN: 37 mg/dL — ABNORMAL HIGH (ref 8–23)
CO2: 21 mmol/L — ABNORMAL LOW (ref 22–32)
Calcium: 8.6 mg/dL — ABNORMAL LOW (ref 8.9–10.3)
Chloride: 117 mmol/L — ABNORMAL HIGH (ref 98–111)
Creatinine, Ser: 1.64 mg/dL — ABNORMAL HIGH (ref 0.61–1.24)
GFR, Estimated: 39 mL/min — ABNORMAL LOW (ref >60)
Glucose, Bld: 70 mg/dL (ref 70–99)
Potassium: 3.5 mmol/L (ref 3.5–5.1)
Sodium: 143 mmol/L (ref 135–145)

## 2023-05-29 LAB — CBC
HCT: 25.5 % — ABNORMAL LOW (ref 39.0–52.0)
Hemoglobin: 8.6 g/dL — ABNORMAL LOW (ref 13.0–17.0)
MCH: 34.4 pg — ABNORMAL HIGH (ref 26.0–34.0)
MCHC: 33.7 g/dL (ref 30.0–36.0)
MCV: 102 fL — ABNORMAL HIGH (ref 80.0–100.0)
Platelets: 67 10*3/uL — ABNORMAL LOW (ref 150–400)
RBC: 2.5 MIL/uL — ABNORMAL LOW (ref 4.22–5.81)
RDW: 22.4 % — ABNORMAL HIGH (ref 11.5–15.5)
WBC: 4.4 10*3/uL (ref 4.0–10.5)
nRBC: 0.9 % — ABNORMAL HIGH (ref 0.0–0.2)

## 2023-05-29 LAB — TSH: TSH: 2.457 u[IU]/mL (ref 0.350–4.500)

## 2023-05-29 LAB — TROPONIN I (HIGH SENSITIVITY): Troponin I (High Sensitivity): 10 ng/L (ref <18)

## 2023-05-29 MED ORDER — POLYETHYLENE GLYCOL 3350 17 G PO PACK
17.0000 g | PACK | Freq: Two times a day (BID) | ORAL | Status: DC
  Administered 2023-05-30 – 2023-06-01 (×5): 17 g via ORAL
  Filled 2023-05-29 (×7): qty 1

## 2023-05-29 MED ORDER — INSULIN ASPART 100 UNIT/ML IJ SOLN: 0.0000 [IU] | Freq: Three times a day (TID) | INTRAMUSCULAR | Status: DC

## 2023-05-29 MED ORDER — LATANOPROST 0.005 % OP SOLN
1.0000 [drp] | Freq: Two times a day (BID) | OPHTHALMIC | Status: DC
  Administered 2023-05-30 – 2023-06-01 (×5): 1 [drp] via OPHTHALMIC
  Filled 2023-05-29 (×2): qty 2.5

## 2023-05-29 MED ORDER — PIPERACILLIN-TAZOBACTAM 3.375 G IVPB 30 MIN: 3.3750 g | Freq: Four times a day (QID) | INTRAVENOUS | Status: DC

## 2023-05-29 MED ORDER — SODIUM CHLORIDE 0.9 % IV SOLN: INTRAVENOUS | Status: DC

## 2023-05-29 MED ORDER — PIPERACILLIN-TAZOBACTAM 3.375 G IVPB
3.3750 g | Freq: Three times a day (TID) | INTRAVENOUS | Status: DC
  Administered 2023-05-29 – 2023-06-01 (×10): 3.375 g via INTRAVENOUS
  Filled 2023-05-29 (×9): qty 50

## 2023-05-29 MED ORDER — ONDANSETRON HCL 4 MG PO TABS
4.0000 mg | ORAL_TABLET | Freq: Four times a day (QID) | ORAL | Status: DC | PRN
Start: 2023-05-29 — End: 2023-06-01

## 2023-05-29 MED ORDER — MEMANTINE HCL 10 MG PO TABS
5.0000 mg | ORAL_TABLET | Freq: Every day | ORAL | Status: DC
  Administered 2023-05-30 – 2023-06-01 (×3): 5 mg via ORAL
  Filled 2023-05-29 (×4): qty 1

## 2023-05-29 MED ORDER — ENOXAPARIN SODIUM 30 MG/0.3ML IJ SOSY
30.0000 mg | PREFILLED_SYRINGE | INTRAMUSCULAR | Status: DC
  Administered 2023-05-29: 30 mg via SUBCUTANEOUS
  Filled 2023-05-29: qty 0.3

## 2023-05-29 MED ORDER — ONDANSETRON HCL 4 MG/2ML IJ SOLN: 4.0000 mg | Freq: Four times a day (QID) | INTRAMUSCULAR | Status: DC | PRN

## 2023-05-29 MED ORDER — ACETAMINOPHEN 650 MG RE SUPP: 650.0000 mg | Freq: Four times a day (QID) | RECTAL | Status: DC | PRN

## 2023-05-29 MED ORDER — ALBUTEROL SULFATE (2.5 MG/3ML) 0.083% IN NEBU: 2.5000 mg | INHALATION_SOLUTION | Freq: Four times a day (QID) | RESPIRATORY_TRACT | Status: DC | PRN

## 2023-05-29 MED ORDER — ROSUVASTATIN CALCIUM 10 MG PO TABS
5.0000 mg | ORAL_TABLET | Freq: Every day | ORAL | Status: DC
  Administered 2023-05-30 – 2023-06-01 (×3): 5 mg via ORAL
  Filled 2023-05-29 (×4): qty 1

## 2023-05-29 MED ORDER — ENOXAPARIN SODIUM 40 MG/0.4ML IJ SOSY
40.0000 mg | PREFILLED_SYRINGE | INTRAMUSCULAR | Status: DC
  Administered 2023-05-30: 40 mg via SUBCUTANEOUS
  Filled 2023-05-29: qty 0.4

## 2023-05-29 MED ORDER — ASPIRIN 81 MG PO TBEC
81.0000 mg | DELAYED_RELEASE_TABLET | Freq: Every day | ORAL | Status: DC
  Administered 2023-05-29 – 2023-05-30 (×2): 81 mg via ORAL
  Filled 2023-05-29 (×3): qty 1

## 2023-05-29 MED ORDER — TAMSULOSIN HCL 0.4 MG PO CAPS
0.4000 mg | ORAL_CAPSULE | Freq: Every day | ORAL | Status: DC
  Administered 2023-05-30 – 2023-06-01 (×3): 0.4 mg via ORAL
  Filled 2023-05-29 (×4): qty 1

## 2023-05-29 MED ORDER — HYDRALAZINE HCL 20 MG/ML IJ SOLN
10.0000 mg | INTRAMUSCULAR | Status: DC | PRN
  Administered 2023-05-29: 10 mg via INTRAVENOUS
  Filled 2023-05-29: qty 1

## 2023-05-29 MED ORDER — IOHEXOL 300 MG/ML  SOLN
75.0000 mL | Freq: Once | INTRAMUSCULAR | Status: AC | PRN
  Administered 2023-05-29: 75 mL via INTRAVENOUS

## 2023-05-29 MED ORDER — PIPERACILLIN-TAZOBACTAM 3.375 G IVPB 30 MIN
3.3750 g | Freq: Once | INTRAVENOUS | Status: AC
  Administered 2023-05-29: 3.375 g via INTRAVENOUS
  Filled 2023-05-29: qty 50

## 2023-05-29 MED ORDER — CHLORHEXIDINE GLUCONATE CLOTH 2 % EX PADS
6.0000 | MEDICATED_PAD | Freq: Every day | CUTANEOUS | Status: DC
  Administered 2023-05-29 – 2023-05-31 (×3): 6 via TOPICAL

## 2023-05-29 MED ORDER — ACETAMINOPHEN 325 MG PO TABS: 650.0000 mg | ORAL_TABLET | Freq: Four times a day (QID) | ORAL | Status: DC | PRN

## 2023-05-29 MED ORDER — SENNA 8.6 MG PO TABS
2.0000 | ORAL_TABLET | Freq: Every day | ORAL | Status: DC
  Administered 2023-05-29 – 2023-05-31 (×3): 17.2 mg via ORAL
  Filled 2023-05-29 (×3): qty 2

## 2023-05-29 NOTE — Evaluation (Signed)
 Occupational Therapy Evaluation Patient Details Name: Terrance Miller MRN: [id number] DOB: 10-Dec-1928 Today's Date: 05/29/2023   History of Present Illness   Sloan Takagi is a 89 y.o. male with medical history significant of dementia, hyperlipidemia, hypertension and coronary artery disease who presented with hypothermia and altered mental status.       All information is from his daughter at the bedside, patient not able to give any detailed history due to cognitive impairment.   Recent hospitalization 04/28 to 05/12/22 for septic shock due to stercoral colitis. He was discharged back to SNF to continue physical therapy.   Apparently at SNF his overall health has been slowly declining, he has showed signs of cognitive impairment and continue to have ambulatory dysfunction. He uses a walker for ambulation.      Today he was noted to be more confused than his baseline by his daughter when she called to him over the phone at the nursing home.   Later today she was called about her father having hypothermia, hypotensive and being transferred to the hospital. When EMS arrived his blood pressure was 130/60, he was very weak and deconditioned, then transferred to the ED for further evaluation. (per MD)     Clinical Impressions Pt agreeable to OT and PT co-evaluation. Pt presents with flat affect and minimal verbal communication. Pt mostly grunting but was able to verbalize some. Pt required max A for bed mobility and mod A for ambulation and transfer with RW. Pt was left on the [facility] due to having a bowel movement while standing. Pt demonstrates very limited  BUE A/ROM/strength and much assist needed for ADL's at this time. Pt left on the [facility] with NT in the room. Pt will benefit from continued OT in the hospital and recommended venue below to increase strength, balance, and endurance for safe ADL's.        If plan is discharge home, recommend the following:   A lot of help with walking and/or transfers;A  lot of help with bathing/dressing/bathroom;Assistance with cooking/housework;Assistance with feeding;Assist for transportation;Help with stairs or ramp for entrance;Direct supervision/assist for medications management     Functional Status Assessment   Patient has had a recent decline in their functional status and demonstrates the ability to make significant improvements in function in a reasonable and predictable amount of time.     Equipment Recommendations   None recommended by OT             Precautions/Restrictions   Precautions Precautions: Fall Recall of Precautions/Restrictions: Intact Restrictions Weight Bearing Restrictions Per Provider Order: No     Mobility Bed Mobility Overal bed mobility: Needs Assistance Bed Mobility: Supine to Sit     Supine to sit: Max assist, HOB elevated     General bed mobility comments: Labored movement; much assist for B LE and trunk movement.    Transfers Overall transfer level: Needs assistance Equipment used: Rolling walker (2 wheels) Transfers: Sit to/from Stand, Bed to chair/wheelchair/BSC Sit to Stand: Mod assist     Step pivot transfers: Mod assist     General transfer comment: slow labored movement; unsteady; much prompting to bend knees to sit in the [facility] ; plopping into chair when attempted.      Balance Overall balance assessment: Needs assistance Sitting-balance support: Bilateral upper extremity supported, Feet supported Sitting balance-Leahy Scale: Fair Sitting balance - Comments: seated EOB   Standing balance support: Reliant on assistive device for balance, During functional activity, Bilateral upper extremity supported Standing balance-Leahy Scale:  Poor Standing balance comment: poor to fair with RW                           ADL either performed or assessed with clinical judgement   ADL Overall ADL's : Needs assistance/impaired Eating/Feeding: Maximal assistance;Moderate  assistance;Sitting   Grooming: Moderate assistance;Maximal assistance;Sitting   Upper Body Bathing: Maximal assistance;Sitting   Lower Body Bathing: Maximal assistance;Total assistance;Bed level   Upper Body Dressing : Moderate assistance;Maximal assistance;Sitting   Lower Body Dressing: Maximal assistance;Total assistance;Sitting/lateral leans   Toilet Transfer: Rolling walker (2 wheels);Moderate assistance;Stand-pivot;BSC/3in1 Statistician Details (indicate cue type and reason): Chair to [facility] with RW Toileting- Clothing Manipulation and Hygiene: Total assistance;Bed level       Functional mobility during ADLs: Moderate assistance;Rolling walker (2 wheels) General ADL Comments: Ambulated a short distance in the room with RW.     Vision Baseline Vision/History:  (unsure; pt poor historian) Vision Assessment?: No apparent visual deficits Additional Comments: Continue to observe and assess.     Perception Perception: Not tested       Praxis Praxis: Not tested       Pertinent Vitals/Pain Pain Assessment Pain Assessment: Faces Faces Pain Scale: No hurt     Extremity/Trunk Assessment Upper Extremity Assessment Upper Extremity Assessment: Generalized weakness (2-/5 B UE when tested reclined in bed; assist to bring B UE to RW; good composit grip strength. Near 75% of available P/ROM bilateral shoulder flexion.)   Lower Extremity Assessment Lower Extremity Assessment: Defer to PT evaluation   Cervical / Trunk Assessment Cervical / Trunk Assessment: Normal   Communication Communication Communication: Impaired Factors Affecting Communication: Difficulty expressing self   Cognition Arousal: Alert Behavior During Therapy: Flat affect Cognition: History of cognitive impairments             OT - Cognition Comments: Impaired at baseline; pt mostly grunting and moaning with minimal verbal communication.                 Following commands: Impaired Following  commands impaired: Follows one step commands with increased time     Cueing  General Comments   Cueing Techniques: Verbal cues;Tactile cues                 Home Living Family/patient expects to be discharged to:: Skilled nursing facility                                        Prior Functioning/Environment Prior Level of Function : Needs assist  Cognitive Assist : Mobility (cognitive)     Physical Assist : Mobility (physical);ADLs (physical) Mobility (physical): Transfers;Gait;Bed mobility;Stairs ADLs (physical): IADLs;Dressing;Bathing Mobility Comments: household ambulator using RW. pt reports assist from daughter and son in law that pt given assist for sit/stand transfers and ambulates independently with RW. (per chart; pt is a poor historian; unsure of mobility at SNF) ADLs Comments: Assisted with bathing and dressing per pt's reports. Chart review indicates assist for IADL's as well. Pt confirms. (per chart; unsure of level of assist at SNF)    OT Problem List: Decreased strength;Decreased range of motion;Decreased activity tolerance;Impaired balance (sitting and/or standing);Decreased cognition;Decreased safety awareness;Decreased knowledge of use of DME or AE;Impaired UE functional use   OT Treatment/Interventions: Self-care/ADL training;Therapeutic exercise;DME and/or AE instruction;Therapeutic activities;Patient/family education;Balance training;Cognitive remediation/compensation;Energy conservation      OT Goals(Current goals can be found  in the care plan section)   Acute Rehab OT Goals Patient Stated Goal: Seemingly agreeable to return to SNF OT Goal Formulation: With patient Time For Goal Achievement: 06/12/23 Potential to Achieve Goals: Good   OT Frequency:  Min 2X/week    Co-evaluation PT/OT/SLP Co-Evaluation/Treatment: Yes Reason for Co-Treatment: To address functional/ADL transfers   OT goals addressed during session: ADL's and  self-care      AM-PAC OT "6 Clicks" Daily Activity     Outcome Measure Help from another person eating meals?: A Lot Help from another person taking care of personal grooming?: A Lot Help from another person toileting, which includes using toliet, bedpan, or urinal?: Total Help from another person bathing (including washing, rinsing, drying)?: A Lot Help from another person to put on and taking off regular upper body clothing?: A Lot Help from another person to put on and taking off regular lower body clothing?: A Lot 6 Click Score: 11   End of Session Equipment Utilized During Treatment: Rolling walker (2 wheels) Nurse Communication: Other (comment) (Notified NT that the pt would need assist for peri-care on the [facility].)  Activity Tolerance: Patient tolerated treatment well Patient left: Other (comment) (Pt left on the [facility] with NT in the room to assist.)  OT Visit Diagnosis: Unsteadiness on feet (R26.81);Other abnormalities of gait and mobility (R26.89);Muscle weakness (generalized) (M62.81);Other symptoms and signs involving cognitive function                Time: 4098-1191 OT Time Calculation (min): 24 min Charges:  OT General Charges $OT Visit: 1 Visit OT Evaluation $OT Eval Low Complexity: 1 Low  Lota Leamer OT, MOT  Thurnell Floss 05/29/2023, 1:32 PM

## 2023-05-29 NOTE — Evaluation (Signed)
 Clinical/Bedside Swallow Evaluation Patient Details  Name: Terrance Miller MRN: [id number] Date of Birth: 1928/01/09  Today's Date: 05/29/2023 Time: SLP Start Time (ACUTE ONLY): 1435 SLP Stop Time (ACUTE ONLY): 1454 SLP Time Calculation (min) (ACUTE ONLY): 19 min  Past Medical History:  Past Medical History:  Diagnosis Date   Acute ST elevation myocardial infarction (STEMI) of inferior wall (HCC) 2004   Coronary artery disease    DES x2 to the RCA 2004, residual disease managed medically   Hyperlipidemia    Hypertension    Type 2 diabetes mellitus (HCC)    Past Surgical History:  Past Surgical History:  Procedure Laterality Date   BIOPSY  01/28/2022   Procedure: BIOPSY;  Surgeon: Umberto Ganong, Bearl Limes, MD;  Location: [facility];  Service: Gastroenterology;;   CATARACT EXTRACTION Bilateral    CORONARY ANGIOPLASTY WITH STENT PLACEMENT  09/05/2002   stent RCA, 80% first diagonal, 70-80% mid-diagonal, 80% mid LAD stenosis   FLEXIBLE SIGMOIDOSCOPY N/A 01/28/2022   Procedure: FLEXIBLE SIGMOIDOSCOPY;  Surgeon: Urban Garden, MD;  Location: [facility];  Service: Gastroenterology;  Laterality: N/A;   HYDROCELE EXCISION Bilateral 11/20/2021   Procedure: HYDROCELECTOMY ADULT;  Surgeon: Mellie Sprinkle., MD;  Location: [facility];  Service: Urology;  Laterality: Bilateral;   IMPACTION REMOVAL  01/28/2022   Procedure: IMPACTION REMOVAL;  Surgeon: Urban Garden, MD;  [facility]: [facility];  Service: Gastroenterology;;   NM MYOCAR PERF WALL MOTION  11/01/2008   Normal   HPI:  [name] is a 89 year old male who resides at SNF, presented to ED with hypothermia and altered mental status. Chest XR and CT head unremarkable for acute process.    Assessment / Plan / Recommendation  Clinical Impression  Clinical swallow evaluation limited 2/2 pt particpation and refusal of PO trials. Unable to complete OME 2/2 mentation. Chart review reveals previosuly on  modified diet (dys 3). Pt did not eat any lunch tray at bedside. With max encouragement, pt consumes thin liquid, puree, soft solid. No overt s/sx aspiration noted for any consistancy. Baseline cough, prior to PO, soudns congested. He demonstrates mild oral delay and oral residuals which cleared with liquids wash. Primary barrier to PO intake appears to be mentation and alertness. Recommend dysphagia 3 solids with total-A for feeding. Ensure pt is alert for all PO. General aspiration precautions, including daily oral care. ST will follow for tolerance. SLP Visit Diagnosis: Dysphagia, oropharyngeal phase (R13.12);Dysphagia, unspecified (R13.10)    Aspiration Risk  Moderate aspiration risk    Diet Recommendation Dysphagia 3 (Mech soft);Thin liquid    Liquid Administration via: Cup;Straw Medication Administration: Crushed with puree Supervision: Staff to assist with self feeding Postural Changes: Seated upright at 90 degrees    Other  Recommendations Oral Care Recommendations: Oral care BID    Recommendations for follow up therapy are one component of a multi-disciplinary discharge planning process, led by the attending physician.  Recommendations may be updated based on patient status, additional functional criteria and insurance authorization.  Follow up Recommendations   TBD     Assistance Recommended at Discharge   Full assistance   Functional Status Assessment Patient has had a recent decline in their functional status and demonstrates the ability to make significant improvements in function in a reasonable and predictable amount of time.  Frequency and Duration min 1 x/week  1 week       Prognosis Prognosis for improved oropharyngeal function: Fair Barriers to Reach Goals: Cognitive deficits  Swallow Study   General Date of Onset: 05/28/23 HPI: [name] is a 89 year old male who resides at SNF, presented to ED with hypothermia and altered mental status. Chest XR and  CT head unremarkable for acute process. Type of Study: Bedside Swallow Evaluation Previous Swallow Assessment: Dec 2024 rx dys 3 and thin Diet Prior to this Study: Regular;Thin liquids (Level 0) Temperature Spikes Noted: No Respiratory Status: Room air History of Recent Intubation: No Behavior/Cognition: Lethargic/Drowsy;Requires cueing Oral Cavity Assessment:  (unable to assess) Oral Care Completed by SLP: Yes Oral Cavity - Dentition: Edentulous (few natural teeth) Self-Feeding Abilities: Total assist Patient Positioning: Upright in bed Baseline Vocal Quality: Low vocal intensity Volitional Cough: Cognitively unable to elicit Volitional Swallow: Unable to elicit    Oral/Motor/Sensory Function Overall Oral Motor/Sensory Function: Other (comment)   Ice Chips Ice chips: Not tested   Thin Liquid Thin Liquid: Within functional limits Presentation: Straw    Nectar Thick   Did not test  Honey Thick   Did not test  Puree Puree: Within functional limits Presentation: Spoon   Solid     Solid: Within functional limits Presentation: Pamila Boers 05/29/2023,2:55 PM

## 2023-05-29 NOTE — Plan of Care (Signed)
  Problem: Acute Rehab OT Goals (only OT should resolve) Goal: Pt. Will Perform Grooming Flowsheets (Taken 05/29/2023 1337) Pt Will Perform Grooming: with min assist Goal: Pt. Will Perform Upper Body Dressing Flowsheets (Taken 05/29/2023 1337) Pt Will Perform Upper Body Dressing: with min assist Goal: Pt. Will Perform Lower Body Dressing Flowsheets (Taken 05/29/2023 1337) Pt Will Perform Lower Body Dressing:  with mod assist  bed level Goal: Pt. Will Transfer To Toilet Flowsheets (Taken 05/29/2023 1337) Pt Will Transfer to Toilet:  with contact guard assist  ambulating Goal: Pt. Will Perform Toileting-Clothing Manipulation Flowsheets (Taken 05/29/2023 1337) Pt Will Perform Toileting - Clothing Manipulation and hygiene:  with mod assist  sitting/lateral leans Goal: Pt/Caregiver Will Perform Home Exercise Program Flowsheets (Taken 05/29/2023 1337) Pt/caregiver will Perform Home Exercise Program:  Increased strength  Increased ROM  Both right and left upper extremity  With minimal assist  Lisbeth Puller OT, MOT

## 2023-05-29 NOTE — Assessment & Plan Note (Signed)
 Hold on antihypertensive agents due to risk of hypotension  As outpatient patient has been on carvedilol , furosemide , and amlodipine .

## 2023-05-29 NOTE — ED Provider Notes (Signed)
 Patient signed out pending CT and urinalysis.  Urinalysis without evidence of UTI.  CT scan is largely unremarkable with exception of thickening of the colon consistent with inflammatory infectious proctitis.  Was admitted for stercoral colitis at the end of April.  Could be sequelae from this.  Remains slightly hypothermic at 96.2 under her Bair hugger.  Spoke with daughter.  She is just concerned about his persistent hypothermia.  He does have a history of diabetes but his blood sugars here have been normal.  No history of thyroid disease.  Will discuss with hospitalist for obs admission.  Will hold off on antibiotics at this time as proctitis is likely inflammatory.  He has no white count and does not appear septic.   Rory Collard, MD 05/29/23 [phone]
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
## 2023-05-29 NOTE — Assessment & Plan Note (Signed)
 Continue IV fluids with isotonic saline Follow up renal function and electrolytes Avoid hypotension or nephrotoxic medications

## 2023-05-29 NOTE — TOC Initial Note (Signed)
 Transition of Care [facility]) - Initial/Assessment Note    Patient Details  Name: Terrance Miller MRN: [id number] Date of Birth: November 08, 1928  [facility] CM/SW Contact:    Ander Katos, LCSW Phone Number: 05/29/2023, 8:41 AM  Clinical Narrative: Pt well known to [facility] from previous admissions. He is a resident at [facility]. LCSW confirmed with daughter plan is to return to [facility] when medically stable. Ok for return per [place] at [facility]. Will need new authorization if pt does not d/c today. PT evaluation pending. TOC will follow.                    Expected Discharge Plan: Skilled Nursing Facility Barriers to Discharge: Continued Medical Work up   Patient Goals and CMS Choice Patient states their goals for this hospitalization and ongoing recovery are:: return to SNF     [facility] ownership interest in [facility].provided to::  (n/a)    Expected Discharge Plan and Services In-house Referral: Clinical Social Work   Post Acute Care Choice: Skilled Nursing Facility Living arrangements for the past 2 months: Skilled Nursing Facility                                      Prior Living Arrangements/Services Living arrangements for the past 2 months: Skilled Nursing Facility Lives with:: Facility Resident Patient language and need for interpreter reviewed:: Yes Do you feel safe going back to the place where you live?: Yes      Need for Family Participation in Patient Care: Yes (Comment) Care giver support system in place?: Yes (comment)   Criminal Activity/Legal Involvement Pertinent to Current Situation/Hospitalization: No - Comment as needed  Activities of Daily Living   ADL Screening (condition at time of admission) Independently performs ADLs?: No Does the patient have a NEW difficulty with bathing/dressing/toileting/self-feeding that is expected to last >3 days?: No Does the patient have a NEW difficulty with getting in/out of  bed, walking, or climbing stairs that is expected to last >3 days?: No Does the patient have a NEW difficulty with communication that is expected to last >3 days?: No Is the patient deaf or have difficulty hearing?: Yes Does the patient have difficulty seeing, even when wearing glasses/contacts?: No Does the patient have difficulty concentrating, remembering, or making decisions?: Yes  Permission Sought/Granted         Permission granted to share info w AGENCY: [facility]  Permission granted to share info w Relationship: SNF     Emotional Assessment       Orientation: : Oriented to Self, Oriented to Place Alcohol / Substance Use: Not Applicable Psych Involvement: No (comment)  Admission diagnosis:  Hypothermia [T68.XXXA] Hypothermia, initial encounter [T68.XXXA] Patient Active Problem List   Diagnosis Date Noted   Dementia with behavioral disturbance (HCC) 05/29/2023   Acute on chronic heart failure with preserved ejection fraction (HFpEF) (HCC) 05/10/2023   Stercoral colitis 05/04/2023   (HFpEF) heart failure with preserved ejection fraction (HCC) 05/04/2023   Nausea with vomiting 05/04/2023   DNR (do not resuscitate) 05/04/2023   FTT (failure to thrive) in adult 05/04/2023   UTI (urinary tract infection) 04/21/2023   Thrombocytopenia (HCC) 04/21/2023   Acute on chronic diastolic CHF (congestive heart failure) (HCC) 04/21/2023   Obesity, Class III, BMI 40-49.9 (morbid obesity) 04/21/2023   Injury to scrotum, penis, or foreskin 03/18/2023   Type 2  diabetes mellitus with chronic kidney disease, with long-term current use of insulin  (HCC) 12/20/2022   Glaucoma 12/20/2022   Vascular dementia without behavioral disturbance (HCC) 08/15/2022   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   Aortic atherosclerosis (HCC) 08/05/2022   Chronic constipation 08/05/2022   Increased intraocular pressure, bilateral 08/05/2022   Edema, peripheral 08/05/2022   Aortic  regurgitation 03/17/2022   Pulmonary HTN (HCC) 03/17/2022   Dry skin dermatitis 02/05/2022   Stage 3b chronic kidney disease (HCC) 11/05/2021   MGUS (monoclonal gammopathy of unknown significance) 11/05/2021   Acute metabolic encephalopathy 07/27/2021   Macrocytic anemia 05/31/2021   History of MI (myocardial infarction) 04/29/2021   Hypothermia 05/20/2017   Severe sepsis with septic shock (HCC) 05/20/2017   Essential hypertension 09/28/2013   Type 2 diabetes mellitus with hyperlipidemia (HCC) 09/28/2013   CAD (coronary artery disease) 09/28/2013   PCP:  System, Provider Not In Pharmacy:   [facility] - [place], [place] - [address] [place] [address] Phone: [phone] Fax: [phone]  [facility] - [place], [place] - [address] [place] [address] Phone: [phone] Fax: [phone]     Social Drivers of Health (SDOH) Social History: SDOH Screenings   Food Insecurity: No Food Insecurity (05/29/2023)  Housing: Low Risk  (05/29/2023)  Transportation Needs: No Transportation Needs (05/29/2023)  Utilities: Not At Risk (05/29/2023)  Alcohol Screen: Low Risk  (01/05/2023)  Depression (PHQ2-9): Medium Risk (03/17/2023)  Financial Resource Strain: Low Risk  (01/05/2023)  Physical Activity: Inactive (01/05/2023)  Social Connections: Unknown (05/29/2023)  Recent Concern: Social Connections - Socially Isolated (05/04/2023)  Stress: No Stress Concern Present (01/05/2023)  Tobacco Use: Medium Risk (05/29/2023)  Health Literacy: Adequate Health Literacy (01/05/2023)   SDOH Interventions:     Readmission Risk Interventions    04/24/2023    1:31 PM 04/23/2023   11:23 AM 04/22/2023   10:47 AM  Readmission Risk Prevention Plan  Transportation Screening Complete Complete Complete  HRI or Home Care Consult Complete Complete Complete  Social Work Consult for Recovery Care Planning/Counseling  Complete Complete Complete  Palliative Care Screening Complete Complete Complete  Medication Review Oceanographer) Complete Complete Complete

## 2023-05-29 NOTE — Plan of Care (Signed)
  Problem: Acute Rehab PT Goals(only PT should resolve) Goal: Pt Will Go Supine/Side To Sit Outcome: Progressing Flowsheets (Taken 05/29/2023 1435) Pt will go Supine/Side to Sit:  with minimal assist  with moderate assist Goal: Patient Will Transfer Sit To/From Stand Outcome: Progressing Flowsheets (Taken 05/29/2023 1435) Patient will transfer sit to/from stand:  with minimal assist  with contact guard assist Goal: Pt Will Transfer Bed To Chair/Chair To Bed Outcome: Progressing Flowsheets (Taken 05/29/2023 1435) Pt will Transfer Bed to Chair/Chair to Bed: with min assist Goal: Pt Will Ambulate Outcome: Progressing Flowsheets (Taken 05/29/2023 1435) Pt will Ambulate:  25 feet  with minimal assist  with moderate assist  with rolling walker   2:36 PM, 05/29/23 Terrance Miller, MPT Physical Therapist with [facility] [phone] office [phone] mobile phone

## 2023-05-29 NOTE — Progress Notes (Signed)
 [facility] PROGRESS NOTE  Owin Vignola (DOB: [birth year]) WUJ:[id number] PCP: System, Provider Not In  Brief Narrative: Micholas Drumwright is a 89 y.o. male with a history of dementia, CAD, HTN, HLD who presented to the ED on 05/28/2023 from SNF with AMS and hypothermia. 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 Also admitted 4/15 - 4/21 for Enterobacter UTI  Subjective: Pt lethargic earlier per RN, hypotensive worse when attempting to stand for BM.   Objective: BP (!) 118/43   Pulse (!) 53   Temp 97.8 F (36.6 C) (Axillary)   Resp 18   Ht 6' (1.829 m)   Wt 77.7 kg   SpO2 100%   BMI 23.23 kg/m   Gen: Elderly male in no distress Pulm: Clear, nonlabored  CV: Regular borderline bradycardia, no MRG, 1+ LE edema with hypertrophic skin changes.  GI: Soft, NT, ND, +BS Neuro: Alert and not oriented or cooperative. No new focal deficits. Ext: Warm, no deformities, very good muscular strength throughout but not cooperative with exam. Skin: Scrotum scarring consistent with history of hydrocelectomy 2023, no exudate. No other/new rashes, lesions or ulcers on visualized skin   Assessment & Plan: Principal Problem:   Hypothermia Active Problems:   Essential hypertension   CAD (coronary artery disease)   Type 2 diabetes mellitus with hyperlipidemia (HCC)   Stage 3b chronic kidney disease (HCC)   Dementia with behavioral disturbance (HCC)  Proctitis:  - Continue zosyn  - Monitor blood cultures (NGTD < 12 hours)  Hypothermia: Has this associated with prior admissions as well.  - Bair hugger prn - Cortisol - Treating with abx in the case that this represents sepsis physiology.   Acute metabolic encephalopathy on chronic dementia:  - Continue namenda  - Delirium precautions.   CAD s/p DES x2 to RCA 2004 - ASA, coreg , statin  T2DM:  - SSI  Chronic HFpEF, HTN:  - With ongoing soft BP, will  continue holding norvasc , coreg  (also holding due to bradycardia), lasix  40mg   Stage IIIb CKD: Cr near baseline. Atrophic kidneys on CT. - Monitor BMP, avoid hypotension. Could use midodrine  if needed.   Thrombocytopenia: Chronic.   HLD:  - Continue rosuvastatin   BPH:  - Continue tamsulosin .   LFT elevation: Mild, acute. Cholelithiasis without obstructive changes on CT.  - Monitor  DNR: POA - With steady decline, readmission, advanced age, continued goals of care conversations remain appropriate. Palliative care consulted.   Wynetta Heckle, MD [facility] [email or web address] 05/29/2023, 12:53 PM

## 2023-05-29 NOTE — Assessment & Plan Note (Signed)
 Continue with memantine.

## 2023-05-29 NOTE — [place] FL2 (Signed)
 [place]  MEDICAID FL2 LEVEL OF CARE FORM     IDENTIFICATION  Patient Name: Terrance Miller Birthdate: January 31, 1928 Sex: male Admission Date (Current Location): 05/28/2023  [place] and [place] Number:  [name] [id number] Facility and Address:  [facility],  [address] [name] [address]      Provider Number: [id number]  Attending Physician Name and Address:  Wynetta Heckle, MD  Relative Name and Phone Number:       Current Level of Care: Hospital Recommended Level of Care: Skilled Nursing Facility Prior Approval Number:    Date Approved/Denied:   PASRR Number:    Discharge Plan: SNF    Current Diagnoses: Patient Active Problem List   Diagnosis Date Noted   Dementia with behavioral disturbance (HCC) 05/29/2023   Acute on chronic heart failure with preserved ejection fraction (HFpEF) (HCC) 05/10/2023   Stercoral colitis 05/04/2023   (HFpEF) heart failure with preserved ejection fraction (HCC) 05/04/2023   Nausea with vomiting 05/04/2023   DNR (do not resuscitate) 05/04/2023   FTT (failure to thrive) in adult 05/04/2023   UTI (urinary tract infection) 04/21/2023   Thrombocytopenia (HCC) 04/21/2023   Acute on chronic diastolic CHF (congestive heart failure) (HCC) 04/21/2023   Obesity, Class III, BMI 40-49.9 (morbid obesity) 04/21/2023   Injury to scrotum, penis, or foreskin 03/18/2023   Type 2 diabetes mellitus with chronic kidney disease, with long-term current use of insulin  (HCC) 12/20/2022   Glaucoma 12/20/2022   Vascular dementia without behavioral disturbance (HCC) 08/15/2022   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   Aortic atherosclerosis (HCC) 08/05/2022   Chronic constipation 08/05/2022   Increased intraocular pressure, bilateral 08/05/2022   Edema, peripheral 08/05/2022   Aortic regurgitation 03/17/2022   Pulmonary HTN (HCC) 03/17/2022   Dry skin dermatitis 02/05/2022   Stage 3b chronic kidney disease (HCC)  11/05/2021   MGUS (monoclonal gammopathy of unknown significance) 11/05/2021   Acute metabolic encephalopathy 07/27/2021   Macrocytic anemia 05/31/2021   History of MI (myocardial infarction) 04/29/2021   Hypothermia 05/20/2017   Severe sepsis with septic shock (HCC) 05/20/2017   Essential hypertension 09/28/2013   Type 2 diabetes mellitus with hyperlipidemia (HCC) 09/28/2013   CAD (coronary artery disease) 09/28/2013    Orientation RESPIRATION BLADDER Height & Weight     Place, Self  Normal Incontinent Weight: 171 lb 4.8 oz (77.7 kg) Height:  6' (182.9 cm)  BEHAVIORAL SYMPTOMS/MOOD NEUROLOGICAL BOWEL NUTRITION STATUS      Incontinent Diet (See d/c summary)  AMBULATORY STATUS COMMUNICATION OF NEEDS Skin   Extensive Assist Verbally Bruising, Other (Comment) (Redness to scrotum and sacrum)                       Personal Care Assistance Level of Assistance  Bathing, Feeding, Dressing Bathing Assistance: Maximum assistance Feeding assistance: Limited assistance Dressing Assistance: Maximum assistance     Functional Limitations Info  Sight, Hearing, Speech Sight Info: Impaired Hearing Info: Impaired Speech Info: Impaired    SPECIAL CARE FACTORS FREQUENCY  PT (By licensed PT)     PT Frequency: PT pending              Contractures      Additional Factors Info  Code Status, Allergies, Isolation Precautions Code Status Info: DNR- Limited Allergies Info: Clobetasol      Isolation Precautions Info: Contact precautions     Current Medications (05/29/2023):  This is the current hospital active medication list Current Facility-Administered Medications  Medication Dose Route Frequency  Provider Last Rate Last Admin   0.9 %  sodium chloride  infusion   Intravenous Continuous Arrien, Mauricio Daniel, MD 100 mL/hr at 05/29/23 0337 New Bag at 05/29/23 0337   acetaminophen  (TYLENOL ) tablet 650 mg  650 mg Oral Q6H PRN Arrien, Curlee Doss, MD       Or   acetaminophen   (TYLENOL ) suppository 650 mg  650 mg Rectal Q6H PRN Arrien, Curlee Doss, MD       albuterol  (PROVENTIL ) (2.5 MG/3ML) 0.083% nebulizer solution 2.5 mg  2.5 mg Nebulization Q6H PRN Arrien, Curlee Doss, MD       aspirin  EC tablet 81 mg  81 mg Oral Q breakfast Arrien, Curlee Doss, MD       Chlorhexidine  Gluconate Cloth 2 % PADS 6 each  6 each Topical Daily Arrien, Mauricio Daniel, MD       enoxaparin  (LOVENOX ) injection 30 mg  30 mg Subcutaneous Q24H Arrien, Curlee Doss, MD       insulin  aspart (novoLOG ) injection 0-9 Units  0-9 Units Subcutaneous TID WC Arrien, Mauricio Daniel, MD       latanoprost  (XALATAN ) 0.005 % ophthalmic solution 1 drop  1 drop Both Eyes BID Arrien, Mauricio Daniel, MD       memantine  (NAMENDA ) tablet 5 mg  5 mg Oral Daily Arrien, Mauricio Daniel, MD       ondansetron  (ZOFRAN ) tablet 4 mg  4 mg Oral Q6H PRN Arrien, Mauricio Daniel, MD       Or   ondansetron  (ZOFRAN ) injection 4 mg  4 mg Intravenous Q6H PRN Arrien, Mauricio Daniel, MD       piperacillin -tazobactam (ZOSYN ) IVPB 3.375 g  3.375 g Intravenous Q8H Bryk, Veronda P, RPH       polyethylene glycol (MIRALAX  / GLYCOLAX ) packet 17 g  17 g Oral BID Arrien, Mauricio Daniel, MD       rosuvastatin  (CRESTOR ) tablet 5 mg  5 mg Oral Daily Arrien, Mauricio Daniel, MD       senna (SENOKOT) tablet 17.2 mg  2 tablet Oral QHS Arrien, Curlee Doss, MD       tamsulosin  (FLOMAX ) capsule 0.4 mg  0.4 mg Oral Daily Arrien, Mauricio Daniel, MD         Discharge Medications: Please see discharge summary for a list of discharge medications.  Relevant Imaging Results:  Relevant Lab Results:   Additional Information SS# [id number]  Ander Katos, [place]

## 2023-05-29 NOTE — Assessment & Plan Note (Signed)
 CT with proctitis, complicated with acute metabolic encephalopathy.   Plan to continue supportive medical care with IV fluids and warming blanket.  Continue neuro checks per unit protocol, fall and aspiration precautions.  Will add antibiotic therapy with Zosyn .  Follow up cell count cultures and temperature curve.

## 2023-05-29 NOTE — ED Notes (Signed)
 Patient transported to CT

## 2023-05-29 NOTE — Assessment & Plan Note (Addendum)
Continue glucose cover and monitoring with insulin sliding scale. Continue statin therapy.  

## 2023-05-29 NOTE — H&P (Signed)
 History and Physical    Patient: Terrance Miller WUJ:[id number] DOB: 1928-05-10 DOA: 05/28/2023 DOS: the patient was seen and examined on 05/29/2023 PCP: System, Provider Not In  Patient coming from: SNF  Chief Complaint:  Chief Complaint  Patient presents with   Weakness   Altered Mental Status   HPI: Terrance Miller is a 89 y.o. male with medical history significant of dementia, hyperlipidemia, hypertension and coronary artery disease who presented with hypothermia and altered mental status.    All information is from his daughter at the bedside, patient not able to give any detailed history due to cognitive impairment.  Recent hospitalization 04/28 to 05/12/22 for septic shock due to stercoral colitis. He was discharged back to SNF to continue physical therapy.  Apparently at SNF his overall health has been slowly declining, he has showed signs of cognitive impairment and continue to have ambulatory dysfunction. He uses a walker for ambulation.   Today he was noted to be more confused than his baseline by his daughter when she called to him over the phone at the nursing home.  Later today she was called about her father having hypothermia, hypotensive and being transferred to the hospital. When EMS arrived his blood pressure was 130/60, he was very weak and deconditioned, then transferred to the ED for further evaluation.    Review of Systems: unable to review all systems due to the inability of the patient to answer questions. Past Medical History:  Diagnosis Date   Acute ST elevation myocardial infarction (STEMI) of inferior wall (HCC) 2004   Coronary artery disease    DES x2 to the RCA 2004, residual disease managed medically   Hyperlipidemia    Hypertension    Type 2 diabetes mellitus (HCC)    Past Surgical History:  Procedure Laterality Date   BIOPSY  01/28/2022   Procedure: BIOPSY;  Surgeon: Umberto Ganong, Bearl Limes, MD;  Location: [facility];  Service: Gastroenterology;;    CATARACT EXTRACTION Bilateral    CORONARY ANGIOPLASTY WITH STENT PLACEMENT  09/05/2002   stent RCA, 80% first diagonal, 70-80% mid-diagonal, 80% mid LAD stenosis   FLEXIBLE SIGMOIDOSCOPY N/A 01/28/2022   Procedure: FLEXIBLE SIGMOIDOSCOPY;  Surgeon: Urban Garden, MD;  Location: [facility];  Service: Gastroenterology;  Laterality: N/A;   HYDROCELE EXCISION Bilateral 11/20/2021   Procedure: HYDROCELECTOMY ADULT;  Surgeon: Mellie Sprinkle., MD;  Location: [facility];  Service: Urology;  Laterality: Bilateral;   IMPACTION REMOVAL  01/28/2022   Procedure: IMPACTION REMOVAL;  Surgeon: Urban Garden, MD;  Location: [facility];  Service: Gastroenterology;;   NM MYOCAR PERF WALL MOTION  11/01/2008   Normal   Social History:  reports that he quit smoking about 17 years ago. His smoking use included cigarettes. He has been exposed to tobacco smoke. He has never used smokeless tobacco. He reports that he does not currently use alcohol. He reports that he does not use drugs.  Allergies  Allergen Reactions   Clobetasol  Other (See Comments)    Unknown  No reaction listed on MAR    Family History  Problem Relation Age of Onset   Hypertension Father    Diabetes Sister     Prior to Admission medications   Medication Sig Start Date End Date Taking? Authorizing Provider  acetaminophen  (TYLENOL ) 325 MG tablet Take 2 tablets (650 mg total) by mouth every 6 (six) hours as needed for mild pain (pain score 1-3) (or Fever >/= 101). Patient taking differently: Take 650 mg by mouth  every 6 (six) hours as needed for mild pain (pain score 1-3) (or Fever >/= 101). Do not exceed 3 g in 24 hours 04/27/23   Colin Dawley, MD  albuterol  (PROVENTIL ) (2.5 MG/3ML) 0.083% nebulizer solution Take 2.5 mg by nebulization every 6 (six) hours as needed for wheezing or shortness of breath.    [provider]  amLODipine  (NORVASC ) 10 MG tablet Take 1 tablet (10 mg total) by mouth daily.  04/27/23 04/26/24  Colin Dawley, MD  aspirin  EC 81 MG tablet Take 1 tablet (81 mg total) by mouth daily with breakfast. Swallow whole. 04/27/23   Colin Dawley, MD  bisacodyl  (DULCOLAX) 10 MG suppository Place 1 suppository (10 mg total) rectally every Monday, Wednesday, and Friday. Patient taking differently: Place 10 mg rectally 3 (three) times a week. Monday, Wednesday, and Friday 01/31/22   Colin Dawley, MD  carvedilol  (COREG ) 3.125 MG tablet Take 1 tablet (3.125 mg total) by mouth 2 (two) times daily. 04/27/23   Colin Dawley, MD  cholecalciferol  (VITAMIN D ) 1000 units tablet Take 1,000 Units by mouth daily.    [provider]  cyanocobalamin  (VITAMIN B12) 1000 MCG tablet Take 1,000 mcg by mouth daily.    [provider]  folic acid  (FOLVITE ) 1 MG tablet Take 1 tablet (1 mg total) by mouth daily. 03/17/23   Cook, [name], DO  furosemide  (LASIX ) 40 MG tablet Take 1 tablet (40 mg total) by mouth daily. 05/13/23   Demaris Fillers, MD  latanoprost  (XALATAN ) 0.005 % ophthalmic solution Place 1 drop into both eyes 2 (two) times daily. 08/20/22   Marilyne Shu, NP  melatonin 3 MG TABS tablet Take 3 mg by mouth at bedtime.    [provider]  memantine  (NAMENDA ) 5 MG tablet Take 5 mg by mouth daily.    [provider]  polyethylene glycol (MIRALAX  / GLYCOLAX ) 17 g packet Take 17 g by mouth 2 (two) times daily. 01/31/22   Colin Dawley, MD  rosuvastatin  (CRESTOR ) 5 MG tablet Take 5 mg by mouth daily.    [provider]  senna (SENOKOT) 8.6 MG TABS tablet Take 2 tablets (17.2 mg total) by mouth at bedtime. 04/27/23   Colin Dawley, MD  tamsulosin  (FLOMAX ) 0.4 MG CAPS capsule Take 1 capsule (0.4 mg total) by mouth daily. 08/20/22   Marilyne Shu, NP    Physical Exam: Vitals:   05/29/23 0030 05/29/23 0154 05/29/23 0200 05/29/23 0230  BP: (!) 118/50 (!) 119/54 (!) 108/57 (!) 125/50  Pulse: (!) 57 (!) 56 61 66  Resp: 14 15 18 17   Temp:       TempSrc:      SpO2: 94% 95% 97% 96%  Weight:      Height:       Neurology awake, incoherent speech, not following commands or responding to simple questions, not able to assess strength or coordination  ENT with positive pallor with no icterus, dry mucous membranes Cardiovascular with S1 and S2 present and regular with no gallops, rubs or murmurs Respiratory with decreases inspiratory effort with mild rhonchi and rales with no wheezing Abdomen with no distention, non tender.  Positive non pitting lower extremity edema  Data Reviewed:   Na 141, K 3.9 Cl 114 bicarbonate 21 glucose 121 bun 37 cr 1,59  AST 79 ALT 64  High sensitive troponin 11  Wbc 6,7 hgb 9,9 plt 78  Urine analysis SG 1,018, protein 30, negative nitrates, negative leukocytes, negative hgb   CT head with  generalized cerebral atrophy with widening of extra axial spaces and ventricular dilatation  No acute intracranial abnormalities  Marked severity right maxillary sinus, sphenoid sinus and bilateral ethmoid sinus disease.  CT abdomen and pelvis with circumferential wall thickening and perirectal inflammatory stranding keeping with an infectious or inflammatory proctitis.  Cholelithiasis.   Chest radiograph with hypoinflation with infiltrates or effusions, no cardiomegaly   EKG 48 bpm, normal axis, normal intervals, sinus rhythm with no significant ST segment changes, negative T wave lead II, III, aVF.   Assessment and Plan: * Hypothermia CT with proctitis, complicated with acute metabolic encephalopathy.   Plan to continue supportive medical care with IV fluids and warming blanket.  Continue neuro checks per unit protocol, fall and aspiration precautions.  Will add antibiotic therapy with Zosyn .  Follow up cell count cultures and temperature curve.   Essential hypertension Hold on antihypertensive agents due to risk of hypotension  As outpatient patient has been on carvedilol , furosemide , and amlodipine .   CAD  (coronary artery disease) No chest pain, no acute coronary syndrome   Type 2 diabetes mellitus with hyperlipidemia (HCC) Continue glucose cover and monitoring with insulin  sliding scale.  Continue statin therapy   Stage 3b chronic kidney disease (HCC) Continue IV fluids with isotonic saline Follow up renal function and electrolytes Avoid hypotension or nephrotoxic medications   Dementia with behavioral disturbance (HCC) Continue with memantine       Advance Care Planning:   Code Status: Limited: Do not attempt resuscitation (DNR) -DNR-LIMITED -Do Not Intubate/DNI    Consults: none   Family Communication: I spoke with patient's daughter at the bedside, we talked in detail about patient's condition, plan of care and prognosis and all questions were addressed.   Severity of Illness: The appropriate patient status for this patient is OBSERVATION. Observation status is judged to be reasonable and necessary in order to provide the required intensity of service to ensure the patient's safety. The patient's presenting symptoms, physical exam findings, and initial radiographic and laboratory data in the context of their medical condition is felt to place them at decreased risk for further clinical deterioration. Furthermore, it is anticipated that the patient will be medically stable for discharge from the hospital within 2 midnights of admission.   Author: Albertus Alt, MD 05/29/2023 2:42 AM  For on call review [email or web address].

## 2023-05-29 NOTE — Assessment & Plan Note (Signed)
No chest pain, no acute coronary syndrome.  

## 2023-05-29 NOTE — Evaluation (Signed)
 Physical Therapy Evaluation Patient Details Name: Terrance Miller MRN: [id number] DOB: November 14, 1928 Today's Date: 05/29/2023  History of Present Illness  Terrance Miller is a 89 y.o. male with medical history significant of dementia, hyperlipidemia, hypertension and coronary artery disease who presented with hypothermia and altered mental status.       All information is from his daughter at the bedside, patient not able to give any detailed history due to cognitive impairment.   Recent hospitalization 04/28 to 05/12/22 for septic shock due to stercoral colitis. He was discharged back to SNF to continue physical therapy.   Apparently at SNF his overall health has been slowly declining, he has showed signs of cognitive impairment and continue to have ambulatory dysfunction. He uses a walker for ambulation.      Today he was noted to be more confused than his baseline by his daughter when she called to him over the phone at the nursing home.   Later today she was called about her father having hypothermia, hypotensive and being transferred to the hospital. When EMS arrived his blood pressure was 130/60, he was very weak and deconditioned, then transferred to the ED for further evaluation.   Clinical Impression  Patient demonstrates slow labored movement for sitting up at bedside, fair/good return for completing sit to stands, limited to a few steps forward/backwards at bedside before having to sit due to fatigue and incontinent of stool. Patient tolerated sitting up on [facility] after therapy with nursing staff assisting. Patient will benefit from continued skilled physical therapy in hospital and recommended venue below to increase strength, balance, endurance for safe ADLs and gait.         If plan is discharge home, recommend the following: A lot of help with bathing/dressing/bathroom;A lot of help with walking and/or transfers;Help with stairs or ramp for entrance;Assistance with cooking/housework   Can travel  by private vehicle   No    Equipment Recommendations None recommended by PT  Recommendations for Other Services       Functional Status Assessment Patient has had a recent decline in their functional status and demonstrates the ability to make significant improvements in function in a reasonable and predictable amount of time.     Precautions / Restrictions Precautions Precautions: Fall Recall of Precautions/Restrictions: Intact Restrictions Weight Bearing Restrictions Per Provider Order: No      Mobility  Bed Mobility Overal bed mobility: Needs Assistance Bed Mobility: Supine to Sit     Supine to sit: Max assist, HOB elevated     General bed mobility comments: increased time, labored movement    Transfers Overall transfer level: Needs assistance Equipment used: Rolling walker (2 wheels) Transfers: Sit to/from Stand, Bed to chair/wheelchair/BSC Sit to Stand: Mod assist   Step pivot transfers: Mod assist       General transfer comment: slow labored movement, requires repeated verbal/tactile cueing for following directions    Ambulation/Gait   Gait Distance (Feet): 8 Feet Assistive device: Rolling walker (2 wheels) Gait Pattern/deviations: Decreased step length - right, Decreased step length - left, Decreased stride length Gait velocity: slow     General Gait Details: limited to a few steps forward/backwads at bedside before having to sit due to c/o fatigue/weakness  Stairs            Wheelchair Mobility     Tilt Bed    Modified Rankin (Stroke Patients Only)       Balance Overall balance assessment: Needs assistance Sitting-balance support: Feet supported, No upper  extremity supported Sitting balance-Leahy Scale: Fair Sitting balance - Comments: seated EOB   Standing balance support: Reliant on assistive device for balance, During functional activity, Bilateral upper extremity supported Standing balance-Leahy Scale: Poor Standing balance  comment: fair/poor using RW                             Pertinent Vitals/Pain Pain Assessment Pain Assessment: No/denies pain    Home Living Family/patient expects to be discharged to:: Skilled nursing facility Living Arrangements: Children Available Help at Discharge: Family;Available 24 hours/day Type of Home: House Home Access: Stairs to enter Entrance Stairs-Rails: None Entrance Stairs-Number of Steps: 3 Alternate Level Stairs-Number of Steps: 4 Home Layout: Able to live on main level with bedroom/bathroom;Two level Home Equipment: Toilet riser;Cane - single point;Rolling Walker (2 wheels);Rollator (4 wheels)      Prior Function Prior Level of Function : Needs assist  Cognitive Assist : Mobility (cognitive)     Physical Assist : Mobility (physical);ADLs (physical) Mobility (physical): Transfers;Gait;Bed mobility;Stairs ADLs (physical): IADLs;Dressing;Bathing Mobility Comments: household ambulator using RW. pt reports assist from daughter and son in law that pt given assist for sit/stand transfers and ambulates independently with RW. ADLs Comments: Assisted with bathing and dressing per pt's reports. Chart review indicates assist for IADL's as well. Pt confirms.     Extremity/Trunk Assessment   Upper Extremity Assessment Upper Extremity Assessment: Defer to OT evaluation    Lower Extremity Assessment Lower Extremity Assessment: Generalized weakness    Cervical / Trunk Assessment Cervical / Trunk Assessment: Normal  Communication   Communication Communication: Impaired Factors Affecting Communication: Difficulty expressing self    Cognition Arousal: Alert Behavior During Therapy: Flat affect   PT - Cognitive impairments: History of cognitive impairments                         Following commands: Impaired Following commands impaired: Follows one step commands with increased time     Cueing Cueing Techniques: Verbal cues, Tactile cues      General Comments      Exercises     Assessment/Plan    PT Assessment Patient needs continued PT services  PT Problem List Decreased strength;Decreased activity tolerance;Decreased balance;Decreased mobility       PT Treatment Interventions DME instruction;Gait training;Stair training;Functional mobility training;Therapeutic activities;Therapeutic exercise;Balance training;Patient/family education    PT Goals (Current goals can be found in the Care Plan section)  Acute Rehab PT Goals Patient Stated Goal: return home with family to assist PT Goal Formulation: With patient Time For Goal Achievement: 06/12/23 Potential to Achieve Goals: Good    Frequency Min 3X/week     Co-evaluation PT/OT/SLP Co-Evaluation/Treatment: Yes Reason for Co-Treatment: To address functional/ADL transfers PT goals addressed during session: Mobility/safety with mobility;Balance;Proper use of DME OT goals addressed during session: ADL's and self-care       AM-PAC PT "6 Clicks" Mobility  Outcome Measure Help needed turning from your back to your side while in a flat bed without using bedrails?: A Lot Help needed moving from lying on your back to sitting on the side of a flat bed without using bedrails?: A Lot Help needed moving to and from a bed to a chair (including a wheelchair)?: A Lot Help needed standing up from a chair using your arms (e.g., wheelchair or bedside chair)?: A Lot Help needed to walk in hospital room?: A Lot Help needed climbing 3-5 steps with a railing? :  A Lot 6 Click Score: 12    End of Session   Activity Tolerance: Patient tolerated treatment well;Patient limited by fatigue Patient left: in chair;with call bell/phone within reach;with chair alarm set Nurse Communication: Mobility status PT Visit Diagnosis: Unsteadiness on feet (R26.81);Other abnormalities of gait and mobility (R26.89);Muscle weakness (generalized) (M62.81)    Time: 7846-9629 PT Time Calculation  (min) (ACUTE ONLY): 23 min   Charges:   PT Evaluation $PT Eval Moderate Complexity: 1 Mod PT Treatments $Therapeutic Activity: 23-37 mins PT General Charges $$ ACUTE PT VISIT: 1 Visit         2:32 PM, 05/29/23 Walton Guppy, MPT Physical Therapist with [facility] [phone] office [phone] mobile phone

## 2023-05-30 DIAGNOSIS — T68XXXA Hypothermia, initial encounter: Secondary | ICD-10-CM

## 2023-05-30 LAB — GLUCOSE, CAPILLARY
Glucose-Capillary: 71 mg/dL (ref 70–99)
Glucose-Capillary: 86 mg/dL (ref 70–99)
Glucose-Capillary: 76 mg/dL (ref 70–99)
Glucose-Capillary: 92 mg/dL (ref 70–99)

## 2023-05-30 LAB — CBC
HCT: 28.4 % — ABNORMAL LOW (ref 39.0–52.0)
Hemoglobin: 9.7 g/dL — ABNORMAL LOW (ref 13.0–17.0)
MCH: 34.8 pg — ABNORMAL HIGH (ref 26.0–34.0)
MCHC: 34.2 g/dL (ref 30.0–36.0)
MCV: 101.8 fL — ABNORMAL HIGH (ref 80.0–100.0)
Platelets: 61 10*3/uL — ABNORMAL LOW (ref 150–400)
RBC: 2.79 MIL/uL — ABNORMAL LOW (ref 4.22–5.81)
RDW: 22.5 % — ABNORMAL HIGH (ref 11.5–15.5)
WBC: 7.6 10*3/uL (ref 4.0–10.5)
nRBC: 0.3 % — ABNORMAL HIGH (ref 0.0–0.2)

## 2023-05-30 LAB — BASIC METABOLIC PANEL WITH GFR
Anion gap: 4 — ABNORMAL LOW (ref 5–15)
BUN: 38 mg/dL — ABNORMAL HIGH (ref 8–23)
CO2: 21 mmol/L — ABNORMAL LOW (ref 22–32)
Calcium: 9 mg/dL (ref 8.9–10.3)
Chloride: 118 mmol/L — ABNORMAL HIGH (ref 98–111)
Creatinine, Ser: 1.83 mg/dL — ABNORMAL HIGH (ref 0.61–1.24)
GFR, Estimated: 34 mL/min — ABNORMAL LOW (ref >60)
Glucose, Bld: 71 mg/dL (ref 70–99)
Potassium: 3.7 mmol/L (ref 3.5–5.1)
Sodium: 143 mmol/L (ref 135–145)

## 2023-05-30 MED ORDER — HALOPERIDOL LACTATE 5 MG/ML IJ SOLN
1.0000 mg | Freq: Four times a day (QID) | INTRAMUSCULAR | Status: DC | PRN
  Administered 2023-05-30: 1 mg via INTRAMUSCULAR
  Filled 2023-05-30: qty 1

## 2023-05-30 MED ORDER — HALOPERIDOL 0.5 MG PO TABS: 1.0000 mg | ORAL_TABLET | Freq: Four times a day (QID) | ORAL | Status: DC | PRN

## 2023-05-30 NOTE — Plan of Care (Signed)

## 2023-05-30 NOTE — Progress Notes (Signed)
 [facility] PROGRESS NOTE  Terrance Miller (DOB: 1928-12-20) WUJ:[id number] PCP: System, Provider Not In  Brief Narrative: Terrance Miller is a 89 y.o. male with a history of dementia, CAD, HTN, HLD who presented to the ED on 05/28/2023 from SNF with AMS and hypothermia. 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 Also admitted 4/15 - 4/21 for Enterobacter UTI.   Subjective: Pt not cooperative, pulling incessantly at apparati.   Objective: BP (!) 157/124   Pulse 65   Temp 97.9 F (36.6 C) (Oral)   Resp 12   Ht 6' (1.829 m)   Wt 77.7 kg   SpO2 100%   BMI 23.23 kg/m   Gen: No distress Pulm: Clear, nonlabored  CV: RRR, no MRG, borderline bradycardia.  GI: Soft, NT, ND, +BS  Neuro: Alert and disoriented, not cooperative but without new focal deficits. Ext: Warm, no deformities Skin: No new rashes, lesions or ulcers on visualized skin   Assessment & Plan: Principal Problem:   Hypothermia Active Problems:   Essential hypertension   CAD (coronary artery disease)   Type 2 diabetes mellitus with hyperlipidemia (HCC)   Stage 3b chronic kidney disease (HCC)   Dementia with behavioral disturbance (HCC)  Proctitis:  - Continue zosyn  - Monitor blood cultures (NGTD < 12 hours)  Hypothermia: Has this associated with prior admissions as well.  - Has not required external warming, ok to transfer to floor. - Cortisol has not yet been checked, ordered for AM.  - Treating with abx in the case that this represents sepsis physiology.   Acute metabolic encephalopathy on chronic dementia:  - Continue namenda  - Delirium precautions.   CAD s/p DES x2 to RCA 2004 - ASA, coreg , statin  T2DM:  - SSI  Chronic HFpEF, HTN:  - Holding norvasc , coreg  (also holding due to bradycardia), lasix  40mg , if BP remains elevated, restart norvasc .   Stage IIIb CKD: Cr near baseline. Atrophic kidneys on CT. - Monitor  BMP, avoid hypotension.    Thrombocytopenia: Chronic, slightly dropping, in setting of renal dysfunction and concomitant antiplatelet Tx, will stop lovenox  for now. SCDs ordered.   HLD:  - Continue rosuvastatin   BPH:  - Continue tamsulosin .   LFT elevation: Mild, acute. Cholelithiasis without obstructive changes on CT.  - Monitor  DNR: POA - With steady decline, readmission, advanced age, continued goals of care conversations remain appropriate. Palliative care consulted. Aim to minimize harm, so transfer to floor, DC telemetry, use delirium precautions and aim to avoid restraints of any kind.  Wynetta Heckle, MD [facility] [email or web address] 05/30/2023, 11:04 AM

## 2023-05-31 DIAGNOSIS — T68XXXA Hypothermia, initial encounter: Secondary | ICD-10-CM | POA: Diagnosis not present

## 2023-05-31 LAB — CBC WITH DIFFERENTIAL/PLATELET
Abs Immature Granulocytes: 0.01 10*3/uL (ref 0.00–0.07)
Basophils Absolute: 0 10*3/uL (ref 0.0–0.1)
Basophils Relative: 1 %
Eosinophils Absolute: 0.3 10*3/uL (ref 0.0–0.5)
Eosinophils Relative: 6 %
HCT: 24.3 % — ABNORMAL LOW (ref 39.0–52.0)
Hemoglobin: 8.3 g/dL — ABNORMAL LOW (ref 13.0–17.0)
Immature Granulocytes: 0 %
Lymphocytes Relative: 18 %
Lymphs Abs: 0.9 10*3/uL (ref 0.7–4.0)
MCH: 34.9 pg — ABNORMAL HIGH (ref 26.0–34.0)
MCHC: 34.2 g/dL (ref 30.0–36.0)
MCV: 102.1 fL — ABNORMAL HIGH (ref 80.0–100.0)
Monocytes Absolute: 0.7 10*3/uL (ref 0.1–1.0)
Monocytes Relative: 14 %
Neutro Abs: 3 10*3/uL (ref 1.7–7.7)
Neutrophils Relative %: 61 %
Platelets: 49 10*3/uL — ABNORMAL LOW (ref 150–400)
RBC: 2.38 MIL/uL — ABNORMAL LOW (ref 4.22–5.81)
RDW: 22.1 % — ABNORMAL HIGH (ref 11.5–15.5)
WBC: 4.9 10*3/uL (ref 4.0–10.5)
nRBC: 0 % (ref 0.0–0.2)

## 2023-05-31 LAB — BASIC METABOLIC PANEL WITH GFR
Anion gap: 8 (ref 5–15)
BUN: 35 mg/dL — ABNORMAL HIGH (ref 8–23)
CO2: 22 mmol/L (ref 22–32)
Calcium: 9.3 mg/dL (ref 8.9–10.3)
Chloride: 119 mmol/L — ABNORMAL HIGH (ref 98–111)
Creatinine, Ser: 1.92 mg/dL — ABNORMAL HIGH (ref 0.61–1.24)
GFR, Estimated: 32 mL/min — ABNORMAL LOW (ref >60)
Glucose, Bld: 74 mg/dL (ref 70–99)
Potassium: 3.3 mmol/L — ABNORMAL LOW (ref 3.5–5.1)
Sodium: 149 mmol/L — ABNORMAL HIGH (ref 135–145)

## 2023-05-31 LAB — GLUCOSE, CAPILLARY
Glucose-Capillary: 73 mg/dL (ref 70–99)
Glucose-Capillary: 109 mg/dL — ABNORMAL HIGH (ref 70–99)
Glucose-Capillary: 93 mg/dL (ref 70–99)
Glucose-Capillary: 87 mg/dL (ref 70–99)

## 2023-05-31 LAB — CORTISOL-AM, BLOOD: Cortisol - AM: 10.5 ug/dL (ref 6.7–22.6)

## 2023-05-31 MED ORDER — ENSURE ENLIVE PO LIQD
237.0000 mL | Freq: Two times a day (BID) | ORAL | Status: DC
  Administered 2023-05-31 – 2023-06-01 (×3): 237 mL via ORAL

## 2023-05-31 MED ORDER — AMLODIPINE BESYLATE 5 MG PO TABS
10.0000 mg | ORAL_TABLET | Freq: Every day | ORAL | Status: DC
  Administered 2023-05-31 – 2023-06-01 (×2): 10 mg via ORAL
  Filled 2023-05-31 (×2): qty 2

## 2023-05-31 NOTE — Plan of Care (Signed)
  Problem: Education: Goal: Knowledge of General Education information will improve Description: Including pain rating scale, medication(s)/side effects and non-pharmacologic comfort measures Outcome: Progressing   Problem: Clinical Measurements: Goal: Diagnostic test results will improve Outcome: Progressing   

## 2023-05-31 NOTE — Plan of Care (Signed)
  Problem: Health Behavior/Discharge Planning: Goal: Ability to manage health-related needs will improve Outcome: Progressing   Problem: Clinical Measurements: Goal: Will remain free from infection Outcome: Progressing Goal: Diagnostic test results will improve Outcome: Progressing Goal: Cardiovascular complication will be avoided Outcome: Progressing   Problem: Activity: Goal: Risk for activity intolerance will decrease Outcome: Progressing   Problem: Nutrition: Goal: Adequate nutrition will be maintained Outcome: Progressing   Problem: Coping: Goal: Level of anxiety will decrease Outcome: Progressing   Problem: Elimination: Goal: Will not experience complications related to bowel motility Outcome: Progressing Goal: Will not experience complications related to urinary retention Outcome: Progressing   Problem: Pain Managment: Goal: General experience of comfort will improve and/or be controlled Outcome: Progressing

## 2023-05-31 NOTE — Progress Notes (Signed)
 [facility] PROGRESS NOTE  Terrance Miller (DOB: 01-19-1928) JAS:[id number] PCP: System, Provider Not In  Brief Narrative: Terrance Miller is a 89 y.o. male with a history of dementia, CAD, HTN, HLD who presented to the ED on 05/28/2023 from SNF with AMS and hypothermia. 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 Also admitted 4/15 - 4/21 for Enterobacter UTI.   Subjective: Stable, not cooperative but gives one word answers. No pain, no trouble breathing, no other issues.   Objective: BP (!) 160/114 (BP Location: Right Arm)   Pulse 66   Temp (!) 97.2 F (36.2 C) (Axillary)   Resp 20   Ht 6' (1.829 m)   Wt 77.7 kg   SpO2 100%   BMI 23.23 kg/m   Gen: No distress, elderly confused male Pulm: Clear, nonlabored  CV: RRR, no MRG, no pitting edema GI: Soft, NT, ND, +BS Neuro: Alert and disoriented, MAE. Ext: Warm, no deformities Skin: No new rashes, lesions or ulcers on visualized skin   Assessment & Plan: Principal Problem:   Hypothermia Active Problems:   Essential hypertension   CAD (coronary artery disease)   Type 2 diabetes mellitus with hyperlipidemia (HCC)   Stage 3b chronic kidney disease (HCC)   Dementia with behavioral disturbance (HCC)  Proctitis:  - Continue zosyn  - Monitor blood cultures    Hypothermia: Has this associated with prior admissions as well.  - Cortisol pending.  - Recurrent lower temperatures though no other vital sign instability, WBC remains normal. Do not suspect this represents uncontrolled sepsis.  - Treating with abx in the case that this represents sepsis physiology. Blood cultures NG2D.   Acute metabolic encephalopathy on chronic dementia:  - Continue namenda  - Delirium precautions.   CAD s/p DES x2 to RCA 2004 - ASA, coreg , statin  T2DM:  - SSI  Chronic HFpEF, HTN:  - Restart norvasc , hold coreg  (also holding due to bradycardia), lasix  40mg .    Stage IIIb CKD: Cr near baseline. Atrophic kidneys on CT. - Monitor BMP (labs down this morning, needs to be couriered), avoid hypotension.    Thrombocytopenia: Chronic, continues slow downward trend.  - Using SCDs for VTE ppx. Now that plt < 50k, hold aspirin . No bleeding, fortunately.    HLD:  - Continue rosuvastatin   BPH:  - Continue tamsulosin .   LFT elevation: Mild, acute. Cholelithiasis without obstructive changes on CT.  - Monitor  DNR: POA - With steady decline, readmission, advanced age, continued goals of care conversations remain appropriate. Palliative care consulted. Aim to minimize harm, so transfer to floor, DC telemetry, use delirium precautions and aim to avoid restraints of any kind.  Wynetta Heckle, MD [facility] [email or web address] 05/31/2023, 9:47 AM

## 2023-06-01 DIAGNOSIS — T68XXXA Hypothermia, initial encounter: Secondary | ICD-10-CM | POA: Diagnosis not present

## 2023-06-01 LAB — CBC
HCT: 24.7 % — ABNORMAL LOW (ref 39.0–52.0)
Hemoglobin: 8.1 g/dL — ABNORMAL LOW (ref 13.0–17.0)
MCH: 33.3 pg (ref 26.0–34.0)
MCHC: 32.8 g/dL (ref 30.0–36.0)
MCV: 101.6 fL — ABNORMAL HIGH (ref 80.0–100.0)
Platelets: 56 10*3/uL — ABNORMAL LOW (ref 150–400)
RBC: 2.43 MIL/uL — ABNORMAL LOW (ref 4.22–5.81)
RDW: 22.5 % — ABNORMAL HIGH (ref 11.5–15.5)
WBC: 6.3 10*3/uL (ref 4.0–10.5)
nRBC: 0 % (ref 0.0–0.2)

## 2023-06-01 LAB — BASIC METABOLIC PANEL WITH GFR
Anion gap: 9 (ref 5–15)
BUN: 37 mg/dL — ABNORMAL HIGH (ref 8–23)
CO2: 22 mmol/L (ref 22–32)
Calcium: 9.1 mg/dL (ref 8.9–10.3)
Chloride: 112 mmol/L — ABNORMAL HIGH (ref 98–111)
Creatinine, Ser: 2.03 mg/dL — ABNORMAL HIGH (ref 0.61–1.24)
GFR, Estimated: 30 mL/min — ABNORMAL LOW (ref >60)
Glucose, Bld: 75 mg/dL (ref 70–99)
Potassium: 3.3 mmol/L — ABNORMAL LOW (ref 3.5–5.1)
Sodium: 143 mmol/L (ref 135–145)

## 2023-06-01 LAB — GLUCOSE, CAPILLARY: Glucose-Capillary: 73 mg/dL (ref 70–99)

## 2023-06-01 MED ORDER — AMOXICILLIN-POT CLAVULANATE 500-125 MG PO TABS: 1.0000 | ORAL_TABLET | Freq: Two times a day (BID) | ORAL | Status: DC

## 2023-06-01 NOTE — Plan of Care (Signed)
  Problem: Health Behavior/Discharge Planning: Goal: Ability to manage health-related needs will improve Outcome: Progressing   Problem: Clinical Measurements: Goal: Ability to maintain clinical measurements within normal limits will improve Outcome: Progressing Goal: Will remain free from infection Outcome: Progressing Goal: Diagnostic test results will improve Outcome: Progressing Goal: Respiratory complications will improve Outcome: Progressing Goal: Cardiovascular complication will be avoided Outcome: Progressing   Problem: Nutrition: Goal: Adequate nutrition will be maintained Outcome: Progressing   Problem: Coping: Goal: Level of anxiety will decrease Outcome: Progressing   Problem: Safety: Goal: Ability to remain free from injury will improve Outcome: Progressing   Problem: Skin Integrity: Goal: Risk for impaired skin integrity will decrease Outcome: Progressing

## 2023-06-01 NOTE — Progress Notes (Signed)
 Report called to [facility] LPN at [facility]. [phone] room A5 bed 2
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
## 2023-06-01 NOTE — TOC Transition Note (Signed)
 Transition of Care [facility]) - Discharge Note   Patient Details  Name: Terrance Miller MRN: [id number] Date of Birth: Feb 09, 1928  Transition of Care [facility]) CM/SW Contact:  Ander Katos, LCSW Phone Number: 06/01/2023, 9:24 AM   Clinical Narrative: Pt d/c today back to [facility]. Pt's daughter and facility aware and agreeable. [name] at [facility] aware Siegfried Dress is pending and agreeable to return. D/C summary sent to SNF. RN given number to call report. Will transport via [employer].     Final next level of care: Skilled Nursing Facility Barriers to Discharge: Barriers Resolved   Patient Goals and CMS Choice Patient states their goals for this hospitalization and ongoing recovery are:: return to SNF     [place] ownership interest in [facility].provided to::  (n/a)    Discharge Placement                Patient to be transferred to facility by: [facility] Name of family member notified: [name], daughter Patient and family notified of of transfer: 06/01/23  Discharge Plan and Services Additional resources added to the After Visit Summary for   In-house Referral: Clinical Social Work   Post Acute Care Choice: Skilled Nursing Facility                               Social Drivers of Health (SDOH) Interventions SDOH Screenings   Food Insecurity: No Food Insecurity (05/29/2023)  Housing: Low Risk  (05/29/2023)  Transportation Needs: No Transportation Needs (05/29/2023)  Utilities: Not At Risk (05/29/2023)  Alcohol Screen: Low Risk  (01/05/2023)  Depression (PHQ2-9): Medium Risk (03/17/2023)  Financial Resource Strain: Low Risk  (01/05/2023)  Physical Activity: Inactive (01/05/2023)  Social Connections: Unknown (05/29/2023)  Recent Concern: Social Connections - Socially Isolated (05/04/2023)  Stress: No Stress Concern Present (01/05/2023)  Tobacco Use: Medium Risk (05/29/2023)  Health Literacy: Adequate Health Literacy (01/05/2023)      Readmission Risk Interventions    04/24/2023    1:31 PM 04/23/2023   11:23 AM 04/22/2023   10:47 AM  Readmission Risk Prevention Plan  Transportation Screening Complete Complete Complete  HRI or Home Care Consult Complete Complete Complete  Social Work Consult for Recovery Care Planning/Counseling Complete Complete Complete  Palliative Care Screening Complete Complete Complete  Medication Review [employer]) Complete Complete Complete

## 2023-06-01 NOTE — Care Management Important Message (Signed)
 Important Message  Patient Details  Name: Terrance Miller MRN: [id number] Date of Birth: December 30, 1928   Important Message Given:  N/A - LOS <3 / Initial given by admissions     [name] 06/01/2023, 10:35 AM

## 2023-06-02 DIAGNOSIS — M6282 Rhabdomyolysis: Secondary | ICD-10-CM | POA: Diagnosis not present

## 2023-06-02 DIAGNOSIS — I2510 Atherosclerotic heart disease of native coronary artery without angina pectoris: Secondary | ICD-10-CM | POA: Diagnosis not present

## 2023-06-02 DIAGNOSIS — M6281 Muscle weakness (generalized): Secondary | ICD-10-CM | POA: Diagnosis not present

## 2023-06-02 DIAGNOSIS — R001 Bradycardia, unspecified: Secondary | ICD-10-CM | POA: Diagnosis not present

## 2023-06-02 DIAGNOSIS — K567 Ileus, unspecified: Secondary | ICD-10-CM | POA: Diagnosis not present

## 2023-06-02 DIAGNOSIS — R278 Other lack of coordination: Secondary | ICD-10-CM | POA: Diagnosis not present

## 2023-06-02 DIAGNOSIS — I5033 Acute on chronic diastolic (congestive) heart failure: Secondary | ICD-10-CM | POA: Diagnosis not present

## 2023-06-03 ENCOUNTER — Ambulatory Visit: Admitting: Urology

## 2023-06-03 ENCOUNTER — Ambulatory Visit (INDEPENDENT_AMBULATORY_CARE_PROVIDER_SITE_OTHER): Admitting: Urology

## 2023-06-03 ENCOUNTER — Emergency Department (HOSPITAL_COMMUNITY)

## 2023-06-03 ENCOUNTER — Other Ambulatory Visit: Payer: Self-pay

## 2023-06-03 ENCOUNTER — Encounter: Payer: Self-pay | Admitting: Urology

## 2023-06-03 ENCOUNTER — Inpatient Hospital Stay (HOSPITAL_COMMUNITY)
Admission: EM | Admit: 2023-06-03 | Discharge: 2023-06-08 | DRG: 371 | Disposition: A | Discharge status: Skilled Nursing Facility | Attending: Internal Medicine | Admitting: Internal Medicine

## 2023-06-03 ENCOUNTER — Encounter (HOSPITAL_COMMUNITY): Payer: Self-pay | Admitting: Emergency Medicine

## 2023-06-03 VITALS — BP 88/52 | HR 48

## 2023-06-03 DIAGNOSIS — N1832 Chronic kidney disease, stage 3b: Secondary | ICD-10-CM | POA: Diagnosis present

## 2023-06-03 DIAGNOSIS — R680 Hypothermia, not associated with low environmental temperature: Secondary | ICD-10-CM | POA: Diagnosis present

## 2023-06-03 DIAGNOSIS — D539 Nutritional anemia, unspecified: Secondary | ICD-10-CM | POA: Diagnosis not present

## 2023-06-03 DIAGNOSIS — T68XXXA Hypothermia, initial encounter: Secondary | ICD-10-CM

## 2023-06-03 DIAGNOSIS — Z09 Encounter for follow-up examination after completed treatment for conditions other than malignant neoplasm: Secondary | ICD-10-CM

## 2023-06-03 DIAGNOSIS — E782 Mixed hyperlipidemia: Secondary | ICD-10-CM | POA: Diagnosis not present

## 2023-06-03 DIAGNOSIS — R001 Bradycardia, unspecified: Secondary | ICD-10-CM | POA: Insufficient documentation

## 2023-06-03 DIAGNOSIS — F0150 Vascular dementia without behavioral disturbance: Secondary | ICD-10-CM

## 2023-06-03 DIAGNOSIS — E1122 Type 2 diabetes mellitus with diabetic chronic kidney disease: Secondary | ICD-10-CM | POA: Diagnosis not present

## 2023-06-03 DIAGNOSIS — I5033 Acute on chronic diastolic (congestive) heart failure: Secondary | ICD-10-CM | POA: Diagnosis not present

## 2023-06-03 DIAGNOSIS — Z66 Do not resuscitate: Secondary | ICD-10-CM | POA: Diagnosis not present

## 2023-06-03 DIAGNOSIS — Z87891 Personal history of nicotine dependence: Secondary | ICD-10-CM

## 2023-06-03 DIAGNOSIS — I480 Paroxysmal atrial fibrillation: Secondary | ICD-10-CM | POA: Diagnosis not present

## 2023-06-03 DIAGNOSIS — M6282 Rhabdomyolysis: Secondary | ICD-10-CM | POA: Diagnosis not present

## 2023-06-03 DIAGNOSIS — I2510 Atherosclerotic heart disease of native coronary artery without angina pectoris: Secondary | ICD-10-CM | POA: Diagnosis present

## 2023-06-03 DIAGNOSIS — I130 Hypertensive heart and chronic kidney disease with heart failure and stage 1 through stage 4 chronic kidney disease, or unspecified chronic kidney disease: Secondary | ICD-10-CM | POA: Diagnosis not present

## 2023-06-03 DIAGNOSIS — I272 Pulmonary hypertension, unspecified: Secondary | ICD-10-CM | POA: Diagnosis not present

## 2023-06-03 DIAGNOSIS — E872 Acidosis, unspecified: Secondary | ICD-10-CM | POA: Diagnosis not present

## 2023-06-03 DIAGNOSIS — N401 Enlarged prostate with lower urinary tract symptoms: Secondary | ICD-10-CM

## 2023-06-03 DIAGNOSIS — E876 Hypokalemia: Secondary | ICD-10-CM | POA: Diagnosis not present

## 2023-06-03 DIAGNOSIS — G9341 Metabolic encephalopathy: Secondary | ICD-10-CM | POA: Diagnosis present

## 2023-06-03 DIAGNOSIS — N138 Other obstructive and reflux uropathy: Secondary | ICD-10-CM | POA: Diagnosis not present

## 2023-06-03 DIAGNOSIS — M6281 Muscle weakness (generalized): Secondary | ICD-10-CM | POA: Diagnosis not present

## 2023-06-03 DIAGNOSIS — K567 Ileus, unspecified: Secondary | ICD-10-CM | POA: Diagnosis not present

## 2023-06-03 DIAGNOSIS — D696 Thrombocytopenia, unspecified: Secondary | ICD-10-CM | POA: Diagnosis present

## 2023-06-03 DIAGNOSIS — I5032 Chronic diastolic (congestive) heart failure: Secondary | ICD-10-CM | POA: Diagnosis present

## 2023-06-03 DIAGNOSIS — R6889 Other general symptoms and signs: Secondary | ICD-10-CM | POA: Diagnosis not present

## 2023-06-03 DIAGNOSIS — Z955 Presence of coronary angioplasty implant and graft: Secondary | ICD-10-CM

## 2023-06-03 DIAGNOSIS — I700 Atherosclerosis of aorta: Secondary | ICD-10-CM | POA: Diagnosis not present

## 2023-06-03 DIAGNOSIS — D472 Monoclonal gammopathy: Secondary | ICD-10-CM | POA: Diagnosis present

## 2023-06-03 DIAGNOSIS — A0472 Enterocolitis due to Clostridium difficile, not specified as recurrent: Secondary | ICD-10-CM | POA: Diagnosis not present

## 2023-06-03 DIAGNOSIS — F0390 Unspecified dementia without behavioral disturbance: Secondary | ICD-10-CM | POA: Diagnosis not present

## 2023-06-03 DIAGNOSIS — I517 Cardiomegaly: Secondary | ICD-10-CM | POA: Diagnosis not present

## 2023-06-03 DIAGNOSIS — Z993 Dependence on wheelchair: Secondary | ICD-10-CM

## 2023-06-03 DIAGNOSIS — I509 Heart failure, unspecified: Secondary | ICD-10-CM | POA: Diagnosis not present

## 2023-06-03 DIAGNOSIS — Z7982 Long term (current) use of aspirin: Secondary | ICD-10-CM | POA: Diagnosis not present

## 2023-06-03 DIAGNOSIS — I4891 Unspecified atrial fibrillation: Secondary | ICD-10-CM | POA: Diagnosis not present

## 2023-06-03 DIAGNOSIS — I252 Old myocardial infarction: Secondary | ICD-10-CM | POA: Diagnosis not present

## 2023-06-03 DIAGNOSIS — R531 Weakness: Secondary | ICD-10-CM | POA: Diagnosis not present

## 2023-06-03 DIAGNOSIS — E1165 Type 2 diabetes mellitus with hyperglycemia: Secondary | ICD-10-CM | POA: Diagnosis not present

## 2023-06-03 DIAGNOSIS — T68XXXD Hypothermia, subsequent encounter: Secondary | ICD-10-CM

## 2023-06-03 DIAGNOSIS — I499 Cardiac arrhythmia, unspecified: Secondary | ICD-10-CM | POA: Diagnosis not present

## 2023-06-03 DIAGNOSIS — I10 Essential (primary) hypertension: Secondary | ICD-10-CM | POA: Diagnosis present

## 2023-06-03 DIAGNOSIS — R278 Other lack of coordination: Secondary | ICD-10-CM | POA: Diagnosis not present

## 2023-06-03 DIAGNOSIS — I959 Hypotension, unspecified: Secondary | ICD-10-CM

## 2023-06-03 DIAGNOSIS — R401 Stupor: Secondary | ICD-10-CM

## 2023-06-03 DIAGNOSIS — Z9861 Coronary angioplasty status: Secondary | ICD-10-CM

## 2023-06-03 DIAGNOSIS — Z743 Need for continuous supervision: Secondary | ICD-10-CM | POA: Diagnosis not present

## 2023-06-03 DIAGNOSIS — Z79899 Other long term (current) drug therapy: Secondary | ICD-10-CM

## 2023-06-03 DIAGNOSIS — N400 Enlarged prostate without lower urinary tract symptoms: Secondary | ICD-10-CM | POA: Diagnosis present

## 2023-06-03 DIAGNOSIS — A419 Sepsis, unspecified organism: Secondary | ICD-10-CM

## 2023-06-03 LAB — CULTURE, BLOOD (ROUTINE X 2)
Culture: NO GROWTH
Culture: NO GROWTH
Special Requests: ADEQUATE
Special Requests: ADEQUATE

## 2023-06-03 LAB — URINALYSIS, W/ REFLEX TO CULTURE (INFECTION SUSPECTED)
Bacteria, UA: NONE SEEN
Bilirubin Urine: NEGATIVE
Glucose, UA: NEGATIVE mg/dL
Hgb urine dipstick: NEGATIVE
Ketones, ur: NEGATIVE mg/dL
Leukocytes,Ua: NEGATIVE
Nitrite: NEGATIVE
Protein, ur: NEGATIVE mg/dL
Specific Gravity, Urine: 1.018 (ref 1.005–1.030)
pH: 5 (ref 5.0–8.0)

## 2023-06-03 LAB — CBC WITH DIFFERENTIAL/PLATELET
Abs Immature Granulocytes: 0.01 10*3/uL (ref 0.00–0.07)
Basophils Absolute: 0 10*3/uL (ref 0.0–0.1)
Basophils Relative: 0 %
Eosinophils Absolute: 0.3 10*3/uL (ref 0.0–0.5)
Eosinophils Relative: 6 %
HCT: 28.1 % — ABNORMAL LOW (ref 39.0–52.0)
Hemoglobin: 9.4 g/dL — ABNORMAL LOW (ref 13.0–17.0)
Immature Granulocytes: 0 %
Lymphocytes Relative: 24 %
Lymphs Abs: 1.2 10*3/uL (ref 0.7–4.0)
MCH: 34.7 pg — ABNORMAL HIGH (ref 26.0–34.0)
MCHC: 33.5 g/dL (ref 30.0–36.0)
MCV: 103.7 fL — ABNORMAL HIGH (ref 80.0–100.0)
Monocytes Absolute: 0.4 10*3/uL (ref 0.1–1.0)
Monocytes Relative: 7 %
Neutro Abs: 3.1 10*3/uL (ref 1.7–7.7)
Neutrophils Relative %: 63 %
Platelets: 55 10*3/uL — ABNORMAL LOW (ref 150–400)
RBC: 2.71 MIL/uL — ABNORMAL LOW (ref 4.22–5.81)
RDW: 21.8 % — ABNORMAL HIGH (ref 11.5–15.5)
WBC: 5 10*3/uL (ref 4.0–10.5)
nRBC: 0 % (ref 0.0–0.2)

## 2023-06-03 LAB — TSH: TSH: 4.137 u[IU]/mL (ref 0.350–4.500)

## 2023-06-03 LAB — COMPREHENSIVE METABOLIC PANEL WITH GFR
ALT: 30 U/L (ref 0–44)
AST: 36 U/L (ref 15–41)
Albumin: 3 g/dL — ABNORMAL LOW (ref 3.5–5.0)
Alkaline Phosphatase: 69 U/L (ref 38–126)
Anion gap: 9 (ref 5–15)
BUN: 32 mg/dL — ABNORMAL HIGH (ref 8–23)
CO2: 23 mmol/L (ref 22–32)
Calcium: 9.5 mg/dL (ref 8.9–10.3)
Chloride: 110 mmol/L (ref 98–111)
Creatinine, Ser: 1.54 mg/dL — ABNORMAL HIGH (ref 0.61–1.24)
GFR, Estimated: 42 mL/min — ABNORMAL LOW (ref >60)
Glucose, Bld: 153 mg/dL — ABNORMAL HIGH (ref 70–99)
Potassium: 3.8 mmol/L (ref 3.5–5.1)
Sodium: 142 mmol/L (ref 135–145)
Total Bilirubin: 0.8 mg/dL (ref 0.0–1.2)
Total Protein: 7.1 g/dL (ref 6.5–8.1)

## 2023-06-03 LAB — LACTIC ACID, PLASMA
Lactic Acid, Venous: 2.2 mmol/L (ref 0.5–1.9)
Lactic Acid, Venous: 2.2 mmol/L (ref 0.5–1.9)

## 2023-06-03 MED ORDER — VANCOMYCIN HCL 1750 MG/350ML IV SOLN
1750.0000 mg | Freq: Once | INTRAVENOUS | Status: AC
  Administered 2023-06-04: 1750 mg via INTRAVENOUS
  Filled 2023-06-03: qty 350

## 2023-06-03 MED ORDER — SODIUM CHLORIDE 0.9 % IV SOLN
2.0000 g | Freq: Once | INTRAVENOUS | Status: AC
  Administered 2023-06-03: 2 g via INTRAVENOUS
  Filled 2023-06-03: qty 12.5

## 2023-06-03 MED ORDER — METRONIDAZOLE 500 MG/100ML IV SOLN
500.0000 mg | Freq: Once | INTRAVENOUS | Status: AC
  Administered 2023-06-03: 500 mg via INTRAVENOUS
  Filled 2023-06-03: qty 100

## 2023-06-03 MED ORDER — ATROPINE SULFATE 1 MG/10ML IJ SOSY
0.5000 mg | PREFILLED_SYRINGE | Freq: Once | INTRAMUSCULAR | Status: AC
  Administered 2023-06-03: 0.5 mg via INTRAVENOUS
  Filled 2023-06-03: qty 10

## 2023-06-03 MED ORDER — CHLORHEXIDINE GLUCONATE CLOTH 2 % EX PADS
6.0000 | MEDICATED_PAD | Freq: Every day | CUTANEOUS | Status: DC
  Administered 2023-06-03 – 2023-06-08 (×5): 6 via TOPICAL

## 2023-06-03 MED ORDER — LACTATED RINGERS IV BOLUS: 1000.0000 mL | Freq: Once | INTRAVENOUS | Status: DC

## 2023-06-03 MED ORDER — VANCOMYCIN HCL IN DEXTROSE 1-5 GM/200ML-% IV SOLN: 1000.0000 mg | Freq: Once | INTRAVENOUS | Status: DC

## 2023-06-03 MED ORDER — LACTATED RINGERS IV BOLUS
2000.0000 mL | Freq: Once | INTRAVENOUS | Status: AC
  Administered 2023-06-03: 2000 mL via INTRAVENOUS

## 2023-06-03 NOTE — ED Triage Notes (Signed)
 Pt was at [facility] for a follow up. Pt states he was weak, low heart rate, hypotension.

## 2023-06-03 NOTE — ED Notes (Signed)
 Pt is a&o, he is just hard to understand at first.

## 2023-06-03 NOTE — H&P (Incomplete)
 History and Physical    Patient: Terrance Miller ZOX:[id number] DOB: Oct 06, 1928 DOA: 06/03/2023 DOS: the patient was seen and examined on 06/04/2023 PCP: System, Provider Not In  Patient coming from: SNF  Chief Complaint:  Chief Complaint  Patient presents with   Weakness   HPI: Terrance Miller is a 89 y.o. male with medical history significant of hypertension, hyperlipidemia, CAD s/p PCI, dementia who presents to the emergency department due to concern for sepsis.  Patient was admitted in 05/22 to 5/26 due to proctitis and hypothermia.  He was treated with IV Zosyn  and then transition to Augmentin  on discharge.  Apparently, patient has been doing well since discharge to SNF per ED Medical record.  He had a follow-up appointment with urology today and was noted to have a BP of 88/52 with HR of 48, so he was sent to the emergency department due to concern for sepsis.  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  Unfortunately, his overall health status was slowly declining at that time and started to show to signs of cognitive impairment and was having ambulatory dysfunction.  Patient ambulates with a walker at baseline.  ED Course:  In the emergency department, temperature was 98.24F on arrival to the ED, this has since decreased to 89.5F, she was bradycardic with a pulse of 48 bpm, BP was in hypotensive range at 88/52, respiratory rate was 12/min and O2 sat was 97% on room air.  Workup in the ED showed macrocytic anemia.  BMP was normal except for blood glucose of 153, BUN/creatinine 32/1.54 (creatinine is within baseline range).  Lactic acid 2.2 > 2.2, urinalysis was normal, TSH was normal.  Blood culture pending Chest x-ray showed no active disease.  Mild cardiomegaly. IV atropine 0.5 mg was given due to bradycardia with HR in low 40s. Cardiologist (Dr. Londa Rival) was consulted  regarding patient's bradycardia and recommended holding patient's Coreg  and to monitor patient's overnight with plan to consult on patient in the morning per EDP.  [facility] was asked to admit patient for further evaluation and management.  Review of Systems: Review of systems as noted in the HPI. All other systems reviewed and are negative.   Past Medical History:  Diagnosis Date   Acute ST elevation myocardial infarction (STEMI) of inferior wall (HCC) 2004   Coronary artery disease    DES x2 to the RCA 2004, residual disease managed medically   Hyperlipidemia    Hypertension    Type 2 diabetes mellitus (HCC)    Past Surgical History:  Procedure Laterality Date   BIOPSY  01/28/2022   Procedure: BIOPSY;  Surgeon: Umberto Ganong, Bearl Limes, MD;  Location: [facility];  Service: Gastroenterology;;   CATARACT EXTRACTION Bilateral    CORONARY ANGIOPLASTY WITH STENT PLACEMENT  09/05/2002   stent RCA, 80% first diagonal, 70-80% mid-diagonal, 80% mid LAD stenosis   FLEXIBLE SIGMOIDOSCOPY N/A 01/28/2022   Procedure: FLEXIBLE SIGMOIDOSCOPY;  Surgeon: Urban Garden, MD;  Location: [facility];  Service: Gastroenterology;  Laterality: N/A;   HYDROCELE EXCISION Bilateral 11/20/2021   Procedure: HYDROCELECTOMY ADULT;  Surgeon: Mellie Sprinkle., MD;  Location: [facility];  Service: Urology;  Laterality: Bilateral;   IMPACTION REMOVAL  01/28/2022   Procedure: IMPACTION REMOVAL;  Surgeon: Urban Garden, MD;  Location: [facility];  Service: Gastroenterology;;   NM MYOCAR PERF WALL MOTION  11/01/2008   Normal  Social History:  reports that he quit smoking about 17 years ago. His smoking use included cigarettes. He has been exposed to tobacco smoke. He has never used smokeless tobacco. He reports that he does not currently use alcohol. He reports that he does not use drugs.   Allergies  Allergen Reactions   Clobetasol  Other (See Comments)    Unknown  No reaction listed on  MAR from facility    Family History  Problem Relation Age of Onset   Hypertension Father    Diabetes Sister      Prior to Admission medications   Medication Sig Start Date End Date Taking? Authorizing Provider  acetaminophen  (TYLENOL ) 325 MG tablet Take 2 tablets (650 mg total) by mouth every 6 (six) hours as needed for mild pain (pain score 1-3) (or Fever >/= 101). Patient taking differently: Take 650 mg by mouth every 6 (six) hours as needed for mild pain (pain score 1-3) (or Fever >/= 101). Do not exceed 3 g in 24 hours 04/27/23  Yes Emokpae, Courage, MD  albuterol  (PROVENTIL ) (2.5 MG/3ML) 0.083% nebulizer solution Take 2.5 mg by nebulization every 6 (six) hours as needed for wheezing or shortness of breath.   Yes [provider]  amLODipine  (NORVASC ) 10 MG tablet Take 1 tablet (10 mg total) by mouth daily. 04/27/23 04/26/24 Yes Colin Dawley, MD  amoxicillin -clavulanate (AUGMENTIN ) 500-125 MG tablet Take 1 tablet by mouth in the morning and at bedtime for 4 days. 06/01/23 06/05/23 Yes Wynetta Heckle, MD  aspirin  EC 81 MG tablet Take 1 tablet (81 mg total) by mouth daily with breakfast. Swallow whole. 04/27/23  Yes Colin Dawley, MD  bisacodyl  (DULCOLAX) 10 MG suppository Place 1 suppository (10 mg total) rectally every Monday, Wednesday, and Friday. Patient taking differently: Place 10 mg rectally 3 (three) times a week. Monday, Wednesday, and Friday at bedtime 01/31/22  Yes Emokpae, Courage, MD  carvedilol  (COREG ) 3.125 MG tablet Take 1 tablet (3.125 mg total) by mouth 2 (two) times daily. 04/27/23  Yes Emokpae, Courage, MD  cholecalciferol  (VITAMIN D ) 1000 units tablet Take 1,000 Units by mouth daily.   Yes [provider]  cyanocobalamin  (VITAMIN B12) 1000 MCG tablet Take 1,000 mcg by mouth daily.   Yes [provider]  docusate sodium  (COLACE) 100 MG capsule Take 100 mg by mouth 2 (two) times daily.   Yes [provider]  folic acid  (FOLVITE ) 1 MG  tablet Take 1 tablet (1 mg total) by mouth daily. 03/17/23  Yes Cook, [name], DO  furosemide  (LASIX ) 40 MG tablet Take 1 tablet (40 mg total) by mouth daily. 05/13/23  Yes Tat, Myrtie Atkinson, MD  latanoprost  (XALATAN ) 0.005 % ophthalmic solution Place 1 drop into both eyes 2 (two) times daily. 08/20/22  Yes Marilyne Shu, NP  melatonin 3 MG TABS tablet Take 3 mg by mouth at bedtime.   Yes [provider]  memantine  (NAMENDA ) 5 MG tablet Take 5 mg by mouth daily.   Yes [provider]  polyethylene glycol (MIRALAX  / GLYCOLAX ) 17 g packet Take 17 g by mouth 2 (two) times daily. 01/31/22  Yes Emokpae, Courage, MD  rosuvastatin  (CRESTOR ) 5 MG tablet Take 5 mg by mouth daily.   Yes [provider]  senna (SENOKOT) 8.6 MG TABS tablet Take 2 tablets (17.2 mg total) by mouth at bedtime. 04/27/23  Yes Emokpae, Courage, MD  sertraline (ZOLOFT) 25 MG tablet Take 25 mg by mouth daily.   Yes [provider]  tamsulosin  (FLOMAX ) 0.4 MG CAPS capsule Take 1 capsule (0.4 mg total) by mouth daily. 08/20/22  Yes Marilyne Shu, NP    Physical Exam: BP (!) 120/99   Pulse (!) 47   Temp (!) 93.4 F (34.1 C)   Resp 16   Ht 6' (1.829 m)   Wt 79.7 kg   SpO2 99%   BMI 23.83 kg/m   General: 89 y.o. year-old male well developed well nourished in no acute distress.  Alert and oriented x3. HEENT: NCAT, EOMI Neck: Supple, trachea medial Cardiovascular: Bradycardia.  Irregular rate and rhythm with no rubs or gallops.  No thyromegaly or JVD noted.  No lower extremity edema. 2/4 pulses in all 4 extremities. Respiratory: Clear to auscultation with no wheezes or rales. Good inspiratory effort. Abdomen: Soft, nontender nondistended with normal bowel sounds x4 quadrants. Muskuloskeletal: No cyanosis, clubbing or edema noted bilaterally Neuro: CN II-XII intact, strength 5/5 x 4, sensation, reflexes intact Skin: No ulcerative lesions noted or rashes Psychiatry: Judgement and insight appear  normal. Mood is appropriate for condition and setting          Labs on Admission:  Basic Metabolic Panel: Recent Labs  Lab 05/28/23 2204 05/29/23 0439 05/30/23 0338 05/31/23 0523 06/01/23 0703 06/03/23 1510  NA 141 143 143 149* 143 142  K 3.9 3.5 3.7 3.3* 3.3* 3.8  CL 114* 117* 118* 119* 112* 110  CO2 21* 21* 21* 22 22 23   GLUCOSE 121* 70 71 74 75 153*  BUN 37* 37* 38* 35* 37* 32*  CREATININE 1.59* 1.64* 1.83* 1.92* 2.03* 1.54*  CALCIUM  9.2 8.6* 9.0 9.3 9.1 9.5  MG 2.1  --   --   --   --   --    Liver Function Tests: Recent Labs  Lab 05/28/23 2204 06/03/23 1510  AST 79* 36  ALT 64* 30  ALKPHOS 93 69  BILITOT 0.9 0.8  PROT 7.6 7.1  ALBUMIN 3.3* 3.0*   No results for input(s): "LIPASE", "AMYLASE" in the last 168 hours. No results for input(s): "AMMONIA" in the last 168 hours. CBC: Recent Labs  Lab 05/28/23 2204 05/29/23 0439 05/30/23 0338 05/31/23 0523 06/01/23 0703 06/03/23 1510  WBC 6.7 4.4 7.6 4.9 6.3 5.0  NEUTROABS 4.6  --   --  3.0  --  3.1  HGB 9.9* 8.6* 9.7* 8.3* 8.1* 9.4*  HCT 29.1* 25.5* 28.4* 24.3* 24.7* 28.1*  MCV 103.9* 102.0* 101.8* 102.1* 101.6* 103.7*  PLT 78* 67* 61* 49* 56* 55*   Cardiac Enzymes: No results for input(s): "CKTOTAL", "CKMB", "CKMBINDEX", "TROPONINI" in the last 168 hours.  BNP (last 3 results) Recent Labs    12/31/22 2102 04/21/23 1431  BNP 1,097.0* 1,362.0*    ProBNP (last 3 results) No results for input(s): "PROBNP" in the last 8760 hours.  CBG: Recent Labs  Lab 05/31/23 0740 05/31/23 1138 05/31/23 1614 05/31/23 2130 06/01/23 0723  GLUCAP 73 109* 93 87 73    Radiological Exams on Admission: DG Chest Port 1 View Result Date: 06/03/2023 CLINICAL DATA:  Possible sepsis EXAM: PORTABLE CHEST 1 VIEW COMPARISON:  CT 05/29/2023, chest x-ray 05/28/2023, 01/04/2023, 07/29/2022 FINDINGS: Mild cardiomegaly with aortic atherosclerosis. Chronic appearing interstitial opacities at left base, likely corresponds to  scarring on CT. No definite acute airspace disease, pleural effusion or pneumothorax. IMPRESSION: No active disease. Mild cardiomegaly. Electronically Signed   By: Esmeralda Hedge M.D.   On: 06/03/2023 16:32    EKG: I independently viewed the EKG done and my  findings are as followed: Atrial fibrillation with slow ventricular response  Assessment/Plan Present on Admission:  New onset atrial fibrillation (HCC)  Hypothermia  Macrocytic anemia  Thrombocytopenia (HCC)  Acute metabolic encephalopathy  CAD (coronary artery disease)  Essential hypertension  Stage 3b chronic kidney disease (HCC)  Principal Problem:   New onset atrial fibrillation (HCC) Active Problems:   Hypothermia   Essential hypertension   CAD (coronary artery disease)   Stage 3b chronic kidney disease (HCC)   Macrocytic anemia   Acute metabolic encephalopathy   Thrombocytopenia (HCC)   Bradycardia   Hypotension   Lactic acidosis   Type 2 diabetes mellitus with hyperglycemia (HCC)  New onset atrial fibrillation with SVR EKG personally reviewed showed atrial fibrillation with slow ventricular response CHADs-Vasc score = 6 Continue telemetry Home Coreg  will be held at this time Cardiology will be consulted officially with further recommendations  Hypothermia (recurrent) It was noted that patient presented with similar presentation in prior admissions.  Patient did not meet sepsis criteria (similar to last admission) Bair Hugger was provided TSH was normal; AM cortisol will be checked  Bradycardia HR decreased to 40; IV atropine 0.5 mg x 1 was given. Continue telemetry Home Coreg  will be held at this time Dr. Londa Rival (cardiologist) was consulted and recommended observing patient overnight with plan for cardiology to consult on patient in the morning per EDP.  Hypotension-resolved  Lactic acidosis Lactic acidosis x 2 was flat at 2.2 Continue to trend lactic acid  Macrocytic anemia MCV 103.7, hemoglobin  9.4 Vitamin B12 and folate levels will be checked Continue vitamin B12 and folic acid  Iron studies will also be done due to suspicion for iron deficiency anemia considering the elevated RDW  Thrombocytopenia (chronic) Platelets 55; continue to monitor with morning labs  Type 2 diabetes mellitus with hyperglycemia Hemoglobin A1c on 05/04/2023 was 4.4 which indicates excellent blood glucose control Continue diet and lifestyle modification  Acute metabolic encephalopathy on chronic dementia:  Continue namenda  Continue fall precaution, delirium precautions.    CAD s/p DES x2 to RCA 2004 Chronic HFpEF Continue aspirin , Crestor   Coreg  will be held at this time due to bradycardia and hypotension  Essential hypertension BP meds temporarily held due to soft BP  Stage IIIb CKD  BUN/creatinine 32/1.54 (creatinine is within baseline range). Renally adjust medications, avoid nephrotoxic agents/dehydration/hypotension   DVT prophylaxis: SCDs  Code Status: DNR  Family Communication: Daughter at bedside (all questions answered to satisfaction)  Consults: Cardiology  Severity of Illness: The appropriate patient status for this patient is OBSERVATION. Observation status is judged to be reasonable and necessary in order to provide the required intensity of service to ensure the patient's safety. The patient's presenting symptoms, physical exam findings, and initial radiographic and laboratory data in the context of their medical condition is felt to place them at decreased risk for further clinical deterioration. Furthermore, it is anticipated that the patient will be medically stable for discharge from the hospital within 2 midnights of admission.   Author: Mackey Varricchio, DO 06/04/2023 12:50 AM  For on call review [email or web address].

## 2023-06-03 NOTE — Consult Note (Signed)
 CODE SEPSIS - PHARMACY COMMUNICATION  **Broad-spectrum antimicrobials should be administered within one hour of sepsis diagnosis**  Time Code Sepsis call or page was received: 2010  Antibiotics ordered: Cefepime , Vancomycin , Metronidazole   Time of first antibiotic administration: 2039  Additional action taken by pharmacy: N/A  If necessary, name of provider/nurse contacted: N/A    Will M. Alva Jewels, PharmD Clinical Pharmacist 06/03/2023 8:20 PM

## 2023-06-03 NOTE — ED Notes (Signed)
 Pt rectal temp found to be 90.7, Dr. Efraim Grange made aware. Orders received.

## 2023-06-03 NOTE — ED Notes (Signed)
 Pt states he is continent. EDP gave permission to do in/out cath instead of foley in order to obtain urine.

## 2023-06-03 NOTE — H&P (Incomplete)
 History and Physical    Patient: Terrance Miller XBJ:[id number] DOB: 05-21-1928 DOA: 06/03/2023 DOS: the patient was seen and examined on 06/03/2023 PCP: System, Provider Not In  Patient coming from: SNF  Chief Complaint:  Chief Complaint  Patient presents with  . Weakness   HPI: Terrance Miller is a 89 y.o. male with medical history significant of hypertension, hyperlipidemia, CAD s/p PCI, dementia who presents to the emergency department due to concern for sepsis.  Patient was admitted in 05/22 to 5/26 due to proctitis and hypothermia.  He was treated with IV Zosyn  and then transition to Augmentin  on discharge.  Apparently, patient has been doing well since discharge to SNF per ED Medical record.  He had a follow-up appointment with urology today and was noted to have a BP of 88/52 with HR of 48, so he was sent to the emergency department due to concern for sepsis.  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  Unfortunately, his overall health status was slowly declining at that time and started to show to signs of cognitive impairment and was having ambulatory dysfunction.  Patient ambulates with a walker at baseline.  ED Course:  In the emergency department, temperature was 98.30F on arrival to the ED, this has since decreased to 89.48F, she was bradycardic with a pulse of 48 bpm, BP was in hypotensive range at 88/52, respiratory rate was 12/min and O2 sat was 97% on room air.  Workup in the ED showed macrocytic anemia.  BMP was normal except for blood glucose of 153, BUN/creatinine 32/1.54 (creatinine is within baseline range)  Review of Systems: Review of systems as noted in the HPI. All other systems reviewed and are negative.   Past Medical History:  Diagnosis Date  . Acute ST elevation myocardial infarction (STEMI) of inferior wall (HCC) 2004  . Coronary artery disease     DES x2 to the RCA 2004, residual disease managed medically  . Hyperlipidemia   . Hypertension   . Type 2 diabetes mellitus (HCC)    Past Surgical History:  Procedure Laterality Date  . BIOPSY  01/28/2022   Procedure: BIOPSY;  Surgeon: Umberto Ganong, Bearl Limes, MD;  Location: [facility];  Service: Gastroenterology;;  . CATARACT EXTRACTION Bilateral   . CORONARY ANGIOPLASTY WITH STENT PLACEMENT  09/05/2002   stent RCA, 80% first diagonal, 70-80% mid-diagonal, 80% mid LAD stenosis  . FLEXIBLE SIGMOIDOSCOPY N/A 01/28/2022   Procedure: FLEXIBLE SIGMOIDOSCOPY;  Surgeon: Urban Garden, MD;  Location: [facility];  Service: Gastroenterology;  Laterality: N/A;  . HYDROCELE EXCISION Bilateral 11/20/2021   Procedure: HYDROCELECTOMY ADULT;  Surgeon: Mellie Sprinkle., MD;  Location: [facility];  Service: Urology;  Laterality: Bilateral;  . IMPACTION REMOVAL  01/28/2022   Procedure: IMPACTION REMOVAL;  Surgeon: Urban Garden, MD;  Location: [facility];  Service: Gastroenterology;;  . NM MYOCAR PERF WALL MOTION  11/01/2008   Normal    Social History:  reports that he quit smoking about 17 years ago. His smoking use included cigarettes. He has been exposed to tobacco smoke. He has never used smokeless tobacco. He reports that he does not currently use alcohol. He reports that he does not use drugs.   Allergies  Allergen Reactions  . Clobetasol  Other (See Comments)    Unknown  No reaction listed on MAR from facility    Family History  Problem Relation Age of Onset  . Hypertension Father   . Diabetes Sister     ***  Prior to Admission medications   Medication Sig Start Date End Date Taking? Authorizing Provider  acetaminophen  (TYLENOL ) 325 MG tablet Take 2 tablets (650 mg total) by mouth every 6 (six) hours as needed for mild pain (pain score 1-3) (or Fever >/= 101). Patient taking differently: Take 650 mg by mouth every 6 (six) hours as needed for mild pain  (pain score 1-3) (or Fever >/= 101). Do not exceed 3 g in 24 hours 04/27/23  Yes Emokpae, Courage, MD  albuterol  (PROVENTIL ) (2.5 MG/3ML) 0.083% nebulizer solution Take 2.5 mg by nebulization every 6 (six) hours as needed for wheezing or shortness of breath.   Yes [provider]  amLODipine  (NORVASC ) 10 MG tablet Take 1 tablet (10 mg total) by mouth daily. 04/27/23 04/26/24 Yes Colin Dawley, MD  amoxicillin -clavulanate (AUGMENTIN ) 500-125 MG tablet Take 1 tablet by mouth in the morning and at bedtime for 4 days. 06/01/23 06/05/23 Yes Wynetta Heckle, MD  aspirin  EC 81 MG tablet Take 1 tablet (81 mg total) by mouth daily with breakfast. Swallow whole. 04/27/23  Yes Colin Dawley, MD  bisacodyl  (DULCOLAX) 10 MG suppository Place 1 suppository (10 mg total) rectally every Monday, Wednesday, and Friday. Patient taking differently: Place 10 mg rectally 3 (three) times a week. Monday, Wednesday, and Friday at bedtime 01/31/22  Yes Emokpae, Courage, MD  carvedilol  (COREG ) 3.125 MG tablet Take 1 tablet (3.125 mg total) by mouth 2 (two) times daily. 04/27/23  Yes Emokpae, Courage, MD  cholecalciferol  (VITAMIN D ) 1000 units tablet Take 1,000 Units by mouth daily.   Yes [provider]  cyanocobalamin  (VITAMIN B12) 1000 MCG tablet Take 1,000 mcg by mouth daily.   Yes [provider]  docusate sodium  (COLACE) 100 MG capsule Take 100 mg by mouth 2 (two) times daily.   Yes [provider]  folic acid  (FOLVITE ) 1 MG tablet Take 1 tablet (1 mg total) by mouth daily. 03/17/23  Yes Cook, [name], DO  furosemide  (LASIX ) 40 MG tablet Take 1 tablet (40 mg total) by mouth daily. 05/13/23  Yes Tat, Myrtie Atkinson, MD  latanoprost  (XALATAN ) 0.005 % ophthalmic solution Place 1 drop into both eyes 2 (two) times daily. 08/20/22  Yes Marilyne Shu, NP  melatonin 3 MG TABS tablet Take 3 mg by mouth at bedtime.   Yes [provider]  memantine  (NAMENDA ) 5 MG tablet Take 5 mg by mouth daily.   Yes  [provider]  polyethylene glycol (MIRALAX  / GLYCOLAX ) 17 g packet Take 17 g by mouth 2 (two) times daily. 01/31/22  Yes Emokpae, Courage, MD  rosuvastatin  (CRESTOR ) 5 MG tablet Take 5 mg by mouth daily.   Yes [provider]  senna (SENOKOT) 8.6 MG TABS tablet Take 2 tablets (17.2 mg total) by mouth at bedtime. 04/27/23  Yes Emokpae, Courage, MD  sertraline (ZOLOFT) 25 MG tablet Take 25 mg by mouth daily.   Yes [provider]  tamsulosin  (FLOMAX ) 0.4 MG CAPS capsule Take 1 capsule (0.4 mg total) by mouth daily. 08/20/22  Yes Marilyne Shu, NP    Physical Exam: BP 136/74   Pulse (!) 49   Temp (!) 91.8 F (33.2 C)   Resp 11   Ht 6' (1.829 m)   Wt 79.7 kg   SpO2 100%   BMI 23.83 kg/m   General: 89 y.o. year-old male well developed well  nourished in no acute distress.  Alert and oriented x3. HEENT: NCAT, EOMI Neck: Supple, trachea medial Cardiovascular: Regular rate and rhythm with no rubs or gallops.  No thyromegaly or JVD noted.  No lower extremity edema. 2/4 pulses in all 4 extremities. Respiratory: Clear to auscultation with no wheezes or rales. Good inspiratory effort. Abdomen: Soft, nontender nondistended with normal bowel sounds x4 quadrants. Muskuloskeletal: No cyanosis, clubbing or edema noted bilaterally Neuro: CN II-XII intact, strength 5/5 x 4, sensation, reflexes intact Skin: No ulcerative lesions noted or rashes Psychiatry: Judgement and insight appear normal. Mood is appropriate for condition and setting          Labs on Admission:  Basic Metabolic Panel: Recent Labs  Lab 05/28/23 2204 05/29/23 0439 05/30/23 0338 05/31/23 0523 06/01/23 0703 06/03/23 1510  NA 141 143 143 149* 143 142  K 3.9 3.5 3.7 3.3* 3.3* 3.8  CL 114* 117* 118* 119* 112* 110  CO2 21* 21* 21* 22 22 23   GLUCOSE 121* 70 71 74 75 153*  BUN 37* 37* 38* 35* 37* 32*  CREATININE 1.59* 1.64* 1.83* 1.92* 2.03* 1.54*  CALCIUM  9.2 8.6* 9.0 9.3 9.1 9.5  MG 2.1  --    --   --   --   --    Liver Function Tests: Recent Labs  Lab 05/28/23 2204 06/03/23 1510  AST 79* 36  ALT 64* 30  ALKPHOS 93 69  BILITOT 0.9 0.8  PROT 7.6 7.1  ALBUMIN 3.3* 3.0*   No results for input(s): "LIPASE", "AMYLASE" in the last 168 hours. No results for input(s): "AMMONIA" in the last 168 hours. CBC: Recent Labs  Lab 05/28/23 2204 05/29/23 0439 05/30/23 0338 05/31/23 0523 06/01/23 0703 06/03/23 1510  WBC 6.7 4.4 7.6 4.9 6.3 5.0  NEUTROABS 4.6  --   --  3.0  --  3.1  HGB 9.9* 8.6* 9.7* 8.3* 8.1* 9.4*  HCT 29.1* 25.5* 28.4* 24.3* 24.7* 28.1*  MCV 103.9* 102.0* 101.8* 102.1* 101.6* 103.7*  PLT 78* 67* 61* 49* 56* 55*   Cardiac Enzymes: No results for input(s): "CKTOTAL", "CKMB", "CKMBINDEX", "TROPONINI" in the last 168 hours.  BNP (last 3 results) Recent Labs    12/31/22 2102 04/21/23 1431  BNP 1,097.0* 1,362.0*    ProBNP (last 3 results) No results for input(s): "PROBNP" in the last 8760 hours.  CBG: Recent Labs  Lab 05/31/23 0740 05/31/23 1138 05/31/23 1614 05/31/23 2130 06/01/23 0723  GLUCAP 73 109* 93 87 73    Radiological Exams on Admission: DG Chest Port 1 View Result Date: 06/03/2023 CLINICAL DATA:  Possible sepsis EXAM: PORTABLE CHEST 1 VIEW COMPARISON:  CT 05/29/2023, chest x-ray 05/28/2023, 01/04/2023, 07/29/2022 FINDINGS: Mild cardiomegaly with aortic atherosclerosis. Chronic appearing interstitial opacities at left base, likely corresponds to scarring on CT. No definite acute airspace disease, pleural effusion or pneumothorax. IMPRESSION: No active disease. Mild cardiomegaly. Electronically Signed   By: Esmeralda Hedge M.D.   On: 06/03/2023 16:32    EKG: I independently viewed the EKG done and my findings are as followed: ***   Assessment/Plan Present on Admission: . New onset atrial fibrillation (HCC)  Principal Problem:   New onset atrial fibrillation (HCC)   DVT prophylaxis: ***   Code Status: ***   Family Communication:  ***   Disposition Plan: ***   Consults called: ***   Admission status: ***     Twilla Galea MD [facility] Pager [phone]  If 7PM-7AM, please contact night-coverage [email or web address] Password [email or web address]  06/03/2023,  11:57 PM       Review of Systems: {ROS_Text:26778} Past Medical History:  Diagnosis Date  . Acute ST elevation myocardial infarction (STEMI) of inferior wall (HCC) 2004  . Coronary artery disease    DES x2 to the RCA 2004, residual disease managed medically  . Hyperlipidemia   . Hypertension   . Type 2 diabetes mellitus (HCC)    Past Surgical History:  Procedure Laterality Date  . BIOPSY  01/28/2022   Procedure: BIOPSY;  Surgeon: Umberto Ganong, Bearl Limes, MD;  Location: [facility];  Service: Gastroenterology;;  . CATARACT EXTRACTION Bilateral   . CORONARY ANGIOPLASTY WITH STENT PLACEMENT  09/05/2002   stent RCA, 80% first diagonal, 70-80% mid-diagonal, 80% mid LAD stenosis  . FLEXIBLE SIGMOIDOSCOPY N/A 01/28/2022   Procedure: FLEXIBLE SIGMOIDOSCOPY;  Surgeon: Urban Garden, MD;  Location: [facility];  Service: Gastroenterology;  Laterality: N/A;  . HYDROCELE EXCISION Bilateral 11/20/2021   Procedure: HYDROCELECTOMY ADULT;  Surgeon: Mellie Sprinkle., MD;  Location: [facility];  Service: Urology;  Laterality: Bilateral;  . IMPACTION REMOVAL  01/28/2022   Procedure: IMPACTION REMOVAL;  Surgeon: Urban Garden, MD;  Location: [facility];  Service: Gastroenterology;;  . NM MYOCAR PERF WALL MOTION  11/01/2008   Normal   Social History:  reports that he quit smoking about 17 years ago. His smoking use included cigarettes. He has been exposed to tobacco smoke. He has never used smokeless tobacco. He reports that he does not currently use alcohol. He reports that he does not use drugs.  Allergies  Allergen Reactions  . Clobetasol  Other (See Comments)    Unknown  No reaction listed on MAR    Family History  Problem  Relation Age of Onset  . Hypertension Father   . Diabetes Sister     Prior to Admission medications   Medication Sig Start Date End Date Taking? Authorizing Provider  acetaminophen  (TYLENOL ) 325 MG tablet Take 2 tablets (650 mg total) by mouth every 6 (six) hours as needed for mild pain (pain score 1-3) (or Fever >/= 101). Patient taking differently: Take 650 mg by mouth every 6 (six) hours as needed for mild pain (pain score 1-3) (or Fever >/= 101). Do not exceed 3 g in 24 hours 04/27/23   Colin Dawley, MD  albuterol  (PROVENTIL ) (2.5 MG/3ML) 0.083% nebulizer solution Take 2.5 mg by nebulization every 6 (six) hours as needed for wheezing or shortness of breath.    [provider]  amLODipine  (NORVASC ) 10 MG tablet Take 1 tablet (10 mg total) by mouth daily. 04/27/23 04/26/24  Colin Dawley, MD  amoxicillin -clavulanate (AUGMENTIN ) 500-125 MG tablet Take 1 tablet by mouth in the morning and at bedtime for 4 days. 06/01/23 06/05/23  Wynetta Heckle, MD  aspirin  EC 81 MG tablet Take 1 tablet (81 mg total) by mouth daily with breakfast. Swallow whole. 04/27/23   Colin Dawley, MD  bisacodyl  (DULCOLAX) 10 MG suppository Place 1 suppository (10 mg total) rectally every Monday, Wednesday, and Friday. Patient taking differently: Place 10 mg rectally 3 (three) times a week. Monday, Wednesday, and Friday 01/31/22   Colin Dawley, MD  carvedilol  (COREG ) 3.125 MG tablet Take 1 tablet (3.125 mg total) by mouth 2 (two) times daily. 04/27/23   Colin Dawley, MD  cholecalciferol  (VITAMIN D ) 1000 units tablet Take 1,000 Units by mouth daily.    [provider]  cyanocobalamin  (VITAMIN B12) 1000 MCG tablet Take 1,000 mcg by mouth daily.    [provider]  docusate sodium  (COLACE) 100 MG capsule Take 100 mg by mouth 2 (two) times daily.    [provider]  folic acid  (FOLVITE ) 1 MG tablet Take 1 tablet (1 mg total) by mouth daily. 03/17/23   Cook, [name], DO  furosemide   (LASIX ) 40 MG tablet Take 1 tablet (40 mg total) by mouth daily. 05/13/23   Demaris Fillers, MD  latanoprost  (XALATAN ) 0.005 % ophthalmic solution Place 1 drop into both eyes 2 (two) times daily. 08/20/22   Marilyne Shu, NP  melatonin 3 MG TABS tablet Take 3 mg by mouth at bedtime.    [provider]  memantine  (NAMENDA ) 5 MG tablet Take 5 mg by mouth daily.    [provider]  polyethylene glycol (MIRALAX  / GLYCOLAX ) 17 g packet Take 17 g by mouth 2 (two) times daily. 01/31/22   Colin Dawley, MD  rosuvastatin  (CRESTOR ) 5 MG tablet Take 5 mg by mouth daily.    [provider]  senna (SENOKOT) 8.6 MG TABS tablet Take 2 tablets (17.2 mg total) by mouth at bedtime. 04/27/23   Colin Dawley, MD  sertraline (ZOLOFT) 25 MG tablet Take 25 mg by mouth daily.    [provider]  tamsulosin  (FLOMAX ) 0.4 MG CAPS capsule Take 1 capsule (0.4 mg total) by mouth daily. 08/20/22   Marilyne Shu, NP    Physical Exam: Vitals:   06/03/23 1714 06/03/23 1730 06/03/23 1800 06/03/23 1900  BP: (!) 142/82 136/74 (!) 148/81 (!) 141/74  Pulse: (!) 48 (!) 48 (!) 53 (!) 44  Resp:  10 10 12   Temp:      TempSrc:      SpO2: 99% 100% 100% 100%  Weight:      Height:       *** Data Reviewed: {Tip this will not be part of the note when signed- Document your independent interpretation of telemetry tracing, EKG, lab, Radiology test or any other diagnostic tests. Add any new diagnostic test ordered today. (Optional):26781} {Results:26384}  Assessment and Plan: No notes have been filed under this hospital service. Service: Hospitalist     Advance Care Planning:   Code Status: Prior ***  Consults: ***  Family Communication: ***  Severity of Illness: {Observation/Inpatient:21159}  Author: Twilla Galea, DO 06/03/2023 8:00 PM  For on call review [email or web address].

## 2023-06-03 NOTE — ED Notes (Signed)
Pt placed on Bair Hugger 

## 2023-06-03 NOTE — Sepsis Progress Note (Signed)
 Following for sepsis monitoring ?

## 2023-06-03 NOTE — ED Provider Notes (Signed)
  EMERGENCY DEPARTMENT AT [facility] Provider Note   CSN: [id number] Arrival date & time: 06/03/23  1420     History {Add pertinent medical, surgical, social history, OB history to HPI:1} Chief Complaint  Patient presents with   Weakness    Terrance Miller is a 89 y.o. male.  89 year old male history of CAD status post PCI, pulmonary hypertension, aortic insufficiency, vascular dementia, hypertension, hyperlipidemia, diabetes, UTI, and urinary retention who presents emergency department due to concerns for sepsis.  Patient recently hospitalized and discharged on 06/01/2023 for proctitis and sepsis.  Since being discharged back to this facility the workers at his facility reported that he was doing well.  Went to a urology appointment today when he was found to be hypotensive to 88/52 with a heart rate of 48.  They were concerned about sepsis and referred him to the emergency department.  Foley catheter was removed prior to discharge from the hospital recently.  Patient denies any pain at this time and has no complaints.       Home Medications Prior to Admission medications   Medication Sig Start Date End Date Taking? Authorizing Provider  acetaminophen  (TYLENOL ) 325 MG tablet Take 2 tablets (650 mg total) by mouth every 6 (six) hours as needed for mild pain (pain score 1-3) (or Fever >/= 101). Patient taking differently: Take 650 mg by mouth every 6 (six) hours as needed for mild pain (pain score 1-3) (or Fever >/= 101). Do not exceed 3 g in 24 hours 04/27/23   Colin Dawley, MD  albuterol  (PROVENTIL ) (2.5 MG/3ML) 0.083% nebulizer solution Take 2.5 mg by nebulization every 6 (six) hours as needed for wheezing or shortness of breath.    [provider]  amLODipine  (NORVASC ) 10 MG tablet Take 1 tablet (10 mg total) by mouth daily. 04/27/23 04/26/24  Colin Dawley, MD  amoxicillin -clavulanate (AUGMENTIN ) 500-125 MG tablet Take 1 tablet by mouth in the morning  and at bedtime for 4 days. 06/01/23 06/05/23  Wynetta Heckle, MD  aspirin  EC 81 MG tablet Take 1 tablet (81 mg total) by mouth daily with breakfast. Swallow whole. 04/27/23   Colin Dawley, MD  bisacodyl  (DULCOLAX) 10 MG suppository Place 1 suppository (10 mg total) rectally every Monday, Wednesday, and Friday. Patient taking differently: Place 10 mg rectally 3 (three) times a week. Monday, Wednesday, and Friday 01/31/22   Colin Dawley, MD  carvedilol  (COREG ) 3.125 MG tablet Take 1 tablet (3.125 mg total) by mouth 2 (two) times daily. 04/27/23   Colin Dawley, MD  cholecalciferol  (VITAMIN D ) 1000 units tablet Take 1,000 Units by mouth daily.    [provider]  cyanocobalamin  (VITAMIN B12) 1000 MCG tablet Take 1,000 mcg by mouth daily.    [provider]  docusate sodium  (COLACE) 100 MG capsule Take 100 mg by mouth 2 (two) times daily.    [provider]  folic acid  (FOLVITE ) 1 MG tablet Take 1 tablet (1 mg total) by mouth daily. 03/17/23   Cook, [name], DO  furosemide  (LASIX ) 40 MG tablet Take 1 tablet (40 mg total) by mouth daily. 05/13/23   Demaris Fillers, MD  latanoprost  (XALATAN ) 0.005 % ophthalmic solution Place 1 drop into both eyes 2 (two) times daily. 08/20/22   Marilyne Shu, NP  melatonin 3 MG TABS tablet Take 3 mg by mouth at bedtime.    [provider]  memantine  (NAMENDA ) 5 MG tablet Take 5 mg by mouth daily.    [provider]  polyethylene glycol (MIRALAX  / GLYCOLAX ) 17 g packet Take 17 g by mouth 2 (two) times daily. 01/31/22   Colin Dawley, MD  rosuvastatin  (CRESTOR ) 5 MG tablet Take 5 mg by mouth daily.    [provider]  senna (SENOKOT) 8.6 MG TABS tablet Take 2 tablets (17.2 mg total) by mouth at bedtime. 04/27/23   Colin Dawley, MD  sertraline (ZOLOFT) 25 MG tablet Take 25 mg by mouth daily.    [provider]  tamsulosin  (FLOMAX ) 0.4 MG CAPS capsule Take 1 capsule (0.4 mg total) by mouth daily. 08/20/22    Marilyne Shu, NP      Allergies    Clobetasol     Review of Systems   Review of Systems  Physical Exam Updated Vital Signs Ht 6' (1.829 m)   Wt 77.6 kg   BMI 23.19 kg/m  Physical Exam Vitals and nursing note reviewed.  Constitutional:      General: He is not in acute distress.    Appearance: He is well-developed.  HENT:     Head: Normocephalic and atraumatic.     Right Ear: External ear normal.     Left Ear: External ear normal.     Nose: Nose normal.  Eyes:     Extraocular Movements: Extraocular movements intact.     Conjunctiva/sclera: Conjunctivae normal.     Pupils: Pupils are equal, round, and reactive to light.  Cardiovascular:     Rate and Rhythm: Regular rhythm. Bradycardia present.     Heart sounds: Normal heart sounds.  Pulmonary:     Effort: Pulmonary effort is normal. No respiratory distress.     Breath sounds: Normal breath sounds.  Abdominal:     General: There is no distension.     Palpations: Abdomen is soft. There is no mass.     Tenderness: There is no abdominal tenderness. There is no guarding.  Musculoskeletal:     Cervical back: Normal range of motion and neck supple.     Right lower leg: No edema.     Left lower leg: No edema.  Skin:    General: Skin is warm and dry.  Neurological:     Mental Status: He is alert. Mental status is at baseline.  Psychiatric:        Mood and Affect: Mood normal.        Behavior: Behavior normal.     ED Results / Procedures / Treatments   Labs (all labs ordered are listed, but only abnormal results are displayed) Labs Reviewed - No data to display  EKG None  Radiology No results found.  Procedures Procedures  {Document cardiac monitor, telemetry assessment procedure when appropriate:1}  Medications Ordered in ED Medications - No data to display  ED Course/ Medical Decision Making/ A&P Clinical Course as of 06/03/23 1610  Wed Jun 03, 2023  1606 Hemoglobin(!): 9.4 At baseline [RP]  1606  Platelets(!): 55 At baseline [RP]  1606 Creatinine(!): 1.54 Baseline 1.8 [RP]    Clinical Course User Index [RP] Ninetta Basket, MD   {   Click here for ABCD2, HEART and other calculatorsREFRESH Note before signing :1}                              Medical Decision Making Amount and/or Complexity of Data Reviewed Labs: ordered. Decision-making details documented in ED Course. Radiology: ordered.  Risk Prescription drug management.   ***  {Document critical  care time when appropriate:1} {Document review of labs and clinical decision tools ie heart score, Chads2Vasc2 etc:1}  {Document your independent review of radiology images, and any outside records:1} {Document your discussion with family members, caretakers, and with consultants:1} {Document social determinants of health affecting pt's care:1} {Document your decision making why or why not admission, treatments were needed:1} Final Clinical Impression(s) / ED Diagnoses Final diagnoses:  None    Rx / DC Orders ED Discharge Orders     None

## 2023-06-04 ENCOUNTER — Inpatient Hospital Stay (HOSPITAL_COMMUNITY)

## 2023-06-04 DIAGNOSIS — F0390 Unspecified dementia without behavioral disturbance: Secondary | ICD-10-CM | POA: Diagnosis not present

## 2023-06-04 DIAGNOSIS — R1312 Dysphagia, oropharyngeal phase: Secondary | ICD-10-CM | POA: Diagnosis not present

## 2023-06-04 DIAGNOSIS — D696 Thrombocytopenia, unspecified: Secondary | ICD-10-CM | POA: Diagnosis not present

## 2023-06-04 DIAGNOSIS — I252 Old myocardial infarction: Secondary | ICD-10-CM | POA: Diagnosis not present

## 2023-06-04 DIAGNOSIS — I509 Heart failure, unspecified: Secondary | ICD-10-CM | POA: Diagnosis not present

## 2023-06-04 DIAGNOSIS — A0472 Enterocolitis due to Clostridium difficile, not specified as recurrent: Secondary | ICD-10-CM | POA: Diagnosis present

## 2023-06-04 DIAGNOSIS — T68XXXD Hypothermia, subsequent encounter: Secondary | ICD-10-CM | POA: Diagnosis not present

## 2023-06-04 DIAGNOSIS — E1165 Type 2 diabetes mellitus with hyperglycemia: Secondary | ICD-10-CM | POA: Insufficient documentation

## 2023-06-04 DIAGNOSIS — I480 Paroxysmal atrial fibrillation: Secondary | ICD-10-CM | POA: Diagnosis present

## 2023-06-04 DIAGNOSIS — R680 Hypothermia, not associated with low environmental temperature: Secondary | ICD-10-CM | POA: Diagnosis present

## 2023-06-04 DIAGNOSIS — I4891 Unspecified atrial fibrillation: Secondary | ICD-10-CM | POA: Diagnosis not present

## 2023-06-04 DIAGNOSIS — M6282 Rhabdomyolysis: Secondary | ICD-10-CM | POA: Diagnosis not present

## 2023-06-04 DIAGNOSIS — I959 Hypotension, unspecified: Secondary | ICD-10-CM | POA: Diagnosis not present

## 2023-06-04 DIAGNOSIS — M6281 Muscle weakness (generalized): Secondary | ICD-10-CM | POA: Diagnosis not present

## 2023-06-04 DIAGNOSIS — E872 Acidosis, unspecified: Secondary | ICD-10-CM | POA: Diagnosis present

## 2023-06-04 DIAGNOSIS — E782 Mixed hyperlipidemia: Secondary | ICD-10-CM | POA: Diagnosis present

## 2023-06-04 DIAGNOSIS — I5033 Acute on chronic diastolic (congestive) heart failure: Secondary | ICD-10-CM | POA: Diagnosis not present

## 2023-06-04 DIAGNOSIS — E1122 Type 2 diabetes mellitus with diabetic chronic kidney disease: Secondary | ICD-10-CM | POA: Diagnosis present

## 2023-06-04 DIAGNOSIS — F0150 Vascular dementia without behavioral disturbance: Secondary | ICD-10-CM | POA: Diagnosis present

## 2023-06-04 DIAGNOSIS — G9341 Metabolic encephalopathy: Secondary | ICD-10-CM | POA: Diagnosis not present

## 2023-06-04 DIAGNOSIS — K760 Fatty (change of) liver, not elsewhere classified: Secondary | ICD-10-CM | POA: Diagnosis not present

## 2023-06-04 DIAGNOSIS — E876 Hypokalemia: Secondary | ICD-10-CM | POA: Diagnosis present

## 2023-06-04 DIAGNOSIS — R001 Bradycardia, unspecified: Secondary | ICD-10-CM | POA: Insufficient documentation

## 2023-06-04 DIAGNOSIS — I69891 Dysphagia following other cerebrovascular disease: Secondary | ICD-10-CM | POA: Diagnosis not present

## 2023-06-04 DIAGNOSIS — Z66 Do not resuscitate: Secondary | ICD-10-CM | POA: Diagnosis present

## 2023-06-04 DIAGNOSIS — I130 Hypertensive heart and chronic kidney disease with heart failure and stage 1 through stage 4 chronic kidney disease, or unspecified chronic kidney disease: Secondary | ICD-10-CM | POA: Diagnosis present

## 2023-06-04 DIAGNOSIS — K4020 Bilateral inguinal hernia, without obstruction or gangrene, not specified as recurrent: Secondary | ICD-10-CM | POA: Diagnosis not present

## 2023-06-04 DIAGNOSIS — N281 Cyst of kidney, acquired: Secondary | ICD-10-CM | POA: Diagnosis not present

## 2023-06-04 DIAGNOSIS — N400 Enlarged prostate without lower urinary tract symptoms: Secondary | ICD-10-CM | POA: Diagnosis present

## 2023-06-04 DIAGNOSIS — R278 Other lack of coordination: Secondary | ICD-10-CM | POA: Diagnosis not present

## 2023-06-04 DIAGNOSIS — I272 Pulmonary hypertension, unspecified: Secondary | ICD-10-CM | POA: Diagnosis present

## 2023-06-04 DIAGNOSIS — I5032 Chronic diastolic (congestive) heart failure: Secondary | ICD-10-CM | POA: Diagnosis present

## 2023-06-04 DIAGNOSIS — I2510 Atherosclerotic heart disease of native coronary artery without angina pectoris: Secondary | ICD-10-CM | POA: Diagnosis not present

## 2023-06-04 DIAGNOSIS — D472 Monoclonal gammopathy: Secondary | ICD-10-CM | POA: Diagnosis present

## 2023-06-04 DIAGNOSIS — K8020 Calculus of gallbladder without cholecystitis without obstruction: Secondary | ICD-10-CM | POA: Diagnosis not present

## 2023-06-04 DIAGNOSIS — D539 Nutritional anemia, unspecified: Secondary | ICD-10-CM | POA: Diagnosis present

## 2023-06-04 DIAGNOSIS — N1832 Chronic kidney disease, stage 3b: Secondary | ICD-10-CM | POA: Diagnosis not present

## 2023-06-04 DIAGNOSIS — K828 Other specified diseases of gallbladder: Secondary | ICD-10-CM | POA: Diagnosis not present

## 2023-06-04 DIAGNOSIS — Z7982 Long term (current) use of aspirin: Secondary | ICD-10-CM | POA: Diagnosis not present

## 2023-06-04 LAB — COMPREHENSIVE METABOLIC PANEL WITH GFR
ALT: 22 U/L (ref 0–44)
AST: 28 U/L (ref 15–41)
Albumin: 2.5 g/dL — ABNORMAL LOW (ref 3.5–5.0)
Alkaline Phosphatase: 59 U/L (ref 38–126)
Anion gap: 9 (ref 5–15)
BUN: 30 mg/dL — ABNORMAL HIGH (ref 8–23)
CO2: 22 mmol/L (ref 22–32)
Calcium: 8.9 mg/dL (ref 8.9–10.3)
Chloride: 115 mmol/L — ABNORMAL HIGH (ref 98–111)
Creatinine, Ser: 1.34 mg/dL — ABNORMAL HIGH (ref 0.61–1.24)
GFR, Estimated: 50 mL/min — ABNORMAL LOW (ref >60)
Glucose, Bld: 70 mg/dL (ref 70–99)
Potassium: 3.4 mmol/L — ABNORMAL LOW (ref 3.5–5.1)
Sodium: 146 mmol/L — ABNORMAL HIGH (ref 135–145)
Total Bilirubin: 0.9 mg/dL (ref 0.0–1.2)
Total Protein: 6 g/dL — ABNORMAL LOW (ref 6.5–8.1)

## 2023-06-04 LAB — IRON AND TIBC
Iron: 103 ug/dL (ref 45–182)
Saturation Ratios: 59 % — ABNORMAL HIGH (ref 17.9–39.5)
TIBC: 175 ug/dL — ABNORMAL LOW (ref 250–450)
UIBC: 72 ug/dL

## 2023-06-04 LAB — CBC
HCT: 22.2 % — ABNORMAL LOW (ref 39.0–52.0)
Hemoglobin: 7.3 g/dL — ABNORMAL LOW (ref 13.0–17.0)
MCH: 33.2 pg (ref 26.0–34.0)
MCHC: 32.9 g/dL (ref 30.0–36.0)
MCV: 100.9 fL — ABNORMAL HIGH (ref 80.0–100.0)
Platelets: 54 10*3/uL — ABNORMAL LOW (ref 150–400)
RBC: 2.2 MIL/uL — ABNORMAL LOW (ref 4.22–5.81)
RDW: 21.4 % — ABNORMAL HIGH (ref 11.5–15.5)
WBC: 5.3 10*3/uL (ref 4.0–10.5)
nRBC: 0.4 % — ABNORMAL HIGH (ref 0.0–0.2)

## 2023-06-04 LAB — GLUCOSE, CAPILLARY
Glucose-Capillary: 104 mg/dL — ABNORMAL HIGH (ref 70–99)
Glucose-Capillary: 64 mg/dL — ABNORMAL LOW (ref 70–99)
Glucose-Capillary: 70 mg/dL (ref 70–99)
Glucose-Capillary: 71 mg/dL (ref 70–99)
Glucose-Capillary: 74 mg/dL (ref 70–99)
Glucose-Capillary: 90 mg/dL (ref 70–99)

## 2023-06-04 LAB — PHOSPHORUS: Phosphorus: 2.3 mg/dL — ABNORMAL LOW (ref 2.5–4.6)

## 2023-06-04 LAB — MAGNESIUM: Magnesium: 1.8 mg/dL (ref 1.7–2.4)

## 2023-06-04 LAB — T4, FREE: Free T4: 0.91 ng/dL (ref 0.61–1.12)

## 2023-06-04 LAB — FOLATE: Folate: 16.7 ng/mL (ref >5.9)

## 2023-06-04 LAB — PROCALCITONIN: Procalcitonin: 0.18 ng/mL

## 2023-06-04 LAB — MRSA NEXT GEN BY PCR, NASAL: MRSA by PCR Next Gen: DETECTED — AB

## 2023-06-04 LAB — VITAMIN B12: Vitamin B-12: 1373 pg/mL — ABNORMAL HIGH (ref 180–914)

## 2023-06-04 LAB — LACTIC ACID, PLASMA: Lactic Acid, Venous: 1 mmol/L (ref 0.5–1.9)

## 2023-06-04 LAB — FERRITIN: Ferritin: 725 ng/mL — ABNORMAL HIGH (ref 24–336)

## 2023-06-04 LAB — CORTISOL-AM, BLOOD: Cortisol - AM: 8.1 ug/dL (ref 6.7–22.6)

## 2023-06-04 MED ORDER — ACETAMINOPHEN 650 MG RE SUPP: 650.0000 mg | Freq: Four times a day (QID) | RECTAL | Status: DC | PRN

## 2023-06-04 MED ORDER — IOHEXOL 9 MG/ML PO SOLN
ORAL | Status: AC
  Filled 2023-06-04: qty 1000

## 2023-06-04 MED ORDER — SERTRALINE HCL 50 MG PO TABS
25.0000 mg | ORAL_TABLET | Freq: Every day | ORAL | Status: DC
  Administered 2023-06-04 – 2023-06-08 (×5): 25 mg via ORAL
  Filled 2023-06-04 (×5): qty 1

## 2023-06-04 MED ORDER — ONDANSETRON HCL 4 MG PO TABS: 4.0000 mg | ORAL_TABLET | Freq: Four times a day (QID) | ORAL | Status: DC | PRN

## 2023-06-04 MED ORDER — LACTATED RINGERS IV SOLN: INTRAVENOUS | Status: AC

## 2023-06-04 MED ORDER — ASPIRIN 81 MG PO TBEC
81.0000 mg | DELAYED_RELEASE_TABLET | Freq: Every day | ORAL | Status: DC
  Administered 2023-06-04 – 2023-06-08 (×5): 81 mg via ORAL
  Filled 2023-06-04 (×5): qty 1

## 2023-06-04 MED ORDER — ONDANSETRON HCL 4 MG/2ML IJ SOLN
4.0000 mg | Freq: Four times a day (QID) | INTRAMUSCULAR | Status: DC | PRN
Start: 2023-06-04 — End: 2023-06-08

## 2023-06-04 MED ORDER — FOLIC ACID 1 MG PO TABS
1.0000 mg | ORAL_TABLET | Freq: Every day | ORAL | Status: DC
  Filled 2023-06-04: qty 1

## 2023-06-04 MED ORDER — COSYNTROPIN 0.25 MG IJ SOLR
0.2500 mg | Freq: Once | INTRAMUSCULAR | Status: AC
  Administered 2023-06-05: 0.25 mg via INTRAVENOUS
  Filled 2023-06-04: qty 0.25

## 2023-06-04 MED ORDER — MEMANTINE HCL 10 MG PO TABS
5.0000 mg | ORAL_TABLET | Freq: Every day | ORAL | Status: DC
  Administered 2023-06-04 – 2023-06-08 (×5): 5 mg via ORAL
  Filled 2023-06-04 (×5): qty 1

## 2023-06-04 MED ORDER — SODIUM CHLORIDE 0.9 % IV SOLN
2.0000 g | Freq: Two times a day (BID) | INTRAVENOUS | Status: DC
  Administered 2023-06-04 – 2023-06-06 (×5): 2 g via INTRAVENOUS
  Filled 2023-06-04 (×5): qty 12.5

## 2023-06-04 MED ORDER — VITAMIN B-12 1000 MCG PO TABS
1000.0000 ug | ORAL_TABLET | Freq: Every day | ORAL | Status: DC
  Administered 2023-06-05 – 2023-06-08 (×4): 1000 ug via ORAL
  Filled 2023-06-04 (×5): qty 1

## 2023-06-04 MED ORDER — VANCOMYCIN HCL IN DEXTROSE 1-5 GM/200ML-% IV SOLN
1000.0000 mg | INTRAVENOUS | Status: DC
  Administered 2023-06-04: 1000 mg via INTRAVENOUS
  Filled 2023-06-04: qty 200

## 2023-06-04 MED ORDER — MUPIROCIN 2 % EX OINT
1.0000 | TOPICAL_OINTMENT | Freq: Two times a day (BID) | CUTANEOUS | Status: DC
Start: 2023-06-04 — End: 2023-06-09
  Administered 2023-06-04 – 2023-06-08 (×9): 1 via NASAL
  Filled 2023-06-04 (×3): qty 22

## 2023-06-04 MED ORDER — FOLIC ACID 5 MG/ML IJ SOLN
1.0000 mg | Freq: Every day | INTRAMUSCULAR | Status: DC
  Administered 2023-06-04 – 2023-06-08 (×5): 1 mg via INTRAVENOUS
  Filled 2023-06-04 (×6): qty 0.2

## 2023-06-04 MED ORDER — SODIUM CHLORIDE 0.9 % IV SOLN: 1.0000 mg | Freq: Every day | INTRAVENOUS | Status: DC

## 2023-06-04 MED ORDER — IOHEXOL 300 MG/ML  SOLN
100.0000 mL | Freq: Once | INTRAMUSCULAR | Status: AC | PRN
  Administered 2023-06-04: 100 mL via INTRAVENOUS

## 2023-06-04 MED ORDER — POTASSIUM CHLORIDE IN NACL 20-0.45 MEQ/L-% IV SOLN
INTRAVENOUS | Status: DC
  Filled 2023-06-04 (×2): qty 1000

## 2023-06-04 MED ORDER — ACETAMINOPHEN 325 MG PO TABS: 650.0000 mg | ORAL_TABLET | Freq: Four times a day (QID) | ORAL | Status: DC | PRN

## 2023-06-04 MED ORDER — TAMSULOSIN HCL 0.4 MG PO CAPS
0.4000 mg | ORAL_CAPSULE | Freq: Every day | ORAL | Status: DC
  Administered 2023-06-04 – 2023-06-08 (×5): 0.4 mg via ORAL
  Filled 2023-06-04 (×5): qty 1

## 2023-06-04 MED ORDER — MELATONIN 3 MG PO TABS
3.0000 mg | ORAL_TABLET | Freq: Every day | ORAL | Status: DC
  Administered 2023-06-04: 3 mg via ORAL
  Filled 2023-06-04: qty 1

## 2023-06-04 MED ORDER — ROSUVASTATIN CALCIUM 10 MG PO TABS
5.0000 mg | ORAL_TABLET | Freq: Every day | ORAL | Status: DC
  Administered 2023-06-04 – 2023-06-08 (×5): 5 mg via ORAL
  Filled 2023-06-04 (×5): qty 1

## 2023-06-04 NOTE — Progress Notes (Signed)
 Patient's temperature is fluctuating and is currently 36.1 C per temp. foley. Patient is asymptomatic and has no complaints at this time. Multiple warm blankets given to patient. Dr. Winferd Hatter has been made aware and has advised to leave the temp. foley in for now to closely monitor temperature.

## 2023-06-04 NOTE — TOC Initial Note (Signed)
 Transition of Care [facility]) - Initial/Assessment Note    Patient Details  Name: Terrance Miller MRN: [id number] Date of Birth: 02-03-1928  Transition of Care [facility]) CM/SW Contact:    Grandville Lax, LCSWA Phone Number: 06/04/2023, 10:11 AM  Clinical Narrative:                 CSW notes per chart review that pt arrived from [facility]. CSW spoke with Stephenie Einstein in admissions who states pt is a resident at their facility and can return once medically stable. TOC to follow.   Expected Discharge Plan: Skilled Nursing Facility Barriers to Discharge: Continued Medical Work up   Patient Goals and CMS Choice Patient states their goals for this hospitalization and ongoing recovery are:: return to SNF CMS Medicare.gov Compare Post Acute Care list provided to:: Patient Represenative (must comment) Choice offered to / list presented to : Adult Children      Expected Discharge Plan and Services In-house Referral: Clinical Social Work Discharge Planning Services: CM Consult Post Acute Care Choice: Skilled Nursing Facility Living arrangements for the past 2 months: Skilled Nursing Facility                                      Prior Living Arrangements/Services Living arrangements for the past 2 months: Skilled Nursing Facility Lives with:: Facility Resident Patient language and need for interpreter reviewed:: Yes Do you feel safe going back to the place where you live?: Yes      Need for Family Participation in Patient Care: Yes (Comment) Care giver support system in place?: Yes (comment)   Criminal Activity/Legal Involvement Pertinent to Current Situation/Hospitalization: No - Comment as needed  Activities of Daily Living   ADL Screening (condition at time of admission) Independently performs ADLs?: No Does the patient have a NEW difficulty with bathing/dressing/toileting/self-feeding that is expected to last >3 days?: Yes (Initiates electronic notice to provider for possible OT  consult) Does the patient have a NEW difficulty with getting in/out of bed, walking, or climbing stairs that is expected to last >3 days?: Yes (Initiates electronic notice to provider for possible PT consult) Does the patient have a NEW difficulty with communication that is expected to last >3 days?: Yes (Initiates electronic notice to provider for possible SLP consult) Is the patient deaf or have difficulty hearing?: Yes Does the patient have difficulty seeing, even when wearing glasses/contacts?: No Does the patient have difficulty concentrating, remembering, or making decisions?: Yes  Permission Sought/Granted                  Emotional Assessment         Alcohol / Substance Use: Not Applicable Psych Involvement: No (comment)  Admission diagnosis:  New onset atrial fibrillation (HCC) [I48.91] Hypotension [I95.9] Patient Active Problem List   Diagnosis Date Noted   Bradycardia 06/04/2023   Hypotension 06/04/2023   Lactic acidosis 06/04/2023   Type 2 diabetes mellitus with hyperglycemia (HCC) 06/04/2023   New onset atrial fibrillation (HCC) 06/03/2023   Dementia with behavioral disturbance (HCC) 05/29/2023   Acute on chronic heart failure with preserved ejection fraction (HFpEF) (HCC) 05/10/2023   Stercoral colitis 05/04/2023   (HFpEF) heart failure with preserved ejection fraction (HCC) 05/04/2023   Nausea with vomiting 05/04/2023   DNR (do not resuscitate) 05/04/2023   FTT (failure to thrive) in adult 05/04/2023   UTI (urinary tract infection) 04/21/2023   Thrombocytopenia (HCC)  04/21/2023   Acute on chronic diastolic CHF (congestive heart failure) (HCC) 04/21/2023   Obesity, Class III, BMI 40-49.9 (morbid obesity) 04/21/2023   Injury to scrotum, penis, or foreskin 03/18/2023   Type 2 diabetes mellitus with chronic kidney disease, with long-term current use of insulin  (HCC) 12/20/2022   Glaucoma 12/20/2022   Vascular dementia without behavioral disturbance (HCC)  08/15/2022   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   Aortic atherosclerosis (HCC) 08/05/2022   Chronic constipation 08/05/2022   Increased intraocular pressure, bilateral 08/05/2022   Edema, peripheral 08/05/2022   Aortic regurgitation 03/17/2022   Pulmonary HTN (HCC) 03/17/2022   Dry skin dermatitis 02/05/2022   Stage 3b chronic kidney disease (HCC) 11/05/2021   MGUS (monoclonal gammopathy of unknown significance) 11/05/2021   Acute metabolic encephalopathy 07/27/2021   Macrocytic anemia 05/31/2021   History of MI (myocardial infarction) 04/29/2021   Hypothermia 05/20/2017   Severe sepsis with septic shock (HCC) 05/20/2017   Essential hypertension 09/28/2013   Type 2 diabetes mellitus with hyperlipidemia (HCC) 09/28/2013   CAD (coronary artery disease) 09/28/2013   PCP:  System, Provider Not In Pharmacy:   [facility] - [place], [place] - [address] [place] [address] Phone: [phone] Fax: [phone]  [facility] - [place], [place] - [address] [place] [address] Phone: [phone] Fax: [phone]     Social Drivers of Health (SDOH) Social History: SDOH Screenings   Food Insecurity: No Food Insecurity (06/03/2023)  Housing: Low Risk  (06/03/2023)  Transportation Needs: No Transportation Needs (06/03/2023)  Utilities: Not At Risk (06/03/2023)  Alcohol Screen: Low Risk  (01/05/2023)  Depression (PHQ2-9): Medium Risk (03/17/2023)  Financial Resource Strain: Low Risk  (01/05/2023)  Physical Activity: Inactive (01/05/2023)  Social Connections: Unknown (06/03/2023)  Recent Concern: Social Connections - Socially Isolated (05/04/2023)  Stress: No Stress Concern Present (01/05/2023)  Tobacco Use: Medium Risk (06/03/2023)  Health Literacy: Adequate Health Literacy (01/05/2023)   SDOH Interventions:     Readmission Risk Interventions    04/24/2023    1:31 PM 04/23/2023    11:23 AM 04/22/2023   10:47 AM  Readmission Risk Prevention Plan  Transportation Screening Complete Complete Complete  HRI or Home Care Consult Complete Complete Complete  Social Work Consult for Recovery Care Planning/Counseling Complete Complete Complete  Palliative Care Screening Complete Complete Complete  Medication Review Oceanographer) Complete Complete Complete

## 2023-06-04 NOTE — Progress Notes (Signed)
 Pharmacy Antibiotic Note  Terrance Miller is a 89 y.o. male admitted on 06/03/2023 with sepsis.  Pharmacy has been consulted for vancomycin  and cefepime  dosing.  Patient with tmax of 99.9, wbc normal. Scr trending down 1.34.  Vancomycin  1g IV Q 24 hrs. Goal AUC 400-550. Expected AUC: 482 SCr used: 1.34   Plan: Vancomycin  1g IV q24 hours Cefepime  2g q12 hours Follow up vancomycin  levels as indicated  Height: 6' (182.9 cm) Weight: 78.9 kg (173 lb 15.1 oz) IBW/kg (Calculated) : 77.6  Temp (24hrs), Avg:96.1 F (35.6 C), Min:89.1 F (31.7 C), Max:99.9 F (37.7 C)  Recent Labs  Lab 05/30/23 0338 05/31/23 0523 06/01/23 0703 06/03/23 1510 06/03/23 1742 06/04/23 0133 06/04/23 0446  WBC 7.6 4.9 6.3 5.0  --   --  5.3  CREATININE 1.83* 1.92* 2.03* 1.54*  --   --  1.34*  LATICACIDVEN  --   --   --  2.2* 2.2* 1.0  --     Estimated Creatinine Clearance: 38.6 mL/min (A) (by C-G formula based on SCr of 1.34 mg/dL (H)).    Allergies  Allergen Reactions   Clobetasol  Other (See Comments)    Unknown  No reaction listed on MAR from facility   Thank you for allowing pharmacy to be a part of this patient's care.  Audra Blend PharmD., BCPS Clinical Pharmacist 06/04/2023 8:42 AM

## 2023-06-04 NOTE — Plan of Care (Signed)

## 2023-06-04 NOTE — Plan of Care (Signed)
  Problem: Acute Rehab PT Goals(only PT should resolve) Goal: Pt Will Go Supine/Side To Sit Outcome: Progressing Flowsheets (Taken 06/04/2023 1557) Pt will go Supine/Side to Sit: with minimal assist Goal: Patient Will Transfer Sit To/From Stand Outcome: Progressing Flowsheets (Taken 06/04/2023 1557) Patient will transfer sit to/from stand: with minimal assist Goal: Pt Will Transfer Bed To Chair/Chair To Bed Outcome: Progressing Flowsheets (Taken 06/04/2023 1557) Pt will Transfer Bed to Chair/Chair to Bed: with min assist Goal: Pt Will Ambulate Outcome: Progressing Flowsheets (Taken 06/04/2023 1557) Pt will Ambulate:  25 feet  with minimal assist  with moderate assist  with rolling walker   3:58 PM, 06/04/23 Walton Guppy, MPT Physical Therapist with [facility] [phone] office [phone] mobile phone

## 2023-06-04 NOTE — Evaluation (Signed)
 Clinical/Bedside Swallow Evaluation Patient Details  Name: Terrance Miller MRN: [id number] Date of Birth: 03/10/1928  Today's Date: 06/04/2023 Time: SLP Start Time (ACUTE ONLY): 1422 SLP Stop Time (ACUTE ONLY): 1448 SLP Time Calculation (min) (ACUTE ONLY): 26 min  Past Medical History:  Past Medical History:  Diagnosis Date   Acute ST elevation myocardial infarction (STEMI) of inferior wall (HCC) 2004   Coronary artery disease    DES x2 to the RCA 2004, residual disease managed medically   Hyperlipidemia    Hypertension    Type 2 diabetes mellitus (HCC)    Past Surgical History:  Past Surgical History:  Procedure Laterality Date   BIOPSY  01/28/2022   Procedure: BIOPSY;  Surgeon: Umberto Ganong, Bearl Limes, MD;  Location: [facility];  Service: Gastroenterology;;   CATARACT EXTRACTION Bilateral    CORONARY ANGIOPLASTY WITH STENT PLACEMENT  09/05/2002   stent RCA, 80% first diagonal, 70-80% mid-diagonal, 80% mid LAD stenosis   FLEXIBLE SIGMOIDOSCOPY N/A 01/28/2022   Procedure: FLEXIBLE SIGMOIDOSCOPY;  Surgeon: Urban Garden, MD;  Location: [facility];  Service: Gastroenterology;  Laterality: N/A;   HYDROCELE EXCISION Bilateral 11/20/2021   Procedure: HYDROCELECTOMY ADULT;  Surgeon: Mellie Sprinkle., MD;  Location: [facility];  Service: Urology;  Laterality: Bilateral;   IMPACTION REMOVAL  01/28/2022   Procedure: IMPACTION REMOVAL;  Surgeon: Umberto Ganong, Bearl Limes, MD;  Location: [facility];  Service: Gastroenterology;;   NM MYOCAR PERF WALL MOTION  11/01/2008   Normal   HPI:  89 year old male with a history of dementia, HFpEF, coronary disease, hypertension, hyperlipidemia, MGUS, CKD stage III, diet-controlled diabetes mellitus type 2, and thrombocytopenia history of fecal impaction removal in January 2024 by Dr. Sammi Crick, BPH with history of hydrocelectomy by Dr. Oda Bence presenting with hypotension and bradycardia.  The patient was an appointment at the  urology office when he was noted to have a blood pressure of 88/52 and bradycardic.  Because of concerns for sepsis, he was sent to the emergency department for further evaluation and treatment. Was hypothermic upon arrival to the ED with temperatures low as 89.1 and Bair Hugger applied and temp normalized. Initial labs showed WBC 5.0, Hgb 9.4, platelets 55K, Na+ 142, K+ 3.8 and creatinine 1.54 (previously 2.03 on 06/01/2023). AST 36 and ALT 30. Initial lactic acid at 2.2 with repeat at 2.2 and 1.0. TSH normal at 4.137. Blood cultures pending. CXR with no active disease. Initial EKG showed atrial fibrillation with slow ventricular response, heart rate 48. Pt had BSE last week during his last hospitalization and was placed on D3/thin. BSE requested again this admission.    Assessment / Plan / Recommendation  Clinical Impression  Pt known to SLP service from previous admission last week and BSE completed with recommendation for D3/thin. Pt alert, pleasantly confused, and talkative today. Pt with mild wet vocal quality, however suspect it is due to reduced frequency of spontaneous swallows due to altered mental status. Once Pt does eventually swallow or when cued to cough and swallow, vocal quality improves. He was assessed with ice chips, thin water  via cup/straw, puree, and mech soft textures (limited amount) and did not exhibit signs or symptoms of aspiration and no reports of globus. Pt is at mild risk for aspiration due to cognitive deficits (talking while eating), however risks can be minimized with soft textures (sparse dentition) and cues to refrain from talking while eating. Recommend D3/ mech soft and thin liquids with PO medication whole or crushed as able in puree when  Pt is alert and upright, feeder assist. Above to RN. SLP will sign off. Reconsult if indicated.   SLP Visit Diagnosis: Dysphagia, unspecified (R13.10)    Aspiration Risk  Mild aspiration risk    Diet Recommendation Dysphagia 3 (Mech  soft);Thin liquid    Liquid Administration via: Cup;Straw Medication Administration: Whole meds with puree Supervision: Staff to assist with self feeding Compensations: Slow rate;Small sips/bites Postural Changes: Seated upright at 90 degrees;Remain upright for at least 30 minutes after po intake    Other  Recommendations Oral Care Recommendations: Oral care BID    Recommendations for follow up therapy are one component of a multi-disciplinary discharge planning process, led by the attending physician.  Recommendations may be updated based on patient status, additional functional criteria and insurance authorization.  Follow up Recommendations No SLP follow up      Assistance Recommended at Discharge    Functional Status Assessment Patient has not had a recent decline in their functional status  Frequency and Duration            Prognosis Prognosis for improved oropharyngeal function: Fair Barriers to Reach Goals: Cognitive deficits      Swallow Study   General Date of Onset: 06/03/23 HPI: 89 year old male with a history of dementia, HFpEF, coronary disease, hypertension, hyperlipidemia, MGUS, CKD stage III, diet-controlled diabetes mellitus type 2, and thrombocytopenia history of fecal impaction removal in January 2024 by Dr. Sammi Crick, BPH with history of hydrocelectomy by Dr. Oda Bence presenting with hypotension and bradycardia.  The patient was an appointment at the urology office when he was noted to have a blood pressure of 88/52 and bradycardic.  Because of concerns for sepsis, he was sent to the emergency department for further evaluation and treatment. Was hypothermic upon arrival to the ED with temperatures low as 89.1 and Bair Hugger applied and temp normalized. Initial labs showed WBC 5.0, Hgb 9.4, platelets 55K, Na+ 142, K+ 3.8 and creatinine 1.54 (previously 2.03 on 06/01/2023). AST 36 and ALT 30. Initial lactic acid at 2.2 with repeat at 2.2 and 1.0. TSH normal at  4.137. Blood cultures pending. CXR with no active disease. Initial EKG showed atrial fibrillation with slow ventricular response, heart rate 48. Pt had BSE last week during his last hospitalization and was placed on D3/thin. BSE requested again this admission. Type of Study: Bedside Swallow Evaluation Previous Swallow Assessment: BSE last week D3/thin Diet Prior to this Study: Thin liquids (Level 0);Dysphagia 3 (mechanical soft) Temperature Spikes Noted: No Respiratory Status: Room air History of Recent Intubation: No Behavior/Cognition: Alert;Cooperative;Confused;Requires cueing Oral Cavity Assessment: Within Functional Limits Oral Care Completed by SLP: Yes Oral Cavity - Dentition: Edentulous (sparse dentition) Vision: Impaired for self-feeding Self-Feeding Abilities: Total assist Patient Positioning: Upright in bed Baseline Vocal Quality: Normal Volitional Cough: Congested;Strong Volitional Swallow: Unable to elicit    Oral/Motor/Sensory Function Overall Oral Motor/Sensory Function: Within functional limits   Ice Chips Ice chips: Within functional limits Presentation: Spoon   Thin Liquid Thin Liquid: Within functional limits Presentation: Cup;Straw    Nectar Thick Nectar Thick Liquid: Not tested   Honey Thick Honey Thick Liquid: Not tested   Puree Puree: Within functional limits Presentation: Spoon   Solid     Solid: Impaired Presentation: Spoon Oral Phase Impairments: Poor awareness of bolus Oral Phase Functional Implications: Prolonged oral transit     Thank you,  Claudetta Cuba, CCC-SLP [phone]  Radhika Dershem 06/04/2023,2:51 PM
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
## 2023-06-04 NOTE — Hospital Course (Addendum)
 89 year old male with a history of dementia, HFpEF, coronary disease, hypertension, hyperlipidemia, MGUS, CKD stage III, diet-controlled diabetes mellitus type 2, and thrombocytopenia history of fecal impaction removal in January 2024 by Dr. Sammi Crick, BPH with history of hydrocelectomy by Dr. Oda Bence presenting with hypotension and bradycardia. The patient was an appointment at the urology office when he was noted to have a blood pressure of 88/52 and bradycardic.  Because of concerns for sepsis, he was sent to the emergency department for further evaluation and treatment.  In the ED, the patient became hypothermic down to 89.7 F.  He was hemodynamically stable.  Oxygen saturation 100% room air.  WBC 5.0, hemoglobin 9.4, platelets 55,000.  Sodium 146, potassium 3.4, bicarbonate 22, serum creatinine 1.34.  LFTs unremarkable.  Lactic acid 2.2>> 1.0.  UA was negative for pyuria.  Chest x-ray was negative for infiltrates.  The patient was started on vancomycin  and cefepime .  Notably, the patient has had multiple recent hospitalizations:  4/15 to 04/27/23--hypothermia with Enterobacter cloacae UTI  05/04/2023 to 05/12/2023--sepsis secondary to stercoral colitis and acute on chronic HFpEF  05/28/23 to 06/01/23--hypothermia and proctitis

## 2023-06-04 NOTE — Progress Notes (Signed)
 CBG result was 64. Gave the patient 4 oz. of juice and recheck was 74. Patient was asymptomatic. No complaints at this time and all questions answered. Dr. Winferd Hatter has been made aware.

## 2023-06-04 NOTE — Progress Notes (Signed)
 Per Dr. Winferd Hatter, apply warming blanket for temperature less than 96.

## 2023-06-04 NOTE — Evaluation (Signed)
 Physical Therapy Evaluation Patient Details Name: Terrance Miller MRN: [id number] DOB: [birth year] Today's Date: 06/04/2023  History of Present Illness  Terrance Miller is a 89 y.o. male with medical history significant of hypertension, hyperlipidemia, CAD s/p PCI, dementia who presents to the emergency department due to concern for sepsis.   Patient was admitted in 05/22 to 5/26 due to proctitis and hypothermia.  He was treated with IV Zosyn  and then transition to Augmentin  on discharge.  Apparently, patient has been doing well since discharge to SNF per ED Medical record.  He had a follow-up appointment with urology today and was noted to have a BP of 88/52 with HR of 48, so he was sent to the emergency department due to concern for sepsis.     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  Unfortunately, his overall health status   Clinical Impression  Patient demonstrates slow labored movement for sitting up at bedside, once seated able to maintain sitting balance, Mod assist for standing and taking steps at bedside, but very unsteady on feet and unsafe to walk away from bedside.  Patient tolerated sitting up in chair after therapy. Patient will benefit from continued skilled physical therapy in hospital and recommended venue below to increase strength, balance, endurance for safe ADLs and gait.           If plan is discharge home, recommend the following: A lot of help with bathing/dressing/bathroom;A lot of help with walking and/or transfers;Help with stairs or ramp for entrance;Assistance with cooking/housework   Can travel by private vehicle   No    Equipment Recommendations None recommended by PT  Recommendations for Other Services       Functional Status Assessment Patient has had a recent decline in their functional status and demonstrates the ability to make significant  improvements in function in a reasonable and predictable amount of time.     Precautions / Restrictions Precautions Precautions: Fall Recall of Precautions/Restrictions: Intact Restrictions Weight Bearing Restrictions Per Provider Order: No      Mobility  Bed Mobility Overal bed mobility: Needs Assistance Bed Mobility: Supine to Sit     Supine to sit: Mod assist, Max assist     General bed mobility comments: increased time, labored movement    Transfers Overall transfer level: Needs assistance Equipment used: Rolling walker (2 wheels) Transfers: Sit to/from Stand, Bed to chair/wheelchair/BSC Sit to Stand: Mod assist   Step pivot transfers: Mod assist       General transfer comment: unsteady labored movement    Ambulation/Gait Ambulation/Gait assistance: Mod assist Gait Distance (Feet): 4 Feet Assistive device: Rolling walker (2 wheels) Gait Pattern/deviations: Decreased step length - right, Decreased step length - left, Decreased stride length Gait velocity: slow     General Gait Details: slow labored movement limited mostly due to fatigue  Stairs            Wheelchair Mobility     Tilt Bed    Modified Rankin (Stroke Patients Only)       Balance Overall balance assessment: Needs assistance Sitting-balance support: Feet supported, No upper extremity supported Sitting balance-Leahy Scale: Fair Sitting balance - Comments: fair/good seated at EOB   Standing balance support: Reliant on assistive device for balance, During functional activity, Bilateral upper extremity supported Standing balance-Leahy Scale: Poor Standing balance comment: fair/poor using RW  Pertinent Vitals/Pain Pain Assessment Pain Assessment: Faces Faces Pain Scale: Hurts little more Pain Location: chronic pain in toes Pain Intervention(s): Limited activity within patient's tolerance, Monitored during session, Repositioned    Home  Living Family/patient expects to be discharged to:: Skilled nursing facility Living Arrangements: Children Available Help at Discharge: Family;Available 24 hours/day Type of Home: House Home Access: Stairs to enter Entrance Stairs-Rails: None Entrance Stairs-Number of Steps: 3 Alternate Level Stairs-Number of Steps: 4 Home Layout: Able to live on main level with bedroom/bathroom;Two level Home Equipment: Toilet riser;Cane - single point;Rolling Walker (2 wheels);Rollator (4 wheels)      Prior Function Prior Level of Function : Needs assist  Cognitive Assist : Mobility (cognitive)     Physical Assist : Mobility (physical);ADLs (physical) Mobility (physical): Transfers;Gait;Bed mobility;Stairs ADLs (physical): IADLs;Dressing;Bathing Mobility Comments: household ambulator using RW. pt reports assist from daughter and son in law that pt given assist for sit/stand transfers and ambulates independently with RW. ADLs Comments: Assisted with bathing and dressing per pt's reports. Chart review indicates assist for IADL's as well. Pt confirms.     Extremity/Trunk Assessment   Upper Extremity Assessment Upper Extremity Assessment: Defer to OT evaluation    Lower Extremity Assessment Lower Extremity Assessment: Generalized weakness    Cervical / Trunk Assessment Cervical / Trunk Assessment: Normal  Communication   Communication Communication: Impaired Factors Affecting Communication: Difficulty expressing self    Cognition Arousal: Alert Behavior During Therapy: Flat affect   PT - Cognitive impairments: History of cognitive impairments                         Following commands: Impaired Following commands impaired: Follows one step commands with increased time     Cueing Cueing Techniques: Verbal cues, Tactile cues     General Comments      Exercises     Assessment/Plan    PT Assessment Patient needs continued PT services  PT Problem List Decreased  strength;Decreased activity tolerance;Decreased balance;Decreased mobility       PT Treatment Interventions DME instruction;Gait training;Stair training;Functional mobility training;Therapeutic activities;Therapeutic exercise;Balance training;Patient/family education    PT Goals (Current goals can be found in the Care Plan section)  Acute Rehab PT Goals Patient Stated Goal: return home with family to assist PT Goal Formulation: With patient Time For Goal Achievement: 06/18/23 Potential to Achieve Goals: Good    Frequency Min 3X/week     Co-evaluation               AM-PAC PT "6 Clicks" Mobility  Outcome Measure Help needed turning from your back to your side while in a flat bed without using bedrails?: A Lot Help needed moving from lying on your back to sitting on the side of a flat bed without using bedrails?: A Lot Help needed moving to and from a bed to a chair (including a wheelchair)?: A Lot Help needed standing up from a chair using your arms (e.g., wheelchair or bedside chair)?: A Little Help needed to walk in hospital room?: A Lot Help needed climbing 3-5 steps with a railing? : A Lot 6 Click Score: 13    End of Session   Activity Tolerance: Patient tolerated treatment well;Patient limited by fatigue Patient left: in chair;with call bell/phone within reach;with chair alarm set Nurse Communication: Mobility status PT Visit Diagnosis: Unsteadiness on feet (R26.81);Other abnormalities of gait and mobility (R26.89);Muscle weakness (generalized) (M62.81)    Time: 8295-6213 PT Time Calculation (min) (ACUTE ONLY): 27  min   Charges:   PT Evaluation $PT Eval Moderate Complexity: 1 Mod PT Treatments $Therapeutic Activity: 23-37 mins PT General Charges $$ ACUTE PT VISIT: 1 Visit         3:56 PM, 06/04/23 Walton Guppy, MPT Physical Therapist with [facility] [phone] office [phone] mobile phone
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
## 2023-06-04 NOTE — Progress Notes (Addendum)
 Patient's temperature is 36.0 per temp. foley. Patient is asymptomatic and has no complaints. Dr. Winferd Hatter aware and advises Blankettrol (cooling/warming blanket) be on standby at this time due to patient's vitals being stable.

## 2023-06-05 ENCOUNTER — Inpatient Hospital Stay (HOSPITAL_COMMUNITY)

## 2023-06-05 ENCOUNTER — Other Ambulatory Visit (HOSPITAL_COMMUNITY): Payer: Self-pay

## 2023-06-05 ENCOUNTER — Telehealth (HOSPITAL_COMMUNITY): Payer: Self-pay

## 2023-06-05 DIAGNOSIS — D696 Thrombocytopenia, unspecified: Secondary | ICD-10-CM | POA: Diagnosis not present

## 2023-06-05 DIAGNOSIS — N1832 Chronic kidney disease, stage 3b: Secondary | ICD-10-CM | POA: Diagnosis not present

## 2023-06-05 DIAGNOSIS — I509 Heart failure, unspecified: Secondary | ICD-10-CM

## 2023-06-05 DIAGNOSIS — I4891 Unspecified atrial fibrillation: Secondary | ICD-10-CM | POA: Diagnosis not present

## 2023-06-05 DIAGNOSIS — G9341 Metabolic encephalopathy: Secondary | ICD-10-CM | POA: Diagnosis not present

## 2023-06-05 DIAGNOSIS — A0472 Enterocolitis due to Clostridium difficile, not specified as recurrent: Secondary | ICD-10-CM | POA: Insufficient documentation

## 2023-06-05 DIAGNOSIS — F0390 Unspecified dementia without behavioral disturbance: Secondary | ICD-10-CM

## 2023-06-05 LAB — GLUCOSE, CAPILLARY
Glucose-Capillary: 63 mg/dL — ABNORMAL LOW (ref 70–99)
Glucose-Capillary: 68 mg/dL — ABNORMAL LOW (ref 70–99)
Glucose-Capillary: 72 mg/dL (ref 70–99)
Glucose-Capillary: 74 mg/dL (ref 70–99)
Glucose-Capillary: 75 mg/dL (ref 70–99)
Glucose-Capillary: 83 mg/dL (ref 70–99)
Glucose-Capillary: 88 mg/dL (ref 70–99)
Glucose-Capillary: 95 mg/dL (ref 70–99)

## 2023-06-05 LAB — CBC
HCT: 21.9 % — ABNORMAL LOW (ref 39.0–52.0)
Hemoglobin: 7.6 g/dL — ABNORMAL LOW (ref 13.0–17.0)
MCH: 35.3 pg — ABNORMAL HIGH (ref 26.0–34.0)
MCHC: 34.7 g/dL (ref 30.0–36.0)
MCV: 101.9 fL — ABNORMAL HIGH (ref 80.0–100.0)
Platelets: 54 10*3/uL — ABNORMAL LOW (ref 150–400)
RBC: 2.15 MIL/uL — ABNORMAL LOW (ref 4.22–5.81)
RDW: 21.5 % — ABNORMAL HIGH (ref 11.5–15.5)
WBC: 5.1 10*3/uL (ref 4.0–10.5)
nRBC: 0.6 % — ABNORMAL HIGH (ref 0.0–0.2)

## 2023-06-05 LAB — BLOOD GAS, VENOUS
Acid-base deficit: 0.2 mmol/L (ref 0.0–2.0)
Bicarbonate: 23.6 mmol/L (ref 20.0–28.0)
Drawn by: 8368
O2 Saturation: 96.7 %
Patient temperature: 35.9
pCO2, Ven: 32 mmHg — ABNORMAL LOW (ref 44–60)
pH, Ven: 7.47 — ABNORMAL HIGH (ref 7.25–7.43)
pO2, Ven: 69 mmHg — ABNORMAL HIGH (ref 32–45)

## 2023-06-05 LAB — AMMONIA: Ammonia: 14 umol/L (ref 9–35)

## 2023-06-05 LAB — C DIFFICILE QUICK SCREEN W PCR REFLEX
C Diff antigen: POSITIVE — AB
C Diff interpretation: DETECTED
C Diff toxin: POSITIVE — AB

## 2023-06-05 LAB — ACTH STIMULATION, 3 TIME POINTS
Cortisol, 30 Min: 18.6 ug/dL
Cortisol, 60 Min: 22.7 ug/dL
Cortisol, Base: 6.1 ug/dL

## 2023-06-05 LAB — BASIC METABOLIC PANEL WITH GFR
Anion gap: 7 (ref 5–15)
BUN: 24 mg/dL — ABNORMAL HIGH (ref 8–23)
CO2: 20 mmol/L — ABNORMAL LOW (ref 22–32)
Calcium: 8.5 mg/dL — ABNORMAL LOW (ref 8.9–10.3)
Chloride: 116 mmol/L — ABNORMAL HIGH (ref 98–111)
Creatinine, Ser: 1.43 mg/dL — ABNORMAL HIGH (ref 0.61–1.24)
GFR, Estimated: 46 mL/min — ABNORMAL LOW (ref >60)
Glucose, Bld: 72 mg/dL (ref 70–99)
Potassium: 3.5 mmol/L (ref 3.5–5.1)
Sodium: 143 mmol/L (ref 135–145)

## 2023-06-05 LAB — MAGNESIUM: Magnesium: 1.8 mg/dL (ref 1.7–2.4)

## 2023-06-05 MED ORDER — AMLODIPINE BESYLATE 5 MG PO TABS
2.5000 mg | ORAL_TABLET | Freq: Every day | ORAL | Status: DC
  Administered 2023-06-05: 2.5 mg via ORAL
  Filled 2023-06-05: qty 1

## 2023-06-05 MED ORDER — VANCOMYCIN HCL 125 MG PO CAPS
125.0000 mg | ORAL_CAPSULE | Freq: Four times a day (QID) | ORAL | Status: DC
  Administered 2023-06-05: 125 mg via ORAL
  Filled 2023-06-05 (×6): qty 1

## 2023-06-05 MED ORDER — AMLODIPINE BESYLATE 5 MG PO TABS
2.5000 mg | ORAL_TABLET | Freq: Once | ORAL | Status: AC
  Administered 2023-06-05: 2.5 mg via ORAL
  Filled 2023-06-05: qty 1

## 2023-06-05 MED ORDER — VANCOMYCIN 50 MG/ML ORAL SOLUTION
125.0000 mg | Freq: Four times a day (QID) | ORAL | Status: DC
  Administered 2023-06-05: 125 mg
  Filled 2023-06-05 (×5): qty 2.5

## 2023-06-05 MED ORDER — DEXTROSE 50 % IV SOLN
12.5000 g | INTRAVENOUS | Status: AC
  Administered 2023-06-05: 12.5 g via INTRAVENOUS
  Filled 2023-06-05: qty 50

## 2023-06-05 MED ORDER — VANCOMYCIN 50 MG/ML ORAL SOLUTION
125.0000 mg | Freq: Four times a day (QID) | ORAL | Status: DC
  Administered 2023-06-05 – 2023-06-08 (×11): 125 mg via ORAL
  Filled 2023-06-05 (×16): qty 2.5

## 2023-06-05 MED ORDER — AMLODIPINE BESYLATE 5 MG PO TABS
5.0000 mg | ORAL_TABLET | Freq: Every day | ORAL | Status: DC
  Administered 2023-06-06 – 2023-06-08 (×3): 5 mg via ORAL
  Filled 2023-06-05 (×3): qty 1

## 2023-06-05 MED ORDER — HYDRALAZINE HCL 20 MG/ML IJ SOLN
10.0000 mg | Freq: Four times a day (QID) | INTRAMUSCULAR | Status: DC | PRN
  Administered 2023-06-06: 10 mg via INTRAVENOUS
  Filled 2023-06-05: qty 1

## 2023-06-05 NOTE — Telephone Encounter (Signed)
 Pharmacy Patient Advocate Encounter  Insurance verification completed.    The patient is insured through [facility]. Patient has Medicare and is not eligible for a copay card, but may be able to apply for patient assistance or Medicare RX Payment Plan (Patient Must reach out to their plan, if eligible for payment plan), if available.    Ran test claim for Vancomycin  and the current 30 day co-pay is $1.60.  Ran test claim for Dificid and the current 30 day co-pay is $4.80.  This test claim was processed through [facility]- copay amounts may vary at other pharmacies due to pharmacy/plan contracts, or as the patient moves through the different stages of their insurance plan.

## 2023-06-05 NOTE — Progress Notes (Addendum)
 2210 RN reached out to triad on call, Adefeso, DO, regarding pt HR sustaining in the high 40s (48).   2227 Adefeso responded. No new orders at this time.   2320 167/70 (104) RN reached out to Adefeso about this BP.   2350 Adefeso placed order for PRN Hydralazine  10 mg IV Q6 for SBP > 170   0300 pt temp via bladder probe was 95.7 degrees farenheit. Warming blanket therapy started with rectal temperature probe inserted. Adefeso made aware.

## 2023-06-05 NOTE — Plan of Care (Signed)
   Problem: Education: Goal: Knowledge of General Education information will improve Description: Including pain rating scale, medication(s)/side effects and non-pharmacologic comfort measures Outcome: Not Progressing
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
## 2023-06-05 NOTE — Care Management Important Message (Signed)
 Important Message  Patient Details  Name: Terrance Miller MRN: [id number] Date of Birth: 10-02-1928   Important Message Given:  Yes - Medicare IM (reviewed letter with daughter [name] at [phone])     [name] 06/05/2023, 2:37 PM

## 2023-06-05 NOTE — Plan of Care (Signed)
  Problem: Acute Rehab OT Goals (only OT should resolve) Goal: Pt. Will Perform Eating Flowsheets (Taken 06/05/2023 0948) Pt Will Perform Eating: with supervision Goal: Pt. Will Perform Grooming Flowsheets (Taken 06/05/2023 0948) Pt Will Perform Grooming: with set-up Goal: Pt. Will Perform Upper Body Bathing Flowsheets (Taken 06/05/2023 0948) Pt Will Perform Upper Body Bathing:  with min assist  sitting Goal: Pt. Will Perform Upper Body Dressing Flowsheets (Taken 06/05/2023 0948) Pt Will Perform Upper Body Dressing:  with min assist  sitting Goal: Pt. Will Perform Lower Body Dressing Flowsheets (Taken 06/05/2023 0948) Pt Will Perform Lower Body Dressing:  with mod assist  sitting/lateral leans Goal: Pt. Will Transfer To Toilet Flowsheets (Taken 06/05/2023 [phone]) Pt Will Transfer to Toilet:  with contact guard assist  with min assist  stand pivot transfer Goal: Pt/Caregiver Will Perform Home Exercise Program Flowsheets (Taken 06/05/2023 [phone]) Pt/caregiver will Perform Home Exercise Program:  Increased ROM  Increased strength  Both right and left upper extremity  With minimal assist  Blue Ruggerio OT, MOT

## 2023-06-05 NOTE — Progress Notes (Signed)
 Physical Therapy Treatment Patient Details Name: Terrance Miller MRN: [id number] DOB: May 18, 1928 Today's Date: 06/05/2023   History of Present Illness Terrance Miller is a 89 y.o. male with medical history significant of hypertension, hyperlipidemia, CAD s/p PCI, dementia who presents to the emergency department due to concern for sepsis.   Patient was admitted in 05/22 to 5/26 due to proctitis and hypothermia.  He was treated with IV Zosyn  and then transition to Augmentin  on discharge.  Apparently, patient has been doing well since discharge to SNF per ED Medical record.  He had a follow-up appointment with urology today and was noted to have a BP of 88/52 with HR of 48, so he was sent to the emergency department due to concern for sepsis.     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  Unfortunately, his overall health status    PT Comments  Patient demonstrates slow labored movement for sitting up at bedside with [facility] raised, very unsteady on feet with scissoring of legs, buckling of knees and limited to a few steps at bedside before having to sit due to fall risk. Patient tolerated sitting up in chair after therapy. Patient will benefit from continued skilled physical therapy in hospital and recommended venue below to increase strength, balance, endurance for safe ADLs and gait.       If plan is discharge home, recommend the following: A lot of help with bathing/dressing/bathroom;A lot of help with walking and/or transfers;Help with stairs or ramp for entrance;Assistance with cooking/housework   Can travel by private vehicle     No  Equipment Recommendations  None recommended by PT    Recommendations for Other Services       Precautions / Restrictions Precautions Precautions: Fall Recall of Precautions/Restrictions: Impaired Restrictions Weight Bearing Restrictions Per  Provider Order: No     Mobility  Bed Mobility Overal bed mobility: Needs Assistance Bed Mobility: Supine to Sit     Supine to sit: Max assist, Total assist, HOB elevated     General bed mobility comments: increased time, labored movement    Transfers Overall transfer level: Needs assistance Equipment used: Rolling walker (2 wheels) Transfers: Sit to/from Stand, Bed to chair/wheelchair/BSC Sit to Stand: Mod assist   Step pivot transfers: Mod assist, Max assist       General transfer comment: unsteady labored movement    Ambulation/Gait Ambulation/Gait assistance: Mod assist, Max assist Gait Distance (Feet): 4 Feet Assistive device: Rolling walker (2 wheels) Gait Pattern/deviations: Decreased step length - right, Decreased step length - left, Decreased stride length, Knees buckling, Scissoring Gait velocity: slow     General Gait Details: limited to a few slow labored side steps with difficulty advancing BLE due to weakness   Stairs             Wheelchair Mobility     Tilt Bed    Modified Rankin (Stroke Patients Only)       Balance Overall balance assessment: Needs assistance Sitting-balance support: Feet supported, No upper extremity supported Sitting balance-Leahy Scale: Fair Sitting balance - Comments: seated at EOB   Standing balance support: Reliant on assistive device for balance, During functional activity, Bilateral upper extremity supported Standing balance-Leahy Scale: Poor Standing balance comment: using RW  Communication Communication Factors Affecting Communication: Difficulty expressing self  Cognition Arousal: Alert Behavior During Therapy: Flat affect   PT - Cognitive impairments: History of cognitive impairments                         Following commands: Impaired Following commands impaired: Follows one step commands with increased time    Cueing Cueing Techniques: Verbal  cues, Tactile cues  Exercises      General Comments        Pertinent Vitals/Pain Pain Assessment Pain Assessment: Faces Faces Pain Scale: Hurts little more Pain Location: B LE with movement Pain Descriptors / Indicators: Grimacing, Discomfort Pain Intervention(s): Limited activity within patient's tolerance, Monitored during session, Repositioned    Home Living                          Prior Function            PT Goals (current goals can now be found in the care plan section) Acute Rehab PT Goals Patient Stated Goal: return home with family to assist PT Goal Formulation: With patient Time For Goal Achievement: 06/18/23 Potential to Achieve Goals: Good Progress towards PT goals: Progressing toward goals    Frequency    Min 3X/week      PT Plan      Co-evaluation PT/OT/SLP Co-Evaluation/Treatment: Yes Reason for Co-Treatment: To address functional/ADL transfers PT goals addressed during session: Mobility/safety with mobility;Balance;Proper use of DME        AM-PAC PT "6 Clicks" Mobility   Outcome Measure  Help needed turning from your back to your side while in a flat bed without using bedrails?: A Lot Help needed moving from lying on your back to sitting on the side of a flat bed without using bedrails?: A Lot Help needed moving to and from a bed to a chair (including a wheelchair)?: A Lot Help needed standing up from a chair using your arms (e.g., wheelchair or bedside chair)?: A Lot Help needed to walk in hospital room?: A Lot Help needed climbing 3-5 steps with a railing? : Total 6 Click Score: 11    End of Session   Activity Tolerance: Patient tolerated treatment well;Patient limited by fatigue Patient left: in chair;with call bell/phone within reach;with chair alarm set Nurse Communication: Mobility status PT Visit Diagnosis: Unsteadiness on feet (R26.81);Other abnormalities of gait and mobility (R26.89);Muscle weakness (generalized)  (M62.81)     Time: 1324-4010 PT Time Calculation (min) (ACUTE ONLY): 13 min  Charges:    $Therapeutic Activity: 8-22 mins PT General Charges $$ ACUTE PT VISIT: 1 Visit                     2:19 PM, 06/05/23 Walton Guppy, MPT Physical Therapist with [facility] [phone] office [phone] mobile phone

## 2023-06-05 NOTE — Progress Notes (Addendum)
 Rounding Note   Patient Name: Terrance Miller Date of Encounter: 06/05/2023  [facility] Cardiologist: Lasalle Pointer, MD   Subjective Pt comfortable in chair  Denies CP  Scheduled Meds:  aspirin  EC  81 mg Oral Q breakfast   Chlorhexidine  Gluconate Cloth  6 each Topical Q0600   cyanocobalamin   1,000 mcg Oral Daily   folic acid   1 mg Intravenous Daily   melatonin  3 mg Oral QHS   memantine   5 mg Oral Daily   mupirocin  ointment  1 Application Nasal BID   rosuvastatin   5 mg Oral Daily   sertraline  25 mg Oral Daily   tamsulosin   0.4 mg Oral Daily   vancomycin   125 mg Per Tube QID   Continuous Infusions:  ceFEPime  (MAXIPIME ) IV 2 g (06/05/23 1021)   PRN Meds: acetaminophen  **OR** acetaminophen , ondansetron  **OR** ondansetron  (ZOFRAN ) IV   Vital Signs  Vitals:   06/05/23 0900 06/05/23 1000 06/05/23 1100 06/05/23 1200  BP: (!) 141/69 (!) 170/70 (!) 163/81 (!) 162/82  Pulse: 61 (!) 58 (!) 58 63  Resp: 16 17 16 16   Temp: 98.1 F (36.7 C) 97.9 F (36.6 C) (!) 97.5 F (36.4 C) (!) 97 F (36.1 C)  TempSrc:      SpO2: 100% 98% 99% 100%  Weight:      Height:        Intake/Output Summary (Last 24 hours) at 06/05/2023 1332 Last data filed at 06/05/2023 0434 Gross per 24 hour  Intake 1715.75 ml  Output 600 ml  Net 1115.75 ml      06/04/2023    6:30 AM 06/03/2023    9:52 PM 06/03/2023    2:50 PM  Last 3 Weights  Weight (lbs) 173 lb 15.1 oz 175 lb 11.3 oz 171 lb  Weight (kg) 78.9 kg 79.7 kg 77.565 kg      Telemetry Afib and SR    - Personally Reviewed  ECG  No new  - Personally Reviewed  Physical Exam  GEN: No acute distress.   Neck: No JVD Cardiac: RRR, no murmur Respiratory: Clear to auscultation bilaterally. GI: Soft, nontender, non-distended  MS: Tr LE  edema Labs High Sensitivity Troponin:   Recent Labs  Lab 05/28/23 2204 05/28/23 2318  TROPONINIHS 11 10     Chemistry Recent Labs  Lab 06/03/23 1510 06/04/23 0446 06/05/23 0511  NA  142 146* 143  K 3.8 3.4* 3.5  CL 110 115* 116*  CO2 23 22 20*  GLUCOSE 153* 70 72  BUN 32* 30* 24*  CREATININE 1.54* 1.34* 1.43*  CALCIUM  9.5 8.9 8.5*  MG  --  1.8 1.8  PROT 7.1 6.0*  --   ALBUMIN 3.0* 2.5*  --   AST 36 28  --   ALT 30 22  --   ALKPHOS 69 59  --   BILITOT 0.8 0.9  --   GFRNONAA 42* 50* 46*  ANIONGAP 9 9 7     Lipids No results for input(s): "CHOL", "TRIG", "HDL", "LABVLDL", "LDLCALC", "CHOLHDL" in the last 168 hours.  Hematology Recent Labs  Lab 06/03/23 1510 06/04/23 0446 06/05/23 0511  WBC 5.0 5.3 5.1  RBC 2.71* 2.20* 2.15*  HGB 9.4* 7.3* 7.6*  HCT 28.1* 22.2* 21.9*  MCV 103.7* 100.9* 101.9*  MCH 34.7* 33.2 35.3*  MCHC 33.5 32.9 34.7  RDW 21.8* 21.4* 21.5*  PLT 55* 54* 54*   Thyroid  Recent Labs  Lab 06/03/23 1532  TSH 4.137  FREET4 0.91  BNPNo results for input(s): "BNP", "PROBNP" in the last 168 hours.  DDimer No results for input(s): "DDIMER" in the last 168 hours.   Radiology  CT ABDOMEN PELVIS W CONTRAST Result Date: 06/04/2023 CLINICAL DATA:  Abdominal pain.  Sepsis. EXAM: CT ABDOMEN AND PELVIS WITH CONTRAST TECHNIQUE: Multidetector CT imaging of the abdomen and pelvis was performed using the standard protocol following bolus administration of intravenous contrast. RADIATION DOSE REDUCTION: This exam was performed according to the departmental dose-optimization program which includes automated exposure control, adjustment of the mA and/or kV according to patient size and/or use of iterative reconstruction technique. CONTRAST:  OMNIPAQUE  IOHEXOL  300 MG/ML  SOLN COMPARISON:  CT abdomen pelvis dated 05/29/2023. FINDINGS: Lower chest: Bibasilar subpleural atelectasis. Three vessel coronary vascular calcification. No intra-abdominal free air or free fluid. Hepatobiliary: The liver is unremarkable. No biliary dilatation. The gallbladder is contracted. Tiny stones suspected in the gallbladder neck. There is diffuse thickened appearance of the  gallbladder wall with infiltration of the surrounding fat. Correlation with clinical exam and further evaluation with ultrasound recommended. Pancreas: Unremarkable. No pancreatic ductal dilatation or surrounding inflammatory changes. Spleen: Normal in size without focal abnormality. Adrenals/Urinary Tract: The adrenal glands unremarkable mild bilateral renal parenchyma atrophy. Small bilateral renal cysts. There is no hydronephrosis on either side. There is symmetric enhancement and excretion of contrast by both kidneys. The visualized ureters appear unremarkable. The urinary bladder is decompressed around a Foley catheter. Mild haziness of the bladder wall and pericholecystic fat. Correlation with urinalysis recommended to exclude cystitis. Tiny amount of air within the bladder likely introduced via catheter. Stomach/Bowel: Improved thickened appearance of the rectal wall and perirectal stranding compared to prior CT. There is no bowel obstruction. The appendix is normal. Vascular/Lymphatic: Mild aortoiliac atherosclerotic disease. The IVC is unremarkable. No portal venous gas. There is no adenopathy. Reproductive: The prostate and seminal vesicles are grossly unremarkable. Other: Small fat containing right inguinal hernia. There is a small left inguinal hernia predominantly containing fat. There is focal protrusion of small bowel into the left inguinal canal. Musculoskeletal: Osteopenia with degenerative changes of the spine. No acute osseous pathology. IMPRESSION: 1. Cholelithiasis with thickened appearance of the gallbladder wall and infiltration of the surrounding fat. Further evaluation with ultrasound recommended. 2. Improved thickened appearance of the rectal wall and perirectal stranding compared to prior CT. No bowel obstruction. Normal appendix. 3. Small left inguinal hernia predominantly containing fat. There is focal protrusion of small bowel into the left inguinal canal. No obstruction. 4.  Aortic  Atherosclerosis (ICD10-I70.0). Electronically Signed   By: Angus Bark M.D.   On: 06/04/2023 16:34   DG Chest Port 1 View Result Date: 06/03/2023 CLINICAL DATA:  Possible sepsis EXAM: PORTABLE CHEST 1 VIEW COMPARISON:  CT 05/29/2023, chest x-ray 05/28/2023, 01/04/2023, 07/29/2022 FINDINGS: Mild cardiomegaly with aortic atherosclerosis. Chronic appearing interstitial opacities at left base, likely corresponds to scarring on CT. No definite acute airspace disease, pleural effusion or pneumothorax. IMPRESSION: No active disease. Mild cardiomegaly. Electronically Signed   By: Esmeralda Hedge M.D.   On: 06/03/2023 16:32    Cardiac Studies   Patient Profile    Terrance Miller is a 89 y.o. male with a hx of CAD (s/p STEMI in 2004 with DES to RCA), HTN, HLD and pulmonary HTN who is being seen 06/04/2023 for the evaluation of atrial fibrillation and bradycardia at the request of Dr. Elyse Hand.  Assessment & Plan   1  Atrial fibrillation   Rates controlled   He in SR  and afib    Does not sense With hx anemia and thrombocytopenia and MGUS I do not think he is a good candidate for anticoagulation  2  CAD Remote intervention  No symptoms of angina    Continue ASA and Crestor    3  HFpEF   Volume status looks OK today   I would back off of IV fluids  4  Hx if HTN  BP  Carvedilol  on hold  BP high today     Would not resume carvedilol  give HR Could start low dose amlodipine   Follow   4  Dementia  Pt confused, answers some question  Given age, medical problems need to confirm goals of care  Will sign off    For questions or updates, please contact [facility] Please consult [email or web address] for contact info under     Signed, Ola Berger, MD  06/05/2023, 1:32 PM

## 2023-06-05 NOTE — Evaluation (Signed)
 Occupational Therapy Evaluation Patient Details Name: Terrance Miller MRN: [id number] DOB: [birth year] Today's Date: 06/05/2023   History of Present Illness   Terrance Miller is a 89 y.o. male with medical history significant of hypertension, hyperlipidemia, CAD s/p PCI, dementia who presents to the emergency department due to concern for sepsis.   Patient was admitted in 05/22 to 5/26 due to proctitis and hypothermia.  He was treated with IV Zosyn  and then transition to Augmentin  on discharge.  Apparently, patient has been doing well since discharge to SNF per ED Medical record.  He had a follow-up appointment with urology today and was noted to have a BP of 88/52 with HR of 48, so he was sent to the emergency department due to concern for sepsis.     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  Unfortunately, his overall health status     Clinical Impressions Pt agreeable to OT and PT co-evaluation. Pt required max A for bed mobility and mod to max A for step pivot transfer to chair with RW. Difficult to assess B UE but generally weak with likely shoulder A/ROM limitations. Max to total assist needed for lower body ADL's and more max for upper body per clinical judgement. Pt left in the chair with call bell within reach and chair alarm set. Pt will benefit from continued OT in the hospital and recommended venue below to increase strength, balance, and endurance for safe ADL's.        If plan is discharge home, recommend the following:   A lot of help with walking and/or transfers;A lot of help with bathing/dressing/bathroom;Assistance with cooking/housework;Assistance with feeding;Assist for transportation;Help with stairs or ramp for entrance;Direct supervision/assist for medications management     Functional Status Assessment   Patient has had a recent decline in their  functional status and demonstrates the ability to make significant improvements in function in a reasonable and predictable amount of time.     Equipment Recommendations   None recommended by OT             Precautions/Restrictions   Precautions Precautions: Fall Recall of Precautions/Restrictions: Impaired Restrictions Weight Bearing Restrictions Per Provider Order: No     Mobility Bed Mobility Overal bed mobility: Needs Assistance Bed Mobility: Supine to Sit     Supine to sit: Max assist, Total assist, HOB elevated     General bed mobility comments: much assist; signs of increased pain with B LE mobility to EOB    Transfers Overall transfer level: Needs assistance Equipment used: Rolling walker (2 wheels) Transfers: Sit to/from Stand, Bed to chair/wheelchair/BSC Sit to Stand: Mod assist     Step pivot transfers: Mod assist, Max assist     General transfer comment: unsteady labored movement      Balance Overall balance assessment: Needs assistance Sitting-balance support: Feet supported, No upper extremity supported Sitting balance-Leahy Scale: Fair Sitting balance - Comments: seated at EOB   Standing balance support: Reliant on assistive device for balance, During functional activity, Bilateral upper extremity supported Standing balance-Leahy Scale: Poor Standing balance comment: using RW                           ADL either performed or assessed with clinical judgement   ADL Overall ADL's : Needs assistance/impaired Eating/Feeding: Maximal assistance;Moderate assistance;Sitting   Grooming: Moderate assistance;Maximal  assistance;Sitting   Upper Body Bathing: Maximal assistance;Sitting   Lower Body Bathing: Maximal assistance;Total assistance;Bed level   Upper Body Dressing : Moderate assistance;Maximal assistance;Sitting   Lower Body Dressing: Maximal assistance;Total assistance;Sitting/lateral leans   Toilet Transfer: Rolling  walker (2 wheels);Moderate assistance;Stand-pivot;Maximal assistance Toilet Transfer Details (indicate cue type and reason): Simualted via EOB to chair transfer Toileting- Clothing Manipulation and Hygiene: Total assistance;Bed level               Vision Baseline Vision/History:  (poor historian) Vision Assessment?: No apparent visual deficits     Perception Perception: Not tested       Praxis Praxis: Not tested       Pertinent Vitals/Pain Pain Assessment Pain Assessment: Faces Faces Pain Scale: Hurts little more Pain Location: B LE with movement Pain Descriptors / Indicators: Grimacing, Discomfort Pain Intervention(s): Limited activity within patient's tolerance, Monitored during session, Repositioned     Extremity/Trunk Assessment Upper Extremity Assessment Upper Extremity Assessment: Difficult to assess due to impaired cognition (Able to grasp RW with assist to bring hands to handles; B shoulder A/ROM seems limited but difficult to fully assess.)   Lower Extremity Assessment Lower Extremity Assessment: Defer to PT evaluation   Cervical / Trunk Assessment Cervical / Trunk Assessment: Normal   Communication Communication Communication: Impaired Factors Affecting Communication: Difficulty expressing self   Cognition Arousal: Alert Behavior During Therapy: Flat affect Cognition: History of cognitive impairments             OT - Cognition Comments: Impaired at baseline; pt mostly grunting with not clear verbal communication today.                 Following commands: Impaired Following commands impaired: Follows one step commands with increased time     Cueing  General Comments   Cueing Techniques: Verbal cues;Tactile cues                 Home Living Family/patient expects to be discharged to:: Skilled nursing facility                                        Prior Functioning/Environment Prior Level of Function : Needs  assist  Cognitive Assist : Mobility (cognitive)     Physical Assist : Mobility (physical);ADLs (physical) Mobility (physical): Transfers;Gait;Bed mobility;Stairs ADLs (physical): IADLs;Dressing;Bathing Mobility Comments: household ambulator using RW. pt reports assist from daughter and son in law that pt given assist for sit/stand transfers and ambulates independently with RW. (per chart) ADLs Comments: Assisted with bathing and dressing per pt's reports. Chart review indicates assist for IADL's as well. Pt confirms. (per chart)    OT Problem List: Decreased strength;Decreased range of motion;Decreased activity tolerance;Impaired balance (sitting and/or standing);Decreased cognition;Decreased safety awareness;Decreased knowledge of use of DME or AE;Impaired UE functional use   OT Treatment/Interventions: Self-care/ADL training;Therapeutic exercise;DME and/or AE instruction;Therapeutic activities;Patient/family education;Balance training;Cognitive remediation/compensation;Energy conservation      OT Goals(Current goals can be found in the care plan section)   Acute Rehab OT Goals Patient Stated Goal: none stated OT Goal Formulation: Patient unable to participate in goal setting Time For Goal Achievement: 06/19/23 Potential to Achieve Goals:  (none stated)   OT Frequency:  Min 2X/week    Co-evaluation PT/OT/SLP Co-Evaluation/Treatment: Yes Reason for Co-Treatment: To address functional/ADL transfers   OT goals addressed during session: ADL's and self-care  End of Session Equipment Utilized During Treatment: Rolling walker (2 wheels)  Activity Tolerance: Patient tolerated treatment well Patient left: in chair;with call bell/phone within reach;with chair alarm set  OT Visit Diagnosis: Unsteadiness on feet (R26.81);Other abnormalities of gait and mobility (R26.89);Muscle weakness (generalized) (M62.81);Other symptoms and signs involving cognitive  function                Time: 4098-1191 OT Time Calculation (min): 11 min Charges:  OT General Charges $OT Visit: 1 Visit OT Evaluation $OT Eval Low Complexity: 1 Low  Draven Natter OT, MOT  Thurnell Floss 06/05/2023, 9:46 AM

## 2023-06-06 DIAGNOSIS — N1832 Chronic kidney disease, stage 3b: Secondary | ICD-10-CM | POA: Diagnosis not present

## 2023-06-06 DIAGNOSIS — I4891 Unspecified atrial fibrillation: Secondary | ICD-10-CM | POA: Diagnosis not present

## 2023-06-06 DIAGNOSIS — G9341 Metabolic encephalopathy: Secondary | ICD-10-CM | POA: Diagnosis not present

## 2023-06-06 DIAGNOSIS — D696 Thrombocytopenia, unspecified: Secondary | ICD-10-CM | POA: Diagnosis not present

## 2023-06-06 LAB — CBC
HCT: 22.4 % — ABNORMAL LOW (ref 39.0–52.0)
Hemoglobin: 7.6 g/dL — ABNORMAL LOW (ref 13.0–17.0)
MCH: 34.4 pg — ABNORMAL HIGH (ref 26.0–34.0)
MCHC: 33.9 g/dL (ref 30.0–36.0)
MCV: 101.4 fL — ABNORMAL HIGH (ref 80.0–100.0)
Platelets: 59 10*3/uL — ABNORMAL LOW (ref 150–400)
RBC: 2.21 MIL/uL — ABNORMAL LOW (ref 4.22–5.81)
RDW: 21.8 % — ABNORMAL HIGH (ref 11.5–15.5)
WBC: 8 10*3/uL (ref 4.0–10.5)
nRBC: 0.5 % — ABNORMAL HIGH (ref 0.0–0.2)

## 2023-06-06 LAB — COMPREHENSIVE METABOLIC PANEL WITH GFR
ALT: 21 U/L (ref 0–44)
AST: 27 U/L (ref 15–41)
Albumin: 2.7 g/dL — ABNORMAL LOW (ref 3.5–5.0)
Alkaline Phosphatase: 59 U/L (ref 38–126)
Anion gap: 7 (ref 5–15)
BUN: 21 mg/dL (ref 8–23)
CO2: 19 mmol/L — ABNORMAL LOW (ref 22–32)
Calcium: 8.7 mg/dL — ABNORMAL LOW (ref 8.9–10.3)
Chloride: 117 mmol/L — ABNORMAL HIGH (ref 98–111)
Creatinine, Ser: 1.43 mg/dL — ABNORMAL HIGH (ref 0.61–1.24)
GFR, Estimated: 46 mL/min — ABNORMAL LOW (ref >60)
Glucose, Bld: 73 mg/dL (ref 70–99)
Potassium: 3.6 mmol/L (ref 3.5–5.1)
Sodium: 143 mmol/L (ref 135–145)
Total Bilirubin: 1.4 mg/dL — ABNORMAL HIGH (ref 0.0–1.2)
Total Protein: 6 g/dL — ABNORMAL LOW (ref 6.5–8.1)

## 2023-06-06 LAB — GLUCOSE, CAPILLARY
Glucose-Capillary: 74 mg/dL (ref 70–99)
Glucose-Capillary: 76 mg/dL (ref 70–99)
Glucose-Capillary: 75 mg/dL (ref 70–99)
Glucose-Capillary: 83 mg/dL (ref 70–99)

## 2023-06-06 LAB — MAGNESIUM: Magnesium: 1.8 mg/dL (ref 1.7–2.4)

## 2023-06-06 LAB — PHOSPHORUS: Phosphorus: 2.4 mg/dL — ABNORMAL LOW (ref 2.5–4.6)

## 2023-06-06 MED ORDER — ENSURE PLUS HIGH PROTEIN PO LIQD
237.0000 mL | Freq: Two times a day (BID) | ORAL | Status: DC
  Administered 2023-06-06 – 2023-06-08 (×3): 237 mL via ORAL

## 2023-06-06 MED ORDER — K PHOS MONO-SOD PHOS DI & MONO 155-852-130 MG PO TABS
500.0000 mg | ORAL_TABLET | Freq: Two times a day (BID) | ORAL | Status: DC
  Administered 2023-06-06 – 2023-06-08 (×4): 500 mg via ORAL
  Filled 2023-06-06 (×4): qty 2

## 2023-06-06 NOTE — Plan of Care (Signed)
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
## 2023-06-07 DIAGNOSIS — D696 Thrombocytopenia, unspecified: Secondary | ICD-10-CM | POA: Diagnosis not present

## 2023-06-07 DIAGNOSIS — I4891 Unspecified atrial fibrillation: Secondary | ICD-10-CM | POA: Diagnosis not present

## 2023-06-07 DIAGNOSIS — I959 Hypotension, unspecified: Secondary | ICD-10-CM | POA: Diagnosis not present

## 2023-06-07 DIAGNOSIS — G9341 Metabolic encephalopathy: Secondary | ICD-10-CM | POA: Diagnosis not present

## 2023-06-07 LAB — GLUCOSE, CAPILLARY
Glucose-Capillary: 72 mg/dL (ref 70–99)
Glucose-Capillary: 64 mg/dL — ABNORMAL LOW (ref 70–99)
Glucose-Capillary: 75 mg/dL (ref 70–99)
Glucose-Capillary: 101 mg/dL — ABNORMAL HIGH (ref 70–99)
Glucose-Capillary: 96 mg/dL (ref 70–99)

## 2023-06-07 LAB — BASIC METABOLIC PANEL WITH GFR
Anion gap: 7 (ref 5–15)
BUN: 21 mg/dL (ref 8–23)
CO2: 20 mmol/L — ABNORMAL LOW (ref 22–32)
Calcium: 8.8 mg/dL — ABNORMAL LOW (ref 8.9–10.3)
Chloride: 117 mmol/L — ABNORMAL HIGH (ref 98–111)
Creatinine, Ser: 1.43 mg/dL — ABNORMAL HIGH (ref 0.61–1.24)
GFR, Estimated: 46 mL/min — ABNORMAL LOW (ref >60)
Glucose, Bld: 73 mg/dL (ref 70–99)
Potassium: 3.5 mmol/L (ref 3.5–5.1)
Sodium: 144 mmol/L (ref 135–145)

## 2023-06-07 LAB — MAGNESIUM: Magnesium: 1.9 mg/dL (ref 1.7–2.4)

## 2023-06-07 LAB — PHOSPHORUS: Phosphorus: 2.7 mg/dL (ref 2.5–4.6)

## 2023-06-07 MED ORDER — POTASSIUM CHLORIDE IN NACL 20-0.45 MEQ/L-% IV SOLN: INTRAVENOUS | Status: DC
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
## 2023-06-08 DIAGNOSIS — N1832 Chronic kidney disease, stage 3b: Secondary | ICD-10-CM | POA: Diagnosis not present

## 2023-06-08 DIAGNOSIS — D696 Thrombocytopenia, unspecified: Secondary | ICD-10-CM | POA: Diagnosis not present

## 2023-06-08 DIAGNOSIS — A0472 Enterocolitis due to Clostridium difficile, not specified as recurrent: Secondary | ICD-10-CM | POA: Diagnosis not present

## 2023-06-08 DIAGNOSIS — I4891 Unspecified atrial fibrillation: Secondary | ICD-10-CM | POA: Diagnosis not present

## 2023-06-08 LAB — PHOSPHORUS: Phosphorus: 3.3 mg/dL (ref 2.5–4.6)

## 2023-06-08 LAB — CULTURE, BLOOD (ROUTINE X 2)
Culture: NO GROWTH
Culture: NO GROWTH
Special Requests: ADEQUATE

## 2023-06-08 LAB — BASIC METABOLIC PANEL WITH GFR
Anion gap: 8 (ref 5–15)
BUN: 20 mg/dL (ref 8–23)
CO2: 19 mmol/L — ABNORMAL LOW (ref 22–32)
Calcium: 8.5 mg/dL — ABNORMAL LOW (ref 8.9–10.3)
Chloride: 116 mmol/L — ABNORMAL HIGH (ref 98–111)
Creatinine, Ser: 1.52 mg/dL — ABNORMAL HIGH (ref 0.61–1.24)
GFR, Estimated: 43 mL/min — ABNORMAL LOW (ref >60)
Glucose, Bld: 84 mg/dL (ref 70–99)
Potassium: 3.5 mmol/L (ref 3.5–5.1)
Sodium: 143 mmol/L (ref 135–145)

## 2023-06-08 LAB — MAGNESIUM: Magnesium: 1.9 mg/dL (ref 1.7–2.4)

## 2023-06-08 LAB — CBC
HCT: 21.7 % — ABNORMAL LOW (ref 39.0–52.0)
Hemoglobin: 7.4 g/dL — ABNORMAL LOW (ref 13.0–17.0)
MCH: 34.7 pg — ABNORMAL HIGH (ref 26.0–34.0)
MCHC: 34.1 g/dL (ref 30.0–36.0)
MCV: 101.9 fL — ABNORMAL HIGH (ref 80.0–100.0)
Platelets: 88 10*3/uL — ABNORMAL LOW (ref 150–400)
RBC: 2.13 MIL/uL — ABNORMAL LOW (ref 4.22–5.81)
RDW: 21.3 % — ABNORMAL HIGH (ref 11.5–15.5)
WBC: 5.6 10*3/uL (ref 4.0–10.5)
nRBC: 0.9 % — ABNORMAL HIGH (ref 0.0–0.2)

## 2023-06-08 LAB — GLUCOSE, CAPILLARY
Glucose-Capillary: 84 mg/dL (ref 70–99)
Glucose-Capillary: 91 mg/dL (ref 70–99)

## 2023-06-08 MED ORDER — ENSURE PLUS HIGH PROTEIN PO LIQD: 237.0000 mL | Freq: Two times a day (BID) | ORAL | Status: DC

## 2023-06-08 MED ORDER — POTASSIUM CHLORIDE 20 MEQ PO PACK
20.0000 meq | PACK | Freq: Once | ORAL | Status: AC
  Administered 2023-06-08: 20 meq via ORAL
  Filled 2023-06-08: qty 1

## 2023-06-08 MED ORDER — VANCOMYCIN 50 MG/ML ORAL SOLUTION: 125.0000 mg | Freq: Four times a day (QID) | ORAL | Status: DC

## 2023-06-08 NOTE — Care Management Important Message (Signed)
 Important Message  Patient Details  Name: Terrance Miller MRN: [id number] Date of Birth: Dec 07, 1928   Important Message Given:  Yes - Medicare IM     [name] 06/08/2023, 11:33 AM
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
## 2023-06-08 NOTE — Plan of Care (Signed)
  Problem: Clinical Measurements: Goal: Ability to maintain clinical measurements within normal limits will improve Outcome: Progressing   Problem: Activity: Goal: Risk for activity intolerance will decrease Outcome: Progressing   Problem: Coping: Goal: Level of anxiety will decrease Outcome: Progressing   Problem: Elimination: Goal: Will not experience complications related to urinary retention Outcome: Progressing   Problem: Safety: Goal: Ability to remain free from injury will improve Outcome: Progressing   Problem: Skin Integrity: Goal: Risk for impaired skin integrity will decrease Outcome: Progressing   Problem: Education: Goal: Knowledge of disease or condition will improve Outcome: Progressing   Problem: Activity: Goal: Ability to tolerate increased activity will improve Outcome: Progressing   Problem: Cardiac: Goal: Ability to achieve and maintain adequate cardiopulmonary perfusion will improve Outcome: Progressing   Problem: Health Behavior/Discharge Planning: Goal: Ability to safely manage health-related needs after discharge will improve Outcome: Progressing

## 2023-06-09 DIAGNOSIS — E1122 Type 2 diabetes mellitus with diabetic chronic kidney disease: Secondary | ICD-10-CM | POA: Diagnosis not present

## 2023-06-09 DIAGNOSIS — I5033 Acute on chronic diastolic (congestive) heart failure: Secondary | ICD-10-CM | POA: Diagnosis not present

## 2023-06-10 DIAGNOSIS — I700 Atherosclerosis of aorta: Secondary | ICD-10-CM | POA: Diagnosis not present

## 2023-06-10 DIAGNOSIS — I5033 Acute on chronic diastolic (congestive) heart failure: Secondary | ICD-10-CM | POA: Diagnosis not present

## 2023-06-11 DIAGNOSIS — F5102 Adjustment insomnia: Secondary | ICD-10-CM | POA: Diagnosis not present

## 2023-06-12 DIAGNOSIS — I5033 Acute on chronic diastolic (congestive) heart failure: Secondary | ICD-10-CM | POA: Diagnosis not present

## 2023-06-12 DIAGNOSIS — I5022 Chronic systolic (congestive) heart failure: Secondary | ICD-10-CM | POA: Diagnosis not present

## 2023-06-12 DIAGNOSIS — N1832 Chronic kidney disease, stage 3b: Secondary | ICD-10-CM | POA: Diagnosis not present

## 2023-06-15 ENCOUNTER — Encounter (INDEPENDENT_AMBULATORY_CARE_PROVIDER_SITE_OTHER): Payer: Self-pay | Admitting: Gastroenterology

## 2023-06-15 ENCOUNTER — Ambulatory Visit (INDEPENDENT_AMBULATORY_CARE_PROVIDER_SITE_OTHER): Admitting: Gastroenterology

## 2023-06-15 VITALS — BP 135/70 | HR 44 | Temp 97.1°F | Ht 72.0 in | Wt 173.6 lb

## 2023-06-15 DIAGNOSIS — K5289 Other specified noninfective gastroenteritis and colitis: Secondary | ICD-10-CM | POA: Diagnosis not present

## 2023-06-15 DIAGNOSIS — A0472 Enterocolitis due to Clostridium difficile, not specified as recurrent: Secondary | ICD-10-CM | POA: Diagnosis not present

## 2023-06-15 DIAGNOSIS — K5909 Other constipation: Secondary | ICD-10-CM

## 2023-06-15 DIAGNOSIS — K5900 Constipation, unspecified: Secondary | ICD-10-CM

## 2023-06-15 NOTE — Patient Instructions (Signed)
 Finish vancomycin  oral course If presenting recurrent diarrhea (>4 bowel movements per day), please notify us  to send a longer antibiotic course If presenting normal stools for 2 weeks, please restart Miralax  every day, docusate 100 mg daily and Senna

## 2023-06-15 NOTE — Progress Notes (Unsigned)
 Samantha Cress, M.D. Gastroenterology & Hepatology [facility] [address] [place], [place] [address]  Primary Care Physician: Cook, [name], DO [address] [place] [address]  I will communicate my assessment and recommendations to the referring MD via EMR.  Problems: Stercoral colitis C. difficile colitis  History of Present Illness: Terrance Miller is a 89 y.o. male with history of vascular dementia, chronic constipation c/b stercoral colitis requiring disimpaction, who presents for follow up of stercoral colitis and C. difficile colitis.  The patient was last seen on while hospitalized at [facility] in early May 2025.Aaron Aas At that time, the patient was given laxative regimen with MiraLAX  3 times daily.  She had a previous admission with stercoral colitis which required endoscopic evaluation but no presence of any significant abnormalities were found.  Patient comes to the office with staff from the nursing home where he resides. Patient reports that he "feels good and with no complaints". Patient has advanced dementia and cannot provide any information. Unfortunately, the staff from the NH states "they only bring the pack but I do not have any information about the symptoms".  The staff called Tamaya RN from nursing home who provided the rest of the information.  Patient was recently discharged from [facility] on 06/08/2023 after presenting in episode of worsening diarrhea and sepsis due to C. difficile.  He was given vancomycin  orally with adequate response and was prescribed a 10 day course, which he will finish last dose today at 9 PM. Was on 125 mg every 6 hours.  Per nursing staff, he had 2 stools yesterday night but they are more formed recently. Was watery initially.  Based on the paperwork brought from NH, he has been taking Bisacodyl  suppositories at bedtime on M-W-F, Miralax , docusate and Senna. These  medications have been held since he was last discharged from the hospital.  The nurse denies having any nausea, vomiting, fever, chills, hematochezia, melena, hematemesis, abdominal distention, abdominal pain, jaundice, pruritus or weight loss.   Last Colonoscopy: unknown when was last colonoscoy, had flex sig on 01/28/22 - Stool in the rectum, in the recto- sigmoid colon and in the sigmoid colon. - The sigmoid colon, recto- sigmoid colon and proximal rectum are normal. - Mucosal ulceration in the distal rectum with adhered tissue. Biopsied. - Non- bleeding internal hemorrhoids.  Past Medical History: Past Medical History:  Diagnosis Date   Acute ST elevation myocardial infarction (STEMI) of inferior wall (HCC) 2004   Coronary artery disease    DES x2 to the RCA 2004, residual disease managed medically   Hyperlipidemia    Hypertension    Type 2 diabetes mellitus (HCC)     Past Surgical History: Past Surgical History:  Procedure Laterality Date   BIOPSY  01/28/2022   Procedure: BIOPSY;  Surgeon: Umberto Ganong, Bearl Limes, MD;  [facility]: [facility];  Service: Gastroenterology;;   CATARACT EXTRACTION Bilateral    CORONARY ANGIOPLASTY WITH STENT PLACEMENT  09/05/2002   stent RCA, 80% first diagonal, 70-80% mid-diagonal, 80% mid LAD stenosis   FLEXIBLE SIGMOIDOSCOPY N/A 01/28/2022   Procedure: FLEXIBLE SIGMOIDOSCOPY;  Surgeon: Urban Garden, MD;  Location: [facility];  Service: Gastroenterology;  Laterality: N/A;   HYDROCELE EXCISION Bilateral 11/20/2021   Procedure: HYDROCELECTOMY ADULT;  Surgeon: Mellie Sprinkle., MD;  Location: [facility] ORS;  Service: Urology;  Laterality: Bilateral;   IMPACTION REMOVAL  01/28/2022   Procedure: IMPACTION REMOVAL;  Surgeon: Urban Garden,  MD;  Location: [facility];  Service: Gastroenterology;;   NM MYOCAR PERF WALL MOTION  11/01/2008   Normal    Family History: Family History  Problem Relation Age of Onset    Hypertension Father    Diabetes Sister     Social History: Social History   Tobacco Use  Smoking Status Former   Current packs/day: 0.00   Types: Cigarettes   Quit date: 11/18/2005   Years since quitting: 17.5   Passive exposure: Past  Smokeless Tobacco Never   Social History   Substance and Sexual Activity  Alcohol Use Not Currently   Social History   Substance and Sexual Activity  Drug Use No    Allergies: Allergies  Allergen Reactions   Clobetasol  Other (See Comments)    Unknown  No reaction listed on MAR from facility    Medications: Current Outpatient Medications  Medication Sig Dispense Refill   acetaminophen  (TYLENOL ) 325 MG tablet Take 2 tablets (650 mg total) by mouth every 6 (six) hours as needed for mild pain (pain score 1-3) (or Fever >/= 101). (Patient taking differently: Take 650 mg by mouth every 6 (six) hours as needed for mild pain (pain score 1-3) (or Fever >/= 101). Do not exceed 3 g in 24 hours)     albuterol  (PROVENTIL ) (2.5 MG/3ML) 0.083% nebulizer solution Take 2.5 mg by nebulization every 6 (six) hours as needed for wheezing or shortness of breath.     amLODipine  (NORVASC ) 10 MG tablet Take 1 tablet (10 mg total) by mouth daily. 30 tablet 11   aspirin  EC 81 MG tablet Take 1 tablet (81 mg total) by mouth daily with breakfast. Swallow whole. 90 tablet 3   bisacodyl  (DULCOLAX) 10 MG suppository Place 1 suppository (10 mg total) rectally every Monday, Wednesday, and Friday. (Patient taking differently: Place 10 mg rectally 3 (three) times a week. Monday, Wednesday, and Friday at bedtime) 15 suppository 3   cholecalciferol  (VITAMIN D ) 1000 units tablet Take 1,000 Units by mouth daily.     Cholecalciferol  25 MCG (1000 UT) CHEW Chew by mouth daily at 6 (six) AM. 25 mcg daily     cyanocobalamin  (VITAMIN B12) 1000 MCG tablet Take 1,000 mcg by mouth daily.     docusate sodium  (COLACE) 100 MG capsule Take 100 mg by mouth 2 (two) times daily.     feeding  supplement (ENSURE PLUS HIGH PROTEIN) LIQD Take 237 mLs by mouth 2 (two) times daily between meals.     folic acid  (FOLVITE ) 1 MG tablet Take 1 tablet (1 mg total) by mouth daily. 90 tablet 3   furosemide  (LASIX ) 40 MG tablet Take 40 mg by mouth daily.     latanoprost  (XALATAN ) 0.005 % ophthalmic solution Place 1 drop into both eyes 2 (two) times daily. 2.5 mL 0   melatonin 3 MG TABS tablet Take 3 mg by mouth at bedtime.     memantine  (NAMENDA ) 5 MG tablet Take 5 mg by mouth daily.     polyethylene glycol (MIRALAX  / GLYCOLAX ) 17 g packet Take 17 g by mouth 2 (two) times daily. 60 each 3   rosuvastatin  (CRESTOR ) 5 MG tablet Take 5 mg by mouth daily.     senna (SENOKOT) 8.6 MG TABS tablet Take 2 tablets (17.2 mg total) by mouth at bedtime. 60 tablet 2   sertraline  (ZOLOFT ) 25 MG tablet Take 25 mg by mouth daily.     tamsulosin  (FLOMAX ) 0.4 MG CAPS capsule Take 1 capsule (0.4 mg total)  by mouth daily. 30 capsule 0   vancomycin  (VANCOCIN ) 50 mg/mL SOLN oral solution Take 2.5 mLs (125 mg total) by mouth 4 (four) times daily. X 7 days     No current facility-administered medications for this visit.    Review of Systems: GENERAL: negative for malaise, night sweats HEENT: No changes in hearing or vision, no nose bleeds or other nasal problems. NECK: Negative for lumps, goiter, pain and significant neck swelling RESPIRATORY: Negative for cough, wheezing CARDIOVASCULAR: Negative for chest pain, leg swelling, palpitations, orthopnea GI: SEE HPI MUSCULOSKELETAL: Negative for joint pain or swelling, back pain, and muscle pain. SKIN: Negative for lesions, rash PSYCH: Negative for sleep disturbance, mood disorder and recent psychosocial stressors. HEMATOLOGY Negative for prolonged bleeding, bruising easily, and swollen nodes. ENDOCRINE: Negative for cold or heat intolerance, polyuria, polydipsia and goiter. NEURO: negative for tremor, gait imbalance, syncope and seizures. The remainder of the review of  systems is noncontributory.   Physical Exam: BP 135/70 (BP Location: Left Arm, Patient Position: Sitting, Cuff Size: Large)   Pulse (!) 44   Temp (!) 97.1 F (36.2 C) (Temporal)   Ht 6' (1.829 m)   Wt 173 lb 9.6 oz (78.7 kg) Comment: Weight per Nursing home staff.  BMI 23.54 kg/m  GENERAL: The patient is Awake but Ox0, in no acute distress.  Sitting in wheelchair. HEENT: Head is normocephalic and atraumatic. EOMI are intact. Mouth is well hydrated and without lesions. NECK: Supple. No masses LUNGS: Clear to auscultation. No presence of rhonchi/wheezing/rales. Adequate chest expansion HEART: RRR, normal s1 and s2. ABDOMEN: Soft, nontender, no guarding, no peritoneal signs, and nondistended. BS +. No masses. EXTREMITIES: Without any cyanosis, clubbing, rash, lesions or edema. NEUROLOGIC: Awake, no focal motor deficit. SKIN: no jaundice, no rashes  Imaging/Labs: as above  I personally reviewed and interpreted the available labs, imaging and endoscopic files.  Impression and Plan: Terrance Miller is a 89 y.o. male with history of vascular dementia, chronic constipation c/b stercoral colitis requiring disimpaction, who presents for follow up of stercoral colitis and C. difficile colitis.  Patient has presented recurrent episodes of severe constipation leading to a stercoral colitis that have required an aggressive laxative regimen in the past and hospitalizations.  Unfortunately, he developed an episode of C. difficile and sepsis.  It appears that he has clinically improved after starting his vancomycin  course for the first episode of C. difficile.  As his bowels have significantly improved, he should finish his 10-day course of vancomycin  at same dosage.  I advised that he decreases the ability if he were to have worsening or recurrent symptoms, we may proceed with a vancomycin  taper.  However, if his stools normalize, may restart laxative regimen with MiraLAX , docusate and senna 2 weeks after  today to prevent episodes of recurrent constipation and stercoral colitis.  -Finish vancomycin  oral course - If presenting recurrent diarrhea (>4 bowel movements per day), please notify us  to send a longer antibiotic course - If presenting normal stools for 2 weeks, please restart Miralax  every day, docusate 100 mg daily and Senna  All questions were answered.      Samantha Cress, MD Gastroenterology and Hepatology [facility]

## 2023-06-17 DIAGNOSIS — N1832 Chronic kidney disease, stage 3b: Secondary | ICD-10-CM | POA: Diagnosis not present

## 2023-06-17 DIAGNOSIS — I5033 Acute on chronic diastolic (congestive) heart failure: Secondary | ICD-10-CM | POA: Diagnosis not present

## 2023-06-17 DIAGNOSIS — E1122 Type 2 diabetes mellitus with diabetic chronic kidney disease: Secondary | ICD-10-CM | POA: Diagnosis not present

## 2023-06-24 DIAGNOSIS — Z515 Encounter for palliative care: Secondary | ICD-10-CM | POA: Diagnosis not present

## 2023-06-29 DIAGNOSIS — M6281 Muscle weakness (generalized): Secondary | ICD-10-CM | POA: Diagnosis not present

## 2023-06-29 DIAGNOSIS — I5033 Acute on chronic diastolic (congestive) heart failure: Secondary | ICD-10-CM | POA: Diagnosis not present

## 2023-06-30 DIAGNOSIS — I700 Atherosclerosis of aorta: Secondary | ICD-10-CM | POA: Diagnosis not present

## 2023-06-30 DIAGNOSIS — N1832 Chronic kidney disease, stage 3b: Secondary | ICD-10-CM | POA: Diagnosis not present

## 2023-06-30 DIAGNOSIS — I509 Heart failure, unspecified: Secondary | ICD-10-CM | POA: Diagnosis not present

## 2023-07-01 DIAGNOSIS — M6281 Muscle weakness (generalized): Secondary | ICD-10-CM | POA: Diagnosis not present

## 2023-07-01 DIAGNOSIS — I5033 Acute on chronic diastolic (congestive) heart failure: Secondary | ICD-10-CM | POA: Diagnosis not present

## 2023-07-02 DIAGNOSIS — I509 Heart failure, unspecified: Secondary | ICD-10-CM | POA: Diagnosis not present

## 2023-07-03 DIAGNOSIS — I5033 Acute on chronic diastolic (congestive) heart failure: Secondary | ICD-10-CM | POA: Diagnosis not present

## 2023-07-03 DIAGNOSIS — M6281 Muscle weakness (generalized): Secondary | ICD-10-CM | POA: Diagnosis not present

## 2023-07-06 DIAGNOSIS — I5033 Acute on chronic diastolic (congestive) heart failure: Secondary | ICD-10-CM | POA: Diagnosis not present

## 2023-07-06 DIAGNOSIS — I5022 Chronic systolic (congestive) heart failure: Secondary | ICD-10-CM | POA: Diagnosis not present

## 2023-07-07 DIAGNOSIS — G9341 Metabolic encephalopathy: Secondary | ICD-10-CM | POA: Diagnosis not present

## 2023-07-07 DIAGNOSIS — I5033 Acute on chronic diastolic (congestive) heart failure: Secondary | ICD-10-CM | POA: Diagnosis not present

## 2023-07-07 DIAGNOSIS — I2510 Atherosclerotic heart disease of native coronary artery without angina pectoris: Secondary | ICD-10-CM | POA: Diagnosis not present

## 2023-07-07 DIAGNOSIS — M6281 Muscle weakness (generalized): Secondary | ICD-10-CM | POA: Diagnosis not present

## 2023-07-07 DIAGNOSIS — I69891 Dysphagia following other cerebrovascular disease: Secondary | ICD-10-CM | POA: Diagnosis not present

## 2023-07-07 DIAGNOSIS — M6282 Rhabdomyolysis: Secondary | ICD-10-CM | POA: Diagnosis not present

## 2023-07-07 DIAGNOSIS — R1312 Dysphagia, oropharyngeal phase: Secondary | ICD-10-CM | POA: Diagnosis not present

## 2023-07-07 DIAGNOSIS — R278 Other lack of coordination: Secondary | ICD-10-CM | POA: Diagnosis not present

## 2023-07-08 DIAGNOSIS — R1312 Dysphagia, oropharyngeal phase: Secondary | ICD-10-CM | POA: Diagnosis not present

## 2023-07-08 DIAGNOSIS — I2510 Atherosclerotic heart disease of native coronary artery without angina pectoris: Secondary | ICD-10-CM | POA: Diagnosis not present

## 2023-07-08 DIAGNOSIS — G9341 Metabolic encephalopathy: Secondary | ICD-10-CM | POA: Diagnosis not present

## 2023-07-08 DIAGNOSIS — I5033 Acute on chronic diastolic (congestive) heart failure: Secondary | ICD-10-CM | POA: Diagnosis not present

## 2023-07-08 DIAGNOSIS — M6281 Muscle weakness (generalized): Secondary | ICD-10-CM | POA: Diagnosis not present

## 2023-07-08 DIAGNOSIS — I69891 Dysphagia following other cerebrovascular disease: Secondary | ICD-10-CM | POA: Diagnosis not present

## 2023-07-08 DIAGNOSIS — M6282 Rhabdomyolysis: Secondary | ICD-10-CM | POA: Diagnosis not present

## 2023-07-08 DIAGNOSIS — R278 Other lack of coordination: Secondary | ICD-10-CM | POA: Diagnosis not present

## 2023-07-09 DIAGNOSIS — I5033 Acute on chronic diastolic (congestive) heart failure: Secondary | ICD-10-CM | POA: Diagnosis not present

## 2023-07-09 DIAGNOSIS — R278 Other lack of coordination: Secondary | ICD-10-CM | POA: Diagnosis not present

## 2023-07-09 DIAGNOSIS — G9341 Metabolic encephalopathy: Secondary | ICD-10-CM | POA: Diagnosis not present

## 2023-07-09 DIAGNOSIS — R1312 Dysphagia, oropharyngeal phase: Secondary | ICD-10-CM | POA: Diagnosis not present

## 2023-07-09 DIAGNOSIS — M6282 Rhabdomyolysis: Secondary | ICD-10-CM | POA: Diagnosis not present

## 2023-07-09 DIAGNOSIS — M6281 Muscle weakness (generalized): Secondary | ICD-10-CM | POA: Diagnosis not present

## 2023-07-09 DIAGNOSIS — I69891 Dysphagia following other cerebrovascular disease: Secondary | ICD-10-CM | POA: Diagnosis not present

## 2023-07-09 DIAGNOSIS — I2510 Atherosclerotic heart disease of native coronary artery without angina pectoris: Secondary | ICD-10-CM | POA: Diagnosis not present

## 2023-07-10 DIAGNOSIS — M6282 Rhabdomyolysis: Secondary | ICD-10-CM | POA: Diagnosis not present

## 2023-07-10 DIAGNOSIS — I5033 Acute on chronic diastolic (congestive) heart failure: Secondary | ICD-10-CM | POA: Diagnosis not present

## 2023-07-10 DIAGNOSIS — G9341 Metabolic encephalopathy: Secondary | ICD-10-CM | POA: Diagnosis not present

## 2023-07-10 DIAGNOSIS — I69891 Dysphagia following other cerebrovascular disease: Secondary | ICD-10-CM | POA: Diagnosis not present

## 2023-07-10 DIAGNOSIS — R1312 Dysphagia, oropharyngeal phase: Secondary | ICD-10-CM | POA: Diagnosis not present

## 2023-07-10 DIAGNOSIS — I2510 Atherosclerotic heart disease of native coronary artery without angina pectoris: Secondary | ICD-10-CM | POA: Diagnosis not present

## 2023-07-10 DIAGNOSIS — R278 Other lack of coordination: Secondary | ICD-10-CM | POA: Diagnosis not present

## 2023-07-10 DIAGNOSIS — M6281 Muscle weakness (generalized): Secondary | ICD-10-CM | POA: Diagnosis not present

## 2023-07-11 DIAGNOSIS — I69891 Dysphagia following other cerebrovascular disease: Secondary | ICD-10-CM | POA: Diagnosis not present

## 2023-07-11 DIAGNOSIS — M6281 Muscle weakness (generalized): Secondary | ICD-10-CM | POA: Diagnosis not present

## 2023-07-11 DIAGNOSIS — M6282 Rhabdomyolysis: Secondary | ICD-10-CM | POA: Diagnosis not present

## 2023-07-11 DIAGNOSIS — G9341 Metabolic encephalopathy: Secondary | ICD-10-CM | POA: Diagnosis not present

## 2023-07-11 DIAGNOSIS — R1312 Dysphagia, oropharyngeal phase: Secondary | ICD-10-CM | POA: Diagnosis not present

## 2023-07-11 DIAGNOSIS — I10 Essential (primary) hypertension: Secondary | ICD-10-CM | POA: Diagnosis not present

## 2023-07-11 DIAGNOSIS — I5033 Acute on chronic diastolic (congestive) heart failure: Secondary | ICD-10-CM | POA: Diagnosis not present

## 2023-07-11 DIAGNOSIS — R278 Other lack of coordination: Secondary | ICD-10-CM | POA: Diagnosis not present

## 2023-07-11 DIAGNOSIS — I2510 Atherosclerotic heart disease of native coronary artery without angina pectoris: Secondary | ICD-10-CM | POA: Diagnosis not present

## 2023-07-13 DIAGNOSIS — R278 Other lack of coordination: Secondary | ICD-10-CM | POA: Diagnosis not present

## 2023-07-13 DIAGNOSIS — R1312 Dysphagia, oropharyngeal phase: Secondary | ICD-10-CM | POA: Diagnosis not present

## 2023-07-13 DIAGNOSIS — M6281 Muscle weakness (generalized): Secondary | ICD-10-CM | POA: Diagnosis not present

## 2023-07-13 DIAGNOSIS — I69891 Dysphagia following other cerebrovascular disease: Secondary | ICD-10-CM | POA: Diagnosis not present

## 2023-07-13 DIAGNOSIS — G9341 Metabolic encephalopathy: Secondary | ICD-10-CM | POA: Diagnosis not present

## 2023-07-13 DIAGNOSIS — I5033 Acute on chronic diastolic (congestive) heart failure: Secondary | ICD-10-CM | POA: Diagnosis not present

## 2023-07-13 DIAGNOSIS — I2510 Atherosclerotic heart disease of native coronary artery without angina pectoris: Secondary | ICD-10-CM | POA: Diagnosis not present

## 2023-07-13 DIAGNOSIS — M6282 Rhabdomyolysis: Secondary | ICD-10-CM | POA: Diagnosis not present

## 2023-07-14 DIAGNOSIS — R1312 Dysphagia, oropharyngeal phase: Secondary | ICD-10-CM | POA: Diagnosis not present

## 2023-07-14 DIAGNOSIS — G9341 Metabolic encephalopathy: Secondary | ICD-10-CM | POA: Diagnosis not present

## 2023-07-14 DIAGNOSIS — I5033 Acute on chronic diastolic (congestive) heart failure: Secondary | ICD-10-CM | POA: Diagnosis not present

## 2023-07-14 DIAGNOSIS — I69891 Dysphagia following other cerebrovascular disease: Secondary | ICD-10-CM | POA: Diagnosis not present

## 2023-07-14 DIAGNOSIS — I2510 Atherosclerotic heart disease of native coronary artery without angina pectoris: Secondary | ICD-10-CM | POA: Diagnosis not present

## 2023-07-14 DIAGNOSIS — M6281 Muscle weakness (generalized): Secondary | ICD-10-CM | POA: Diagnosis not present

## 2023-07-14 DIAGNOSIS — R278 Other lack of coordination: Secondary | ICD-10-CM | POA: Diagnosis not present

## 2023-07-14 DIAGNOSIS — M6282 Rhabdomyolysis: Secondary | ICD-10-CM | POA: Diagnosis not present

## 2023-07-15 DIAGNOSIS — R1312 Dysphagia, oropharyngeal phase: Secondary | ICD-10-CM | POA: Diagnosis not present

## 2023-07-15 DIAGNOSIS — G9341 Metabolic encephalopathy: Secondary | ICD-10-CM | POA: Diagnosis not present

## 2023-07-15 DIAGNOSIS — R278 Other lack of coordination: Secondary | ICD-10-CM | POA: Diagnosis not present

## 2023-07-15 DIAGNOSIS — M6282 Rhabdomyolysis: Secondary | ICD-10-CM | POA: Diagnosis not present

## 2023-07-15 DIAGNOSIS — I69891 Dysphagia following other cerebrovascular disease: Secondary | ICD-10-CM | POA: Diagnosis not present

## 2023-07-15 DIAGNOSIS — I2510 Atherosclerotic heart disease of native coronary artery without angina pectoris: Secondary | ICD-10-CM | POA: Diagnosis not present

## 2023-07-15 DIAGNOSIS — M6281 Muscle weakness (generalized): Secondary | ICD-10-CM | POA: Diagnosis not present

## 2023-07-15 DIAGNOSIS — I5033 Acute on chronic diastolic (congestive) heart failure: Secondary | ICD-10-CM | POA: Diagnosis not present

## 2023-07-16 DIAGNOSIS — I69891 Dysphagia following other cerebrovascular disease: Secondary | ICD-10-CM | POA: Diagnosis not present

## 2023-07-16 DIAGNOSIS — I5033 Acute on chronic diastolic (congestive) heart failure: Secondary | ICD-10-CM | POA: Diagnosis not present

## 2023-07-16 DIAGNOSIS — I2510 Atherosclerotic heart disease of native coronary artery without angina pectoris: Secondary | ICD-10-CM | POA: Diagnosis not present

## 2023-07-16 DIAGNOSIS — M6281 Muscle weakness (generalized): Secondary | ICD-10-CM | POA: Diagnosis not present

## 2023-07-16 DIAGNOSIS — G9341 Metabolic encephalopathy: Secondary | ICD-10-CM | POA: Diagnosis not present

## 2023-07-16 DIAGNOSIS — R1312 Dysphagia, oropharyngeal phase: Secondary | ICD-10-CM | POA: Diagnosis not present

## 2023-07-16 DIAGNOSIS — M6282 Rhabdomyolysis: Secondary | ICD-10-CM | POA: Diagnosis not present

## 2023-07-16 DIAGNOSIS — R278 Other lack of coordination: Secondary | ICD-10-CM | POA: Diagnosis not present

## 2023-07-17 DIAGNOSIS — I5033 Acute on chronic diastolic (congestive) heart failure: Secondary | ICD-10-CM | POA: Diagnosis not present

## 2023-07-17 DIAGNOSIS — G9341 Metabolic encephalopathy: Secondary | ICD-10-CM | POA: Diagnosis not present

## 2023-07-17 DIAGNOSIS — I2510 Atherosclerotic heart disease of native coronary artery without angina pectoris: Secondary | ICD-10-CM | POA: Diagnosis not present

## 2023-07-17 DIAGNOSIS — M6281 Muscle weakness (generalized): Secondary | ICD-10-CM | POA: Diagnosis not present

## 2023-07-17 DIAGNOSIS — R278 Other lack of coordination: Secondary | ICD-10-CM | POA: Diagnosis not present

## 2023-07-17 DIAGNOSIS — I69891 Dysphagia following other cerebrovascular disease: Secondary | ICD-10-CM | POA: Diagnosis not present

## 2023-07-17 DIAGNOSIS — M6282 Rhabdomyolysis: Secondary | ICD-10-CM | POA: Diagnosis not present

## 2023-07-17 DIAGNOSIS — R1312 Dysphagia, oropharyngeal phase: Secondary | ICD-10-CM | POA: Diagnosis not present

## 2023-07-18 DIAGNOSIS — M6282 Rhabdomyolysis: Secondary | ICD-10-CM | POA: Diagnosis not present

## 2023-07-18 DIAGNOSIS — R1312 Dysphagia, oropharyngeal phase: Secondary | ICD-10-CM | POA: Diagnosis not present

## 2023-07-18 DIAGNOSIS — R278 Other lack of coordination: Secondary | ICD-10-CM | POA: Diagnosis not present

## 2023-07-18 DIAGNOSIS — I69891 Dysphagia following other cerebrovascular disease: Secondary | ICD-10-CM | POA: Diagnosis not present

## 2023-07-18 DIAGNOSIS — I5033 Acute on chronic diastolic (congestive) heart failure: Secondary | ICD-10-CM | POA: Diagnosis not present

## 2023-07-18 DIAGNOSIS — G9341 Metabolic encephalopathy: Secondary | ICD-10-CM | POA: Diagnosis not present

## 2023-07-18 DIAGNOSIS — I2510 Atherosclerotic heart disease of native coronary artery without angina pectoris: Secondary | ICD-10-CM | POA: Diagnosis not present

## 2023-07-18 DIAGNOSIS — M6281 Muscle weakness (generalized): Secondary | ICD-10-CM | POA: Diagnosis not present

## 2023-07-20 DIAGNOSIS — M6281 Muscle weakness (generalized): Secondary | ICD-10-CM | POA: Diagnosis not present

## 2023-07-20 DIAGNOSIS — I2510 Atherosclerotic heart disease of native coronary artery without angina pectoris: Secondary | ICD-10-CM | POA: Diagnosis not present

## 2023-07-20 DIAGNOSIS — G9341 Metabolic encephalopathy: Secondary | ICD-10-CM | POA: Diagnosis not present

## 2023-07-20 DIAGNOSIS — M6282 Rhabdomyolysis: Secondary | ICD-10-CM | POA: Diagnosis not present

## 2023-07-20 DIAGNOSIS — I69891 Dysphagia following other cerebrovascular disease: Secondary | ICD-10-CM | POA: Diagnosis not present

## 2023-07-20 DIAGNOSIS — R1312 Dysphagia, oropharyngeal phase: Secondary | ICD-10-CM | POA: Diagnosis not present

## 2023-07-20 DIAGNOSIS — R278 Other lack of coordination: Secondary | ICD-10-CM | POA: Diagnosis not present

## 2023-07-20 DIAGNOSIS — I5033 Acute on chronic diastolic (congestive) heart failure: Secondary | ICD-10-CM | POA: Diagnosis not present

## 2023-07-21 DIAGNOSIS — M6282 Rhabdomyolysis: Secondary | ICD-10-CM | POA: Diagnosis not present

## 2023-07-21 DIAGNOSIS — R1312 Dysphagia, oropharyngeal phase: Secondary | ICD-10-CM | POA: Diagnosis not present

## 2023-07-21 DIAGNOSIS — I2510 Atherosclerotic heart disease of native coronary artery without angina pectoris: Secondary | ICD-10-CM | POA: Diagnosis not present

## 2023-07-21 DIAGNOSIS — G9341 Metabolic encephalopathy: Secondary | ICD-10-CM | POA: Diagnosis not present

## 2023-07-21 DIAGNOSIS — I5033 Acute on chronic diastolic (congestive) heart failure: Secondary | ICD-10-CM | POA: Diagnosis not present

## 2023-07-21 DIAGNOSIS — R278 Other lack of coordination: Secondary | ICD-10-CM | POA: Diagnosis not present

## 2023-07-21 DIAGNOSIS — M6281 Muscle weakness (generalized): Secondary | ICD-10-CM | POA: Diagnosis not present

## 2023-07-21 DIAGNOSIS — I69891 Dysphagia following other cerebrovascular disease: Secondary | ICD-10-CM | POA: Diagnosis not present

## 2023-07-22 DIAGNOSIS — R1312 Dysphagia, oropharyngeal phase: Secondary | ICD-10-CM | POA: Diagnosis not present

## 2023-07-22 DIAGNOSIS — M6282 Rhabdomyolysis: Secondary | ICD-10-CM | POA: Diagnosis not present

## 2023-07-22 DIAGNOSIS — G9341 Metabolic encephalopathy: Secondary | ICD-10-CM | POA: Diagnosis not present

## 2023-07-22 DIAGNOSIS — R278 Other lack of coordination: Secondary | ICD-10-CM | POA: Diagnosis not present

## 2023-07-22 DIAGNOSIS — M6281 Muscle weakness (generalized): Secondary | ICD-10-CM | POA: Diagnosis not present

## 2023-07-22 DIAGNOSIS — I2510 Atherosclerotic heart disease of native coronary artery without angina pectoris: Secondary | ICD-10-CM | POA: Diagnosis not present

## 2023-07-22 DIAGNOSIS — I5033 Acute on chronic diastolic (congestive) heart failure: Secondary | ICD-10-CM | POA: Diagnosis not present

## 2023-07-22 DIAGNOSIS — I69891 Dysphagia following other cerebrovascular disease: Secondary | ICD-10-CM | POA: Diagnosis not present

## 2023-07-23 DIAGNOSIS — M6281 Muscle weakness (generalized): Secondary | ICD-10-CM | POA: Diagnosis not present

## 2023-07-23 DIAGNOSIS — I69891 Dysphagia following other cerebrovascular disease: Secondary | ICD-10-CM | POA: Diagnosis not present

## 2023-07-23 DIAGNOSIS — M6282 Rhabdomyolysis: Secondary | ICD-10-CM | POA: Diagnosis not present

## 2023-07-23 DIAGNOSIS — G9341 Metabolic encephalopathy: Secondary | ICD-10-CM | POA: Diagnosis not present

## 2023-07-23 DIAGNOSIS — I2510 Atherosclerotic heart disease of native coronary artery without angina pectoris: Secondary | ICD-10-CM | POA: Diagnosis not present

## 2023-07-23 DIAGNOSIS — R278 Other lack of coordination: Secondary | ICD-10-CM | POA: Diagnosis not present

## 2023-07-23 DIAGNOSIS — I5033 Acute on chronic diastolic (congestive) heart failure: Secondary | ICD-10-CM | POA: Diagnosis not present

## 2023-07-23 DIAGNOSIS — R1312 Dysphagia, oropharyngeal phase: Secondary | ICD-10-CM | POA: Diagnosis not present

## 2023-07-24 DIAGNOSIS — I69891 Dysphagia following other cerebrovascular disease: Secondary | ICD-10-CM | POA: Diagnosis not present

## 2023-07-24 DIAGNOSIS — I2510 Atherosclerotic heart disease of native coronary artery without angina pectoris: Secondary | ICD-10-CM | POA: Diagnosis not present

## 2023-07-24 DIAGNOSIS — M6282 Rhabdomyolysis: Secondary | ICD-10-CM | POA: Diagnosis not present

## 2023-07-24 DIAGNOSIS — R1312 Dysphagia, oropharyngeal phase: Secondary | ICD-10-CM | POA: Diagnosis not present

## 2023-07-24 DIAGNOSIS — M6281 Muscle weakness (generalized): Secondary | ICD-10-CM | POA: Diagnosis not present

## 2023-07-24 DIAGNOSIS — I5033 Acute on chronic diastolic (congestive) heart failure: Secondary | ICD-10-CM | POA: Diagnosis not present

## 2023-07-24 DIAGNOSIS — R278 Other lack of coordination: Secondary | ICD-10-CM | POA: Diagnosis not present

## 2023-07-24 DIAGNOSIS — G9341 Metabolic encephalopathy: Secondary | ICD-10-CM | POA: Diagnosis not present

## 2023-07-27 DIAGNOSIS — R1312 Dysphagia, oropharyngeal phase: Secondary | ICD-10-CM | POA: Diagnosis not present

## 2023-07-27 DIAGNOSIS — M6282 Rhabdomyolysis: Secondary | ICD-10-CM | POA: Diagnosis not present

## 2023-07-27 DIAGNOSIS — R278 Other lack of coordination: Secondary | ICD-10-CM | POA: Diagnosis not present

## 2023-07-27 DIAGNOSIS — I69891 Dysphagia following other cerebrovascular disease: Secondary | ICD-10-CM | POA: Diagnosis not present

## 2023-07-27 DIAGNOSIS — I2510 Atherosclerotic heart disease of native coronary artery without angina pectoris: Secondary | ICD-10-CM | POA: Diagnosis not present

## 2023-07-27 DIAGNOSIS — I5033 Acute on chronic diastolic (congestive) heart failure: Secondary | ICD-10-CM | POA: Diagnosis not present

## 2023-07-27 DIAGNOSIS — M6281 Muscle weakness (generalized): Secondary | ICD-10-CM | POA: Diagnosis not present

## 2023-07-27 DIAGNOSIS — G9341 Metabolic encephalopathy: Secondary | ICD-10-CM | POA: Diagnosis not present

## 2023-07-28 DIAGNOSIS — M6282 Rhabdomyolysis: Secondary | ICD-10-CM | POA: Diagnosis not present

## 2023-07-28 DIAGNOSIS — I2510 Atherosclerotic heart disease of native coronary artery without angina pectoris: Secondary | ICD-10-CM | POA: Diagnosis not present

## 2023-07-28 DIAGNOSIS — R1312 Dysphagia, oropharyngeal phase: Secondary | ICD-10-CM | POA: Diagnosis not present

## 2023-07-28 DIAGNOSIS — G9341 Metabolic encephalopathy: Secondary | ICD-10-CM | POA: Diagnosis not present

## 2023-07-28 DIAGNOSIS — I5033 Acute on chronic diastolic (congestive) heart failure: Secondary | ICD-10-CM | POA: Diagnosis not present

## 2023-07-28 DIAGNOSIS — R278 Other lack of coordination: Secondary | ICD-10-CM | POA: Diagnosis not present

## 2023-07-28 DIAGNOSIS — M6281 Muscle weakness (generalized): Secondary | ICD-10-CM | POA: Diagnosis not present

## 2023-07-28 DIAGNOSIS — I69891 Dysphagia following other cerebrovascular disease: Secondary | ICD-10-CM | POA: Diagnosis not present

## 2023-07-29 DIAGNOSIS — M6281 Muscle weakness (generalized): Secondary | ICD-10-CM | POA: Diagnosis not present

## 2023-07-29 DIAGNOSIS — I2510 Atherosclerotic heart disease of native coronary artery without angina pectoris: Secondary | ICD-10-CM | POA: Diagnosis not present

## 2023-07-29 DIAGNOSIS — M6282 Rhabdomyolysis: Secondary | ICD-10-CM | POA: Diagnosis not present

## 2023-07-29 DIAGNOSIS — G9341 Metabolic encephalopathy: Secondary | ICD-10-CM | POA: Diagnosis not present

## 2023-07-29 DIAGNOSIS — I69891 Dysphagia following other cerebrovascular disease: Secondary | ICD-10-CM | POA: Diagnosis not present

## 2023-07-29 DIAGNOSIS — R278 Other lack of coordination: Secondary | ICD-10-CM | POA: Diagnosis not present

## 2023-07-29 DIAGNOSIS — I5033 Acute on chronic diastolic (congestive) heart failure: Secondary | ICD-10-CM | POA: Diagnosis not present

## 2023-07-29 DIAGNOSIS — R1312 Dysphagia, oropharyngeal phase: Secondary | ICD-10-CM | POA: Diagnosis not present

## 2023-07-30 DIAGNOSIS — I5033 Acute on chronic diastolic (congestive) heart failure: Secondary | ICD-10-CM | POA: Diagnosis not present

## 2023-07-30 DIAGNOSIS — R1312 Dysphagia, oropharyngeal phase: Secondary | ICD-10-CM | POA: Diagnosis not present

## 2023-07-30 DIAGNOSIS — I2510 Atherosclerotic heart disease of native coronary artery without angina pectoris: Secondary | ICD-10-CM | POA: Diagnosis not present

## 2023-07-30 DIAGNOSIS — I69891 Dysphagia following other cerebrovascular disease: Secondary | ICD-10-CM | POA: Diagnosis not present

## 2023-07-30 DIAGNOSIS — M6282 Rhabdomyolysis: Secondary | ICD-10-CM | POA: Diagnosis not present

## 2023-07-30 DIAGNOSIS — G9341 Metabolic encephalopathy: Secondary | ICD-10-CM | POA: Diagnosis not present

## 2023-07-30 DIAGNOSIS — R278 Other lack of coordination: Secondary | ICD-10-CM | POA: Diagnosis not present

## 2023-07-30 DIAGNOSIS — M6281 Muscle weakness (generalized): Secondary | ICD-10-CM | POA: Diagnosis not present

## 2023-07-31 ENCOUNTER — Ambulatory Visit: Payer: Medicare Other

## 2023-08-03 DIAGNOSIS — M6282 Rhabdomyolysis: Secondary | ICD-10-CM | POA: Diagnosis not present

## 2023-08-03 DIAGNOSIS — R278 Other lack of coordination: Secondary | ICD-10-CM | POA: Diagnosis not present

## 2023-08-03 DIAGNOSIS — R1312 Dysphagia, oropharyngeal phase: Secondary | ICD-10-CM | POA: Diagnosis not present

## 2023-08-03 DIAGNOSIS — I69891 Dysphagia following other cerebrovascular disease: Secondary | ICD-10-CM | POA: Diagnosis not present

## 2023-08-03 DIAGNOSIS — I5033 Acute on chronic diastolic (congestive) heart failure: Secondary | ICD-10-CM | POA: Diagnosis not present

## 2023-08-03 DIAGNOSIS — M6281 Muscle weakness (generalized): Secondary | ICD-10-CM | POA: Diagnosis not present

## 2023-08-03 DIAGNOSIS — I2510 Atherosclerotic heart disease of native coronary artery without angina pectoris: Secondary | ICD-10-CM | POA: Diagnosis not present

## 2023-08-03 DIAGNOSIS — G9341 Metabolic encephalopathy: Secondary | ICD-10-CM | POA: Diagnosis not present

## 2023-08-04 DIAGNOSIS — I5033 Acute on chronic diastolic (congestive) heart failure: Secondary | ICD-10-CM | POA: Diagnosis not present

## 2023-08-04 DIAGNOSIS — E1122 Type 2 diabetes mellitus with diabetic chronic kidney disease: Secondary | ICD-10-CM | POA: Diagnosis not present

## 2023-08-05 DIAGNOSIS — N1832 Chronic kidney disease, stage 3b: Secondary | ICD-10-CM | POA: Diagnosis not present

## 2023-08-05 DIAGNOSIS — H409 Unspecified glaucoma: Secondary | ICD-10-CM | POA: Diagnosis not present

## 2023-08-05 DIAGNOSIS — I5022 Chronic systolic (congestive) heart failure: Secondary | ICD-10-CM | POA: Diagnosis not present

## 2023-08-05 DIAGNOSIS — I5033 Acute on chronic diastolic (congestive) heart failure: Secondary | ICD-10-CM | POA: Diagnosis not present

## 2023-08-06 DIAGNOSIS — I69891 Dysphagia following other cerebrovascular disease: Secondary | ICD-10-CM | POA: Diagnosis not present

## 2023-08-06 DIAGNOSIS — M6281 Muscle weakness (generalized): Secondary | ICD-10-CM | POA: Diagnosis not present

## 2023-08-06 DIAGNOSIS — R278 Other lack of coordination: Secondary | ICD-10-CM | POA: Diagnosis not present

## 2023-08-06 DIAGNOSIS — I2510 Atherosclerotic heart disease of native coronary artery without angina pectoris: Secondary | ICD-10-CM | POA: Diagnosis not present

## 2023-08-06 DIAGNOSIS — R1312 Dysphagia, oropharyngeal phase: Secondary | ICD-10-CM | POA: Diagnosis not present

## 2023-08-06 DIAGNOSIS — M6282 Rhabdomyolysis: Secondary | ICD-10-CM | POA: Diagnosis not present

## 2023-08-06 DIAGNOSIS — I5033 Acute on chronic diastolic (congestive) heart failure: Secondary | ICD-10-CM | POA: Diagnosis not present

## 2023-08-06 DIAGNOSIS — G9341 Metabolic encephalopathy: Secondary | ICD-10-CM | POA: Diagnosis not present

## 2023-08-07 ENCOUNTER — Ambulatory Visit

## 2023-08-07 DIAGNOSIS — I69891 Dysphagia following other cerebrovascular disease: Secondary | ICD-10-CM | POA: Diagnosis not present

## 2023-08-07 DIAGNOSIS — R1312 Dysphagia, oropharyngeal phase: Secondary | ICD-10-CM | POA: Diagnosis not present

## 2023-08-10 DIAGNOSIS — I69891 Dysphagia following other cerebrovascular disease: Secondary | ICD-10-CM | POA: Diagnosis not present

## 2023-08-10 DIAGNOSIS — R1312 Dysphagia, oropharyngeal phase: Secondary | ICD-10-CM | POA: Diagnosis not present

## 2023-08-11 DIAGNOSIS — I69891 Dysphagia following other cerebrovascular disease: Secondary | ICD-10-CM | POA: Diagnosis not present

## 2023-08-11 DIAGNOSIS — R1312 Dysphagia, oropharyngeal phase: Secondary | ICD-10-CM | POA: Diagnosis not present

## 2023-08-12 DIAGNOSIS — R1312 Dysphagia, oropharyngeal phase: Secondary | ICD-10-CM | POA: Diagnosis not present

## 2023-08-12 DIAGNOSIS — I69891 Dysphagia following other cerebrovascular disease: Secondary | ICD-10-CM | POA: Diagnosis not present

## 2023-08-14 DIAGNOSIS — I69891 Dysphagia following other cerebrovascular disease: Secondary | ICD-10-CM | POA: Diagnosis not present

## 2023-08-14 DIAGNOSIS — R1312 Dysphagia, oropharyngeal phase: Secondary | ICD-10-CM | POA: Diagnosis not present

## 2023-08-18 DIAGNOSIS — R1312 Dysphagia, oropharyngeal phase: Secondary | ICD-10-CM | POA: Diagnosis not present

## 2023-08-18 DIAGNOSIS — I69891 Dysphagia following other cerebrovascular disease: Secondary | ICD-10-CM | POA: Diagnosis not present

## 2023-08-24 DIAGNOSIS — I69891 Dysphagia following other cerebrovascular disease: Secondary | ICD-10-CM | POA: Diagnosis not present

## 2023-08-24 DIAGNOSIS — R1312 Dysphagia, oropharyngeal phase: Secondary | ICD-10-CM | POA: Diagnosis not present

## 2023-08-25 DIAGNOSIS — I69891 Dysphagia following other cerebrovascular disease: Secondary | ICD-10-CM | POA: Diagnosis not present

## 2023-08-25 DIAGNOSIS — R1312 Dysphagia, oropharyngeal phase: Secondary | ICD-10-CM | POA: Diagnosis not present

## 2023-08-26 DIAGNOSIS — I5033 Acute on chronic diastolic (congestive) heart failure: Secondary | ICD-10-CM | POA: Diagnosis not present

## 2023-08-26 DIAGNOSIS — I69891 Dysphagia following other cerebrovascular disease: Secondary | ICD-10-CM | POA: Diagnosis not present

## 2023-08-26 DIAGNOSIS — R1312 Dysphagia, oropharyngeal phase: Secondary | ICD-10-CM | POA: Diagnosis not present

## 2023-08-26 DIAGNOSIS — M6281 Muscle weakness (generalized): Secondary | ICD-10-CM | POA: Diagnosis not present

## 2023-08-27 DIAGNOSIS — I69891 Dysphagia following other cerebrovascular disease: Secondary | ICD-10-CM | POA: Diagnosis not present

## 2023-08-27 DIAGNOSIS — R1312 Dysphagia, oropharyngeal phase: Secondary | ICD-10-CM | POA: Diagnosis not present

## 2023-09-01 DIAGNOSIS — I69891 Dysphagia following other cerebrovascular disease: Secondary | ICD-10-CM | POA: Diagnosis not present

## 2023-09-01 DIAGNOSIS — R1312 Dysphagia, oropharyngeal phase: Secondary | ICD-10-CM | POA: Diagnosis not present

## 2023-09-02 DIAGNOSIS — R1312 Dysphagia, oropharyngeal phase: Secondary | ICD-10-CM | POA: Diagnosis not present

## 2023-09-02 DIAGNOSIS — I69891 Dysphagia following other cerebrovascular disease: Secondary | ICD-10-CM | POA: Diagnosis not present

## 2023-09-03 DIAGNOSIS — R1312 Dysphagia, oropharyngeal phase: Secondary | ICD-10-CM | POA: Diagnosis not present

## 2023-09-03 DIAGNOSIS — I69891 Dysphagia following other cerebrovascular disease: Secondary | ICD-10-CM | POA: Diagnosis not present

## 2023-09-04 DIAGNOSIS — Z130 Encounter for screening for diseases of the blood and blood-forming organs and certain disorders involving the immune mechanism: Secondary | ICD-10-CM | POA: Diagnosis not present

## 2023-09-04 DIAGNOSIS — I509 Heart failure, unspecified: Secondary | ICD-10-CM | POA: Diagnosis not present

## 2023-09-04 DIAGNOSIS — E119 Type 2 diabetes mellitus without complications: Secondary | ICD-10-CM | POA: Diagnosis not present

## 2023-09-04 DIAGNOSIS — I10 Essential (primary) hypertension: Secondary | ICD-10-CM | POA: Diagnosis not present

## 2023-09-04 DIAGNOSIS — N1832 Chronic kidney disease, stage 3b: Secondary | ICD-10-CM | POA: Diagnosis not present

## 2023-09-04 DIAGNOSIS — Z13220 Encounter for screening for lipoid disorders: Secondary | ICD-10-CM | POA: Diagnosis not present

## 2023-09-07 DIAGNOSIS — R278 Other lack of coordination: Secondary | ICD-10-CM | POA: Diagnosis not present

## 2023-09-07 DIAGNOSIS — E1122 Type 2 diabetes mellitus with diabetic chronic kidney disease: Secondary | ICD-10-CM | POA: Diagnosis not present

## 2023-09-07 DIAGNOSIS — I69891 Dysphagia following other cerebrovascular disease: Secondary | ICD-10-CM | POA: Diagnosis not present

## 2023-09-08 DIAGNOSIS — E1122 Type 2 diabetes mellitus with diabetic chronic kidney disease: Secondary | ICD-10-CM | POA: Diagnosis not present

## 2023-09-08 DIAGNOSIS — R1312 Dysphagia, oropharyngeal phase: Secondary | ICD-10-CM | POA: Diagnosis not present

## 2023-09-08 DIAGNOSIS — I69891 Dysphagia following other cerebrovascular disease: Secondary | ICD-10-CM | POA: Diagnosis not present

## 2023-09-08 DIAGNOSIS — R278 Other lack of coordination: Secondary | ICD-10-CM | POA: Diagnosis not present

## 2023-09-09 DIAGNOSIS — R1312 Dysphagia, oropharyngeal phase: Secondary | ICD-10-CM | POA: Diagnosis not present

## 2023-09-09 DIAGNOSIS — R7981 Abnormal blood-gas level: Secondary | ICD-10-CM | POA: Diagnosis not present

## 2023-09-09 DIAGNOSIS — R278 Other lack of coordination: Secondary | ICD-10-CM | POA: Diagnosis not present

## 2023-09-09 DIAGNOSIS — R680 Hypothermia, not associated with low environmental temperature: Secondary | ICD-10-CM | POA: Diagnosis not present

## 2023-09-09 DIAGNOSIS — I69891 Dysphagia following other cerebrovascular disease: Secondary | ICD-10-CM | POA: Diagnosis not present

## 2023-09-09 DIAGNOSIS — E119 Type 2 diabetes mellitus without complications: Secondary | ICD-10-CM | POA: Diagnosis not present

## 2023-09-09 DIAGNOSIS — E1122 Type 2 diabetes mellitus with diabetic chronic kidney disease: Secondary | ICD-10-CM | POA: Diagnosis not present

## 2023-09-09 NOTE — Progress Notes (Unsigned)
 Cardiology Office Note    Date:  09/10/2023  ID:  Terrance Miller, DOB 06/25/1928, MRN [id number] Cardiologist: Diannah SHAUNNA Maywood, MD Cardiology APP:  Johnson Laymon HERO, PA-C { :  History of Present Illness:    Terrance Miller is a 89 y.o. male with past medical history of CAD (s/p STEMI in 2004 with DES to RCA), HTN, HLD, pulmonary hypertension and dementia who presents to the office today for overdue annual follow-up.  He was examined by Dr. Mallipeddi in 03/2022 for preoperative cardiac clearance and was wheelchair-bound at that time. Prior echocardiogram had shown severe pulmonary hypertension and given his age, further evaluation was not pursued. In the interim, he has experienced multiple admissions. Was admitted for influenza A pneumonia and acute CHF exacerbation in 12/2022. Had a recurrent admission in 04/2023 for acute metabolic encephalopathy in the setting of a UTI. Most recent admission was in 05/2023 for hypothermia and hypotension in the setting of C. difficile colitis. Cardiology was consulted during that admission as he was diagnosed with new-onset atrial fibrillation with slow ventricular response. His heart rate had initially been in the 30's but improved into the 50's to 60's and PTA Coreg  was held. While his CHA2DS2-VASc score was elevated at 4, he was not felt to be a good candidate for long-term anticoagulation given his advanced age, anemia, MGUS and thrombocytopenia. Coreg  was discontinued at discharge.  In talking with the patient today, history is limited given his dementia. He is A&O x 2 but says that he still lives with his daughter but currently resides at [facility]. His biggest complaint today is pain along his left great toe.  He denies any chest pain or shortness of breath. Says he is in a wheelchair a majority of the time but does use a walker to walk short distances. No specific orthopnea or PND. Does not complain about lower extremity edema but has 1+ pitting  edema on examination today.  Studies Reviewed:   EKG: EKG is ordered today and demonstrates:   EKG Interpretation Date/Time:  Thursday September 10 2023 11:04:47 EDT Ventricular Rate:  47 PR Interval:  194 QRS Duration:  100 QT Interval:  510 QTC Calculation: 451 R Axis:   46  Text Interpretation: Sinus bradycardia Left ventricular hypertrophy with repolarization abnormality ( Sokolow-Lyon ) Inferior infarct pattern Confirmed by Johnson Laymon ([id number]) on 09/10/2023 12:07:51 PM       Limited Echocardiogram: 04/2023 IMPRESSIONS     1. Left ventricular ejection fraction, by estimation, is 50 to 55%. Left  ventricular ejection fraction by 3D volume is 55 %. The left ventricle has  low normal function. The left ventricle has no regional wall motion  abnormalities. There is mild left  ventricular hypertrophy. Left ventricular diastolic function could not be  evaluated.   2. Right ventricular systolic function is normal. The right ventricular  size is normal. There is moderately elevated pulmonary artery systolic  pressure. The estimated right ventricular systolic pressure is 52.2 mmHg.   3. Right atrial size was severely dilated.   4. The inferior vena cava is dilated in size with <50% respiratory  variability, suggesting right atrial pressure of 15 mmHg.   Comparison(s): No significant change from prior study.    Risk Assessment/Calculations:   CHA2DS2-VASc Score = 4  his indicates a 4.8% annual risk of stroke. The patient's score is based upon: CHF History: 0 HTN History: 1 Diabetes History: 0 Stroke History: 0 Vascular Disease History: 1 Age Score: 2 Gender Score: 0  Physical Exam:   VS:  BP 136/62 (BP Location: Right Arm, Cuff Size: Normal)   Pulse (!) 48   Ht 5' 8 (1.727 m)   SpO2 100%   BMI 26.40 kg/m    Wt Readings from Last 3 Encounters:  06/15/23 173 lb 9.6 oz (78.7 kg)  06/04/23 173 lb 15.1 oz (78.9 kg)  05/29/23 171 lb 4.8 oz (77.7 kg)      GEN: Pleasant, elderly male appearing in no acute distress. Sitting in wheelchair.  NECK: No JVD; No carotid bruits CARDIAC: Regular rhythm, bradycardiac rate. No murmurs, rubs, gallops RESPIRATORY:  Clear to auscultation without rales, wheezing or rhonchi  ABDOMEN: Appears non-distended. No obvious abdominal masses. EXTREMITIES: Left great toe at 90 degree angle to nail bed. Significant onychomycosis. 1+ pitting edema bilaterally. Distal pedal pulses are 2+ bilaterally.   Assessment and Plan:   1. PAF (paroxysmal atrial fibrillation) (HCC) - This was a new diagnosis for the patient during his admission in 05/2023 and he was not started on anticoagulation given his age, dementia, anemia and thrombocytopenia.  Would continue to defer at this time. - Denies any recent palpitations. He is in sinus rhythm by examination and EKG today. Would continue to avoid AV nodal blocking agents given his heart rate in the 40's to 50's.  2. Coronary artery disease involving native coronary artery of native heart without angina pectoris - He previously had a STEMI in 2004 with DES to the RCA. Echocardiogram in 04/2023 showed his EF was at the low end of normal at 50 to 55%. - His activity is limited but he denies any recent chest pain. - Continue current medical therapy with ASA 81 mg daily and Crestor  5 mg daily.  3. Pulmonary HTN (HCC) - PASP was elevated at 52.2 mmHg by limited echo in 04/2023 and RV function was normal. Advanced workup was not pursued given his age and dementia. - He denies any shortness of breath but is not active at baseline. Does have 1+ pitting edema on examination today and will restart Lasix  at 20 mg daily. Repeat BNP and BMET in 2 weeks.   4. Thrombocytopenia (HCC) - Most recent CBC in 06/2023 showed his hemoglobin was at 8.6 and platelet count at 89 K. No reports of active bleeding.  5. Onychomycosis of Left Great Toe - His left great toenail is in a 90 degree angle in  relation to the nailbed. I did call and speak with his nurse at [facility] so the provider there can examine this further today as well. Also placed an urgent referral back to podiatry for further assessment.  Signed, Laymon CHRISTELLA Qua, PA-C

## 2023-09-10 ENCOUNTER — Encounter: Payer: Self-pay | Admitting: Student

## 2023-09-10 ENCOUNTER — Ambulatory Visit: Attending: Student | Admitting: Student

## 2023-09-10 VITALS — BP 136/62 | HR 48 | Ht 68.0 in

## 2023-09-10 DIAGNOSIS — B351 Tinea unguium: Secondary | ICD-10-CM | POA: Diagnosis not present

## 2023-09-10 DIAGNOSIS — Z79899 Other long term (current) drug therapy: Secondary | ICD-10-CM | POA: Diagnosis not present

## 2023-09-10 DIAGNOSIS — I272 Pulmonary hypertension, unspecified: Secondary | ICD-10-CM

## 2023-09-10 DIAGNOSIS — E1122 Type 2 diabetes mellitus with diabetic chronic kidney disease: Secondary | ICD-10-CM | POA: Diagnosis not present

## 2023-09-10 DIAGNOSIS — I69891 Dysphagia following other cerebrovascular disease: Secondary | ICD-10-CM | POA: Diagnosis not present

## 2023-09-10 DIAGNOSIS — I480 Paroxysmal atrial fibrillation: Secondary | ICD-10-CM | POA: Diagnosis not present

## 2023-09-10 DIAGNOSIS — R278 Other lack of coordination: Secondary | ICD-10-CM | POA: Diagnosis not present

## 2023-09-10 DIAGNOSIS — I2510 Atherosclerotic heart disease of native coronary artery without angina pectoris: Secondary | ICD-10-CM

## 2023-09-10 DIAGNOSIS — D696 Thrombocytopenia, unspecified: Secondary | ICD-10-CM

## 2023-09-10 DIAGNOSIS — R1312 Dysphagia, oropharyngeal phase: Secondary | ICD-10-CM | POA: Diagnosis not present

## 2023-09-10 MED ORDER — FUROSEMIDE 20 MG PO TABS: 20.0000 mg | ORAL_TABLET | Freq: Every day | ORAL | 3 refills | Status: DC

## 2023-09-10 NOTE — Patient Instructions (Signed)
 Medication Instructions:   Take Lasix  20 mg Daily   *If you need a refill on your cardiac medications before your next appointment, please call your pharmacy*  Lab Work: Your physician recommends that you return for lab work in: 2 weeks ( BNP, BMET)   If you have labs (blood work) drawn today and your tests are completely normal, you will receive your results only by: MyChart Message (if you have MyChart) OR A paper copy in the mail If you have any lab test that is abnormal or we need to change your treatment, we will call you to review the results.  Testing/Procedures: NONE   Follow-Up: At [facility], you and your health needs are our priority.  As part of our continuing mission to provide you with exceptional heart care, our providers are all part of one team.  This team includes your primary Cardiologist (physician) and Advanced Practice Providers or APPs (Physician Assistants and Nurse Practitioners) who all work together to provide you with the care you need, when you need it.  Your next appointment:   2 month(s)  Provider:   You may see Vishnu P Mallipeddi, MD or one of the following Advanced Practice Providers on your designated Care Team:   [place], PA-C  Sc[place]ia [place], [place] Olivia Pavy, [place]     We recommend signing up for the patient portal called MyChart.  Sign up information is provided on this After Visit Summary.  MyChart is used to connect with patients for Virtual Visits (Telemedicine).  Patients are able to view lab/test results, encounter notes, upcoming appointments, etc.  Non-urgent messages can be sent to your provider as well.   To learn more about what you can do with MyChart, go to [email or web address].   Other Instructions Thank you for choosing [facility]!

## 2023-09-11 DIAGNOSIS — E1122 Type 2 diabetes mellitus with diabetic chronic kidney disease: Secondary | ICD-10-CM | POA: Diagnosis not present

## 2023-09-11 DIAGNOSIS — R278 Other lack of coordination: Secondary | ICD-10-CM | POA: Diagnosis not present

## 2023-09-11 DIAGNOSIS — I69891 Dysphagia following other cerebrovascular disease: Secondary | ICD-10-CM | POA: Diagnosis not present

## 2023-09-14 DIAGNOSIS — I69891 Dysphagia following other cerebrovascular disease: Secondary | ICD-10-CM | POA: Diagnosis not present

## 2023-09-14 DIAGNOSIS — R278 Other lack of coordination: Secondary | ICD-10-CM | POA: Diagnosis not present

## 2023-09-14 DIAGNOSIS — E1122 Type 2 diabetes mellitus with diabetic chronic kidney disease: Secondary | ICD-10-CM | POA: Diagnosis not present

## 2023-09-15 DIAGNOSIS — I69891 Dysphagia following other cerebrovascular disease: Secondary | ICD-10-CM | POA: Diagnosis not present

## 2023-09-15 DIAGNOSIS — I130 Hypertensive heart and chronic kidney disease with heart failure and stage 1 through stage 4 chronic kidney disease, or unspecified chronic kidney disease: Secondary | ICD-10-CM | POA: Diagnosis not present

## 2023-09-15 DIAGNOSIS — R278 Other lack of coordination: Secondary | ICD-10-CM | POA: Diagnosis not present

## 2023-09-15 DIAGNOSIS — R1312 Dysphagia, oropharyngeal phase: Secondary | ICD-10-CM | POA: Diagnosis not present

## 2023-09-15 DIAGNOSIS — I5032 Chronic diastolic (congestive) heart failure: Secondary | ICD-10-CM | POA: Diagnosis not present

## 2023-09-15 DIAGNOSIS — E1122 Type 2 diabetes mellitus with diabetic chronic kidney disease: Secondary | ICD-10-CM | POA: Diagnosis not present

## 2023-09-15 DIAGNOSIS — N189 Chronic kidney disease, unspecified: Secondary | ICD-10-CM | POA: Diagnosis not present

## 2023-09-16 DIAGNOSIS — I5033 Acute on chronic diastolic (congestive) heart failure: Secondary | ICD-10-CM | POA: Diagnosis not present

## 2023-09-16 DIAGNOSIS — N138 Other obstructive and reflux uropathy: Secondary | ICD-10-CM | POA: Diagnosis not present

## 2023-09-16 DIAGNOSIS — E1122 Type 2 diabetes mellitus with diabetic chronic kidney disease: Secondary | ICD-10-CM | POA: Diagnosis not present

## 2023-09-16 DIAGNOSIS — I2510 Atherosclerotic heart disease of native coronary artery without angina pectoris: Secondary | ICD-10-CM | POA: Diagnosis not present

## 2023-09-17 DIAGNOSIS — E1122 Type 2 diabetes mellitus with diabetic chronic kidney disease: Secondary | ICD-10-CM | POA: Diagnosis not present

## 2023-09-17 DIAGNOSIS — R1312 Dysphagia, oropharyngeal phase: Secondary | ICD-10-CM | POA: Diagnosis not present

## 2023-09-17 DIAGNOSIS — I69891 Dysphagia following other cerebrovascular disease: Secondary | ICD-10-CM | POA: Diagnosis not present

## 2023-09-17 DIAGNOSIS — R278 Other lack of coordination: Secondary | ICD-10-CM | POA: Diagnosis not present

## 2023-09-18 DIAGNOSIS — I2510 Atherosclerotic heart disease of native coronary artery without angina pectoris: Secondary | ICD-10-CM | POA: Diagnosis not present

## 2023-09-18 DIAGNOSIS — E1122 Type 2 diabetes mellitus with diabetic chronic kidney disease: Secondary | ICD-10-CM | POA: Diagnosis not present

## 2023-09-18 DIAGNOSIS — N1832 Chronic kidney disease, stage 3b: Secondary | ICD-10-CM | POA: Diagnosis not present

## 2023-09-18 DIAGNOSIS — I700 Atherosclerosis of aorta: Secondary | ICD-10-CM | POA: Diagnosis not present

## 2023-09-19 DIAGNOSIS — E119 Type 2 diabetes mellitus without complications: Secondary | ICD-10-CM | POA: Diagnosis not present

## 2023-09-19 DIAGNOSIS — E1122 Type 2 diabetes mellitus with diabetic chronic kidney disease: Secondary | ICD-10-CM | POA: Diagnosis not present

## 2023-09-19 DIAGNOSIS — I2510 Atherosclerotic heart disease of native coronary artery without angina pectoris: Secondary | ICD-10-CM | POA: Diagnosis not present

## 2023-09-19 DIAGNOSIS — E559 Vitamin D deficiency, unspecified: Secondary | ICD-10-CM | POA: Diagnosis not present

## 2023-09-21 DIAGNOSIS — I700 Atherosclerosis of aorta: Secondary | ICD-10-CM | POA: Diagnosis not present

## 2023-09-21 DIAGNOSIS — E1122 Type 2 diabetes mellitus with diabetic chronic kidney disease: Secondary | ICD-10-CM | POA: Diagnosis not present

## 2023-09-21 DIAGNOSIS — N1832 Chronic kidney disease, stage 3b: Secondary | ICD-10-CM | POA: Diagnosis not present

## 2023-09-21 DIAGNOSIS — I2510 Atherosclerotic heart disease of native coronary artery without angina pectoris: Secondary | ICD-10-CM | POA: Diagnosis not present

## 2023-09-24 ENCOUNTER — Ambulatory Visit (INDEPENDENT_AMBULATORY_CARE_PROVIDER_SITE_OTHER)

## 2023-09-24 VITALS — Ht 68.0 in | Wt 173.0 lb

## 2023-09-24 DIAGNOSIS — B351 Tinea unguium: Secondary | ICD-10-CM

## 2023-09-24 DIAGNOSIS — E1169 Type 2 diabetes mellitus with other specified complication: Secondary | ICD-10-CM

## 2023-09-24 DIAGNOSIS — I2510 Atherosclerotic heart disease of native coronary artery without angina pectoris: Secondary | ICD-10-CM | POA: Diagnosis not present

## 2023-09-24 DIAGNOSIS — I509 Heart failure, unspecified: Secondary | ICD-10-CM | POA: Diagnosis not present

## 2023-09-24 DIAGNOSIS — N1832 Chronic kidney disease, stage 3b: Secondary | ICD-10-CM

## 2023-09-24 DIAGNOSIS — M79675 Pain in left toe(s): Secondary | ICD-10-CM

## 2023-09-24 DIAGNOSIS — E785 Hyperlipidemia, unspecified: Secondary | ICD-10-CM

## 2023-09-24 DIAGNOSIS — M79674 Pain in right toe(s): Secondary | ICD-10-CM | POA: Diagnosis not present

## 2023-09-24 NOTE — Progress Notes (Signed)
 This patient returns to my office for at risk foot care.  This patient requires this care by a professional since this patient will be at risk due to having kidney disease.  This patient is unable to cut nails himself since the patient cannot reach his nails.These nails are painful walking and wearing shoes.  This patient presents for at risk foot care today.  General Appearance  Alert, conversant and in no acute stress.  Vascular  Dorsalis pedis and posterior tibial  pulses are  not palpable  due to swelling. bilaterally.  Capillary return is within normal limits  bilaterally. Temperature is within normal limits  bilaterally.  Neurologic  Senn-Weinstein monofilament wire test within normal limits  bilaterally. Muscle power within normal limits bilaterally.  Nails Thick disfigured discolored nails with subungual debris  from hallux to fifth toes bilaterally. Left hallux toenail absent with early signs of new nail at proximal nail fold. No signs of infection. Right 5th toenail was loose and not adhered to underlying nail bed today and was removed without incident. No evidence of bacterial infection or drainage bilaterally.  Orthopedic  No limitations of motion  feet .  No crepitus or effusions noted.  No bony pathology or digital deformities noted.  Skin  normotropic skin with no porokeratosis noted bilaterally.  No signs of infections or ulcers noted.     Pain due to onychomycosis of toenails of both feet  Stage 3b chronic kidney disease (HCC)  Type 2 diabetes mellitus with hyperlipidemia (HCC)   Consent was obtained for treatment procedures.   Mechanical debridement of nails 1-5  bilaterally performed with a nail nipper.  Filed with dremel without incident. Right 5th toe nail plate was dressed with abx ointment and bandaid. Recommend applyting abx ointment (neosporin, triple abx ointment, etc..) once daily and cover with bandaid. Monitor for any signs of infection or gangrenous changes.     Return office visit    4 months                  Told patient to return for periodic foot care and evaluation due to potential at risk complications.   Prentice Ovens, DPM

## 2023-10-02 DIAGNOSIS — M6281 Muscle weakness (generalized): Secondary | ICD-10-CM | POA: Diagnosis not present

## 2023-10-02 DIAGNOSIS — J811 Chronic pulmonary edema: Secondary | ICD-10-CM | POA: Diagnosis not present

## 2023-10-02 DIAGNOSIS — I5022 Chronic systolic (congestive) heart failure: Secondary | ICD-10-CM | POA: Diagnosis not present

## 2023-10-02 DIAGNOSIS — R059 Cough, unspecified: Secondary | ICD-10-CM | POA: Diagnosis not present

## 2023-10-02 DIAGNOSIS — R051 Acute cough: Secondary | ICD-10-CM | POA: Diagnosis not present

## 2023-10-02 DIAGNOSIS — I5033 Acute on chronic diastolic (congestive) heart failure: Secondary | ICD-10-CM | POA: Diagnosis not present

## 2023-10-02 DIAGNOSIS — R0989 Other specified symptoms and signs involving the circulatory and respiratory systems: Secondary | ICD-10-CM | POA: Diagnosis not present

## 2023-10-05 DIAGNOSIS — I69891 Dysphagia following other cerebrovascular disease: Secondary | ICD-10-CM | POA: Diagnosis not present

## 2023-10-05 DIAGNOSIS — J069 Acute upper respiratory infection, unspecified: Secondary | ICD-10-CM | POA: Diagnosis not present

## 2023-10-05 DIAGNOSIS — N179 Acute kidney failure, unspecified: Secondary | ICD-10-CM | POA: Diagnosis not present

## 2023-10-05 DIAGNOSIS — R1312 Dysphagia, oropharyngeal phase: Secondary | ICD-10-CM | POA: Diagnosis not present

## 2023-10-05 DIAGNOSIS — M6281 Muscle weakness (generalized): Secondary | ICD-10-CM | POA: Diagnosis not present

## 2023-10-05 DIAGNOSIS — I5033 Acute on chronic diastolic (congestive) heart failure: Secondary | ICD-10-CM | POA: Diagnosis not present

## 2023-10-05 DIAGNOSIS — E1122 Type 2 diabetes mellitus with diabetic chronic kidney disease: Secondary | ICD-10-CM | POA: Diagnosis not present

## 2023-10-05 DIAGNOSIS — R278 Other lack of coordination: Secondary | ICD-10-CM | POA: Diagnosis not present

## 2023-10-06 DIAGNOSIS — R278 Other lack of coordination: Secondary | ICD-10-CM | POA: Diagnosis not present

## 2023-10-06 DIAGNOSIS — E1122 Type 2 diabetes mellitus with diabetic chronic kidney disease: Secondary | ICD-10-CM | POA: Diagnosis not present

## 2023-10-06 DIAGNOSIS — I69891 Dysphagia following other cerebrovascular disease: Secondary | ICD-10-CM | POA: Diagnosis not present

## 2023-10-08 IMAGING — US US SCROTUM W/ DOPPLER COMPLETE
1 series · 13 of 25 positions shown · non-contrast
Comparison: 04/17/2017

CLINICAL DATA: Hydrocele

EXAM:
SCROTAL ULTRASOUND
DOPPLER ULTRASOUND OF THE TESTICLES
TECHNIQUE: Complete ultrasound examination of the testicles, epididymis, and
other scrotal structures was performed. Color and spectral Doppler
ultrasound were also utilized to evaluate blood flow to the
testicles.

[Series 1: us scrotum w/doppler · 13 of 42 slices shown]
[im 1/42]
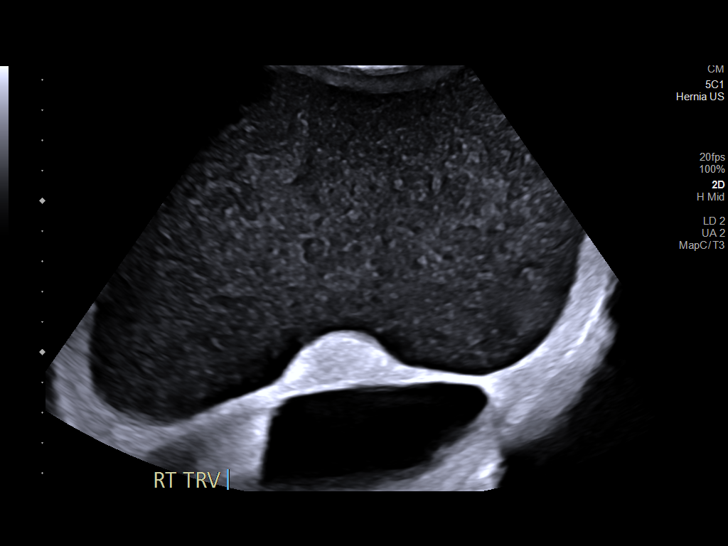
[im 4/42]
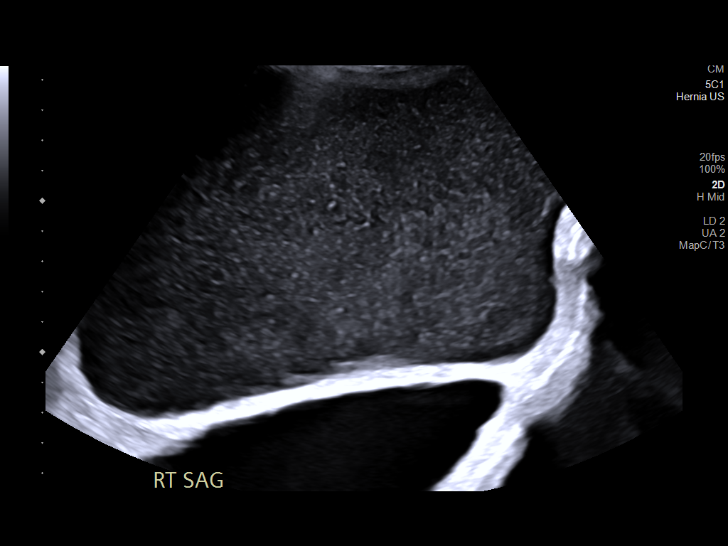
[im 7/42]
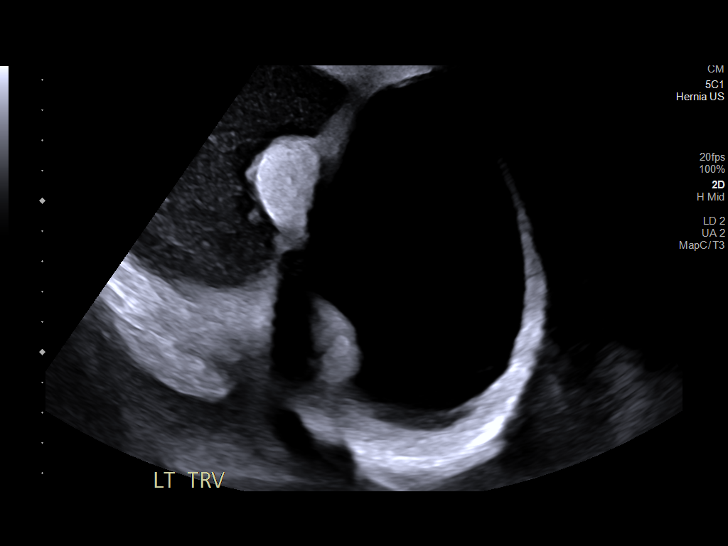
[im 11/42]
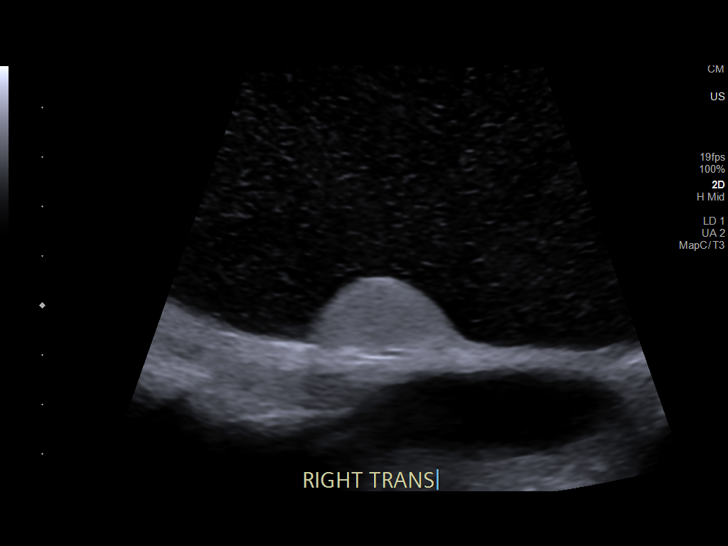
[im 14/42]
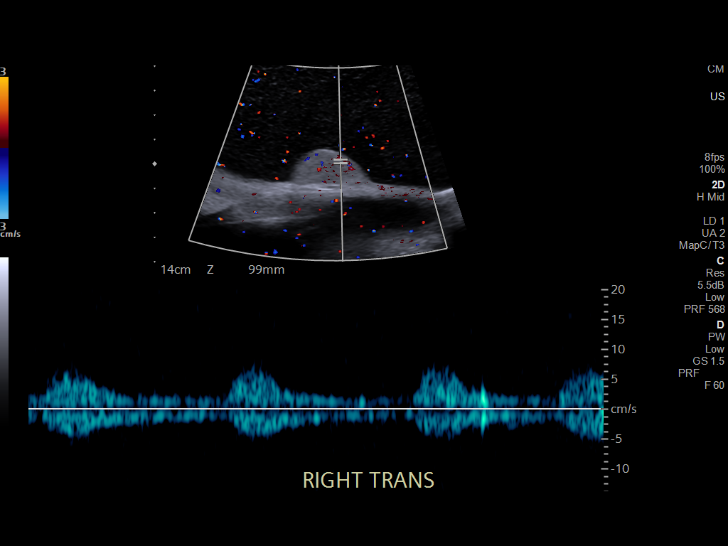
[im 18/42]
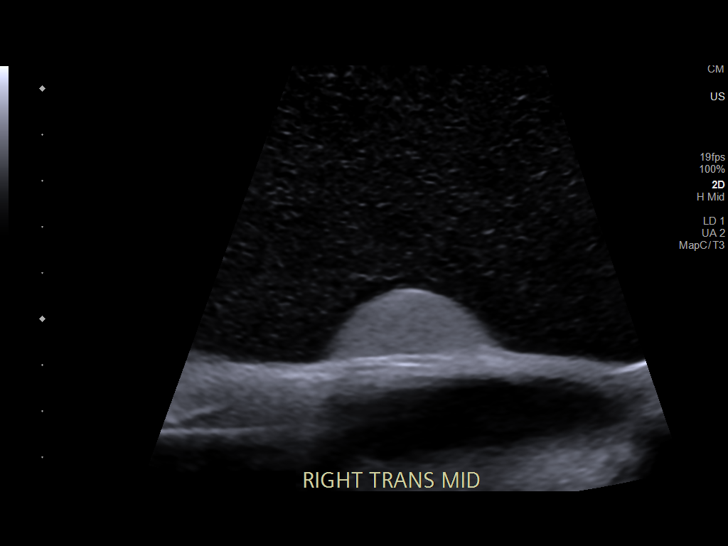
[im 21/42]
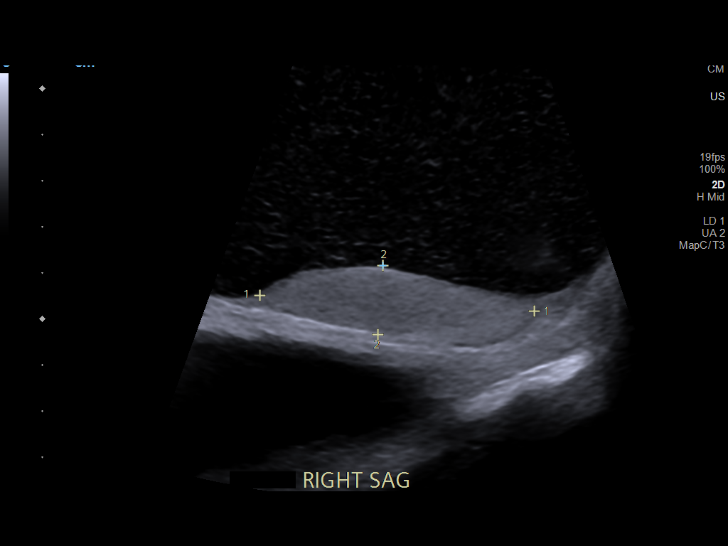
[im 24/42]
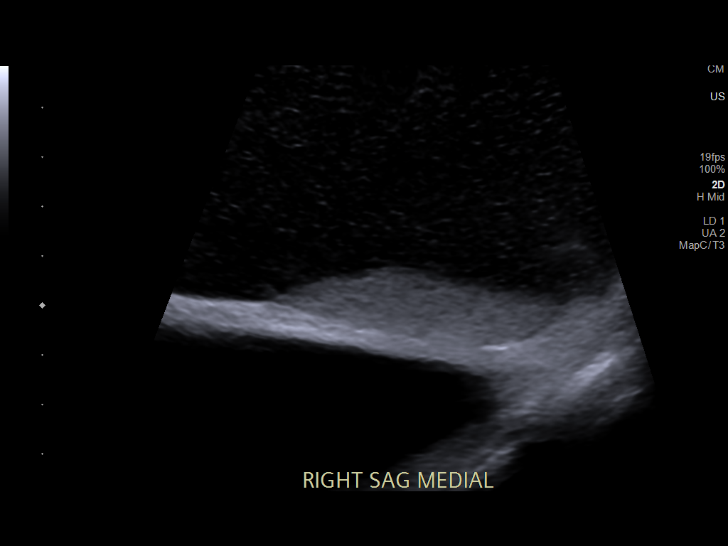
[im 28/42]
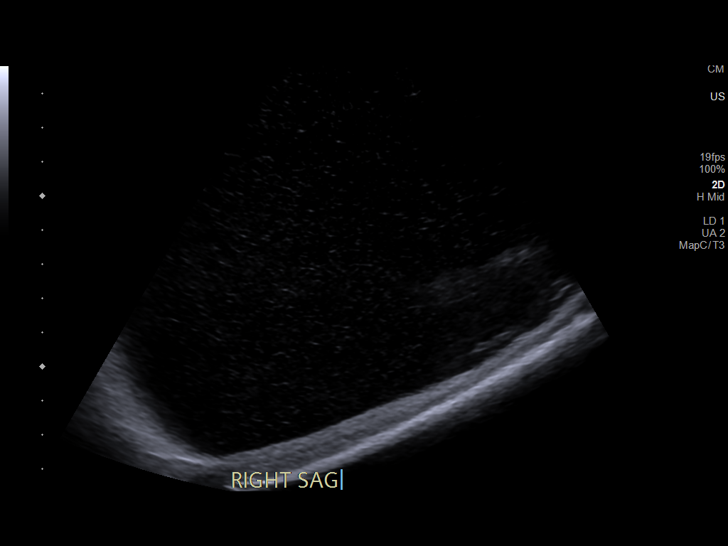
[im 31/42]
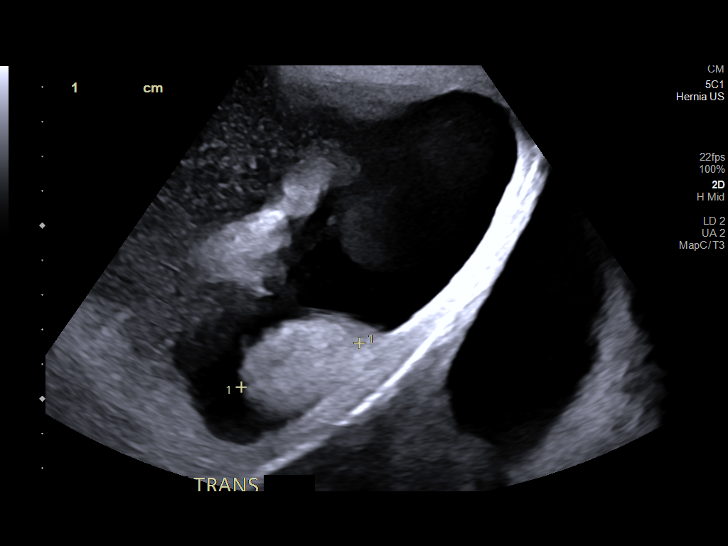
[im 35/42]
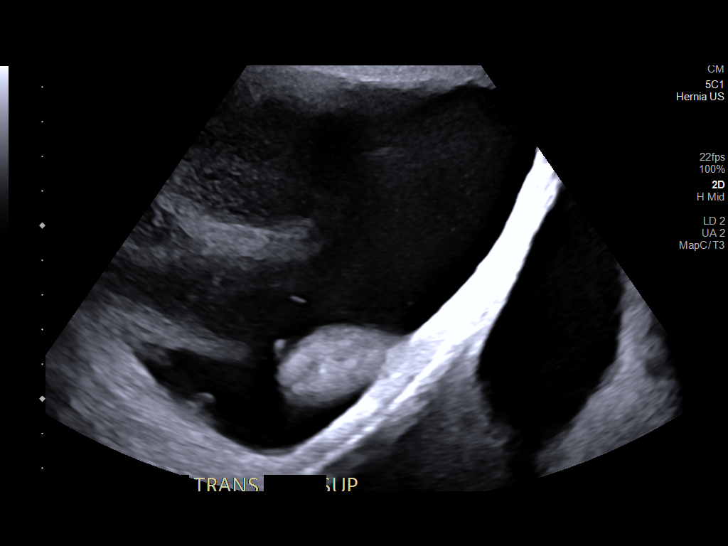
[im 38/42]
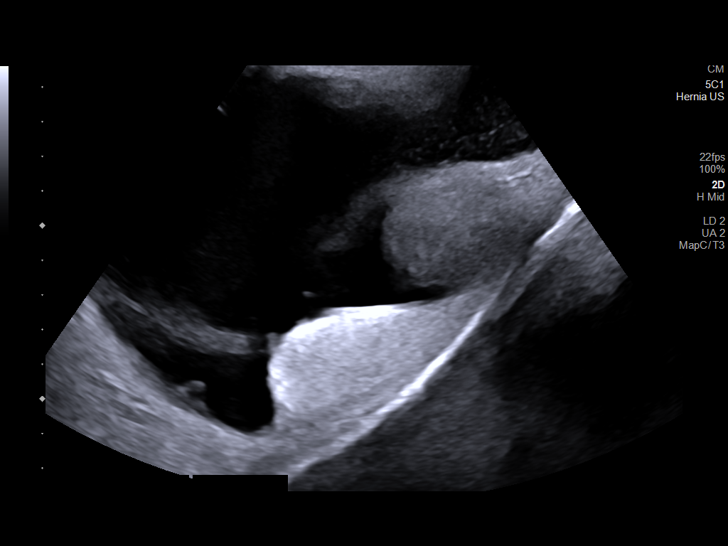
[im 42/42]
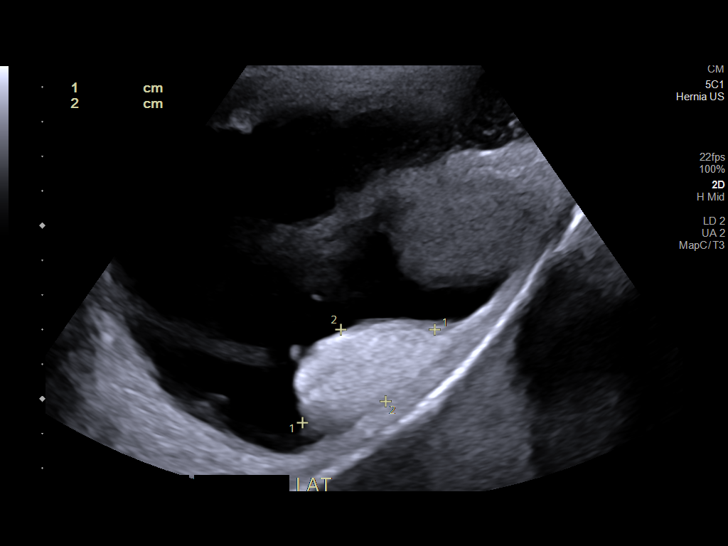

[13 of 25 positions shown; findings below may reference images not displayed]

FINDINGS: Right testicle

Measurements: 6.0 x 1.5 x 2.7 cm. Compressed by hydrocele. Normal
echogenicity. No mass or calcification. Internal blood flow present
on color Doppler imaging.

Left testicle

Measurements: 4.7 x 2.4 x 3.6 cm. Compressed by hydrocele. Normal
echogenicity. No mass or calcification. Internal blood flow present
on color Doppler imaging.

Right epididymis: Not clearly identified, likely due to compression
by hydrocele

Left epididymis: Not clearly identified, likely due to compression
by hydrocele

Hydrocele: Very large BILATERAL hydroceles. RIGHT hydrocele is
complicated by significant floating debris.

Varicocele: Unable to adequately assess due to marked compression of
structures in the scrotum by the very large hydroceles.

Pulsed Doppler interrogation of both testes demonstrates normal low
resistance arterial and venous waveforms bilaterally.
IMPRESSION: Very large BILATERAL hydroceles compressing the testes and obscuring
the epididymi.

No focal mass or hernia identified.

## 2023-10-09 DIAGNOSIS — J069 Acute upper respiratory infection, unspecified: Secondary | ICD-10-CM | POA: Diagnosis not present

## 2023-10-09 DIAGNOSIS — R052 Subacute cough: Secondary | ICD-10-CM | POA: Diagnosis not present

## 2023-10-09 DIAGNOSIS — I5033 Acute on chronic diastolic (congestive) heart failure: Secondary | ICD-10-CM | POA: Diagnosis not present

## 2023-10-12 DIAGNOSIS — R7989 Other specified abnormal findings of blood chemistry: Secondary | ICD-10-CM | POA: Diagnosis not present

## 2023-10-13 DIAGNOSIS — E860 Dehydration: Secondary | ICD-10-CM | POA: Diagnosis not present

## 2023-10-13 DIAGNOSIS — I5033 Acute on chronic diastolic (congestive) heart failure: Secondary | ICD-10-CM | POA: Diagnosis not present

## 2023-10-13 DIAGNOSIS — N179 Acute kidney failure, unspecified: Secondary | ICD-10-CM | POA: Diagnosis not present

## 2023-10-13 DIAGNOSIS — N1832 Chronic kidney disease, stage 3b: Secondary | ICD-10-CM | POA: Diagnosis not present

## 2023-10-14 DIAGNOSIS — K5909 Other constipation: Secondary | ICD-10-CM | POA: Diagnosis not present

## 2023-10-14 DIAGNOSIS — I700 Atherosclerosis of aorta: Secondary | ICD-10-CM | POA: Diagnosis not present

## 2023-10-14 DIAGNOSIS — E1122 Type 2 diabetes mellitus with diabetic chronic kidney disease: Secondary | ICD-10-CM | POA: Diagnosis not present

## 2023-10-14 DIAGNOSIS — I5033 Acute on chronic diastolic (congestive) heart failure: Secondary | ICD-10-CM | POA: Diagnosis not present

## 2023-10-15 DIAGNOSIS — I5033 Acute on chronic diastolic (congestive) heart failure: Secondary | ICD-10-CM | POA: Diagnosis not present

## 2023-10-15 DIAGNOSIS — N179 Acute kidney failure, unspecified: Secondary | ICD-10-CM | POA: Diagnosis not present

## 2023-10-15 DIAGNOSIS — I509 Heart failure, unspecified: Secondary | ICD-10-CM | POA: Diagnosis not present

## 2023-10-15 DIAGNOSIS — N1832 Chronic kidney disease, stage 3b: Secondary | ICD-10-CM | POA: Diagnosis not present

## 2023-10-15 DIAGNOSIS — E860 Dehydration: Secondary | ICD-10-CM | POA: Diagnosis not present

## 2023-10-16 DIAGNOSIS — I130 Hypertensive heart and chronic kidney disease with heart failure and stage 1 through stage 4 chronic kidney disease, or unspecified chronic kidney disease: Secondary | ICD-10-CM | POA: Diagnosis not present

## 2023-10-16 DIAGNOSIS — I5022 Chronic systolic (congestive) heart failure: Secondary | ICD-10-CM | POA: Diagnosis not present

## 2023-10-16 DIAGNOSIS — N179 Acute kidney failure, unspecified: Secondary | ICD-10-CM | POA: Diagnosis not present

## 2023-10-16 DIAGNOSIS — I5033 Acute on chronic diastolic (congestive) heart failure: Secondary | ICD-10-CM | POA: Diagnosis not present

## 2023-10-23 DIAGNOSIS — I5033 Acute on chronic diastolic (congestive) heart failure: Secondary | ICD-10-CM | POA: Diagnosis not present

## 2023-10-23 DIAGNOSIS — N1832 Chronic kidney disease, stage 3b: Secondary | ICD-10-CM | POA: Diagnosis not present

## 2023-10-23 DIAGNOSIS — N179 Acute kidney failure, unspecified: Secondary | ICD-10-CM | POA: Diagnosis not present

## 2023-10-23 DIAGNOSIS — I130 Hypertensive heart and chronic kidney disease with heart failure and stage 1 through stage 4 chronic kidney disease, or unspecified chronic kidney disease: Secondary | ICD-10-CM | POA: Diagnosis not present

## 2023-11-11 ENCOUNTER — Encounter (INDEPENDENT_AMBULATORY_CARE_PROVIDER_SITE_OTHER): Payer: Self-pay | Admitting: Gastroenterology

## 2023-11-18 ENCOUNTER — Ambulatory Visit: Attending: Student | Admitting: Student

## 2023-11-18 ENCOUNTER — Encounter: Payer: Self-pay | Admitting: Student

## 2023-11-18 VITALS — BP 134/68 | HR 43 | Ht 68.0 in | Wt 175.6 lb

## 2023-11-18 DIAGNOSIS — D696 Thrombocytopenia, unspecified: Secondary | ICD-10-CM | POA: Diagnosis not present

## 2023-11-18 DIAGNOSIS — I272 Pulmonary hypertension, unspecified: Secondary | ICD-10-CM

## 2023-11-18 DIAGNOSIS — I480 Paroxysmal atrial fibrillation: Secondary | ICD-10-CM | POA: Diagnosis not present

## 2023-11-18 DIAGNOSIS — I2510 Atherosclerotic heart disease of native coronary artery without angina pectoris: Secondary | ICD-10-CM | POA: Diagnosis not present

## 2023-11-18 NOTE — Patient Instructions (Signed)
 Medication Instructions:   Continue current medication regimen.   *If you need a refill on your cardiac medications before your next appointment, please call your pharmacy*  Lab Work:  Please fax a copy of most recent labs to [phone]  Follow-Up: At [facility], you and your health needs are our priority.  As part of our continuing mission to provide you with exceptional heart care, our providers are all part of one team.  This team includes your primary Cardiologist (physician) and Advanced Practice Providers or APPs (Physician Assistants and Nurse Practitioners) who all work together to provide you with the care you need, when you need it.  Your next appointment:   6 month(s)  Provider:   You may see Vishnu P Mallipeddi, MD or one of the following Advanced Practice Providers on your designated Care Team:   Marjean Imperato, PA-C  Sc[place]ia [place], [place] Olivia Pavy, [place]     We recommend signing up for the patient portal called MyChart.  Sign up information is provided on this After Visit Summary.  MyChart is used to connect with patients for Virtual Visits (Telemedicine).  Patients are able to view lab/test results, encounter notes, upcoming appointments, etc.  Non-urgent messages can be sent to your provider as well.   To learn more about what you can do with MyChart, go to [email or web address].

## 2023-11-18 NOTE — Progress Notes (Signed)
 Cardiology Office Note    Date:  11/18/2023  ID:  Terrance Miller, DOB May 23, 1928, MRN [id number] Cardiologist: Diannah SHAUNNA Maywood, MD Cardiology APP:  Johnson Laymon HERO, PA-C { :  History of Present Illness:    Terrance Miller is a 89 y.o. male with past medical history of CAD (s/p STEMI in 2004 with DES to RCA), paroxysmal atrial fibrillation (diagnosed during admission 05/2023), HTN, HLD, pulmonary hypertension and dementia who presents to the office today for 75-month follow-up.  He was last examined by myself in 09/2023 and denied any recent chest pain or dyspnea on exertion at that time but was wheelchair-bound and only using his walker for short distances. He did have 1+ pitting edema on examination and was started on Lasix  20 mg daily with plans for follow-up BNP and BMP in 2 weeks. In regards to his atrial fibrillation, he had not been on anticoagulation given his age, dementia, anemia and thrombocytopenia.  In talking with the patient today, history is limited given his dementia. He is aware of his date of birth but is unable to tell me what month or year it is. He says he is typically in the wheelchair throughout the day but does walk short distances with a walker. He denies any chest pain or dyspnea on exertion.  No reports of orthopnea or PND. He is unsure if he has lower extremity edema but noted on examination today.   Studies Reviewed:   EKG: EKG is not ordered today. EKG from 09/10/2023 is reviewed and shows sinus bradycardia, heart rate 47 with LVH and associated repolarization abnormalities.  Echocardiogram: 12/2022 IMPRESSIONS     1. Left ventricular ejection fraction, by estimation, is 50%. The left  ventricle has mildly decreased function. Left ventricular diastolic  parameters are indeterminate.   2. Right ventricular systolic function is mildly reduced. The right  ventricular size is mildly enlarged.   3. Trivial mitral valve regurgitation.   4. The aortic valve is  tricuspid. Aortic valve regurgitation is not  visualized. Aortic valve sclerosis/calcification is present, without any  evidence of aortic stenosis.   5. The inferior vena cava is dilated in size with <50% respiratory  variability, suggesting right atrial pressure of 15 mmHg.    Limited Echocardiogram: 04/2023 IMPRESSIONS     1. Left ventricular ejection fraction, by estimation, is 50 to 55%. Left  ventricular ejection fraction by 3D volume is 55 %. The left ventricle has  low normal function. The left ventricle has no regional wall motion  abnormalities. There is mild left  ventricular hypertrophy. Left ventricular diastolic function could not be  evaluated.   2. Right ventricular systolic function is normal. The right ventricular  size is normal. There is moderately elevated pulmonary artery systolic  pressure. The estimated right ventricular systolic pressure is 52.2 mmHg.   3. Right atrial size was severely dilated.   4. The inferior vena cava is dilated in size with <50% respiratory  variability, suggesting right atrial pressure of 15 mmHg.   Comparison(s): No significant change from prior study.    Physical Exam:   VS:  BP 134/68   Pulse (!) 43   Ht 5' 8 (1.727 m)   Wt 175 lb 9.6 oz (79.7 kg)   SpO2 100%   BMI 26.70 kg/m    Wt Readings from Last 3 Encounters:  11/18/23 175 lb 9.6 oz (79.7 kg)  09/24/23 173 lb (78.5 kg)  06/15/23 173 lb 9.6 oz (78.7 kg)  GEN: Pleasant, elderly male appearing in no acute distress. Sitting in wheelchair.  NECK: No JVD; No carotid bruits CARDIAC: Regular rhythm, bradycardiac rate, no murmurs, rubs, gallops RESPIRATORY:  Clear to auscultation without rales, wheezing or rhonchi  ABDOMEN: Appears non-distended. No obvious abdominal masses. EXTREMITIES: No clubbing or cyanosis. Trace lower extremity edema bilaterally.  Distal pedal pulses are 2+ bilaterally.   Assessment and Plan:   1. PAF (paroxysmal atrial fibrillation)  (HCC) - Was diagnosed during his admission in 05/2023 and he was not started on anticoagulation given his age, dementia, anemia and thrombocytopenia. He denies any recent palpitations and is in sinus bradycardia by examination today. Continue to avoid AV nodal blocking agents given his baseline heart rate in the 40's to 50's.  2. Coronary artery disease involving native coronary artery of native heart without angina pectoris - He previously had a STEMI in 2004 with DES to the RCA. Most recent echocardiogram showed his EF was low-normal at 50 to 55% with mild LVH and no regional wall motion abnormalities. - He denies any recent anginal symptoms. Continue current medical therapy with Crestor  5 mg daily and ASA 81 mg daily.  3. Pulmonary HTN (HCC) - Most recent echocardiogram in 04/2023 showed normal RV function but he did have moderately elevated PASP at 52.2 mmHg.  Advanced workup has not been pursued given his age and dementia. - He denies any recent respiratory issues and only has trace lower extremities on examination today. Remains on Lasix  40 mg daily. Will request a copy of most recent labs from his SNF to make sure his kidney function and electrolytes have been checked recently as dosing was adjusted since his last visit.  4. Thrombocytopenia - Most recent CBC on file is from 06/2023 and hemoglobin was at 8.6 with platelets at 89K. Will request a copy of most recent labs from his SNF.   Signed, Laymon CHRISTELLA Qua, PA-C

## 2023-12-08 ENCOUNTER — Other Ambulatory Visit: Payer: Self-pay

## 2023-12-08 ENCOUNTER — Emergency Department (HOSPITAL_COMMUNITY)

## 2023-12-08 ENCOUNTER — Encounter (HOSPITAL_COMMUNITY): Payer: Self-pay | Admitting: Emergency Medicine

## 2023-12-08 ENCOUNTER — Inpatient Hospital Stay (HOSPITAL_COMMUNITY)
Admission: EM | Admit: 2023-12-08 | Discharge: 2024-01-04 | DRG: 871 | Disposition: A | Discharge status: Skilled Nursing Facility | Attending: Family Medicine | Admitting: Family Medicine

## 2023-12-08 DIAGNOSIS — N185 Chronic kidney disease, stage 5: Secondary | ICD-10-CM | POA: Diagnosis present

## 2023-12-08 DIAGNOSIS — I959 Hypotension, unspecified: Principal | ICD-10-CM

## 2023-12-08 DIAGNOSIS — R7401 Elevation of levels of liver transaminase levels: Secondary | ICD-10-CM | POA: Diagnosis not present

## 2023-12-08 DIAGNOSIS — R627 Adult failure to thrive: Secondary | ICD-10-CM | POA: Diagnosis present

## 2023-12-08 DIAGNOSIS — J690 Pneumonitis due to inhalation of food and vomit: Secondary | ICD-10-CM | POA: Diagnosis not present

## 2023-12-08 DIAGNOSIS — R6521 Severe sepsis with septic shock: Secondary | ICD-10-CM | POA: Diagnosis present

## 2023-12-08 DIAGNOSIS — K117 Disturbances of salivary secretion: Secondary | ICD-10-CM | POA: Diagnosis present

## 2023-12-08 DIAGNOSIS — R633 Feeding difficulties, unspecified: Secondary | ICD-10-CM | POA: Diagnosis present

## 2023-12-08 DIAGNOSIS — R001 Bradycardia, unspecified: Secondary | ICD-10-CM | POA: Diagnosis not present

## 2023-12-08 DIAGNOSIS — J9811 Atelectasis: Secondary | ICD-10-CM | POA: Diagnosis present

## 2023-12-08 DIAGNOSIS — G9341 Metabolic encephalopathy: Secondary | ICD-10-CM | POA: Diagnosis present

## 2023-12-08 DIAGNOSIS — Z833 Family history of diabetes mellitus: Secondary | ICD-10-CM

## 2023-12-08 DIAGNOSIS — E876 Hypokalemia: Secondary | ICD-10-CM | POA: Diagnosis not present

## 2023-12-08 DIAGNOSIS — Z87891 Personal history of nicotine dependence: Secondary | ICD-10-CM

## 2023-12-08 DIAGNOSIS — E8729 Other acidosis: Secondary | ICD-10-CM

## 2023-12-08 DIAGNOSIS — E872 Acidosis, unspecified: Secondary | ICD-10-CM | POA: Diagnosis present

## 2023-12-08 DIAGNOSIS — R1310 Dysphagia, unspecified: Secondary | ICD-10-CM | POA: Diagnosis present

## 2023-12-08 DIAGNOSIS — Z789 Other specified health status: Secondary | ICD-10-CM | POA: Diagnosis not present

## 2023-12-08 DIAGNOSIS — I2510 Atherosclerotic heart disease of native coronary artery without angina pectoris: Secondary | ICD-10-CM | POA: Diagnosis present

## 2023-12-08 DIAGNOSIS — I252 Old myocardial infarction: Secondary | ICD-10-CM

## 2023-12-08 DIAGNOSIS — E782 Mixed hyperlipidemia: Secondary | ICD-10-CM | POA: Diagnosis present

## 2023-12-08 DIAGNOSIS — F03918 Unspecified dementia, unspecified severity, with other behavioral disturbance: Secondary | ICD-10-CM

## 2023-12-08 DIAGNOSIS — E870 Hyperosmolality and hypernatremia: Secondary | ICD-10-CM | POA: Diagnosis not present

## 2023-12-08 DIAGNOSIS — D649 Anemia, unspecified: Secondary | ICD-10-CM | POA: Diagnosis not present

## 2023-12-08 DIAGNOSIS — Z7189 Other specified counseling: Secondary | ICD-10-CM | POA: Diagnosis not present

## 2023-12-08 DIAGNOSIS — N179 Acute kidney failure, unspecified: Secondary | ICD-10-CM | POA: Diagnosis present

## 2023-12-08 DIAGNOSIS — E11649 Type 2 diabetes mellitus with hypoglycemia without coma: Secondary | ICD-10-CM | POA: Diagnosis present

## 2023-12-08 DIAGNOSIS — I132 Hypertensive heart and chronic kidney disease with heart failure and with stage 5 chronic kidney disease, or end stage renal disease: Secondary | ICD-10-CM | POA: Diagnosis present

## 2023-12-08 DIAGNOSIS — N400 Enlarged prostate without lower urinary tract symptoms: Secondary | ICD-10-CM | POA: Diagnosis present

## 2023-12-08 DIAGNOSIS — D539 Nutritional anemia, unspecified: Secondary | ICD-10-CM | POA: Diagnosis present

## 2023-12-08 DIAGNOSIS — E871 Hypo-osmolality and hyponatremia: Secondary | ICD-10-CM | POA: Diagnosis not present

## 2023-12-08 DIAGNOSIS — I5032 Chronic diastolic (congestive) heart failure: Secondary | ICD-10-CM | POA: Diagnosis present

## 2023-12-08 DIAGNOSIS — I480 Paroxysmal atrial fibrillation: Secondary | ICD-10-CM | POA: Diagnosis present

## 2023-12-08 DIAGNOSIS — N390 Urinary tract infection, site not specified: Secondary | ICD-10-CM | POA: Diagnosis present

## 2023-12-08 DIAGNOSIS — E1122 Type 2 diabetes mellitus with diabetic chronic kidney disease: Secondary | ICD-10-CM | POA: Diagnosis present

## 2023-12-08 DIAGNOSIS — D696 Thrombocytopenia, unspecified: Secondary | ICD-10-CM | POA: Diagnosis present

## 2023-12-08 DIAGNOSIS — F0150 Vascular dementia without behavioral disturbance: Secondary | ICD-10-CM | POA: Diagnosis present

## 2023-12-08 DIAGNOSIS — Z888 Allergy status to other drugs, medicaments and biological substances status: Secondary | ICD-10-CM

## 2023-12-08 DIAGNOSIS — Z66 Do not resuscitate: Secondary | ICD-10-CM | POA: Diagnosis present

## 2023-12-08 DIAGNOSIS — Z79899 Other long term (current) drug therapy: Secondary | ICD-10-CM

## 2023-12-08 DIAGNOSIS — Z515 Encounter for palliative care: Secondary | ICD-10-CM | POA: Diagnosis not present

## 2023-12-08 DIAGNOSIS — Z8249 Family history of ischemic heart disease and other diseases of the circulatory system: Secondary | ICD-10-CM

## 2023-12-08 DIAGNOSIS — Z9842 Cataract extraction status, left eye: Secondary | ICD-10-CM

## 2023-12-08 DIAGNOSIS — Z9841 Cataract extraction status, right eye: Secondary | ICD-10-CM

## 2023-12-08 DIAGNOSIS — D472 Monoclonal gammopathy: Secondary | ICD-10-CM | POA: Diagnosis present

## 2023-12-08 DIAGNOSIS — Z955 Presence of coronary angioplasty implant and graft: Secondary | ICD-10-CM

## 2023-12-08 DIAGNOSIS — Z7982 Long term (current) use of aspirin: Secondary | ICD-10-CM

## 2023-12-08 DIAGNOSIS — A419 Sepsis, unspecified organism: Principal | ICD-10-CM | POA: Diagnosis present

## 2023-12-08 DIAGNOSIS — L309 Dermatitis, unspecified: Secondary | ICD-10-CM | POA: Diagnosis present

## 2023-12-08 DIAGNOSIS — E860 Dehydration: Secondary | ICD-10-CM | POA: Diagnosis present

## 2023-12-08 DIAGNOSIS — Z751 Person awaiting admission to adequate facility elsewhere: Secondary | ICD-10-CM

## 2023-12-08 DIAGNOSIS — I872 Venous insufficiency (chronic) (peripheral): Secondary | ICD-10-CM | POA: Diagnosis present

## 2023-12-08 LAB — LACTIC ACID, PLASMA
Lactic Acid, Venous: 3.3 mmol/L (ref 0.5–1.9)
Lactic Acid, Venous: 3.1 mmol/L (ref 0.5–1.9)
Lactic Acid, Venous: 2.2 mmol/L (ref 0.5–1.9)
Lactic Acid, Venous: 2.1 mmol/L (ref 0.5–1.9)

## 2023-12-08 LAB — URINALYSIS, W/ REFLEX TO CULTURE (INFECTION SUSPECTED)
Bilirubin Urine: NEGATIVE
Glucose, UA: NEGATIVE mg/dL
Ketones, ur: NEGATIVE mg/dL
Nitrite: NEGATIVE
Protein, ur: 100 mg/dL — AB
RBC / HPF: >50 RBC/hpf (ref 0–5)
Specific Gravity, Urine: 1.017 (ref 1.005–1.030)
WBC, UA: >50 WBC/hpf (ref 0–5)
pH: 5 (ref 5.0–8.0)

## 2023-12-08 LAB — CBC WITH DIFFERENTIAL/PLATELET
Abs Immature Granulocytes: 0.01 K/uL (ref 0.00–0.07)
Basophils Absolute: 0 K/uL (ref 0.0–0.1)
Basophils Relative: 0 %
Eosinophils Absolute: 0 K/uL (ref 0.0–0.5)
Eosinophils Relative: 0 %
HCT: 21.6 % — ABNORMAL LOW (ref 39.0–52.0)
Hemoglobin: 7 g/dL — ABNORMAL LOW (ref 13.0–17.0)
Immature Granulocytes: 0 %
Lymphocytes Relative: 6 %
Lymphs Abs: 0.3 K/uL — ABNORMAL LOW (ref 0.7–4.0)
MCH: 33.7 pg (ref 26.0–34.0)
MCHC: 32.4 g/dL (ref 30.0–36.0)
MCV: 103.8 fL — ABNORMAL HIGH (ref 80.0–100.0)
Monocytes Absolute: 0.2 K/uL (ref 0.1–1.0)
Monocytes Relative: 3 %
Neutro Abs: 4.7 K/uL (ref 1.7–7.7)
Neutrophils Relative %: 91 %
Platelets: 14 K/uL — CL (ref 150–400)
RBC: 2.08 MIL/uL — ABNORMAL LOW (ref 4.22–5.81)
RDW: 23.9 % — ABNORMAL HIGH (ref 11.5–15.5)
WBC: 5.2 K/uL (ref 4.0–10.5)
nRBC: 2.5 % — ABNORMAL HIGH (ref 0.0–0.2)

## 2023-12-08 LAB — COMPREHENSIVE METABOLIC PANEL WITH GFR
ALT: 149 U/L — ABNORMAL HIGH (ref 0–44)
AST: 128 U/L — ABNORMAL HIGH (ref 15–41)
Albumin: 3.4 g/dL — ABNORMAL LOW (ref 3.5–5.0)
Alkaline Phosphatase: 151 U/L — ABNORMAL HIGH (ref 38–126)
Anion gap: 18 — ABNORMAL HIGH (ref 5–15)
BUN: 77 mg/dL — ABNORMAL HIGH (ref 8–23)
CO2: 14 mmol/L — ABNORMAL LOW (ref 22–32)
Calcium: 8.4 mg/dL — ABNORMAL LOW (ref 8.9–10.3)
Chloride: 117 mmol/L — ABNORMAL HIGH (ref 98–111)
Creatinine, Ser: 4.18 mg/dL — ABNORMAL HIGH (ref 0.61–1.24)
GFR, Estimated: 13 mL/min — ABNORMAL LOW (ref >60)
Glucose, Bld: 118 mg/dL — ABNORMAL HIGH (ref 70–99)
Potassium: 4.8 mmol/L (ref 3.5–5.1)
Sodium: 148 mmol/L — ABNORMAL HIGH (ref 135–145)
Total Bilirubin: 0.8 mg/dL (ref 0.0–1.2)
Total Protein: 6.3 g/dL — ABNORMAL LOW (ref 6.5–8.1)

## 2023-12-08 LAB — GLUCOSE, CAPILLARY
Glucose-Capillary: 113 mg/dL — ABNORMAL HIGH (ref 70–99)
Glucose-Capillary: 121 mg/dL — ABNORMAL HIGH (ref 70–99)
Glucose-Capillary: 121 mg/dL — ABNORMAL HIGH (ref 70–99)
Glucose-Capillary: 78 mg/dL (ref 70–99)
Glucose-Capillary: 78 mg/dL (ref 70–99)
Glucose-Capillary: 88 mg/dL (ref 70–99)

## 2023-12-08 LAB — VITAMIN B12: Vitamin B-12: 3929 pg/mL — ABNORMAL HIGH (ref 180–914)

## 2023-12-08 LAB — TSH: TSH: 3.07 u[IU]/mL (ref 0.350–4.500)

## 2023-12-08 LAB — CORTISOL: Cortisol, Plasma: 20.9 ug/dL

## 2023-12-08 LAB — MAGNESIUM: Magnesium: 2.8 mg/dL — ABNORMAL HIGH (ref 1.7–2.4)

## 2023-12-08 LAB — PHOSPHORUS: Phosphorus: 6.4 mg/dL — ABNORMAL HIGH (ref 2.5–4.6)

## 2023-12-08 LAB — FOLATE: Folate: >20 ng/mL (ref >5.9)

## 2023-12-08 LAB — AMMONIA: Ammonia: 30 umol/L (ref 9–35)

## 2023-12-08 LAB — MRSA NEXT GEN BY PCR, NASAL: MRSA by PCR Next Gen: DETECTED — AB

## 2023-12-08 MED ORDER — ONDANSETRON HCL 4 MG PO TABS: 4.0000 mg | ORAL_TABLET | Freq: Four times a day (QID) | ORAL | Status: DC | PRN

## 2023-12-08 MED ORDER — SODIUM CHLORIDE 0.9 % IV BOLUS
1000.0000 mL | Freq: Once | INTRAVENOUS | Status: AC
  Administered 2023-12-08: 1000 mL via INTRAVENOUS

## 2023-12-08 MED ORDER — ORAL CARE MOUTH RINSE
15.0000 mL | OROMUCOSAL | Status: DC | PRN
  Administered 2023-12-08: 15 mL via OROMUCOSAL

## 2023-12-08 MED ORDER — LACTATED RINGERS IV SOLN: INTRAVENOUS | Status: DC

## 2023-12-08 MED ORDER — SODIUM CHLORIDE 0.9 % IV SOLN
2.0000 g | INTRAVENOUS | Status: DC
  Administered 2023-12-08 – 2023-12-09 (×2): 2 g via INTRAVENOUS
  Filled 2023-12-08 (×2): qty 12.5

## 2023-12-08 MED ORDER — HYDROCORTISONE SOD SUC (PF) 100 MG IJ SOLR
100.0000 mg | Freq: Once | INTRAMUSCULAR | Status: AC
  Administered 2023-12-08: 100 mg via INTRAVENOUS
  Filled 2023-12-08: qty 2

## 2023-12-08 MED ORDER — SODIUM CHLORIDE 0.9 % IV SOLN
250.0000 mL | INTRAVENOUS | Status: DC
  Administered 2023-12-08: 250 mL via INTRAVENOUS

## 2023-12-08 MED ORDER — MUPIROCIN 2 % EX OINT
TOPICAL_OINTMENT | Freq: Two times a day (BID) | CUTANEOUS | Status: DC
  Administered 2023-12-13 – 2023-12-30 (×8): 1 via NASAL
  Filled 2023-12-08 (×5): qty 22

## 2023-12-08 MED ORDER — NOREPINEPHRINE 4 MG/250ML-% IV SOLN
0.0000 ug/min | INTRAVENOUS | Status: DC
  Administered 2023-12-08 (×2): 8 ug/min via INTRAVENOUS
  Administered 2023-12-08: 5 ug/min via INTRAVENOUS
  Administered 2023-12-09: 7 ug/min via INTRAVENOUS
  Administered 2023-12-09: 9 ug/min via INTRAVENOUS
  Administered 2023-12-09: 10 ug/min via INTRAVENOUS
  Administered 2023-12-10: 4 ug/min via INTRAVENOUS
  Administered 2023-12-10: 6 ug/min via INTRAVENOUS
  Filled 2023-12-08 (×8): qty 250

## 2023-12-08 MED ORDER — VANCOMYCIN HCL 1500 MG/300ML IV SOLN
1500.0000 mg | Freq: Once | INTRAVENOUS | Status: AC
  Administered 2023-12-08: 1500 mg via INTRAVENOUS
  Filled 2023-12-08: qty 300

## 2023-12-08 MED ORDER — STERILE WATER FOR INJECTION IV SOLN
INTRAVENOUS | Status: DC
  Filled 2023-12-08 (×9): qty 1000

## 2023-12-08 MED ORDER — DEXTROSE-SODIUM CHLORIDE 5-0.45 % IV SOLN: INTRAVENOUS | Status: AC

## 2023-12-08 MED ORDER — ONDANSETRON HCL 4 MG/2ML IJ SOLN: 4.0000 mg | Freq: Four times a day (QID) | INTRAMUSCULAR | Status: DC | PRN

## 2023-12-08 MED ORDER — SODIUM BICARBONATE 8.4 % IV SOLN
INTRAVENOUS | Status: AC
  Filled 2023-12-08: qty 150

## 2023-12-08 MED ORDER — ORAL CARE MOUTH RINSE
15.0000 mL | OROMUCOSAL | Status: DC
  Administered 2023-12-08 – 2024-01-04 (×102): 15 mL via OROMUCOSAL

## 2023-12-08 MED ORDER — ACETAMINOPHEN 325 MG PO TABS: 650.0000 mg | ORAL_TABLET | Freq: Four times a day (QID) | ORAL | Status: DC | PRN

## 2023-12-08 MED ORDER — VANCOMYCIN VARIABLE DOSE PER UNSTABLE RENAL FUNCTION (PHARMACIST DOSING): Status: DC

## 2023-12-08 MED ORDER — ACETAMINOPHEN 650 MG RE SUPP: 650.0000 mg | Freq: Four times a day (QID) | RECTAL | Status: DC | PRN

## 2023-12-08 MED ORDER — CHLORHEXIDINE GLUCONATE CLOTH 2 % EX PADS
6.0000 | MEDICATED_PAD | Freq: Every day | CUTANEOUS | Status: DC
  Administered 2023-12-08 – 2023-12-18 (×12): 6 via TOPICAL

## 2023-12-08 MED ORDER — SCOPOLAMINE 1 MG/3DAYS TD PT72
1.0000 | MEDICATED_PATCH | TRANSDERMAL | Status: AC
  Administered 2023-12-08 – 2023-12-17 (×4): 1 mg via TRANSDERMAL
  Filled 2023-12-08 (×4): qty 1

## 2023-12-08 MED ORDER — LACTATED RINGERS IV BOLUS
500.0000 mL | Freq: Once | INTRAVENOUS | Status: AC
  Administered 2023-12-08: 500 mL via INTRAVENOUS

## 2023-12-08 NOTE — ED Notes (Signed)
 CRITICAL VALUE STICKER  CRITICAL VALUE: Platelet - 14  RECEIVER (on-site recipient of call): Marolyn App, RN   DATE & TIME NOTIFIED: 12/08/2023 @ 0110   MESSENGER (representative from lab):  MD NOTIFIED: Delo, MD   TIME OF NOTIFICATION: 0112   RESPONSE:

## 2023-12-08 NOTE — ED Notes (Signed)
 Writer spoke with family at bedside about obtaining a urine specimen. Family requested no in/out cath or indwelling catheter be place. External male purewick placed

## 2023-12-08 NOTE — Progress Notes (Signed)
 Patient seen and examined; admitted after midnight secondary to septic shock in the setting of UTI most likely.  Patient has also complaining of acute metabolic encephalopathy.  Overall guarded prognosis.  Mews score around 6 and requiring peripheral IV pressure support at the moment.  Case has been discussed with patient's daughter at bedside who has confusion 110 IV antibiotics and peripheral pressors; no heroic/invasive interventions including central line placement or resuscitation. Please refer to H&P written by Dr. Manfred on 12/08/23 for further info/details.  Plan: -continue IV antibiotics -continue IVF's and supportive care -follow clinical response and palliative care consultation/rec's.   Eric Nunnery MD [phone]

## 2023-12-08 NOTE — H&P (Addendum)
 History and Physical    Patient: Terrance Miller FMW:[id number] DOB: 07-17-1928 DOA: 12/08/2023 DOS: the patient was seen and examined on 12/08/2023 PCP: [name], DO  Patient coming from: SNF  Chief Complaint:  Chief Complaint  Patient presents with   Hypotension   HPI: Terrance Miller is a 89 y.o. male with medical history significant of hypertension, hyperlipidemia, MGUS, CKD stage III, diet-controlled diabetes mellitus type 2, thrombocytopenia, dementia, HFpEF, coronary disease who presents to the emergency department via EMS due to altered mental status.  She was noted with altered mental status for the SNF yesterday in the evening with heart rate in 30s and patient was hypotensive with SBP in the 70s.  Patient was admitted from 5/28 to 6/2 and presented with similar presentation where he had hypothermia and hypotension as well as C. difficile colitis.  ED course In the emergency department, he was hypothermic with temperature 86.65F, respiratory rate 17, pulse 27 bpm, BP 67/40, O2 sats was 88% on 6 LPM.  Workup in the ED showed macrocytic anemia with MCV of 103.8, hemoglobin 7.0, platelets 14.  BMP shows sodium of 148, potassium 4.8, chloride 117, bicarb 14 blood glucose 118, BUN 77, creatinine 4.18 calcium  8.4, albumin 3.4, AST 128, ALT 149, ALP 151, eGFR 13, anion gap 18 Chest x-ray showed stable cardiomegaly with low lung volumes with mild bibasilar atelectasis Patient was started on IV vancomycin  and cefepime .  IV NS 2 L was provided in the ED Discussion was held with daughter at bedside who wanted to try peripheral IV pressors, but does not want any more invasive procedures and was willing to make patient comfort care if patient does not respond to pressors and antibiotics.  She objected to catheterizing patient to obtain urinalysis.    Review of Systems: As mentioned in the history of present illness. All other systems reviewed and are negative. Past Medical History:  Diagnosis  Date   Acute ST elevation myocardial infarction (STEMI) of inferior wall (HCC) 2004   Coronary artery disease    DES x2 to the RCA 2004, residual disease managed medically   Hyperlipidemia    Hypertension    Type 2 diabetes mellitus (HCC)    Past Surgical History:  Procedure Laterality Date   BIOPSY  01/28/2022   Procedure: BIOPSY;  Surgeon: Eartha Flavors, Toribio, MD;  Location: [facility];  Service: Gastroenterology;;   CATARACT EXTRACTION Bilateral    CORONARY ANGIOPLASTY WITH STENT PLACEMENT  09/05/2002   stent RCA, 80% first diagonal, 70-80% mid-diagonal, 80% mid LAD stenosis   FLEXIBLE SIGMOIDOSCOPY N/A 01/28/2022   Procedure: FLEXIBLE SIGMOIDOSCOPY;  Surgeon: Eartha Flavors Toribio, MD;  Location: [facility];  Service: Gastroenterology;  Laterality: N/A;   HYDROCELE EXCISION Bilateral 11/20/2021   Procedure: HYDROCELECTOMY ADULT;  Surgeon: Roseann Adine PARAS., MD;  Location: [facility];  Service: Urology;  Laterality: Bilateral;   IMPACTION REMOVAL  01/28/2022   Procedure: IMPACTION REMOVAL;  Surgeon: Eartha Flavors Toribio, MD;  Location: [facility];  Service: Gastroenterology;;   NM MYOCAR PERF WALL MOTION  11/01/2008   Normal   Social History:  reports that he quit smoking about 18 years ago. His smoking use included cigarettes. He has been exposed to tobacco smoke. He has never used smokeless tobacco. He reports that he does not currently use alcohol. He reports that he does not use drugs.  Allergies  Allergen Reactions   Clobetasol  Other (See Comments)    Unknown  No reaction listed on MAR from facility  Family History  Problem Relation Age of Onset   Hypertension Father    Diabetes Sister     Prior to Admission medications   Medication Sig Start Date End Date Taking? Authorizing Provider  acetaminophen  (TYLENOL ) 325 MG tablet Take 2 tablets (650 mg total) by mouth every 6 (six) hours as needed for mild pain (pain score 1-3) (or Fever >/= 101).  04/27/23   Emokpae, Courage, MD  albuterol  (PROVENTIL ) (2.5 MG/3ML) 0.083% nebulizer solution Take 2.5 mg by nebulization every 6 (six) hours as needed for wheezing or shortness of breath.    [provider]  amLODipine  (NORVASC ) 10 MG tablet Take 1 tablet (10 mg total) by mouth daily. 04/27/23 04/26/24  Pearlean Manus, MD  aspirin  EC 81 MG tablet Take 1 tablet (81 mg total) by mouth daily with breakfast. Swallow whole. 04/27/23   Pearlean Manus, MD  bisacodyl  (DULCOLAX) 10 MG suppository Place 1 suppository (10 mg total) rectally every Monday, Wednesday, and Friday. 01/31/22   Pearlean Manus, MD  cholecalciferol  (VITAMIN D ) 1000 units tablet Take 1,000 Units by mouth daily.    [provider]  Cholecalciferol  25 MCG (1000 UT) CHEW Chew by mouth daily at 6 (six) AM. 25 mcg daily    [provider]  cyanocobalamin  (VITAMIN B12) 1000 MCG tablet Take 1,000 mcg by mouth daily.    [provider]  docusate sodium  (COLACE) 100 MG capsule Take 100 mg by mouth 2 (two) times daily.    [provider]  feeding supplement (ENSURE PLUS HIGH PROTEIN) LIQD Take 237 mLs by mouth 2 (two) times daily between meals. 06/08/23   Evonnie Lenis, MD  ferrous sulfate 324 MG TBEC Take 324 mg by mouth.    [provider]  folic acid  (FOLVITE ) 1 MG tablet Take 1 tablet (1 mg total) by mouth daily. 03/17/23   Cook, [name], DO  furosemide  (LASIX ) 40 MG tablet Take 40 mg by mouth daily. 09/28/23   [provider]  latanoprost  (XALATAN ) 0.005 % ophthalmic solution Place 1 drop into both eyes 2 (two) times daily. 08/20/22   Landy Barnie RAMAN, NP  melatonin 3 MG TABS tablet Take 3 mg by mouth at bedtime.    [provider]  memantine  (NAMENDA ) 5 MG tablet Take 5 mg by mouth daily.    [provider]  polyethylene glycol (MIRALAX  / GLYCOLAX ) 17 g packet Take 17 g by mouth 2 (two) times daily. 01/31/22   Pearlean Manus, MD  rosuvastatin  (CRESTOR ) 5 MG tablet  Take 5 mg by mouth daily. Patient not taking: Reported on 11/18/2023    [provider]  senna (SENOKOT) 8.6 MG TABS tablet Take 2 tablets (17.2 mg total) by mouth at bedtime. 04/27/23   Pearlean Manus, MD  sertraline  (ZOLOFT ) 25 MG tablet Take 25 mg by mouth daily.    [provider]  tamsulosin  (FLOMAX ) 0.4 MG CAPS capsule Take 1 capsule (0.4 mg total) by mouth daily. 08/20/22   Landy Barnie RAMAN, NP  vancomycin  (VANCOCIN ) 50 mg/mL SOLN oral solution Take 2.5 mLs (125 mg total) by mouth 4 (four) times daily. X 7 days 06/08/23   Evonnie Lenis, MD    Physical Exam: Vitals:   12/08/23 0600 12/08/23 0605 12/08/23 0610 12/08/23 0615  BP: (!) 140/121 (!) 72/43 (!) 97/49 (!) 119/45  Pulse: (!) 50 (!) 48 (!) 57 (!) 58  Resp: 15 15 15 19   Temp:      TempSrc:      SpO2:  96% 98% 100% 100%  Weight:      Height:       General: Elderly male.  Ill appearing, somnolent, though opens eyes occasionally, but was not tracking.  Responds to tactile stimuli, but does not follow commands HEENT: NCAT.  Sclerae anicteric.  Dry mucosal membranes. Neck: Neck supple without lymphadenopathy. No carotid bruits. No masses palpated.  Cardiovascular: Bradycardia.  Normal S1-S2 sounds. No murmurs, rubs or gallops auscultated. No JVD.  Respiratory: Clear breath sounds.  No accessory muscle use. Abdomen: Soft, nontender, nondistended. Active bowel sounds. No masses or hepatosplenomegaly  Skin: No rashes, lesions, or ulcerations.  Dry, warm to touch. Musculoskeletal:  2+ dorsalis pedis and radial pulses.   Psychiatric: This cannot be assessed at this time due to patient's current condition Neurologic: No focal neurological deficits.Moves all 4 extremities.  Assessment and Plan: Septic shock  Patient was hypothermic, bradycardic No known source of infection at this time In April 2025, urine culture was positive for Enterobacter cloacae which was sensitive to cefepime , patient was unable to provide urine  and daughter refused In-N-Out catheterization to obtain urine Patient was empirically started on IV vancomycin  and cefepime  IV hydration per sepsis protocol was provided IV 100 mg of hydrocortisone  was given TSH and cortisol levels will be checked Bicarbonate drip was started Continue IV hydration Palliative care will be consulted Patient's daughter agreed to peripheral IV Levophed  with plan to change patient's status to comfort care if he does not respond to above management  Lactic acidosis secondary to above Lactic acid 3.3 > 3.1; continue gentle hydration and continue to trend lactic acid  High anion gap metabolic acidosis secondary to above (septic shock) Anion gap 18, continue management as described above for septic shock  Acute metabolic encephalopathy in the setting of septic shock Patient remains somnolent, but was arousable to tactile stimuli, though does not respond to commands Ammonia level will be checked Continue fall precaution, delirium precaution  Transaminasemia This is possibly due to shock liver Continue to monitor liver enzymes  Macrocytic anemia/MGUS There was concern for MDS vs myeloproliferative disorder Family has deferred bone marrow biopsy per medical record MCV 103.8, vitamin B12 and folate levels will be checked  Thrombocytopenia Platelets 14, no sign of bleeding inpatient Daughter does not want any platelet transfusion  Acute kidney injury superimposed on CKD 5 Dehydration Creatinine 4.8, baseline creatinine at 1.3-1.5 Continue gentle hydration Renally adjust medications, avoid nephrotoxic agents/dehydration/hypotension  Chronic HFpEF Clinically dehydrated Echo done on 04/22/2023 showed--EF 50-55%, no WMA, normal RVF  Mixed hyperlipidemia Statin will be held at this time due to elevated liver enzymes  Goals of care:  Daughter states that she wants patient to have a chance for peripheral IV pressors, but was against any other aggressive  intervention as she would not want to subject her father to any further discomfort.  She will be able to make patient comfort care if patient does not respond to above measures.    Advance Care Planning:   Code Status: Limited: Do not attempt resuscitation (DNR) -DNR-LIMITED -Do Not Intubate/DNI    Consults: Palliative care  Family Communication: Daughter at bedside (all questions answered to satisfaction)  Severity of Illness: The appropriate patient status for this patient is INPATIENT. Inpatient status is judged to be reasonable and necessary in order to provide the required intensity of service to ensure the patient's safety. The patient's presenting symptoms, physical exam findings, and initial radiographic and laboratory data in the context of their chronic comorbidities is  felt to place them at high risk for further clinical deterioration. Furthermore, it is not anticipated that the patient will be medically stable for discharge from the hospital within 2 midnights of admission.   * I certify that at the point of admission it is my clinical judgment that the patient will require inpatient hospital care spanning beyond 2 midnights from the point of admission due to high intensity of service, high risk for further deterioration and high frequency of surveillance required.*  Critical time: 72 minutes   Critical care personally provided  managing the patient due to high probability of clinically significant and life threatening deterioration. This critical care time included obtaining a history; examining the patient, pulse oximetry; ordering and review of studies; arranging urgent treatment with development of a management plan; evaluation of patient's response of treatment; frequent reassessment; and discussions with other providers.  This critical care time was performed to assess and manage the high probability of imminent and life threatening deterioration that could result in multi-organ  failure.   Author: Posey Maier, DO 12/08/2023 6:31 AM  For on call review [email or web address].

## 2023-12-08 NOTE — ED Notes (Signed)
 CRITICAL VALUE STICKER  CRITICAL VALUE: lactic acid 3.3  RECEIVER (on-site recipient of call): Marolyn App, RN    DATE & TIME NOTIFIED: 12/08/2023 @ 0150   MESSENGER (representative from lab):  MD NOTIFIED: Delo, MD   TIME OF NOTIFICATION: 0152   RESPONSE:

## 2023-12-08 NOTE — ED Triage Notes (Signed)
 Pt bib [facility] for hypotension, bradycardia, and hypoxia from [facility]. Facility states he was recently diagnosed with pneumonia.

## 2023-12-08 NOTE — Plan of Care (Signed)
 This patient remains on [facility] as of time of writing. The patient is minimally responsive (GCS = 6). The patient is receiving active infusions of levophed , bicarb, and D5+1/2NS via PIV's. The patient is requiring 6 L / min of supplemental O2 via HFNC. The patient is not clearing his own airway secretions and is requiring frequent suctioning. The patient remains generally hypothermic and remains on a warming blanket. Family remains at bedside.   Problem: Education: Goal: Knowledge of General Education information will improve Description: Including pain rating scale, medication(s)/side effects and non-pharmacologic comfort measures Outcome: Not Progressing   Problem: Health Behavior/Discharge Planning: Goal: Ability to manage health-related needs will improve Outcome: Not Progressing   Problem: Clinical Measurements: Goal: Ability to maintain clinical measurements within normal limits will improve Outcome: Not Progressing Goal: Will remain free from infection Outcome: Not Progressing Goal: Diagnostic test results will improve Outcome: Not Progressing Goal: Respiratory complications will improve Outcome: Not Progressing Goal: Cardiovascular complication will be avoided Outcome: Not Progressing   Problem: Activity: Goal: Risk for activity intolerance will decrease Outcome: Not Progressing   Problem: Nutrition: Goal: Adequate nutrition will be maintained Outcome: Not Progressing   Problem: Coping: Goal: Level of anxiety will decrease Outcome: Not Progressing   Problem: Elimination: Goal: Will not experience complications related to bowel motility Outcome: Not Progressing Goal: Will not experience complications related to urinary retention Outcome: Not Progressing   Problem: Pain Managment: Goal: General experience of comfort will improve and/or be controlled Outcome: Not Progressing   Problem: Safety: Goal: Ability to remain free from injury will improve Outcome: Not  Progressing   Problem: Skin Integrity: Goal: Risk for impaired skin integrity will decrease Outcome: Not Progressing   Problem: Fluid Volume: Goal: Hemodynamic stability will improve Outcome: Not Progressing   Problem: Clinical Measurements: Goal: Diagnostic test results will improve Outcome: Not Progressing Goal: Signs and symptoms of infection will decrease Outcome: Not Progressing   Problem: Respiratory: Goal: Ability to maintain adequate ventilation will improve Outcome: Not Progressing   Problem: Education: Goal: Ability to demonstrate management of disease process will improve Outcome: Not Progressing Goal: Ability to verbalize understanding of medication therapies will improve Outcome: Not Progressing Goal: Individualized Educational Video(s) Outcome: Not Progressing   Problem: Activity: Goal: Capacity to carry out activities will improve Outcome: Not Progressing   Problem: Cardiac: Goal: Ability to achieve and maintain adequate cardiopulmonary perfusion will improve Outcome: Not Progressing

## 2023-12-08 NOTE — TOC Initial Note (Signed)
 Transition of Care [facility]) - Initial/Assessment Note    Patient Details  Name: Terrance Miller MRN: [id number] Date of Birth: 08-28-1928  Transition of Care [facility]) CM/SW Contact:    Sharlyne Stabs, RN Phone Number: 12/08/2023, 4:11 PM  Clinical Narrative:     Patient admitted with septic. Patient is from [facility]. Palliative consulted, poor prognosis. IPCM following.               Expected Discharge Plan: Long Term Nursing Home Barriers to Discharge: Continued Medical Work up   Patient Goals and CMS Choice Patient states their goals for this hospitalization and ongoing recovery are:: return to [email or web address] Compare Post Acute Care list provided to:: Patient Represenative (must comment) Choice offered to / list presented to : Adult Children [place] ownership interest in [facility].provided to:: Adult Children    Expected Discharge Plan and Services                  Activities of Daily Living   ADL Screening (condition at time of admission) Independently performs ADLs?: No Does the patient have a NEW difficulty with bathing/dressing/toileting/self-feeding that is expected to last >3 days?: Yes (Initiates electronic notice to provider for possible OT consult) Does the patient have a NEW difficulty with getting in/out of bed, walking, or climbing stairs that is expected to last >3 days?: Yes (Initiates electronic notice to provider for possible PT consult) Does the patient have a NEW difficulty with communication that is expected to last >3 days?: Yes (Initiates electronic notice to provider for possible SLP consult) Is the patient deaf or have difficulty hearing?: No Does the patient have difficulty seeing, even when wearing glasses/contacts?: No Does the patient have difficulty concentrating, remembering, or making decisions?: Yes  Permission Sought/Granted          Admission diagnosis:  Dehydration [E86.0] Bradycardia [R00.1] Septic  shock (HCC) [A41.9, R65.21] Hypotension, unspecified hypotension type [I95.9] Acute renal failure, unspecified acute renal failure type [N17.9] Patient Active Problem List   Diagnosis Date Noted   Septic shock (HCC) 12/08/2023   C. difficile colitis 06/05/2023   Bradycardia 06/04/2023   Hypotension 06/04/2023   Lactic acidosis 06/04/2023   Type 2 diabetes mellitus with hyperglycemia (HCC) 06/04/2023   New onset atrial fibrillation (HCC) 06/03/2023   Dementia with behavioral disturbance (HCC) 05/29/2023   Acute on chronic heart failure with preserved ejection fraction (HFpEF) (HCC) 05/10/2023   Stercoral colitis 05/04/2023   (HFpEF) heart failure with preserved ejection fraction (HCC) 05/04/2023   Nausea with vomiting 05/04/2023   DNR (do not resuscitate) 05/04/2023   FTT (failure to thrive) in adult 05/04/2023   UTI (urinary tract infection) 04/21/2023   Thrombocytopenia 04/21/2023   Acute on chronic diastolic CHF (congestive heart failure) (HCC) 04/21/2023   Obesity, Class III, BMI 40-49.9 (morbid obesity) (HCC) 04/21/2023   Injury to scrotum, penis, or foreskin 03/18/2023   Type 2 diabetes mellitus with chronic kidney disease, with long-term current use of insulin  (HCC) 12/20/2022   Glaucoma 12/20/2022   Vascular dementia without behavioral disturbance (HCC) 08/15/2022   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   Aortic atherosclerosis 08/05/2022   Chronic constipation 08/05/2022   Increased intraocular pressure, bilateral 08/05/2022   Edema, peripheral 08/05/2022   Aortic regurgitation 03/17/2022   Pulmonary HTN (HCC) 03/17/2022   Dry skin dermatitis 02/05/2022   Stage 3b chronic kidney disease (HCC) 11/05/2021   MGUS (monoclonal gammopathy of unknown significance) 11/05/2021   Acute metabolic  encephalopathy 07/27/2021   Macrocytic anemia 05/31/2021   History of MI (myocardial infarction) 04/29/2021   Hypothermia 05/20/2017   Severe sepsis with septic  shock (HCC) 05/20/2017   Essential hypertension 09/28/2013   Type 2 diabetes mellitus with hyperlipidemia (HCC) 09/28/2013   CAD (coronary artery disease) 09/28/2013   PCP:  [name], DO Pharmacy:   [facility] - [place], [place] - [address] [place] [address] Phone: [phone] Fax: [phone]  [facility] - [place], [place] - [address] [place] [address] Phone: [phone] Fax: [phone]     Social Drivers of Health (SDOH) Social History: SDOH Screenings   Food Insecurity: No Food Insecurity (12/08/2023)  Housing: Low Risk  (12/08/2023)  Transportation Needs: No Transportation Needs (12/08/2023)  Utilities: Not At Risk (12/08/2023)  Alcohol Screen: Low Risk  (01/05/2023)  Depression (PHQ2-9): Medium Risk (03/17/2023)  Financial Resource Strain: Low Risk  (01/05/2023)  Physical Activity: Inactive (01/05/2023)  Social Connections: Unknown (06/03/2023)  Recent Concern: Social Connections - Socially Isolated (05/04/2023)  Stress: No Stress Concern Present (01/05/2023)  Tobacco Use: Medium Risk (12/08/2023)  Health Literacy: Adequate Health Literacy (01/05/2023)   SDOH Interventions:     Readmission Risk Interventions    12/08/2023    4:11 PM 06/04/2023    3:46 PM 04/24/2023    1:31 PM  Readmission Risk Prevention Plan  Transportation Screening Complete Complete Complete  HRI or Home Care Consult   Complete  Social Work Consult for Recovery Care Planning/Counseling   Complete  Palliative Care Screening   Complete  Medication Review [employer]) Complete Complete Complete  PCP or Specialist appointment within 3-5 days of discharge Not Complete    HRI or Home Care Consult Complete Complete   SW Recovery Care/Counseling Consult Complete Complete   Palliative Care Screening Not Complete Not Applicable   Skilled Nursing Facility Complete Complete

## 2023-12-08 NOTE — Plan of Care (Signed)
 ?  Problem: Elimination: ?Goal: Will not experience complications related to urinary retention ?Outcome: Progressing ?  ?

## 2023-12-08 NOTE — ED Notes (Signed)
 Bair hugger applied.

## 2023-12-08 NOTE — Progress Notes (Signed)
 Pharmacy Antibiotic Note  Terrance Miller is a 89 y.o. male admitted on 12/08/2023 with sepsis.  Pharmacy has been consulted for Vancomycin  dosing. WBC WNL. Acute on chronic renal failure.   Plan: Vancomycin  1500 mg IV x 1  >>>Further dosing based on Scr trend Cefepime  per MD F/U infectious work-up Drug levels as indicated   Height: 5' 8 (172.7 cm) Weight: 79.7 kg (175 lb 11.3 oz) IBW/kg (Calculated) : 68.4  Temp (24hrs), Avg:87.3 F (30.7 C), Min:86.2 F (30.1 C), Max:88.4 F (31.3 C)  Recent Labs  Lab 12/08/23 0036 12/08/23 0209  WBC 5.2  --   CREATININE 4.18*  --   LATICACIDVEN 3.3* 3.1*    Estimated Creatinine Clearance: 10.7 mL/min (A) (by C-G formula based on SCr of 4.18 mg/dL (H)).    Allergies  Allergen Reactions   Clobetasol  Other (See Comments)    Unknown  No reaction listed on MAR from facility    Lynwood Mckusick, PharmD, BCPS Clinical Pharmacist Phone: [phone]

## 2023-12-08 NOTE — ED Provider Notes (Signed)
 [place] EMERGENCY DEPARTMENT AT [facility] Provider Note   CSN: [id number] Arrival date & time: 12/08/23  0002     Patient presents with: Hypotension   Terrance Miller is a 89 y.o. male.   Patient is a 89 year old male with past medical history of hypertension, coronary artery disease, hyperlipidemia, type 2 diabetes, dementia.  Patient brought by EMS from his extended care facility for evaluation of altered mental status.  Patient found to be altered this evening with a heart rate in the 30s and hypotensive with blood pressure in the 70s.  EMS was called and patient transported here.  He is altered and unable to provide any additional history.       Prior to Admission medications   Medication Sig Start Date End Date Taking? Authorizing Provider  acetaminophen  (TYLENOL ) 325 MG tablet Take 2 tablets (650 mg total) by mouth every 6 (six) hours as needed for mild pain (pain score 1-3) (or Fever >/= 101). 04/27/23   Emokpae, Courage, MD  albuterol  (PROVENTIL ) (2.5 MG/3ML) 0.083% nebulizer solution Take 2.5 mg by nebulization every 6 (six) hours as needed for wheezing or shortness of breath.    [provider]  amLODipine  (NORVASC ) 10 MG tablet Take 1 tablet (10 mg total) by mouth daily. 04/27/23 04/26/24  Pearlean Manus, MD  aspirin  EC 81 MG tablet Take 1 tablet (81 mg total) by mouth daily with breakfast. Swallow whole. 04/27/23   Pearlean Manus, MD  bisacodyl  (DULCOLAX) 10 MG suppository Place 1 suppository (10 mg total) rectally every Monday, Wednesday, and Friday. 01/31/22   Pearlean Manus, MD  cholecalciferol  (VITAMIN D ) 1000 units tablet Take 1,000 Units by mouth daily.    [provider]  Cholecalciferol  25 MCG (1000 UT) CHEW Chew by mouth daily at 6 (six) AM. 25 mcg daily    [provider]  cyanocobalamin  (VITAMIN B12) 1000 MCG tablet Take 1,000 mcg by mouth daily.    [provider]  docusate sodium  (COLACE) 100 MG capsule Take 100  mg by mouth 2 (two) times daily.    [provider]  feeding supplement (ENSURE PLUS HIGH PROTEIN) LIQD Take 237 mLs by mouth 2 (two) times daily between meals. 06/08/23   Evonnie Lenis, MD  ferrous sulfate 324 MG TBEC Take 324 mg by mouth.    [provider]  folic acid  (FOLVITE ) 1 MG tablet Take 1 tablet (1 mg total) by mouth daily. 03/17/23   Cook, [name], DO  furosemide  (LASIX ) 40 MG tablet Take 40 mg by mouth daily. 09/28/23   [provider]  latanoprost  (XALATAN ) 0.005 % ophthalmic solution Place 1 drop into both eyes 2 (two) times daily. 08/20/22   Landy Barnie RAMAN, NP  melatonin 3 MG TABS tablet Take 3 mg by mouth at bedtime.    [provider]  memantine  (NAMENDA ) 5 MG tablet Take 5 mg by mouth daily.    [provider]  polyethylene glycol (MIRALAX  / GLYCOLAX ) 17 g packet Take 17 g by mouth 2 (two) times daily. 01/31/22   Pearlean Manus, MD  rosuvastatin  (CRESTOR ) 5 MG tablet Take 5 mg by mouth daily. Patient not taking: Reported on 11/18/2023    [provider]  senna (SENOKOT) 8.6 MG TABS tablet Take 2 tablets (17.2 mg total) by mouth at bedtime. 04/27/23   Pearlean Manus, MD  sertraline  (ZOLOFT ) 25 MG tablet Take 25 mg by mouth daily.    [provider]  tamsulosin  (FLOMAX ) 0.4 MG CAPS capsule  Take 1 capsule (0.4 mg total) by mouth daily. 08/20/22   Landy Barnie RAMAN, NP  vancomycin  (VANCOCIN ) 50 mg/mL SOLN oral solution Take 2.5 mLs (125 mg total) by mouth 4 (four) times daily. X 7 days 06/08/23   Evonnie Lenis, MD    Allergies: Clobetasol     Review of Systems  All other systems reviewed and are negative.   Updated Vital Signs BP (!) 70/47   Pulse (!) 33   Temp (!) 86.2 F (30.1 C) (Rectal)   Resp 12   Ht 5' 8 (1.727 m)   Wt 79.7 kg   SpO2 95%   BMI 26.72 kg/m   Physical Exam Vitals and nursing note reviewed.  Constitutional:      General: He is not in acute distress.    Appearance: He is well-developed. He is  not diaphoretic.     Comments: Patient is somnolent with altered mental status.  He will localize to noxious stimuli, but will not speak or follow commands.  HENT:     Head: Normocephalic and atraumatic.  Cardiovascular:     Rate and Rhythm: Normal rate and regular rhythm.     Heart sounds: No murmur heard.    No friction rub.  Pulmonary:     Effort: Pulmonary effort is normal. No respiratory distress.     Breath sounds: Normal breath sounds. No wheezing or rales.  Abdominal:     General: Bowel sounds are normal. There is no distension.     Palpations: Abdomen is soft.     Tenderness: There is no abdominal tenderness.  Musculoskeletal:        General: Normal range of motion.     Cervical back: Normal range of motion and neck supple.  Skin:    General: Skin is warm and dry.  Neurological:     Coordination: Coordination normal.     Comments: He does move all 4 extremities and localizes to pain.  Otherwise he is unresponsive.     (all labs ordered are listed, but only abnormal results are displayed) Labs Reviewed  CBC WITH DIFFERENTIAL/PLATELET  COMPREHENSIVE METABOLIC PANEL WITH GFR  LACTIC ACID, PLASMA  LACTIC ACID, PLASMA    EKG: None  Radiology: No results found.   Procedures   Medications Ordered in the ED  sodium chloride  0.9 % bolus 1,000 mL (has no administration in time range)                                    Medical Decision Making Amount and/or Complexity of Data Reviewed Labs: ordered. Radiology: ordered.  Risk Decision regarding hospitalization.   Patient is a 89 year old male with past medical history per HPI sent from his extended care facility for evaluation of altered mental status.  He became less responsive this evening, then was found to have a heart rate of 30 and blood pressure of barely 70.  EMS was called and patient transported here.  He arrived here acutely ill, hypotensive, and bradycardic.  His initial rectal temperature was 86  F.  Laboratory studies obtained including CBC, CMP, and lactate.  Hemoglobin is 7.0 with platelet count of 14,000.  Metabolic profile reveals a BUN of 77 and creatinine of 4.2, both of which are significantly elevated from prior studies.  AST, ALT, and alk phos are all elevated as well.  CO2 is 14.  Chest x-ray showing no acute process.  Patient received IV fluids  by EMS, then I initiated a second liter here while assessing the situation.  I did speak to the patient's daughter, [name] shortly after the patient's arrival.  I informed her that her father was critically ill and likely approaching death.  She came to the ER and remained at his bedside throughout his ED course.  Our initial conversation concluded with the agreement to honor the patient's wishes of no intubation and no resuscitation.  She declined to allow us  to obtain a urine sample by catheterization as she did not want any more invasive procedures performed.  We had decided to keep him comfortable, but pursue no further measures.  I then consulted the hospitalist for admission for comfort care.  Dr. Manfred evaluated the patient in the ER and spoke with the daughter.  She apparently began having feelings of guilt for not doing more, so agreed at this time to initiate peripheral pressors and antibiotics.  Patient will be admitted to the intensive care unit for further evaluation.  CRITICAL CARE Performed by: Vicenta Able Total critical care time: 45 minutes Critical care time was exclusive of separately billable procedures and treating other patients. Critical care was necessary to treat or prevent imminent or life-threatening deterioration. Critical care was time spent personally by me on the following activities: development of treatment plan with patient and/or surrogate as well as nursing, discussions with consultants, evaluation of patient's response to treatment, examination of patient, obtaining history from patient or surrogate,  ordering and performing treatments and interventions, ordering and review of laboratory studies, ordering and review of radiographic studies, pulse oximetry and re-evaluation of patient's condition.      Final diagnoses:  None    ED Discharge Orders     None          Able Vicenta, MD 12/08/23 [phone]

## 2023-12-09 DIAGNOSIS — E860 Dehydration: Secondary | ICD-10-CM

## 2023-12-09 DIAGNOSIS — A419 Sepsis, unspecified organism: Secondary | ICD-10-CM | POA: Diagnosis not present

## 2023-12-09 DIAGNOSIS — R6521 Severe sepsis with septic shock: Secondary | ICD-10-CM | POA: Diagnosis not present

## 2023-12-09 DIAGNOSIS — N179 Acute kidney failure, unspecified: Secondary | ICD-10-CM | POA: Diagnosis not present

## 2023-12-09 LAB — BLOOD GAS, VENOUS
Acid-base deficit: 4.6 mmol/L — ABNORMAL HIGH (ref 0.0–2.0)
Bicarbonate: 19.4 mmol/L — ABNORMAL LOW (ref 20.0–28.0)
Drawn by: 1528
O2 Saturation: 96.2 %
Patient temperature: 36.1
pCO2, Ven: 31 mmHg — ABNORMAL LOW (ref 44–60)
pH, Ven: 7.4 (ref 7.25–7.43)
pO2, Ven: 60 mmHg — ABNORMAL HIGH (ref 32–45)

## 2023-12-09 LAB — CBC
HCT: 20 % — ABNORMAL LOW (ref 39.0–52.0)
Hemoglobin: 6.6 g/dL — CL (ref 13.0–17.0)
MCH: 33.8 pg (ref 26.0–34.0)
MCHC: 33 g/dL (ref 30.0–36.0)
MCV: 102.6 fL — ABNORMAL HIGH (ref 80.0–100.0)
Platelets: 18 K/uL — CL (ref 150–400)
RBC: 1.95 MIL/uL — ABNORMAL LOW (ref 4.22–5.81)
RDW: 24.2 % — ABNORMAL HIGH (ref 11.5–15.5)
WBC: 10 K/uL (ref 4.0–10.5)
nRBC: 3.7 % — ABNORMAL HIGH (ref 0.0–0.2)

## 2023-12-09 LAB — URINE CULTURE: Culture: <10000 — AB

## 2023-12-09 LAB — GLUCOSE, CAPILLARY
Glucose-Capillary: 85 mg/dL (ref 70–99)
Glucose-Capillary: 85 mg/dL (ref 70–99)
Glucose-Capillary: 86 mg/dL (ref 70–99)
Glucose-Capillary: 87 mg/dL (ref 70–99)
Glucose-Capillary: 87 mg/dL (ref 70–99)
Glucose-Capillary: 91 mg/dL (ref 70–99)

## 2023-12-09 LAB — COMPREHENSIVE METABOLIC PANEL WITH GFR
ALT: 133 U/L — ABNORMAL HIGH (ref 0–44)
AST: 87 U/L — ABNORMAL HIGH (ref 15–41)
Albumin: 3.4 g/dL — ABNORMAL LOW (ref 3.5–5.0)
Alkaline Phosphatase: 146 U/L — ABNORMAL HIGH (ref 38–126)
Anion gap: 17 — ABNORMAL HIGH (ref 5–15)
BUN: 80 mg/dL — ABNORMAL HIGH (ref 8–23)
CO2: 18 mmol/L — ABNORMAL LOW (ref 22–32)
Calcium: 7.7 mg/dL — ABNORMAL LOW (ref 8.9–10.3)
Chloride: 110 mmol/L (ref 98–111)
Creatinine, Ser: 4.63 mg/dL — ABNORMAL HIGH (ref 0.61–1.24)
GFR, Estimated: 11 mL/min — ABNORMAL LOW (ref >60)
Glucose, Bld: 89 mg/dL (ref 70–99)
Potassium: 4.6 mmol/L (ref 3.5–5.1)
Sodium: 144 mmol/L (ref 135–145)
Total Bilirubin: 0.7 mg/dL (ref 0.0–1.2)
Total Protein: 6.4 g/dL — ABNORMAL LOW (ref 6.5–8.1)

## 2023-12-09 MED ORDER — DEXTROSE-SODIUM CHLORIDE 5-0.45 % IV SOLN: INTRAVENOUS | Status: AC

## 2023-12-09 MED ORDER — SODIUM CHLORIDE 0.9 % IV SOLN
1.0000 g | INTRAVENOUS | Status: AC
  Administered 2023-12-10 – 2023-12-17 (×8): 1 g via INTRAVENOUS
  Filled 2023-12-09 (×11): qty 10

## 2023-12-09 NOTE — [place] Notes (Addendum)
 [place]ESS NOTE    Terrance Miller  FMW:[id number] DOB: 1928/08/17 DOA: 12/08/2023 PCP: Cook, [name], DO   Chief Complaint  Patient presents with   Hypotension    Brief Hospital admission narrative:  As per H&P written by Dr. Manfred on 12/08/2023  Terrance Miller is a 89 y.o. male with medical history significant of hypertension, hyperlipidemia, MGUS, CKD stage III, diet-controlled diabetes mellitus type 2, thrombocytopenia, dementia, HFpEF, coronary disease who presents to the emergency department via EMS due to altered mental status.  She was noted with altered mental status for the SNF yesterday in the evening with heart rate in 30s and patient was hypotensive with SBP in the 70s.   Patient was admitted from 5/28 to 6/2 and presented with similar presentation where he had hypothermia and hypotension as well as C. difficile colitis.   ED course In the emergency department, he was hypothermic with temperature 86.71F, respiratory rate 17, pulse 27 bpm, BP 67/40, O2 sats was 88% on 6 LPM.  Workup in the ED showed macrocytic anemia with MCV of 103.8, hemoglobin 7.0, platelets 14.  BMP shows sodium of 148, potassium 4.8, chloride 117, bicarb 14 blood glucose 118, BUN 77, creatinine 4.18 calcium  8.4, albumin 3.4, AST 128, ALT 149, ALP 151, eGFR 13, anion gap 18 Chest x-ray showed stable cardiomegaly with low lung volumes with mild bibasilar atelectasis Patient was started on IV vancomycin  and cefepime .  IV NS 2 L was provided in the ED Discussion was held with daughter at bedside who wanted to try peripheral IV pressors, but does not want any more invasive procedures and was willing to make patient comfort care if patient does not respond to pressors and antibiotics.  She objected to catheterizing patient to obtain urinalysis.    Assessment & Plan: 1-septic shock - With concerns for UTI - Patient continued to demonstrate the need for peripheral pressors, continue to experience hypothermia, ongoing  hypoglycemia and inability to follow any commands or being able to maintain nutrition on his own. - Extensive discussion with family members at bedside complain overall poor prognosis. - For now continue current therapy without escalating management. - They have confirmed DNR/DNI. - Continue D5/half-normal saline and bicarbonate drip.  2-lactic acidosis/high anion gap metabolic acidosis - Secondary to problem #1 - Up to 3.3 at time of admission - After fluid resuscitation down to 2.1. - Gap up to 18 at time of admission - As specified will continue fluid resuscitation.  3-acute metabolic encephalopathy in the setting of septic shock - Ongoing/unchanged - Continue supportive care. - Patient is not following any commands and is at the moment unresponsive.  4-thrombocytopenia platelets down to 14 - No overt bleeding - Family has expressed no wanting platelets or blood transfusion - Most likely associated with sepsis. - Continue avoiding heparin  products and use SCDs for DVT prophylaxis.  5-acute kidney injury on chronic kidney disease - Stage V at baseline  - Creatinine up to 4.8 - Per nursing report almost meeting criteria for anuria - Continue follow response.  6-chronic diastolic heart failure - Clinically dehydrated at the moment - In the setting of ongoing renal dysfunction and high risk for fluid overload.  7-mixed hyperlipidemia - Unable to take oral medications and currently with elevated transaminitis - Continue to hold statin.  8-transaminitis - In the setting of septic shock/shock of liver - Continue fluid resuscitation and follow LFTs intermittently.  9-ethics/social - Goals of care and poor prognosis discussed with family members at bedside - For  now continue current therapy without further escalation and maintain DNI/DNR status. - Anticipating transition to full comfort care in the next 24 hours with most likely hospital death.  DVT prophylaxis: SCDs Code  Status: DNR/DNI Family Communication: Discussed with patient's daughters and son at bedside. Disposition:   Status is: Inpatient Remains inpatient appropriate because: Continue IV therapy.   Consultants:  Palliative care  Procedures: See below for x-ray reports.  Antimicrobials:  Vancomycin  and cefepime  in place.  Subjective: Demonstrating difficulty to keep airways clear on his own; requiring Levophed  and also warming blankets.  Patient with intermittent ongoing episodes of hypoglycemia and inability to maintain nutrition on his own.  Objective: Vitals:   12/09/23 1730 12/09/23 1745 12/09/23 1800 12/09/23 1806  BP: (!) 126/53 116/60 (!) 126/58   Pulse: 78 73 82   Resp: 19 19 (!) 24   Temp:    (P) 97.6 F (36.4 C)  TempSrc:      SpO2: 99% 99% 100%   Weight:      Height:        Intake/Output Summary (Last 24 hours) at 12/09/2023 1814 Last data filed at 12/09/2023 0700 Gross per 24 hour  Intake 2433.7 ml  Output 0 ml  Net 2433.7 ml   Filed Weights   12/08/23 0004 12/08/23 0550  Weight: 79.7 kg 82 kg    Examination:  General exam: Unable to follow commands; demonstrating difficulties clearing his own secretions.  Hypothermic and requiring Levophed . Respiratory system: Positive scattered rhonchi; no using accessory muscles. Cardiovascular system: Rate controlled, no rubs, no gallops, no JVD on exam. Gastrointestinal system: Abdomen is slightly distended, soft, no grimaces or appreciated tenderness on palpation.  Positive bowel sounds. Central nervous system: Limited examination; patient unable to follow commands.  Intermittently moving limbs spontaneously.   Extremities: No cyanosis or clubbing. Skin: No petechiae. Psychiatry: Judgement, mood and insight unable to assess with current mentation.    Data Reviewed: I have personally reviewed following labs and imaging studies  CBC: Recent Labs  Lab 12/08/23 0036 12/09/23 0333  WBC 5.2 10.0  NEUTROABS 4.7  --    HGB 7.0* 6.6*  HCT 21.6* 20.0*  MCV 103.8* 102.6*  PLT 14* 18*    Basic Metabolic Panel: Recent Labs  Lab 12/08/23 0036 12/08/23 0209 12/09/23 0333  NA 148*  --  144  K 4.8  --  4.6  CL 117*  --  110  CO2 14*  --  18*  GLUCOSE 118*  --  89  BUN 77*  --  80*  CREATININE 4.18*  --  4.63*  CALCIUM  8.4*  --  7.7*  MG  --  2.8*  --   PHOS  --  6.4*  --     GFR: Estimated Creatinine Clearance: 9.6 mL/min (A) (by C-G formula based on SCr of 4.63 mg/dL (H)).  Liver Function Tests: Recent Labs  Lab 12/08/23 0036 12/09/23 0333  AST 128* 87*  ALT 149* 133*  ALKPHOS 151* 146*  BILITOT 0.8 0.7  PROT 6.3* 6.4*  ALBUMIN 3.4* 3.4*    CBG: Recent Labs  Lab 12/08/23 2342 12/09/23 0408 12/09/23 0803 12/09/23 1117 12/09/23 1640  GLUCAP 78 86 91 87 85     Recent Results (from the past 240 hours)  MRSA Next Gen by PCR, Nasal     Status: Abnormal   Collection Time: 12/08/23  6:00 AM   Specimen: Nasal Mucosa; Nasal Swab  Result Value Ref Range Status   MRSA by PCR Next  Gen DETECTED (A) NOT DETECTED Final    Comment: CRITICAL RESULT CALLED TO, READ BACK BY AND VERIFIED WITH: O PUCKETT AT 0855 ON 12.02.25 BY ADGER J         The GeneXpert MRSA Assay (FDA approved for NASAL specimens only), is one component of a comprehensive MRSA colonization surveillance program. It is not intended to diagnose MRSA infection nor to guide or monitor treatment for MRSA infections. Performed at [facility], [address] [place], [place] [address]     Radiology Studies: [facility] 1 View Result Date: 12/08/2023 CLINICAL DATA:  Hypotension. EXAM: PORTABLE CHEST 1 VIEW COMPARISON:  Jun 03, 2023 FINDINGS: Limited study secondary to patient rotation. The cardiac silhouette is mildly enlarged. This may be, in part, technical in origin. There is marked severity calcification of the aortic arch. Low lung volumes are noted with mild atelectatic changes seen within the bilateral lung  bases. No pleural effusion or pneumothorax is identified. Multilevel degenerative changes are present throughout the thoracic spine. IMPRESSION: Stable cardiomegaly with low lung volumes with mild bibasilar atelectasis. Electronically Signed   By: Suzen Dials M.D.   On: 12/08/2023 00:40   Scheduled Meds:  Chlorhexidine  Gluconate Cloth  6 each Topical Daily   mupirocin  ointment   Nasal BID   mouth rinse  15 mL Mouth Rinse 4 times per day   scopolamine  1 patch Transdermal Q72H   vancomycin  variable dose per unstable renal function (pharmacist dosing)   Does not apply See admin instructions   Continuous Infusions:  [START ON 12/10/2023] ceFEPime  (MAXIPIME ) IV     dextrose  5 % and 0.45 % NaCl 50 mL/hr at 12/09/23 1657   norepinephrine  (LEVOPHED ) Adult infusion 9 mcg/min (12/09/23 1131)   sodium bicarbonate 150 mEq in sterile water  1,150 mL infusion 50 mL/hr at 12/09/23 0700     LOS: 1 day   CRITICAL CARE Performed by: Eric Nunnery   Total critical care time: 55 minutes  Critical care time was exclusive of separately billable procedures and treating other patients.  Critical care was necessary to treat or prevent imminent or life-threatening deterioration.  Critical care was time spent personally by me on the following activities: development of treatment plan with patient and/or surrogate as well as nursing, discussions with consultants, evaluation of patient's response to treatment, examination of patient, obtaining history from patient or surrogate, ordering and performing treatments and interventions, ordering and review of laboratory studies, ordering and review of radiographic studies, pulse oximetry and re-evaluation of patient's condition.   Eric Nunnery, MD [facility]   To contact the attending provider between 7A-7P or the covering provider during after hours 7P-7A, please log into the web site [email or web address] and access using universal [place] password for  that web site. If you do not have the password, please call the hospital operator.  12/09/2023, 6:14 PM

## 2023-12-09 NOTE — Plan of Care (Signed)
  Problem: Nutrition: Goal: Adequate nutrition will be maintained Outcome: Not [place]ing   Problem: Elimination: Goal: Will not experience complications related to bowel motility Outcome: Not [place]ing Goal: Will not experience complications related to urinary retention Outcome: Not [place]ing   Problem: Pain Managment: Goal: General experience of comfort will improve and/or be controlled Outcome: Not [place]ing   Problem: Skin Integrity: Goal: Risk for impaired skin integrity will decrease Outcome: Not [place]ing

## 2023-12-09 NOTE — [place] Notes (Addendum)
 Upon this RN's shift assessment, Terrance Miller only arouses to painful stimuli. He snoring respirations with congested and gurgling breathing. Rhonchi noted in bilateral upper and lower lung fields. This RN was able to provide mouth care and oral suction via yankauer. Pitting edema present in all 4 extremities. IV sites are infusing well. Levo infusing through R forearm. Heart rate is irregular with frequent pvc's. SPO2 is maintained at upper 90's on 4 LN HFNC.  Current temp via rectal probe and blanket warmer is 97.3. BP is 97/56 MAP of 71 with Levophed  at . Kellogg RN

## 2023-12-10 DIAGNOSIS — R6521 Severe sepsis with septic shock: Secondary | ICD-10-CM | POA: Diagnosis not present

## 2023-12-10 DIAGNOSIS — A419 Sepsis, unspecified organism: Secondary | ICD-10-CM | POA: Diagnosis not present

## 2023-12-10 LAB — GLUCOSE, CAPILLARY
Glucose-Capillary: 69 mg/dL — ABNORMAL LOW (ref 70–99)
Glucose-Capillary: 69 mg/dL — ABNORMAL LOW (ref 70–99)
Glucose-Capillary: 70 mg/dL (ref 70–99)
Glucose-Capillary: 74 mg/dL (ref 70–99)
Glucose-Capillary: 78 mg/dL (ref 70–99)
Glucose-Capillary: 88 mg/dL (ref 70–99)

## 2023-12-10 LAB — VANCOMYCIN, RANDOM: Vancomycin Rm: 12 ug/mL

## 2023-12-10 MED ORDER — VANCOMYCIN HCL 750 MG/150ML IV SOLN
750.0000 mg | INTRAVENOUS | Status: DC
  Administered 2023-12-10: 750 mg via INTRAVENOUS
  Filled 2023-12-10: qty 150

## 2023-12-10 MED ORDER — DEXTROSE-SODIUM CHLORIDE 5-0.45 % IV SOLN: INTRAVENOUS | Status: DC

## 2023-12-10 NOTE — [place] Notes (Signed)
 Daughter was talking with sister and asking about stopping some care interventions while keeping some interventions in place.  Requested hospital MD to contact to discuss care options appropriate for patient.  Will continue current care plan at this point.

## 2023-12-10 NOTE — [place] Notes (Signed)
 [place]ESS NOTE    Terrance Miller  FMW:[id number] DOB: 04-22-1928 DOA: 12/08/2023 PCP: Cook, [name], DO   Chief Complaint  Patient presents with   Hypotension    Brief Hospital admission narrative:  As per H&P written by Dr. Manfred on 12/08/2023  Terrance Miller is a 89 y.o. male with medical history significant of hypertension, hyperlipidemia, MGUS, CKD stage III, diet-controlled diabetes mellitus type 2, thrombocytopenia, dementia, HFpEF, coronary disease who presents to the emergency department via EMS due to altered mental status.  She was noted with altered mental status for the SNF yesterday in the evening with heart rate in 30s and patient was hypotensive with SBP in the 70s.   Patient was admitted from 5/28 to 6/2 and presented with similar presentation where he had hypothermia and hypotension as well as C. difficile colitis.   ED course In the emergency department, he was hypothermic with temperature 86.8F, respiratory rate 17, pulse 27 bpm, BP 67/40, O2 sats was 88% on 6 LPM.  Workup in the ED showed macrocytic anemia with MCV of 103.8, hemoglobin 7.0, platelets 14.  BMP shows sodium of 148, potassium 4.8, chloride 117, bicarb 14 blood glucose 118, BUN 77, creatinine 4.18 calcium  8.4, albumin 3.4, AST 128, ALT 149, ALP 151, eGFR 13, anion gap 18 Chest x-ray showed stable cardiomegaly with low lung volumes with mild bibasilar atelectasis Patient was started on IV vancomycin  and cefepime .  IV NS 2 L was provided in the ED Discussion was held with daughter at bedside who wanted to try peripheral IV pressors, but does not want any more invasive procedures and was willing to make patient comfort care if patient does not respond to pressors and antibiotics.  She objected to catheterizing patient to obtain urinalysis.    Assessment & Plan: 1-septic shock - With concerns for UTI - Patient continued to demonstrate the need for peripheral pressors, continue to experience hypothermia, ongoing  hypoglycemia and inability to follow any commands or being able to maintain nutrition on his own. - Extensive discussion with family members at bedside complain overall poor prognosis. - For now continue current therapy without escalating management. - They have confirmed DNR/DNI. - Continue D5/half-normal saline and bicarbonate drip.  2-lactic acidosis/high anion gap metabolic acidosis - Secondary to problem #1 - Up to 3.3 at time of admission - After fluid resuscitation down to 2.1. - Gap up to 18 at time of admission - As specified will continue fluid resuscitation.  3-acute metabolic encephalopathy in the setting of septic shock - Ongoing/unchanged - Continue supportive care. - Patient is not following any commands and is at the moment unresponsive.  4-thrombocytopenia platelets down to 14 - No overt bleeding - Family has expressed no wanting platelets or blood transfusion - Most likely associated with sepsis. - Continue avoiding heparin  products and use SCDs for DVT prophylaxis.  5-acute kidney injury on chronic kidney disease - Stage V at baseline  - Creatinine up to 4.8 - Per nursing report almost meeting criteria for anuria - Continue follow response.  6-chronic diastolic heart failure - Clinically dehydrated at the moment - In the setting of ongoing renal dysfunction and high risk for fluid overload.  7-mixed hyperlipidemia - Unable to take oral medications and currently with elevated transaminitis - Continue to hold statin.  8-transaminitis - In the setting of septic shock/shock of liver - Continue fluid resuscitation and follow LFTs intermittently.  9-ethics/social - Goals of care and poor prognosis discussed with family members at bedside - For  now continue current therapy without further escalation and maintain DNI/DNR status. - Anticipating transition to full comfort care in the next 24 hours with most likely hospital death. - I have discussed with family  members over the phone and they ask for another 24 hours prior to final decision as it is some family members that they had not been able to reach out to yet.  DVT prophylaxis: SCDs Code Status: DNR/DNI Family Communication: Discussed with patient's daughters and son at bedside. Disposition:   Status is: Inpatient Remains inpatient appropriate because: Continue IV therapy.   Consultants:  Palliative care  Procedures: See below for x-ray reports.  Antimicrobials:  Vancomycin  and cefepime  in place.  Subjective: Responsive to painful stimuli only; demonstrating difficulty clearing airways.  Requiring Levophed , dextrose  infusion to avoid hypoglycemic events and continue to have ongoing hypothermia with requirement of warming blankets.  Medical prognosis.  Objective: Vitals:   12/10/23 1015 12/10/23 1030 12/10/23 1045 12/10/23 1100  BP: (!) 85/64 (!) 100/46 (!) 115/54 107/62  Pulse: 90 72 67 78  Resp: 16 (!) 23 17 16   Temp:      TempSrc:      SpO2: 100% 98% 99% 99%  Weight:      Height:        Intake/Output Summary (Last 24 hours) at 12/10/2023 1423 Last data filed at 12/10/2023 1031 Gross per 24 hour  Intake 2327.04 ml  Output 300 ml  Net 2027.04 ml   Filed Weights   12/08/23 0004 12/08/23 0550  Weight: 79.7 kg 82 kg    Examination:  General exam: Obtunded; requiring intermittent suctioning to help clear and airways.  Responding just to painful stimuli. Respiratory system: Positive rhonchi bilaterally; no wheezing.  No using accessory muscles.  Intermittent tachypnea appreciated at time. Cardiovascular system: Rate controlled, no rubs, no gallops, no JVD on exam. Gastrointestinal system: Abdomen is nondistended, soft and appears to be nontender on palpation.  Positive bowel sounds appreciated. Central nervous system: Unable to assess with current mentation. Extremities: No cyanosis or clubbing; trace edema appreciated bilaterally. Skin: No petechiae. Psychiatry:  Unable to assess with current mentation.  Data Reviewed: I have personally reviewed following labs and imaging studies  CBC: Recent Labs  Lab 12/08/23 0036 12/09/23 0333  WBC 5.2 10.0  NEUTROABS 4.7  --   HGB 7.0* 6.6*  HCT 21.6* 20.0*  MCV 103.8* 102.6*  PLT 14* 18*    Basic Metabolic Panel: Recent Labs  Lab 12/08/23 0036 12/08/23 0209 12/09/23 0333  NA 148*  --  144  K 4.8  --  4.6  CL 117*  --  110  CO2 14*  --  18*  GLUCOSE 118*  --  89  BUN 77*  --  80*  CREATININE 4.18*  --  4.63*  CALCIUM  8.4*  --  7.7*  MG  --  2.8*  --   PHOS  --  6.4*  --     GFR: Estimated Creatinine Clearance: 9.6 mL/min (A) (by C-G formula based on SCr of 4.63 mg/dL (H)).  Liver Function Tests: Recent Labs  Lab 12/08/23 0036 12/09/23 0333  AST 128* 87*  ALT 149* 133*  ALKPHOS 151* 146*  BILITOT 0.8 0.7  PROT 6.3* 6.4*  ALBUMIN 3.4* 3.4*    CBG: Recent Labs  Lab 12/09/23 1640 12/09/23 1914 12/09/23 2338 12/10/23 0328 12/10/23 0739  GLUCAP 85 87 85 70 69*     Recent Results (from the past 240 hours)  MRSA Next Gen by  PCR, Nasal     Status: Abnormal   Collection Time: 12/08/23  6:00 AM   Specimen: Nasal Mucosa; Nasal Swab  Result Value Ref Range Status   MRSA by PCR Next Gen DETECTED (A) NOT DETECTED Final    Comment: CRITICAL RESULT CALLED TO, READ BACK BY AND VERIFIED WITH: O PUCKETT AT 0855 ON 12.02.25 BY ADGER J         The GeneXpert MRSA Assay (FDA approved for NASAL specimens only), is one component of a comprehensive MRSA colonization surveillance program. It is not intended to diagnose MRSA infection nor to guide or monitor treatment for MRSA infections. Performed at [facility], [address] [place], [place] [address]   Urine Culture     Status: Abnormal   Collection Time: 12/08/23  2:32 PM   Specimen: Urine, Random  Result Value Ref Range Status   Specimen Description   Final    URINE, RANDOM Performed at [facility], [address]  [place], [place] [address]    Special Requests   Final    NONE Reflexed from [phone] Performed at [facility], [address], [place], [place] [address]    Culture (A)  Final    <10,000 COLONIES/mL INSIGNIFICANT GROWTH Performed at [facility], [address], [place], [place] [address]    Report Status 12/09/2023 FINAL  Final    Radiology Studies: No results found.  Scheduled Meds:  Chlorhexidine  Gluconate Cloth  6 each Topical Daily   mupirocin  ointment   Nasal BID   mouth rinse  15 mL Mouth Rinse 4 times per day   scopolamine  1 patch Transdermal Q72H   vancomycin  variable dose per unstable renal function (pharmacist dosing)   Does not apply See admin instructions   Continuous Infusions:  ceFEPime  (MAXIPIME ) IV Stopped (12/10/23 0610)   dextrose  5 % and 0.45 % NaCl 50 mL/hr at 12/10/23 1406   norepinephrine  (LEVOPHED ) Adult infusion 5 mcg/min (12/10/23 1031)   sodium bicarbonate 150 mEq in sterile water  1,150 mL infusion 50 mL/hr at 12/10/23 0620   vancomycin  750 mg (12/10/23 1125)     LOS: 2 days   CRITICAL CARE Performed by: Eric Nunnery   Total critical care time: 50 minutes  Critical care time was exclusive of separately billable procedures and treating other patients.  Critical care was necessary to treat or prevent imminent or life-threatening deterioration.  Critical care was time spent personally by me on the following activities: development of treatment plan with patient and/or surrogate as well as nursing, discussions with consultants, evaluation of patient's response to treatment, examination of patient, obtaining history from patient or surrogate, ordering and performing treatments and interventions, ordering and review of laboratory studies, ordering and review of radiographic studies, pulse oximetry and re-evaluation of patient's condition.    Eric Nunnery, MD [facility]   To contact the attending provider between 7A-7P or the covering  provider during after hours 7P-7A, please log into the web site [email or web address] and access using universal [place] password for that web site. If you do not have the password, please call the hospital operator.  12/10/2023, 2:23 PM

## 2023-12-10 NOTE — [place] Notes (Signed)
 Terrance Miller was suctioned multiple times overnight. Secretions are thick, frothy, and bloody.  He was able to urinate of yellow cloudy urine. He remained arousable by painful stimuli and only responding with groans. Levo at 5mcg. CBG was 70 and NP Jesus was informed since D5% 0.45NS had been discontinued. D5.45NS reinitiated. Multiform PVC's happening frequently. Kellogg RN

## 2023-12-11 DIAGNOSIS — R6521 Severe sepsis with septic shock: Secondary | ICD-10-CM | POA: Diagnosis not present

## 2023-12-11 DIAGNOSIS — A419 Sepsis, unspecified organism: Secondary | ICD-10-CM | POA: Diagnosis not present

## 2023-12-11 LAB — BASIC METABOLIC PANEL WITH GFR
Anion gap: 17 — ABNORMAL HIGH (ref 5–15)
BUN: 86 mg/dL — ABNORMAL HIGH (ref 8–23)
CO2: 20 mmol/L — ABNORMAL LOW (ref 22–32)
Calcium: 7.5 mg/dL — ABNORMAL LOW (ref 8.9–10.3)
Chloride: 106 mmol/L (ref 98–111)
Creatinine, Ser: 4.7 mg/dL — ABNORMAL HIGH (ref 0.61–1.24)
GFR, Estimated: 11 mL/min — ABNORMAL LOW (ref >60)
Glucose, Bld: 87 mg/dL (ref 70–99)
Potassium: 4 mmol/L (ref 3.5–5.1)
Sodium: 142 mmol/L (ref 135–145)

## 2023-12-11 LAB — CBC
HCT: 18.6 % — ABNORMAL LOW (ref 39.0–52.0)
Hemoglobin: 6.2 g/dL — CL (ref 13.0–17.0)
MCH: 33.7 pg (ref 26.0–34.0)
MCHC: 33.3 g/dL (ref 30.0–36.0)
MCV: 101.1 fL — ABNORMAL HIGH (ref 80.0–100.0)
Platelets: 18 K/uL — CL (ref 150–400)
RBC: 1.84 MIL/uL — ABNORMAL LOW (ref 4.22–5.81)
RDW: 23.8 % — ABNORMAL HIGH (ref 11.5–15.5)
WBC: 5.1 K/uL (ref 4.0–10.5)
nRBC: 3.6 % — ABNORMAL HIGH (ref 0.0–0.2)

## 2023-12-11 LAB — GLUCOSE, CAPILLARY
Glucose-Capillary: 100 mg/dL — ABNORMAL HIGH (ref 70–99)
Glucose-Capillary: 102 mg/dL — ABNORMAL HIGH (ref 70–99)
Glucose-Capillary: 113 mg/dL — ABNORMAL HIGH (ref 70–99)
Glucose-Capillary: 89 mg/dL (ref 70–99)
Glucose-Capillary: 96 mg/dL (ref 70–99)
Glucose-Capillary: 96 mg/dL (ref 70–99)
Glucose-Capillary: 99 mg/dL (ref 70–99)

## 2023-12-11 LAB — PREPARE RBC (CROSSMATCH)

## 2023-12-11 MED ORDER — SODIUM CHLORIDE 0.9% IV SOLUTION: Freq: Once | INTRAVENOUS | Status: DC

## 2023-12-11 MED ORDER — DEXTROSE-SODIUM CHLORIDE 5-0.45 % IV SOLN: INTRAVENOUS | Status: AC

## 2023-12-11 NOTE — Plan of Care (Signed)
  Problem: Education: Goal: Knowledge of General Education information will improve Description: Including pain rating scale, medication(s)/side effects and non-pharmacologic comfort measures Outcome: Not Progressing   Problem: Health Behavior/Discharge Planning: Goal: Ability to manage health-related needs will improve Outcome: Not Progressing   Problem: Clinical Measurements: Goal: Ability to maintain clinical measurements within normal limits will improve Outcome: Not Progressing Goal: Will remain free from infection Outcome: Not Progressing Goal: Diagnostic test results will improve Outcome: Not Progressing Goal: Respiratory complications will improve Outcome: Not Progressing Goal: Cardiovascular complication will be avoided Outcome: Not Progressing   Problem: Activity: Goal: Risk for activity intolerance will decrease Outcome: Not Progressing   Problem: Nutrition: Goal: Adequate nutrition will be maintained Outcome: Not Progressing   Problem: Coping: Goal: Level of anxiety will decrease Outcome: Not Progressing   Problem: Elimination: Goal: Will not experience complications related to bowel motility Outcome: Not Progressing Goal: Will not experience complications related to urinary retention Outcome: Not Progressing   Problem: Pain Managment: Goal: General experience of comfort will improve and/or be controlled Outcome: Not Progressing   Problem: Safety: Goal: Ability to remain free from injury will improve Outcome: Not Progressing   Problem: Skin Integrity: Goal: Risk for impaired skin integrity will decrease Outcome: Not Progressing   Problem: Fluid Volume: Goal: Hemodynamic stability will improve Outcome: Not Progressing   Problem: Clinical Measurements: Goal: Diagnostic test results will improve Outcome: Not Progressing Goal: Signs and symptoms of infection will decrease Outcome: Not Progressing   Problem: Respiratory: Goal: Ability to maintain  adequate ventilation will improve Outcome: Not Progressing   Problem: Education: Goal: Ability to demonstrate management of disease process will improve Outcome: Not Progressing Goal: Ability to verbalize understanding of medication therapies will improve Outcome: Not Progressing Goal: Individualized Educational Video(s) Outcome: Not Progressing   Problem: Activity: Goal: Capacity to carry out activities will improve Outcome: Not Progressing   Problem: Cardiac: Goal: Ability to achieve and maintain adequate cardiopulmonary perfusion will improve Outcome: Not Progressing

## 2023-12-11 NOTE — Progress Notes (Signed)
 Pharmacy Antibiotic Note  Terrance Miller is a 89 y.o. male admitted on 12/08/2023 with sepsis.  Pharmacy has been consulted for Vancomycin  dosing. WBC WNL. Acute on chronic renal failure.   Plan: Vancomycin  750 given 12/4, next dose 12/6 -will plan on checking vanc level prior to redosing Cefepime  per MD F/U infectious work-up    Height: 5' 8 (172.7 cm) Weight: 82 kg (180 lb 12.4 oz) IBW/kg (Calculated) : 68.4  Temp (24hrs), Avg:98.6 F (37 C), Min:97.8 F (36.6 C), Max:99.1 F (37.3 C)  Recent Labs  Lab 12/08/23 0036 12/08/23 0209 12/08/23 0712 12/08/23 0922 12/09/23 0333 12/10/23 0523 12/11/23 0440  WBC 5.2  --   --   --  10.0  --  5.1  CREATININE 4.18*  --   --   --  4.63*  --  4.70*  LATICACIDVEN 3.3* 3.1* 2.2* 2.1*  --   --   --   VANCORANDOM  --   --   --   --   --  12  --     Estimated Creatinine Clearance: 9.5 mL/min (A) (by C-G formula based on SCr of 4.7 mg/dL (H)).    Allergies  Allergen Reactions   Clobetasol  Other (See Comments)    Unknown  No reaction listed on MAR from facility   Dempsey Blush PharmD., BCPS Clinical Pharmacist 12/11/2023 10:45 AM

## 2023-12-11 NOTE — Progress Notes (Signed)
 1758 patient BP dropping and heart rate is skipping beats daughter [name] called she is not coming to bedside her son is her at this time, [name] carter would just like to be updated if he passes.

## 2023-12-11 NOTE — Progress Notes (Signed)
 PROGRESS NOTE    Terrance Miller  FMW:[id number] DOB: [birth year] DOA: 12/08/2023 PCP: Cook, [name], DO   Chief Complaint  Patient presents with   Hypotension    Brief Hospital admission narrative:  As per H&P written by Dr. Manfred on 12/08/2023  Terrance Miller is a 89 y.o. male with medical history significant of hypertension, hyperlipidemia, MGUS, CKD stage III, diet-controlled diabetes mellitus type 2, thrombocytopenia, dementia, HFpEF, coronary disease who presents to the emergency department via EMS due to altered mental status.  She was noted with altered mental status for the SNF yesterday in the evening with heart rate in 30s and patient was hypotensive with SBP in the 70s.   Patient was admitted from 5/28 to 6/2 and presented with similar presentation where he had hypothermia and hypotension as well as C. difficile colitis.   ED course In the emergency department, he was hypothermic with temperature 86.53F, respiratory rate 17, pulse 27 bpm, BP 67/40, O2 sats was 88% on 6 LPM.  Workup in the ED showed macrocytic anemia with MCV of 103.8, hemoglobin 7.0, platelets 14.  BMP shows sodium of 148, potassium 4.8, chloride 117, bicarb 14 blood glucose 118, BUN 77, creatinine 4.18 calcium  8.4, albumin 3.4, AST 128, ALT 149, ALP 151, eGFR 13, anion gap 18 Chest x-ray showed stable cardiomegaly with low lung volumes with mild bibasilar atelectasis Patient was started on IV vancomycin  and cefepime .  IV NS 2 L was provided in the ED Discussion was held with daughter at bedside who wanted to try peripheral IV pressors, but does not want any more invasive procedures and was willing to make patient comfort care if patient does not respond to pressors and antibiotics.  She objected to catheterizing patient to obtain urinalysis.    Assessment & Plan: 1-septic shock - With concerns for UTI - Patient continued to demonstrate the need for peripheral pressors, continue to experience hypothermia, ongoing  hypoglycemia and inability to follow any commands or being able to maintain nutrition on his own. - Extensive discussion with family members at bedside complain overall poor prognosis. - For now continue current therapy without escalating management. - They have confirmed DNR/DNI. - Continue D5/half-normal saline and bicarbonate drip. - Given the fact that we have reached 3 days on peripheral pressors Levophed  will be discontinued.  Continue to follow patient response without this support.  2-lactic acidosis/high anion gap metabolic acidosis - Secondary to problem #1 - Up to 3.3 at time of admission - After fluid resuscitation down to 2.1. - Gap up to 18 at time of admission - As specified will continue fluid resuscitation.  3-acute metabolic encephalopathy in the setting of septic shock - Ongoing/unchanged - Continue supportive care. - Patient is not following any commands and is at the moment unresponsive.  4-thrombocytopenia platelets down to 14 - No overt bleeding - Family has expressed no wanting platelets or blood transfusion - Most likely associated with sepsis. - Continue avoiding heparin  products and use SCDs for DVT prophylaxis.  5-acute kidney injury on chronic kidney disease - Stage V at baseline  - Creatinine up to 4.8 - Per nursing report almost meeting criteria for anuria - Continue follow response. - Patient's BUN in the 88 range; not a candidate for dialysis.  6-chronic diastolic heart failure - Clinically dehydrated at the moment - In the setting of ongoing renal dysfunction and high risk for fluid overload.  7-mixed hyperlipidemia - Unable to take oral medications and currently with elevated transaminitis - Continue to  hold statin.  8-transaminitis - In the setting of septic shock/shock of liver - Continue fluid resuscitation and follow LFTs intermittently.  9-acute on chronic anemia  - No overt bleeding appreciated - Some component of hemodilution  most likely playing a role - After discussing with family members interested to transfuse 1 unit PRBCs - Continue to follow trend.  10-ethics/social - Goals of care and poor prognosis discussed with family members at bedside - For now continue current therapy without further escalation and maintain DNI/DNR status. - Anticipating transition to full comfort care in the next 24 hours with most likely hospital death. - I have discussed with patient's POA ([name]).  She understood inability to continue with peripheral pressure support.  Interested to transfuse 1 unit PRBC to stabilize hemoglobin level and assess response.  Herself and family coming to the hospital tomorrow to further decide future care.  DVT prophylaxis: SCDs Code Status: DNR/DNI Family Communication: Discussed with patient's daughters and son at bedside. Disposition:   Status is: Inpatient Remains inpatient appropriate because: Continue IV therapy.   Consultants:  Palliative care  Procedures: See below for x-ray reports.  Antimicrobials:  Vancomycin  and cefepime  in place.  Subjective: Patient's overall prognosis remain guarded; still responding just to painful stimuli and demonstrating inability to eat appropriately clear his airway secretions on his own.  Patient requiring warming blankets and dextrose  infusion to maintain sugar levels.  Objective: Vitals:   12/11/23 1515 12/11/23 1530 12/11/23 1532 12/11/23 1558  BP: (!) 112/54 (!) 100/46 (!) 107/55   Pulse: (!) 55 65 65   Resp: (!) 21 17 19    Temp:    97.6 F (36.4 C)  TempSrc:    Axillary  SpO2: 95% 98% 99%   Weight:      Height:        Intake/Output Summary (Last 24 hours) at 12/11/2023 1619 Last data filed at 12/11/2023 1600 Gross per 24 hour  Intake 2746.36 ml  Output 750 ml  Net 1996.36 ml   Filed Weights   12/08/23 0004 12/08/23 0550  Weight: 79.7 kg 82 kg    Examination: General exam: No following commands; responding just to painful  stimuli.  Currently off pressor support; requiring warming blankets and dextrose  infusion. Respiratory system: Good saturation on room air. Cardiovascular system: Rate controlled, no rubs, no gallops, no JVD. Gastrointestinal system: Abdomen is nondistended, soft and without guarding.  Positive bowel sounds. Central nervous system: Unable to properly assess secondary to current mental status.   Extremities: No cyanosis or clubbing; trace edema appreciated bilaterally.  SCDs in place. Skin: No petechiae. Psychiatry: Unable to properly assess secondary to current mental status.  Data Reviewed: I have personally reviewed following labs and imaging studies  CBC: Recent Labs  Lab 12/08/23 0036 12/09/23 0333 12/11/23 0440  WBC 5.2 10.0 5.1  NEUTROABS 4.7  --   --   HGB 7.0* 6.6* 6.2*  HCT 21.6* 20.0* 18.6*  MCV 103.8* 102.6* 101.1*  PLT 14* 18* 18*    Basic Metabolic Panel: Recent Labs  Lab 12/08/23 0036 12/08/23 0209 12/09/23 0333 12/11/23 0440  NA 148*  --  144 142  K 4.8  --  4.6 4.0  CL 117*  --  110 106  CO2 14*  --  18* 20*  GLUCOSE 118*  --  89 87  BUN 77*  --  80* 86*  CREATININE 4.18*  --  4.63* 4.70*  CALCIUM  8.4*  --  7.7* 7.5*  MG  --  2.8*  --   --   PHOS  --  6.4*  --   --     GFR: Estimated Creatinine Clearance: 9.5 mL/min (A) (by C-G formula based on SCr of 4.7 mg/dL (H)).  Liver Function Tests: Recent Labs  Lab 12/08/23 0036 12/09/23 0333  AST 128* 87*  ALT 149* 133*  ALKPHOS 151* 146*  BILITOT 0.8 0.7  PROT 6.3* 6.4*  ALBUMIN 3.4* 3.4*    CBG: Recent Labs  Lab 12/10/23 2350 12/11/23 0346 12/11/23 0726 12/11/23 1111 12/11/23 1132  GLUCAP 88 89 96 96 99     Recent Results (from the past 240 hours)  MRSA Next Gen by PCR, Nasal     Status: Abnormal   Collection Time: 12/08/23  6:00 AM   Specimen: Nasal Mucosa; Nasal Swab  Result Value Ref Range Status   MRSA by PCR Next Gen DETECTED (A) NOT DETECTED Final    Comment: CRITICAL  RESULT CALLED TO, READ BACK BY AND VERIFIED WITH: O PUCKETT AT 0855 ON 12.02.25 BY ADGER J         The GeneXpert MRSA Assay (FDA approved for NASAL specimens only), is one component of a comprehensive MRSA colonization surveillance program. It is not intended to diagnose MRSA infection nor to guide or monitor treatment for MRSA infections. Performed at [facility], [address], [place], [place] [address]   Urine Culture     Status: Abnormal   Collection Time: 12/08/23  2:32 PM   Specimen: Urine, Random  Result Value Ref Range Status   Specimen Description   Final    URINE, RANDOM Performed at [facility], [address] [place], [place] [address]    Special Requests   Final    NONE Reflexed from [id number] Performed at [facility], [address], [place], [place] [address]    Culture (A)  Final    <10,000 COLONIES/mL INSIGNIFICANT GROWTH Performed at [facility], [address], [place], [place] [address]    Report Status 12/09/2023 FINAL  Final    Radiology Studies: No results found.  Scheduled Meds:  sodium chloride    Intravenous Once   Chlorhexidine  Gluconate Cloth  6 each Topical Daily   mupirocin  ointment   Nasal BID   mouth rinse  15 mL Mouth Rinse 4 times per day   scopolamine   1 patch Transdermal Q72H   vancomycin  variable dose per unstable renal function (pharmacist dosing)   Does not apply See admin instructions   Continuous Infusions:  ceFEPime  (MAXIPIME ) IV Stopped (12/11/23 0441)   dextrose  5 % and 0.45 % NaCl     sodium bicarbonate  150 mEq in sterile water  1,150 mL infusion 50 mL/hr at 12/11/23 1600   vancomycin  Stopped (12/10/23 1225)     LOS: 3 days   CRITICAL CARE Performed by: Eric Nunnery   CRITICAL CARE Performed by: Eric Nunnery   Total critical care time: 50 minutes  Critical care time was exclusive of separately billable procedures and treating other patients.  Critical care was necessary to treat or prevent imminent or  life-threatening deterioration.  Critical care was time spent personally by me on the following activities: development of treatment plan with patient and/or surrogate as well as nursing, discussions with consultants, evaluation of patient's response to treatment, examination of patient, obtaining history from patient or surrogate, ordering and performing treatments and interventions, ordering and review of laboratory studies, ordering and review of radiographic studies, pulse oximetry and re-evaluation of patient's condition.  Eric Nunnery, MD [facility]  To contact the attending provider between 7A-7P or the covering provider during after hours 7P-7A, please log into the web site [email or web address] and access using universal [place] password for that web site. If you do not have the password, please call the hospital operator.  12/11/2023, 4:19 PM

## 2023-12-12 DIAGNOSIS — R001 Bradycardia, unspecified: Secondary | ICD-10-CM

## 2023-12-12 DIAGNOSIS — I959 Hypotension, unspecified: Secondary | ICD-10-CM

## 2023-12-12 DIAGNOSIS — A419 Sepsis, unspecified organism: Secondary | ICD-10-CM | POA: Diagnosis not present

## 2023-12-12 DIAGNOSIS — E860 Dehydration: Secondary | ICD-10-CM | POA: Diagnosis not present

## 2023-12-12 LAB — BASIC METABOLIC PANEL WITH GFR
Anion gap: 16 — ABNORMAL HIGH (ref 5–15)
BUN: 88 mg/dL — ABNORMAL HIGH (ref 8–23)
CO2: 23 mmol/L (ref 22–32)
Calcium: 7.6 mg/dL — ABNORMAL LOW (ref 8.9–10.3)
Chloride: 103 mmol/L (ref 98–111)
Creatinine, Ser: 4.79 mg/dL — ABNORMAL HIGH (ref 0.61–1.24)
GFR, Estimated: 11 mL/min — ABNORMAL LOW (ref >60)
Glucose, Bld: 100 mg/dL — ABNORMAL HIGH (ref 70–99)
Potassium: 3.6 mmol/L (ref 3.5–5.1)
Sodium: 142 mmol/L (ref 135–145)

## 2023-12-12 LAB — GLUCOSE, CAPILLARY
Glucose-Capillary: 107 mg/dL — ABNORMAL HIGH (ref 70–99)
Glucose-Capillary: 118 mg/dL — ABNORMAL HIGH (ref 70–99)
Glucose-Capillary: 120 mg/dL — ABNORMAL HIGH (ref 70–99)
Glucose-Capillary: 126 mg/dL — ABNORMAL HIGH (ref 70–99)
Glucose-Capillary: 128 mg/dL — ABNORMAL HIGH (ref 70–99)

## 2023-12-12 LAB — CBC
HCT: 17.5 % — ABNORMAL LOW (ref 39.0–52.0)
Hemoglobin: 5.9 g/dL — CL (ref 13.0–17.0)
MCH: 33.1 pg (ref 26.0–34.0)
MCHC: 33.7 g/dL (ref 30.0–36.0)
MCV: 98.3 fL (ref 80.0–100.0)
Platelets: 25 K/uL — CL (ref 150–400)
RBC: 1.78 MIL/uL — ABNORMAL LOW (ref 4.22–5.81)
RDW: 22.8 % — ABNORMAL HIGH (ref 11.5–15.5)
WBC: 4.6 K/uL (ref 4.0–10.5)
nRBC: 4.1 % — ABNORMAL HIGH (ref 0.0–0.2)

## 2023-12-12 MED ORDER — VANCOMYCIN HCL 750 MG/150ML IV SOLN: 750.0000 mg | INTRAVENOUS | Status: DC

## 2023-12-12 NOTE — Progress Notes (Signed)
 PROGRESS NOTE    Terrance Miller  FMW:[id number] DOB: 06/17/1928 DOA: 12/08/2023 PCP: Cook, [name], DO   Chief Complaint  Patient presents with   Hypotension    Brief Hospital admission narrative:  As per H&P written by Dr. Manfred on 12/08/2023  Terrance Miller is a 89 y.o. male with medical history significant of hypertension, hyperlipidemia, MGUS, CKD stage III, diet-controlled diabetes mellitus type 2, thrombocytopenia, dementia, HFpEF, coronary disease who presents to the emergency department via EMS due to altered mental status.  She was noted with altered mental status for the SNF yesterday in the evening with heart rate in 30s and patient was hypotensive with SBP in the 70s.   Patient was admitted from 5/28 to 6/2 and presented with similar presentation where he had hypothermia and hypotension as well as C. difficile colitis.   ED course In the emergency department, he was hypothermic with temperature 86.61F, respiratory rate 17, pulse 27 bpm, BP 67/40, O2 sats was 88% on 6 LPM.  Workup in the ED showed macrocytic anemia with MCV of 103.8, hemoglobin 7.0, platelets 14.  BMP shows sodium of 148, potassium 4.8, chloride 117, bicarb 14 blood glucose 118, BUN 77, creatinine 4.18 calcium  8.4, albumin 3.4, AST 128, ALT 149, ALP 151, eGFR 13, anion gap 18 Chest x-ray showed stable cardiomegaly with low lung volumes with mild bibasilar atelectasis Patient was started on IV vancomycin  and cefepime .  IV NS 2 L was provided in the ED Discussion was held with daughter at bedside who wanted to try peripheral IV pressors, but does not want any more invasive procedures and was willing to make patient comfort care if patient does not respond to pressors and antibiotics.  She objected to catheterizing patient to obtain urinalysis.    Assessment & Plan: 1-septic shock - With concerns for UTI - Patient continued to demonstrate the need for peripheral pressors, continue to experience hypothermia, ongoing  hypoglycemia and inability to follow any commands or being able to maintain nutrition on his own. - Extensive discussion with family members at bedside complain overall poor prognosis. - For now continue current therapy without escalating management. - They have confirmed DNR/DNI. - Continue D5/half-normal saline and bicarbonate drip. - Levophed  has been discontinued; vancomycin  also has been stopped. - Continue IV cefepime  at that time.  2-lactic acidosis/high anion gap metabolic acidosis - Secondary to problem #1 - Up to 3.3 at time of admission - After fluid resuscitation down to 2.1. - Gap up to 18 at time of admission - As specified will continue fluid resuscitation. - No further blood work anticipated.  3-acute metabolic encephalopathy in the setting of septic shock - Ongoing/unchanged - Continue supportive care. - Patient is not following any commands and is at the moment unresponsive.  4-thrombocytopenia platelets down to 14 - No overt bleeding - Family has expressed no wanting platelets or blood transfusion - Most likely associated with sepsis. - Continue avoiding heparin  products and use SCDs for DVT prophylaxis. - No further blood work anticipated.  5-acute kidney injury on chronic kidney disease - Stage V at baseline  - Creatinine up to 4.8 - Per nursing report almost meeting criteria for anuria - Continue follow response. - Patient's BUN in the 88 range; not a candidate for dialysis.  6-chronic diastolic heart failure - Clinically dehydrated at the moment - In the setting of ongoing renal dysfunction and high risk for fluid overload.  7-mixed hyperlipidemia - Unable to take oral medications and currently with elevated transaminitis -  Continue to hold statin.  8-transaminitis - In the setting of septic shock/shock of liver - Continue fluid resuscitation and follow LFTs intermittently.  9-acute on chronic anemia  - No overt bleeding appreciated - Some  component of hemodilution most likely playing a role - After discussing with family members interested to transfuse 1 unit PRBCs - Continue to follow trend.  10-ethics/social - Goals of care and poor prognosis discussed with family members at bedside - For now continue current therapy without further escalation and maintain DNI/DNR status. - Anticipating transition to full comfort care in the next 24 hours with most likely hospital death. - I have discussed with patient's POA ([name]).  She understood inability to continue with peripheral pressors support.  After further discussing just the day his condition decision made not to provide any blood transfusion. - Will discontinue vancomycin  in the setting of further worsening renal function. - No further blood work has been requested by family.  Okayed for 4 times a day blood sugar evaluation while still receiving gentle dextrose  infusion. - They would like to continue IV cefepime .  DVT prophylaxis: SCDs Code Status: DNR/DNI Family Communication: Discussed with patient's daughters and son at bedside. Disposition:   Status is: Inpatient Remains inpatient appropriate because: Continue IV therapy.   Consultants:  Palliative care  Procedures: See below for x-ray reports.  Antimicrobials:  Vancomycin  and cefepime  in place.  Subjective: Condition remains guarded and prognosis very poor.  Afebrile and chronically ill in appearance.  After discontinuation of pressor support patient's blood pressure dropped into the 70s to 80s with a MAP at time in the 50s.  There have been also some episodes of bradycardia appreciated.  Objective: Vitals:   12/12/23 1131 12/12/23 1200 12/12/23 1230 12/12/23 1300  BP: (!) 89/41 (!) 89/49 (!) 92/40 (!) 89/41  Pulse: (!) 53 (!) 56 62 (!) 52  Resp: 16 (!) 21 12 (!) 21  Temp: 98 F (36.7 C)     TempSrc: Rectal     SpO2: 98% 100% 100% 99%  Weight:      Height:        Intake/Output Summary (Last 24  hours) at 12/12/2023 1318 Last data filed at 12/12/2023 1300 Gross per 24 hour  Intake 2496.5 ml  Output 700 ml  Net 1796.5 ml   Filed Weights   12/08/23 0004 12/08/23 0550  Weight: 79.7 kg 82 kg    Examination: General exam: Chronically ill in appearance; afebrile.  Was intermittently able to wiggle his toes on voice commands.  Still demonstrating inability to keep his airways clear or safely have his diet advanced and tolerated. Respiratory system: Good saturation on room air; positive rhonchi's bilaterally. Cardiovascular system: Rate controlled, no rubs, no gallops, no JVD; per telemetry evaluation atrial fibrillation intermittently appreciated with some episodes of bradycardia. Gastrointestinal system: Abdomen is nondistended, soft and nontender.  Positive bowel sounds. Central nervous system: Unable to properly assess with current mentation. Extremities: No cyanosis or clubbing; trace to 1+ edema appreciated bilaterally (including upper extremities); chronic venous stasis dermatitis changes appreciated on his legs. Skin: No petechiae. Psychiatry: Unable to properly assess with current mentation.  Data Reviewed: I have personally reviewed following labs and imaging studies  CBC: Recent Labs  Lab 12/08/23 0036 12/09/23 0333 12/11/23 0440 12/12/23 0426  WBC 5.2 10.0 5.1 4.6  NEUTROABS 4.7  --   --   --   HGB 7.0* 6.6* 6.2* 5.9*  HCT 21.6* 20.0* 18.6* 17.5*  MCV 103.8* 102.6* 101.1*  98.3  PLT 14* 18* 18* 25*    Basic Metabolic Panel: Recent Labs  Lab 12/08/23 0036 12/08/23 0209 12/09/23 0333 12/11/23 0440 12/12/23 0426  NA 148*  --  144 142 142  K 4.8  --  4.6 4.0 3.6  CL 117*  --  110 106 103  CO2 14*  --  18* 20* 23  GLUCOSE 118*  --  89 87 100*  BUN 77*  --  80* 86* 88*  CREATININE 4.18*  --  4.63* 4.70* 4.79*  CALCIUM  8.4*  --  7.7* 7.5* 7.6*  MG  --  2.8*  --   --   --   PHOS  --  6.4*  --   --   --     GFR: Estimated Creatinine Clearance: 9.3 mL/min  (A) (by C-G formula based on SCr of 4.79 mg/dL (H)).  Liver Function Tests: Recent Labs  Lab 12/08/23 0036 12/09/23 0333  AST 128* 87*  ALT 149* 133*  ALKPHOS 151* 146*  BILITOT 0.8 0.7  PROT 6.3* 6.4*  ALBUMIN 3.4* 3.4*    CBG: Recent Labs  Lab 12/11/23 2039 12/11/23 2347 12/12/23 0406 12/12/23 0714 12/12/23 1132  GLUCAP 100* 102* 107* 118* 120*     Recent Results (from the past 240 hours)  MRSA Next Gen by PCR, Nasal     Status: Abnormal   Collection Time: 12/08/23  6:00 AM   Specimen: Nasal Mucosa; Nasal Swab  Result Value Ref Range Status   MRSA by PCR Next Gen DETECTED (A) NOT DETECTED Final    Comment: CRITICAL RESULT CALLED TO, READ BACK BY AND VERIFIED WITH: O PUCKETT AT 0855 ON 12.02.25 BY ADGER J         The GeneXpert MRSA Assay (FDA approved for NASAL specimens only), is one component of a comprehensive MRSA colonization surveillance program. It is not intended to diagnose MRSA infection nor to guide or monitor treatment for MRSA infections. Performed at [facility], [address] [place], [place] [address]   Urine Culture     Status: Abnormal   Collection Time: 12/08/23  2:32 PM   Specimen: Urine, Random  Result Value Ref Range Status   Specimen Description   Final    URINE, RANDOM Performed at [facility], [address], [place], [place] [address]    Special Requests   Final    NONE Reflexed from [id number] Performed at [facility], [address] [place], [place] [address]    Culture (A)  Final    <10,000 COLONIES/mL INSIGNIFICANT GROWTH Performed at [facility], [address], [place], [place] [address]    Report Status 12/09/2023 FINAL  Final    Radiology Studies: No results found.  Scheduled Meds:  Chlorhexidine  Gluconate Cloth  6 each Topical Daily   mupirocin  ointment   Nasal BID   mouth rinse  15 mL Mouth Rinse 4 times per day   scopolamine   1 patch Transdermal Q72H   Continuous Infusions:  ceFEPime  (MAXIPIME ) IV  Stopped (12/12/23 0550)   dextrose  5 % and 0.45 % NaCl 50 mL/hr at 12/12/23 1300   sodium bicarbonate  150 mEq in sterile water  1,150 mL infusion 50 mL/hr at 12/12/23 1300     LOS: 4 days   CRITICAL CARE Performed by: Eric Nunnery   Total critical care time: 50 minutes  Critical care time was exclusive of separately billable procedures and treating other patients.  Critical care was necessary to treat or prevent imminent  or life-threatening deterioration.  Critical care was time spent personally by me on the following activities: development of treatment plan with patient and/or surrogate as well as nursing, discussions with consultants, evaluation of patient's response to treatment, examination of patient, obtaining history from patient or surrogate, ordering and performing treatments and interventions, ordering and review of laboratory studies, ordering and review of radiographic studies, pulse oximetry and re-evaluation of patient's condition.      Eric Nunnery, MD [facility]  To contact the attending provider between 7A-7P or the covering provider during after hours 7P-7A, please log into the web site [email or web address] and access using universal [place] password for that web site. If you do not have the password, please call the hospital operator.  12/12/2023, 1:18 PM

## 2023-12-12 NOTE — Plan of Care (Signed)
  Problem: Education: Goal: Knowledge of General Education information will improve Description: Including pain rating scale, medication(s)/side effects and non-pharmacologic comfort measures Outcome: Not Progressing   Problem: Health Behavior/Discharge Planning: Goal: Ability to manage health-related needs will improve Outcome: Not Progressing   Problem: Clinical Measurements: Goal: Ability to maintain clinical measurements within normal limits will improve Outcome: Not Progressing Goal: Will remain free from infection Outcome: Not Progressing Goal: Diagnostic test results will improve Outcome: Not Progressing Goal: Respiratory complications will improve Outcome: Not Progressing Goal: Cardiovascular complication will be avoided Outcome: Not Progressing   Problem: Activity: Goal: Risk for activity intolerance will decrease Outcome: Not Progressing   Problem: Nutrition: Goal: Adequate nutrition will be maintained Outcome: Not Progressing   Problem: Coping: Goal: Level of anxiety will decrease Outcome: Not Progressing   Problem: Elimination: Goal: Will not experience complications related to bowel motility Outcome: Not Progressing Goal: Will not experience complications related to urinary retention Outcome: Not Progressing   Problem: Pain Managment: Goal: General experience of comfort will improve and/or be controlled Outcome: Not Progressing   Problem: Safety: Goal: Ability to remain free from injury will improve Outcome: Not Progressing   Problem: Skin Integrity: Goal: Risk for impaired skin integrity will decrease Outcome: Not Progressing   Problem: Fluid Volume: Goal: Hemodynamic stability will improve Outcome: Not Progressing   Problem: Clinical Measurements: Goal: Diagnostic test results will improve Outcome: Not Progressing Goal: Signs and symptoms of infection will decrease Outcome: Not Progressing   Problem: Respiratory: Goal: Ability to maintain  adequate ventilation will improve Outcome: Not Progressing   Problem: Education: Goal: Ability to demonstrate management of disease process will improve Outcome: Not Progressing Goal: Ability to verbalize understanding of medication therapies will improve Outcome: Not Progressing Goal: Individualized Educational Video(s) Outcome: Not Progressing   Problem: Activity: Goal: Capacity to carry out activities will improve Outcome: Not Progressing   Problem: Cardiac: Goal: Ability to achieve and maintain adequate cardiopulmonary perfusion will improve Outcome: Not Progressing

## 2023-12-13 DIAGNOSIS — A419 Sepsis, unspecified organism: Secondary | ICD-10-CM | POA: Diagnosis not present

## 2023-12-13 DIAGNOSIS — R6521 Severe sepsis with septic shock: Secondary | ICD-10-CM | POA: Diagnosis not present

## 2023-12-13 LAB — GLUCOSE, CAPILLARY
Glucose-Capillary: 118 mg/dL — ABNORMAL HIGH (ref 70–99)
Glucose-Capillary: 128 mg/dL — ABNORMAL HIGH (ref 70–99)
Glucose-Capillary: 86 mg/dL (ref 70–99)
Glucose-Capillary: 96 mg/dL (ref 70–99)

## 2023-12-13 NOTE — Progress Notes (Signed)
 PROGRESS NOTE    Terrance Miller  FMW:[id number] DOB: 1928-06-22 DOA: 12/08/2023 PCP: Cook, [name], DO   Chief Complaint  Patient presents with   Hypotension    Brief Hospital admission narrative:  As per H&P written by Dr. Manfred on 12/08/2023  Terrance Miller is a 89 y.o. male with medical history significant of hypertension, hyperlipidemia, MGUS, CKD stage III, diet-controlled diabetes mellitus type 2, thrombocytopenia, dementia, HFpEF, coronary disease who presents to the emergency department via EMS due to altered mental status.  She was noted with altered mental status for the SNF yesterday in the evening with heart rate in 30s and patient was hypotensive with SBP in the 70s.   Patient was admitted from 5/28 to 6/2 and presented with similar presentation where he had hypothermia and hypotension as well as C. difficile colitis.   ED course In the emergency department, he was hypothermic with temperature 86.74F, respiratory rate 17, pulse 27 bpm, BP 67/40, O2 sats was 88% on 6 LPM.  Workup in the ED showed macrocytic anemia with MCV of 103.8, hemoglobin 7.0, platelets 14.  BMP shows sodium of 148, potassium 4.8, chloride 117, bicarb 14 blood glucose 118, BUN 77, creatinine 4.18 calcium  8.4, albumin 3.4, AST 128, ALT 149, ALP 151, eGFR 13, anion gap 18 Chest x-ray showed stable cardiomegaly with low lung volumes with mild bibasilar atelectasis Patient was started on IV vancomycin  and cefepime .  IV NS 2 L was provided in the ED Discussion was held with daughter at bedside who wanted to try peripheral IV pressors, but does not want any more invasive procedures and was willing to make patient comfort care if patient does not respond to pressors and antibiotics.  She objected to catheterizing patient to obtain urinalysis.    Assessment & Plan: 1-septic shock - With concerns for UTI - Patient continued to demonstrate the need for peripheral pressors, continue to experience hypothermia, ongoing  hypoglycemia and inability to follow any commands or being able to maintain nutrition on his own. - Extensive discussion with family members at bedside complain overall poor prognosis. - For now continue current therapy without escalating management. - They have confirmed DNR/DNI. - Continue D5/half-normal saline and bicarbonate drip. - Levophed  has been discontinued; vancomycin  also has been stopped. - Continue IV cefepime  at that time.  2-lactic acidosis/high anion gap metabolic acidosis - Secondary to problem #1 - Up to 3.3 at time of admission - After fluid resuscitation down to 2.1. - Gap up to 18 at time of admission - As specified will continue fluid resuscitation. - No further blood work anticipated.  3-acute metabolic encephalopathy in the setting of septic shock - Ongoing/unchanged - Continue supportive care. - Patient is not following any commands and is at the moment unresponsive.  4-thrombocytopenia platelets down to 14 - No overt bleeding - Family has expressed no wanting platelets or blood transfusion - Most likely associated with sepsis. - Continue avoiding heparin  products and use SCDs for DVT prophylaxis. - No further blood work anticipated.  5-acute kidney injury on chronic kidney disease - Stage V at baseline  - Creatinine up to 4.8 - Per nursing report almost meeting criteria for anuria - Continue follow response. - Patient's BUN in the 88 range; not a candidate for dialysis.  6-chronic diastolic heart failure - Clinically dehydrated at the moment - In the setting of ongoing renal dysfunction and high risk for fluid overload.  7-mixed hyperlipidemia - Unable to take oral medications and currently with elevated transaminitis -  Continue to hold statin.  8-transaminitis - In the setting of septic shock/shock of liver - Continue fluid resuscitation and follow LFTs intermittently.  9-acute on chronic anemia  - No overt bleeding appreciated - Some  component of hemodilution most likely playing a role - After discussing with family members interested to transfuse 1 unit PRBCs - Continue to follow trend.  10-ethics/social - Goals of care and poor prognosis discussed with family members at bedside - For now continue current therapy without further escalation and maintain DNI/DNR status. - Anticipating transition to full comfort care in the next 24 hours with most likely hospital death. - I have discussed with patient's POA (Terrance Miller).  She understood inability to continue with peripheral pressors support.  After further discussing just the day his condition decision made not to provide any blood transfusion. - Will discontinue vancomycin  in the setting of further worsening renal function. - No further blood work has been requested by family.  Okayed for 4 times a day blood sugar evaluation while still receiving gentle dextrose  infusion. - They would like to continue IV cefepime  for now. - Speech therapy has been requested. - Follow further discussion with palliative care.  DVT prophylaxis: SCDs Code Status: DNR/DNI Family Communication: Discussed with patient's daughters and son at bedside. Disposition:   Status is: Inpatient Remains inpatient appropriate because: Continue IV therapy.   Consultants:  Palliative care  Procedures: See below for x-ray reports.  Antimicrobials:  Vancomycin  and cefepime  in place.  Subjective: Patient condition remains guarded and with poor prognosis; vital signs has stabilized for the moment.  There has been some improvement in his urine output.  Unable to protect his airways/clear secretions.   Objective: Vitals:   12/13/23 1200 12/13/23 1300 12/13/23 1400 12/13/23 1500  BP: 96/76 (!) 102/47 (!) 102/37 (!) 102/48  Pulse: (!) 52 (!) 49 (!) 143 (!) 54  Resp: 13 19 14 18   Temp:      TempSrc:      SpO2: 100% 100% 98% 100%  Weight:      Height:        Intake/Output Summary (Last 24 hours)  at 12/13/2023 1551 Last data filed at 12/13/2023 1300 Gross per 24 hour  Intake 1374.21 ml  Output 250 ml  Net 1124.21 ml   Filed Weights   12/08/23 0004 12/08/23 0550 12/13/23 0400  Weight: 79.7 kg 82 kg 89.2 kg    Examination: General exam: Alert, awake, able to follow commands intermittently.  Chronically ill in appearance and demonstrating inability to keep his airways clear from secretions or to swallow safely. Respiratory system: Positive scattered rhonchi; no using accessory muscles. Cardiovascular system: Rate controlled, no rubs, no gallops, no JVD. Gastrointestinal system: Abdomen is nondistended, soft and nontender. No organomegaly or masses felt. Normal bowel sounds heard. Central nervous system: Unable to reliably assess with current mentation. Extremities: No cyanosis or clubbing; 1+ edema appreciated bilaterally (upper and  lower extremities). Skin: No petechiae. Psychiatry: Unable to reliably assess with current mentation.  Data Reviewed: I have personally reviewed following labs and imaging studies  CBC: Recent Labs  Lab 12/08/23 0036 12/09/23 0333 12/11/23 0440 12/12/23 0426  WBC 5.2 10.0 5.1 4.6  NEUTROABS 4.7  --   --   --   HGB 7.0* 6.6* 6.2* 5.9*  HCT 21.6* 20.0* 18.6* 17.5*  MCV 103.8* 102.6* 101.1* 98.3  PLT 14* 18* 18* 25*    Basic Metabolic Panel: Recent Labs  Lab 12/08/23 0036 12/08/23 0209 12/09/23 0333 12/11/23  0440 12/12/23 0426  NA 148*  --  144 142 142  K 4.8  --  4.6 4.0 3.6  CL 117*  --  110 106 103  CO2 14*  --  18* 20* 23  GLUCOSE 118*  --  89 87 100*  BUN 77*  --  80* 86* 88*  CREATININE 4.18*  --  4.63* 4.70* 4.79*  CALCIUM  8.4*  --  7.7* 7.5* 7.6*  MG  --  2.8*  --   --   --   PHOS  --  6.4*  --   --   --     GFR: Estimated Creatinine Clearance: 10.5 mL/min (A) (by C-G formula based on SCr of 4.79 mg/dL (H)).  Liver Function Tests: Recent Labs  Lab 12/08/23 0036 12/09/23 0333  AST 128* 87*  ALT 149* 133*   ALKPHOS 151* 146*  BILITOT 0.8 0.7  PROT 6.3* 6.4*  ALBUMIN 3.4* 3.4*    CBG: Recent Labs  Lab 12/12/23 1606 12/12/23 2025 12/13/23 0007 12/13/23 0448 12/13/23 1117  GLUCAP 126* 128* 128* 118* 96     Recent Results (from the past 240 hours)  MRSA Next Gen by PCR, Nasal     Status: Abnormal   Collection Time: 12/08/23  6:00 AM   Specimen: Nasal Mucosa; Nasal Swab  Result Value Ref Range Status   MRSA by PCR Next Gen DETECTED (A) NOT DETECTED Final    Comment: CRITICAL RESULT CALLED TO, READ BACK BY AND VERIFIED WITH: O PUCKETT AT 0855 ON 12.02.25 BY ADGER J         The GeneXpert MRSA Assay (FDA approved for NASAL specimens only), is one component of a comprehensive MRSA colonization surveillance program. It is not intended to diagnose MRSA infection nor to guide or monitor treatment for MRSA infections. Performed at [facility], [address], [place], [place] [address]   Urine Culture     Status: Abnormal   Collection Time: 12/08/23  2:32 PM   Specimen: Urine, Random  Result Value Ref Range Status   Specimen Description   Final    URINE, RANDOM Performed at [facility], [address], [place], [place] [address]    Special Requests   Final    NONE Reflexed from [id number] Performed at [facility], [address], [place], [place] [address]    Culture (A)  Final    <10,000 COLONIES/mL INSIGNIFICANT GROWTH Performed at [facility], [address], [place], [place] [address]    Report Status 12/09/2023 FINAL  Final    Radiology Studies: No results found.  Scheduled Meds:  Chlorhexidine  Gluconate Cloth  6 each Topical Daily   mupirocin  ointment   Nasal BID   mouth rinse  15 mL Mouth Rinse 4 times per day   scopolamine   1 patch Transdermal Q72H   Continuous Infusions:  ceFEPime  (MAXIPIME ) IV Stopped (12/13/23 0556)   sodium bicarbonate  150 mEq in sterile water  1,150 mL infusion 50 mL/hr at 12/13/23 0625     LOS: 5 days   Time 50  minutes   Eric Nunnery, MD [facility]  To contact the attending provider between 7A-7P or the covering provider during after hours 7P-7A, please log into the web site [email or web address] and access using universal [place] password for that web site. If you do not have the password, please call the hospital operator.  12/13/2023, 3:51 PM

## 2023-12-14 LAB — GLUCOSE, CAPILLARY
Glucose-Capillary: 101 mg/dL — ABNORMAL HIGH (ref 70–99)
Glucose-Capillary: 64 mg/dL — ABNORMAL LOW (ref 70–99)
Glucose-Capillary: 65 mg/dL — ABNORMAL LOW (ref 70–99)
Glucose-Capillary: 75 mg/dL (ref 70–99)
Glucose-Capillary: 77 mg/dL (ref 70–99)
Glucose-Capillary: 78 mg/dL (ref 70–99)
Glucose-Capillary: 81 mg/dL (ref 70–99)

## 2023-12-14 MED ORDER — DEXTROSE 50 % IV SOLN
12.5000 g | INTRAVENOUS | Status: AC
  Administered 2023-12-14: 12.5 g via INTRAVENOUS

## 2023-12-14 MED ORDER — DEXTROSE 50 % IV SOLN
INTRAVENOUS | Status: AC
  Filled 2023-12-14: qty 50

## 2023-12-14 NOTE — Progress Notes (Signed)
 Hypoglycemic Event  CBG: 64  Treatment: D50 25 mL (12.5 gm)  Symptoms: None  Follow-up CBG: Time:0800 CBG Result:101  Possible Reasons for Event: Inadequate meal intake  Comments/MD notified:C.Shona Bring MALVA Linger

## 2023-12-14 NOTE — Consult Note (Signed)
 Consultation Note Date: 12/14/2023   Patient Name: Terrance Miller  DOB: 26-Sep-1928  MRN: [id number]  Age / Sex: 89 y.o., male  PCP: Cook, [name], DO Referring Physician: Ricky Fines, MD  Reason for Consultation:  goals of care  HPI/Patient Profile: 89 y.o. male  with past medical history of dementia, HTN, HLD, CKDIV, DM2 admitted on 12/08/2023 with septic shock due to UTI- required pressors but they are off now, AKI. Mental status is recovering poorly. Palliative medicine consulted for GOC.    Primary Decision Maker NEXT OF KIN  Discussion: Chart reviewed including labs, progress notes, imaging from this and previous encounters.  Discussed case with attending Dr. Ricky and patient's bedside RN.  Labs reviewed- last Cr on 12/6 was 4.79 and trending up. Hgb on 12/6 was 5.9. Per discussion with Dr. Ricky- family declined transfusion or further labs, agreed to finger glucose sticks.  Has only had glucose sticks since 12/6. He is currently still receiving antibiotic and IV fluids.  He is not eating or drinking on his own.  On evaluation, patient has eyes open, staring at ceiling. He doesn't respond to my voice, doesn't track me. He is moaning.  I called patient's daughter, [name] for discussion regarding goals of care.  [name] described Ronny as someone who is very humble. 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  Prior to admission he was able to speak and eat on his own.  Attempted to discuss Bland's current acute on chronic illness and goals of care.  [name] shared her understanding that patient is stable- to her meaning he is doing well and/or improving. She continues to hope that patient will improve to a point where he can be awake, talk, and eat and drink on his own. She notes that initially she was told by ED doctor that patient was at end of life and decision was made for  comfort- however, when patient was admitted another doctor recommended trial of IV fluids, antibiotics and IV pressors.  I reviewed patient's hospitalizations and current interventions.  Discussed that last labs drawn were on 12/6 and showed very unstable patient with severe hemoglobin and acute kidney dysfunction. I reviewed her discussion with attending MD to not proceed with transfusion, and to not draw further labwork.  [name] was confused by this stating that she was told that labs would only be drawn twice a day. I shared that he is having his finger stuck for blood sugar checks twice a day, however, this is not lab work that checks his kidney function and blood counts. It appears per chart and MD note on 12/5 that patient received one unit of blood on 12/5. However, Hgb was worse on 12/6 after transfusion.  On 12/6 family decided no further transfusion per attending note. This was not [name]'s understanding. She was under the impression he would continue to receive transfusions.  I shared my concern that given patient's poor mental status that he is not recovering and potentially is at end of life.  [name]  was surprised by this information. She felt it was in conflict with information she had discussed with RN and attending MD.  She requested a call from attending MD and declined to speak further with me.     SUMMARY OF RECOMMENDATIONS -Septic shock with acute kidney injury, severe anemia of unknown origin, encephalopathy- daughter desires continued medical interventions at this time, she requested phone call from attending MD and declined to speak further with me until after she speaks with MD- message forwarded to Dr. Ricky to request phone call to daughter    Code Status/Advance Care Planning:   Code Status: Limited: Do not attempt resuscitation (DNR) -DNR-LIMITED -Do Not Intubate/DNI     Prognosis:   Unable to determine  Discharge Planning: To Be Determined  Primary  Diagnoses: Present on Admission:  Septic shock (HCC)   Review of Systems  Unable to perform ROS: Mental status change    Physical Exam Vitals and nursing note reviewed.  Constitutional:      Appearance: He is ill-appearing.  Cardiovascular:     Rate and Rhythm: Bradycardia present.  Pulmonary:     Effort: Pulmonary effort is normal.  Neurological:     Comments: Awake, but does not respond     Vital Signs: BP (!) 143/68   Pulse (!) 55   Temp (!) 97 F (36.1 C) (Axillary)   Resp 20   Ht 5' 8 (1.727 m)   Wt 89.2 kg   SpO2 99%   BMI 29.90 kg/m  Pain Scale: PAINAD POSS *See Group Information*: 2-Acceptable,Slightly drowsy, easily aroused Pain Score: Asleep   SpO2: SpO2: 99 % O2 Device:SpO2: 99 % O2 Flow Rate: .O2 Flow Rate (L/min): 4 L/min  IO: Intake/output summary:  Intake/Output Summary (Last 24 hours) at 12/14/2023 1100 Last data filed at 12/13/2023 1800 Gross per 24 hour  Intake 575.02 ml  Output 600 ml  Net -24.98 ml    LBM: Last BM Date : 12/11/23 Baseline Weight: Weight: 79.7 kg Most recent weight: Weight: 89.2 kg       Thank you for this consult. Palliative medicine will continue to follow and assist as needed.  Time Total: 90 minutes Signed by: Cassondra Stain, AGNP-C Palliative Medicine  Time includes:   Preparing to see the patient (e.g., review of tests) Obtaining and/or reviewing separately obtained history Performing a medically necessary appropriate examination and/or evaluation Counseling and educating the patient/family/caregiver Ordering medications, tests, or procedures Referring and communicating with other health care professionals (when not reported separately) Documenting clinical information in the electronic or other health record Independently interpreting results (not reported separately) and communicating results to the patient/family/caregiver Care coordination (not reported separately) Clinical documentation   Please  contact Palliative Medicine Team phone at [phone] for questions and concerns.  For individual provider: See Tracey

## 2023-12-14 NOTE — Plan of Care (Signed)
  Problem: Education: Goal: Knowledge of General Education information will improve Description: Including pain rating scale, medication(s)/side effects and non-pharmacologic comfort measures Outcome: Not Progressing   Problem: Health Behavior/Discharge Planning: Goal: Ability to manage health-related needs will improve Outcome: Not Progressing   Problem: Clinical Measurements: Goal: Ability to maintain clinical measurements within normal limits will improve Outcome: Not Progressing Goal: Will remain free from infection Outcome: Not Progressing Goal: Diagnostic test results will improve Outcome: Not Progressing Goal: Respiratory complications will improve Outcome: Not Progressing Goal: Cardiovascular complication will be avoided Outcome: Not Progressing   Problem: Activity: Goal: Risk for activity intolerance will decrease Outcome: Not Progressing   Problem: Nutrition: Goal: Adequate nutrition will be maintained Outcome: Not Progressing   Problem: Coping: Goal: Level of anxiety will decrease Outcome: Not Progressing   Problem: Elimination: Goal: Will not experience complications related to bowel motility Outcome: Not Progressing Goal: Will not experience complications related to urinary retention Outcome: Not Progressing   Problem: Pain Managment: Goal: General experience of comfort will improve and/or be controlled Outcome: Not Progressing   Problem: Safety: Goal: Ability to remain free from injury will improve Outcome: Not Progressing   Problem: Skin Integrity: Goal: Risk for impaired skin integrity will decrease Outcome: Not Progressing   Problem: Fluid Volume: Goal: Hemodynamic stability will improve Outcome: Not Progressing   Problem: Clinical Measurements: Goal: Diagnostic test results will improve Outcome: Not Progressing Goal: Signs and symptoms of infection will decrease Outcome: Not Progressing   Problem: Respiratory: Goal: Ability to maintain  adequate ventilation will improve Outcome: Not Progressing   Problem: Education: Goal: Ability to demonstrate management of disease process will improve Outcome: Not Progressing Goal: Ability to verbalize understanding of medication therapies will improve Outcome: Not Progressing Goal: Individualized Educational Video(s) Outcome: Not Progressing   Problem: Activity: Goal: Capacity to carry out activities will improve Outcome: Not Progressing   Problem: Cardiac: Goal: Ability to achieve and maintain adequate cardiopulmonary perfusion will improve Outcome: Not Progressing

## 2023-12-14 NOTE — Progress Notes (Signed)
 PROGRESS NOTE    Terrance Miller  FMW:[id number] DOB: 1928-09-14 DOA: 12/08/2023 PCP: Cook, [name], DO   Chief Complaint  Patient presents with   Hypotension    Brief Hospital admission narrative:  As per H&P written by Dr. Manfred on 12/08/2023  Terrance Miller is a 89 y.o. male with medical history significant of hypertension, hyperlipidemia, MGUS, CKD stage III, diet-controlled diabetes mellitus type 2, thrombocytopenia, dementia, HFpEF, coronary disease who presents to the emergency department via EMS due to altered mental status.  She was noted with altered mental status for the SNF yesterday in the evening with heart rate in 30s and patient was hypotensive with SBP in the 70s.   Patient was admitted from 5/28 to 6/2 and presented with similar presentation where he had hypothermia and hypotension as well as C. difficile colitis.   ED course In the emergency department, he was hypothermic with temperature 86.26F, respiratory rate 17, pulse 27 bpm, BP 67/40, O2 sats was 88% on 6 LPM.  Workup in the ED showed macrocytic anemia with MCV of 103.8, hemoglobin 7.0, platelets 14.  BMP shows sodium of 148, potassium 4.8, chloride 117, bicarb 14 blood glucose 118, BUN 77, creatinine 4.18 calcium  8.4, albumin 3.4, AST 128, ALT 149, ALP 151, eGFR 13, anion gap 18 Chest x-ray showed stable cardiomegaly with low lung volumes with mild bibasilar atelectasis Patient was started on IV vancomycin  and cefepime .  IV NS 2 L was provided in the ED Discussion was held with daughter at bedside who wanted to try peripheral IV pressors, but does not want any more invasive procedures and was willing to make patient comfort care if patient does not respond to pressors and antibiotics.  She objected to catheterizing patient to obtain urinalysis.    Assessment & Plan: 1-septic shock - With concerns for UTI - Patient continued to demonstrate the need for peripheral pressors, continue to experience hypothermia, ongoing  hypoglycemia and inability to follow any commands or being able to maintain nutrition on his own. - Extensive discussion with family members at bedside complain overall poor prognosis. - For now continue current therapy without escalating management. - They have confirmed DNR/DNI. - Continue D5/half-normal saline and bicarbonate drip. - Levophed  has been discontinued; vancomycin  also has been stopped. - Continue IV cefepime  at this time.  2-lactic acidosis/high anion gap metabolic acidosis - Secondary to problem #1 - Up to 3.3 at time of admission - After fluid resuscitation down to 2.1. - Gap up to 18 at time of admission - Planning to repeat blood work in a.m. to further assess patient's stability and determine if there is any further stabilization that can be provided.  3-acute metabolic encephalopathy in the setting of septic shock - Some improvement appreciated in patient's mentation. - Continue supportive care.  4-thrombocytopenia platelets down to 14 - No overt bleeding - Family has expressed no wanting platelets or blood transfusion - Most likely associated with sepsis. - Continue avoiding heparin  products and use SCDs for DVT prophylaxis. - After discussing with family we will check CBC in AM.  5-acute kidney injury on chronic kidney disease - Stage V at baseline  - Creatinine up to 4.8 - Per nursing report almost meeting criteria for anuria - Continue follow response. - Patient's BUN in the 88 range; not a candidate for dialysis.  6-chronic diastolic heart failure - Clinically dehydrated at the moment - In the setting of ongoing renal dysfunction and high risk for fluid overload.  7-mixed hyperlipidemia - Unable  to take oral medications and currently with elevated transaminitis - Continue to hold statin.  8-transaminitis - In the setting of septic shock/shock of liver - Continue fluid resuscitation and follow LFTs intermittently.  9-acute on chronic anemia  -  No overt bleeding appreciated - Some component of hemodilution most likely playing a role - After discussing with family members interested to transfuse 1 unit PRBCs - Continue to follow trend.  10-ethics/social - Goals of care and poor prognosis discussed with family members at bedside - For now continue current therapy without further escalation and maintain DNI/DNR status. - Anticipating transition to full comfort care in the next 24 hours with most likely hospital death. - I have discussed with patient's POA ([name]).  She understood inability to continue with peripheral pressors support.  After further discussing just the day his condition decision made not to provide any blood transfusion. - Will discontinue vancomycin  in the setting of further worsening renal function. - After discussing with family today they would like to reassess blood work in the morning and try to replete/transfuse if needed to further assist stabilizing patient. - They would like to continue IV cefepime  for now. - Speech therapy has been requested; patient remain at high risk for aspiration and was not able to properly participate on today's assessment by speech therapy. - Follow further discussion with palliative care and with family..  DVT prophylaxis: SCDs Code Status: DNR/DNI Family Communication: Discussed with patient's daughters and son at bedside. Disposition:   Status is: Inpatient Remains inpatient appropriate because: Continue IV therapy.   Consultants:  Palliative care  Procedures: See below for x-ray reports.  Antimicrobials:  Vancomycin  has been discontinued Cefepime   Subjective: Patient is alert/awake intermittently and able to answer simple questions and follow some commands.  He continues to demonstrate difficulty clearing his airways from secretions and is at high risk for aspiration.  Overall condition remains guarded.  Objective: Vitals:   12/14/23 1400 12/14/23 1500 12/14/23  1600 12/14/23 1700  BP: (!) 88/44 (!) 103/44 (!) 85/37 (!) 90/50  Pulse:  (!) 52 (!) 51 (!) 38  Resp: 10 14 17 16   Temp:   (!) 97.4 F (36.3 C)   TempSrc:   Axillary   SpO2:  (!) 85% 97% 96%  Weight:      Height:        Intake/Output Summary (Last 24 hours) at 12/14/2023 1828 Last data filed at 12/14/2023 1620 Gross per 24 hour  Intake 100 ml  Output 500 ml  Net -400 ml   Filed Weights   12/08/23 0004 12/08/23 0550 12/13/23 0400  Weight: 79.7 kg 82 kg 89.2 kg    Examination: General exam: Awake, able to follow simple commands intermittently and answer yes or no questions.  Still demonstrating difficulties clearing airways secretions and is having difficulties with oral intake. Respiratory system: Good saturation on room air. Cardiovascular system: Rate controlled, no rubs, no gallops, no JVD. Gastrointestinal system: Abdomen is nondistended, soft and nontender.  Positive bowel sounds. Central nervous system: Limited examination with current mentation; but able to wiggle toes on command and squeeze my fingers. Extremities/skin: No cyanosis or clubbing; bilateral chronic stasis dermatitis changes appreciated on exam.  1+ edema.  No petechiae; no open wounds. Psychiatry: Judgement and insight appear impaired with current mentation; flat affect.  Data Reviewed: I have personally reviewed following labs and imaging studies  CBC: Recent Labs  Lab 12/08/23 0036 12/09/23 0333 12/11/23 0440 12/12/23 0426  WBC 5.2 10.0 5.1  4.6  NEUTROABS 4.7  --   --   --   HGB 7.0* 6.6* 6.2* 5.9*  HCT 21.6* 20.0* 18.6* 17.5*  MCV 103.8* 102.6* 101.1* 98.3  PLT 14* 18* 18* 25*    Basic Metabolic Panel: Recent Labs  Lab 12/08/23 0036 12/08/23 0209 12/09/23 0333 12/11/23 0440 12/12/23 0426  NA 148*  --  144 142 142  K 4.8  --  4.6 4.0 3.6  CL 117*  --  110 106 103  CO2 14*  --  18* 20* 23  GLUCOSE 118*  --  89 87 100*  BUN 77*  --  80* 86* 88*  CREATININE 4.18*  --  4.63* 4.70* 4.79*   CALCIUM  8.4*  --  7.7* 7.5* 7.6*  MG  --  2.8*  --   --   --   PHOS  --  6.4*  --   --   --     GFR: Estimated Creatinine Clearance: 10.5 mL/min (A) (by C-G formula based on SCr of 4.79 mg/dL (H)).  Liver Function Tests: Recent Labs  Lab 12/08/23 0036 12/09/23 0333  AST 128* 87*  ALT 149* 133*  ALKPHOS 151* 146*  BILITOT 0.8 0.7  PROT 6.3* 6.4*  ALBUMIN 3.4* 3.4*    CBG: Recent Labs  Lab 12/13/23 1759 12/14/23 0003 12/14/23 0616 12/14/23 1127 12/14/23 1604  GLUCAP 86 78 77 75 65*     Recent Results (from the past 240 hours)  MRSA Next Gen by PCR, Nasal     Status: Abnormal   Collection Time: 12/08/23  6:00 AM   Specimen: Nasal Mucosa; Nasal Swab  Result Value Ref Range Status   MRSA by PCR Next Gen DETECTED (A) NOT DETECTED Final    Comment: CRITICAL RESULT CALLED TO, READ BACK BY AND VERIFIED WITH: O PUCKETT AT 0855 ON 12.02.25 BY ADGER J         The GeneXpert MRSA Assay (FDA approved for NASAL specimens only), is one component of a comprehensive MRSA colonization surveillance program. It is not intended to diagnose MRSA infection nor to guide or monitor treatment for MRSA infections. Performed at [facility], [address], [place], [place] [address]   Urine Culture     Status: Abnormal   Collection Time: 12/08/23  2:32 PM   Specimen: Urine, Random  Result Value Ref Range Status   Specimen Description   Final    URINE, RANDOM Performed at [facility], [address], [place], [place] [address]    Special Requests   Final    NONE Reflexed from [id number] Performed at [facility], [address] [place], [place] [address]    Culture (A)  Final    <10,000 COLONIES/mL INSIGNIFICANT GROWTH Performed at [facility], [address], [place], [place] [address]    Report Status 12/09/2023 FINAL  Final    Radiology Studies: No results found.  Scheduled Meds:  Chlorhexidine  Gluconate Cloth  6 each Topical Daily   mupirocin  ointment   Nasal BID    mouth rinse  15 mL Mouth Rinse 4 times per day   scopolamine   1 patch Transdermal Q72H   Continuous Infusions:  ceFEPime  (MAXIPIME ) IV Stopped (12/14/23 0538)   sodium bicarbonate  150 mEq in sterile water  1,150 mL infusion 50 mL/hr at 12/13/23 2121     LOS: 6 days   Time 50 minutes   Eric Nunnery, MD [facility]  To contact the attending provider between 7A-7P or the covering provider  during after hours 7P-7A, please log into the web site [email or web address] and access using universal Ward password for that web site. If you do not have the password, please call the hospital operator.  12/14/2023, 6:28 PM

## 2023-12-14 NOTE — Evaluation (Signed)
 Clinical/Bedside Swallow Evaluation Patient Details  Name: Terrance Miller MRN: [id number] Date of Birth: 1928-01-26  Today's Date: 12/14/2023 Time: SLP Start Time (ACUTE ONLY): 1524 SLP Stop Time (ACUTE ONLY): 1554 SLP Time Calculation (min) (ACUTE ONLY): 30 min  Past Medical History:  Past Medical History:  Diagnosis Date   Acute ST elevation myocardial infarction (STEMI) of inferior wall (HCC) 2004   Coronary artery disease    DES x2 to the RCA 2004, residual disease managed medically   Hyperlipidemia    Hypertension    Type 2 diabetes mellitus (HCC)    Past Surgical History:  Past Surgical History:  Procedure Laterality Date   BIOPSY  01/28/2022   Procedure: BIOPSY;  Surgeon: Eartha Flavors, Toribio, MD;  Location: [facility];  Service: Gastroenterology;;   CATARACT EXTRACTION Bilateral    CORONARY ANGIOPLASTY WITH STENT PLACEMENT  09/05/2002   stent RCA, 80% first diagonal, 70-80% mid-diagonal, 80% mid LAD stenosis   FLEXIBLE SIGMOIDOSCOPY N/A 01/28/2022   Procedure: FLEXIBLE SIGMOIDOSCOPY;  Surgeon: Eartha Flavors Toribio, MD;  Location: [facility];  Service: Gastroenterology;  Laterality: N/A;   HYDROCELE EXCISION Bilateral 11/20/2021   Procedure: HYDROCELECTOMY ADULT;  Surgeon: Roseann Adine PARAS., MD;  Location: [facility];  Service: Urology;  Laterality: Bilateral;   IMPACTION REMOVAL  01/28/2022   Procedure: IMPACTION REMOVAL;  Surgeon: Eartha Flavors, Toribio, MD;  Location: [facility];  Service: Gastroenterology;;   NM MYOCAR PERF WALL MOTION  11/01/2008   Normal   HPI:  89 y.o. male  with past medical history of dementia, HTN, HLD, CKDIV, DM2 admitted on 12/08/2023 with septic shock due to UTI- required pressors but they are off now, AKI. Mental status is recovering poorly. BSE requested.    Assessment / Plan / Recommendation  Clinical Impression  Pt more alert this afternoon per nursing and did vocalize with SLP. BSE completed, although limited due to  inconsistent alertness and attention to task. Pt verbalized, vanilla when asked what flavor ice cream he prefers. He followed simple commands to open his mouth, say ahh ee, cough, and swallow (with PO presentation). Pt with reduced labial seal, reduced lingual movement, oral holding, suspected delay in swallow trigger, multiple swallows elicited and delayed coughing after puree (Magic cup). Pt appeared to tolerate single ice chips and small amount of water  (1/4 tsp) presentations, but required verbal and tactile cues to remain alert and attend to task. Pt is not currently appropriate for PO intake, but recommend since ice chips and small sips of water  via tsp if Pt requests and when alert and upright after oral care. Pt with suspected congestion/secretions in pharynx and could not remove despite suction and cued coughing (this was present prior to PO trials). SLP will follow per goals of care and Pt readiness for PO intake. Above to RN. Continue NPO SLP Visit Diagnosis: Dysphagia, unspecified (R13.10)    Aspiration Risk  Moderate aspiration risk;Risk for inadequate nutrition/hydration    Diet Recommendation           Other Recommendations Oral Care Recommendations: Staff/trained caregiver to provide oral care;Oral care prior to ice chip/H20     Swallow Evaluation Recommendations Recommendations: NPO;Ice chips PRN after oral care Medication Administration: Via alternative means Oral care recommendations: Oral care before ice chips/water ;Oral care QID (4x/day);Staff/trained caregiver to provide oral care;Use suctioning for oral care   Assistance Recommended at Discharge    Functional Status Assessment Patient has had a recent decline in their functional status and demonstrates the ability to  make significant improvements in function in a reasonable and predictable amount of time.  Frequency and Duration min 2x/week  1 week       Prognosis Prognosis for improved oropharyngeal function:  Fair Barriers to Reach Goals: Severity of deficits      Swallow Study   General Date of Onset: 12/14/23 HPI: 89 y.o. male  with past medical history of dementia, HTN, HLD, CKDIV, DM2 admitted on 12/08/2023 with septic shock due to UTI- required pressors but they are off now, AKI. Mental status is recovering poorly. BSE requested. Type of Study: Bedside Swallow Evaluation Previous Swallow Assessment: May 2025 D3/thin Diet Prior to this Study: NPO Temperature Spikes Noted: No Respiratory Status: Room air History of Recent Intubation: No Behavior/Cognition: Requires cueing;Alert Oral Cavity Assessment: Within Functional Limits Oral Care Completed by SLP: Yes Oral Cavity - Dentition: Missing dentition Vision: Impaired for self-feeding Self-Feeding Abilities: Total assist Patient Positioning: Upright in bed Baseline Vocal Quality: Low vocal intensity;Wet Volitional Cough: Congested;Weak Volitional Swallow: Unable to elicit    Oral/Motor/Sensory Function Overall Oral Motor/Sensory Function: Other (comment) (Pt not following commands consistently)   Ice Chips Ice chips: Impaired Presentation: Spoon Oral Phase Impairments: Reduced lingual movement/coordination Oral Phase Functional Implications: Prolonged oral transit Pharyngeal Phase Impairments: Suspected delayed Swallow;Wet Vocal Quality;Multiple swallows   Thin Liquid Thin Liquid: Impaired Presentation: Spoon Oral Phase Impairments: Reduced lingual movement/coordination Oral Phase Functional Implications: Oral holding Pharyngeal  Phase Impairments: Suspected delayed Swallow;Multiple swallows;Wet Vocal Quality;Cough - Delayed    Nectar Thick     Honey Thick     Puree Puree: Impaired Presentation: Spoon Oral Phase Impairments: Reduced lingual movement/coordination Oral Phase Functional Implications: Prolonged oral transit;Oral holding Pharyngeal Phase Impairments: Suspected delayed Swallow;Multiple swallows;Cough - Delayed    Solid     Solid: Not tested     Thank you,  [name], CCC-SLP [phone]  Frederica Chrestman 12/14/2023,4:02 PM

## 2023-12-15 LAB — GLUCOSE, CAPILLARY
Glucose-Capillary: 64 mg/dL — ABNORMAL LOW (ref 70–99)
Glucose-Capillary: 75 mg/dL (ref 70–99)
Glucose-Capillary: 76 mg/dL (ref 70–99)
Glucose-Capillary: 79 mg/dL (ref 70–99)
Glucose-Capillary: 86 mg/dL (ref 70–99)

## 2023-12-15 LAB — COMPREHENSIVE METABOLIC PANEL WITH GFR
ALT: 78 U/L — ABNORMAL HIGH (ref 0–44)
AST: 68 U/L — ABNORMAL HIGH (ref 15–41)
Albumin: 2.9 g/dL — ABNORMAL LOW (ref 3.5–5.0)
Alkaline Phosphatase: 155 U/L — ABNORMAL HIGH (ref 38–126)
Anion gap: 19 — ABNORMAL HIGH (ref 5–15)
BUN: 101 mg/dL — ABNORMAL HIGH (ref 8–23)
CO2: 27 mmol/L (ref 22–32)
Calcium: 8 mg/dL — ABNORMAL LOW (ref 8.9–10.3)
Chloride: 100 mmol/L (ref 98–111)
Creatinine, Ser: 4.56 mg/dL — ABNORMAL HIGH (ref 0.61–1.24)
GFR, Estimated: 11 mL/min — ABNORMAL LOW (ref >60)
Glucose, Bld: 74 mg/dL (ref 70–99)
Potassium: 3.3 mmol/L — ABNORMAL LOW (ref 3.5–5.1)
Sodium: 145 mmol/L (ref 135–145)
Total Bilirubin: 1.7 mg/dL — ABNORMAL HIGH (ref 0.0–1.2)
Total Protein: 5.9 g/dL — ABNORMAL LOW (ref 6.5–8.1)

## 2023-12-15 LAB — CBC
HCT: 18.2 % — ABNORMAL LOW (ref 39.0–52.0)
Hemoglobin: 6 g/dL — CL (ref 13.0–17.0)
MCH: 33.9 pg (ref 26.0–34.0)
MCHC: 33 g/dL (ref 30.0–36.0)
MCV: 102.8 fL — ABNORMAL HIGH (ref 80.0–100.0)
Platelets: 53 K/uL — ABNORMAL LOW (ref 150–400)
RBC: 1.77 MIL/uL — ABNORMAL LOW (ref 4.22–5.81)
RDW: 23.8 % — ABNORMAL HIGH (ref 11.5–15.5)
WBC: 7.4 K/uL (ref 4.0–10.5)
nRBC: 2.8 % — ABNORMAL HIGH (ref 0.0–0.2)

## 2023-12-15 LAB — BPAM RBC
Blood Product Expiration Date: 202512222359
Unit Type and Rh: 6200

## 2023-12-15 LAB — TYPE AND SCREEN
ABO/RH(D): A POS
Antibody Screen: NEGATIVE
Unit division: 0

## 2023-12-15 LAB — PREPARE RBC (CROSSMATCH)

## 2023-12-15 MED ORDER — SODIUM CHLORIDE 0.9% IV SOLUTION: Freq: Once | INTRAVENOUS | Status: AC

## 2023-12-15 MED ORDER — DEXTROSE 50 % IV SOLN
12.5000 g | INTRAVENOUS | Status: AC
  Administered 2023-12-15: 12.5 g via INTRAVENOUS
  Filled 2023-12-15: qty 50

## 2023-12-15 MED ORDER — POTASSIUM CHLORIDE 10 MEQ/100ML IV SOLN
10.0000 meq | INTRAVENOUS | Status: AC
  Administered 2023-12-15 (×2): 10 meq via INTRAVENOUS
  Filled 2023-12-15 (×2): qty 100

## 2023-12-15 NOTE — Progress Notes (Signed)
 Daily Progress Note   Patient Name: Terrance Miller       Date: 12/15/2023 DOB: 11-29-1928  Age: 89 y.o. MRN#: [id number] Attending Physician: Ricky Fines, MD Primary Care Physician: Cook, [name], DO Admit Date: 12/08/2023  Reason for Consultation/Follow-up: Establishing goals of care  Patient Profile/HPI: 89 y.o. male  with past medical history of dementia, HTN, HLD, CKDIV, DM2 admitted on 12/08/2023 with septic shock due to UTI- required pressors but they are off now, AKI. Mental status is recovering poorly. Palliative medicine consulted for GOC.   Subjective: Chart reviewed including labs, progress notes, imaging from this and previous encounters.  Patient with hemoglobin 6.0 today- received transfusion.  Discussed with RN- patient is hypothermic.  On eval Mr. Amirault opens eyes but does not speak to me verbally.  I called [name] for followup.  [name] was able to speak with MD yesterday.  [name] stated she would like to take Palliative care off the table for now. I reviewed role of Palliative medicine in advocacy and support, differentiating role of Palliative in the inpatient setting vs hospice in the outpatient setting. Assured that Palliative followup does not mean comfort care or hospice.  [name] prefers to have Palliative sign off.  I notified attending MD of patient's daughters wishes.   Review of Systems  Unable to perform ROS: Mental status change     Physical Exam Vitals and nursing note reviewed.  Constitutional:      Appearance: He is ill-appearing.  Cardiovascular:     Rate and Rhythm: Normal rate.  Pulmonary:     Effort: Pulmonary effort is normal.             Vital Signs: BP 99/64   Pulse (!) 58   Temp (!) 96.8 F (36 C) (Rectal)   Resp 20   Ht 5' 8  (1.727 m)   Wt 89.2 kg   SpO2 96%   BMI 29.90 kg/m  SpO2: SpO2: 96 % O2 Device: O2 Device: Room Air O2 Flow Rate: O2 Flow Rate (L/min): 4 L/min  Intake/output summary:  Intake/Output Summary (Last 24 hours) at 12/15/2023 1524 Last data filed at 12/15/2023 1243 Gross per 24 hour  Intake 2791.7 ml  Output 800 ml  Net 1991.7 ml   LBM: Last BM Date : 12/14/23 Baseline Weight: Weight: 79.7 kg Most  recent weight: Weight: 89.2 kg       Palliative Assessment/Data: PPS: 10%      Patient Active Problem List   Diagnosis Date Noted   Septic shock (HCC) 12/08/2023   C. difficile colitis 06/05/2023   Bradycardia 06/04/2023   Hypotension 06/04/2023   Lactic acidosis 06/04/2023   Type 2 diabetes mellitus with hyperglycemia (HCC) 06/04/2023   New onset atrial fibrillation (HCC) 06/03/2023   Dementia with behavioral disturbance (HCC) 05/29/2023   Acute on chronic heart failure with preserved ejection fraction (HFpEF) (HCC) 05/10/2023   Stercoral colitis 05/04/2023   (HFpEF) heart failure with preserved ejection fraction (HCC) 05/04/2023   Nausea with vomiting 05/04/2023   DNR (do not resuscitate) 05/04/2023   FTT (failure to thrive) in adult 05/04/2023   UTI (urinary tract infection) 04/21/2023   Thrombocytopenia 04/21/2023   Acute on chronic diastolic CHF (congestive heart failure) (HCC) 04/21/2023   Obesity, Class III, BMI 40-49.9 (morbid obesity) (HCC) 04/21/2023   Injury to scrotum, penis, or foreskin 03/18/2023   Type 2 diabetes mellitus with chronic kidney disease, with long-term current use of insulin  (HCC) 12/20/2022   Glaucoma 12/20/2022   Vascular dementia without behavioral disturbance (HCC) 08/15/2022   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   Aortic atherosclerosis 08/05/2022   Chronic constipation 08/05/2022   Increased intraocular pressure, bilateral 08/05/2022   Edema, peripheral 08/05/2022   Aortic regurgitation 03/17/2022   Pulmonary HTN  (HCC) 03/17/2022   Dry skin dermatitis 02/05/2022   Stage 3b chronic kidney disease (HCC) 11/05/2021   MGUS (monoclonal gammopathy of unknown significance) 11/05/2021   Acute metabolic encephalopathy 07/27/2021   Macrocytic anemia 05/31/2021   History of MI (myocardial infarction) 04/29/2021   Hypothermia 05/20/2017   Severe sepsis with septic shock (HCC) 05/20/2017   Essential hypertension 09/28/2013   Type 2 diabetes mellitus with hyperlipidemia (HCC) 09/28/2013   CAD (coronary artery disease) 09/28/2013    Palliative Care Assessment & Plan    Assessment/Recommendations/Plan  Septic shock with acute kidney injury, severe anemia of unknown origin, encephalopathy- daughter expressed desire for continued aggresive medical interventions and requested Palliative to sign off Palliative will sign off- please feel free to re-consult in the future if family is in agreement with Palliative involvement   Code Status:   Code Status: Limited: Do not attempt resuscitation (DNR) -DNR-LIMITED -Do Not Intubate/DNI    Prognosis:  Unable to determine  Discharge Planning: To Be Determined  Care plan was discussed with bedside RN, attending MD, and daughter.   Thank you for allowing the Palliative Medicine Team to assist in the care of this patient.  Total time:  Prolonged billing:  Time includes:   Preparing to see the patient (e.g., review of tests) Obtaining and/or reviewing separately obtained history Performing a medically necessary appropriate examination and/or evaluation Counseling and educating the patient/family/caregiver Ordering medications, tests, or procedures Referring and communicating with other health care professionals (when not reported separately) Documenting clinical information in the electronic or other health record Independently interpreting results (not reported separately) and communicating results to the patient/family/caregiver Care coordination (not  reported separately) Clinical documentation  Cassondra Stain, AGNP-C Palliative Medicine   Please contact Palliative Medicine Team phone at [phone] for questions and concerns.

## 2023-12-15 NOTE — Plan of Care (Signed)
  Problem: Education: Goal: Knowledge of General Education information will improve Description: Including pain rating scale, medication(s)/side effects and non-pharmacologic comfort measures Outcome: Progressing   Problem: Health Behavior/Discharge Planning: Goal: Ability to manage health-related needs will improve Outcome: Progressing   Problem: Clinical Measurements: Goal: Ability to maintain clinical measurements within normal limits will improve Outcome: Progressing Goal: Will remain free from infection Outcome: Progressing Goal: Diagnostic test results will improve Outcome: Progressing Goal: Respiratory complications will improve Outcome: Progressing Goal: Cardiovascular complication will be avoided Outcome: Progressing   Problem: Activity: Goal: Risk for activity intolerance will decrease Outcome: Progressing   Problem: Nutrition: Goal: Adequate nutrition will be maintained Outcome: Progressing   Problem: Coping: Goal: Level of anxiety will decrease Outcome: Progressing   Problem: Elimination: Goal: Will not experience complications related to bowel motility Outcome: Progressing Goal: Will not experience complications related to urinary retention Outcome: Progressing   Problem: Pain Managment: Goal: General experience of comfort will improve and/or be controlled Outcome: Progressing   Problem: Safety: Goal: Ability to remain free from injury will improve Outcome: Progressing   Problem: Skin Integrity: Goal: Risk for impaired skin integrity will decrease Outcome: Progressing   Problem: Fluid Volume: Goal: Hemodynamic stability will improve Outcome: Progressing   Problem: Clinical Measurements: Goal: Diagnostic test results will improve Outcome: Progressing Goal: Signs and symptoms of infection will decrease Outcome: Progressing   Problem: Respiratory: Goal: Ability to maintain adequate ventilation will improve Outcome: Progressing   Problem:  Education: Goal: Ability to demonstrate management of disease process will improve Outcome: Progressing Goal: Ability to verbalize understanding of medication therapies will improve Outcome: Progressing Goal: Individualized Educational Video(s) Outcome: Progressing   Problem: Activity: Goal: Capacity to carry out activities will improve Outcome: Progressing   Problem: Cardiac: Goal: Ability to achieve and maintain adequate cardiopulmonary perfusion will improve Outcome: Progressing

## 2023-12-15 NOTE — Progress Notes (Signed)
 PROGRESS NOTE    Terrance Miller  FMW:[id number] DOB: December 16, 1928 DOA: 12/08/2023 PCP: Cook, [name], DO   Chief Complaint  Patient presents with   Hypotension    Brief Hospital admission narrative:  As per H&P written by Dr. Manfred on 12/08/2023  Terrance Miller is a 89 y.o. male with medical history significant of hypertension, hyperlipidemia, MGUS, CKD stage III, diet-controlled diabetes mellitus type 2, thrombocytopenia, dementia, HFpEF, coronary disease who presents to the emergency department via EMS due to altered mental status.  She was noted with altered mental status for the SNF yesterday in the evening with heart rate in 30s and patient was hypotensive with SBP in the 70s.   Patient was admitted from 5/28 to 6/2 and presented with similar presentation where he had hypothermia and hypotension as well as C. difficile colitis.   ED course In the emergency department, he was hypothermic with temperature 86.35F, respiratory rate 17, pulse 27 bpm, BP 67/40, O2 sats was 88% on 6 LPM.  Workup in the ED showed macrocytic anemia with MCV of 103.8, hemoglobin 7.0, platelets 14.  BMP shows sodium of 148, potassium 4.8, chloride 117, bicarb 14 blood glucose 118, BUN 77, creatinine 4.18 calcium  8.4, albumin 3.4, AST 128, ALT 149, ALP 151, eGFR 13, anion gap 18 Chest x-ray showed stable cardiomegaly with low lung volumes with mild bibasilar atelectasis Patient was started on IV vancomycin  and cefepime .  IV NS 2 L was provided in the ED Discussion was held with daughter at bedside who wanted to try peripheral IV pressors, but does not want any more invasive procedures and was willing to make patient comfort care if patient does not respond to pressors and antibiotics.  She objected to catheterizing patient to obtain urinalysis.    Assessment & Plan: 1-septic shock - With concerns for UTI - Patient continued to demonstrate the need for peripheral pressors, continue to experience hypothermia, ongoing  hypoglycemia and inability to follow any commands or being able to maintain nutrition on his own. - Extensive discussion with family members at bedside complain overall poor prognosis. - For now continue current therapy without escalating management. - They have confirmed DNR/DNI. - Continue D5/half-normal saline and bicarbonate drip. - Levophed  has been discontinued; vancomycin  also has been stopped. - Continue IV cefepime  at this time.  2-lactic acidosis/high anion gap metabolic acidosis - Secondary to problem #1 - Up to 3.3 at time of admission - After fluid resuscitation down to 2.1. - Gap up to 18 at time of admission - Planning to repeat blood work in a.m. to further assess patient's stability and determine if there is any further stabilization that can be provided.  3-acute metabolic encephalopathy in the setting of septic shock - Some improvement appreciated in patient's mentation. - Continue supportive care.  4-thrombocytopenia platelets down to 14 - No overt bleeding - Family has expressed no wanting platelets or blood transfusion - Most likely associated with sepsis. - Continue avoiding heparin  products and use SCDs for DVT prophylaxis. - After discussing with family we will check CBC in AM.  5-acute kidney injury on chronic kidney disease - Stage V at baseline  - Creatinine up to 4.8 - Per nursing report almost meeting criteria for anuria - Continue follow response. - Patient's BUN in the 88 range; not a candidate for dialysis.  6-chronic diastolic heart failure - Clinically dehydrated at the moment - In the setting of ongoing renal dysfunction and high risk for fluid overload.  7-mixed hyperlipidemia - Unable  to take oral medications and currently with elevated transaminitis - Continue to hold statin.  8-transaminitis - In the setting of septic shock/shock of liver - Continue fluid resuscitation and follow LFTs intermittently.  9-acute on chronic anemia  -  No overt bleeding appreciated - Some component of hemodilution most likely playing a role - Hgb at transfusion threshold, will give 1 unit of PRBC.  - Continue to follow trend.  10-ethics/social - Goals of care and poor prognosis discussed with family members at bedside - For now continue current therapy without further escalation and maintain DNI/DNR status. - Anticipating transition to full comfort care in the next 24 hours with most likely hospital death. - I have discussed with patient's POA ([name]).  She understood inability to continue with peripheral pressors support.   - After discussing with family today they would like to reassess blood work in the morning and try to replete/transfuse if needed to further assist stabilizing patient. - They would like to continue IV cefepime  for now (day 7/10). - Speech therapy has been requested; patient remain at high risk for aspiration and was not able to properly participate on today's assessment by speech therapy. - Follow further discussion with palliative care and with family..  DVT prophylaxis: SCDs Code Status: DNR/DNI Family Communication: Discussed with patient's daughters and son at bedside. Disposition:   Status is: Inpatient Remains inpatient appropriate because: Continue IV therapy.   Consultants:  Palliative care  Procedures: See below for x-ray reports.  Antimicrobials:  Vancomycin  has been discontinued Cefepime   Subjective: Overall poor condition with guarded prognosis; stable currently for his state.  No fever, no nausea, no vomiting.  Continues to experience intermittent episode of hypothermia and is still requiring IV dextrose  infusion as he is not capable of eating currently due to high risk for aspiration with positive hypoglycemia without supplementation.  Objective: Vitals:   12/15/23 0844 12/15/23 0846 12/15/23 0855 12/15/23 0900  BP:   (!) 95/48 (!) 82/40  Pulse: (!) 47 (!) 43 (!) 48   Resp: 13 13 13  15   Temp: (!) 95.4 F (35.2 C) (!) 95.4 F (35.2 C) (!) 95.5 F (35.3 C)   TempSrc: Rectal Rectal Rectal   SpO2: 98% 99% 98% 99%  Weight:      Height:        Intake/Output Summary (Last 24 hours) at 12/15/2023 1050 Last data filed at 12/15/2023 0944 Gross per 24 hour  Intake 2326.57 ml  Output 800 ml  Net 1526.57 ml   Filed Weights   12/08/23 0004 12/08/23 0550 12/13/23 0400  Weight: 79.7 kg 82 kg 89.2 kg    Examination: General exam: Afebrile and in no acute distress.  Continues to demonstrate difficulty clearing his airways (gurgly sound on exam). Respiratory system: Positive rhonchi right, no using accessory muscles.  Good saturation on room air. Cardiovascular system: Rate controlled, no rubs, no gallops, no JVD. Gastrointestinal system: Abdomen is nondistended, soft and nontender. No organomegaly or masses felt. Normal bowel sounds heard. Central nervous system: Limited evaluation secondary to overall current mentation.  Patient wiggling toes on commands and able to move limbs some to pain stimuli. Extremities: No cyanosis or clubbing; 1+ edema appreciated bilaterally. Skin: No petechiae; positive lower extremity chronic stasis dermatitis on exam. Psychiatry: Judgement and insight appear impaired in the setting of current mentation.  Data Reviewed: I have personally reviewed following labs and imaging studies  CBC: Recent Labs  Lab 12/09/23 0333 12/11/23 0440 12/12/23 0426 12/15/23 0428  WBC 10.0 5.1 4.6 7.4  HGB 6.6* 6.2* 5.9* 6.0*  HCT 20.0* 18.6* 17.5* 18.2*  MCV 102.6* 101.1* 98.3 102.8*  PLT 18* 18* 25* 53*    Basic Metabolic Panel: Recent Labs  Lab 12/09/23 0333 12/11/23 0440 12/12/23 0426 12/15/23 0428  NA 144 142 142 145  K 4.6 4.0 3.6 3.3*  CL 110 106 103 100  CO2 18* 20* 23 27  GLUCOSE 89 87 100* 74  BUN 80* 86* 88* 101*  CREATININE 4.63* 4.70* 4.79* 4.56*  CALCIUM  7.7* 7.5* 7.6* 8.0*    GFR: Estimated Creatinine Clearance: 11 mL/min (A)  (by C-G formula based on SCr of 4.56 mg/dL (H)).  Liver Function Tests: Recent Labs  Lab 12/09/23 0333 12/15/23 0428  AST 87* 68*  ALT 133* 78*  ALKPHOS 146* 155*  BILITOT 0.7 1.7*  PROT 6.4* 5.9*  ALBUMIN 3.4* 2.9*    CBG: Recent Labs  Lab 12/14/23 1604 12/14/23 1931 12/14/23 1958 12/14/23 2351 12/15/23 0430  GLUCAP 65* 64* 101* 81 76     Recent Results (from the past 240 hours)  MRSA Next Gen by PCR, Nasal     Status: Abnormal   Collection Time: 12/08/23  6:00 AM   Specimen: Nasal Mucosa; Nasal Swab  Result Value Ref Range Status   MRSA by PCR Next Gen DETECTED (A) NOT DETECTED Final    Comment: CRITICAL RESULT CALLED TO, READ BACK BY AND VERIFIED WITH: O PUCKETT AT 0855 ON 12.02.25 BY ADGER J         The GeneXpert MRSA Assay (FDA approved for NASAL specimens only), is one component of a comprehensive MRSA colonization surveillance program. It is not intended to diagnose MRSA infection nor to guide or monitor treatment for MRSA infections. Performed at [facility], [address], [place], [place] [address]   Urine Culture     Status: Abnormal   Collection Time: 12/08/23  2:32 PM   Specimen: Urine, Random  Result Value Ref Range Status   Specimen Description   Final    URINE, RANDOM Performed at [facility], [address] [place], [place] [address]    Special Requests   Final    NONE Reflexed from [id number] Performed at [facility], [address], [place], [place] [address]    Culture (A)  Final    <10,000 COLONIES/mL INSIGNIFICANT GROWTH Performed at [facility], [address], [place], [place] [address]    Report Status 12/09/2023 FINAL  Final    Radiology Studies: No results found.  Scheduled Meds:  Chlorhexidine  Gluconate Cloth  6 each Topical Daily   mupirocin  ointment   Nasal BID   mouth rinse  15 mL Mouth Rinse 4 times per day   scopolamine   1 patch Transdermal Q72H   Continuous Infusions:  ceFEPime  (MAXIPIME ) IV Stopped  (12/15/23 0649)   sodium bicarbonate  150 mEq in sterile water  1,150 mL infusion 50 mL/hr at 12/15/23 0944     LOS: 7 days   Time 50 minutes   Eric Nunnery, MD [facility]  To contact the attending provider between 7A-7P or the covering provider during after hours 7P-7A, please log into the web site [email or web address] and access using universal [place] password for that web site. If you do not have the password, please call the hospital operator.  12/15/2023, 10:50 AM

## 2023-12-15 NOTE — Progress Notes (Signed)
 OT Cancellation Note  Patient Details Name: Anthon Harpole MRN: [id number] DOB: [birth year]   Cancelled Treatment:    Reason Eval/Treat Not Completed: Patient at procedure or test/ unavailable. Pt currently receiving blood. Will attempt evaluation later as time permits.   Cache Decoursey OT, MOT   Jayson Person 12/15/2023, 10:28 AM

## 2023-12-15 NOTE — Progress Notes (Signed)
   12/15/23 1342  Spiritual Encounters  Type of Visit Attempt (pt unavailable)  Care provided to: Patient  Conversation partners present during encounter Nurse  Referral source Nurse (RN/NT/LPN)  Reason for visit End-of-life  OnCall Visit No   Reason for Visit: Chaplain responding to Spiritual Consult stating that Pt is at end of life.  Description of Visit: Upon entering the room I found Pt in the bed with no support persons present.  Pt was asleep and difficult to rouse.  When Pt did respond it was a few unintelligible sounds and then he was out again.  Prior to leaving the floor I spoke with the Rn and asked if this was baseline for him.  She suggested that today he is more somnolent, but was clearer yesterday.  She will page me if his present condition clears up, or is family arrives.  Plan of Care: I will plan to follow up with this Pt on a daily basis  Maude Roll, MDiv Chaplain, [email or web address] [phone] 12/15/2023 1:52 PM

## 2023-12-15 NOTE — Progress Notes (Signed)
 SLP Cancellation Note  Patient Details Name: Terrance Miller MRN: [id number] DOB: February 14, 1928   Cancelled treatment:       Reason Eval/Treat Not Completed: Fatigue/lethargy limiting ability to participate   Pat Lacosta Hargan,M.S.,CCC-SLP 12/15/2023, 1:02 PM

## 2023-12-16 LAB — CBC
HCT: 20 % — ABNORMAL LOW (ref 39.0–52.0)
Hemoglobin: 6.8 g/dL — CL (ref 13.0–17.0)
MCH: 33.7 pg (ref 26.0–34.0)
MCHC: 34 g/dL (ref 30.0–36.0)
MCV: 99 fL (ref 80.0–100.0)
Platelets: 86 K/uL — ABNORMAL LOW (ref 150–400)
RBC: 2.02 MIL/uL — ABNORMAL LOW (ref 4.22–5.81)
RDW: 21.6 % — ABNORMAL HIGH (ref 11.5–15.5)
WBC: 7.7 K/uL (ref 4.0–10.5)
nRBC: 3.5 % — ABNORMAL HIGH (ref 0.0–0.2)

## 2023-12-16 LAB — GLUCOSE, CAPILLARY
Glucose-Capillary: 102 mg/dL — ABNORMAL HIGH (ref 70–99)
Glucose-Capillary: 102 mg/dL — ABNORMAL HIGH (ref 70–99)
Glucose-Capillary: 113 mg/dL — ABNORMAL HIGH (ref 70–99)
Glucose-Capillary: 109 mg/dL — ABNORMAL HIGH (ref 70–99)

## 2023-12-16 MED ORDER — DEXTROSE-SODIUM CHLORIDE 5-0.45 % IV SOLN: INTRAVENOUS | Status: AC

## 2023-12-16 NOTE — Evaluation (Signed)
 Physical Therapy Evaluation Patient Details Name: Terrance Miller MRN: [id number] DOB: 12/24/1928 Today's Date: 12/16/2023  History of Present Illness  Terrance Miller is a 89 y.o. male with medical history significant of hypertension, hyperlipidemia, MGUS, CKD stage III, diet-controlled diabetes mellitus type 2, thrombocytopenia, dementia, HFpEF, coronary disease who presents to the emergency department via EMS due to altered mental status.  She was noted with altered mental status for the SNF yesterday in the evening with heart rate in 30s and patient was hypotensive with SBP in the 70s.   Clinical Impression  Patient presents lethargic and requires max verbal/tactile cueing for participating with therapy. Patient demonstrates slow labored movement for sitting up at bedside, limited to lifting bottom of bed, but unable to come to complete standing due to weakness and easily fatigue. Patient put back to bed with Max/total assist for repositioning. Patient will benefit from continued skilled physical therapy in hospital and recommended venue below to increase strength, balance, endurance for safe ADLs and gait.          If plan is discharge home, recommend the following: A lot of help with bathing/dressing/bathroom;A lot of help with walking and/or transfers;Help with stairs or ramp for entrance;Assist for transportation;Assistance with cooking/housework   Can travel by private vehicle   No    Equipment Recommendations None recommended by PT  Recommendations for Other Services       Functional Status Assessment Patient has had a recent decline in their functional status and demonstrates the ability to make significant improvements in function in a reasonable and predictable amount of time.     Precautions / Restrictions Precautions Precautions: Fall Recall of Precautions/Restrictions: Impaired Restrictions Weight Bearing Restrictions Per Provider Order: No      Mobility  Bed  Mobility Overal bed mobility: Needs Assistance Bed Mobility: Supine to Sit, Sit to Supine     Supine to sit: Max assist, Total assist, HOB elevated Sit to supine: Max assist, Total assist   General bed mobility comments: slow labored movement with limited use of extremtities due to weakness    Transfers Overall transfer level: Needs assistance Equipment used: Rolling walker (2 wheels) Transfers: Sit to/from Stand Sit to Stand: Max assist, Total assist           General transfer comment: limited to partial stand due to BLE weakness, poor standing balance    Ambulation/Gait                  Stairs            Wheelchair Mobility     Tilt Bed    Modified Rankin (Stroke Patients Only)       Balance Overall balance assessment: Needs assistance Sitting-balance support: Feet supported, No upper extremity supported Sitting balance-Leahy Scale: Poor Sitting balance - Comments: fair/poor seated at EOB   Standing balance support: During functional activity, Bilateral upper extremity supported Standing balance-Leahy Scale: Zero Standing balance comment: unable to stand using RW                             Pertinent Vitals/Pain Pain Assessment Pain Assessment: Faces Faces Pain Scale: Hurts a little bit Pain Location: General pain with movement. Pain Descriptors / Indicators: Moaning Pain Intervention(s): Limited activity within patient's tolerance, Monitored during session, Repositioned    Home Living Family/patient expects to be discharged to:: Skilled nursing facility  Prior Function Prior Level of Function : Needs assist;Patient poor historian/Family not available  Cognitive Assist : Mobility (cognitive)     Physical Assist : Mobility (physical);ADLs (physical) Mobility (physical): Transfers;Gait;Bed mobility;Stairs ADLs (physical): IADLs;Dressing;Bathing Mobility Comments: household ambulator using RW.  pt reports assist from daughter and son in law that pt given assist for sit/stand transfers and ambulates independently with RW (from previous admission) ADLs Comments: Assisted with bathing and dressing per pt's reports. Chart review indicates assist for IADL's as well.     Extremity/Trunk Assessment   Upper Extremity Assessment Upper Extremity Assessment: Defer to OT evaluation    Lower Extremity Assessment Lower Extremity Assessment: Generalized weakness    Cervical / Trunk Assessment Cervical / Trunk Assessment: Kyphotic  Communication   Communication Communication: Impaired Factors Affecting Communication: Difficulty expressing self    Cognition Arousal: Lethargic Behavior During Therapy: Flat affect   PT - Cognitive impairments: History of cognitive impairments                         Following commands: Impaired Following commands impaired: Follows one step commands inconsistently     Cueing Cueing Techniques: Verbal cues, Tactile cues, Visual cues     General Comments      Exercises     Assessment/Plan    PT Assessment Patient needs continued PT services  PT Problem List Decreased strength;Decreased activity tolerance;Decreased balance;Decreased mobility       PT Treatment Interventions DME instruction;Gait training;Stair training;Functional mobility training;Therapeutic activities;Therapeutic exercise;Balance training;Patient/family education    PT Goals (Current goals can be found in the Care Plan section)  Acute Rehab PT Goals Patient Stated Goal: not stated PT Goal Formulation: With patient Time For Goal Achievement: 12/30/23 Potential to Achieve Goals: Fair    Frequency Min 2X/week     Co-evaluation PT/OT/SLP Co-Evaluation/Treatment: Yes Reason for Co-Treatment: Complexity of the patient's impairments (multi-system involvement) PT goals addressed during session: Mobility/safety with mobility;Balance OT goals addressed during  session: ADL's and self-care       AM-PAC PT 6 Clicks Mobility  Outcome Measure Help needed turning from your back to your side while in a flat bed without using bedrails?: A Lot Help needed moving from lying on your back to sitting on the side of a flat bed without using bedrails?: A Lot Help needed moving to and from a bed to a chair (including a wheelchair)?: Total Help needed standing up from a chair using your arms (e.g., wheelchair or bedside chair)?: Total Help needed to walk in hospital room?: Total Help needed climbing 3-5 steps with a railing? : Total 6 Click Score: 8    End of Session   Activity Tolerance: Patient tolerated treatment well;Patient limited by fatigue;Patient limited by lethargy Patient left: in bed;with call bell/phone within reach Nurse Communication: Mobility status PT Visit Diagnosis: Unsteadiness on feet (R26.81);Other abnormalities of gait and mobility (R26.89);Muscle weakness (generalized) (M62.81)    Time: 9080-9064 PT Time Calculation (min) (ACUTE ONLY): 16 min   Charges:   PT Evaluation $PT Eval Low Complexity: 1 Low PT Treatments $Therapeutic Activity: 8-22 mins PT General Charges $$ ACUTE PT VISIT: 1 Visit         12:34 PM, 12/16/23 Lynwood Music, MPT Physical Therapist with [facility] [phone] office [phone] mobile phone

## 2023-12-16 NOTE — Progress Notes (Signed)
 Speech Language Pathology Treatment: Dysphagia  Patient Details Name: Terrance Miller MRN: [id number] DOB: 15-Feb-1928 Today's Date: 12/16/2023 Time: 8750-8693 SLP Time Calculation (min) (ACUTE ONLY): 17 min  Assessment / Plan / Recommendation Clinical Impression  Recommend continue NPO with ice chips for comfort following oral care; ST will continue to f/u in acute setting for po readiness/objective assessment if pt able.  Pt seen for dysphagia tx/re-assessment of swallow function with pt alertness level improved, but demonstrating oral retention/holding and max verbal/tactile cues required for pt to complete 1-step commands which were inconsistent (30% accuracy) during oral commands.  Pt consumed limited, guided amounts of ice chips (single), 1/3 tsp thin liquid and 1/3 tsp puree with oral suctioning provided post swallow d/t delayed swallow initiation and oral retention d/t cognitive impairment.  Pt exhibited a slightly wet vocal quality with delayed throat clearing and attempted, weak cough response.  Pt did instill repetitive swallows with max cues, but this was not efficient in improving overall vocal quality, so po attempts ceased.     HPI HPI: 89 y.o. male with past medical history of dementia, HTN, HLD, CKDIV, DM2 admitted on 12/08/2023 with septic shock due to UTI- required pressors but they are off now, AKI. Mental status is recovering poorly. BSE requested and recommended NPO.  ST f/u for po readiness.      SLP Plan  Continue with current plan of care        Swallow Evaluation Recommendations   Recommendations: NPO;Ice chips PRN after oral care Medication Administration: Via alternative means Supervision: Full assist for feeding Oral care recommendations: Oral care before ice chips/water      Recommendations   NPO; ice chips allowed after oral care completion                  Oral care QID;Staff/trained caregiver to provide oral care     Dysphagia, unspecified  (R13.10)     Continue with current plan of care     Pat Ayeden Gladman,M.S.,CCC-SLP  12/16/2023, 1:15 PM

## 2023-12-16 NOTE — Progress Notes (Signed)
 D5 half-normal saline at 40 cc/h drip to prevent hypoglycemia.  Isotonic bicarb held due to hypokalemia and resolved metabolic acidosis with serum bicarb 27.  Will continue to closely monitor and treat as indicated.  No charge note.

## 2023-12-16 NOTE — Plan of Care (Signed)
°  Problem: Education: Goal: Knowledge of General Education information will improve Description: Including pain rating scale, medication(s)/side effects and non-pharmacologic comfort measures Outcome: Progressing   Problem: Health Behavior/Discharge Planning: Goal: Ability to manage health-related needs will improve Outcome: Progressing   Problem: Clinical Measurements: Goal: Ability to maintain clinical measurements within normal limits will improve Outcome: Progressing Goal: Will remain free from infection Outcome: Progressing Goal: Diagnostic test results will improve Outcome: Progressing Goal: Respiratory complications will improve Outcome: Progressing Goal: Cardiovascular complication will be avoided Outcome: Progressing   Problem: Activity: Goal: Risk for activity intolerance will decrease Outcome: Progressing   Problem: Nutrition: Goal: Adequate nutrition will be maintained Outcome: Progressing   Problem: Coping: Goal: Level of anxiety will decrease Outcome: Progressing   Problem: Elimination: Goal: Will not experience complications related to bowel motility Outcome: Progressing Goal: Will not experience complications related to urinary retention Outcome: Progressing   Problem: Pain Managment: Goal: General experience of comfort will improve and/or be controlled Outcome: Progressing   Problem: Safety: Goal: Ability to remain free from injury will improve Outcome: Progressing   Problem: Skin Integrity: Goal: Risk for impaired skin integrity will decrease Outcome: Progressing   Problem: Fluid Volume: Goal: Hemodynamic stability will improve Outcome: Progressing   Problem: Clinical Measurements: Goal: Diagnostic test results will improve Outcome: Progressing Goal: Signs and symptoms of infection will decrease Outcome: Progressing   Problem: Respiratory: Goal: Ability to maintain adequate ventilation will improve Outcome: Progressing   Problem:  Education: Goal: Ability to demonstrate management of disease process will improve Outcome: Progressing Goal: Ability to verbalize understanding of medication therapies will improve Outcome: Progressing Goal: Individualized Educational Video(s) Outcome: Progressing   Problem: Activity: Goal: Capacity to carry out activities will improve Outcome: Progressing   Problem: Cardiac: Goal: Ability to achieve and maintain adequate cardiopulmonary perfusion will improve Outcome: Progressing

## 2023-12-16 NOTE — Evaluation (Signed)
 Occupational Therapy Evaluation Patient Details Name: Terrance Miller MRN: [id number] DOB: Feb 06, 1928 Today's Date: 12/16/2023   History of Present Illness   Terrance Miller is a 89 y.o. male with medical history significant of hypertension, hyperlipidemia, MGUS, CKD stage III, diet-controlled diabetes mellitus type 2, thrombocytopenia, dementia, HFpEF, coronary disease who presents to the emergency department via EMS due to altered mental status.  She was noted with altered mental status for the SNF yesterday in the evening with heart rate in 30s and patient was hypotensive with SBP in the 70s. (per DO)     Clinical Impressions Pt lethargic and tolerant of OT and PT co-evaluation. No verbal communication from the pt today, only moaning during mobility. Max to total assist needed for bed mobility, but once upright pt could sit at EOB with single UE support periodically without additional physical assist. Total assist for attempted sit to stand with pt's bottom slightly raising from the bed. Difficult to assess B UE but pt appears to be very weak with assist needed to grasp RW. Pt does not follow commands consistently. Max to total assist for all ADL's at this time per clinical judgement. Pt left in the bed with call bell within reach. Pt will benefit from continued OT in the hospital to increase strength, balance, and endurance for safe ADL's.        If plan is discharge home, recommend the following:   Two people to help with walking and/or transfers;Two people to help with bathing/dressing/bathroom;A lot of help with bathing/dressing/bathroom;Assistance with cooking/housework;Assistance with feeding;Direct supervision/assist for medications management;Direct supervision/assist for financial management;Assist for transportation;Help with stairs or ramp for entrance     Functional Status Assessment   Patient has had a recent decline in their functional status and demonstrates the ability to  make significant improvements in function in a reasonable and predictable amount of time.     Equipment Recommendations   None recommended by OT             Precautions/Restrictions   Precautions Precautions: Fall Recall of Precautions/Restrictions: Impaired Restrictions Weight Bearing Restrictions Per Provider Order: No     Mobility Bed Mobility Overal bed mobility: Needs Assistance Bed Mobility: Supine to Sit, Sit to Supine     Supine to sit: Max assist, Total assist, HOB elevated Sit to supine: Max assist, Total assist   General bed mobility comments: Much assist for trunk control and to move B UE in and out of bed.    Transfers                   General transfer comment: Partial stand at EOB with total assist by physical therapist and this OT as well as RW. Pt's bottom lifted off the bed slightly.      Balance Overall balance assessment: Needs assistance Sitting-balance support: Single extremity supported, Feet supported Sitting balance-Leahy Scale: Poor Sitting balance - Comments: Poor, but able to sit unsupported periodically at the EOB.                                   ADL either performed or assessed with clinical judgement   ADL Overall ADL's : Needs assistance/impaired Eating/Feeding: Total assistance;Maximal assistance;Bed level   Grooming: Total assistance;Bed level   Upper Body Bathing: Maximal assistance;Total assistance;Bed level   Lower Body Bathing: Total assistance;Bed level   Upper Body Dressing : Maximal assistance;Total assistance;Bed level   Lower Body Dressing:  Total assistance;Bed level   Toilet Transfer: Total assistance   Toileting- Clothing Manipulation and Hygiene: Total assistance;Bed level               Vision   Vision Assessment?: No apparent visual deficits Additional Comments: Unable to formally assess. Pt eyes often closed.     Perception Perception: Not tested       Praxis  Praxis: Not tested       Pertinent Vitals/Pain Pain Assessment Pain Assessment: Faces Faces Pain Scale: Hurts a little bit Pain Location: General pain with movement. Pain Descriptors / Indicators: Moaning Pain Intervention(s): Repositioned, Monitored during session, Limited activity within patient's tolerance     Extremity/Trunk Assessment Upper Extremity Assessment Upper Extremity Assessment: Generalized weakness;Difficult to assess due to impaired cognition (Assist needed to grasp RW; able to bear some weight on R UE when seated at EOB.)   Lower Extremity Assessment Lower Extremity Assessment: Defer to PT evaluation       Communication Communication Communication: Impaired Factors Affecting Communication: Difficulty expressing self   Cognition Arousal: Lethargic Behavior During Therapy: Flat affect Cognition: Cognition impaired, No family/caregiver present to determine baseline             OT - Cognition Comments: Pt mostly moanging. No verbal communication.                 Following commands: Impaired Following commands impaired: Follows one step commands inconsistently     Cueing  General Comments   Cueing Techniques: Verbal cues;Tactile cues;Visual cues                 Home Living Family/patient expects to be discharged to:: Skilled nursing facility                                        Prior Functioning/Environment Prior Level of Function : Needs assist;Patient poor historian/Family not available       Physical Assist : Mobility (physical);ADLs (physical) Mobility (physical): Transfers;Gait;Bed mobility;Stairs ADLs (physical): IADLs;Dressing;Bathing Mobility Comments: household ambulator using RW. pt reports assist from daughter and son in law that pt given assist for sit/stand transfers and ambulates independently with RW. (per chart; pt unable to report today) ADLs Comments: Assisted with bathing and dressing per pt's  reports. Chart review indicates assist for IADL's as well. (Per chart; pt unable to report today.)    OT Problem List: Decreased strength;Decreased activity tolerance;Decreased range of motion;Impaired balance (sitting and/or standing);Impaired vision/perception;Decreased coordination;Decreased cognition;Decreased safety awareness;Decreased knowledge of use of DME or AE;Impaired UE functional use   OT Treatment/Interventions: Self-care/ADL training;Therapeutic exercise;Energy conservation;DME and/or AE instruction;Therapeutic activities;Cognitive remediation/compensation;Patient/family education;Balance training      OT Goals(Current goals can be found in the care plan section)   Acute Rehab OT Goals Patient Stated Goal: none stated OT Goal Formulation: Patient unable to participate in goal setting Time For Goal Achievement: 12/30/23 Potential to Achieve Goals: Fair   OT Frequency:  Min 2X/week    Co-evaluation PT/OT/SLP Co-Evaluation/Treatment: Yes Reason for Co-Treatment: Complexity of the patient's impairments (multi-system involvement)   OT goals addressed during session: ADL's and self-care                       End of Session Equipment Utilized During Treatment: Rolling walker (2 wheels) Nurse Communication: Mobility status  Activity Tolerance: Patient limited by lethargy Patient left: in bed;with call bell/phone within reach  OT Visit Diagnosis: Unsteadiness on feet (R26.81);Other abnormalities of gait and mobility (R26.89);Muscle weakness (generalized) (M62.81);Cognitive communication deficit (R41.841)                Time: 9081-9066 OT Time Calculation (min): 15 min Charges:  OT General Charges $OT Visit: 1 Visit OT Evaluation $OT Eval Low Complexity: 1 Low  Terrance Miller OT, MOT  Jayson Person 12/16/2023, 10:42 AM

## 2023-12-16 NOTE — Progress Notes (Signed)
 PROGRESS NOTE    Terrance Miller  FMW:[id number] DOB: January 02, 1929 DOA: 12/08/2023 PCP: Cook, [name], DO   Chief Complaint  Patient presents with   Hypotension    Brief Hospital admission narrative:  As per H&P written by Dr. Manfred on 12/08/2023  Terrance Miller is a 89 y.o. male with medical history significant of hypertension, hyperlipidemia, MGUS, CKD stage III, diet-controlled diabetes mellitus type 2, thrombocytopenia, dementia, HFpEF, coronary disease who presents to the emergency department via EMS due to altered mental status.  She was noted with altered mental status for the SNF yesterday in the evening with heart rate in 30s and patient was hypotensive with SBP in the 70s.   Patient was admitted from 5/28 to 6/2 and presented with similar presentation where he had hypothermia and hypotension as well as C. difficile colitis.   ED course In the emergency department, he was hypothermic with temperature 86.66F, respiratory rate 17, pulse 27 bpm, BP 67/40, O2 sats was 88% on 6 LPM.  Workup in the ED showed macrocytic anemia with MCV of 103.8, hemoglobin 7.0, platelets 14.  BMP shows sodium of 148, potassium 4.8, chloride 117, bicarb 14 blood glucose 118, BUN 77, creatinine 4.18 calcium  8.4, albumin 3.4, AST 128, ALT 149, ALP 151, eGFR 13, anion gap 18 Chest x-ray showed stable cardiomegaly with low lung volumes with mild bibasilar atelectasis Patient was started on IV vancomycin  and cefepime .  IV NS 2 L was provided in the ED Discussion was held with daughter at bedside who wanted to try peripheral IV pressors, but does not want any more invasive procedures and was willing to make patient comfort care if patient does not respond to pressors and antibiotics.  She objected to catheterizing patient to obtain urinalysis.    Assessment & Plan: 1-septic shock - With concerns for UTI - Patient continued to demonstrate the need for peripheral pressors, continue to experience hypothermia, ongoing  hypoglycemia and inability to follow any commands or being able to maintain nutrition on his own. - Extensive discussion with family members at bedside complain overall poor prognosis. - For now continue current therapy without escalating management. - They have confirmed DNR/DNI. - Continue D5/half-normal saline and bicarbonate drip. - Levophed  has been discontinued; vancomycin  also has been stopped. - Continue IV cefepime  at this time to complete a total of 10 days abx's.  2-lactic acidosis/high anion gap metabolic acidosis - Secondary to problem #1 - Up to 3.3 at time of admission - After fluid resuscitation down to 2.1. - Gap up to 18 at time of admission - Planning to repeat blood work in a.m. to further assess patient's stability and determine if there is any further stabilization that can be provided.  3-acute metabolic encephalopathy in the setting of septic shock - Some improvement appreciated in patient's mentation. - Continue supportive care.  4-thrombocytopenia platelets down to 14 - No overt bleeding - Family has expressed no wanting platelets or blood transfusion - Most likely associated with sepsis. - Continue avoiding heparin  products and use SCDs for DVT prophylaxis. - After discussing with family we will check CBC in AM.  5-acute kidney injury on chronic kidney disease - Stage V at baseline  - Creatinine up to 4.8 - Per nursing report almost meeting criteria for anuria - Continue follow response. - Patient's BUN in the 88 range; not a candidate for dialysis.  6-chronic diastolic heart failure - Clinically dehydrated at the moment - In the setting of ongoing renal dysfunction and high risk  for fluid overload.  7-mixed hyperlipidemia - Unable to take oral medications and currently with elevated transaminitis - Continue to hold statin.  8-transaminitis - In the setting of septic shock/shock of liver - Continue fluid resuscitation and follow LFTs  intermittently.  9-acute on chronic anemia  - No overt bleeding appreciated - Some component of hemodilution most likely playing a role - Hgb at transfusion threshold, will give 1 unit of PRBC.  - Continue to follow trend.  10-ethics/social - Goals of care and poor prognosis discussed with family members at bedside - For now continue current therapy without further escalation and maintain DNI/DNR status. - I have discussed with patient's POA ([name]).  She understood inability to continue with peripheral pressors support and is in agreement of not pursuing invasive/artificial treatment..   - After discussing with family today they would like to reassess blood work in the morning and try to replete/transfuse if needed to further assist stabilizing patient. - They would like to continue IV cefepime  for now (day 9/10). - Speech therapy has been requested; patient remain at high risk for aspiration and was not able to properly participate on today's assessment by speech therapy. - Patient has dismissed intervention by palliative care at the moment; they are actively discussing next steps regarding inability to safely swallow process and will get back to use for final decisions regarding discharge plans. -I will anticipate discharge to SNF with hospice.  DVT prophylaxis: SCDs Code Status: DNR/DNI Family Communication: Discussed with patient's daughters and son at bedside. Disposition:   Status is: Inpatient Remains inpatient appropriate because: Continue IV therapy.   Consultants:  Palliative care  Procedures: See below for x-ray reports.  Antimicrobials:  Vancomycin  has been discontinued Cefepime  (date 9 out of 10).  Subjective: Patient's condition remained guarded and his overall prognosis is poor.  He responds to voice commands but is only capable of safely tolerate oral intakes for nutrition or medications at the moment.  Objective: Vitals:   12/16/23 0639 12/16/23 0700  12/16/23 0828 12/16/23 0935  BP:  (!) 99/50    Pulse:  (!) 44  (!) 49  Resp:  17  17  Temp:   98 F (36.7 C)   TempSrc:   Rectal   SpO2:  96%  92%  Weight: 96 kg     Height:        Intake/Output Summary (Last 24 hours) at 12/16/2023 1006 Last data filed at 12/16/2023 0602 Gross per 24 hour  Intake 962.18 ml  Output 400 ml  Net 562.18 ml   Filed Weights   12/08/23 0550 12/13/23 0400 12/16/23 0639  Weight: 82 kg 89.2 kg 96 kg    Examination: General exam: Alert, awake, and is able to respond to voice; still no purposeful movements or inability to safely tolerate oral intake.  No fever. Respiratory system: Good saturation on room air. Cardiovascular system: Rate controlled, no rubs, no gallops, no JVD. Gastrointestinal system: Abdomen is nondistended, soft and nontender. No organomegaly or masses felt. Normal bowel sounds heard. Central nervous system: Limited examination with current mentation; able to move his limbs away from painful stimuli at wiggle his toes and able to squeeze fingers on command. Extremities: No cyanosis or clubbing; trace to 1+ edema appreciated bilaterally (including upper extremities). Skin: No petechiae.  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 Psychiatry: Judgement and insight appear impaired secondary to current mentation.  Data Reviewed: I have personally reviewed following labs and imaging studies  CBC: Recent  Labs  Lab 12/11/23 0440 12/12/23 0426 12/15/23 0428 12/16/23 0844  WBC 5.1 4.6 7.4 7.7  HGB 6.2* 5.9* 6.0* 6.8*  HCT 18.6* 17.5* 18.2* 20.0*  MCV 101.1* 98.3 102.8* 99.0  PLT 18* 25* 53* 86*    Basic Metabolic Panel: Recent Labs  Lab 12/11/23 0440 12/12/23 0426 12/15/23 0428  NA 142 142 145  K 4.0 3.6 3.3*  CL 106 103 100  CO2 20* 23 27  GLUCOSE 87 100* 74  BUN 86* 88* 101*  CREATININE 4.70* 4.79* 4.56*  CALCIUM  7.5* 7.6* 8.0*    GFR: Estimated Creatinine Clearance: 11.4 mL/min (A) (by C-G formula  based on SCr of 4.56 mg/dL (H)).  Liver Function Tests: Recent Labs  Lab 12/15/23 0428  AST 68*  ALT 78*  ALKPHOS 155*  BILITOT 1.7*  PROT 5.9*  ALBUMIN 2.9*    CBG: Recent Labs  Lab 12/15/23 1118 12/15/23 1633 12/15/23 2215 12/15/23 2358 12/16/23 0533  GLUCAP 79 64* 86 75 102*     Recent Results (from the past 240 hours)  MRSA Next Gen by PCR, Nasal     Status: Abnormal   Collection Time: 12/08/23  6:00 AM   Specimen: Nasal Mucosa; Nasal Swab  Result Value Ref Range Status   MRSA by PCR Next Gen DETECTED (A) NOT DETECTED Final    Comment: CRITICAL RESULT CALLED TO, READ BACK BY AND VERIFIED WITH: O PUCKETT AT 0855 ON 12.02.25 BY ADGER J         The GeneXpert MRSA Assay (FDA approved for NASAL specimens only), is one component of a comprehensive MRSA colonization surveillance program. It is not intended to diagnose MRSA infection nor to guide or monitor treatment for MRSA infections. Performed at [facility], [address], [place], [place] [address]   Urine Culture     Status: Abnormal   Collection Time: 12/08/23  2:32 PM   Specimen: Urine, Random  Result Value Ref Range Status   Specimen Description   Final    URINE, RANDOM Performed at [facility], [address], [place], [place] [address]    Special Requests   Final    NONE Reflexed from [id number] Performed at [facility], [address], [place], [place] [address]    Culture (A)  Final    <10,000 COLONIES/mL INSIGNIFICANT GROWTH Performed at [facility], [address] [place], [place] [address]    Report Status 12/09/2023 FINAL  Final    Radiology Studies: No results found.  Scheduled Meds:  Chlorhexidine  Gluconate Cloth  6 each Topical Daily   mupirocin  ointment   Nasal BID   mouth rinse  15 mL Mouth Rinse 4 times per day   scopolamine   1 patch Transdermal Q72H   Continuous Infusions:  ceFEPime  (MAXIPIME ) IV 1 g (12/16/23 0602)   dextrose  5 % and 0.45 % NaCl 40 mL/hr at 12/16/23  0034     LOS: 8 days   Time 50 minutes   Eric Nunnery, MD [facility]  To contact the attending provider between 7A-7P or the covering provider during after hours 7P-7A, please log into the web site [email or web address] and access using universal [place] password for that web site. If you do not have the password, please call the hospital operator.  12/16/2023, 10:06 AM

## 2023-12-16 NOTE — Plan of Care (Signed)
°  Problem: Acute Rehab OT Goals (only OT should resolve) Goal: Pt. Will Perform Eating Flowsheets (Taken 12/16/2023 1045) Pt Will Perform Eating:  with min assist  sitting Goal: Pt. Will Perform Grooming Flowsheets (Taken 12/16/2023 1045) Pt Will Perform Grooming:  with min assist  sitting Goal: Pt. Will Perform Upper Body Dressing Flowsheets (Taken 12/16/2023 1045) Pt Will Perform Upper Body Dressing:  with min assist  sitting Goal: Pt. Will Perform Lower Body Dressing Flowsheets (Taken 12/16/2023 1045) Pt Will Perform Lower Body Dressing:  with mod assist  sitting/lateral leans Goal: Pt. Will Transfer To Toilet Flowsheets (Taken 12/16/2023 1045) Pt Will Transfer to Toilet:  with min assist  with mod assist  stand pivot transfer Goal: Pt. Will Perform Toileting-Clothing Manipulation Flowsheets (Taken 12/16/2023 1045) Pt Will Perform Toileting - Clothing Manipulation and hygiene:  with mod assist  with max assist  bed level Goal: Pt/Caregiver Will Perform Home Exercise Program Flowsheets (Taken 12/16/2023 1045) Pt/caregiver will Perform Home Exercise Program:  Increased strength  Increased ROM  Both right and left upper extremity  With minimal assist  Staisha Winiarski OT, MOT

## 2023-12-16 NOTE — Plan of Care (Signed)
°  Problem: Acute Rehab PT Goals(only PT should resolve) Goal: Pt Will Go Supine/Side To Sit Outcome: Progressing Flowsheets (Taken 12/16/2023 1235) Pt will go Supine/Side to Sit: with moderate assist Goal: Patient Will Transfer Sit To/From Stand Outcome: Progressing Flowsheets (Taken 12/16/2023 1235) Patient will transfer sit to/from stand:  with moderate assist  with maximum assist Goal: Pt Will Transfer Bed To Chair/Chair To Bed Outcome: Progressing Flowsheets (Taken 12/16/2023 1235) Pt will Transfer Bed to Chair/Chair to Bed:  with mod assist  with max assist Goal: Pt Will Ambulate Outcome: Progressing Flowsheets (Taken 12/16/2023 1235) Pt will Ambulate:  10 feet  with moderate assist  with rolling walker   12:36 PM, 12/16/23 Lynwood Music, MPT Physical Therapist with [facility] [phone] office [phone] mobile phone

## 2023-12-16 NOTE — Progress Notes (Signed)
 Spoke with [name] POA at bedside and requested MD to come up and discuss goals of care, at this time [name] relayed to this RN of her desires to possibly seek transfer of patient to and wishes to ensure that palliative be off the table at this time. Message relayed to care team and awaiting MD to bedside.

## 2023-12-16 NOTE — Progress Notes (Signed)
°   12/16/23 1344  Spiritual Encounters  Type of Visit Attempt (pt unavailable)  Conversation partners present during encounter Nurse  Referral source IDT Rounds  Reason for visit Urgent spiritual support  OnCall Visit No   Chaplain attempting to visit with Pt family.  Pt not responsive.  Rn informed me that family may be coming this afternoon.  Will check back later.  Maude Roll, MDiv Chaplain, [facility] [email or web address] [phone] 12/16/2023 1:45 PM

## 2023-12-16 NOTE — Progress Notes (Signed)
 Nutrition Brief Note    RD discussed with MD and RN regarding patient current nutrition plan. Per MD note, they are actively discussing next steps regarding inability to safely swallow process and will get back to use for final decisions regarding discharge plans.  Please consult RD team if needed.    Nestora Glatter RD, LDN Registered Dietitian I Please see AMION for contact information

## 2023-12-17 ENCOUNTER — Inpatient Hospital Stay (HOSPITAL_COMMUNITY)

## 2023-12-17 LAB — CBC
HCT: 21 % — ABNORMAL LOW (ref 39.0–52.0)
Hemoglobin: 7 g/dL — ABNORMAL LOW (ref 13.0–17.0)
MCH: 34.3 pg — ABNORMAL HIGH (ref 26.0–34.0)
MCHC: 33.3 g/dL (ref 30.0–36.0)
MCV: 102.9 fL — ABNORMAL HIGH (ref 80.0–100.0)
Platelets: 101 K/uL — ABNORMAL LOW (ref 150–400)
RBC: 2.04 MIL/uL — ABNORMAL LOW (ref 4.22–5.81)
RDW: 22.3 % — ABNORMAL HIGH (ref 11.5–15.5)
WBC: 7.2 K/uL (ref 4.0–10.5)
nRBC: 2.4 % — ABNORMAL HIGH (ref 0.0–0.2)

## 2023-12-17 LAB — GLUCOSE, CAPILLARY
Glucose-Capillary: 128 mg/dL — ABNORMAL HIGH (ref 70–99)
Glucose-Capillary: 127 mg/dL — ABNORMAL HIGH (ref 70–99)
Glucose-Capillary: 118 mg/dL — ABNORMAL HIGH (ref 70–99)

## 2023-12-17 LAB — BASIC METABOLIC PANEL WITH GFR
Anion gap: 15 (ref 5–15)
BUN: 92 mg/dL — ABNORMAL HIGH (ref 8–23)
CO2: 29 mmol/L (ref 22–32)
Calcium: 8.3 mg/dL — ABNORMAL LOW (ref 8.9–10.3)
Chloride: 103 mmol/L (ref 98–111)
Creatinine, Ser: 3.96 mg/dL — ABNORMAL HIGH (ref 0.61–1.24)
GFR, Estimated: 13 mL/min — ABNORMAL LOW (ref >60)
Glucose, Bld: 110 mg/dL — ABNORMAL HIGH (ref 70–99)
Potassium: 2.9 mmol/L — ABNORMAL LOW (ref 3.5–5.1)
Sodium: 147 mmol/L — ABNORMAL HIGH (ref 135–145)

## 2023-12-17 MED ORDER — ZINC OXIDE 40 % EX OINT
TOPICAL_OINTMENT | Freq: Two times a day (BID) | CUTANEOUS | Status: DC
  Administered 2023-12-17 – 2023-12-29 (×2): 1 via TOPICAL
  Filled 2023-12-17: qty 57

## 2023-12-17 MED ORDER — POTASSIUM CHLORIDE 10 MEQ/100ML IV SOLN
10.0000 meq | INTRAVENOUS | Status: AC
  Administered 2023-12-17 (×3): 10 meq via INTRAVENOUS
  Filled 2023-12-17 (×3): qty 100

## 2023-12-17 MED ORDER — DEXTROSE-SODIUM CHLORIDE 5-0.45 % IV SOLN: INTRAVENOUS | Status: DC

## 2023-12-17 MED ORDER — ZINC OXIDE 40 % EX OINT: TOPICAL_OINTMENT | Freq: Every day | CUTANEOUS | Status: DC | PRN

## 2023-12-17 NOTE — Plan of Care (Signed)
  Problem: Education: Goal: Knowledge of General Education information will improve Description: Including pain rating scale, medication(s)/side effects and non-pharmacologic comfort measures Outcome: Progressing   Problem: Health Behavior/Discharge Planning: Goal: Ability to manage health-related needs will improve Outcome: Progressing   Problem: Clinical Measurements: Goal: Ability to maintain clinical measurements within normal limits will improve Outcome: Progressing Goal: Will remain free from infection Outcome: Progressing Goal: Diagnostic test results will improve Outcome: Progressing Goal: Respiratory complications will improve Outcome: Progressing Goal: Cardiovascular complication will be avoided Outcome: Progressing   Problem: Activity: Goal: Risk for activity intolerance will decrease Outcome: Progressing   Problem: Nutrition: Goal: Adequate nutrition will be maintained Outcome: Progressing   Problem: Coping: Goal: Level of anxiety will decrease Outcome: Progressing   Problem: Elimination: Goal: Will not experience complications related to bowel motility Outcome: Progressing Goal: Will not experience complications related to urinary retention Outcome: Progressing   Problem: Pain Managment: Goal: General experience of comfort will improve and/or be controlled Outcome: Progressing   Problem: Safety: Goal: Ability to remain free from injury will improve Outcome: Progressing   Problem: Skin Integrity: Goal: Risk for impaired skin integrity will decrease Outcome: Progressing   Problem: Fluid Volume: Goal: Hemodynamic stability will improve Outcome: Progressing   Problem: Clinical Measurements: Goal: Diagnostic test results will improve Outcome: Progressing Goal: Signs and symptoms of infection will decrease Outcome: Progressing   Problem: Respiratory: Goal: Ability to maintain adequate ventilation will improve Outcome: Progressing   Problem:  Education: Goal: Ability to demonstrate management of disease process will improve Outcome: Progressing Goal: Ability to verbalize understanding of medication therapies will improve Outcome: Progressing Goal: Individualized Educational Video(s) Outcome: Progressing   Problem: Activity: Goal: Capacity to carry out activities will improve Outcome: Progressing   Problem: Cardiac: Goal: Ability to achieve and maintain adequate cardiopulmonary perfusion will improve Outcome: Progressing

## 2023-12-17 NOTE — Progress Notes (Signed)
 SLP Cancellation Note  Patient Details Name: Terrance Miller MRN: [id number] DOB: 06-03-1928   Cancelled treatment:       Reason Eval/Treat Not Completed: Patient at procedure or test/unavailable;pt in CT when po readiness trial attempted. ST will continue efforts.   Pat Marshall Roehrich,M.S.,CCC-SLP 12/17/2023, 11:49 AM

## 2023-12-17 NOTE — Consult Note (Addendum)
 WOC Nurse Consult Note: Reason for Consult: Consult requested for sacrum.  Performed remotely after review of progress notes and photo in the EMR.  Pt is critically ill with multiple systemic factors which can impair healing. They are on a low airloss mattress to reduce pressure.  Bilat buttocks and sacrum with red, moist macerated skin and patchy areas of full thickness skin loss.   Appearance is consistent with moisture associated skin damage. Darker colored shaggy skin surrounding is consistent with chronic tissue damage and shear, which occurs when a patient spends a large amount of time in a recliner. Small amt bleeding at times is noted.  Sacrum with Stage 2 pressure to sacrum was noted to be present on admission, according to the bedside nurses; wound care flow sheet.   Pressure Injury POA: Yes Dressing procedure/placement/frequency: Topical treatment orders provided for bedside nurses to perform as follows to protect skin and repel moisture: Apply Desitin to bilat buttocks BID and PRN when turning and cleaning.  Please re-consult if further assistance is needed.  Thank-you,  Stephane Fought MSN, RN, CWOCN, CWCN-AP, CNS Contact Mon-Fri 0700-1500: [phone]

## 2023-12-17 NOTE — Progress Notes (Signed)
 PROGRESS NOTE    Terrance Miller  FMW:[id number] DOB: [birth year] DOA: 12/08/2023 PCP: Cook, [name], DO   Chief Complaint  Patient presents with   Hypotension    Brief Hospital admission narrative:  As per H&P written by Dr. Manfred on 12/08/2023  Terrance Miller is a 89 y.o. male with medical history significant of hypertension, hyperlipidemia, MGUS, CKD stage III, diet-controlled diabetes mellitus type 2, thrombocytopenia, dementia, HFpEF, coronary disease who presents to the emergency department via EMS due to altered mental status.  She was noted with altered mental status for the SNF yesterday in the evening with heart rate in 30s and patient was hypotensive with SBP in the 70s.   Patient was admitted from 5/28 to 6/2 and presented with similar presentation where he had hypothermia and hypotension as well as C. difficile colitis.   ED course In the emergency department, he was hypothermic with temperature 86.64F, respiratory rate 17, pulse 27 bpm, BP 67/40, O2 sats was 88% on 6 LPM.  Workup in the ED showed macrocytic anemia with MCV of 103.8, hemoglobin 7.0, platelets 14.  BMP shows sodium of 148, potassium 4.8, chloride 117, bicarb 14 blood glucose 118, BUN 77, creatinine 4.18 calcium  8.4, albumin 3.4, AST 128, ALT 149, ALP 151, eGFR 13, anion gap 18 Chest x-ray showed stable cardiomegaly with low lung volumes with mild bibasilar atelectasis Patient was started on IV vancomycin  and cefepime .  IV NS 2 L was provided in the ED Discussion was held with daughter at bedside who wanted to try peripheral IV pressors, but does not want any more invasive procedures and was willing to make patient comfort care if patient does not respond to pressors and antibiotics.  She objected to catheterizing patient to obtain urinalysis.    Assessment & Plan: 1-septic shock - With concerns for UTI - Patient continued to demonstrate the need for peripheral pressors, continue to experience hypothermia, ongoing  hypoglycemia and inability to follow any commands or being able to maintain nutrition on his own. - Extensive discussion with family members at bedside complain overall poor prognosis. - For now continue current therapy without escalating management. - They have confirmed DNR/DNI. - Continue D5/half-normal saline and bicarbonate drip. - Levophed  has been discontinued; vancomycin  also has been stopped. - Patient completing 10 days of antibiotics after today's dosage - Sepsis features resolved.  2-lactic acidosis/high anion gap metabolic acidosis - Secondary to problem #1 - Up to 3.3 at time of admission - After fluid resuscitation down to 2.1. - Gap up to 18 at time of admission - Planning to repeat blood work in a.m. to further assess patient's stability and determine if there is any further stabilization that can be provided.  3-acute metabolic encephalopathy in the setting of septic shock - Some improvement appreciated in patient's mentation. - Continue supportive care. - Planning for CT head without contrast to rule out any acute stroke component.  4-thrombocytopenia platelets down to 14 (at time of admission) - No overt bleeding - Family has expressed no wanting platelets or blood transfusion - Most likely associated with sepsis. - Continue avoiding heparin  products and use SCDs for DVT prophylaxis. - Most recent platelet count: 101K  5-acute kidney injury on chronic kidney disease - Stage V at baseline  - Creatinine up to 4.8 - Per nursing report almost meeting criteria for anuria - Continue follow response. - Patient's BUN in the 88 range; not a candidate for dialysis.  6-chronic diastolic heart failure - Clinically dehydrated at the  moment - In the setting of ongoing renal dysfunction and high risk for fluid overload.  7-mixed hyperlipidemia - Unable to take oral medications and currently with elevated transaminitis - Continue to hold statin.  8-transaminitis - In  the setting of septic shock/shock of liver - Continue fluid resuscitation and follow LFTs intermittently.  9-acute on chronic anemia  - Some component of hemodilution most likely playing a role - Patient has received 1 unit PRBCs with hemoglobin at 10.0 after. - No overt bleeding appreciated.  10-ethics/social/failure to thrive - Goals of care and poor prognosis discussed with family members at bedside - For now continue current therapy without further escalation and maintain DNI/DNR status. - I have discussed with patient's POA ([name]).  She understood inability to continue with peripheral pressors support and is in agreement of not pursuing invasive/artificial treatment..   - After discussing with family today they would like to reassess blood work in the morning and try to replete/transfuse if needed to further assist stabilizing patient. - Patient has completed IV antibiotics therapy after today's dosage. - Speech therapy has been requested; patient remain at high risk for aspiration and was not able to properly participate on today's assessment by speech therapy. - Patient has dismissed intervention by palliative care at the moment; they are actively discussing next steps regarding inability to safely swallow process and will get back to use for final decisions regarding discharge plans. -I will anticipate discharge to SNF with hospice. - Family has inquired about how CT head to rule out any new complaining of CVA and was also interested to inquire about the possibility to go to an LTAC.  TOC has been contacted and will follow responses. - Family expressing not wanting PEG tubes but has expressed consideration for parenteral nutrition.  DVT prophylaxis: SCDs Code Status: DNR/DNI Family Communication: Discussed with patient's daughters and son at bedside. Disposition:   Status is: Inpatient Remains inpatient appropriate because: Continue IV therapy.   Consultants:  Palliative  care  Procedures: See below for x-ray reports.  Antimicrobials:  Vancomycin  has been discontinued Cefepime  (day 10/10).  Subjective: Overall condition remains guarded, with poor prognosis.  Unable to clear her airways or take by mouth safely.  Patient is afebrile, responsive to voice commands and in no acute distress.  No nausea or vomiting reported.  Objective: Vitals:   12/17/23 0600 12/17/23 0756 12/17/23 0900 12/17/23 1000  BP: (!) 140/67  (!) 140/62 (!) 141/56  Pulse: (!) 57  (!) 56 (!) 44  Resp: 11  11 14   Temp:  (!) 97.3 F (36.3 C)    TempSrc:  Rectal    SpO2: 97%  93% 98%  Weight:      Height:        Intake/Output Summary (Last 24 hours) at 12/17/2023 1104 Last data filed at 12/17/2023 9374 Gross per 24 hour  Intake 1158.9 ml  Output 800 ml  Net 358.9 ml   Filed Weights   12/08/23 0550 12/13/23 0400 12/16/23 0639  Weight: 82 kg 89.2 kg 96 kg    Examination: General exam: Awake, responsive to voice commands; unable to clearly speak/articulate and continues to demonstrate difficulty clearing his airways or adequately having oral intake. Respiratory system: Positive rhonchi; no using accessory muscles.  Good saturation on room air. Cardiovascular system: Rate controlled, no rubs, no gallops, no JVD on exam. Gastrointestinal system: Abdomen is nondistended, soft and nontender. No organomegaly or masses felt. Normal bowel sounds heard. Central nervous system: Limited examination; was  able to wiggle toes and squeeze my fingers on commands.  Unable to raise any of his limbs against gravity. Extremities: No cyanosis or clubbing; 1+ edema appreciated bilaterally (upper and lower extremities). Skin: No petechiae.  Bilateral chronic stasis dermatitis appreciated on his legs. Psychiatry: Limited examination secondary to acute mentation.  No agitation or restlessness.  Data Reviewed: I have personally reviewed following labs and imaging studies  CBC: Recent Labs  Lab  12/11/23 0440 12/12/23 0426 12/15/23 0428 12/16/23 0844 12/17/23 0510  WBC 5.1 4.6 7.4 7.7 7.2  HGB 6.2* 5.9* 6.0* 6.8* 7.0*  HCT 18.6* 17.5* 18.2* 20.0* 21.0*  MCV 101.1* 98.3 102.8* 99.0 102.9*  PLT 18* 25* 53* 86* 101*    Basic Metabolic Panel: Recent Labs  Lab 12/11/23 0440 12/12/23 0426 12/15/23 0428 12/17/23 0510  NA 142 142 145 147*  K 4.0 3.6 3.3* 2.9*  CL 106 103 100 103  CO2 20* 23 27 29   GLUCOSE 87 100* 74 110*  BUN 86* 88* 101* 92*  CREATININE 4.70* 4.79* 4.56* 3.96*  CALCIUM  7.5* 7.6* 8.0* 8.3*    GFR: Estimated Creatinine Clearance: 13.1 mL/min (A) (by C-G formula based on SCr of 3.96 mg/dL (H)).  Liver Function Tests: Recent Labs  Lab 12/15/23 0428  AST 68*  ALT 78*  ALKPHOS 155*  BILITOT 1.7*  PROT 5.9*  ALBUMIN 2.9*    CBG: Recent Labs  Lab 12/16/23 0533 12/16/23 1124 12/16/23 1811 12/16/23 2300 12/17/23 0533  GLUCAP 102* 102* 113* 109* 128*     Recent Results (from the past 240 hours)  MRSA Next Gen by PCR, Nasal     Status: Abnormal   Collection Time: 12/08/23  6:00 AM   Specimen: Nasal Mucosa; Nasal Swab  Result Value Ref Range Status   MRSA by PCR Next Gen DETECTED (A) NOT DETECTED Final    Comment: CRITICAL RESULT CALLED TO, READ BACK BY AND VERIFIED WITH: O PUCKETT AT 0855 ON 12.02.25 BY ADGER J         The GeneXpert MRSA Assay (FDA approved for NASAL specimens only), is one component of a comprehensive MRSA colonization surveillance program. It is not intended to diagnose MRSA infection nor to guide or monitor treatment for MRSA infections. Performed at [facility], [address], [place], [place] [address]   Urine Culture     Status: Abnormal   Collection Time: 12/08/23  2:32 PM   Specimen: Urine, Random  Result Value Ref Range Status   Specimen Description   Final    URINE, RANDOM Performed at [facility], [address], [place], [place] [address]    Special Requests   Final    NONE Reflexed from  [id number] Performed at [facility], [address], [place], [place] [address]    Culture (A)  Final    <10,000 COLONIES/mL INSIGNIFICANT GROWTH Performed at [facility], [address] [place], [place] [address]    Report Status 12/09/2023 FINAL  Final    Radiology Studies: No results found.  Scheduled Meds:  Chlorhexidine  Gluconate Cloth  6 each Topical Daily   mupirocin  ointment   Nasal BID   mouth rinse  15 mL Mouth Rinse 4 times per day   scopolamine   1 patch Transdermal Q72H   Continuous Infusions:  dextrose  5 % and 0.45 % NaCl 40 mL/hr at 12/17/23 0625   potassium chloride  10 mEq (12/17/23 1009)     LOS: 9 days   Time 50 minutes   Eric  Ricky, MD [facility]  To contact the attending provider between 7A-7P or the covering provider during after hours 7P-7A, please log into the web site [email or web address] and access using universal [place] password for that web site. If you do not have the password, please call the hospital operator.  12/17/2023, 11:05 AM

## 2023-12-17 NOTE — Plan of Care (Signed)
°  Problem: Education: Goal: Knowledge of General Education information will improve Description: Including pain rating scale, medication(s)/side effects and non-pharmacologic comfort measures Outcome: Progressing   Problem: Health Behavior/Discharge Planning: Goal: Ability to manage health-related needs will improve Outcome: Progressing   Problem: Clinical Measurements: Goal: Ability to maintain clinical measurements within normal limits will improve Outcome: Progressing Goal: Will remain free from infection Outcome: Progressing Goal: Diagnostic test results will improve Outcome: Progressing Goal: Respiratory complications will improve Outcome: Progressing Goal: Cardiovascular complication will be avoided Outcome: Progressing   Problem: Activity: Goal: Risk for activity intolerance will decrease Outcome: Progressing   Problem: Nutrition: Goal: Adequate nutrition will be maintained Outcome: Progressing   Problem: Coping: Goal: Level of anxiety will decrease Outcome: Progressing   Problem: Elimination: Goal: Will not experience complications related to bowel motility Outcome: Progressing Goal: Will not experience complications related to urinary retention Outcome: Progressing   Problem: Pain Managment: Goal: General experience of comfort will improve and/or be controlled Outcome: Progressing   Problem: Safety: Goal: Ability to remain free from injury will improve Outcome: Progressing   Problem: Skin Integrity: Goal: Risk for impaired skin integrity will decrease Outcome: Progressing   Problem: Fluid Volume: Goal: Hemodynamic stability will improve Outcome: Progressing   Problem: Clinical Measurements: Goal: Diagnostic test results will improve Outcome: Progressing Goal: Signs and symptoms of infection will decrease Outcome: Progressing   Problem: Respiratory: Goal: Ability to maintain adequate ventilation will improve Outcome: Progressing   Problem:  Education: Goal: Ability to demonstrate management of disease process will improve Outcome: Progressing Goal: Ability to verbalize understanding of medication therapies will improve Outcome: Progressing Goal: Individualized Educational Video(s) Outcome: Progressing   Problem: Activity: Goal: Capacity to carry out activities will improve Outcome: Progressing   Problem: Cardiac: Goal: Ability to achieve and maintain adequate cardiopulmonary perfusion will improve Outcome: Progressing

## 2023-12-18 LAB — PREPARE RBC (CROSSMATCH)

## 2023-12-18 LAB — BASIC METABOLIC PANEL WITH GFR
Anion gap: 20 — ABNORMAL HIGH (ref 5–15)
BUN: 82 mg/dL — ABNORMAL HIGH (ref 8–23)
CO2: 23 mmol/L (ref 22–32)
Calcium: 8.5 mg/dL — ABNORMAL LOW (ref 8.9–10.3)
Chloride: 106 mmol/L (ref 98–111)
Creatinine, Ser: 3.66 mg/dL — ABNORMAL HIGH (ref 0.61–1.24)
GFR, Estimated: 15 mL/min — ABNORMAL LOW (ref >60)
Glucose, Bld: 119 mg/dL — ABNORMAL HIGH (ref 70–99)
Potassium: 2.8 mmol/L — ABNORMAL LOW (ref 3.5–5.1)
Sodium: 148 mmol/L — ABNORMAL HIGH (ref 135–145)

## 2023-12-18 LAB — CBC
HCT: 21 % — ABNORMAL LOW (ref 39.0–52.0)
Hemoglobin: 6.9 g/dL — CL (ref 13.0–17.0)
MCH: 33.5 pg (ref 26.0–34.0)
MCHC: 32.9 g/dL (ref 30.0–36.0)
MCV: 101.9 fL — ABNORMAL HIGH (ref 80.0–100.0)
Platelets: 121 K/uL — ABNORMAL LOW (ref 150–400)
RBC: 2.06 MIL/uL — ABNORMAL LOW (ref 4.22–5.81)
RDW: 22.1 % — ABNORMAL HIGH (ref 11.5–15.5)
WBC: 6.2 K/uL (ref 4.0–10.5)
nRBC: 3.1 % — ABNORMAL HIGH (ref 0.0–0.2)

## 2023-12-18 LAB — GLUCOSE, CAPILLARY
Glucose-Capillary: 109 mg/dL — ABNORMAL HIGH (ref 70–99)
Glucose-Capillary: 126 mg/dL — ABNORMAL HIGH (ref 70–99)

## 2023-12-18 LAB — MAGNESIUM: Magnesium: 2.3 mg/dL (ref 1.7–2.4)

## 2023-12-18 MED ORDER — POTASSIUM CHLORIDE CRYS ER 20 MEQ PO TBCR: 40.0000 meq | EXTENDED_RELEASE_TABLET | ORAL | Status: AC

## 2023-12-18 MED ORDER — SODIUM CHLORIDE 0.9% IV SOLUTION: Freq: Once | INTRAVENOUS | Status: AC

## 2023-12-18 MED ORDER — KCL IN DEXTROSE-NACL 20-5-0.45 MEQ/L-%-% IV SOLN: INTRAVENOUS | Status: DC

## 2023-12-18 NOTE — Plan of Care (Signed)
°  Problem: Education: Goal: Knowledge of General Education information will improve Description: Including pain rating scale, medication(s)/side effects and non-pharmacologic comfort measures Outcome: Progressing   Problem: Health Behavior/Discharge Planning: Goal: Ability to manage health-related needs will improve Outcome: Progressing   Problem: Clinical Measurements: Goal: Ability to maintain clinical measurements within normal limits will improve Outcome: Progressing Goal: Will remain free from infection Outcome: Progressing Goal: Diagnostic test results will improve Outcome: Progressing Goal: Respiratory complications will improve Outcome: Progressing Goal: Cardiovascular complication will be avoided Outcome: Progressing   Problem: Activity: Goal: Risk for activity intolerance will decrease Outcome: Progressing   Problem: Nutrition: Goal: Adequate nutrition will be maintained Outcome: Progressing   Problem: Coping: Goal: Level of anxiety will decrease Outcome: Progressing   Problem: Elimination: Goal: Will not experience complications related to bowel motility Outcome: Progressing Goal: Will not experience complications related to urinary retention Outcome: Progressing   Problem: Pain Managment: Goal: General experience of comfort will improve and/or be controlled Outcome: Progressing   Problem: Safety: Goal: Ability to remain free from injury will improve Outcome: Progressing   Problem: Skin Integrity: Goal: Risk for impaired skin integrity will decrease Outcome: Progressing   Problem: Fluid Volume: Goal: Hemodynamic stability will improve Outcome: Progressing   Problem: Clinical Measurements: Goal: Diagnostic test results will improve Outcome: Progressing Goal: Signs and symptoms of infection will decrease Outcome: Progressing   Problem: Respiratory: Goal: Ability to maintain adequate ventilation will improve Outcome: Progressing   Problem:  Education: Goal: Ability to demonstrate management of disease process will improve Outcome: Progressing Goal: Ability to verbalize understanding of medication therapies will improve Outcome: Progressing Goal: Individualized Educational Video(s) Outcome: Progressing   Problem: Activity: Goal: Capacity to carry out activities will improve Outcome: Progressing   Problem: Cardiac: Goal: Ability to achieve and maintain adequate cardiopulmonary perfusion will improve Outcome: Progressing

## 2023-12-18 NOTE — Progress Notes (Signed)
 Speech Language Pathology Treatment: Dysphagia  Patient Details Name: Terrance Miller MRN: [id number] DOB: 1928/07/22 Today's Date: 12/18/2023 Time: 9064-8042 SLP Time Calculation (min) (ACUTE ONLY): 622 min  Assessment / Plan / Recommendation Clinical Impression  Ongoing diagnostic dysphagia therapy provided; Pt was roused with mod/max tactile and verbal cues and responded to SLP with grunting and opening his eyes. Pt's mouth is ajar at rest; oral care recently completed by RN. Pt accepted single ice chips and demonstrated oral manipulation and a relatively timely swallow with 2-3 ice chips. Pt then became less responsive and not appropriate for additional trials. Recommend continue NPO status with frequent oral care and single ice chips to be provided ONLY when Pt is alert and responsive. ST will continue efforts,   HPI HPI: 89 y.o. male with past medical history of dementia, HTN, HLD, CKDIV, DM2 admitted on 12/08/2023 with septic shock due to UTI- required pressors but they are off now, AKI. Mental status is recovering poorly. BSE requested and recommended NPO.  ST f/u for po readiness.      SLP Plan  Continue with current plan of care        Swallow Evaluation Recommendations   Recommendations: NPO;Ice chips PRN after oral care Liquid Administration via: Other (Comment) Medication Administration: Crushed with puree Supervision: Patient able to self-feed Postural changes: Stay upright 30-60 min after meals Oral care recommendations: Oral care QID (4x/day)     Recommendations                  Oral care QID;Staff/trained caregiver to provide oral care     Dysphagia, unspecified (R13.10)     Continue with current plan of care    Terrance Miller H. Terrance Miller, CCC-SLP Speech Language Pathologist  Terrance Miller Terrance  12/18/2023, 10:11 AM

## 2023-12-18 NOTE — Progress Notes (Signed)
 PROGRESS NOTE  Terrance Miller  FMW:[id number] DOB: 01-31-1928 DOA: 12/08/2023 PCP: Cook, [name], DO   Chief Complaint  Patient presents with   Hypotension    Brief Hospital admission narrative:   89 y.o. male with medical history significant of HTN, HLD, MGUS, CKD stage III, DM2, thrombocytopenia, dementia, HFpEF, coronary disease who presents to the emergency department via EMS due to altered mental status.  She was noted with altered mental status for the SNF yesterday in the evening with heart rate in 30s and patient was hypotensive with SBP in the 70s.   Patient was admitted from 5/28 to 6/2 and presented with similar presentation where he had hypothermia and hypotension as well as C. difficile colitis.   ED course In the emergency department, he was hypothermic with temperature 86.93F, respiratory rate 17, pulse 27 bpm, BP 67/40, O2 sats was 88% on 6 LPM.  Workup in the ED showed macrocytic anemia with MCV of 103.8, hemoglobin 7.0, platelets 14.  BMP shows sodium of 148, potassium 4.8, chloride 117, bicarb 14 blood glucose 118, BUN 77, creatinine 4.18 calcium  8.4, albumin 3.4, AST 128, ALT 149, ALP 151, eGFR 13, anion gap 18 Chest x-ray showed stable cardiomegaly with low lung volumes with mild bibasilar atelectasis Patient was started on IV vancomycin  and cefepime .  IV NS 2 L was provided in the ED Discussion was held with daughter at bedside who wanted to try peripheral IV pressors, but does not want any more invasive procedures and was willing to make patient comfort care if patient does not respond to pressors and antibiotics.  She objected to catheterizing patient to obtain urinalysis.   Assessment & Plan: 1)SIRS ---SIRS pathophysiology has resolved Hypotension and Hypothermia resolved Off Levophed  Urine cx neg, chest x-ray without definite pneumonia No further hypoglycemia while on iv dextrose - --completed at least 10 days of broad-spectrum antibiotics already -- 2) acute on  chronic anemia --no obvious bleeding noted - Hgb down to 6.9 - Transfuse 1 unit of PRBC on 12/18/2023--for total of 2 units of PRBC this admission  3-acute metabolic encephalopathy in the setting of SIRS - -CT head without acute finding --Patient remains with very poor cognition - Check serum ammonia  4-thrombocytopenia platelets down to 14 (at time of admission) - No overt bleeding - - Most likely associated with sepsis. - Continue avoiding heparin  products and use SCDs for DVT prophylaxis. - Most recent platelet count: > 120 K  5-acute kidney injury on chronic kidney disease V - Stage V at baseline  -Continue to monitor urine output -Creatinine currently less than 4 - Poor functional status with underlying dementia and deemed not a candidate for dialysis.  6-chronic diastolic heart failure - Be judicious with IV fluid  7-Hypernatremia--due to dehydration in the setting of lack of oral intake.  8-Transaminitis - In the setting of shock of liver - Continue fluid resuscitation and follow LFTs intermittently.  9-history of vascular dementia--per daughter [name]--- patient's functional status declined prior to admission to the point of pocketing food and not being able to swallow and eat safely C/n supportive Rx -- 10)ethics/social/failure to thrive - Goals of care and poor prognosis previously discussed with family members  -Further conversations with patient's daughter [name]---[phone] on 12/18/2023 --Patient remains DNR/DNI- I have discussed with patient's POA ([name]).   -Family wants to hold off on palliative or hospice input at this time --- Patient not a candidate for PEG tube given underlying dementia - Insurance declined possible transfer  to LTAC  11)FEN/Nutrition.Hypernatremia--give hypotonic dextrose  solution - Speech pathologist eval appreciated, recommends n.p.o. status as patient unable to tolerate oral intake  12)Hypokalemia--replace IV and  recheck   DVT prophylaxis: SCDs Code Status: DNR/DNI Family Communication: Discussed with Daughter [name] . Disposition: ?? Hospice appropriate  Status is: Inpatient    Consultants:  Palliative care  Procedures: See below for x-ray reports.  Antimicrobials:  Vancomycin  has been discontinued Cefepime  (day 10/10).  Subjective: Failed swallow eval - Remains largely lethargic - No fevers, no emesis  Objective: Vitals:   12/18/23 1111 12/18/23 1121 12/18/23 1330 12/18/23 1705  BP:  (!) 146/97 (!) 146/70   Pulse: (!) 58 (!) 58    Resp: 16 16    Temp: 97.8 F (36.6 C) 98.3 F (36.8 C) 97.9 F (36.6 C) 97.7 F (36.5 C)  TempSrc: Axillary Axillary Axillary Axillary  SpO2: 98% 99%    Weight:      Height:        Intake/Output Summary (Last 24 hours) at 12/18/2023 1751 Last data filed at 12/18/2023 1330 Gross per 24 hour  Intake 1317 ml  Output 600 ml  Net 717 ml   Filed Weights   12/08/23 0550 12/13/23 0400 12/16/23 0639  Weight: 82 kg 89.2 kg 96 kg    Physical Exam  Gen:-Sleepy but arousable, keeps mouth open most of the time HEENT:- [place].AT, No sclera icterus Neck-Supple Neck,No JVD,.  Lungs-  CTAB , fair air movement bilaterally  CV- S1, S2 normal, RRR Abd-  +ve B.Sounds, Abd Soft, No tenderness,    Extremity/Skin:- No  edema,   good pedal pulses  Psych-affect is flat, nonverbal, largely sleepy and not communicative Neuro-generalized weakness, unable to follow instructions or commands for the most part , no new focal deficits, no tremors  Data Reviewed: I have personally reviewed following labs and imaging studies  CBC: Recent Labs  Lab 12/12/23 0426 12/15/23 0428 12/16/23 0844 12/17/23 0510 12/18/23 0545  WBC 4.6 7.4 7.7 7.2 6.2  HGB 5.9* 6.0* 6.8* 7.0* 6.9*  HCT 17.5* 18.2* 20.0* 21.0* 21.0*  MCV 98.3 102.8* 99.0 102.9* 101.9*  PLT 25* 53* 86* 101* 121*    Basic Metabolic Panel: Recent Labs  Lab 12/12/23 0426  12/15/23 0428 12/17/23 0510 12/18/23 0514 12/18/23 0545  NA 142 145 147*  --  148*  K 3.6 3.3* 2.9*  --  2.8*  CL 103 100 103  --  106  CO2 23 27 29   --  23  GLUCOSE 100* 74 110*  --  119*  BUN 88* 101* 92*  --  82*  CREATININE 4.79* 4.56* 3.96*  --  3.66*  CALCIUM  7.6* 8.0* 8.3*  --  8.5*  MG  --   --   --  2.3  --    GFR: Estimated Creatinine Clearance: 14.2 mL/min (A) (by C-G formula based on SCr of 3.66 mg/dL (H)).  Liver Function Tests: Recent Labs  Lab 12/15/23 0428  AST 68*  ALT 78*  ALKPHOS 155*  BILITOT 1.7*  PROT 5.9*  ALBUMIN 2.9*   CBG: Recent Labs  Lab 12/16/23 2300 12/17/23 0533 12/17/23 1107 12/17/23 2321 12/18/23 0514  GLUCAP 109* 128* 127* 118* 126*   Radiology Studies: CT HEAD WO CONTRAST ( ) Result Date: 12/17/2023 EXAM: CT Head Without Intravenous Contrast. CLINICAL HISTORY: 89 year old male with acute neurological deficit, stroke suspected. TECHNIQUE: Axial computed tomography images of the head/brain without intravenous contrast. Dose reduction technique was used including one or more of  the following: automated exposure control, adjustment of mA and kV according to patient size, and/or iterative reconstruction. CONTRAST: Without. COMPARISON: Brain MRI 05/11/2023, Head CT 05/28/2023. FINDINGS: LIMITATIONS/ARTIFACTS: Mild motion artifact today. BRAIN: No acute intraparenchymal hemorrhage. No mass lesion. No CT evidence for acute territorial infarct. No midline shift or extra-axial collection. Stable brain volume. Gray white matter differentiation stable and normal for age. Incidental dural calcification. No suspicious intracranial vascular hyperdensity. Advanced calcified atherosclerosis at the central skull base. VENTRICLES: No hydrocephalus. ORBITS: The orbits are unremarkable. SINUSES AND MASTOIDS: Chronic paranasal sinus disease with mucoperiosteal thickening but improved compared to 05/28/2023. Tympanic cavities and right mastoids are clear. Left  mastoid effusion has also regressed since 05/28/2023. SOFT TISSUES: Calcified scalp vessel atherosclerosis. No acute orbit or scalp soft tissue finding. No radiopaque foreign body is seen. BONES: No acute skull fracture. IMPRESSION: 1. Stable and negative for age non-contrast head CT appearance of the brain. 2. Advanced calcified atherosclerosis 3. Chronic paranasal sinus disease improved since May. Electronically signed by: Helayne Hurst MD 12/17/2023 12:38 PM EST RP Workstation: [id number]   Scheduled Meds:  Chlorhexidine  Gluconate Cloth  6 each Topical Daily   liver oil-zinc oxide   Topical BID   mupirocin  ointment   Nasal BID   mouth rinse  15 mL Mouth Rinse 4 times per day   scopolamine   1 patch Transdermal Q72H   Continuous Infusions:  dextrose  5 % and 0.45 % NaCl 40 mL/hr at 12/18/23 0638     LOS: 10 days   Rendall Carwin, MD [facility]  To contact the attending provider between 7A-7P or the covering provider during after hours 7P-7A, please log into the web site [email or web address] and access using universal [place] password for that web site. If you do not have the password, please call the hospital operator.  12/18/2023, 5:51 PM

## 2023-12-19 LAB — BPAM RBC
Blood Product Expiration Date: 202512222359
Blood Product Expiration Date: 202512222359
ISSUE DATE / TIME: 202512090835
ISSUE DATE / TIME: 202512121057
Unit Type and Rh: 6200
Unit Type and Rh: 6200

## 2023-12-19 LAB — COMPREHENSIVE METABOLIC PANEL WITH GFR
ALT: 47 U/L — ABNORMAL HIGH (ref 0–44)
AST: 40 U/L (ref 15–41)
Albumin: 3.1 g/dL — ABNORMAL LOW (ref 3.5–5.0)
Alkaline Phosphatase: 135 U/L — ABNORMAL HIGH (ref 38–126)
Anion gap: 12 (ref 5–15)
BUN: 76 mg/dL — ABNORMAL HIGH (ref 8–23)
CO2: 29 mmol/L (ref 22–32)
Calcium: 8.6 mg/dL — ABNORMAL LOW (ref 8.9–10.3)
Chloride: 107 mmol/L (ref 98–111)
Creatinine, Ser: 3.39 mg/dL — ABNORMAL HIGH (ref 0.61–1.24)
GFR, Estimated: 16 mL/min — ABNORMAL LOW (ref >60)
Glucose, Bld: 117 mg/dL — ABNORMAL HIGH (ref 70–99)
Potassium: 2.8 mmol/L — ABNORMAL LOW (ref 3.5–5.1)
Sodium: 148 mmol/L — ABNORMAL HIGH (ref 135–145)
Total Bilirubin: 1.9 mg/dL — ABNORMAL HIGH (ref 0.0–1.2)
Total Protein: 6.2 g/dL — ABNORMAL LOW (ref 6.5–8.1)

## 2023-12-19 LAB — CBC
HCT: 24.6 % — ABNORMAL LOW (ref 39.0–52.0)
Hemoglobin: 8 g/dL — ABNORMAL LOW (ref 13.0–17.0)
MCH: 33.8 pg (ref 26.0–34.0)
MCHC: 32.5 g/dL (ref 30.0–36.0)
MCV: 103.8 fL — ABNORMAL HIGH (ref 80.0–100.0)
Platelets: 119 K/uL — ABNORMAL LOW (ref 150–400)
RBC: 2.37 MIL/uL — ABNORMAL LOW (ref 4.22–5.81)
RDW: 23 % — ABNORMAL HIGH (ref 11.5–15.5)
WBC: 5.9 K/uL (ref 4.0–10.5)
nRBC: 4.1 % — ABNORMAL HIGH (ref 0.0–0.2)

## 2023-12-19 LAB — TYPE AND SCREEN
ABO/RH(D): A POS
Antibody Screen: NEGATIVE
Unit division: 0
Unit division: 0

## 2023-12-19 LAB — GLUCOSE, CAPILLARY
Glucose-Capillary: 105 mg/dL — ABNORMAL HIGH (ref 70–99)
Glucose-Capillary: 116 mg/dL — ABNORMAL HIGH (ref 70–99)
Glucose-Capillary: 140 mg/dL — ABNORMAL HIGH (ref 70–99)
Glucose-Capillary: 156 mg/dL — ABNORMAL HIGH (ref 70–99)

## 2023-12-19 LAB — AMMONIA: Ammonia: 45 umol/L — ABNORMAL HIGH (ref 9–35)

## 2023-12-19 MED ORDER — DEXTROSE 5 % IV SOLN: INTRAVENOUS | Status: AC

## 2023-12-19 MED ORDER — DEXTROSE 5 % IV SOLN: INTRAVENOUS | Status: DC

## 2023-12-19 MED ORDER — KCL IN DEXTROSE-NACL 20-5-0.45 MEQ/L-%-% IV SOLN: INTRAVENOUS | Status: DC

## 2023-12-19 MED ORDER — POTASSIUM CHLORIDE 10 MEQ/100ML IV SOLN
10.0000 meq | INTRAVENOUS | Status: AC
  Administered 2023-12-19 (×4): 10 meq via INTRAVENOUS
  Filled 2023-12-19 (×3): qty 100

## 2023-12-19 NOTE — Progress Notes (Signed)
 PROGRESS NOTE  Terrance Miller  FMW:[id number] DOB: 06-Mar-1928 DOA: 12/08/2023 PCP: Cook, [name], DO   Chief Complaint  Patient presents with   Hypotension    Brief Hospital admission narrative:   89 y.o. male with medical history significant of HTN, HLD, MGUS, CKD stage III, DM2, thrombocytopenia, dementia, HFpEF, coronary disease who presents to the emergency department via EMS due to altered mental status.  She was noted with altered mental status for the SNF yesterday in the evening with heart rate in 30s and patient was hypotensive with SBP in the 70s.   Patient was admitted from 5/28 to 6/2 and presented with similar presentation where he had hypothermia and hypotension as well as C. difficile colitis.   ED course In the emergency department, he was hypothermic with temperature 86.4F, respiratory rate 17, pulse 27 bpm, BP 67/40, O2 sats was 88% on 6 LPM.  Workup in the ED showed macrocytic anemia with MCV of 103.8, hemoglobin 7.0, platelets 14.  BMP shows sodium of 148, potassium 4.8, chloride 117, bicarb 14 blood glucose 118, BUN 77, creatinine 4.18 calcium  8.4, albumin 3.4, AST 128, ALT 149, ALP 151, eGFR 13, anion gap 18 Chest x-ray showed stable cardiomegaly with low lung volumes with mild bibasilar atelectasis Patient was started on IV vancomycin  and cefepime .  IV NS 2 L was provided in the ED Discussion was held with daughter at bedside who wanted to try peripheral IV pressors, but does not want any more invasive procedures and was willing to make patient comfort care if patient does not respond to pressors and antibiotics.  She objected to catheterizing patient to obtain urinalysis.   Assessment & Plan: 1)SIRS ---SIRS pathophysiology has resolved Hypotension and Hypothermia resolved Off Levophed  Urine cx neg, chest x-ray without definite pneumonia No further hypoglycemia while on iv dextrose - --completed at least 10 days of broad-spectrum antibiotics already -- 2) acute on  chronic anemia --no obvious bleeding noted - Hgb is up to 8.0 from 6.9 after transfusion--, H&H may drop further due to I dextrose  iV fluids/hemodilution - Transfuse 1 unit of PRBC on 12/18/2023--for total of 2 units of PRBC this admission  3-acute metabolic encephalopathy in the setting of SIRS - -CT head without acute finding --Patient remains with very poor cognition -Mild elevation of serum ammonia and mild hyponatremia noted  4-thrombocytopenia --platelets was down to 14 (at time of admission) - No overt bleeding - - Most likely associated with sepsis. - Continue avoiding heparin  products and use SCDs for DVT prophylaxis. - Most recent platelet count: > 120 K  5-acute kidney injury on chronic kidney disease V - Stage V at baseline  -Continue to monitor urine output -Creatinine currently less than 4 (creatinine peaked at 4.79 this admission) - Poor functional status with underlying dementia and deemed not a candidate for dialysis.  6-chronic diastolic heart failure -Patient unable to take oral medications safely at this time - Be judicious with IV fluid--persistent hypoglycemia and inability to safely take oral intake necessitates ongoing IV dextrose  infusion  7-Hypernatremia--due to dehydration in the setting of lack of oral intake.  8-Transaminitis - In the setting of shock of liver - Continue fluid resuscitation and follow LFTs intermittently.  9-history of vascular dementia--per daughter [name]--- patient's functional status declined prior to admission to the point of pocketing food and not being able to swallow and eat safely C/n supportive Rx -- 10)ethics/social/failure to thrive - Goals of care and poor prognosis previously discussed with family members  -Further conversations  with patient's daughter [name]---[phone] on 12/18/2023 --Patient remains DNR/DNI- I have discussed with patient's POA ([name]).   -Family wants to hold off on palliative or  hospice input at this time --- Patient not a candidate for PEG tube given underlying dementia - Insurance declined possible transfer to LTAC  11)FEN/Nutrition.Hypernatremia---consider changing to D5 water  from D5 half-normal - Speech pathologist eval appreciated, recommends n.p.o. status as patient unable to tolerate oral intake  12)Hypokalemia--replace IV and recheck   DVT prophylaxis: SCDs Code Status: DNR/DNI Family Communication: Discussed with Daughter [name] . Disposition: ?? Hospice appropriate  Status is: Inpatient    Consultants:  Palliative care  Procedures: See below for x-ray reports.  Antimicrobials:  Vancomycin  has been discontinued Completed 10 days of cefepime    Subjective: - Sleepy but arousable - Not really able to follow instructions - Unable to tolerate oral intake - Excessive salivation improving with scopolamine  patch  Objective: Vitals:   12/18/23 2041 12/19/23 0022 12/19/23 0513 12/19/23 1228  BP: 125/76 (!) 153/77 (!) 143/86 (!) 142/79  Pulse: 60 (!) 58 60 74  Resp:  18 18 18   Temp: 97.9 F (36.6 C) 97.7 F (36.5 C) (!) 97.5 F (36.4 C) 97.7 F (36.5 C)  TempSrc: Axillary Oral Oral Axillary  SpO2: 98% 96% 98% 95%  Weight:      Height:        Intake/Output Summary (Last 24 hours) at 12/19/2023 1728 Last data filed at 12/19/2023 0254 Gross per 24 hour  Intake 422.5 ml  Output 200 ml  Net 222.5 ml   Filed Weights   12/08/23 0550 12/13/23 0400 12/16/23 0639  Weight: 82 kg 89.2 kg 96 kg    Physical Exam Gen:-Sleepy but arousable, keeps mouth open most of the time HEENT:- [place].AT, No sclera icterus Neck-Supple Neck,No JVD,.  Lungs-  CTAB , fair air movement bilaterally  CV- S1, S2 normal, RRR Abd-  +ve B.Sounds, Abd Soft, No tenderness,    Extremity/Skin:- No  edema,   good pedal pulses  Psych-affect is flat, nonverbal, largely sleepy and not communicative Neuro-generalized weakness, unable to follow  instructions or commands for the most part , no new focal deficits, no tremors  Data Reviewed: I have personally reviewed following labs and imaging studies  CBC: Recent Labs  Lab 12/15/23 0428 12/16/23 0844 12/17/23 0510 12/18/23 0545 12/19/23 0608  WBC 7.4 7.7 7.2 6.2 5.9  HGB 6.0* 6.8* 7.0* 6.9* 8.0*  HCT 18.2* 20.0* 21.0* 21.0* 24.6*  MCV 102.8* 99.0 102.9* 101.9* 103.8*  PLT 53* 86* 101* 121* 119*    Basic Metabolic Panel: Recent Labs  Lab 12/15/23 0428 12/17/23 0510 12/18/23 0514 12/18/23 0545 12/19/23 0608  NA 145 147*  --  148* 148*  K 3.3* 2.9*  --  2.8* 2.8*  CL 100 103  --  106 107  CO2 27 29  --  23 29  GLUCOSE 74 110*  --  119* 117*  BUN 101* 92*  --  82* 76*  CREATININE 4.56* 3.96*  --  3.66* 3.39*  CALCIUM  8.0* 8.3*  --  8.5* 8.6*  MG  --   --  2.3  --   --    GFR: Estimated Creatinine Clearance: 15.3 mL/min (A) (by C-G formula based on SCr of 3.39 mg/dL (H)).  Liver Function Tests: Recent Labs  Lab 12/15/23 0428 12/19/23 0608  AST 68* 40  ALT 78* 47*  ALKPHOS 155* 135*  BILITOT 1.7* 1.9*  PROT 5.9* 6.2*  ALBUMIN 2.9* 3.1*   CBG: Recent Labs  Lab 12/17/23 2321 12/18/23 0514 12/18/23 2341 12/19/23 0602 12/19/23 1124  GLUCAP 118* 126* 109* 105* 116*   Radiology Studies: No results found.  Scheduled Meds:  Chlorhexidine  Gluconate Cloth  6 each Topical Daily   liver oil-zinc  oxide   Topical BID   mupirocin  ointment   Nasal BID   mouth rinse  15 mL Mouth Rinse 4 times per day   scopolamine   1 patch Transdermal Q72H   Continuous Infusions:  dextrose  5 % and 0.45 % NaCl with KCl 20 mEq/L 75 mL/hr at 12/19/23 1223     LOS: 11 days   Rendall Carwin, MD [facility]  To contact the attending provider between 7A-7P or the covering provider during after hours 7P-7A, please log into the web site [email or web address] and access using universal [place] password for that web site. If you do not have the password, please call the  hospital operator.  12/19/2023, 5:28 PM

## 2023-12-19 NOTE — Plan of Care (Signed)

## 2023-12-19 NOTE — Plan of Care (Signed)
°  Problem: Education: Goal: Knowledge of General Education information will improve Description: Including pain rating scale, medication(s)/side effects and non-pharmacologic comfort measures Outcome: Progressing   Problem: Health Behavior/Discharge Planning: Goal: Ability to manage health-related needs will improve Outcome: Progressing   Problem: Clinical Measurements: Goal: Ability to maintain clinical measurements within normal limits will improve Outcome: Progressing Goal: Will remain free from infection Outcome: Progressing Goal: Diagnostic test results will improve Outcome: Progressing Goal: Respiratory complications will improve Outcome: Progressing Goal: Cardiovascular complication will be avoided Outcome: Progressing   Problem: Activity: Goal: Risk for activity intolerance will decrease Outcome: Progressing   Problem: Nutrition: Goal: Adequate nutrition will be maintained Outcome: Progressing   Problem: Coping: Goal: Level of anxiety will decrease Outcome: Progressing   Problem: Elimination: Goal: Will not experience complications related to bowel motility Outcome: Progressing Goal: Will not experience complications related to urinary retention Outcome: Progressing   Problem: Pain Managment: Goal: General experience of comfort will improve and/or be controlled Outcome: Progressing   Problem: Safety: Goal: Ability to remain free from injury will improve Outcome: Progressing   Problem: Skin Integrity: Goal: Risk for impaired skin integrity will decrease Outcome: Progressing   Problem: Fluid Volume: Goal: Hemodynamic stability will improve Outcome: Progressing   Problem: Clinical Measurements: Goal: Diagnostic test results will improve Outcome: Progressing Goal: Signs and symptoms of infection will decrease Outcome: Progressing   Problem: Respiratory: Goal: Ability to maintain adequate ventilation will improve Outcome: Progressing   Problem:  Education: Goal: Ability to demonstrate management of disease process will improve Outcome: Progressing Goal: Ability to verbalize understanding of medication therapies will improve Outcome: Progressing Goal: Individualized Educational Video(s) Outcome: Progressing   Problem: Activity: Goal: Capacity to carry out activities will improve Outcome: Progressing   Problem: Cardiac: Goal: Ability to achieve and maintain adequate cardiopulmonary perfusion will improve Outcome: Progressing

## 2023-12-20 DIAGNOSIS — R6521 Severe sepsis with septic shock: Secondary | ICD-10-CM | POA: Diagnosis not present

## 2023-12-20 DIAGNOSIS — A419 Sepsis, unspecified organism: Secondary | ICD-10-CM | POA: Diagnosis not present

## 2023-12-20 LAB — COMPREHENSIVE METABOLIC PANEL WITH GFR
ALT: 43 U/L (ref 0–44)
AST: 42 U/L — ABNORMAL HIGH (ref 15–41)
Albumin: 3.1 g/dL — ABNORMAL LOW (ref 3.5–5.0)
Alkaline Phosphatase: 125 U/L (ref 38–126)
Anion gap: 10 (ref 5–15)
BUN: 67 mg/dL — ABNORMAL HIGH (ref 8–23)
CO2: 29 mmol/L (ref 22–32)
Calcium: 8.6 mg/dL — ABNORMAL LOW (ref 8.9–10.3)
Chloride: 108 mmol/L (ref 98–111)
Creatinine, Ser: 2.84 mg/dL — ABNORMAL HIGH (ref 0.61–1.24)
GFR, Estimated: 20 mL/min — ABNORMAL LOW (ref >60)
Glucose, Bld: 146 mg/dL — ABNORMAL HIGH (ref 70–99)
Potassium: 2.9 mmol/L — ABNORMAL LOW (ref 3.5–5.1)
Sodium: 146 mmol/L — ABNORMAL HIGH (ref 135–145)
Total Bilirubin: 1.8 mg/dL — ABNORMAL HIGH (ref 0.0–1.2)
Total Protein: 6.1 g/dL — ABNORMAL LOW (ref 6.5–8.1)

## 2023-12-20 LAB — URINALYSIS, ROUTINE W REFLEX MICROSCOPIC
Bilirubin Urine: NEGATIVE
Glucose, UA: NEGATIVE mg/dL
Ketones, ur: NEGATIVE mg/dL
Leukocytes,Ua: NEGATIVE
Nitrite: NEGATIVE
Protein, ur: 100 mg/dL — AB
Specific Gravity, Urine: 1.016 (ref 1.005–1.030)
pH: 6 (ref 5.0–8.0)

## 2023-12-20 LAB — GLUCOSE, CAPILLARY
Glucose-Capillary: 134 mg/dL — ABNORMAL HIGH (ref 70–99)
Glucose-Capillary: 134 mg/dL — ABNORMAL HIGH (ref 70–99)
Glucose-Capillary: 137 mg/dL — ABNORMAL HIGH (ref 70–99)
Glucose-Capillary: 143 mg/dL — ABNORMAL HIGH (ref 70–99)

## 2023-12-20 LAB — HEMOGLOBIN AND HEMATOCRIT, BLOOD
HCT: 23.8 % — ABNORMAL LOW (ref 39.0–52.0)
Hemoglobin: 7.9 g/dL — ABNORMAL LOW (ref 13.0–17.0)

## 2023-12-20 MED ORDER — DEXTROSE 5 % IV SOLN: INTRAVENOUS | Status: DC

## 2023-12-20 MED ORDER — POTASSIUM CHLORIDE 10 MEQ/100ML IV SOLN
10.0000 meq | INTRAVENOUS | Status: AC
  Administered 2023-12-20 (×4): 10 meq via INTRAVENOUS
  Filled 2023-12-20 (×3): qty 100

## 2023-12-20 MED ORDER — GLYCOPYRROLATE 0.2 MG/ML IJ SOLN: 0.2000 mg | Freq: Three times a day (TID) | INTRAMUSCULAR | Status: DC | PRN

## 2023-12-20 MED ORDER — CHLORHEXIDINE GLUCONATE CLOTH 2 % EX PADS
6.0000 | MEDICATED_PAD | Freq: Every day | CUTANEOUS | Status: DC
  Administered 2023-12-20 – 2024-01-04 (×15): 6 via TOPICAL

## 2023-12-20 MED ORDER — POTASSIUM CHLORIDE 10 MEQ/100ML IV SOLN
10.0000 meq | INTRAVENOUS | Status: AC
  Filled 2023-12-20: qty 100

## 2023-12-20 NOTE — Progress Notes (Shared)
 Provider notified of rectal temp 95.1 F. Warm blanket placed on pt and temp in room turned up to 80 degrees F and door closed.  Will continue to monitor.

## 2023-12-20 NOTE — Progress Notes (Signed)
 SLP Cancellation Note  Patient Details Name: Tadd Holtmeyer MRN: [id number] DOB: 25-May-1928   Cancelled treatment:       Reason Eval/Treat Not Completed: Fatigue/lethargy limiting ability to participate;Patient's level of consciousness. ST will continue efforts  Sissy Goetzke H. Clois KILLIAN, CCC-SLP Speech Language Pathologist    [name] 12/20/2023, 7:33 PM

## 2023-12-20 NOTE — Progress Notes (Addendum)
 PROGRESS NOTE  Terrance Miller  FMW:[id number] DOB: 1928/08/10 DOA: 12/08/2023 PCP: Cook, [name], DO   Chief Complaint  Patient presents with   Hypotension    Brief Hospital admission narrative:    89 y.o. male with medical history significant for HTN, HLD, MGUS/chronic microcytic anemia, CKD stage IIIB, diet-controlled diabetes mellitus type 2, chronic thrombocytopenia, dementia, HFpEF/chronic diastolic dysfunction CHF,, h/o CAD, PAFib, h/o BPH with history of prior hydrocelectomy admitted on 12/08/2023 from SNF facility with altered mentation/acute metabolic encephalopathy, hypothermia and hypotension with concerns for possible infection   Patient was admitted from 5/28 to 6/2 and presented with similar presentation where he had hypothermia, bradycardia and hypotension as well as C. difficile colitis.     Assessment & Plan: 1)SIRS ---SIRS pathophysiology has resolved Hypotension and Hypothermia resolved initially Off Levophed  Urine cx neg, chest x-ray without definite pneumonia ---completed at least 10 days of broad-spectrum antibiotics already-including Cefepime  and Vanco -A.m. cortisol on 12/08/2022 was 20.9 -Recent ACTH  stimulation test during prior admission was WNL -on 12/20/23 patient became hypothermic again -Family requested no limitations to treatment---patient transferred back to ICU for warming blanket, blood cultures requested, UA and TSH requested - Hold off on further antibiotics at this time  2)Persistent Hypoglycemia--due to inability of patient to take oral intake- -per daughter at least for the last couple of weeks prior to admission to the hospital while at SNF patient was unable to eat safely he was pocketing food and the daughter at times had to take the food out of his mouth while at SNF --No further hypoglycemia while on iv dextrose - -continue IV dextrose  for now--- palliative care and ethics consult requested due to concerns about futility of treatment -- Not a  candidate for feeding tube due to advanced dementia -- 3)Acute on chronic macrocytic Anemia/MGUS/chronic thrombocytopenia-- -No obvious bleeding noted -There is been concern of MDS Versus other myeloproliferative disorder -Family previously deferred bone marrow biopsy - Hgb is up to 8.0 from 6.9 after transfusion--, H&H may drop further due to I dextrose  iV fluids/hemodilution - Received 1 unit of PRBC on 12/18/2023--for total of 2 units of PRBC this admission -Platelets was down to 14 (at time of admission) - No overt bleeding - Continue avoiding heparin  products and use SCDs for DVT prophylaxis. - Most recent platelet count: > 120 K  4)Acute metabolic encephalopathy in the setting of SIRS--please see #1 above - -CT head without acute finding --Patient remains with very poor cognition -Excessive salivation improving with scopolamine  patch---May discontinue scopolamine  patch as this may be contributing to lethargy--May use Robinul  as needed excessive secretions -Mild elevation of serum ammonia and mild hypernatremia noted--not enough to explain extent of metabolic encephalopathy  5)Acute kidney injury on chronic kidney disease 3B -Continue to monitor urine output -Creatinine currently less than 4 (creatinine peaked at 4.79 this admission) - Poor baseline functional status with underlying dementia and deemed not a candidate for dialysis.  6)chronic diastolic heart failure -04/22/23 Echo--EF 50-55%, no WMA, normal RVF  -Patient unable to take oral medications safely at this time - Be judicious with IV fluid--persistent hypoglycemia and inability to safely take oral intake necessitates ongoing IV dextrose  infusion  7)Mild Hypernatremia--due to dehydration in the setting of lack of oral intake. -iv  D5 water  as ordered - Monitor BMP  8-Transaminitis - In the setting of shock of liver - Continue fluid resuscitation and follow LFTs intermittently.  9)H/o Coronary artery disease --s/p  DES x2 to RCA 2004  No ACS type symptoms  at this time -Restart aspirin  and statin when safe to give oral intake  10)history of vascular dementia--per daughter [name]--- patient's functional status declined prior to admission to the point of pocketing food and not being able to swallow and eat safely at SNF for the last couple of weeks prior to admission C/n supportive Rx -- 10)Ethics/social/failure to thrive - Goals of care and poor prognosis discussed with family members  -Further conversations with patient's daughter [name]---[phone] on 12/18/2023 --Patient remains DNR/DNI- I have discussed with patient's POA ([name]).   --- Patient is Not a candidate for PEG tube given underlying dementia - Insurance declined possible transfer to LTAC -- Family conference at bedside with patient's primary RN present, patient's biological daughter [name] present, patient's adopted [name] present,other family members including [name] and [name] present at bedside. -- Family at this time reported that they are not ready to transition to comfort care - They request transfer to stepdown for bearhugger and other more aggressive measures given persistent hypothermia. - Family reiterates that they are Not ready to de-escalate treatment protocols at this time - I notified family members that I will get palliative and ethics consult due to concerns about futility of treatment at this time.   11)Nutrition --inability to safely swallow as outlined above #10 -- Speech pathologist eval appreciated, recommends n.p.o. status as patient unable to tolerate oral intake - IV dextrose  as above  12)Hypokalemia--replace IV and recheck  DVT prophylaxis: SCDs Code Status: DNR/DNI Family Communication: Discussed with Daughter [name] , multiple failed members at bedside Disposition: ?? Hospice appropriate  Status is: Inpatient    Consultants:  Palliative  care  Procedures: See below for x-ray reports.  Antimicrobials:  Vancomycin  has been discontinued Completed 10 days of cefepime    Subjective: - -Multiple 5 members at bedside - Hypothermia noted - Still unable to take oral intake safely - Please see conversations regarding goals of care in #10 above  Objective: Vitals:   12/20/23 0700 12/20/23 0841 12/20/23 1300 12/20/23 1345  BP:  137/85 (!) 139/92 133/79  Pulse:  65 66 (!) 55  Resp:      Temp: (!) 95.1 F (35.1 C) (!) 94.6 F (34.8 C)    TempSrc: Rectal Rectal    SpO2:  100% 99% 94%  Weight:      Height:        Intake/Output Summary (Last 24 hours) at 12/20/2023 1359 Last data filed at 12/20/2023 0520 Gross per 24 hour  Intake 1536.67 ml  Output 750 ml  Net 786.67 ml   Filed Weights   12/08/23 0550 12/13/23 0400 12/16/23 0639  Weight: 82 kg 89.2 kg 96 kg    Physical Exam Gen:-Sleepy but arousable, keeps mouth open most of the time HEENT:- [place].AT, No sclera icterus Neck-Supple Neck,No JVD,.  Lungs-  CTAB , fair air movement bilaterally  CV- S1, S2 normal, RRR Abd-  +ve B.Sounds, Abd Soft, No tenderness,    Extremity/Skin:- +ve  edema,   good pedal pulses  Psych-affect is flat, nonverbal, largely sleepy and not communicative except for occasional one-word answers to questions Neuro-generalized weakness, unable to follow instructions or commands for the most part , no additional new focal deficits, no tremors  CBC: Recent Labs  Lab 12/15/23 0428 12/16/23 0844 12/17/23 0510 12/18/23 0545 12/19/23 0608 12/20/23 0440  WBC 7.4 7.7 7.2 6.2 5.9  --   HGB 6.0* 6.8* 7.0* 6.9* 8.0* 7.9*  HCT 18.2* 20.0* 21.0* 21.0*  24.6* 23.8*  MCV 102.8* 99.0 102.9* 101.9* 103.8*  --   PLT 53* 86* 101* 121* 119*  --     Basic Metabolic Panel: Recent Labs  Lab 12/15/23 0428 12/17/23 0510 12/18/23 0514 12/18/23 0545 12/19/23 0608 12/20/23 0440  NA 145 147*  --  148* 148* 146*  K 3.3* 2.9*  --  2.8* 2.8* 2.9*  CL 100  103  --  106 107 108  CO2 27 29  --  23 29 29   GLUCOSE 74 110*  --  119* 117* 146*  BUN 101* 92*  --  82* 76* 67*  CREATININE 4.56* 3.96*  --  3.66* 3.39* 2.84*  CALCIUM  8.0* 8.3*  --  8.5* 8.6* 8.6*  MG  --   --  2.3  --   --   --    GFR: Estimated Creatinine Clearance: 18.3 mL/min (A) (by C-G formula based on SCr of 2.84 mg/dL (H)).  Liver Function Tests: Recent Labs  Lab 12/15/23 0428 12/19/23 0608 12/20/23 0440  AST 68* 40 42*  ALT 78* 47* 43  ALKPHOS 155* 135* 125  BILITOT 1.7* 1.9* 1.8*  PROT 5.9* 6.2* 6.1*  ALBUMIN 2.9* 3.1* 3.1*   CBG: Recent Labs  Lab 12/19/23 1124 12/19/23 1803 12/19/23 2328 12/20/23 0535 12/20/23 1243  GLUCAP 116* 140* 156* 143* 137*   Scheduled Meds:  liver oil-zinc  oxide   Topical BID   mupirocin  ointment   Nasal BID   mouth rinse  15 mL Mouth Rinse 4 times per day   scopolamine   1 patch Transdermal Q72H   Continuous Infusions:  dextrose  75 mL/hr at 12/20/23 1358   potassium chloride      potassium chloride  10 mEq (12/20/23 1358)     LOS: 12 days   Rendall Carwin, MD [facility]  To contact the attending provider between 7A-7P or the covering provider during after hours 7P-7A, please log into the web site [email or web address] and access using universal [place] password for that web site. If you do not have the password, please call the hospital operator.  12/20/2023, 1:59 PM

## 2023-12-20 NOTE — Progress Notes (Addendum)
 Patient with low temps overnight. 9082 Dr Rendall made aware of low temps. 1130: Dr Rendall spoke with family at bedside regarding goals of care, plan of care, treatment. RN at bedside for this. Family took some time to decide. 1212: Made Dr Rendall aware that family are leaving soon and as of now all agreed to ICU level of care if needed for stabilization of temps, and treatment. 1452: Hand-off given to [facility] RN ICU Transferred to ICU bed 4 at 1505  12/20/23 0841 12/20/23 1300 12/20/23 1345  Vitals  Temp (!) 94.6 F (34.8 C)  --  (!) 95.3 F (35.2 C)  Temp Source Rectal  --  Rectal  BP 137/85 (!) 139/92 133/79  MAP (mmHg) 100 104 95  BP Location Right Arm Right Arm Right Arm  BP Method Automatic Automatic Automatic  Patient Position (if appropriate) Lying Lying Lying  Pulse Rate 65 66 (!) 55  Pulse Rate Source  --   --  Dinamap  Resp  --   --  18  Level of Consciousness  Level of Consciousness  --   --  Responds to Pain  MEWS COLOR  MEWS Score Color Yellow Yellow Yellow  Oxygen Therapy  SpO2 100 % 99 % 94 %  O2 Device Room [employer]

## 2023-12-20 NOTE — Plan of Care (Signed)
°  Problem: Education: Goal: Knowledge of General Education information will improve Description: Including pain rating scale, medication(s)/side effects and non-pharmacologic comfort measures Outcome: Progressing   Problem: Health Behavior/Discharge Planning: Goal: Ability to manage health-related needs will improve Outcome: Progressing   Problem: Clinical Measurements: Goal: Ability to maintain clinical measurements within normal limits will improve Outcome: Progressing Goal: Will remain free from infection Outcome: Progressing Goal: Diagnostic test results will improve Outcome: Progressing Goal: Respiratory complications will improve Outcome: Progressing Goal: Cardiovascular complication will be avoided Outcome: Progressing   Problem: Activity: Goal: Risk for activity intolerance will decrease Outcome: Progressing   Problem: Nutrition: Goal: Adequate nutrition will be maintained Outcome: Progressing   Problem: Coping: Goal: Level of anxiety will decrease Outcome: Progressing   Problem: Elimination: Goal: Will not experience complications related to bowel motility Outcome: Progressing Goal: Will not experience complications related to urinary retention Outcome: Progressing   Problem: Pain Managment: Goal: General experience of comfort will improve and/or be controlled Outcome: Progressing   Problem: Safety: Goal: Ability to remain free from injury will improve Outcome: Progressing   Problem: Skin Integrity: Goal: Risk for impaired skin integrity will decrease Outcome: Progressing   Problem: Fluid Volume: Goal: Hemodynamic stability will improve Outcome: Progressing   Problem: Clinical Measurements: Goal: Diagnostic test results will improve Outcome: Progressing Goal: Signs and symptoms of infection will decrease Outcome: Progressing   Problem: Respiratory: Goal: Ability to maintain adequate ventilation will improve Outcome: Progressing   Problem:  Education: Goal: Ability to demonstrate management of disease process will improve Outcome: Progressing Goal: Ability to verbalize understanding of medication therapies will improve Outcome: Progressing Goal: Individualized Educational Video(s) Outcome: Progressing   Problem: Activity: Goal: Capacity to carry out activities will improve Outcome: Progressing   Problem: Cardiac: Goal: Ability to achieve and maintain adequate cardiopulmonary perfusion will improve Outcome: Progressing

## 2023-12-20 NOTE — Progress Notes (Signed)
°  Interdisciplinary Goals of Care Family Meeting   Date carried out: 12/20/2023  Location of the meeting: Bedside  Member's involved: Physician, Bedside Registered Nurse, and Family Member or next of kin  Durable Power of Attorney or acting medical decision maker: Daughter Ms [name] Pepper--  Discussion: We discussed goals of care for Terrance Miller .    Ethics/social/failure to thrive - Goals of care and poor prognosis discussed with family members  -Further conversations with patient's daughter [name] on 12/18/2023 --Patient remains DNR/DNI- I have discussed with patient's POA (Ms. Pepper).   --- Patient is Not a candidate for PEG tube given underlying dementia - Insurance declined possible transfer to LTAC -- Family conference at bedside with patient's primary RN present, patient's biological daughter [name] Pepper present, patient's adopted [name] present, other family members including [name] and [name] present at bedside. -- Family at this time reported that they are not ready to transition to comfort care - They request transfer to stepdown for bearhugger and other more aggressive measures given persistent hypothermia. - Family reiterates that they are Not ready to de-escalate treatment protocols at this time - I notified family members that I will get palliative and ethics consult due to concerns about futility of treatment at this time.   Code status:   Code Status: Limited: Do not attempt resuscitation (DNR) -DNR-LIMITED -Do Not Intubate/DNI    Disposition: Continue current acute care  Time spent for the meeting: 52 mins   Rendall Carwin, MD  12/20/2023, 2:44 PM

## 2023-12-20 NOTE — Progress Notes (Shared)
 Pt in yellow MEWs d/t temp being 93.3 axillary, which was hard to get. Pt will not keep mouth closed and provider and charge notified. Q4H vitals now.  Pt chest felt warm to touch.  All other VSS. Will continue to monitor.

## 2023-12-20 NOTE — Plan of Care (Signed)
 Patient moved to SD for temperature management.  Vitals remained stable but patient still not interactive.

## 2023-12-21 LAB — GLUCOSE, CAPILLARY
Glucose-Capillary: 115 mg/dL — ABNORMAL HIGH (ref 70–99)
Glucose-Capillary: 133 mg/dL — ABNORMAL HIGH (ref 70–99)
Glucose-Capillary: 133 mg/dL — ABNORMAL HIGH (ref 70–99)
Glucose-Capillary: 138 mg/dL — ABNORMAL HIGH (ref 70–99)

## 2023-12-21 LAB — RENAL FUNCTION PANEL
Albumin: 3.3 g/dL — ABNORMAL LOW (ref 3.5–5.0)
Anion gap: 10 (ref 5–15)
BUN: 62 mg/dL — ABNORMAL HIGH (ref 8–23)
CO2: 29 mmol/L (ref 22–32)
Calcium: 8.7 mg/dL — ABNORMAL LOW (ref 8.9–10.3)
Chloride: 107 mmol/L (ref 98–111)
Creatinine, Ser: 2.75 mg/dL — ABNORMAL HIGH (ref 0.61–1.24)
GFR, Estimated: 21 mL/min — ABNORMAL LOW (ref >60)
Glucose, Bld: 128 mg/dL — ABNORMAL HIGH (ref 70–99)
Phosphorus: 3 mg/dL (ref 2.5–4.6)
Potassium: 3 mmol/L — ABNORMAL LOW (ref 3.5–5.1)
Sodium: 146 mmol/L — ABNORMAL HIGH (ref 135–145)

## 2023-12-21 LAB — CBC
HCT: 23.9 % — ABNORMAL LOW (ref 39.0–52.0)
Hemoglobin: 7.8 g/dL — ABNORMAL LOW (ref 13.0–17.0)
MCH: 33.5 pg (ref 26.0–34.0)
MCHC: 32.6 g/dL (ref 30.0–36.0)
MCV: 102.6 fL — ABNORMAL HIGH (ref 80.0–100.0)
Platelets: 112 K/uL — ABNORMAL LOW (ref 150–400)
RBC: 2.33 MIL/uL — ABNORMAL LOW (ref 4.22–5.81)
RDW: 22.5 % — ABNORMAL HIGH (ref 11.5–15.5)
WBC: 5.3 K/uL (ref 4.0–10.5)
nRBC: 7.6 % — ABNORMAL HIGH (ref 0.0–0.2)

## 2023-12-21 LAB — MAGNESIUM: Magnesium: 2.2 mg/dL (ref 1.7–2.4)

## 2023-12-21 LAB — TSH: TSH: 3.61 u[IU]/mL (ref 0.350–4.500)

## 2023-12-21 MED ORDER — POTASSIUM CHLORIDE 10 MEQ/100ML IV SOLN
10.0000 meq | INTRAVENOUS | Status: AC
  Administered 2023-12-21 (×4): 10 meq via INTRAVENOUS
  Filled 2023-12-21 (×4): qty 100

## 2023-12-21 MED ORDER — DEXTROSE 5 % IV SOLN: INTRAVENOUS | Status: DC

## 2023-12-21 MED ORDER — POTASSIUM CHLORIDE 10 MEQ/100ML IV SOLN
10.0000 meq | INTRAVENOUS | Status: AC
  Administered 2023-12-21 – 2023-12-22 (×4): 10 meq via INTRAVENOUS
  Filled 2023-12-21: qty 100

## 2023-12-21 NOTE — Progress Notes (Signed)
 Speech Language Pathology Treatment: Dysphagia  Patient Details Name: Terrance Miller MRN: [id number] DOB: 1928-11-16 Today's Date: 12/21/2023 Time: 8489-8465 SLP Time Calculation (min) (ACUTE ONLY): 24 min  Assessment / Plan / Recommendation Clinical Impression  Pt seen for ongoing dysphagia intervention. Family was here earlier, but not present during my visit. Pt intermittently alert and followed simple directions to open mouth, say ahh, cough, but ultimately required max multimodality cues for alertness and attention to task. Oral care provided and thick dried mucous removed from the roof of his mouth. Pt took ~6 ice chips over course of trials with prolonged oral transit, oral holding, suspected delay in swallow trigger, and occasional delayed coughing and wet vocal quality. Pt is not ready for food trials. Continue NPO with oral care and can offer ice chips for comfort.     HPI HPI: 89 y.o. male with past medical history of dementia, HTN, HLD, CKDIV, DM2 admitted on 12/08/2023 with septic shock due to UTI- required pressors but they are off now, AKI. Mental status is recovering poorly. BSE requested and recommended NPO.  ST f/u for po readiness.      SLP Plan  Continue with current plan of care        Swallow Evaluation Recommendations   Recommendations: NPO;Ice chips PRN after oral care Liquid Administration via: Spoon Medication Administration: Via alternative means Postural changes: Position pt fully upright for meals;Stay upright 30-60 min after meals Oral care recommendations: Oral care QID (4x/day);Staff/trained caregiver to provide oral care;Use suctioning for oral care;Oral care before ice chips/water  Recommended consults: Consider Palliative care Caregiver Recommendations: Have oral suction available     Recommendations                     Oral care QID;Staff/trained caregiver to provide oral care   Frequent or constant Supervision/Assistance Dysphagia,  unspecified (R13.10)     Continue with current plan of care    Thank you,  [name], CCC-SLP [phone]  Anguel Delapena  12/21/2023, 3:38 PM

## 2023-12-21 NOTE — Progress Notes (Signed)
 Daily Progress Note   Date: 12/21/2023   Patient Name: Terrance Miller  DOB: [birth year]  MRN: [id number]  Age / Sex: 89 y.o., male  Attending Physician: Pearlean Manus, MD Primary Care Physician: [name], DO Admit Date: 12/08/2023 Length of Stay: 13 days  Reason for Follow-up: Establishing goals of care  Past Medical History:  Diagnosis Date   Acute ST elevation myocardial infarction (STEMI) of inferior wall (HCC) 2004   Coronary artery disease    DES x2 to the RCA 2004, residual disease managed medically   Hyperlipidemia    Hypertension    Type 2 diabetes mellitus (HCC)     Subjective:   Subjective: Chart Reviewed. Updates received. Patient Assessed. Created space and opportunity for patient  and family to explore thoughts and feelings regarding current medical situation.  Today's Discussion: Today before meeting with the patient/family, I reviewed the chart notes including previous palliative note dated 12/15/2023, hospice note from 12/19/2023, SLP note from 12/18/2023, nursing notes from yesterday, hospitalist note from yesterday, hospitalist GOC note from yesterday, nursing note from today.. I also reviewed vital signs, nursing flowsheets, medication administrations record, labs, and imaging. Labs reviewed include renal function panel showing persistent hypernatremia though stable at 146, elevated creatinine at 2.75 though slight improvement over the past several days likely secondary to fluids in the setting of poor oral intake.  CBC with normal white count continuous at 5.3 today consistent with no ongoing infection despite overall decline.  CBG levels in the past couple days ranging from 130s to 150s in the setting of n.p.o., unable to tolerate oral intake, ongoing IV dextrose  infusion for hypoglycemia.  Today saw the patient at bedside, no family was present.  He was resting and I elected to not wake him.  I went to the hospitalist office and had an extensive discussion  about clinical progress today.  I discussed previous palliative intent to work with the patient's family and they declined our involvement.  However, we have been reconsulted because of ongoing failure for improvement.  Currently they are unable to feed the patient, not a candidate for PEG tube due to comorbidities and overall clinical status.  Because of bouts of hypoglycemia he is on IV dextrose  but unsure of how long this can continue.  Daughter [name] is Chartered Loss Adjuster with support from other family members.  I engaged in a conference call with the patient's daughter with Dr. Pearlean.  The hospitalist spent significant amount of time talking about the patient's family, they are left for him and not wanting him to suffer, which is the goal of the medical team as well.  Overall pain and a very specific picture of the patient unable to safely tolerate oral intake based on clinical experience of hospitalist and clinical experience of SLP.  Subsequently he is NPO.  He is not a candidate for PEG tube placement as decided by the medical team.  He is unfortunately in a situation where there is no good option to be able to feed him.  He has been sustained on IV dextrose  due to hypoglycemia.  We spent time talking about IV dextrose  as a temporary solution for hypoglycemia but not true nutrition and unable to sustain somebody in the long term.  The hospitalist expressed that there is a desire to help the patient, but they are simply unable to help him.  If he were to able to swallow and tolerate oral intake he would likely already be back at his facility.  However,  we are currently painted until he clinical corner.  Daughter asked about LTAC admission and Dr. Pearlean shared that they attempted evaluation for LTAC placement but insurance denied.  He explained the rationale behind insurance denial is that he is not on any routine medications other than IV dextrose  and because he is not on medications, not on  specific medical treatments, unable to rehab and so there is no role for LTAC placement.  We spent a significant amount of time talk about oral intake and limitations for that.  Daughter states that they are able to give him a couple ice chips and some sips of coffee.  However, we explained that that is not enough to be able to sustain somebody from a nutrition standpoint.  He is n.p.o. except for sips and ice chips, but much more than that and he is likely to aspirate and cause worsening.  Dr. Pearlean shared that from a moral and legal perspective we are unable to authorize a diet per hospital staff because of the risks.  However, if family is accepting of risks they are able to feed him if they choose to.  Daughter shares that they will indeed try to feed him and encourage recovery of his swallowing.  Dr. Pearlean shared that he is basing his assessment of inability to swallow based on SLP expertise, the fact that the patient is unable to speak well and has a weak voice.  Again, he shared that everyone here wants to be able to help him but we simply do not have a way to safely do that.  Daughter shared that they are seeing things differently than we are.  They have seen improvement since the patient has been here.  We did acknowledge that he is having some improvement in his mental status and awareness.  However, it still remains that, based on clinical assessment and expertise, it is not safe to authorize a diet.  Without a means to safely feed him we are simply limited in what can be done.  Daughter shared that his only hair hurdle is that he is unable to eat.  We will get over it with or without tube.  We are trying to keep him alive.  She is clear that their (family) goal is for him to live as long as possible and that they will never authorize care or treatment stopped.  She is also clear that she does not want involvement with palliative care directly.  After the conversation Dr. Pearlean and myself  debriefed.  The patient will remain n.p.o. by order but family will be allowed to try to feed the patient if they feel this would help.  Overall prognosis remains quite poor and do not anticipate recovery of swallow function due to ongoing progressive weakness in the setting of vascular dementia, poor oral intake and pocketing of food at baseline.  Continue current treatments and time for outcomes but do not anticipate significant improvement.  Palliative medicine will back off for now but remain available to the hospice team within the limits set by family.  Review of Systems  Unable to perform ROS   Objective:   Primary Diagnoses: Present on Admission:  Septic shock (HCC)   Vital Signs:  BP 137/64   Pulse (!) 56   Temp 98.6 F (37 C) (Rectal)   Resp 14   Ht 5' 8 (1.727 m)   Wt 91.2 kg   SpO2 92%   BMI 30.57 kg/m   Physical Exam Vitals and  nursing note reviewed.  Constitutional:      General: He is sleeping. He is not in acute distress. Cardiovascular:     Rate and Rhythm: Normal rate.  Pulmonary:     Effort: Pulmonary effort is normal. No respiratory distress.  Abdominal:     General: Abdomen is flat.     Palliative Assessment/Data: 10%   Existing Vynca/ACP Documentation: None  Assessment & Plan:   HPI/Patient Profile:  89 y.o. male with past medical history of dementia, HTN, HLD, CKDIV, DM2 admitted on 12/08/2023 with septic shock due to UTI- required pressors but they are off now, AKI. Mental status is recovering poorly. Palliative medicine consulted for GOC.   SUMMARY OF RECOMMENDATIONS   DNR/DNI Patient to remain n.p.o. Family accepting of risks if they choose to try to feed him Continue current scope of care Palliative medicine will follow peripherally and not engage with family as requested by family specifically  Symptom Management:  Per primary team Palliative medicine is available to assist as needed  Code Status: DNR - Limited  (DNR/DNI)  Prognosis: < 4 weeks  Discharge Planning: To Be Determined  Discussed with: Patient's family, medical team, nursing team  Thank you for allowing us  to participate in the care of Terrance Miller PMT will continue to support holistically.  Time Total: 105 min  Detailed review of medical records (labs, imaging, vital signs), medically appropriate exam, discussed with treatment team, counseling and education to patient, family, & staff, documenting clinical information, medication management, coordination of care  Camellia Kays, NP Palliative Medicine Team  Team Phone # [phone] (Nights/Weekends)  09/04/2020, 8:17 AM

## 2023-12-21 NOTE — Plan of Care (Signed)
 Patient vitals remained stable.  Family visited and discussed patient eating again.  Speech evaluated again.  Limit to occasional ice chips for comfort but still not safe to advance diet. Patient alert to verbal stimuli.

## 2023-12-21 NOTE — Progress Notes (Signed)
°  Interdisciplinary Goals of Care Family Meeting  Date carried out: 12/21/2023  Location of the meeting: Phone conference  Member's involved: Physician, Family Member or next of kin, and Palliative care team member  Durable Power of Attorney or acting medical decision maker:  Daughter Terrance Miller--HCPOA  Discussion: We discussed goals of care for Terrance Miller .   - Please see full note from palliative care provider dated 12/21/2023 - Extensive phone conference with patient's daughter/healthcare power of attorney, palliative care provider and myself - Again please see documentation from palliative care provider dated 2015 2025 for details of discussions and detailed conversations  -Treat the treatable, continue current level of care, no changes to treatment protocols at this time  Code status:   Code Status: Limited: Do not attempt resuscitation (DNR) -DNR-LIMITED -Do Not Intubate/DNI    Disposition: Continue current acute care  Time spent for the meeting: 57 mins  Terrance Carwin, MD  12/21/2023, 7:34 PM ;

## 2023-12-21 NOTE — Plan of Care (Signed)
°  Problem: Education: Goal: Knowledge of General Education information will improve Description: Including pain rating scale, medication(s)/side effects and non-pharmacologic comfort measures Outcome: Progressing   Problem: Health Behavior/Discharge Planning: Goal: Ability to manage health-related needs will improve Outcome: Progressing   Problem: Clinical Measurements: Goal: Ability to maintain clinical measurements within normal limits will improve Outcome: Progressing Goal: Will remain free from infection Outcome: Progressing Goal: Diagnostic test results will improve Outcome: Progressing Goal: Respiratory complications will improve Outcome: Progressing Goal: Cardiovascular complication will be avoided Outcome: Progressing   Problem: Activity: Goal: Risk for activity intolerance will decrease Outcome: Progressing   Problem: Nutrition: Goal: Adequate nutrition will be maintained Outcome: Progressing   Problem: Coping: Goal: Level of anxiety will decrease Outcome: Progressing   Problem: Elimination: Goal: Will not experience complications related to bowel motility Outcome: Progressing Goal: Will not experience complications related to urinary retention Outcome: Progressing   Problem: Pain Managment: Goal: General experience of comfort will improve and/or be controlled Outcome: Progressing   Problem: Safety: Goal: Ability to remain free from injury will improve Outcome: Progressing   Problem: Skin Integrity: Goal: Risk for impaired skin integrity will decrease Outcome: Progressing   Problem: Fluid Volume: Goal: Hemodynamic stability will improve Outcome: Progressing   Problem: Clinical Measurements: Goal: Diagnostic test results will improve Outcome: Progressing Goal: Signs and symptoms of infection will decrease Outcome: Progressing   Problem: Respiratory: Goal: Ability to maintain adequate ventilation will improve Outcome: Progressing   Problem:  Education: Goal: Ability to demonstrate management of disease process will improve Outcome: Progressing Goal: Ability to verbalize understanding of medication therapies will improve Outcome: Progressing Goal: Individualized Educational Video(s) Outcome: Progressing   Problem: Activity: Goal: Capacity to carry out activities will improve Outcome: Progressing   Problem: Cardiac: Goal: Ability to achieve and maintain adequate cardiopulmonary perfusion will improve Outcome: Progressing

## 2023-12-21 NOTE — Progress Notes (Signed)
 PROGRESS NOTE  Terrance Miller  FMW:[id number] DOB: Aug 05, 1928 DOA: 12/08/2023 PCP: Miller, Terrance G, DO   Chief Complaint  Patient presents with   Hypotension    Brief Hospital admission narrative:    89 y.o. male with medical history significant for HTN, HLD, MGUS/chronic microcytic anemia, CKD stage IIIB, diet-controlled diabetes mellitus type 2, chronic thrombocytopenia, dementia, HFpEF/chronic diastolic dysfunction CHF,, h/o CAD, PAFib, h/o BPH with history of prior hydrocelectomy admitted on 12/08/2023 from SNF facility with altered mentation/acute metabolic encephalopathy, hypothermia and hypotension with concerns for possible infection   Patient was admitted from 5/28 to 6/2 and presented with similar presentation where he had hypothermia, bradycardia and hypotension as well as C. difficile colitis.     Assessment & Plan: 1)SIRS ---SIRS pathophysiology has resolved Hypotension and Hypothermia resolved initially Off Levophed  Urine cx neg, chest x-ray without definite pneumonia ---completed at least 10 days of broad-spectrum antibiotics already-including Cefepime  and Vanco -A.m. cortisol on 12/08/2022 was 20.9 -Recent ACTH  stimulation test during prior admission was WNL -on 12/20/23 patient became hypothermic again -Family requested No limitations to treatment---patient transferred back to stepdown on 12/20/2023 for warming blanket, blood cultures requested, UA and TSH requested - Hold off on further antibiotics at this time - TSH  3.6 - UA not suggestive of UTI - Repeat blood cultures from 12/20/2023 NGTD  2)Persistent Hypoglycemia--due to inability of patient to take oral intake- -per daughter at least for the last couple of weeks prior to admission to the hospital while at SNF patient was unable to eat safely he was pocketing food and the daughter at times had to take the food out of his mouth while at SNF --No further hypoglycemia while on iv dextrose - -continue IV dextrose  for  now--- palliative care and ethics consult requested due to concerns about futility of treatment with IV dextrose  solution -- Not a candidate for feeding tube due to advanced dementia -- 3)Acute on chronic macrocytic Anemia/MGUS/chronic thrombocytopenia-- -No obvious bleeding noted -There is been concern of MDS Versus other myeloproliferative disorder -Family previously deferred bone marrow biopsy - Hgb is up to around 8 from 6.9 after transfusion--, H&H may drop further due to I dextrose  iV fluids/hemodilution - Received 1 unit of PRBC on 12/18/2023--for total of 2 units of PRBC this admission -Platelets was down to 14 (at time of admission) - No overt bleeding - Continue avoiding heparin  products and use SCDs for DVT prophylaxis. - Most recent platelet count: > 100 K  4)Acute metabolic encephalopathy in the setting of SIRS--please see #1 above - -CT head without acute finding --Patient remains with very poor cognition -Excessive salivation improving with scopolamine  patch---May discontinue scopolamine  patch as this may be contributing to lethargy--May use Robinul  as needed excessive secretions -Mild elevation of serum ammonia and mild hypernatremia noted--not enough to explain extent of metabolic encephalopathy  5)Acute kidney injury on chronic kidney disease 3B -Continue to monitor urine output -Creatinine currently less than 3 (creatinine peaked at 4.79 this admission) - Poor baseline functional status with underlying dementia and deemed not a candidate for dialysis.  6)chronic diastolic heart failure -04/22/23 Echo--EF 50-55%, no WMA, normal RVF  -Patient unable to take oral medications safely at this time - Be judicious with IV fluid--persistent hypoglycemia and inability to safely take oral intake necessitates ongoing IV dextrose  infusion  7)Mild Hypernatremia--due to dehydration in the setting of lack of oral intake. -iv  D5 water  as ordered - Monitor BMP  8-Transaminitis -  In the setting of shock of liver -  Continue fluid resuscitation and follow LFTs intermittently.  9)H/o Coronary artery disease --s/p DES x2 to RCA 2004  No ACS type symptoms at this time -Restart aspirin  and statin when safe to give oral intake  10)history of vascular dementia--per daughter Terrance--- patient's functional status declined prior to admission to the point of pocketing food and not being able to swallow and eat safely at SNF for the last couple of weeks prior to admission C/n supportive Rx -- 10)Ethics/social/failure to thrive - Goals of care and poor prognosis discussed with family members  -Further conversations with patient's daughter Ms Terrance Miller---[phone] on 12/18/2023 --Patient remains DNR/DNI- I have discussed with patient's POA (Ms. Miller).   --- Patient is Not a candidate for PEG tube given underlying dementia - Insurance declined possible transfer to LTAC -- Family conference at bedside with patient's primary RN present, patient's biological daughter Terrance Miller present, patient's adopted Dr. Apolinar Miller present,other family members including Terrance Miller and Terrance Miller present at bedside. -- Family at this time reported that they are not ready to transition to comfort care - They request transfer to stepdown for bearhugger and other more aggressive measures given persistent hypothermia. - Family reiterates that they are Not ready to de-escalate treatment protocols at this time - I notified family members that I will get palliative and ethics consult due to concerns about futility of treatment at this time.   11)Nutrition --inability to safely swallow as outlined above #10 -- Speech pathologist eval appreciated, recommends n.p.o. status as patient unable to tolerate oral intake - IV dextrose  as above  12)Hypokalemia--replace IV and recheck  DVT prophylaxis: SCDs Code Status: DNR/DNI Family Communication: Discussed with Daughter Ms Terrance Miller , multiple failed members at bedside Disposition: ?? Hospice appropriate  Status is: Inpatient    Consultants:  Palliative care Speech pathologist  Procedures: See below for x-ray reports.  Antimicrobials:  Vancomycin  has been discontinued Completed 10 days of cefepime    Subjective: - - Resting comfortably - Requiring warming blanket - Still unable to safely take oral intake apart from ice chips  Objective: Vitals:   12/21/23 1500 12/21/23 1600 12/21/23 1630 12/21/23 1725  BP: (!) 141/71 (!) 141/61  131/81  Pulse:      Resp: (!) 26 15  (!) 21  Temp: 98.5 F (36.9 C)  98.4 F (36.9 C)   TempSrc: Rectal  Rectal   SpO2:      Weight:      Height:        Intake/Output Summary (Last 24 hours) at 12/21/2023 1936 Last data filed at 12/21/2023 1704 Gross per 24 hour  Intake 1725.05 ml  Output 915 ml  Net 810.05 ml   Filed Weights   12/16/23 0639 12/20/23 1512 12/20/23 1515  Weight: 96 kg 91.2 kg 91.2 kg    Physical Exam Gen:-Sleepy but arousable, keeps mouth open most of the time HEENT:- [place].AT, No sclera icterus Neck-Supple Neck,No JVD,.  Lungs-  CTAB , fair air movement bilaterally  CV- S1, S2 normal, RRR Abd-  +ve B.Sounds, Abd Soft, No tenderness,    Extremity/Skin:- +ve  edema,   good pedal pulses  Psych-affect is flat, nonverbal, largely sleepy and not communicative except for occasional one-word answers to questions Neuro-generalized weakness, unable to follow instructions or commands for the most part , no additional new focal deficits, no tremors  CBC: Recent Labs  Lab 12/16/23 0844 12/17/23 0510 12/18/23 0545 12/19/23 0608 12/20/23 0440 12/21/23 0424  WBC 7.7 7.2 6.2 5.9  --  5.3  HGB 6.8* 7.0* 6.9* 8.0* 7.9* 7.8*  HCT 20.0* 21.0* 21.0* 24.6* 23.8* 23.9*  MCV 99.0 102.9* 101.9* 103.8*  --  102.6*  PLT 86* 101* 121* 119*  --  112*   Basic Metabolic Panel: Recent Labs  Lab 12/17/23 0510 12/18/23 0514 12/18/23 0545  12/19/23 0608 12/20/23 0440 12/21/23 0424  NA 147*  --  148* 148* 146* 146*  K 2.9*  --  2.8* 2.8* 2.9* 3.0*  CL 103  --  106 107 108 107  CO2 29  --  23 29 29 29   GLUCOSE 110*  --  119* 117* 146* 128*  BUN 92*  --  82* 76* 67* 62*  CREATININE 3.96*  --  3.66* 3.39* 2.84* 2.75*  CALCIUM  8.3*  --  8.5* 8.6* 8.6* 8.7*  MG  --  2.3  --   --   --  2.2  PHOS  --   --   --   --   --  3.0   GFR: Estimated Creatinine Clearance: 18.4 mL/min (A) (by C-G formula based on SCr of 2.75 mg/dL (H)).  Liver Function Tests: Recent Labs  Lab 12/15/23 0428 12/19/23 0608 12/20/23 0440 12/21/23 0424  AST 68* 40 42*  --   ALT 78* 47* 43  --   ALKPHOS 155* 135* 125  --   BILITOT 1.7* 1.9* 1.8*  --   PROT 5.9* 6.2* 6.1*  --   ALBUMIN 2.9* 3.1* 3.1* 3.3*   CBG: Recent Labs  Lab 12/20/23 1837 12/20/23 2335 12/21/23 0623 12/21/23 1251 12/21/23 1846  GLUCAP 134* 134* 133* 133* 138*   Scheduled Meds:  Chlorhexidine  Gluconate Cloth  6 each Topical Daily   liver oil-zinc  oxide   Topical BID   mupirocin  ointment   Nasal BID   mouth rinse  15 mL Mouth Rinse 4 times per day   Continuous Infusions:  dextrose  83 mL/hr at 12/21/23 1544    LOS: 13 days   Rendall Carwin, MD [facility] To contact the attending provider between 7A-7P or the covering provider during after hours 7P-7A, please log into the web site [email or web address] and access using universal [place] password for that web site. If you do not have the password, please call the hospital operator.  12/21/2023, 7:36 PM

## 2023-12-22 LAB — HEMOGLOBIN AND HEMATOCRIT, BLOOD
HCT: 25 % — ABNORMAL LOW (ref 39.0–52.0)
Hemoglobin: 8 g/dL — ABNORMAL LOW (ref 13.0–17.0)

## 2023-12-22 LAB — BASIC METABOLIC PANEL WITH GFR
Anion gap: 11 (ref 5–15)
BUN: 57 mg/dL — ABNORMAL HIGH (ref 8–23)
CO2: 26 mmol/L (ref 22–32)
Calcium: 8.7 mg/dL — ABNORMAL LOW (ref 8.9–10.3)
Chloride: 105 mmol/L (ref 98–111)
Creatinine, Ser: 2.77 mg/dL — ABNORMAL HIGH (ref 0.61–1.24)
GFR, Estimated: 21 mL/min — ABNORMAL LOW (ref >60)
Glucose, Bld: 126 mg/dL — ABNORMAL HIGH (ref 70–99)
Potassium: 3.8 mmol/L (ref 3.5–5.1)
Sodium: 142 mmol/L (ref 135–145)

## 2023-12-22 LAB — GLUCOSE, CAPILLARY
Glucose-Capillary: 114 mg/dL — ABNORMAL HIGH (ref 70–99)
Glucose-Capillary: 115 mg/dL — ABNORMAL HIGH (ref 70–99)
Glucose-Capillary: 130 mg/dL — ABNORMAL HIGH (ref 70–99)
Glucose-Capillary: 141 mg/dL — ABNORMAL HIGH (ref 70–99)

## 2023-12-22 MED ORDER — ASPIRIN 300 MG RE SUPP
300.0000 mg | RECTAL | Status: DC
  Administered 2023-12-22 – 2024-01-03 (×8): 300 mg via RECTAL
  Filled 2023-12-22 (×8): qty 1

## 2023-12-22 MED ORDER — DEXTROSE 5 % IV SOLN: INTRAVENOUS | Status: DC

## 2023-12-22 NOTE — Progress Notes (Signed)
 Speech Language Pathology Treatment: Dysphagia  Patient Details Name: Terrance Miller MRN: [id number] DOB: August 07, 1928 Today's Date: 12/22/2023 Time: 8340-8277 SLP Time Calculation (min) (ACUTE ONLY): 23 min  Assessment / Plan / Recommendation Clinical Impression  Pt seen for ongoing dysphagia intervention. Pt with noted improved alertness and attention to task today. His breathing sounds less audible and he is talking a little more. He declined PO trials, but accepted some with verbal encouragement. Pt assessed with ice chips, tsp water , orange sherbet, and a couple of bites of applesauce before he repeated, I don't want no more.. Pt with some left labial spillage with thins, oral holding, and suspected delay in swallow trigger, but no overt coughing today. Pt with wet vocal quality at times and was cued to cough (weakly). Depending on goals of care, can consider comfort feeds, however Pt does not want much to eat at this time. Continue to assess for PO readiness (this is predominantly due to lethargy), offer oral care and ice chips prn, and can consider PO medication crushed as able in puree if warranted. SLP will continue to follow.     HPI HPI: 89 y.o. male with past medical history of dementia, HTN, HLD, CKDIV, DM2 admitted on 12/08/2023 with septic shock due to UTI- required pressors but they are off now, AKI. Mental status is recovering poorly. BSE requested and recommended NPO.  ST f/u for po readiness.      SLP Plan  Continue with current plan of care        Swallow Evaluation Recommendations   Recommendations: Ice chips PRN after oral care Medication Administration: Crushed with puree (If Pt is alert) Postural changes: Position pt fully upright for meals Oral care recommendations: Oral care QID (4x/day);Oral care before ice chips/water      Recommendations                     Oral care QID;Staff/trained caregiver to provide oral care   Frequent or constant  Supervision/Assistance Dysphagia, unspecified (R13.10)     Continue with current plan of care   Thank you,  Terrance Miller, CCC-SLP [phone]   Terrance Miller  12/22/2023, 5:27 PM

## 2023-12-22 NOTE — Progress Notes (Signed)
 Physical Therapy Treatment Patient Details Name: Terrance Miller MRN: [id number] DOB: 02-20-1928 Today's Date: 12/22/2023   History of Present Illness Terrance Miller is a 89 y.o. male with medical history significant of hypertension, hyperlipidemia, MGUS, CKD stage III, diet-controlled diabetes mellitus type 2, thrombocytopenia, dementia, HFpEF, coronary disease who presents to the emergency department via EMS due to altered mental status.  She was noted with altered mental status for the SNF yesterday in the evening with heart rate in 30s and patient was hypotensive with SBP in the 70s.    PT Comments  Patient presents more alert and agreeable for therapy. Patient demonstrates slow labored movement for sitting up at bedside with most difficulty moving legs due to weakness, once seated required stretching to low back by having patient lean forward due to posterior leaning, after 2-3 minutes able to keep trunk in mid-line for attempting sit to stands. Patient able to stand using RW and limited to a few unsteady labored side steps before having to sit due to BLE weakness. Patient tolerated sitting up in chair after therapy with lift sling in place - RN aware. Patient will benefit from continued skilled physical therapy in hospital and recommended venue below to increase strength, balance, endurance for safe ADLs and gait.       If plan is discharge home, recommend the following: A lot of help with bathing/dressing/bathroom;A lot of help with walking and/or transfers;Help with stairs or ramp for entrance;Assist for transportation;Assistance with cooking/housework   Can travel by private vehicle     No  Equipment Recommendations  None recommended by PT    Recommendations for Other Services       Precautions / Restrictions Precautions Precautions: Fall Recall of Precautions/Restrictions: Impaired Restrictions Weight Bearing Restrictions Per Provider Order: No     Mobility  Bed  Mobility Overal bed mobility: Needs Assistance Bed Mobility: Supine to Sit     Supine to sit: Max assist, HOB elevated     General bed mobility comments: slow labored movement with diffiuclty moving legs due to weakness    Transfers Overall transfer level: Needs assistance Equipment used: Rolling walker (2 wheels) Transfers: Sit to/from Stand, Bed to chair/wheelchair/BSC Sit to Stand: Mod assist   Step pivot transfers: Mod assist, Max assist, +2 physical assistance       General transfer comment: slow labored movement with difficulty taking steps due to BLE weakness    Ambulation/Gait Ambulation/Gait assistance: Max assist Gait Distance (Feet): 3 Feet Assistive device: Rolling walker (2 wheels) Gait Pattern/deviations: Decreased step length - right, Decreased step length - left, Decreased stride length, Shuffle, Trunk flexed, Knees buckling Gait velocity: slow     General Gait Details: limited to a few slow labored shuffling side steps before having to sit due to buckling of knees, weakness   Stairs             Wheelchair Mobility     Tilt Bed    Modified Rankin (Stroke Patients Only)       Balance Overall balance assessment: Needs assistance Sitting-balance support: Feet supported, No upper extremity supported Sitting balance-Leahy Scale: Fair Sitting balance - Comments: fair/poor Postural control: Posterior lean Standing balance support: Reliant on assistive device for balance, During functional activity, Bilateral upper extremity supported Standing balance-Leahy Scale: Poor Standing balance comment: using RW                            Communication Communication Communication: Impaired Factors  Affecting Communication: Difficulty expressing self  Cognition Arousal: Alert Behavior During Therapy: Flat affect   PT - Cognitive impairments: History of cognitive impairments                         Following commands:  Impaired Following commands impaired: Follows one step commands inconsistently, Follows one step commands with increased time    Cueing Cueing Techniques: Verbal cues, Tactile cues, Visual cues  Exercises      General Comments        Pertinent Vitals/Pain Pain Assessment Pain Assessment: Faces Faces Pain Scale: Hurts little more Pain Location: Back pain with anterior leaning. Pain Descriptors / Indicators: Discomfort, Sore Pain Intervention(s): Limited activity within patient's tolerance, Monitored during session, Repositioned    Home Living                          Prior Function            PT Goals (current goals can now be found in the care plan section) Acute Rehab PT Goals Patient Stated Goal: return home PT Goal Formulation: With patient Time For Goal Achievement: 12/30/23 Potential to Achieve Goals: Fair Progress towards PT goals: Progressing toward goals    Frequency    Min 3X/week      PT Plan      Co-evaluation PT/OT/SLP Co-Evaluation/Treatment: Yes Reason for Co-Treatment: Complexity of the patient's impairments (multi-system involvement) PT goals addressed during session: Mobility/safety with mobility;Balance;Proper use of DME OT goals addressed during session: ADL's and self-care;Strengthening/ROM      AM-PAC PT 6 Clicks Mobility   Outcome Measure  Help needed turning from your back to your side while in a flat bed without using bedrails?: A Lot Help needed moving from lying on your back to sitting on the side of a flat bed without using bedrails?: A Lot Help needed moving to and from a bed to a chair (including a wheelchair)?: A Lot Help needed standing up from a chair using your arms (e.g., wheelchair or bedside chair)?: A Lot Help needed to walk in hospital room?: A Lot Help needed climbing 3-5 steps with a railing? : Total 6 Click Score: 11    End of Session   Activity Tolerance: Patient tolerated treatment well;Patient  limited by fatigue Patient left: in chair;with call bell/phone within reach Nurse Communication: Mobility status PT Visit Diagnosis: Unsteadiness on feet (R26.81);Other abnormalities of gait and mobility (R26.89);Muscle weakness (generalized) (M62.81)     Time: 9041-8972 PT Time Calculation (min) (ACUTE ONLY): 29 min  Charges:    $Therapeutic Activity: 23-37 mins PT General Charges $$ ACUTE PT VISIT: 1 Visit                     1:53 PM, 12/22/2023 Lynwood Music, MPT Physical Therapist with [facility] 336 [phone] office [phone] mobile phone

## 2023-12-22 NOTE — Plan of Care (Signed)
°  Problem: Education: Goal: Knowledge of General Education information will improve Description: Including pain rating scale, medication(s)/side effects and non-pharmacologic comfort measures Outcome: Progressing   Problem: Health Behavior/Discharge Planning: Goal: Ability to manage health-related needs will improve Outcome: Progressing   Problem: Clinical Measurements: Goal: Ability to maintain clinical measurements within normal limits will improve Outcome: Progressing Goal: Will remain free from infection Outcome: Progressing Goal: Diagnostic test results will improve Outcome: Progressing Goal: Respiratory complications will improve Outcome: Progressing Goal: Cardiovascular complication will be avoided Outcome: Progressing   Problem: Activity: Goal: Risk for activity intolerance will decrease Outcome: Progressing   Problem: Nutrition: Goal: Adequate nutrition will be maintained Outcome: Progressing   Problem: Coping: Goal: Level of anxiety will decrease Outcome: Progressing   Problem: Elimination: Goal: Will not experience complications related to bowel motility Outcome: Progressing Goal: Will not experience complications related to urinary retention Outcome: Progressing   Problem: Pain Managment: Goal: General experience of comfort will improve and/or be controlled Outcome: Progressing   Problem: Safety: Goal: Ability to remain free from injury will improve Outcome: Progressing   Problem: Skin Integrity: Goal: Risk for impaired skin integrity will decrease Outcome: Progressing   Problem: Fluid Volume: Goal: Hemodynamic stability will improve Outcome: Progressing   Problem: Clinical Measurements: Goal: Diagnostic test results will improve Outcome: Progressing Goal: Signs and symptoms of infection will decrease Outcome: Progressing   Problem: Respiratory: Goal: Ability to maintain adequate ventilation will improve Outcome: Progressing   Problem:  Education: Goal: Ability to demonstrate management of disease process will improve Outcome: Progressing Goal: Ability to verbalize understanding of medication therapies will improve Outcome: Progressing Goal: Individualized Educational Video(s) Outcome: Progressing   Problem: Activity: Goal: Capacity to carry out activities will improve Outcome: Progressing   Problem: Cardiac: Goal: Ability to achieve and maintain adequate cardiopulmonary perfusion will improve Outcome: Progressing

## 2023-12-22 NOTE — Progress Notes (Signed)
 Occupational Therapy Treatment Patient Details Name: Terrance Miller MRN: [id number] DOB: [birth year] Today's Date: 12/22/2023   History of present illness Terrance Miller is a 89 y.o. male with medical history significant of hypertension, hyperlipidemia, MGUS, CKD stage III, diet-controlled diabetes mellitus type 2, thrombocytopenia, dementia, HFpEF, coronary disease who presents to the emergency department via EMS due to altered mental status.  She was noted with altered mental status for the SNF yesterday in the evening with heart rate in 30s and patient was hypotensive with SBP in the 70s.   OT comments  Pt presented with increased arousal from last session. Pt still non-verbal but able to vocalized some and follow some commands. Max A needed for bed mobility followed by anterior leaning/back stretches. Pt able to sit at EOB without additional physical support for several minutes today. Mod A for sit to stand from EOB with RW. Mod to max A with +2 assist to step to chair. Pt also tolerated P/ROM for shoulder flexion bilaterally. Noted to be limited to ~75% of typical available range. Pt left in the chair with lift pad in place and RN aware. Pt will benefit from continued OT in the hospital to increase strength, balance, and endurance for safe ADL's.         If plan is discharge home, recommend the following:  Two people to help with walking and/or transfers;Two people to help with bathing/dressing/bathroom;A lot of help with bathing/dressing/bathroom;Assistance with cooking/housework;Assistance with feeding;Direct supervision/assist for medications management;Direct supervision/assist for financial management;Assist for transportation;Help with stairs or ramp for entrance   Equipment Recommendations  None recommended by OT          Precautions / Restrictions Precautions Precautions: Fall Recall of Precautions/Restrictions: Impaired Restrictions Weight Bearing Restrictions Per Provider Order:  No       Mobility Bed Mobility Overal bed mobility: Needs Assistance Bed Mobility: Supine to Sit     Supine to sit: Max assist, HOB elevated     General bed mobility comments: labored movement; poor turnk control; assist to bring B LE to EOB.    Transfers Overall transfer level: Needs assistance Equipment used: Rolling walker (2 wheels) Transfers: Sit to/from Stand, Bed to chair/wheelchair/BSC Sit to Stand: Mod assist     Step pivot transfers: Mod assist, Max assist, +2 physical assistance     General transfer comment: Mod A for sit to stand from EOB with RW x2 reps; EOB to chair with mod to max A due to pt needing more assist to sit in chair; noted rigid posture when attempting to have pt sit in the recliner.     Balance Overall balance assessment: Needs assistance Sitting-balance support: Bilateral upper extremity supported, Feet supported Sitting balance-Leahy Scale: Fair Sitting balance - Comments: Able to maintain seated balance at EOB without physical assist for several minutes.   Standing balance support: Bilateral upper extremity supported, During functional activity, Reliant on assistive device for balance Standing balance-Leahy Scale: Poor Standing balance comment: using RW                           ADL either performed or assessed with clinical judgement   ADL Overall ADL's : Needs assistance/impaired                     Lower Body Dressing: Total assistance;Bed level Lower Body Dressing Details (indicate cue type and reason): Assist to don sock while supine in bed.  Communication Communication Communication: Impaired Factors Affecting Communication: Difficulty expressing self   Cognition Arousal: Lethargic Behavior During Therapy: Flat affect Cognition: No family/caregiver present to determine baseline, Cognition impaired                               Following commands:  Impaired Following commands impaired: Follows one step commands inconsistently      Cueing   Cueing Techniques: Verbal cues, Tactile cues, Visual cues  Exercises Exercises: General Upper Extremity General Exercises - Upper Extremity Shoulder Flexion: PROM, Both, 10 reps, Supine                 Pertinent Vitals/ Pain       Pain Assessment Pain Assessment: Faces Faces Pain Scale: Hurts little more Pain Location: Back pain with anterior leaning. Pain Descriptors / Indicators: Discomfort Pain Intervention(s): Monitored during session, Repositioned, Limited activity within patient's tolerance                                                          Frequency  Min 2X/week        Progress Toward Goals  OT Goals(current goals can now be found in the care plan section)  Progress towards OT goals: Progressing toward goals  Acute Rehab OT Goals Patient Stated Goal: none stated OT Goal Formulation: Patient unable to participate in goal setting Time For Goal Achievement: 12/30/23 Potential to Achieve Goals: Fair ADL Goals Pt Will Perform Eating: with min assist;sitting Pt Will Perform Grooming: with min assist;sitting Pt Will Perform Upper Body Dressing: with min assist;sitting Pt Will Perform Lower Body Dressing: with mod assist;sitting/lateral leans Pt Will Transfer to Toilet: with min assist;with mod assist;stand pivot transfer Pt Will Perform Toileting - Clothing Manipulation and hygiene: with mod assist;with max assist;bed level Pt/caregiver will Perform Home Exercise Program: Increased strength;Increased ROM;Both right and left upper extremity;With minimal assist  Plan      Co-evaluation    PT/OT/SLP Co-Evaluation/Treatment: Yes Reason for Co-Treatment: Complexity of the patient's impairments (multi-system involvement)   OT goals addressed during session: ADL's and self-care;Strengthening/ROM                         End of  Session Equipment Utilized During Treatment: Rolling walker (2 wheels)  OT Visit Diagnosis: Unsteadiness on feet (R26.81);Other abnormalities of gait and mobility (R26.89);Muscle weakness (generalized) (M62.81);Cognitive communication deficit (R41.841)   Activity Tolerance Patient limited by lethargy   Patient Left in chair;with call bell/phone within reach;with nursing/sitter in room   Nurse Communication Mobility status        Time: 1001-1027 OT Time Calculation (min): 26 min  Charges: OT General Charges $OT Visit: 1 Visit OT Treatments $Therapeutic Activity: 8-22 mins (1 unit taken due to co-treat with PT.)  JAYSON PERSON OT, MOT  Jayson Person 12/22/2023, 12:22 PM

## 2023-12-22 NOTE — TOC Progression Note (Signed)
 Transition of Care [facility]) - Progression Note    Patient Details  Name: Terrance Miller MRN: [id number] Date of Birth: 1928-05-01  Transition of Care [facility]) CM/SW Contact  Mcarthur Saddie Kim, [place] Phone Number: 12/22/2023, 10:28 AM  Clinical Narrative: Insurance denied LTAC. Per MD, daughter is aware of denial. MD aware of option to appeal.       Expected Discharge Plan: Long Term Nursing Home Barriers to Discharge: Continued Medical Work up               Expected Discharge Plan and Services                                               Social Drivers of Health (SDOH) Interventions SDOH Screenings   Food Insecurity: No Food Insecurity (12/08/2023)  Housing: Low Risk (12/08/2023)  Transportation Needs: No Transportation Needs (12/08/2023)  Utilities: Not At Risk (12/08/2023)  Alcohol Screen: Low Risk (01/05/2023)  Depression (PHQ2-9): Medium Risk (03/17/2023)  Financial Resource Strain: Low Risk (01/05/2023)  Physical Activity: Inactive (01/05/2023)  Social Connections: Unknown (06/03/2023)  Recent Concern: Social Connections - Socially Isolated (05/04/2023)  Stress: No Stress Concern Present (01/05/2023)  Tobacco Use: Medium Risk (12/08/2023)  Health Literacy: Adequate Health Literacy (01/05/2023)    Readmission Risk Interventions    12/08/2023    4:11 PM 06/04/2023    3:46 PM 04/24/2023    1:31 PM  Readmission Risk Prevention Plan  Transportation Screening Complete Complete Complete  HRI or Home Care Consult   Complete  Social Work Consult for Recovery Care Planning/Counseling   Complete  Palliative Care Screening   Complete  Medication Review Oceanographer) Complete Complete Complete  PCP or Specialist appointment within 3-5 days of discharge Not Complete    HRI or Home Care Consult Complete Complete   SW Recovery Care/Counseling Consult Complete Complete   Palliative Care Screening Not Complete Not Applicable   Skilled Nursing Facility Complete  Complete

## 2023-12-22 NOTE — Progress Notes (Addendum)
 PROGRESS NOTE  Terrance Miller  FMW:[id number] DOB: 02/26/1928 DOA: 12/08/2023 PCP: [name], [name], DO   Chief Complaint  Patient presents with   Hypotension    Brief Hospital admission narrative:    89 y.o. male with medical history significant for HTN, HLD, MGUS/chronic microcytic anemia, CKD stage IIIB, diet-controlled diabetes mellitus type 2, chronic thrombocytopenia, dementia, HFpEF/chronic diastolic dysfunction CHF,, h/o CAD, PAFib, h/o BPH with history of prior hydrocelectomy admitted on 12/08/2023 from SNF facility with altered mentation/acute metabolic encephalopathy, hypothermia and hypotension with concerns for possible infection   Patient was admitted from 5/28 to 6/2 and presented with similar presentation where he had hypothermia, bradycardia and hypotension as well as C. difficile colitis.     Assessment & Plan: 1)SIRS ---SIRS pathophysiology has resolved Hypotension and Hypothermia resolved initially Off Levophed  Urine cx neg, chest x-ray without definite pneumonia ---completed at least 10 days of broad-spectrum antibiotics already-including Cefepime  and Vanco -Recurrent episodes of hypothermia---currently off warming blanket -A.m. cortisol on 12/08/2022 was 20.9 -Recent ACTH  stimulation test during prior admission was WNL ----- TSH  3.6 - UA not suggestive of UTI - Repeat blood cultures from 12/20/2023 NGTD - patient is more awake and more alert since discontinuing scopolamine  patch  2)Persistent Hypoglycemia--due to inability of patient to take oral intake- -per daughter at least for the last couple of weeks prior to admission to the hospital while at SNF patient was unable to eat safely he was pocketing food and the daughter at times had to take the food out of his mouth while at SNF --No further hypoglycemia while on iv dextrose - -continue IV dextrose  for now--- palliative care consult from 12/21/23 appreciated -Consider Ethics consult due to concerns about futility  of prolonged treatment with IV dextrose  solution -- Not a candidate for feeding tube due to advanced dementia --Reevaluated by speech pathologist on 12/21/2023 with recommendations for n.p.o. except for ice chips -- 3)Acute on chronic macrocytic Anemia/MGUS/chronic thrombocytopenia-- -No obvious bleeding noted -There is been concern of MDS Versus other myeloproliferative disorder -Family previously deferred bone marrow biopsy - Hgb is up to around 8 from 6.9 after transfusion--, H&H may drop further due to I dextrose  iV fluids/hemodilution - Received 1 unit of PRBC on 12/18/2023--for total of 2 units of PRBC this admission -Platelets was down to 14 (at time of admission) - No overt bleeding - Continue avoiding heparin  products and use SCDs for DVT prophylaxis. - Most recent platelet count: > 100 K  4)Acute metabolic encephalopathy in the setting of SIRS--please see #1 above - -CT head without acute finding --Patient remains with very poor cognition -Excessive salivation improving with scopolamine  patch---May discontinue scopolamine  patch as this may be contributing to lethargy--May use Robinul  as needed excessive secretions -Mild elevation of serum ammonia and mild hypernatremia noted--not enough to explain extent of metabolic encephalopathy -patient is more awake and more alert since discontinuing scopolamine  patch  5)Acute kidney injury on chronic kidney disease 3B -Continue to monitor urine output -Creatinine currently less than 3 (creatinine peaked at 4.79 this admission) - Poor baseline functional status with underlying dementia and deemed not a candidate for dialysis.  6)Chronic diastolic heart failure -04/22/23 Echo--EF 50-55%, no WMA, normal RVF  -Patient unable to take oral medications safely at this time - Be judicious with IV fluid--persistent hypoglycemia and inability to safely take oral intake necessitates ongoing IV dextrose  infusion  7)Mild Hypernatremia--due to  dehydration in the setting of lack of oral intake. -iv  D5 water  as ordered - Hypernatremia  resolved with dextrose  water   8-Transaminitis - In the setting of shock of liver - Continue fluid resuscitation and follow LFTs intermittently.  9)H/o Coronary artery disease --s/p DES x2 to RCA 2004  No ACS type symptoms at this time -Restart statin when safe to give oral intake - May give aspirin  rectally for now until safe to give orally  10)history of vascular dementia--per daughter [name]--- patient's functional status declined prior to admission to the point of pocketing food and not being able to swallow and eat safely at SNF for the last couple of weeks prior to admission C/n supportive Rx -- 10)Ethics/social/failure to thrive - Goals of care and poor prognosis discussed with family members  -Further conversations with patient's daughter [name]---[phone] on 12/18/2023 --Patient remains DNR/DNI- I have discussed with patient's POA ([name]).   --- Patient is Not a candidate for PEG tube given underlying dementia - Insurance declined possible transfer to LTAC -- Family conference at bedside on 12/20/23 with patient's primary RN present, patient's biological daughter [name] present, patient's adopted [name] present,other family members including [name] and [name] present at bedside. -- Family at this time reported that they are not ready to transition to comfort care - - Family reiterates that they are Not ready to de-escalate treatment protocols at this time - Please see palliative care provider note dated 12/21/2023  11)Nutrition --inability to safely swallow as outlined above #10 -- Speech pathologist re-eval on 12/21/2023 appreciated, recommends n.p.o. status as patient unable to tolerate oral intake - IV dextrose  as above  12)Hypokalemia-- resolved with replacement  DVT prophylaxis: SCDs Code Status: DNR/DNI Family Communication: Discussed  with Daughter [name] , multiple failed members at bedside Disposition: ?? Hospice appropriate  Status is: Inpatient    Consultants:  Palliative care Speech pathologist  Procedures: See below for x-ray reports.  Antimicrobials:  Vancomycin  has been discontinued Completed 10 days of cefepime    Subjective: - - patient is more awake and more alert since discontinuing scopolamine  patch -- Off warming blanket as of 12/22/2022 - I called and updated patient's daughter [name] questions answered---  Objective: Vitals:   12/22/23 0500 12/22/23 0600 12/22/23 0628 12/22/23 0744  BP: (!) 141/83 (!) 147/79    Pulse:      Resp: 18 (!) 22    Temp:    (!) 97.3 F (36.3 C)  TempSrc:    Axillary  SpO2:      Weight:   98.6 kg   Height:        Intake/Output Summary (Last 24 hours) at 12/22/2023 1138 Last data filed at 12/22/2023 0659 Gross per 24 hour  Intake 2370.18 ml  Output 925 ml  Net 1445.18 ml   Filed Weights   12/20/23 1512 12/20/23 1515 12/22/23 0628  Weight: 91.2 kg 91.2 kg 98.6 kg    Physical Exam Gen:-Less Sleepy, easily arousable,   HEENT:- [place].AT, No sclera icterus Neck-Supple Neck,No JVD,.  Lungs-  CTAB , fair air movement bilaterally  CV- S1, S2 normal, RRR Abd-  +ve B.Sounds, Abd Soft, No tenderness,    Extremity/Skin:- +ve  edema (right upper extremity is more swollen),   good pedal pulses  Psych-affect is flat, able to answer simple questions with one-word answers,  cooperative Neuro-Generalized Weakness,  no additional new focal deficits, no tremors  CBC: Recent Labs  Lab 12/16/23 0844 12/17/23 0510 12/18/23 0545 12/19/23 0608 12/20/23 0440 12/21/23 0424 12/22/23 0424  WBC 7.7 7.2 6.2 5.9  --  5.3  --   HGB 6.8* 7.0* 6.9* 8.0* 7.9* 7.8* 8.0*  HCT 20.0* 21.0* 21.0* 24.6* 23.8* 23.9* 25.0*  MCV 99.0 102.9* 101.9* 103.8*  --  102.6*  --   PLT 86* 101* 121* 119*  --  112*  --    Basic Metabolic Panel: Recent Labs  Lab  12/18/23 0514 12/18/23 0545 12/19/23 0608 12/20/23 0440 12/21/23 0424 12/22/23 0424  NA  --  148* 148* 146* 146* 142  K  --  2.8* 2.8* 2.9* 3.0* 3.8  CL  --  106 107 108 107 105  CO2  --  23 29 29 29 26   GLUCOSE  --  119* 117* 146* 128* 126*  BUN  --  82* 76* 67* 62* 57*  CREATININE  --  3.66* 3.39* 2.84* 2.75* 2.77*  CALCIUM   --  8.5* 8.6* 8.6* 8.7* 8.7*  MG 2.3  --   --   --  2.2  --   PHOS  --   --   --   --  3.0  --    GFR: Estimated Creatinine Clearance: 19 mL/min (A) (by C-G formula based on SCr of 2.77 mg/dL (H)).  Liver Function Tests: Recent Labs  Lab 12/19/23 0608 12/20/23 0440 12/21/23 0424  AST 40 42*  --   ALT 47* 43  --   ALKPHOS 135* 125  --   BILITOT 1.9* 1.8*  --   PROT 6.2* 6.1*  --   ALBUMIN 3.1* 3.1* 3.3*   CBG: Recent Labs  Lab 12/21/23 1251 12/21/23 1846 12/21/23 2340 12/22/23 0538 12/22/23 1125  GLUCAP 133* 138* 115* 114* 141*   Scheduled Meds:  Chlorhexidine  Gluconate Cloth  6 each Topical Daily   liver oil-zinc  oxide   Topical BID   mupirocin  ointment   Nasal BID   mouth rinse  15 mL Mouth Rinse 4 times per day   Continuous Infusions:  dextrose  83 mL/hr at 12/22/23 1030    LOS: 14 days   Rendall Carwin, MD [facility] To contact the attending provider between 7A-7P or the covering provider during after hours 7P-7A, please log into the web site [email or web address] and access using universal [place] password for that web site. If you do not have the password, please call the hospital operator.  12/22/2023, 11:38 AM

## 2023-12-23 DIAGNOSIS — A419 Sepsis, unspecified organism: Secondary | ICD-10-CM | POA: Diagnosis not present

## 2023-12-23 DIAGNOSIS — R6521 Severe sepsis with septic shock: Secondary | ICD-10-CM | POA: Diagnosis not present

## 2023-12-23 LAB — COMPREHENSIVE METABOLIC PANEL WITH GFR
ALT: 40 U/L (ref 0–44)
AST: 45 U/L — ABNORMAL HIGH (ref 15–41)
Albumin: 3.2 g/dL — ABNORMAL LOW (ref 3.5–5.0)
Alkaline Phosphatase: 140 U/L — ABNORMAL HIGH (ref 38–126)
Anion gap: 11 (ref 5–15)
BUN: 58 mg/dL — ABNORMAL HIGH (ref 8–23)
CO2: 27 mmol/L (ref 22–32)
Calcium: 8.9 mg/dL (ref 8.9–10.3)
Chloride: 103 mmol/L (ref 98–111)
Creatinine, Ser: 2.66 mg/dL — ABNORMAL HIGH (ref 0.61–1.24)
GFR, Estimated: 22 mL/min — ABNORMAL LOW (ref >60)
Glucose, Bld: 122 mg/dL — ABNORMAL HIGH (ref 70–99)
Potassium: 3.5 mmol/L (ref 3.5–5.1)
Sodium: 141 mmol/L (ref 135–145)
Total Bilirubin: 2.9 mg/dL — ABNORMAL HIGH (ref 0.0–1.2)
Total Protein: 6.6 g/dL (ref 6.5–8.1)

## 2023-12-23 LAB — GLUCOSE, CAPILLARY
Glucose-Capillary: 101 mg/dL — ABNORMAL HIGH (ref 70–99)
Glucose-Capillary: 99 mg/dL (ref 70–99)

## 2023-12-23 MED ORDER — DEXTROSE 5 % IV SOLN: INTRAVENOUS | Status: DC

## 2023-12-23 NOTE — Plan of Care (Signed)
°  Problem: Education: Goal: Knowledge of General Education information will improve Description: Including pain rating scale, medication(s)/side effects and non-pharmacologic comfort measures Outcome: Not Progressing   Problem: Health Behavior/Discharge Planning: Goal: Ability to manage health-related needs will improve Outcome: Not Progressing   Problem: Clinical Measurements: Goal: Ability to maintain clinical measurements within normal limits will improve Outcome: Not Progressing Goal: Will remain free from infection Outcome: Not Progressing Goal: Diagnostic test results will improve Outcome: Not Progressing Goal: Respiratory complications will improve Outcome: Progressing Goal: Cardiovascular complication will be avoided Outcome: Progressing   Problem: Activity: Goal: Risk for activity intolerance will decrease Outcome: Not Progressing   Problem: Nutrition: Goal: Adequate nutrition will be maintained Outcome: Not Progressing   Problem: Coping: Goal: Level of anxiety will decrease Outcome: Progressing   Problem: Elimination: Goal: Will not experience complications related to bowel motility Outcome: Progressing Goal: Will not experience complications related to urinary retention Outcome: Progressing   Problem: Pain Managment: Goal: General experience of comfort will improve and/or be controlled Outcome: Progressing   Problem: Safety: Goal: Ability to remain free from injury will improve Outcome: Progressing   Problem: Skin Integrity: Goal: Risk for impaired skin integrity will decrease Outcome: Not Progressing   Problem: Fluid Volume: Goal: Hemodynamic stability will improve Outcome: Progressing   Problem: Clinical Measurements: Goal: Diagnostic test results will improve Outcome: Progressing Goal: Signs and symptoms of infection will decrease Outcome: Not Progressing   Problem: Respiratory: Goal: Ability to maintain adequate ventilation will  improve Outcome: Progressing   Problem: Education: Goal: Ability to demonstrate management of disease process will improve Outcome: Not Progressing Goal: Ability to verbalize understanding of medication therapies will improve Outcome: Not Progressing Goal: Individualized Educational Video(s) Outcome: Not Progressing   Problem: Activity: Goal: Capacity to carry out activities will improve Outcome: Not Progressing   Problem: Cardiac: Goal: Ability to achieve and maintain adequate cardiopulmonary perfusion will improve Outcome: Progressing

## 2023-12-23 NOTE — Progress Notes (Signed)
 PROGRESS NOTE  Terrance Miller  FMW:[id number] DOB: Dec 20, 1928 DOA: 12/08/2023 PCP: Cook, [name], DO   Chief Complaint  Patient presents with   Hypotension    Brief Hospital admission narrative:    89 y.o. male with medical history significant for HTN, HLD, MGUS/chronic microcytic anemia, CKD stage IIIB, diet-controlled diabetes mellitus type 2, chronic thrombocytopenia, dementia, HFpEF/chronic diastolic dysfunction CHF,, h/o CAD, PAFib, h/o BPH with history of prior hydrocelectomy admitted on 12/08/2023 from SNF facility with altered mentation/acute metabolic encephalopathy, hypothermia and hypotension with concerns for possible infection   Patient was admitted from 5/28 to 6/2 and presented with similar presentation where he had hypothermia, bradycardia and hypotension as well as C. difficile colitis.     Assessment & Plan: 1)SIRS ---SIRS pathophysiology has resolved Hypotension and Hypothermia resolved initially Off Levophed  Urine cx neg, chest x-ray without definite pneumonia ---completed at least 10 days of broad-spectrum antibiotics already-including Cefepime  and Vanco -Recurrent episodes of hypothermia---currently off warming blanket -A.m. cortisol on 12/08/2022 was 20.9 -Recent ACTH  stimulation test during prior admission was WNL ----- TSH  3.6 - UA not suggestive of UTI - Repeat blood cultures from 12/20/2023 NGTD - patient is more awake and more alert since discontinuing scopolamine  patch  2)Persistent Hypoglycemia--due to inability of patient to take oral intake- -per daughter at least for the last couple of weeks prior to admission to the hospital while at SNF patient was unable to eat safely he was pocketing food and the daughter at times had to take the food out of his mouth while at SNF --No further hypoglycemia while on iv dextrose - -continue IV dextrose  for now--- palliative care consult from 12/21/23 appreciated -Consider Ethics consult due to concerns about futility  of prolonged treatment with IV dextrose  solution -- Not a candidate for feeding tube due to advanced dementia -still unable to have oral intake due to swallowing difficulties and cognitive and mentation related challenges --Reevaluated by speech pathologist with recommendations for n.p.o. except for ice chips -- 3)Acute on chronic macrocytic Anemia/MGUS/chronic thrombocytopenia-- -No obvious bleeding noted -There is been concern of MDS Versus other myeloproliferative disorder -Family previously deferred bone marrow biopsy - Hgb is up to around 8 from 6.9 after transfusion--, H&H may drop further due to I dextrose  iV fluids/hemodilution - Received 1 unit of PRBC on 12/18/2023--for total of 2 units of PRBC this admission -Platelets was down to 14 (at time of admission) - No overt bleeding - Continue avoiding heparin  products and use SCDs for DVT prophylaxis. - Most recent platelet count: > 100 K  4)Acute metabolic encephalopathy in the setting of SIRS--please see #1 above - -CT head without acute finding --Patient remains with very poor cognition -Excessive salivation improving with scopolamine  patch---May discontinue scopolamine  patch as this may be contributing to lethargy--May use Robinul  as needed excessive secretions -Mild elevation of serum ammonia and mild hypernatremia noted--not enough to explain extent of metabolic encephalopathy -patient is more awake and more alert since discontinuing scopolamine  patch  5)Acute kidney injury on chronic kidney disease 3B -Continue to monitor urine output -Creatinine currently less than 3 (creatinine peaked at 4.79 this admission) - Poor baseline functional status with underlying dementia and deemed not a candidate for dialysis.  6)Chronic diastolic heart failure -04/22/23 Echo--EF 50-55%, no WMA, normal RVF  -Patient unable to take oral medications safely at this time - Be judicious with IV fluid--persistent hypoglycemia and inability to safely  take oral intake necessitates ongoing IV dextrose  infusion  7)Mild Hypernatremia--due to dehydration in the  setting of lack of oral intake. -iv  D5 water  as ordered - Hypernatremia resolved with dextrose  water   8-Transaminitis - In the setting of shock of liver - Continue fluid resuscitation and follow LFTs intermittently.  9)H/o Coronary artery disease --s/p DES x2 to RCA 2004  No ACS type symptoms at this time -Restart statin when safe to give oral intake - May give aspirin  rectally for now until safe to give orally  10)history of vascular dementia--per daughter Terrance--- patient's functional status declined prior to admission to the point of pocketing food and not being able to swallow and eat safely at SNF for the last couple of weeks prior to admission C/n supportive Rx -- 10)Ethics/social/failure to thrive - Goals of care and poor prognosis discussed with family members  -Further conversations with patient's daughter Ms Terrance Miller---[phone] on 12/18/2023 --Patient remains DNR/DNI- I have discussed with patient's POA (Ms. Miller).   --- Patient is Not a candidate for PEG tube given underlying dementia - Insurance declined possible transfer to LTAC -- Family conference at bedside on 12/20/23 with patient's primary RN present, patient's biological daughter Terrance Miller present, patient's adopted Dr. Apolinar Miller present,other family members including Terrance Miller and Terrance Miller present at bedside. -- Family at this time reported that they are not ready to transition to comfort care - - Family reiterates that they are Not ready to de-escalate treatment protocols at this time - Please see palliative care provider note dated 12/21/2023  11)Nutrition --inability to safely swallow as outlined above #10 -- Speech pathologist evaluated patient multiple times ---recommends n.p.o. status as patient unable to tolerate oral intake -still unable to have oral intake due to swallowing  difficulties and cognitive and mentation related challenges - IV dextrose  as above  12)Hypokalemia-- resolved with replacement  DVT prophylaxis: SCDs Code Status: DNR/DNI Family Communication: Discussed with Daughter Ms Terrance Fried , multiple failed members at bedside Disposition: ?? Hospice appropriate  Status is: Inpatient    Consultants:  Palliative care Speech pathologist  Procedures: See below for x-ray reports.  Antimicrobials:  Vancomycin  has been discontinued Completed 10 days of cefepime    Subjective: - - patient is more awake and more alert since discontinuing scopolamine  patch --Patient's daughter Terrance Miller at bedside, questions answered- -still unable to have oral intake due to swallowing difficulties and cognitive and mentation related challenges  Objective: Vitals:   12/22/23 1821 12/22/23 2025 12/23/23 0447 12/23/23 1256  BP: 130/84 (!) 124/51 (!) 137/100 (!) 141/68  Pulse: 64 (!) 51 61 (!) 54  Resp: (!) 22 19 18 20   Temp: (!) 97.4 F (36.3 C) 97.8 F (36.6 C) (!) 97.3 F (36.3 C) (!) 97.4 F (36.3 C)  TempSrc:    Oral  SpO2: 95% 100% 95% 98%  Weight:      Height:        Intake/Output Summary (Last 24 hours) at 12/23/2023 1928 Last data filed at 12/23/2023 1700 Gross per 24 hour  Intake 1478.96 ml  Output 150 ml  Net 1328.96 ml   Filed Weights   12/20/23 1512 12/20/23 1515 12/22/23 0628  Weight: 91.2 kg 91.2 kg 98.6 kg    Physical Exam Gen:-More awake, no acute distress HEENT:- [place].AT, No sclera icterus Neck-Supple Neck,No JVD,.  Lungs-  CTAB , fair air movement bilaterally  CV- S1, S2 normal, RRR Abd-  +ve B.Sounds, Abd Soft, No tenderness,    Extremity/Skin:- +ve  edema (right upper extremity is more swollen),   good pedal pulses  Psych-affect is flat,  able to answer simple questions with one-word answers,  cooperative Neuro-Generalized Weakness,  no additional new focal deficits, no tremors  CBC: Recent Labs   Lab 12/17/23 0510 12/18/23 0545 12/19/23 0608 12/20/23 0440 12/21/23 0424 12/22/23 0424  WBC 7.2 6.2 5.9  --  5.3  --   HGB 7.0* 6.9* 8.0* 7.9* 7.8* 8.0*  HCT 21.0* 21.0* 24.6* 23.8* 23.9* 25.0*  MCV 102.9* 101.9* 103.8*  --  102.6*  --   PLT 101* 121* 119*  --  112*  --    Basic Metabolic Panel: Recent Labs  Lab 12/18/23 0514 12/18/23 0545 12/19/23 0608 12/20/23 0440 12/21/23 0424 12/22/23 0424 12/23/23 0422  NA  --    < > 148* 146* 146* 142 141  K  --    < > 2.8* 2.9* 3.0* 3.8 3.5  CL  --    < > 107 108 107 105 103  CO2  --    < > 29 29 29 26 27   GLUCOSE  --    < > 117* 146* 128* 126* 122*  BUN  --    < > 76* 67* 62* 57* 58*  CREATININE  --    < > 3.39* 2.84* 2.75* 2.77* 2.66*  CALCIUM   --    < > 8.6* 8.6* 8.7* 8.7* 8.9  MG 2.3  --   --   --  2.2  --   --   PHOS  --   --   --   --  3.0  --   --    < > = values in this interval not displayed.   GFR: Estimated Creatinine Clearance: 19.8 mL/min (A) (by C-Miller formula based on SCr of 2.66 mg/dL (H)).  Liver Function Tests: Recent Labs  Lab 12/19/23 0608 12/20/23 0440 12/21/23 0424 12/23/23 0422  AST 40 42*  --  45*  ALT 47* 43  --  40  ALKPHOS 135* 125  --  140*  BILITOT 1.9* 1.8*  --  2.9*  PROT 6.2* 6.1*  --  6.6  ALBUMIN 3.1* 3.1* 3.3* 3.2*   CBG: Recent Labs  Lab 12/22/23 1125 12/22/23 1820 12/22/23 2357 12/23/23 1115 12/23/23 1807  GLUCAP 141* 115* 130* 101* 99   Scheduled Meds:  aspirin   300 mg Rectal Q T,Th,S,Su   Chlorhexidine  Gluconate Cloth  6 each Topical Daily   liver oil-zinc  oxide   Topical BID   mupirocin  ointment   Nasal BID   mouth rinse  15 mL Mouth Rinse 4 times per day   Continuous Infusions:  dextrose       LOS: 15 days   Rendall Carwin, MD [facility] To contact the attending provider between 7A-7P or the covering provider during after hours 7P-7A, please log into the web site [email or web address] and access using universal [place] password for that web site. If you do  not have the password, please call the hospital operator.  12/23/2023, 7:28 PM

## 2023-12-24 DIAGNOSIS — A419 Sepsis, unspecified organism: Secondary | ICD-10-CM | POA: Diagnosis not present

## 2023-12-24 DIAGNOSIS — Z515 Encounter for palliative care: Secondary | ICD-10-CM

## 2023-12-24 DIAGNOSIS — R6521 Severe sepsis with septic shock: Secondary | ICD-10-CM | POA: Diagnosis not present

## 2023-12-24 LAB — GLUCOSE, CAPILLARY
Glucose-Capillary: 115 mg/dL — ABNORMAL HIGH (ref 70–99)
Glucose-Capillary: 118 mg/dL — ABNORMAL HIGH (ref 70–99)

## 2023-12-24 MED ORDER — DEXTROSE 5 % IV SOLN: INTRAVENOUS | Status: AC

## 2023-12-24 NOTE — Progress Notes (Signed)
 Speech Language Pathology Treatment: Dysphagia  Patient Details Name: Terrance Miller MRN: [id number] DOB: 1928-01-17 Today's Date: 12/24/2023 Time: 8597-8574 SLP Time Calculation (min) (ACUTE ONLY): 23 min  Assessment / Plan / Recommendation Clinical Impression  Pt seen for ongoing dysphagia therapy. Unfortunately, Pt is less alert again today. He opened eyes briefly, allowed oral care and verbalized some. He accepted ice chips, small sips of water , and a couple bites of sherbet before stating, I don't want no more!. Pt exhibited labial spillage, oral holding, suspected delay in swallow trigger, and delayed coughing. Pt's primary barriers to safe and efficient po intake include: reduced desire for PO intake and reduced alertness. Recommend continue NPO with oral care and ice chips prn for comfort. Could consider comfort feeds if family chooses that path. SLP will continue to follow intermittently.   HPI HPI: 89 y.o. male with past medical history of dementia, HTN, HLD, CKDIV, DM2 admitted on 12/08/2023 with septic shock due to UTI- required pressors but they are off now, AKI. Mental status is recovering poorly. BSE requested and recommended NPO.  ST f/u for po readiness.      SLP Plan  Continue with current plan of care        Swallow Evaluation Recommendations   Recommendations: NPO except meds;Ice chips PRN after oral care Medication Administration: Via alternative means Oral care recommendations: Oral care QID (4x/day);Staff/trained caregiver to provide oral care Caregiver Recommendations: Have oral suction available     Recommendations                     Oral care QID;Staff/trained caregiver to provide oral care   Frequent or constant Supervision/Assistance Dysphagia, unspecified (R13.10)     Continue with current plan of care    Thank you,  Terrance Miller, CCC-SLP [phone]  Terrance Miller  12/24/2023, 2:29 PM

## 2023-12-24 NOTE — Progress Notes (Signed)
 PROGRESS NOTE  Terrance Miller  FMW:[id number] DOB: [birth year] DOA: 12/08/2023 PCP: Cook, [name], DO   Chief Complaint  Patient presents with   Hypotension    Brief Hospital admission narrative:    89 y.o. male with medical history significant for HTN, HLD, MGUS/chronic microcytic anemia, CKD stage IIIB, diet-controlled diabetes mellitus type 2, chronic thrombocytopenia, dementia, HFpEF/chronic diastolic dysfunction CHF,, h/o CAD, PAFib, h/o BPH with history of prior hydrocelectomy admitted on 12/08/2023 from SNF facility with altered mentation/acute metabolic encephalopathy, hypothermia and hypotension with concerns for possible infection   Patient was admitted from 5/28 to 6/2 and presented with similar presentation where he had hypothermia, bradycardia and hypotension as well as C. difficile colitis.     Assessment & Plan: 1)SIRS ---SIRS pathophysiology has resolved Hypotension and Hypothermia resolved initially Off Levophed  Urine cx neg, chest x-ray without definite pneumonia ---completed at least 10 days of broad-spectrum antibiotics already-including Cefepime  and Vanco -Recurrent episodes of hypothermia---currently off warming blanket -A.m. cortisol on 12/08/2022 was 20.9 -Recent ACTH  stimulation test during prior admission was WNL ----- TSH  3.6 - UA not suggestive of UTI - Repeat blood cultures from 12/20/2023 NGTD - patient is more awake and more alert since discontinuing scopolamine  patch  2)Persistent Hypoglycemia--due to inability of patient to take oral intake- -per daughter at least for the last couple of weeks prior to admission to the hospital while at SNF patient was unable to eat safely he was pocketing food and the daughter at times had to take the food out of his mouth while at SNF --No further hypoglycemia while on iv dextrose - -continue IV dextrose  for now--- palliative care consult from 12/21/23 appreciated -Consider Ethics consult due to concerns about futility  of prolonged treatment with IV dextrose  solution -- Not a candidate for feeding tube due to advanced dementia -still unable to have oral intake due to swallowing difficulties and cognitive and mentation related challenges --Reevaluated by speech pathologist multiple times with recommendations for n.p.o. except for ice chips -- 3)Acute on chronic macrocytic Anemia/MGUS/chronic thrombocytopenia-- -No obvious bleeding noted -There is been concern of MDS Versus other myeloproliferative disorder -Family previously deferred bone marrow biopsy - Hgb is up to around 8 from 6.9 after transfusion--, H&H may drop further due to I dextrose  iV fluids/hemodilution - Received 1 unit of PRBC on 12/18/2023--for total of 2 units of PRBC this admission -Platelets was down to 14 (at time of admission) - No overt bleeding - Continue avoiding heparin  products and use SCDs for DVT prophylaxis. - Most recent platelet count: > 100 K  4)Acute metabolic encephalopathy in the setting of SIRS--please see #1 above - -CT head without acute finding --Patient remains with very poor cognition -Excessive salivation improving with scopolamine  patch---May discontinue scopolamine  patch as this may be contributing to lethargy--May use Robinul  as needed excessive secretions -Mild elevation of serum ammonia and mild hypernatremia noted--not enough to explain extent of metabolic encephalopathy -patient is more awake and more alert since discontinuing scopolamine  patch  5)Acute kidney injury on chronic kidney disease 3B -Continue to monitor urine output -Creatinine currently less than 3 (creatinine peaked at 4.79 this admission) - Poor baseline functional status with underlying dementia and deemed not a candidate for dialysis.  6)Chronic diastolic heart failure -04/22/23 Echo--EF 50-55%, no WMA, normal RVF  -Patient unable to take oral medications safely at this time - Be judicious with IV fluid--persistent hypoglycemia and  inability to safely take oral intake necessitates ongoing IV dextrose  infusion  7)Mild Hypernatremia--due to dehydration  in the setting of lack of oral intake. -iv  D5 water  as ordered - Hypernatremia resolved with dextrose  water   8-Transaminitis - In the setting of shock of liver - Continue fluid resuscitation and follow LFTs intermittently.  9)H/o Coronary artery disease --s/p DES x2 to RCA 2004  No ACS type symptoms at this time -Restart statin when safe to give oral intake - May give aspirin  rectally for now until safe to give orally  10)history of vascular dementia--per daughter [name]--- patient's functional status declined prior to admission to the point of pocketing food and not being able to swallow and eat safely at SNF for the last couple of weeks prior to admission C/n supportive Rx -- 10)Ethics/social/failure to thrive - Goals of care and poor prognosis discussed with family members  -Further conversations with patient's daughter [name]---[phone] on 12/18/2023 --Patient remains DNR/DNI- I have discussed with patient's POA ([name]).   --- Patient is Not a candidate for PEG tube given underlying dementia - Insurance declined possible transfer to LTAC -- Family conference at bedside on 12/20/23 with patient's primary RN present, patient's biological daughter [name] present, patient's adopted [name] present,other family members including [name] and [name] present at bedside. -- Family at this time reported that they are not ready to transition to comfort care - - Family reiterates that they are Not ready to de-escalate treatment protocols at this time - Please see palliative care provider note dated 12/21/2023  11)Nutrition --inability to safely swallow as outlined above #10 -- Speech pathologist evaluated patient multiple times ---recommends n.p.o. status as patient unable to tolerate oral intake -still unable to have oral intake due  to swallowing difficulties and cognitive and mentation related challenges - IV dextrose  as above  12)Hypokalemia-- resolved with replacement  DVT prophylaxis: SCDs Code Status: DNR/DNI Family Communication: Discussed with Daughter [name] , multiple failed members at bedside Disposition: ?? Hospice appropriate  Status is: Inpatient    Consultants:  Palliative care Speech pathologist  Procedures: See below for x-ray reports.  Antimicrobials:  Vancomycin  has been discontinued Completed 10 days of cefepime    Subjective: - - -No new concerns - No fevers - Able to answer questions with one-word answers  Objective: Vitals:   12/23/23 1959 12/24/23 0442 12/24/23 0610 12/24/23 1317  BP: 138/74 135/79  (!) 144/74  Pulse: (!) 58 (!) 57  (!) 44  Resp: 20 20    Temp: (!) 97.4 F (36.3 C) (!) 96.9 F (36.1 C) (!) 97.4 F (36.3 C) (!) 97.4 F (36.3 C)  TempSrc: Oral Rectal Axillary Oral  SpO2: 99% 92%  98%  Weight:      Height:        Intake/Output Summary (Last 24 hours) at 12/24/2023 1947 Last data filed at 12/24/2023 0551 Gross per 24 hour  Intake 560 ml  Output 100 ml  Net 460 ml   Filed Weights   12/20/23 1512 12/20/23 1515 12/22/23 0628  Weight: 91.2 kg 91.2 kg 98.6 kg    Physical Exam Gen:-More awake, no acute distress HEENT:- [place].AT, No sclera icterus Neck-Supple Neck,No JVD,.  Lungs-  CTAB , fair air movement bilaterally  CV- S1, S2 normal, RRR Abd-  +ve B.Sounds, Abd Soft, No tenderness,    Extremity/Skin:- +ve  edema (right upper extremity is more swollen),   good pedal pulses  Psych-affect is flat, able to answer simple questions with one-word answers,  cooperative Neuro-Generalized Weakness,  no additional new focal deficits, no tremors  CBC: Recent Labs  Lab 12/18/23 0545 12/19/23 0608 12/20/23 0440 12/21/23 0424 12/22/23 0424  WBC 6.2 5.9  --  5.3  --   HGB 6.9* 8.0* 7.9* 7.8* 8.0*  HCT 21.0* 24.6* 23.8* 23.9* 25.0*   MCV 101.9* 103.8*  --  102.6*  --   PLT 121* 119*  --  112*  --    Basic Metabolic Panel: Recent Labs  Lab 12/18/23 0514 12/18/23 0545 12/19/23 0608 12/20/23 0440 12/21/23 0424 12/22/23 0424 12/23/23 0422  NA  --    < > 148* 146* 146* 142 141  K  --    < > 2.8* 2.9* 3.0* 3.8 3.5  CL  --    < > 107 108 107 105 103  CO2  --    < > 29 29 29 26 27   GLUCOSE  --    < > 117* 146* 128* 126* 122*  BUN  --    < > 76* 67* 62* 57* 58*  CREATININE  --    < > 3.39* 2.84* 2.75* 2.77* 2.66*  CALCIUM   --    < > 8.6* 8.6* 8.7* 8.7* 8.9  MG 2.3  --   --   --  2.2  --   --   PHOS  --   --   --   --  3.0  --   --    < > = values in this interval not displayed.   GFR: Estimated Creatinine Clearance: 19.8 mL/min (A) (by C-G formula based on SCr of 2.66 mg/dL (H)).  Liver Function Tests: Recent Labs  Lab 12/19/23 0608 12/20/23 0440 12/21/23 0424 12/23/23 0422  AST 40 42*  --  45*  ALT 47* 43  --  40  ALKPHOS 135* 125  --  140*  BILITOT 1.9* 1.8*  --  2.9*  PROT 6.2* 6.1*  --  6.6  ALBUMIN 3.1* 3.1* 3.3* 3.2*   CBG: Recent Labs  Lab 12/22/23 2357 12/23/23 1115 12/23/23 1807 12/24/23 0000 12/24/23 1117  GLUCAP 130* 101* 99 115* 118*   Scheduled Meds:  aspirin   300 mg Rectal Q T,Th,S,Su   Chlorhexidine  Gluconate Cloth  6 each Topical Daily   liver oil-zinc  oxide   Topical BID   mupirocin  ointment   Nasal BID   mouth rinse  15 mL Mouth Rinse 4 times per day   Continuous Infusions:  dextrose       LOS: 16 days   Rendall Carwin, MD [facility] To contact the attending provider between 7A-7P or the covering provider during after hours 7P-7A, please log into the web site [email or web address] and access using universal [place] password for that web site. If you do not have the password, please call the hospital operator.  12/24/2023, 7:47 PM

## 2023-12-24 NOTE — Plan of Care (Signed)
° ° ° °  Referral previously received for Terrance Miller for goals of care discussion. Noted most recent palliative in-person assessment dated 12/21/2023 at which time it was recommended to follow from a distance/chart check for TRH support but do not engage with family per their request.  Chart reviewed for Recent provider notes, nurse notes, TOC notes, vitals, and labs and updates received from RN.   At this time patient appears stable, unable to significantly eat.  Remains on a dextrose  drip.  SLP continues to engage and continues to recommend n.p.o. with ice chips for comfort given poor swallow and high risk for aspiration.  Family not wanting to de-escalate care at this time.  No plan for in person follow-up at this time. Please contact the palliative medicine provider on service for any new/urgent needs that require our assistance with this patient.  Thank you for your referral and allowing PMT to assist in [facility] care.   Camellia Kays, NP Palliative Medicine Team Phone: [phone]  NO CHARGE

## 2023-12-24 NOTE — Progress Notes (Signed)
 PT Cancellation Note  Patient Details Name: Terrance Miller MRN: [id number] DOB: 1928/01/10   Cancelled Treatment:    Reason Eval/Treat Not Completed: Fatigue/lethargy limiting ability to participate   Montie Metro, PT CLT [phone]  12/24/2023, 3:36 PM

## 2023-12-24 NOTE — Plan of Care (Signed)
°  Problem: Education: Goal: Knowledge of General Education information will improve Description: Including pain rating scale, medication(s)/side effects and non-pharmacologic comfort measures Outcome: Progressing   Problem: Health Behavior/Discharge Planning: Goal: Ability to manage health-related needs will improve Outcome: Progressing   Problem: Clinical Measurements: Goal: Ability to maintain clinical measurements within normal limits will improve Outcome: Progressing Goal: Will remain free from infection Outcome: Progressing Goal: Diagnostic test results will improve Outcome: Progressing Goal: Respiratory complications will improve Outcome: Progressing Goal: Cardiovascular complication will be avoided Outcome: Progressing   Problem: Activity: Goal: Risk for activity intolerance will decrease Outcome: Progressing   Problem: Nutrition: Goal: Adequate nutrition will be maintained Outcome: Progressing   Problem: Coping: Goal: Level of anxiety will decrease Outcome: Progressing   Problem: Elimination: Goal: Will not experience complications related to bowel motility Outcome: Progressing Goal: Will not experience complications related to urinary retention Outcome: Progressing   Problem: Pain Managment: Goal: General experience of comfort will improve and/or be controlled Outcome: Progressing   Problem: Safety: Goal: Ability to remain free from injury will improve Outcome: Progressing   Problem: Skin Integrity: Goal: Risk for impaired skin integrity will decrease Outcome: Progressing   Problem: Fluid Volume: Goal: Hemodynamic stability will improve Outcome: Progressing   Problem: Clinical Measurements: Goal: Diagnostic test results will improve Outcome: Progressing Goal: Signs and symptoms of infection will decrease Outcome: Progressing   Problem: Respiratory: Goal: Ability to maintain adequate ventilation will improve Outcome: Progressing   Problem:  Education: Goal: Ability to demonstrate management of disease process will improve Outcome: Progressing Goal: Ability to verbalize understanding of medication therapies will improve Outcome: Progressing Goal: Individualized Educational Video(s) Outcome: Progressing   Problem: Activity: Goal: Capacity to carry out activities will improve Outcome: Progressing   Problem: Cardiac: Goal: Ability to achieve and maintain adequate cardiopulmonary perfusion will improve Outcome: Progressing

## 2023-12-25 DIAGNOSIS — R6521 Severe sepsis with septic shock: Secondary | ICD-10-CM | POA: Diagnosis not present

## 2023-12-25 DIAGNOSIS — A419 Sepsis, unspecified organism: Secondary | ICD-10-CM | POA: Diagnosis not present

## 2023-12-25 LAB — CULTURE, BLOOD (ROUTINE X 2)
Culture: NO GROWTH
Culture: NO GROWTH
Special Requests: ADEQUATE
Special Requests: ADEQUATE

## 2023-12-25 LAB — BASIC METABOLIC PANEL WITH GFR
Anion gap: 13 (ref 5–15)
BUN: 53 mg/dL — ABNORMAL HIGH (ref 8–23)
CO2: 24 mmol/L (ref 22–32)
Calcium: 8.8 mg/dL — ABNORMAL LOW (ref 8.9–10.3)
Chloride: 101 mmol/L (ref 98–111)
Creatinine, Ser: 2.67 mg/dL — ABNORMAL HIGH (ref 0.61–1.24)
GFR, Estimated: 22 mL/min — ABNORMAL LOW
Glucose, Bld: 104 mg/dL — ABNORMAL HIGH (ref 70–99)
Potassium: 3.5 mmol/L (ref 3.5–5.1)
Sodium: 138 mmol/L (ref 135–145)

## 2023-12-25 LAB — GLUCOSE, CAPILLARY
Glucose-Capillary: 111 mg/dL — ABNORMAL HIGH (ref 70–99)
Glucose-Capillary: 116 mg/dL — ABNORMAL HIGH (ref 70–99)
Glucose-Capillary: 125 mg/dL — ABNORMAL HIGH (ref 70–99)
Glucose-Capillary: 85 mg/dL (ref 70–99)
Glucose-Capillary: 94 mg/dL (ref 70–99)

## 2023-12-25 NOTE — Progress Notes (Signed)
 SLP Cancellation Note  Patient Details Name: Terrance Miller MRN: [id number] DOB: 01/03/1929   Cancelled treatment:       Reason Eval/Treat Not Completed: Fatigue/lethargy limiting ability to participate;Patient's level of consciousness. Pt not appropriate for PO trials, ST will continue efforts  Tenzin Edelman H. Clois KILLIAN, CCC-SLP Speech Language Pathologist    [name] 12/25/2023, 11:11 AM

## 2023-12-25 NOTE — Plan of Care (Signed)

## 2023-12-25 NOTE — Plan of Care (Signed)
" °  Problem: Education: Goal: Knowledge of General Education information will improve Description: Including pain rating scale, medication(s)/side effects and non-pharmacologic comfort measures Outcome: Not Applicable   Problem: Health Behavior/Discharge Planning: Goal: Ability to manage health-related needs will improve Outcome: Not Applicable   Problem: Clinical Measurements: Goal: Ability to maintain clinical measurements within normal limits will improve Outcome: Not Applicable Goal: Will remain free from infection Outcome: Not Applicable Goal: Diagnostic test results will improve Outcome: Not Applicable Goal: Respiratory complications will improve Outcome: Not Applicable Goal: Cardiovascular complication will be avoided Outcome: Not Applicable   Problem: Activity: Goal: Risk for activity intolerance will decrease Outcome: Not Applicable   Problem: Nutrition: Goal: Adequate nutrition will be maintained Outcome: Not Applicable   Problem: Coping: Goal: Level of anxiety will decrease Outcome: Not Applicable   Problem: Elimination: Goal: Will not experience complications related to bowel motility Outcome: Not Applicable Goal: Will not experience complications related to urinary retention Outcome: Not Applicable   Problem: Pain Managment: Goal: General experience of comfort will improve and/or be controlled Outcome: Not Applicable   Problem: Safety: Goal: Ability to remain free from injury will improve Outcome: Not Applicable   Problem: Skin Integrity: Goal: Risk for impaired skin integrity will decrease Outcome: Not Applicable   Problem: Fluid Volume: Goal: Hemodynamic stability will improve Outcome: Not Applicable   Problem: Clinical Measurements: Goal: Diagnostic test results will improve Outcome: Not Applicable Goal: Signs and symptoms of infection will decrease Outcome: Not Applicable   Problem: Respiratory: Goal: Ability to maintain adequate  ventilation will improve Outcome: Not Applicable   Problem: Education: Goal: Ability to demonstrate management of disease process will improve Outcome: Not Applicable Goal: Ability to verbalize understanding of medication therapies will improve Outcome: Not Applicable Goal: Individualized Educational Video(s) Outcome: Not Applicable   Problem: Activity: Goal: Capacity to carry out activities will improve Outcome: Not Applicable   Problem: Cardiac: Goal: Ability to achieve and maintain adequate cardiopulmonary perfusion will improve Outcome: Not Applicable   "

## 2023-12-25 NOTE — Progress Notes (Signed)
 " PROGRESS NOTE    Terrance Miller  FMW:[id number] DOB: 1928/04/03 DOA: 12/08/2023 PCP: [name], DO   Brief Narrative:      89 y.o. male with medical history significant for HTN, HLD, MGUS/chronic microcytic anemia, CKD stage IIIB, diet-controlled diabetes mellitus type 2, chronic thrombocytopenia, dementia, HFpEF/chronic diastolic dysfunction CHF,, h/o CAD, PAFib, h/o BPH with history of prior hydrocelectomy admitted on 12/08/2023 from SNF facility with altered mentation/acute metabolic encephalopathy, hypothermia and hypotension with concerns for possible infection   Patient was admitted from 5/28 to 6/2 and presented with similar presentation where he had hypothermia, bradycardia and hypotension as well as C. difficile colitis. Patient is now struggling with acute metabolic encephalopathy and appears to have poor recovery of mental status despite treatment of sepsis as well as persistent hypoglycemia.  He overall has poor prognosis and despite conversations with family members, they have difficulty with drawing care and providing end-of-life support.  Assessment & Plan:   Principal Problem:   Septic shock (HCC) Active Problems:   Palliative care by specialist  Assessment and Plan:   1)SIRS ---SIRS pathophysiology has resolved Hypotension and Hypothermia resolved initially Off Levophed  Urine cx neg, chest x-ray without definite pneumonia ---completed at least 10 days of broad-spectrum antibiotics already-including Cefepime  and Vanco -Recurrent episodes of hypothermia---currently off warming blanket -A.m. cortisol on 12/08/2022 was 20.9 -Recent ACTH  stimulation test during prior admission was WNL ----- TSH  3.6 - UA not suggestive of UTI - Repeat blood cultures from 12/20/2023 NGTD - patient is more awake and more alert since discontinuing scopolamine  patch   2)Persistent Hypoglycemia--due to inability of patient to take oral intake- -per daughter at least for the last couple  of weeks prior to admission to the hospital while at SNF patient was unable to eat safely he was pocketing food and the daughter at times had to take the food out of his mouth while at SNF --No further hypoglycemia while on iv dextrose - -continue IV dextrose  for now--- palliative care consult from 12/21/23 appreciated -Consider Ethics consult due to concerns about futility of prolonged treatment with IV dextrose  solution -- Not a candidate for feeding tube due to advanced dementia -still unable to have oral intake due to swallowing difficulties and cognitive and mentation related challenges --Reevaluated by speech pathologist multiple times with recommendations for n.p.o. except for ice chips -- 3)Acute on chronic macrocytic Anemia/MGUS/chronic thrombocytopenia-- -No obvious bleeding noted -There is been concern of MDS Versus other myeloproliferative disorder -Family previously deferred bone marrow biopsy - Hgb is up to around 8 from 6.9 after transfusion--, H&H may drop further due to I dextrose  iV fluids/hemodilution - Received 1 unit of PRBC on 12/18/2023--for total of 2 units of PRBC this admission -Platelets was down to 14 (at time of admission) - No overt bleeding - Continue avoiding heparin  products and use SCDs for DVT prophylaxis. - Most recent platelet count: > 100 K   4)Acute metabolic encephalopathy in the setting of SIRS--please see #1 above-now with persistent cognitive disorder with overall poor prognosis - -CT head without acute finding --Patient remains with very poor cognition -Excessive salivation improving with scopolamine  patch---May discontinue scopolamine  patch as this may be contributing to lethargy--May use Robinul  as needed excessive secretions -Mild elevation of serum ammonia and mild hypernatremia noted--not enough to explain extent of metabolic encephalopathy -patient is more awake and more alert since discontinuing scopolamine  patch   5)Acute kidney injury on  chronic kidney disease 3B -Continue to monitor urine output -Creatinine currently less  than 3 (creatinine peaked at 4.79 this admission) - Poor baseline functional status with underlying dementia and deemed not a candidate for dialysis.   6)Chronic diastolic heart failure -04/22/23 Echo--EF 50-55%, no WMA, normal RVF  -Patient unable to take oral medications safely at this time - Be judicious with IV fluid--persistent hypoglycemia and inability to safely take oral intake necessitates ongoing IV dextrose  infusion   7)Mild Hypernatremia--due to dehydration in the setting of lack of oral intake. -iv  D5 water  as ordered - Hypernatremia resolved with dextrose  water    8-Transaminitis - In the setting of shock of liver - Continue fluid resuscitation and follow LFTs intermittently.   9)H/o Coronary artery disease --s/p DES x2 to RCA 2004  No ACS type symptoms at this time -Restart statin when safe to give oral intake - May give aspirin  rectally for now until safe to give orally   10)history of vascular dementia--per daughter Terrance--- patient's functional status declined prior to admission to the point of pocketing food and not being able to swallow and eat safely at SNF for the last couple of weeks prior to admission C/n supportive Rx -- 10)Ethics/social/failure to thrive - Goals of care and poor prognosis discussed with family members  -Further conversations with patient's daughter Ms Terrance Miller---[phone] on 12/18/2023 --Patient remains DNR/DNI- I have discussed with patient's POA (Ms. Miller).   --- Patient is Not a candidate for PEG tube given underlying dementia - Insurance declined possible transfer to LTAC -- Family conference at bedside on 12/20/23 with patient's primary RN present, patient's biological daughter Terrance Miller present, patient's adopted [name] present,other family members including Terrance Miller and [name] present at bedside. -- Family at this time  reported that they are not ready to transition to comfort care - - Family reiterates that they are Not ready to de-escalate treatment protocols at this time - Please see palliative care provider note dated 12/21/2023   11)Nutrition --inability to safely swallow as outlined above #10 -- Speech pathologist evaluated patient multiple times ---recommends n.p.o. status as patient unable to tolerate oral intake -still unable to have oral intake due to swallowing difficulties and cognitive and mentation related challenges - IV dextrose  as above    DVT prophylaxis: SCDs Code Status: DNR/DNI Family Communication: None at bedside Disposition Plan:  Status is: Inpatient Remains inpatient appropriate because: Need for ongoing IV fluids   Consultants:  Palliative care  Procedures:  None  Antimicrobials:  Anti-infectives (From admission, onward)    Start     Dose/Rate Route Frequency Ordered Stop   12/13/23 1200  vancomycin  (VANCOREADY) IVPB 750 mg/150 mL  Status:  Discontinued        750 mg 150 mL/hr over 60 Minutes Intravenous Every 48 hours 12/12/23 0925 12/12/23 1251   12/10/23 1200  vancomycin  (VANCOREADY) IVPB 750 mg/150 mL  Status:  Discontinued        750 mg 150 mL/hr over 60 Minutes Intravenous Every 48 hours 12/10/23 1022 12/12/23 0925   12/10/23 0500  ceFEPIme  (MAXIPIME ) 1 g in sodium chloride  0.9 % 100 mL IVPB        1 g 200 mL/hr over 30 Minutes Intravenous Every 24 hours 12/09/23 0753 12/17/23 0456   12/08/23 1327  vancomycin  variable dose per unstable renal function (pharmacist dosing)  Status:  Discontinued         Does not apply See admin instructions 12/08/23 1327 12/12/23 1251   12/08/23 0500  vancomycin  (VANCOREADY) IVPB 1500 mg/300 mL  1,500 mg 150 mL/hr over 120 Minutes Intravenous  Once 12/08/23 0459 12/08/23 0817   12/08/23 0500  ceFEPIme  (MAXIPIME ) 2 g in sodium chloride  0.9 % 100 mL IVPB  Status:  Discontinued        2 g 200 mL/hr over 30 Minutes  Intravenous Every 24 hours 12/08/23 0500 12/09/23 0753       Subjective: Patient seen and evaluated today with no acute concerns noted overnight.  He opens his eyes to questioning, otherwise does not verbally answer any questions.  He remains on IV fluid infusion.  No family at bedside.  Objective: Vitals:   12/24/23 0610 12/24/23 1317 12/24/23 2010 12/25/23 0443  BP:  (!) 144/74 126/83 114/76  Pulse:  (!) 44  (!) 53  Resp:   14 19  Temp: (!) 97.4 F (36.3 C) (!) 97.4 F (36.3 C) 97.7 F (36.5 C) 98.8 F (37.1 C)  TempSrc: Axillary Oral Oral Oral  SpO2:  98% 100% 97%  Weight:      Height:        Intake/Output Summary (Last 24 hours) at 12/25/2023 1112 Last data filed at 12/25/2023 9077 Gross per 24 hour  Intake 0 ml  Output 300 ml  Net -300 ml   Filed Weights   12/20/23 1512 12/20/23 1515 12/22/23 0628  Weight: 91.2 kg 91.2 kg 98.6 kg    Examination:  General exam: Appears calm and comfortable  Respiratory system: Clear to auscultation. Respiratory effort normal. Cardiovascular system: S1 & S2 heard, RRR.  Gastrointestinal system: Abdomen is soft Central nervous system: Arousable and makes eye contact to tactile stimulus Extremities: No edema Skin: No significant lesions noted    Data Reviewed: I have personally reviewed following labs and imaging studies  CBC: Recent Labs  Lab 12/19/23 0608 12/20/23 0440 12/21/23 0424 12/22/23 0424  WBC 5.9  --  5.3  --   HGB 8.0* 7.9* 7.8* 8.0*  HCT 24.6* 23.8* 23.9* 25.0*  MCV 103.8*  --  102.6*  --   PLT 119*  --  112*  --    Basic Metabolic Panel: Recent Labs  Lab 12/20/23 0440 12/21/23 0424 12/22/23 0424 12/23/23 0422 12/25/23 0437  NA 146* 146* 142 141 138  K 2.9* 3.0* 3.8 3.5 3.5  CL 108 107 105 103 101  CO2 29 29 26 27 24   GLUCOSE 146* 128* 126* 122* 104*  BUN 67* 62* 57* 58* 53*  CREATININE 2.84* 2.75* 2.77* 2.66* 2.67*  CALCIUM  8.6* 8.7* 8.7* 8.9 8.8*  MG  --  2.2  --   --   --   PHOS  --   3.0  --   --   --    GFR: Estimated Creatinine Clearance: 19.7 mL/min (A) (by C-G formula based on SCr of 2.67 mg/dL (H)). Liver Function Tests: Recent Labs  Lab 12/19/23 0608 12/20/23 0440 12/21/23 0424 12/23/23 0422  AST 40 42*  --  45*  ALT 47* 43  --  40  ALKPHOS 135* 125  --  140*  BILITOT 1.9* 1.8*  --  2.9*  PROT 6.2* 6.1*  --  6.6  ALBUMIN 3.1* 3.1* 3.3* 3.2*   No results for input(s): LIPASE, AMYLASE in the last 168 hours. Recent Labs  Lab 12/19/23 0608  AMMONIA 45*   Coagulation Profile: No results for input(s): INR, PROTIME in the last 168 hours. Cardiac Enzymes: No results for input(s): CKTOTAL, CKMB, CKMBINDEX, TROPONINI in the last 168 hours. BNP (last 3 results) No results for  input(s): PROBNP in the last 8760 hours. HbA1C: No results for input(s): HGBA1C in the last 72 hours. CBG: Recent Labs  Lab 12/23/23 1807 12/24/23 0000 12/24/23 1117 12/25/23 0001 12/25/23 0553  GLUCAP 99 115* 118* 111* 116*   Lipid Profile: No results for input(s): CHOL, HDL, LDLCALC, TRIG, CHOLHDL, LDLDIRECT in the last 72 hours. Thyroid  Function Tests: No results for input(s): TSH, T4TOTAL, FREET4, T3FREE, THYROIDAB in the last 72 hours. Anemia Panel: No results for input(s): VITAMINB12, FOLATE, FERRITIN, TIBC, IRON, RETICCTPCT in the last 72 hours. Sepsis Labs: No results for input(s): PROCALCITON, LATICACIDVEN in the last 168 hours.  Recent Results (from the past 240 hours)  Culture, blood (Routine X 2) w Reflex to ID Panel     Status: None   Collection Time: 12/20/23  2:31 PM   Specimen: Left Antecubital; Blood  Result Value Ref Range Status   Specimen Description   Final    LEFT ANTECUBITAL BOTTLES DRAWN AEROBIC AND ANAEROBIC   Special Requests Blood Culture adequate volume  Final   Culture   Final    NO GROWTH 5 DAYS Performed at [facility], [address] [place], [place] [address]    Report Status  12/25/2023 FINAL  Final  Culture, blood (Routine X 2) w Reflex to ID Panel     Status: None   Collection Time: 12/20/23  2:31 PM   Specimen: BLOOD LEFT HAND  Result Value Ref Range Status   Specimen Description   Final    BLOOD LEFT HAND BOTTLES DRAWN AEROBIC AND ANAEROBIC   Special Requests Blood Culture adequate volume  Final   Culture   Final    NO GROWTH 5 DAYS Performed at [facility], [address], [place], [place] [address]    Report Status 12/25/2023 FINAL  Final         Radiology Studies: No results found.      Scheduled Meds:  aspirin   300 mg Rectal Q T,Th,S,Su   Chlorhexidine  Gluconate Cloth  6 each Topical Daily   liver oil-zinc  oxide   Topical BID   mupirocin  ointment   Nasal BID   mouth rinse  15 mL Mouth Rinse 4 times per day   Continuous Infusions:  dextrose  70 mL/hr at 12/25/23 0734     LOS: 17 days    Time spent: 35 minutes    Sahan Pen JONETTA Fairly, DO [facility]  If 7PM-7AM, please contact night-coverage [email or web address] 12/25/2023, 11:12 AM   "

## 2023-12-26 DIAGNOSIS — A419 Sepsis, unspecified organism: Secondary | ICD-10-CM | POA: Diagnosis not present

## 2023-12-26 DIAGNOSIS — R6521 Severe sepsis with septic shock: Secondary | ICD-10-CM | POA: Diagnosis not present

## 2023-12-26 LAB — BASIC METABOLIC PANEL WITH GFR
Anion gap: 12 (ref 5–15)
BUN: 51 mg/dL — ABNORMAL HIGH (ref 8–23)
CO2: 24 mmol/L (ref 22–32)
Calcium: 8.8 mg/dL — ABNORMAL LOW (ref 8.9–10.3)
Chloride: 102 mmol/L (ref 98–111)
Creatinine, Ser: 2.67 mg/dL — ABNORMAL HIGH (ref 0.61–1.24)
GFR, Estimated: 22 mL/min — ABNORMAL LOW
Glucose, Bld: 100 mg/dL — ABNORMAL HIGH (ref 70–99)
Potassium: 3.3 mmol/L — ABNORMAL LOW (ref 3.5–5.1)
Sodium: 138 mmol/L (ref 135–145)

## 2023-12-26 LAB — MAGNESIUM: Magnesium: 1.9 mg/dL (ref 1.7–2.4)

## 2023-12-26 LAB — GLUCOSE, CAPILLARY
Glucose-Capillary: 121 mg/dL — ABNORMAL HIGH (ref 70–99)
Glucose-Capillary: 122 mg/dL — ABNORMAL HIGH (ref 70–99)

## 2023-12-26 MED ORDER — POTASSIUM CHLORIDE 10 MEQ/100ML IV SOLN
10.0000 meq | INTRAVENOUS | Status: AC
  Administered 2023-12-26 (×4): 10 meq via INTRAVENOUS
  Filled 2023-12-26 (×4): qty 100

## 2023-12-26 NOTE — Progress Notes (Addendum)
 " PROGRESS NOTE    Terrance Miller  FMW:[id number] DOB: March 16, 1928 DOA: 12/08/2023 PCP: Terrance Miller   Brief Narrative:      89 y.o. male with medical history significant for HTN, HLD, MGUS/chronic microcytic anemia, CKD stage IIIB, diet-controlled diabetes mellitus type 2, chronic thrombocytopenia, dementia, HFpEF/chronic diastolic dysfunction CHF,, h/o CAD, PAFib, h/o BPH with history of prior hydrocelectomy admitted on 12/08/2023 from SNF facility with altered mentation/acute metabolic encephalopathy, hypothermia and hypotension with concerns for possible infection   Patient was admitted from 5/28 to 6/2 and presented with similar presentation where he had hypothermia, bradycardia and hypotension as well as C. difficile colitis. Patient is now struggling with acute metabolic encephalopathy and appears to have poor recovery of mental status despite treatment of sepsis as well as persistent hypoglycemia.  He overall has poor prognosis and despite conversations with family members, they have difficulty with drawing care and providing end-of-life support.  Assessment & Plan:   Principal Problem:   Septic shock (HCC) Active Problems:   Palliative care by specialist  Assessment and Plan:   1)SIRS ---SIRS pathophysiology has resolved Hypotension and Hypothermia resolved initially Off Levophed  Urine cx neg, chest x-ray without definite pneumonia ---completed at least 10 days of broad-spectrum antibiotics already-including Cefepime  and Vanco -Recurrent episodes of hypothermia---currently off warming blanket -A.m. cortisol on 12/08/2022 was 20.9 -Recent ACTH  stimulation test during prior admission was WNL ----- TSH  3.6 - UA not suggestive of UTI - Repeat blood cultures from 12/20/2023 NGTD - patient is more awake and more alert since discontinuing scopolamine  patch   2)Persistent Hypoglycemia--due to inability of patient to take oral intake- -per daughter at least for the last couple  of weeks prior to admission to the hospital while at SNF patient was unable to eat safely he was pocketing food and the daughter at times had to take the food out of his mouth while at SNF --No further hypoglycemia while on iv dextrose - -continue IV dextrose  for now--- palliative care consult from 12/21/23 appreciated -Consider Ethics consult due to concerns about futility of prolonged treatment with IV dextrose  solution -- Not a candidate for feeding tube due to advanced dementia -still unable to have oral intake due to swallowing difficulties and cognitive and mentation related challenges --Reevaluated by speech pathologist multiple times with recommendations for n.p.o. except for ice chips -- 3)Acute on chronic macrocytic Anemia/MGUS/chronic thrombocytopenia-- -No obvious bleeding noted -There is been concern of MDS Versus other myeloproliferative disorder -Family previously deferred bone marrow biopsy - Hgb is up to around 8 from 6.9 after transfusion--, H&H may drop further due to I dextrose  iV fluids/hemodilution - Received 1 unit of PRBC on 12/18/2023--for total of 2 units of PRBC this admission -Platelets was down to 14 (at time of admission) - No overt bleeding - Continue avoiding heparin  products and use SCDs for DVT prophylaxis. - Most recent platelet count: > 100 K   4)Acute metabolic encephalopathy in the setting of SIRS--please see #1 above-now with persistent cognitive disorder with overall poor prognosis - -CT head without acute finding --Patient remains with very poor cognition -Excessive salivation improving with scopolamine  patch---May discontinue scopolamine  patch as this may be contributing to lethargy--May use Robinul  as needed excessive secretions -Mild elevation of serum ammonia and mild hypernatremia noted--not enough to explain extent of metabolic encephalopathy -patient is more awake and more alert since discontinuing scopolamine  patch   5)Acute kidney injury on  chronic kidney disease 3B -Continue to monitor urine output -Creatinine currently less  than 3 (creatinine peaked at 4.79 this admission) - Poor baseline functional status with underlying dementia and deemed not a candidate for dialysis.   6)Chronic diastolic heart failure -04/22/23 Echo--EF 50-55%, no WMA, normal RVF  -Patient unable to take oral medications safely at this time - Be judicious with IV fluid--persistent hypoglycemia and inability to safely take oral intake necessitates ongoing IV dextrose  infusion   7)Mild hypokalemia -Replete IV and reassess in a.m.   8-Transaminitis - In the setting of shock of liver - Continue fluid resuscitation and follow LFTs intermittently.   9)H/o Coronary artery disease --s/p DES x2 to RCA 2004  No ACS type symptoms at this time -Restart statin when safe to give oral intake - May give aspirin  rectally for now until safe to give orally   10)history of vascular dementia--per daughter Terrance Miller--- patient's functional status declined prior to admission to the point of pocketing food and not being able to swallow and eat safely at SNF for the last couple of weeks prior to admission C/n supportive Rx -- 10)Ethics/social/failure to thrive - Goals of care and poor prognosis discussed with family members  -Further conversations with patient's daughter Terrance Miller---[phone] on 12/18/2023 --Patient remains DNR/DNI- I have discussed with patient's POA (Terrance Miller).   --- Patient is Not a candidate for PEG tube given underlying dementia - Insurance declined possible transfer to LTAC -- Family conference at bedside on 12/20/23 with patient's primary RN present, patient's biological daughter Terrance Miller present, patient's adopted Terrance Miller present,other family members including Terrance Miller and Terrance Miller present at bedside. -- Family at this time reported that they are not ready to transition to comfort care - - Family reiterates that they are  Not ready to de-escalate treatment protocols at this time - Please see palliative care provider note dated 12/21/2023   11)Nutrition --inability to safely swallow as outlined above #10 -- Speech pathologist evaluated patient multiple times ---recommends n.p.o. status as patient unable to tolerate oral intake -still unable to have oral intake due to swallowing difficulties and cognitive and mentation related challenges - IV dextrose  as above    DVT prophylaxis: SCDs Code Status: DNR/DNI Family Communication: Discussed extensively with daughter on phone 12/20 Disposition Plan:  Status is: Inpatient Remains inpatient appropriate because: Need for ongoing IV fluids   Consultants:  Palliative care  Procedures:  None  Antimicrobials:  Anti-infectives (From admission, onward)    Start     Dose/Rate Route Frequency Ordered Stop   12/13/23 1200  vancomycin  (VANCOREADY) IVPB 750 mg/150 mL  Status:  Discontinued        750 mg 150 mL/hr over 60 Minutes Intravenous Every 48 hours 12/12/23 0925 12/12/23 1251   12/10/23 1200  vancomycin  (VANCOREADY) IVPB 750 mg/150 mL  Status:  Discontinued        750 mg 150 mL/hr over 60 Minutes Intravenous Every 48 hours 12/10/23 1022 12/12/23 0925   12/10/23 0500  ceFEPIme  (MAXIPIME ) 1 g in sodium chloride  0.9 % 100 mL IVPB        1 g 200 mL/hr over 30 Minutes Intravenous Every 24 hours 12/09/23 0753 12/17/23 0456   12/08/23 1327  vancomycin  variable dose per unstable renal function (pharmacist dosing)  Status:  Discontinued         Does not apply See admin instructions 12/08/23 1327 12/12/23 1251   12/08/23 0500  vancomycin  (VANCOREADY) IVPB 1500 mg/300 mL        1,500 mg 150 mL/hr over 120 Minutes Intravenous  Once 12/08/23 0459 12/08/23 0817   12/08/23 0500  ceFEPIme  (MAXIPIME ) 2 g in sodium chloride  0.9 % 100 mL IVPB  Status:  Discontinued        2 g 200 mL/hr over 30 Minutes Intravenous Every 24 hours 12/08/23 0500 12/09/23 0753        Subjective: Patient seen and evaluated today with no acute concerns noted overnight.  He opens his eyes to questioning, otherwise does not verbally answer any questions.  He remains on IV fluid infusion.  No family at bedside.  Objective: Vitals:   12/25/23 1426 12/25/23 2041 12/26/23 0451 12/26/23 1424  BP: (!) 140/70 135/64 123/82 (!) 140/82  Pulse: 60 99 61 60  Resp: 20 20 16 18   Temp: 98 F (36.7 C) 97.7 F (36.5 C) 98.9 F (37.2 C) 97.8 F (36.6 C)  TempSrc: Axillary Oral Oral Axillary  SpO2: 98% 94% 97% 96%  Weight:      Height:        Intake/Output Summary (Last 24 hours) at 12/26/2023 1442 Last data filed at 12/26/2023 1300 Gross per 24 hour  Intake 975.99 ml  Output 400 ml  Net 575.99 ml   Filed Weights   12/20/23 1512 12/20/23 1515 12/22/23 0628  Weight: 91.2 kg 91.2 kg 98.6 kg    Examination:  General exam: Appears calm and comfortable  Respiratory system: Clear to auscultation. Respiratory effort normal. Cardiovascular system: S1 & S2 heard, RRR.  Gastrointestinal system: Abdomen is soft Central nervous system: Arousable and makes eye contact to tactile stimulus Extremities: No edema Skin: No significant lesions noted    Data Reviewed: I have personally reviewed following labs and imaging studies  CBC: Recent Labs  Lab 12/20/23 0440 12/21/23 0424 12/22/23 0424  WBC  --  5.3  --   HGB 7.9* 7.8* 8.0*  HCT 23.8* 23.9* 25.0*  MCV  --  102.6*  --   PLT  --  112*  --    Basic Metabolic Panel: Recent Labs  Lab 12/21/23 0424 12/22/23 0424 12/23/23 0422 12/25/23 0437 12/26/23 0407  NA 146* 142 141 138 138  K 3.0* 3.8 3.5 3.5 3.3*  CL 107 105 103 101 102  CO2 29 26 27 24 24   GLUCOSE 128* 126* 122* 104* 100*  BUN 62* 57* 58* 53* 51*  CREATININE 2.75* 2.77* 2.66* 2.67* 2.67*  CALCIUM  8.7* 8.7* 8.9 8.8* 8.8*  MG 2.2  --   --   --  1.9  PHOS 3.0  --   --   --   --    GFR: Estimated Creatinine Clearance: 19.7 mL/min (A) (by C-G formula  based on SCr of 2.67 mg/dL (H)). Liver Function Tests: Recent Labs  Lab 12/20/23 0440 12/21/23 0424 12/23/23 0422  AST 42*  --  45*  ALT 43  --  40  ALKPHOS 125  --  140*  BILITOT 1.8*  --  2.9*  PROT 6.1*  --  6.6  ALBUMIN 3.1* 3.3* 3.2*   No results for input(s): LIPASE, AMYLASE in the last 168 hours. No results for input(s): AMMONIA in the last 168 hours.  Coagulation Profile: No results for input(s): INR, PROTIME in the last 168 hours. Cardiac Enzymes: No results for input(s): CKTOTAL, CKMB, CKMBINDEX, TROPONINI in the last 168 hours. BNP (last 3 results) No results for input(s): PROBNP in the last 8760 hours. HbA1C: No results for input(s): HGBA1C in the last 72 hours. CBG: Recent Labs  Lab 12/25/23 0001 12/25/23 0553 12/25/23 1143  12/25/23 1806 12/25/23 2048  GLUCAP 111* 116* 125* 94 85   Lipid Profile: No results for input(s): CHOL, HDL, LDLCALC, TRIG, CHOLHDL, LDLDIRECT in the last 72 hours. Thyroid  Function Tests: No results for input(s): TSH, T4TOTAL, FREET4, T3FREE, THYROIDAB in the last 72 hours. Anemia Panel: No results for input(s): VITAMINB12, FOLATE, FERRITIN, TIBC, IRON, RETICCTPCT in the last 72 hours. Sepsis Labs: No results for input(s): PROCALCITON, LATICACIDVEN in the last 168 hours.  Recent Results (from the past 240 hours)  Culture, blood (Routine X 2) w Reflex to ID Panel     Status: None   Collection Time: 12/20/23  2:31 PM   Specimen: Left Antecubital; Blood  Result Value Ref Range Status   Specimen Description   Final    LEFT ANTECUBITAL BOTTLES DRAWN AEROBIC AND ANAEROBIC   Special Requests Blood Culture adequate volume  Final   Culture   Final    NO GROWTH 5 DAYS Performed at [facility], [address], [place], [place] [address]    Report Status 12/25/2023 FINAL  Final  Culture, blood (Routine X 2) w Reflex to ID Panel     Status: None   Collection Time: 12/20/23  2:31  PM   Specimen: BLOOD LEFT HAND  Result Value Ref Range Status   Specimen Description   Final    BLOOD LEFT HAND BOTTLES DRAWN AEROBIC AND ANAEROBIC   Special Requests Blood Culture adequate volume  Final   Culture   Final    NO GROWTH 5 DAYS Performed at [facility], [address], [place], [place] [address]    Report Status 12/25/2023 FINAL  Final         Radiology Studies: No results found.      Scheduled Meds:  aspirin   300 mg Rectal Q T,Th,S,Su   Chlorhexidine  Gluconate Cloth  6 each Topical Daily   liver oil-zinc  oxide   Topical BID   mupirocin  ointment   Nasal BID   mouth rinse  15 mL Mouth Rinse 4 times per day   Continuous Infusions:  dextrose  70 mL/hr at 12/26/23 1252     LOS: 18 days    Time spent: 35 minutes    Crissy Mccreadie JONETTA Fairly, [facility]  If 7PM-7AM, please contact night-coverage [email or web address] 12/26/2023, 2:42 PM   "

## 2023-12-26 NOTE — Plan of Care (Signed)
" °  Problem: Education: Goal: Knowledge of General Education information will improve Description: Including pain rating scale, medication(s)/side effects and non-pharmacologic comfort measures 12/26/2023 1612 by Antone Dorothyann CROME, LPN Outcome: Not Progressing 12/26/2023 1612 by Antone Dorothyann CROME, LPN Reactivated   "

## 2023-12-26 NOTE — Progress Notes (Signed)
 Nurse at bedside. 4 runs of IV potassium have finished patient tolerated them well, no restlessness or discomfort was noted. D5 is still running at 70ml/hr as ordered.

## 2023-12-27 DIAGNOSIS — A419 Sepsis, unspecified organism: Secondary | ICD-10-CM | POA: Diagnosis not present

## 2023-12-27 DIAGNOSIS — R6521 Severe sepsis with septic shock: Secondary | ICD-10-CM | POA: Diagnosis not present

## 2023-12-27 LAB — GLUCOSE, CAPILLARY
Glucose-Capillary: 102 mg/dL — ABNORMAL HIGH (ref 70–99)
Glucose-Capillary: 106 mg/dL — ABNORMAL HIGH (ref 70–99)
Glucose-Capillary: 109 mg/dL — ABNORMAL HIGH (ref 70–99)
Glucose-Capillary: 110 mg/dL — ABNORMAL HIGH (ref 70–99)

## 2023-12-27 LAB — BASIC METABOLIC PANEL WITH GFR
Anion gap: 12 (ref 5–15)
BUN: 47 mg/dL — ABNORMAL HIGH (ref 8–23)
CO2: 24 mmol/L (ref 22–32)
Calcium: 8.8 mg/dL — ABNORMAL LOW (ref 8.9–10.3)
Chloride: 101 mmol/L (ref 98–111)
Creatinine, Ser: 2.53 mg/dL — ABNORMAL HIGH (ref 0.61–1.24)
GFR, Estimated: 23 mL/min — ABNORMAL LOW
Glucose, Bld: 104 mg/dL — ABNORMAL HIGH (ref 70–99)
Potassium: 3.5 mmol/L (ref 3.5–5.1)
Sodium: 137 mmol/L (ref 135–145)

## 2023-12-27 LAB — MAGNESIUM: Magnesium: 1.9 mg/dL (ref 1.7–2.4)

## 2023-12-27 MED ORDER — KCL IN DEXTROSE-NACL 20-5-0.9 MEQ/L-%-% IV SOLN: INTRAVENOUS | Status: AC

## 2023-12-27 NOTE — Plan of Care (Signed)
   Problem: Education: Goal: Knowledge of General Education information will improve Description Including pain rating scale, medication(s)/side effects and non-pharmacologic comfort measures Outcome: Progressing

## 2023-12-27 NOTE — Progress Notes (Signed)
 " PROGRESS NOTE    Terrance Miller  FMW:[id number] DOB: [birth year] DOA: 12/08/2023 PCP: [name], DO   Brief Narrative:      89 y.o. male with medical history significant for HTN, HLD, MGUS/chronic microcytic anemia, CKD stage IIIB, diet-controlled diabetes mellitus type 2, chronic thrombocytopenia, dementia, HFpEF/chronic diastolic dysfunction CHF,, h/o CAD, PAFib, h/o BPH with history of prior hydrocelectomy admitted on 12/08/2023 from SNF facility with altered mentation/acute metabolic encephalopathy, hypothermia and hypotension with concerns for possible infection   Patient was admitted from 5/28 to 6/2 and presented with similar presentation where he had hypothermia, bradycardia and hypotension as well as C. difficile colitis. Patient is now struggling with acute metabolic encephalopathy and appears to have poor recovery of mental status despite treatment of sepsis as well as persistent hypoglycemia.  He overall has poor prognosis and despite conversations with family members, they have difficulty with drawing care and providing end-of-life support.  Assessment & Plan:   Principal Problem:   Septic shock (HCC) Active Problems:   Palliative care by specialist  Assessment and Plan:   1)SIRS ---SIRS pathophysiology has resolved Hypotension and Hypothermia resolved initially Off Levophed  Urine cx neg, chest x-ray without definite pneumonia ---completed at least 10 days of broad-spectrum antibiotics already-including Cefepime  and Vanco -Recurrent episodes of hypothermia---currently off warming blanket -A.m. cortisol on 12/08/2022 was 20.9 -Recent ACTH  stimulation test during prior admission was WNL ----- TSH  3.6 - UA not suggestive of UTI - Repeat blood cultures from 12/20/2023 NGTD - patient is more awake and more alert since discontinuing scopolamine  patch   2)Persistent Hypoglycemia--due to inability of patient to take oral intake- -per daughter at least for the last couple  of weeks prior to admission to the hospital while at SNF patient was unable to eat safely he was pocketing food and the daughter at times had to take the food out of his mouth while at SNF --No further hypoglycemia while on iv dextrose - -continue IV dextrose  for now--- palliative care consult from 12/21/23 appreciated -Consider Ethics consult due to concerns about futility of prolonged treatment with IV dextrose  solution -- Not a candidate for feeding tube due to advanced dementia -still unable to have oral intake due to swallowing difficulties and cognitive and mentation related challenges --Reevaluated by speech pathologist multiple times with recommendations for n.p.o. except for ice chips -- 3)Acute on chronic macrocytic Anemia/MGUS/chronic thrombocytopenia-- -No obvious bleeding noted -There is been concern of MDS Versus other myeloproliferative disorder -Family previously deferred bone marrow biopsy - Hgb is up to around 8 from 6.9 after transfusion--, H&H may drop further due to I dextrose  iV fluids/hemodilution - Received 1 unit of PRBC on 12/18/2023--for total of 2 units of PRBC this admission -Platelets was down to 14 (at time of admission) - No overt bleeding - Continue avoiding heparin  products and use SCDs for DVT prophylaxis. - Most recent platelet count: > 100 K   4)Acute metabolic encephalopathy in the setting of SIRS--please see #1 above-now with persistent cognitive disorder with overall poor prognosis - -CT head without acute finding --Patient remains with very poor cognition -Excessive salivation improving with scopolamine  patch---May discontinue scopolamine  patch as this may be contributing to lethargy--May use Robinul  as needed excessive secretions -Mild elevation of serum ammonia and mild hypernatremia noted--not enough to explain extent of metabolic encephalopathy -patient is more awake and more alert since discontinuing scopolamine  patch   5)Acute kidney injury on  chronic kidney disease 3B -Continue to monitor urine output -Creatinine currently less  than 3 (creatinine peaked at 4.79 this admission) - Poor baseline functional status with underlying dementia and deemed not a candidate for dialysis.   6)Chronic diastolic heart failure -04/22/23 Echo--EF 50-55%, no WMA, normal RVF  -Patient unable to take oral medications safely at this time - Be judicious with IV fluid--persistent hypoglycemia and inability to safely take oral intake necessitates ongoing IV dextrose  infusion   7)Mild hypokalemia -Replete IV and reassess in a.m.   8-Transaminitis - In the setting of shock of liver - Continue fluid resuscitation and follow LFTs intermittently.   9)H/o Coronary artery disease --s/p DES x2 to RCA 2004  No ACS type symptoms at this time -Restart statin when safe to give oral intake - May give aspirin  rectally for now until safe to give orally   10)history of vascular dementia--per daughter [name]--- patient's functional status declined prior to admission to the point of pocketing food and not being able to swallow and eat safely at SNF for the last couple of weeks prior to admission C/n supportive Rx -- 10)Ethics/social/failure to thrive - Goals of care and poor prognosis discussed with family members  -Further conversations with patient's daughter [name]---[phone] on 12/18/2023 --Patient remains DNR/DNI- I have discussed with patient's POA ([name]).   --- Patient is Not a candidate for PEG tube given underlying dementia - Insurance declined possible transfer to LTAC -- Family conference at bedside on 12/20/23 with patient's primary RN present, patient's biological daughter [name] present, patient's adopted [name] present,other family members including [name] and [name] present at bedside. -- Family at this time reported that they are not ready to transition to comfort care - - Family reiterates that they are  Not ready to de-escalate treatment protocols at this time - Please see palliative care provider note dated 12/21/2023   11)Nutrition --inability to safely swallow as outlined above #10 -- Speech pathologist evaluated patient multiple times ---recommends n.p.o. status as patient unable to tolerate oral intake -still unable to have oral intake due to swallowing difficulties and cognitive and mentation related challenges - IV dextrose  as above    DVT prophylaxis: SCDs Code Status: DNR/DNI Family Communication: Discussed extensively with daughter on phone 12/20 Disposition Plan:  Status is: Inpatient Remains inpatient appropriate because: Need for ongoing IV fluids   Consultants:  Palliative care  Procedures:  None  Antimicrobials:  Anti-infectives (From admission, onward)    Start     Dose/Rate Route Frequency Ordered Stop   12/13/23 1200  vancomycin  (VANCOREADY) IVPB 750 mg/150 mL  Status:  Discontinued        750 mg 150 mL/hr over 60 Minutes Intravenous Every 48 hours 12/12/23 0925 12/12/23 1251   12/10/23 1200  vancomycin  (VANCOREADY) IVPB 750 mg/150 mL  Status:  Discontinued        750 mg 150 mL/hr over 60 Minutes Intravenous Every 48 hours 12/10/23 1022 12/12/23 0925   12/10/23 0500  ceFEPIme  (MAXIPIME ) 1 g in sodium chloride  0.9 % 100 mL IVPB        1 g 200 mL/hr over 30 Minutes Intravenous Every 24 hours 12/09/23 0753 12/17/23 0456   12/08/23 1327  vancomycin  variable dose per unstable renal function (pharmacist dosing)  Status:  Discontinued         Does not apply See admin instructions 12/08/23 1327 12/12/23 1251   12/08/23 0500  vancomycin  (VANCOREADY) IVPB 1500 mg/300 mL        1,500 mg 150 mL/hr over 120 Minutes Intravenous  Once 12/08/23 0459 12/08/23 0817   12/08/23 0500  ceFEPIme  (MAXIPIME ) 2 g in sodium chloride  0.9 % 100 mL IVPB  Status:  Discontinued        2 g 200 mL/hr over 30 Minutes Intravenous Every 24 hours 12/08/23 0500 12/09/23 0753        Subjective: Patient seen and evaluated today with no acute concerns noted overnight.  He opens his eyes to questioning, otherwise does not verbally answer any questions.  He remains on IV fluid infusion.  No family at bedside.  Objective: Vitals:   12/26/23 0451 12/26/23 1424 12/26/23 1931 12/27/23 0300  BP: 123/82 (!) 140/82 (!) 141/77 (!) 148/86  Pulse: 61 60 88 (!) 50  Resp: 16 18 18 16   Temp: 98.9 F (37.2 C) 97.8 F (36.6 C) 97.8 F (36.6 C) 98 F (36.7 C)  TempSrc: Oral Axillary Axillary Axillary  SpO2: 97% 96% 96% 100%  Weight:      Height:        Intake/Output Summary (Last 24 hours) at 12/27/2023 1039 Last data filed at 12/27/2023 0500 Gross per 24 hour  Intake 1242.13 ml  Output 300 ml  Net 942.13 ml   Filed Weights   12/20/23 1512 12/20/23 1515 12/22/23 0628  Weight: 91.2 kg 91.2 kg 98.6 kg    Examination:  General exam: Appears calm and comfortable  Respiratory system: Clear to auscultation. Respiratory effort normal. Cardiovascular system: S1 & S2 heard, RRR.  Gastrointestinal system: Abdomen is soft Central nervous system: Arousable and makes eye contact to tactile stimulus Extremities: No edema Skin: No significant lesions noted    Data Reviewed: I have personally reviewed following labs and imaging studies  CBC: Recent Labs  Lab 12/21/23 0424 12/22/23 0424  WBC 5.3  --   HGB 7.8* 8.0*  HCT 23.9* 25.0*  MCV 102.6*  --   PLT 112*  --    Basic Metabolic Panel: Recent Labs  Lab 12/21/23 0424 12/22/23 0424 12/23/23 0422 12/25/23 0437 12/26/23 0407 12/27/23 0406  NA 146* 142 141 138 138 137  K 3.0* 3.8 3.5 3.5 3.3* 3.5  CL 107 105 103 101 102 101  CO2 29 26 27 24 24 24   GLUCOSE 128* 126* 122* 104* 100* 104*  BUN 62* 57* 58* 53* 51* 47*  CREATININE 2.75* 2.77* 2.66* 2.67* 2.67* 2.53*  CALCIUM  8.7* 8.7* 8.9 8.8* 8.8* 8.8*  MG 2.2  --   --   --  1.9 1.9  PHOS 3.0  --   --   --   --   --    GFR: Estimated Creatinine Clearance:  20.8 mL/min (A) (by C-G formula based on SCr of 2.53 mg/dL (H)). Liver Function Tests: Recent Labs  Lab 12/21/23 0424 12/23/23 0422  AST  --  45*  ALT  --  40  ALKPHOS  --  140*  BILITOT  --  2.9*  PROT  --  6.6  ALBUMIN 3.3* 3.2*   No results for input(s): LIPASE, AMYLASE in the last 168 hours. No results for input(s): AMMONIA in the last 168 hours.  Coagulation Profile: No results for input(s): INR, PROTIME in the last 168 hours. Cardiac Enzymes: No results for input(s): CKTOTAL, CKMB, CKMBINDEX, TROPONINI in the last 168 hours. BNP (last 3 results) No results for input(s): PROBNP in the last 8760 hours. HbA1C: No results for input(s): HGBA1C in the last 72 hours. CBG: Recent Labs  Lab 12/25/23 1806 12/25/23 2048 12/26/23 1851 12/26/23 2313 12/27/23 0719  GLUCAP 94 85 121* 122* 109*   Lipid Profile: No results for input(s): CHOL, HDL, LDLCALC, TRIG, CHOLHDL, LDLDIRECT in the last 72 hours. Thyroid  Function Tests: No results for input(s): TSH, T4TOTAL, FREET4, T3FREE, THYROIDAB in the last 72 hours. Anemia Panel: No results for input(s): VITAMINB12, FOLATE, FERRITIN, TIBC, IRON, RETICCTPCT in the last 72 hours. Sepsis Labs: No results for input(s): PROCALCITON, LATICACIDVEN in the last 168 hours.  Recent Results (from the past 240 hours)  Culture, blood (Routine X 2) w Reflex to ID Panel     Status: None   Collection Time: 12/20/23  2:31 PM   Specimen: Left Antecubital; Blood  Result Value Ref Range Status   Specimen Description   Final    LEFT ANTECUBITAL BOTTLES DRAWN AEROBIC AND ANAEROBIC   Special Requests Blood Culture adequate volume  Final   Culture   Final    NO GROWTH 5 DAYS Performed at [facility], [address], [place], [place] [address]    Report Status 12/25/2023 FINAL  Final  Culture, blood (Routine X 2) w Reflex to ID Panel     Status: None   Collection Time: 12/20/23  2:31 PM    Specimen: BLOOD LEFT HAND  Result Value Ref Range Status   Specimen Description   Final    BLOOD LEFT HAND BOTTLES DRAWN AEROBIC AND ANAEROBIC   Special Requests Blood Culture adequate volume  Final   Culture   Final    NO GROWTH 5 DAYS Performed at [facility], [address], [place], [place] [address]    Report Status 12/25/2023 FINAL  Final         Radiology Studies: No results found.      Scheduled Meds:  aspirin   300 mg Rectal Q T,Th,S,Su   Chlorhexidine  Gluconate Cloth  6 each Topical Daily   liver oil-zinc  oxide   Topical BID   mupirocin  ointment   Nasal BID   mouth rinse  15 mL Mouth Rinse 4 times per day   Continuous Infusions:  dextrose  5 % and 0.9 % NaCl with KCl 20 mEq/L 50 mL/hr at 12/27/23 0750     LOS: 19 days    Time spent: 35 minutes    Latia Mataya JONETTA Fairly, DO [facility]  If 7PM-7AM, please contact night-coverage [email or web address] 12/27/2023, 10:39 AM   "

## 2023-12-27 NOTE — Plan of Care (Signed)
" °  Problem: Education: Goal: Knowledge of General Education information will improve Description: Including pain rating scale, medication(s)/side effects and non-pharmacologic comfort measures 12/27/2023 0712 by Myrtie Knee, RN Outcome: Progressing 12/27/2023 0712 by Myrtie Knee, RN Outcome: Progressing   "

## 2023-12-28 DIAGNOSIS — A419 Sepsis, unspecified organism: Secondary | ICD-10-CM | POA: Diagnosis not present

## 2023-12-28 DIAGNOSIS — R6521 Severe sepsis with septic shock: Secondary | ICD-10-CM | POA: Diagnosis not present

## 2023-12-28 LAB — BASIC METABOLIC PANEL WITH GFR
Anion gap: 11 (ref 5–15)
BUN: 43 mg/dL — ABNORMAL HIGH (ref 8–23)
CO2: 24 mmol/L (ref 22–32)
Calcium: 8.7 mg/dL — ABNORMAL LOW (ref 8.9–10.3)
Chloride: 104 mmol/L (ref 98–111)
Creatinine, Ser: 2.33 mg/dL — ABNORMAL HIGH (ref 0.61–1.24)
GFR, Estimated: 25 mL/min — ABNORMAL LOW
Glucose, Bld: 106 mg/dL — ABNORMAL HIGH (ref 70–99)
Potassium: 3.6 mmol/L (ref 3.5–5.1)
Sodium: 139 mmol/L (ref 135–145)

## 2023-12-28 LAB — GLUCOSE, CAPILLARY
Glucose-Capillary: 93 mg/dL (ref 70–99)
Glucose-Capillary: 97 mg/dL (ref 70–99)

## 2023-12-28 LAB — MAGNESIUM: Magnesium: 1.9 mg/dL (ref 1.7–2.4)

## 2023-12-28 NOTE — Plan of Care (Signed)
" ° ° ° °  Referral previously received for Terrance Miller for goals of care discussion. Noted most recent palliative in-person assessment dated 12/21/2023 at which time it was recommended to follow from a distance/chart check for TRH support but do not engage with family per their request.   Chart reviewed for Recent provider notes, nurse notes, vitals, and labs and updates received from RN and hospitalist. Message received from nursing and hospitalist Dr. Maree that family may be considering comfort transition. Ethics aware in case involvement needed.   At this time patient appears to be unchanged, still unsafe for oral diet due to swallowing difficulties and cognitive/mentation challenges. Remains on dextrose  drip due to hypoglycemia and inability to tolerate oral intake.   Discussed with Dr. Maree. Family likely coming to bedside today, hopeful for decision on comfort. If not, will consult ethics formally. I discussed palliative involvement for TOC/ethics support, but not engaging with patient/family per their explicit request on multiple occasions. Will not engage unless they specifically request/authorize reinvolvement.  No plan for in person follow-up at this time. Please contact the palliative medicine provider on service for any new/urgent needs that require our assistance with this patient.  Thank you for your referral and allowing PMT to assist in [facility] care.   Camellia Kays, NP Palliative Medicine Team Phone: [phone]  NO CHARGE  "

## 2023-12-28 NOTE — TOC Progression Note (Signed)
 Transition of Care [facility]) - Progression Note    Patient Details  Name: Terrance Miller MRN: [id number] Date of Birth: Feb 12, 1928  [facility] CM/SW Contact  Sharlyne Stabs, RN Phone Number: 12/28/2023, 3:51 PM  Clinical Narrative:   Discharge planning, [name] does not have a male bed, family did not pay to hold. CM spoke with patient daughter to update her. She states that family will have discussion tonight to go over the options. They may consider taking him home with palliative, but I do not feel they are understanding palliative care.They are questioning if patient can stay here under palliative. I explained he can stay under hospice if [facility] accepts and until they have a bed a [facility]. She will discuss these things with her family. IPCM following.    Expected Discharge Plan: Long Term Nursing Home Barriers to Discharge: Continued Medical Work up    Expected Discharge Plan and Services    Social Drivers of Health (SDOH) Interventions SDOH Screenings   Food Insecurity: No Food Insecurity (12/08/2023)  Housing: Low Risk (12/08/2023)  Transportation Needs: No Transportation Needs (12/08/2023)  Utilities: Not At Risk (12/08/2023)  Alcohol Screen: Low Risk (01/05/2023)  Depression (PHQ2-9): Medium Risk (03/17/2023)  Financial Resource Strain: Low Risk (01/05/2023)  Physical Activity: Inactive (01/05/2023)  Social Connections: Unknown (06/03/2023)  Recent Concern: Social Connections - Socially Isolated (05/04/2023)  Stress: No Stress Concern Present (01/05/2023)  Tobacco Use: Medium Risk (12/08/2023)  Health Literacy: Adequate Health Literacy (01/05/2023)    Readmission Risk Interventions    12/08/2023    4:11 PM 06/04/2023    3:46 PM 04/24/2023    1:31 PM  Readmission Risk Prevention Plan  Transportation Screening Complete Complete Complete  HRI or Home Care Consult   Complete  Social Work Consult for Recovery Care Planning/Counseling   Complete   Palliative Care Screening   Complete  Medication Review [employer]) Complete Complete Complete  PCP or Specialist appointment within 3-5 days of discharge Not Complete    HRI or Home Care Consult Complete Complete   SW Recovery Care/Counseling Consult Complete Complete   Palliative Care Screening Not Complete Not Applicable   Skilled Nursing Facility Complete Complete

## 2023-12-28 NOTE — Progress Notes (Signed)
 Speech Language Pathology Treatment: Dysphagia  Patient Details Name: Terrance Miller MRN: [id number] DOB: [birth year] Today's Date: 12/28/2023 Time: 8550-8485 SLP Time Calculation (min) (ACUTE ONLY): 25 min  Assessment / Plan / Recommendation Clinical Impression  Pt seen for ongoing dysphagia intervention and son present for part of our visit. Pt is very alert at this time and allowed oral care and followed simple directions. He was agreeable for some PO intake, but then quickly stated, I don't want no more. Pt assessed with ice chips, thin water  via straw sips (labial spillage with cup sips left anterior), and orange sherbet. Pt without overt coughing, but does exhibit oral holding and min oral residue after the swallow with sherbet. Pt clears with residuals with alternating sips of water  via straw. Pt is appropriate for D1/puree and thin via straw sips at this time as long as he remains alert, upright, and willing to take. Suspect Pt will be unlikely to meet nutritional needs given lack of interest in eating/drinking and ongoing cognitive decline (variable alertness). Discussed with medical team and will initiate diet.    HPI HPI: 89 y.o. male with past medical history of dementia, HTN, HLD, CKDIV, DM2 admitted on 12/08/2023 with septic shock due to UTI- required pressors but they are off now, AKI. Mental status is recovering poorly. BSE requested and recommended NPO.  ST f/u for po readiness.      SLP Plan  Continue with current plan of care;Other (Comment);Consult other service (comment) (palliative)        Swallow Evaluation Recommendations   Recommendations: PO diet (see notes,  NPO vs puree/thin) PO Diet Recommendation: Dysphagia 1 (Pureed);Thin liquids (Level 0) Liquid Administration via: Straw Medication Administration: Crushed with puree Supervision: Full assist for feeding;Full supervision/cueing for swallowing strategies Postural changes: Position pt fully upright for  meals;Stay upright 30-60 min after meals Oral care recommendations: Oral care before ice chips/water  Recommended consults: Consider Palliative care Caregiver Recommendations: Have oral suction available     Recommendations                     Oral care QID;Staff/trained caregiver to provide oral care   Frequent or constant Supervision/Assistance Dysphagia, unspecified (R13.10)     Continue with current plan of care;Other (Comment);Consult other service (comment) (palliative)    Thank you,  Terrance Miller, CCC-SLP [phone]  Terrance Miller  12/28/2023, 3:24 PM

## 2023-12-28 NOTE — Plan of Care (Signed)
   Problem: Education: Goal: Knowledge of General Education information will improve Description Including pain rating scale, medication(s)/side effects and non-pharmacologic comfort measures Outcome: Progressing

## 2023-12-28 NOTE — Progress Notes (Signed)
 Occupational Therapy Treatment Patient Details Name: Terrance Miller MRN: [id number] DOB: [birth year] Today's Date: 12/28/2023   History of present illness Terrance Miller is a 89 y.o. male with medical history significant of hypertension, hyperlipidemia, MGUS, CKD stage III, diet-controlled diabetes mellitus type 2, thrombocytopenia, dementia, HFpEF, coronary disease who presents to the emergency department via EMS due to altered mental status.  She was noted with altered mental status for the SNF yesterday in the evening with heart rate in 30s and patient was hypotensive with SBP in the 70s.   OT comments  Pt agreeable to OT treatment. Pt required max A for bed mobility supine to sit and sit to supine. Pt was able to sit at EOB with B UE without other physical assist for a couple minutes between reps of standing. Mod to max A for sit to stand from EOB. Much assist needed but the pt did assist while using the RW. Pt requesting to be put back in bed. Pt left in the bed with call bell within reach. Pt will benefit from continued OT in the hospital to increase strength, balance, and endurance for safe ADL's.         If plan is discharge home, recommend the following:  Two people to help with walking and/or transfers;Two people to help with bathing/dressing/bathroom;A lot of help with bathing/dressing/bathroom;Assistance with cooking/housework;Assistance with feeding;Direct supervision/assist for medications management;Direct supervision/assist for financial management;Assist for transportation;Help with stairs or ramp for entrance;A lot of help with walking and/or transfers   Equipment Recommendations  None recommended by OT          Precautions / Restrictions Precautions Precautions: Fall Recall of Precautions/Restrictions: Impaired Restrictions Weight Bearing Restrictions Per Provider Order: No       Mobility Bed Mobility Overal bed mobility: Needs Assistance Bed Mobility: Supine to Sit,  Sit to Supine     Supine to sit: Max assist, HOB elevated Sit to supine: Max assist, Total assist   General bed mobility comments: Assist for trunk control and B LE mobility.    Transfers Overall transfer level: Needs assistance Equipment used: Rolling walker (2 wheels) Transfers: Sit to/from Stand Sit to Stand: Mod assist, Max assist           General transfer comment: x2 reps of sit to stand from EOB with mod - max A. Pt requesting to go back to bed after initial stand, but needed to scoot up higher in the bed.     Balance Overall balance assessment: Needs assistance Sitting-balance support: Bilateral upper extremity supported, Feet supported Sitting balance-Leahy Scale: Poor Sitting balance - Comments: poor to fair with RW; able to maintain balance with B UE support and no additional assist for a brief duration. Postural control: Posterior lean Standing balance support: Reliant on assistive device for balance, During functional activity, Bilateral upper extremity supported Standing balance-Leahy Scale: Poor Standing balance comment: using RW                                             Communication Communication Communication: Impaired Factors Affecting Communication: Difficulty expressing self   Cognition Arousal: Alert Behavior During Therapy: Flat affect Cognition: No family/caregiver present to determine baseline, Cognition impaired                               Following commands:  Impaired Following commands impaired: Follows one step commands inconsistently, Follows one step commands with increased time      Cueing   Cueing Techniques: Verbal cues, Tactile cues, Visual cues                     Pertinent Vitals/ Pain       Pain Assessment Pain Assessment: Faces Faces Pain Scale: Hurts a little bit Pain Location: Back pain with anterior leaning. Pain Descriptors / Indicators: Discomfort, Sore Pain Intervention(s):  Monitored during session, Repositioned                                                          Frequency  Min 2X/week        Progress Toward Goals  OT Goals(current goals can now be found in the care plan section)  Progress towards OT goals: Progressing toward goals                                           End of Session Equipment Utilized During Treatment: Gait belt;Rolling walker (2 wheels)  OT Visit Diagnosis: Unsteadiness on feet (R26.81);Other abnormalities of gait and mobility (R26.89);Muscle weakness (generalized) (M62.81);Cognitive communication deficit (R41.841)   Activity Tolerance Patient tolerated treatment well   Patient Left with call bell/phone within reach;in bed   Nurse Communication Mobility status        Time: 8393-8378 OT Time Calculation (min): 15 min  Charges: OT General Charges $OT Visit: 1 Visit OT Treatments $Therapeutic Activity: 8-22 mins  [facility] OT, MOT  Jayson Person 12/28/2023, 4:34 PM

## 2023-12-28 NOTE — Plan of Care (Signed)
   Problem: Education: Goal: Knowledge of General Education information will improve Description: Including pain rating scale, medication(s)/side effects and non-pharmacologic comfort measures Outcome: Not Progressing

## 2023-12-28 NOTE — Progress Notes (Signed)
 " PROGRESS NOTE    Terrance Miller  FMW:[id number] DOB: 05/03/1928 DOA: 12/08/2023 PCP: [name], DO   Brief Narrative:      89 y.o. male with medical history significant for HTN, HLD, MGUS/chronic microcytic anemia, CKD stage IIIB, diet-controlled diabetes mellitus type 2, chronic thrombocytopenia, dementia, HFpEF/chronic diastolic dysfunction CHF,, h/o CAD, PAFib, h/o BPH with history of prior hydrocelectomy admitted on 12/08/2023 from SNF facility with altered mentation/acute metabolic encephalopathy, hypothermia and hypotension with concerns for possible infection   Patient was admitted from 5/28 to 6/2 and presented with similar presentation where he had hypothermia, bradycardia and hypotension as well as C. difficile colitis. Patient is now struggling with acute metabolic encephalopathy and appears to have poor recovery of mental status despite treatment of sepsis as well as persistent hypoglycemia.  He overall has poor prognosis, but appears now more alert and after SLP evaluation is tolerating a pured diet.  Assessment & Plan:   Principal Problem:   Septic shock (HCC) Active Problems:   Palliative care by specialist  Assessment and Plan:   1)SIRS ---SIRS pathophysiology has resolved Hypotension and Hypothermia resolved initially Off Levophed  Urine cx neg, chest x-ray without definite pneumonia ---completed at least 10 days of broad-spectrum antibiotics already-including Cefepime  and Vanco -Recurrent episodes of hypothermia---currently off warming blanket -A.m. cortisol on 12/08/2022 was 20.9 -Recent ACTH  stimulation test during prior admission was WNL ----- TSH  3.6 - UA not suggestive of UTI - Repeat blood cultures from 12/20/2023 NGTD - patient is more awake and more alert since discontinuing scopolamine  patch   2)Persistent Hypoglycemia--due to inability of patient to take oral intake- -per daughter at least for the last couple of weeks prior to admission to the  hospital while at SNF patient was unable to eat safely he was pocketing food and the daughter at times had to take the food out of his mouth while at SNF --No further hypoglycemia while on iv dextrose - palliative care consult from 12/21/23 appreciated - Discussed case with ethics -- Not a candidate for feeding tube due to advanced dementia --Reevaluated by speech pathologist and now with recommendations for dysphagia 1 diet given increased alertness -- 3)Acute on chronic macrocytic Anemia/MGUS/chronic thrombocytopenia-- -No obvious bleeding noted -There is been concern of MDS Versus other myeloproliferative disorder -Family previously deferred bone marrow biopsy - Hgb is up to around 8 from 6.9 after transfusion--, H&H may drop further due to I dextrose  iV fluids/hemodilution - Received 1 unit of PRBC on 12/18/2023--for total of 2 units of PRBC this admission -Platelets was down to 14 (at time of admission) - No overt bleeding - Continue avoiding heparin  products and use SCDs for DVT prophylaxis. - Most recent platelet count: > 100 K   4)Acute metabolic encephalopathy in the setting of SIRS--please see #1 above-now with persistent cognitive disorder with overall poor prognosis - -CT head without acute finding --Patient remains with very poor cognition -Excessive salivation improving with scopolamine  patch---May discontinue scopolamine  patch as this may be contributing to lethargy--May use Robinul  as needed excessive secretions -Mild elevation of serum ammonia and mild hypernatremia noted--not enough to explain extent of metabolic encephalopathy -patient is more awake and more alert since discontinuing scopolamine  patch   5)Acute kidney injury on chronic kidney disease 3B -Continue to monitor urine output -Creatinine currently less than 3 (creatinine peaked at 4.79 this admission) - Poor baseline functional status with underlying dementia and deemed not a candidate for dialysis.    6)Chronic diastolic heart failure -04/22/23 Echo--EF 50-55%,  no WMA, normal RVF  -Patient unable to take oral medications safely at this time - No further IV fluid as patient will be on dysphagia 1 diet   7)Mild hypokalemia - Repleted   8-Transaminitis - In the setting of shock of liver - Continue fluid resuscitation and follow LFTs intermittently.   9)H/o Coronary artery disease --s/p DES x2 to RCA 2004  No ACS type symptoms at this time -Restart statin when safe to give oral intake - May give aspirin  rectally for now until safe to give orally   10)history of vascular dementia--per daughter [name]--- patient's functional status declined prior to admission to the point of pocketing food and not being able to swallow and eat safely at SNF for the last couple of weeks prior to admission C/n supportive Rx -- 10)Ethics/social/failure to thrive - Goals of care and poor prognosis discussed with family members  -Further conversations with patient's daughter [name]---[phone] on 12/18/2023 --Patient remains DNR/DNI- I have discussed with patient's POA ([name]).   --- Patient is Not a candidate for PEG tube given underlying dementia - Insurance declined possible transfer to LTAC -- Family conference at bedside on 12/20/23 with patient's primary RN present, patient's biological daughter [name] present, patient's adopted [name] present,other family members including [name] and [name] present at bedside. -- Family at this time reported that they are not ready to transition to comfort care - - Family reiterates that they are Not ready to de-escalate treatment protocols at this time - Please see palliative care provider note dated 12/21/2023   11)Nutrition --inability to safely swallow as outlined above #10 -- Speech pathologist evaluated patient multiple times ---recommends n.p.o. status as patient unable to tolerate oral intake -Patient now on dysphagia  1 diet - IV dextrose  as above    DVT prophylaxis: SCDs Code Status: DNR/DNI Family Communication: Discussed extensively with daughter on phone 12/22 Disposition Plan:  Status is: Inpatient Remains inpatient appropriate because: Need for ongoing IV fluids   Consultants:  Palliative care  Procedures:  None  Antimicrobials:  Anti-infectives (From admission, onward)    Start     Dose/Rate Route Frequency Ordered Stop   12/13/23 1200  vancomycin  (VANCOREADY) IVPB 750 mg/150 mL  Status:  Discontinued        750 mg 150 mL/hr over 60 Minutes Intravenous Every 48 hours 12/12/23 0925 12/12/23 1251   12/10/23 1200  vancomycin  (VANCOREADY) IVPB 750 mg/150 mL  Status:  Discontinued        750 mg 150 mL/hr over 60 Minutes Intravenous Every 48 hours 12/10/23 1022 12/12/23 0925   12/10/23 0500  ceFEPIme  (MAXIPIME ) 1 g in sodium chloride  0.9 % 100 mL IVPB        1 g 200 mL/hr over 30 Minutes Intravenous Every 24 hours 12/09/23 0753 12/17/23 0456   12/08/23 1327  vancomycin  variable dose per unstable renal function (pharmacist dosing)  Status:  Discontinued         Does not apply See admin instructions 12/08/23 1327 12/12/23 1251   12/08/23 0500  vancomycin  (VANCOREADY) IVPB 1500 mg/300 mL        1,500 mg 150 mL/hr over 120 Minutes Intravenous  Once 12/08/23 0459 12/08/23 0817   12/08/23 0500  ceFEPIme  (MAXIPIME ) 2 g in sodium chloride  0.9 % 100 mL IVPB  Status:  Discontinued        2 g 200 mL/hr over 30 Minutes Intravenous Every 24 hours 12/08/23 0500 12/09/23 0753  Subjective: Patient seen and evaluated today with no acute concerns noted overnight.  He seems more alert and awake.  Objective: Vitals:   12/27/23 1432 12/27/23 2100 12/28/23 0454 12/28/23 1411  BP: 133/80 139/83 139/62 (!) 143/72  Pulse: 62 (!) 47 (!) 59 60  Resp: 20  13   Temp: (!) 97.4 F (36.3 C) 97.6 F (36.4 C) (!) 97.3 F (36.3 C) (!) 97.3 F (36.3 C)  TempSrc: Axillary Oral Oral Oral  SpO2: 96% 97%  96% 100%  Weight:      Height:        Intake/Output Summary (Last 24 hours) at 12/28/2023 1648 Last data filed at 12/27/2023 2300 Gross per 24 hour  Intake 752.03 ml  Output 200 ml  Net 552.03 ml   Filed Weights   12/20/23 1512 12/20/23 1515 12/22/23 0628  Weight: 91.2 kg 91.2 kg 98.6 kg    Examination:  General exam: Appears calm and comfortable  Respiratory system: Clear to auscultation. Respiratory effort normal. Cardiovascular system: S1 & S2 heard, RRR.  Gastrointestinal system: Abdomen is soft Central nervous system: Arousable and makes eye contact to tactile stimulus Extremities: No edema Skin: No significant lesions noted    Data Reviewed: I have personally reviewed following labs and imaging studies  CBC: Recent Labs  Lab 12/22/23 0424  HGB 8.0*  HCT 25.0*   Basic Metabolic Panel: Recent Labs  Lab 12/23/23 0422 12/25/23 0437 12/26/23 0407 12/27/23 0406 12/28/23 0447  NA 141 138 138 137 139  K 3.5 3.5 3.3* 3.5 3.6  CL 103 101 102 101 104  CO2 27 24 24 24 24   GLUCOSE 122* 104* 100* 104* 106*  BUN 58* 53* 51* 47* 43*  CREATININE 2.66* 2.67* 2.67* 2.53* 2.33*  CALCIUM  8.9 8.8* 8.8* 8.8* 8.7*  MG  --   --  1.9 1.9 1.9   GFR: Estimated Creatinine Clearance: 22.6 mL/min (A) (by C-G formula based on SCr of 2.33 mg/dL (H)). Liver Function Tests: Recent Labs  Lab 12/23/23 0422  AST 45*  ALT 40  ALKPHOS 140*  BILITOT 2.9*  PROT 6.6  ALBUMIN 3.2*   No results for input(s): LIPASE, AMYLASE in the last 168 hours. No results for input(s): AMMONIA in the last 168 hours.  Coagulation Profile: No results for input(s): INR, PROTIME in the last 168 hours. Cardiac Enzymes: No results for input(s): CKTOTAL, CKMB, CKMBINDEX, TROPONINI in the last 168 hours. BNP (last 3 results) No results for input(s): PROBNP in the last 8760 hours. HbA1C: No results for input(s): HGBA1C in the last 72 hours. CBG: Recent Labs  Lab  12/27/23 0719 12/27/23 1350 12/27/23 2019 12/27/23 2357 12/28/23 1128  GLUCAP 109* 110* 106* 102* 97   Lipid Profile: No results for input(s): CHOL, HDL, LDLCALC, TRIG, CHOLHDL, LDLDIRECT in the last 72 hours. Thyroid  Function Tests: No results for input(s): TSH, T4TOTAL, FREET4, T3FREE, THYROIDAB in the last 72 hours. Anemia Panel: No results for input(s): VITAMINB12, FOLATE, FERRITIN, TIBC, IRON, RETICCTPCT in the last 72 hours. Sepsis Labs: No results for input(s): PROCALCITON, LATICACIDVEN in the last 168 hours.  Recent Results (from the past 240 hours)  Culture, blood (Routine X 2) w Reflex to ID Panel     Status: None   Collection Time: 12/20/23  2:31 PM   Specimen: Left Antecubital; Blood  Result Value Ref Range Status   Specimen Description   Final    LEFT ANTECUBITAL BOTTLES DRAWN AEROBIC AND ANAEROBIC   Special Requests Blood Culture adequate volume  Final   Culture   Final    NO GROWTH 5 DAYS Performed at [facility], [address] [place], [place] [address]    Report Status 12/25/2023 FINAL  Final  Culture, blood (Routine X 2) w Reflex to ID Panel     Status: None   Collection Time: 12/20/23  2:31 PM   Specimen: BLOOD LEFT HAND  Result Value Ref Range Status   Specimen Description   Final    BLOOD LEFT HAND BOTTLES DRAWN AEROBIC AND ANAEROBIC   Special Requests Blood Culture adequate volume  Final   Culture   Final    NO GROWTH 5 DAYS Performed at [facility], [address] [place], [place] [address]    Report Status 12/25/2023 FINAL  Final         Radiology Studies: No results found.      Scheduled Meds:  aspirin   300 mg Rectal Q T,Th,S,Su   Chlorhexidine  Gluconate Cloth  6 each Topical Daily   liver oil-zinc  oxide   Topical BID   mupirocin  ointment   Nasal BID   mouth rinse  15 mL Mouth Rinse 4 times per day      LOS: 20 days    Time spent: 35 minutes    Ronney [employer]well JONETTA Fairly, DO [facility]  If 7PM-7AM, please contact night-coverage [email or web address] 12/28/2023, 4:48 PM   "

## 2023-12-29 DIAGNOSIS — R6521 Severe sepsis with septic shock: Secondary | ICD-10-CM | POA: Diagnosis not present

## 2023-12-29 DIAGNOSIS — A419 Sepsis, unspecified organism: Secondary | ICD-10-CM | POA: Diagnosis not present

## 2023-12-29 LAB — GLUCOSE, CAPILLARY
Glucose-Capillary: 75 mg/dL (ref 70–99)
Glucose-Capillary: 85 mg/dL (ref 70–99)
Glucose-Capillary: 86 mg/dL (ref 70–99)
Glucose-Capillary: 88 mg/dL (ref 70–99)

## 2023-12-29 NOTE — Care Management Important Message (Signed)
 Important Message  Patient Details  Name: Terrance Miller MRN: [id number] Date of Birth: [birth year]   Important Message Given:  Yes - Medicare IM     Terrance Miller L Terrance Miller 12/29/2023, 4:01 PM

## 2023-12-29 NOTE — Plan of Care (Signed)
" °  Problem: Education: Goal: Knowledge of General Education information will improve Description: Including pain rating scale, medication(s)/side effects and non-pharmacologic comfort measures Outcome: Progressing   Problem: Clinical Measurements: Goal: Ability to maintain clinical measurements within normal limits will improve 12/29/2023 1512 by Antone Dorothyann CROME, LPN Outcome: Progressing 12/29/2023 1512 by Antone Dorothyann CROME, LPN Reactivated   Problem: Nutrition: Goal: Adequate nutrition will be maintained 12/29/2023 1512 by Antone Dorothyann CROME, LPN Outcome: Progressing 12/29/2023 1512 by Antone Dorothyann CROME, LPN Reactivated   Problem: Health Behavior/Discharge Planning: Goal: Ability to manage health-related needs will improve Reactivated   "

## 2023-12-29 NOTE — NC FL2 (Signed)
 " Terrance Miller  MEDICAID FL2 LEVEL OF CARE FORM     IDENTIFICATION  Patient Name: Terrance Miller Birthdate: 15-Nov-1928 Sex: male Admission Date (Current Location): 12/08/2023  [place] and [place] Number:  Terrance Miller [id number] Facility and Address:  [facility],  [address], [place] [address]      Provider Number: [id number]  Attending Physician Name and Address:  Maree Adron BIRCH, DO  Relative Name and Phone Number:  Dtr - [name] Pepper: [phone]    Current Level of Care: Hospital Recommended Level of Care: Skilled Nursing Facility Prior Approval Number:    Date Approved/Denied:   PASRR Number: [id number]  Discharge Plan: SNF    Current Diagnoses: Patient Active Problem List   Diagnosis Date Noted   Palliative care by specialist 12/24/2023   Septic shock (HCC) 12/08/2023   C. difficile colitis 06/05/2023   Bradycardia 06/04/2023   Hypotension 06/04/2023   Lactic acidosis 06/04/2023   Type 2 diabetes mellitus with hyperglycemia (HCC) 06/04/2023   New onset atrial fibrillation (HCC) 06/03/2023   Dementia with behavioral disturbance (HCC) 05/29/2023   Acute on chronic heart failure with preserved ejection fraction (HFpEF) (HCC) 05/10/2023   Stercoral colitis 05/04/2023   (HFpEF) heart failure with preserved ejection fraction (HCC) 05/04/2023   Nausea with vomiting 05/04/2023   DNR (do not resuscitate) 05/04/2023   FTT (failure to thrive) in adult 05/04/2023   UTI (urinary tract infection) 04/21/2023   Thrombocytopenia 04/21/2023   Acute on chronic diastolic CHF (congestive heart failure) (HCC) 04/21/2023   Obesity, Class III, BMI 40-49.9 (morbid obesity) (HCC) 04/21/2023   Injury to scrotum, penis, or foreskin 03/18/2023   Type 2 diabetes mellitus with chronic kidney disease, with long-term current use of insulin  (HCC) 12/20/2022   Glaucoma 12/20/2022   Vascular dementia without behavioral disturbance (HCC) 08/15/2022   BPH with obstruction/lower  urinary tract symptoms 08/05/2022   Aortic atherosclerosis 08/05/2022   Chronic constipation 08/05/2022   Increased intraocular pressure, bilateral 08/05/2022   Edema, peripheral 08/05/2022   Aortic regurgitation 03/17/2022   Pulmonary HTN (HCC) 03/17/2022   Dry skin dermatitis 02/05/2022   Stage 3b chronic kidney disease (HCC) 11/05/2021   MGUS (monoclonal gammopathy of unknown significance) 11/05/2021   Acute metabolic encephalopathy 07/27/2021   Macrocytic anemia 05/31/2021   History of MI (myocardial infarction) 04/29/2021   Hypothermia 05/20/2017   Severe sepsis with septic shock (HCC) 05/20/2017   Essential hypertension 09/28/2013   Type 2 diabetes mellitus with hyperlipidemia (HCC) 09/28/2013   CAD (coronary artery disease) 09/28/2013    Orientation RESPIRATION BLADDER Height & Weight      (Disoriented x 4)  Normal Indwelling catheter Weight: 98.6 kg Height:  5' 8 (172.7 cm)  BEHAVIORAL SYMPTOMS/MOOD NEUROLOGICAL BOWEL NUTRITION STATUS      Incontinent Diet  AMBULATORY STATUS COMMUNICATION OF NEEDS Skin   Extensive Assist Verbally (currently incomprehensible speech) Skin abrasions, Bruising, Other (Comment) (Redness / Abraision - Scrotum; Echymosis to Bilateral Buttocks)                       Personal Care Assistance Level of Assistance  Bathing, Feeding, Dressing, Total care Bathing Assistance: Maximum assistance Feeding assistance: Limited assistance Dressing Assistance: Maximum assistance Total Care Assistance: Maximum assistance   Functional Limitations Info  Hearing, Sight Sight Info: Impaired Hearing Info: Impaired      SPECIAL CARE FACTORS FREQUENCY  Contractures      Additional Factors Info                  Current Medications (12/29/2023):  This is the current hospital active medication list Current Facility-Administered Medications  Medication Dose Route Frequency Provider Last Rate Last Admin    acetaminophen  (TYLENOL ) tablet 650 mg  650 mg Oral Q6H PRN Ricky Fines, MD       Or   acetaminophen  (TYLENOL ) suppository 650 mg  650 mg Rectal Q6H PRN Ricky Fines, MD       aspirin  suppository 300 mg  300 mg Rectal Q T,Th,S,Su Emokpae, Courage, MD   300 mg at 12/27/23 1019   Chlorhexidine  Gluconate Cloth 2 % PADS 6 each  6 each Topical Daily Emokpae, Courage, MD   6 each at 12/29/23 0947   glycopyrrolate  (ROBINUL ) injection 0.2 mg  0.2 mg Intravenous TID PRN Pearlean Manus, MD       liver oil-zinc  oxide (DESITIN) 40 % ointment   Topical BID Ricky Fines, MD   Given at 12/29/23 [phone]   liver oil-zinc  oxide (DESITIN) 40 % ointment   Topical Daily PRN Ricky Fines, MD   Given at 12/26/23 1543   mupirocin  ointment (BACTROBAN ) 2 %   Nasal BID Ricky Fines, MD   Given at 12/29/23 9052   ondansetron  (ZOFRAN ) tablet 4 mg  4 mg Oral Q6H PRN Ricky Fines, MD       Or   ondansetron  (ZOFRAN ) injection 4 mg  4 mg Intravenous Q6H PRN Ricky Fines, MD       Oral care mouth rinse  15 mL Mouth Rinse 4 times per day Ricky Fines, MD   15 mL at 12/29/23 9093   Oral care mouth rinse  15 mL Mouth Rinse PRN Ricky Fines, MD   15 mL at 12/08/23 1924     Discharge Medications: Please see discharge summary for a list of discharge medications.  Relevant Imaging Results:  Relevant Lab Results:   Additional Information    [name] [name], RN     "

## 2023-12-29 NOTE — Progress Notes (Signed)
 Speech Language Pathology Treatment: Dysphagia  Patient Details Name: Terrance Miller MRN: [id number] DOB: June 19, 1928 Today's Date: 12/29/2023 Time: 1350-1404 SLP Time Calculation (min) (ACUTE ONLY): 14 min  Assessment / Plan / Recommendation Clinical Impression  Pt seen for ongoing dysphagia intervention, as he was started on a diet yesterday of D1/puree and thin liquids with full assist for meals due to increased alertness. Nursing reports that he ate a little of his meal, but was asking for nabs. Pt more alert and vocal with SLP this date and shared that he had oatmeal for breakfast. He took a few sips of water  via cup sip and exhibited reduced labial closure with labial spillage despite verbal cues to close lips around the cup. He pleasantly stated that he had enough. Continue with D1/puree and thin when Pt is alert and engaged and recommend f/u SLP services at next facility to assist with diet texture upgrades if appropriate (if he continues to exhibit increased alertness).    HPI HPI: 89 y.o. male with past medical history of dementia, HTN, HLD, CKDIV, DM2 admitted on 12/08/2023 with septic shock due to UTI- required pressors but they are off now, AKI. Mental status is recovering poorly. BSE requested and recommended NPO.  ST f/u for po readiness.      SLP Plan  Continue with current plan of care;Other (Comment);Consult other service (comment)        Swallow Evaluation Recommendations   Recommendations: PO diet PO Diet Recommendation: Dysphagia 1 (Pureed);Thin liquids (Level 0) Liquid Administration via: Cup;Straw Medication Administration: Crushed with puree Supervision: Staff to assist with self-feeding;Full supervision/cueing for swallowing strategies Postural changes: Position pt fully upright for meals;Stay upright 30-60 min after meals Oral care recommendations: Oral care BID (2x/day);Staff/trained caregiver to provide oral care     Recommendations                      Oral care BID;Staff/trained caregiver to provide oral care   Frequent or constant Supervision/Assistance Dysphagia, unspecified (R13.10)     Continue with current plan of care;Other (Comment);Consult other service (comment)    Thank you,  [name], CCC-SLP [phone]  Terrance Miller  12/29/2023, 2:08 PM

## 2023-12-29 NOTE — Discharge Summary (Signed)
 Physician Discharge Summary  Terrance Miller FMW:[id number] DOB: 1928-03-21 DOA: 12/08/2023  PCP: Terrance Miller  Admit date: 12/08/2023  Discharge date: 12/29/2023  Admitted From:SNF  Disposition:  SNF  Recommendations for Outpatient Follow-up:  Follow up with PCP in 1-2 weeks Continue medications as noted below  Home Health: None  Equipment/Devices: None  Discharge Condition:Stable  CODE STATUS: DNR  Diet recommendation: Dysphagia 1 diet  Brief/Interim Summary:  89 y.o. male with medical history significant for HTN, HLD, MGUS/chronic microcytic anemia, CKD stage IIIB, diet-controlled diabetes mellitus type 2, chronic thrombocytopenia, dementia, HFpEF/chronic diastolic dysfunction CHF,, h/o CAD, PAFib, h/o BPH with history of prior hydrocelectomy admitted on 12/08/2023 from SNF facility with altered mentation/acute metabolic encephalopathy, hypothermia and hypotension with concerns for possible infection   Patient was admitted from 5/28 to 6/2 and presented with similar presentation where he had hypothermia, bradycardia and hypotension as well as C. difficile colitis.  Patient was hospitalized for over 20 days due to persistent encephalopathic state with overall noted very poor prognosis in the setting of initial admission with acute metabolic encephalopathy in the setting of likely aspiration pneumonia as well as persistent hypoglycemia due to poor oral intake.  He was also noted to have AKI on CKD stage IIIb.  Multiple discussions were had with family members regarding the need for palliative/hospice care and comfort measures given advanced dementia and overall poor prognosis, however family members elected to continue full scope of care and at least maintain patient on dextrose  IV fluids.  He remained on fluids for several days as family members were trying to decide on further steps to take regarding comfort measures.  Eventually, patient became more alert and awake and was reassessed  by speech therapy on 12/22 and appears to be able to take in dysphagia 1 diet and is now tolerating this well.  He has recovered despite very poor prognosis, but continues to have similar prognosis and is at very high risk for aspiration.  He will be arranged for placement to long-term care facility and is now in stable condition for discharge.  No other acute events or concerns noted.  Discharge Diagnoses:  Principal Problem:   Septic shock [facility]) Active Problems:   Palliative care by specialist  Principal discharge diagnosis: Acute-persistent metabolic encephalopathy-multifactorial in the setting of possible aspiration pneumonia along with some initial persistent hypoglycemia.  Advanced dementia.  Discharge Instructions  Discharge Instructions     Increase activity slowly   Complete by: As directed    No wound care   Complete by: As directed       Allergies as of 12/29/2023       Reactions   Clobetasol  Other (See Comments)   Unknown  No reaction listed on MAR from facility        Medication List     STOP taking these medications    amLODipine  10 MG tablet Commonly known as: NORVASC    amoxicillin -clavulanate 500-125 MG tablet Commonly known as: AUGMENTIN    furosemide  40 MG tablet Commonly known as: LASIX    guaiFENesin  600 MG 12 hr tablet Commonly known as: MUCINEX        TAKE these medications    ammonium lactate 12 % lotion Commonly known as: LAC-HYDRIN Apply 1 Application topically as needed for dry skin.   aspirin  EC 81 MG tablet Take 1 tablet (81 mg total) by mouth daily with breakfast. Swallow whole.   bisacodyl  10 MG suppository Commonly known as: DULCOLAX Place 1 suppository (10 mg total) rectally every  Monday, Wednesday, and Friday.   cholecalciferol  1000 units tablet Commonly known as: VITAMIN D  Take 1,000 Units by mouth daily.   feeding supplement Liqd Take 237 mLs by mouth 2 (two) times daily between meals. What changed: when to take  this   ipratropium-albuterol  0.5-2.5 (3) MG/3ML Soln Commonly known as: DUONEB Take 3 mLs by nebulization every 4 (four) hours as needed (wheezing/dyspnea/O2 sats <90%).   latanoprost  0.005 % ophthalmic solution Commonly known as: XALATAN  Place 1 drop into both eyes 2 (two) times daily.   melatonin 3 MG Tabs tablet Take 3 mg by mouth at bedtime.   memantine  5 MG tablet Commonly known as: NAMENDA  Take 5 mg by mouth 2 (two) times daily.   polyethylene glycol 17 g packet Commonly known as: MIRALAX  / GLYCOLAX  Take 17 g by mouth 2 (two) times daily. What changed: when to take this   senna 8.6 MG Tabs tablet Commonly known as: SENOKOT Take 2 tablets (17.2 mg total) by mouth at bedtime.   sertraline  25 MG tablet Commonly known as: ZOLOFT  Take 25 mg by mouth daily.   tamsulosin  0.4 MG Caps capsule Commonly known as: FLOMAX  Take 1 capsule (0.4 mg total) by mouth daily.        Follow-up Information     Terrance Miller. Schedule an appointment as soon as possible for a visit in 1 week(s).   Specialty: Family Medicine Contact information: [address] [place] [address] [phone]                Allergies[1]  Consultations: Palliative care   Procedures/Studies: CT HEAD WO CONTRAST ( ) Result Date: 12/17/2023 EXAM: CT Head Without Intravenous Contrast. CLINICAL HISTORY: 89 year old male with acute neurological deficit, stroke suspected. TECHNIQUE: Axial computed tomography images of the head/brain without intravenous contrast. Dose reduction technique was used including one or more of the following: automated exposure control, adjustment of mA and kV according to patient size, and/or iterative reconstruction. CONTRAST: Without. COMPARISON: Brain MRI 05/11/2023, Head CT 05/28/2023. FINDINGS: LIMITATIONS/ARTIFACTS: Mild motion artifact today. BRAIN: No acute intraparenchymal hemorrhage. No mass lesion. No CT evidence for acute territorial infarct. No  midline shift or extra-axial collection. Stable brain volume. Gray white matter differentiation stable and normal for age. Incidental dural calcification. No suspicious intracranial vascular hyperdensity. Advanced calcified atherosclerosis at the central skull base. VENTRICLES: No hydrocephalus. ORBITS: The orbits are unremarkable. SINUSES AND MASTOIDS: Chronic paranasal sinus disease with mucoperiosteal thickening but improved compared to 05/28/2023. Tympanic cavities and right mastoids are clear. Left mastoid effusion has also regressed since 05/28/2023. SOFT TISSUES: Calcified scalp vessel atherosclerosis. No acute orbit or scalp soft tissue finding. No radiopaque foreign body is seen. BONES: No acute skull fracture. IMPRESSION: 1. Stable and negative for age non-contrast head CT appearance of the brain. 2. Advanced calcified atherosclerosis 3. Chronic paranasal sinus disease improved since May. Electronically signed by: Helayne Hurst MD 12/17/2023 12:38 PM EST RP Workstation: [id number]   DG Chest Port 1 View Result Date: 12/08/2023 CLINICAL DATA:  Hypotension. EXAM: PORTABLE CHEST 1 VIEW COMPARISON:  Jun 03, 2023 FINDINGS: Limited study secondary to patient rotation. The cardiac silhouette is mildly enlarged. This may be, in part, technical in origin. There is marked severity calcification of the aortic arch. Low lung volumes are noted with mild atelectatic changes seen within the bilateral lung bases. No pleural effusion or pneumothorax is identified. Multilevel degenerative changes are present throughout the thoracic spine. IMPRESSION: Stable cardiomegaly with low lung volumes with mild  bibasilar atelectasis. Electronically Signed   By: Suzen Dials M.D.   On: 12/08/2023 00:40     Discharge Exam: Vitals:   12/28/23 2035 12/29/23 0551  BP: (!) 156/79 (!) 148/86  Pulse: 64 64  Resp: 20 18  Temp: (!) 97.5 F (36.4 C) (!) 97.5 F (36.4 C)  SpO2: 98% 97%   Vitals:   12/28/23 0454 12/28/23  1411 12/28/23 2035 12/29/23 0551  BP: 139/62 (!) 143/72 (!) 156/79 (!) 148/86  Pulse: (!) 59 60 64 64  Resp: 13  20 18   Temp: (!) 97.3 F (36.3 C) (!) 97.3 F (36.3 C) (!) 97.5 F (36.4 C) (!) 97.5 F (36.4 C)  TempSrc: Oral Oral Oral Oral  SpO2: 96% 100% 98% 97%  Weight:      Height:        General: Pt is alert, awake, not in acute distress Cardiovascular: RRR, S1/S2 +, no rubs, no gallops Respiratory: CTA bilaterally, no wheezing, no rhonchi Abdominal: Soft, NT, ND, bowel sounds + Extremities: no edema, no cyanosis    The results of significant diagnostics from this hospitalization (including imaging, microbiology, ancillary and laboratory) are listed below for reference.     Microbiology: Recent Results (from the past 240 hours)  Culture, blood (Routine X 2) w Reflex to ID Panel     Status: None   Collection Time: 12/20/23  2:31 PM   Specimen: Left Antecubital; Blood  Result Value Ref Range Status   Specimen Description   Final    LEFT ANTECUBITAL BOTTLES DRAWN AEROBIC AND ANAEROBIC   Special Requests Blood Culture adequate volume  Final   Culture   Final    NO GROWTH 5 DAYS Performed at [facility], [address], [place], [place] [address]    Report Status 12/25/2023 FINAL  Final  Culture, blood (Routine X 2) w Reflex to ID Panel     Status: None   Collection Time: 12/20/23  2:31 PM   Specimen: BLOOD LEFT HAND  Result Value Ref Range Status   Specimen Description   Final    BLOOD LEFT HAND BOTTLES DRAWN AEROBIC AND ANAEROBIC   Special Requests Blood Culture adequate volume  Final   Culture   Final    NO GROWTH 5 DAYS Performed at [facility], [address] [place], [place] [address]    Report Status 12/25/2023 FINAL  Final     Labs: BNP (last 3 results) Recent Labs    12/31/22 2102 04/21/23 1431  BNP 1,097.0* 1,362.0*   Basic Metabolic Panel: Recent Labs  Lab 12/23/23 0422 12/25/23 0437 12/26/23 0407 12/27/23 0406 12/28/23 0447  NA 141 138  138 137 139  K 3.5 3.5 3.3* 3.5 3.6  CL 103 101 102 101 104  CO2 27 24 24 24 24   GLUCOSE 122* 104* 100* 104* 106*  BUN 58* 53* 51* 47* 43*  CREATININE 2.66* 2.67* 2.67* 2.53* 2.33*  CALCIUM  8.9 8.8* 8.8* 8.8* 8.7*  MG  --   --  1.9 1.9 1.9   Liver Function Tests: Recent Labs  Lab 12/23/23 0422  AST 45*  ALT 40  ALKPHOS 140*  BILITOT 2.9*  PROT 6.6  ALBUMIN 3.2*   No results for input(s): LIPASE, AMYLASE in the last 168 hours. No results for input(s): AMMONIA in the last 168 hours. CBC: No results for input(s): WBC, NEUTROABS, HGB, HCT, MCV, PLT in the last 168 hours. Cardiac Enzymes: No results for input(s): CKTOTAL, CKMB, CKMBINDEX, TROPONINI in the last 168  hours. BNP: Invalid input(s): POCBNP CBG: Recent Labs  Lab 12/27/23 2357 12/28/23 1128 12/28/23 2354 12/29/23 0555 12/29/23 1128  GLUCAP 102* 97 93 75 85   D-Dimer No results for input(s): DDIMER in the last 72 hours. Hgb A1c No results for input(s): HGBA1C in the last 72 hours. Lipid Profile No results for input(s): CHOL, HDL, LDLCALC, TRIG, CHOLHDL, LDLDIRECT in the last 72 hours. Thyroid  function studies No results for input(s): TSH, T4TOTAL, T3FREE, THYROIDAB in the last 72 hours.  Invalid input(s): FREET3 Anemia work up No results for input(s): VITAMINB12, FOLATE, FERRITIN, TIBC, IRON, RETICCTPCT in the last 72 hours. Urinalysis    Component Value Date/Time   COLORURINE YELLOW 12/20/2023 1630   APPEARANCEUR CLEAR 12/20/2023 1630   APPEARANCEUR Clear 07/16/2021 1132   LABSPEC 1.016 12/20/2023 1630   PHURINE 6.0 12/20/2023 1630   GLUCOSEU NEGATIVE 12/20/2023 1630   HGBUR SMALL (A) 12/20/2023 1630   BILIRUBINUR NEGATIVE 12/20/2023 1630   BILIRUBINUR Negative 07/16/2021 1132   KETONESUR NEGATIVE 12/20/2023 1630   PROTEINUR 100 (A) 12/20/2023 1630   NITRITE NEGATIVE 12/20/2023 1630   LEUKOCYTESUR NEGATIVE 12/20/2023 1630   Sepsis  Labs No results for input(s): WBC in the last 168 hours.  Invalid input(s): PROCALCITONIN, LACTICIDVEN Microbiology Recent Results (from the past 240 hours)  Culture, blood (Routine X 2) w Reflex to ID Panel     Status: None   Collection Time: 12/20/23  2:31 PM   Specimen: Left Antecubital; Blood  Result Value Ref Range Status   Specimen Description   Final    LEFT ANTECUBITAL BOTTLES DRAWN AEROBIC AND ANAEROBIC   Special Requests Blood Culture adequate volume  Final   Culture   Final    NO GROWTH 5 DAYS Performed at [facility], [address], [place], [place] [address]    Report Status 12/25/2023 FINAL  Final  Culture, blood (Routine X 2) w Reflex to ID Panel     Status: None   Collection Time: 12/20/23  2:31 PM   Specimen: BLOOD LEFT HAND  Result Value Ref Range Status   Specimen Description   Final    BLOOD LEFT HAND BOTTLES DRAWN AEROBIC AND ANAEROBIC   Special Requests Blood Culture adequate volume  Final   Culture   Final    NO GROWTH 5 DAYS Performed at [facility], [address] [place], [place] [address]    Report Status 12/25/2023 FINAL  Final     Time coordinating discharge: 35 minutes  SIGNED:   Adron JONETTA Fairly, [facility] 12/29/2023, 11:58 AM  If 7PM-7AM, please contact night-coverage [email or web address]     [1]  Allergies Allergen Reactions   Clobetasol  Other (See Comments)    Unknown  No reaction listed on MAR from facility

## 2023-12-29 NOTE — Plan of Care (Signed)
   Problem: Education: Goal: Knowledge of General Education information will improve Description Including pain rating scale, medication(s)/side effects and non-pharmacologic comfort measures Outcome: Progressing

## 2023-12-29 NOTE — TOC Progression Note (Signed)
 Transition of Care [facility]) - Progression Note    Patient Details  Name: Terrance Miller MRN: [id number] Date of Birth: Mar 28, 1928  [facility] CM/SW Contact  Sharlyne Stabs, RN Phone Number: 12/29/2023, 1:51 PM  Clinical Narrative:   Patient is medically ready to discharge back to [facility], He was LTC there, Family did not pay to hold his bed. Currently [facility] does not have any male beds. Family does not want palliative care of hospice care. They can not take him home. They requested for IPCM to send out for other LTC bed offers. CM sent out referral and called [facility], [place], [place], [place]. None of the facilities have male beds this week. [facility] updated and will let IPCM know if a male bed becomes available. Avoidable day added.    Expected Discharge Plan: Long Term Nursing Home Barriers to Discharge: Family Issues, Other (must enter comment) (No LTC bed)    Expected Discharge Plan and Services       Expected Discharge Date: 12/29/23                   Social Drivers of Health (SDOH) Interventions SDOH Screenings   Food Insecurity: No Food Insecurity (12/08/2023)  Housing: Low Risk (12/08/2023)  Transportation Needs: No Transportation Needs (12/08/2023)  Utilities: Not At Risk (12/08/2023)  Alcohol Screen: Low Risk (01/05/2023)  Depression (PHQ2-9): Medium Risk (03/17/2023)  Financial Resource Strain: Low Risk (01/05/2023)  Physical Activity: Inactive (01/05/2023)  Social Connections: Unknown (06/03/2023)  Recent Concern: Social Connections - Socially Isolated (05/04/2023)  Stress: No Stress Concern Present (01/05/2023)  Tobacco Use: Medium Risk (12/08/2023)  Health Literacy: Adequate Health Literacy (01/05/2023)    Readmission Risk Interventions    12/08/2023    4:11 PM 06/04/2023    3:46 PM 04/24/2023    1:31 PM  Readmission Risk Prevention Plan  Transportation Screening Complete Complete Complete  HRI or Home Care Consult   Complete  Social  Work Consult for Recovery Care Planning/Counseling   Complete  Palliative Care Screening   Complete  Medication Review Oceanographer) Complete Complete Complete  PCP or Specialist appointment within 3-5 days of discharge Not Complete    HRI or Home Care Consult Complete Complete   SW Recovery Care/Counseling Consult Complete Complete   Palliative Care Screening Not Complete Not Applicable   Skilled Nursing Facility Complete Complete

## 2023-12-29 NOTE — Progress Notes (Signed)
 Initial Nutrition Assessment  DOCUMENTATION CODES:   Not applicable  INTERVENTION:  Magic cup TID with meals, each supplement provides 290 kcal and 9 grams of protein  NUTRITION DIAGNOSIS:  Inadequate oral intake related to lethargy/confusion as evidenced by other (comment) (NPO since admission, diet advanced to dysphagia 1 with thin liquids 12/22).  GOAL:  Patient will meet greater than or equal to 90% of their needs  MONITOR:  PO intake, Supplement acceptance  REASON FOR ASSESSMENT:  Rounds    ASSESSMENT:  89 yo male admitted with AMS, hypothermia, hypotension. PMH includes HTN, HLD, MGUS/chronic microcytic anemia, CKD stage IIIB, diet-controlled diabetes mellitus type 2, chronic thrombocytopenia, dementia, CHF, CAD, PAFib, BPH, hydrocelectomy.  Patient was not alert enough to take POs from admission until 12/22. Patient was receiving dextrose  containing IVF, which should mitigate refeeding risk now that diet has been advanced.  Noted ongoing poor prognosis. Plans for d/c to SNF when bed is available. S/P bedside swallow evaluation with SLP 12/22 and diet was advanced to dysphagia 1 with thin liquids. Meal intakes not recorded, but suspect intake will be suboptimal with waxing and waning alertness and dysphagia. RD to add magic cup supplements to meal trays.   Weights reviewed.  Admit: 79.7 kg (12/2) Most recent: 98.6 kg (12/16) Wt increase since adm r/t fluids.  Labs reviewed.  CBG: [phone]  Medications reviewed.  Diet Order:   Diet Order             DIET - DYS 1 Room service appropriate? Yes; Fluid consistency: Thin  Diet effective now                   EDUCATION NEEDS:  No education needs have been identified at this time  Skin:  Skin Assessment: Skin Integrity Issues: Skin Integrity Issues:: Stage II, Other (Comment) Stage II: buttocks Other: wound to vertebral column  Last BM:  12/22  Height:  Ht Readings from Last 1 Encounters:  12/20/23 5'  8 (1.727 m)   Weight:  Wt Readings from Last 1 Encounters:  12/22/23 98.6 kg   Ideal Body Weight:  70 kg  BMI:  Body mass index is 33.05 kg/m.  Estimated Nutritional Needs:  Kcal:  1800-2000 Protein:  80-100 gm Fluid:  1.8-2 L   Suzen HUNT RD, LDN, CNSC Contact via secure chat. If unavailable, use group chat RD Inpatient.

## 2023-12-30 DIAGNOSIS — A419 Sepsis, unspecified organism: Secondary | ICD-10-CM | POA: Diagnosis not present

## 2023-12-30 DIAGNOSIS — R6521 Severe sepsis with septic shock: Secondary | ICD-10-CM | POA: Diagnosis not present

## 2023-12-30 LAB — GLUCOSE, CAPILLARY
Glucose-Capillary: 102 mg/dL — ABNORMAL HIGH (ref 70–99)
Glucose-Capillary: 79 mg/dL (ref 70–99)
Glucose-Capillary: 89 mg/dL (ref 70–99)
Glucose-Capillary: 93 mg/dL (ref 70–99)

## 2023-12-30 NOTE — Progress Notes (Signed)
 " PROGRESS NOTE    Terrance Miller  FMW:[id number] DOB: 1928/04/17 DOA: 12/08/2023 PCP: [name], DO   Brief Narrative:      89 y.o. male with medical history significant for HTN, HLD, MGUS/chronic microcytic anemia, CKD stage IIIB, diet-controlled diabetes mellitus type 2, chronic thrombocytopenia, dementia, HFpEF/chronic diastolic dysfunction CHF,, h/o CAD, PAFib, h/o BPH with history of prior hydrocelectomy admitted on 12/08/2023 from SNF facility with altered mentation/acute metabolic encephalopathy, hypothermia and hypotension with concerns for possible infection   Patient was admitted from 5/28 to 6/2 and presented with similar presentation where he had hypothermia, bradycardia and hypotension as well as C. difficile colitis. Patient is now struggling with acute metabolic encephalopathy and appears to have poor recovery of mental status despite treatment of sepsis as well as persistent hypoglycemia.  He overall has poor prognosis, but appears now more alert and after SLP evaluation is tolerating a pured diet.  He is now awaiting SNF placement.  Assessment & Plan:   Principal Problem:   Septic shock (HCC) Active Problems:   Palliative care by specialist  Assessment and Plan:   1)SIRS ---SIRS pathophysiology has resolved Hypotension and Hypothermia resolved initially Off Levophed  Urine cx neg, chest x-ray without definite pneumonia ---completed at least 10 days of broad-spectrum antibiotics already-including Cefepime  and Vanco -Recurrent episodes of hypothermia---currently off warming blanket -A.m. cortisol on 12/08/2022 was 20.9 -Recent ACTH  stimulation test during prior admission was WNL ----- TSH  3.6 - UA not suggestive of UTI - Repeat blood cultures from 12/20/2023 NGTD - patient is more awake and more alert since discontinuing scopolamine  patch   2)Persistent Hypoglycemia--due to inability of patient to take oral intake- -per daughter at least for the last couple of  weeks prior to admission to the hospital while at SNF patient was unable to eat safely he was pocketing food and the daughter at times had to take the food out of his mouth while at SNF --No further hypoglycemia while on iv dextrose - palliative care consult from 12/21/23 appreciated - Discussed case with ethics -- Not a candidate for feeding tube due to advanced dementia --Reevaluated by speech pathologist and now with recommendations for dysphagia 1 diet given increased alertness -- 3)Acute on chronic macrocytic Anemia/MGUS/chronic thrombocytopenia-- -No obvious bleeding noted -There is been concern of MDS Versus other myeloproliferative disorder -Family previously deferred bone marrow biopsy - Hgb is up to around 8 from 6.9 after transfusion--, H&H may drop further due to I dextrose  iV fluids/hemodilution - Received 1 unit of PRBC on 12/18/2023--for total of 2 units of PRBC this admission -Platelets was down to 14 (at time of admission) - No overt bleeding - Continue avoiding heparin  products and use SCDs for DVT prophylaxis. - Most recent platelet count: > 100 K   4)Acute metabolic encephalopathy in the setting of SIRS--please see #1 above-now with persistent cognitive disorder with overall poor prognosis - -CT head without acute finding --Patient remains with very poor cognition -Excessive salivation improving with scopolamine  patch---May discontinue scopolamine  patch as this may be contributing to lethargy--May use Robinul  as needed excessive secretions -Mild elevation of serum ammonia and mild hypernatremia noted--not enough to explain extent of metabolic encephalopathy -patient is more awake and more alert since discontinuing scopolamine  patch   5)Acute kidney injury on chronic kidney disease 3B -Continue to monitor urine output -Creatinine currently less than 3 (creatinine peaked at 4.79 this admission) - Poor baseline functional status with underlying dementia and deemed not a  candidate for dialysis.  6)Chronic diastolic heart failure -04/22/23 Echo--EF 50-55%, no WMA, normal RVF  -Patient unable to take oral medications safely at this time - No further IV fluid as patient will be on dysphagia 1 diet   7)Mild hypokalemia - Repleted   8-Transaminitis - In the setting of shock of liver - Continue fluid resuscitation and follow LFTs intermittently.   9)H/o Coronary artery disease --s/p DES x2 to RCA 2004  No ACS type symptoms at this time -Restart statin when safe to give oral intake - May give aspirin  rectally for now until safe to give orally   10)history of vascular dementia--per daughter [name]--- patient's functional status declined prior to admission to the point of pocketing food and not being able to swallow and eat safely at SNF for the last couple of weeks prior to admission C/n supportive Rx -- 10)Ethics/social/failure to thrive - Goals of care and poor prognosis discussed with family members  -Further conversations with patient's daughter [name]---[phone] on 12/18/2023 --Patient remains DNR/DNI- I have discussed with patient's POA ([name]).   --- Patient is Not a candidate for PEG tube given underlying dementia - Insurance declined possible transfer to LTAC -- Family conference at bedside on 12/20/23 with patient's primary RN present, patient's biological daughter [name] present, patient's adopted [name] present,other family members including [name] and [name] present at bedside. -- Family at this time reported that they are not ready to transition to comfort care - - Family reiterates that they are Not ready to de-escalate treatment protocols at this time - Please see palliative care provider note dated 12/21/2023   11)Nutrition --inability to safely swallow as outlined above #10 -- Speech pathologist evaluated patient multiple times ---recommends n.p.o. status as patient unable to tolerate oral  intake -Patient now on dysphagia 1 diet - IV dextrose  as above    DVT prophylaxis: SCDs Code Status: DNR/DNI Family Communication: Discussed extensively with daughter on phone 12/22 Disposition Plan:  Status is: Inpatient Remains inpatient appropriate because: Need for ongoing IV fluids   Consultants:  Palliative care  Procedures:  None  Antimicrobials:  Anti-infectives (From admission, onward)    Start     Dose/Rate Route Frequency Ordered Stop   12/13/23 1200  vancomycin  (VANCOREADY) IVPB 750 mg/150 mL  Status:  Discontinued        750 mg 150 mL/hr over 60 Minutes Intravenous Every 48 hours 12/12/23 0925 12/12/23 1251   12/10/23 1200  vancomycin  (VANCOREADY) IVPB 750 mg/150 mL  Status:  Discontinued        750 mg 150 mL/hr over 60 Minutes Intravenous Every 48 hours 12/10/23 1022 12/12/23 0925   12/10/23 0500  ceFEPIme  (MAXIPIME ) 1 g in sodium chloride  0.9 % 100 mL IVPB        1 g 200 mL/hr over 30 Minutes Intravenous Every 24 hours 12/09/23 0753 12/17/23 0456   12/08/23 1327  vancomycin  variable dose per unstable renal function (pharmacist dosing)  Status:  Discontinued         Does not apply See admin instructions 12/08/23 1327 12/12/23 1251   12/08/23 0500  vancomycin  (VANCOREADY) IVPB 1500 mg/300 mL        1,500 mg 150 mL/hr over 120 Minutes Intravenous  Once 12/08/23 0459 12/08/23 0817   12/08/23 0500  ceFEPIme  (MAXIPIME ) 2 g in sodium chloride  0.9 % 100 mL IVPB  Status:  Discontinued        2 g 200 mL/hr over 30 Minutes Intravenous Every 24 hours  12/08/23 0500 12/09/23 0753       Subjective: Patient seen and evaluated today with no acute concerns noted overnight.  He seems more alert and awake.  He is awaiting SNF placement.  Objective: Vitals:   12/29/23 0551 12/29/23 1938 12/30/23 0505 12/30/23 1239  BP: (!) 148/86 (!) 140/75 (!) 155/93 (!) 147/81  Pulse: 64 (!) 58 66 67  Resp: 18 20 (!) 22   Temp: (!) 97.5 F (36.4 C) 98 F (36.7 C) (!) 97.3 F (36.3  C) (!) 97.5 F (36.4 C)  TempSrc: Oral Oral Oral Axillary  SpO2: 97% 95% 100% 97%  Weight:      Height:        Intake/Output Summary (Last 24 hours) at 12/30/2023 1314 Last data filed at 12/30/2023 0756 Gross per 24 hour  Intake 300 ml  Output 900 ml  Net -600 ml   Filed Weights   12/20/23 1512 12/20/23 1515 12/22/23 0628  Weight: 91.2 kg 91.2 kg 98.6 kg    Examination:  General exam: Appears calm and comfortable  Respiratory system: Clear to auscultation. Respiratory effort normal. Cardiovascular system: S1 & S2 heard, RRR.  Gastrointestinal system: Abdomen is soft Central nervous system: Arousable and makes eye contact to tactile stimulus Extremities: No edema Skin: No significant lesions noted    Data Reviewed: I have personally reviewed following labs and imaging studies  CBC: No results for input(s): WBC, NEUTROABS, HGB, HCT, MCV, PLT in the last 168 hours.  Basic Metabolic Panel: Recent Labs  Lab 12/25/23 0437 12/26/23 0407 12/27/23 0406 12/28/23 0447  NA 138 138 137 139  K 3.5 3.3* 3.5 3.6  CL 101 102 101 104  CO2 24 24 24 24   GLUCOSE 104* 100* 104* 106*  BUN 53* 51* 47* 43*  CREATININE 2.67* 2.67* 2.53* 2.33*  CALCIUM  8.8* 8.8* 8.8* 8.7*  MG  --  1.9 1.9 1.9   GFR: Estimated Creatinine Clearance: 22.6 mL/min (A) (by C-G formula based on SCr of 2.33 mg/dL (H)). Liver Function Tests: No results for input(s): AST, ALT, ALKPHOS, BILITOT, PROT, ALBUMIN in the last 168 hours.  No results for input(s): LIPASE, AMYLASE in the last 168 hours. No results for input(s): AMMONIA in the last 168 hours.  Coagulation Profile: No results for input(s): INR, PROTIME in the last 168 hours. Cardiac Enzymes: No results for input(s): CKTOTAL, CKMB, CKMBINDEX, TROPONINI in the last 168 hours. BNP (last 3 results) No results for input(s): PROBNP in the last 8760 hours. HbA1C: No results for input(s): HGBA1C in the last  72 hours. CBG: Recent Labs  Lab 12/29/23 1128 12/29/23 1833 12/29/23 2324 12/30/23 0504 12/30/23 1115  GLUCAP 85 86 88 79 93   Lipid Profile: No results for input(s): CHOL, HDL, LDLCALC, TRIG, CHOLHDL, LDLDIRECT in the last 72 hours. Thyroid  Function Tests: No results for input(s): TSH, T4TOTAL, FREET4, T3FREE, THYROIDAB in the last 72 hours. Anemia Panel: No results for input(s): VITAMINB12, FOLATE, FERRITIN, TIBC, IRON, RETICCTPCT in the last 72 hours. Sepsis Labs: No results for input(s): PROCALCITON, LATICACIDVEN in the last 168 hours.  Recent Results (from the past 240 hours)  Culture, blood (Routine X 2) w Reflex to ID Panel     Status: None   Collection Time: 12/20/23  2:31 PM   Specimen: Left Antecubital; Blood  Result Value Ref Range Status   Specimen Description   Final    LEFT ANTECUBITAL BOTTLES DRAWN AEROBIC AND ANAEROBIC   Special Requests Blood Culture adequate volume  Final  Culture   Final    NO GROWTH 5 DAYS Performed at [facility], [address], [place], [place] [address]    Report Status 12/25/2023 FINAL  Final  Culture, blood (Routine X 2) w Reflex to ID Panel     Status: None   Collection Time: 12/20/23  2:31 PM   Specimen: BLOOD LEFT HAND  Result Value Ref Range Status   Specimen Description   Final    BLOOD LEFT HAND BOTTLES DRAWN AEROBIC AND ANAEROBIC   Special Requests Blood Culture adequate volume  Final   Culture   Final    NO GROWTH 5 DAYS Performed at [facility], [address], [place], [place] [address]    Report Status 12/25/2023 FINAL  Final         Radiology Studies: No results found.      Scheduled Meds:  aspirin   300 mg Rectal Q T,Th,S,Su   Chlorhexidine  Gluconate Cloth  6 each Topical Daily   liver oil-zinc  oxide   Topical BID   mupirocin  ointment   Nasal BID   mouth rinse  15 mL Mouth Rinse 4 times per day      LOS: 22 days    Time spent: 35 minutes    Tanise Russman JONETTA Fairly, DO [facility]  If 7PM-7AM, please contact night-coverage [email or web address] 12/30/2023, 1:14 PM   "

## 2023-12-30 NOTE — Progress Notes (Signed)
 Speech Language Pathology Treatment: Dysphagia  Patient Details Name: Terrance Miller MRN: [id number] DOB: June 24, 1928 Today's Date: 12/30/2023 Time: 8464-8447 SLP Time Calculation (min) (ACUTE ONLY): 17 min  Assessment / Plan / Recommendation Clinical Impression  Continue Dysphagia 1/thin liquids with small sips with FULL supervision/A with meals when pt alert/responsive; consider palliative consult d/t limited satiety/intake of nutrition/hydration; ST will continue to f/u in acute setting. Terrance Miller was seen for dysphagia tx with mentation waxing/waning during tx session with speech only 50-75% intelligible and oral commands followed with max verbal/tactile cues provided.  Oral retention/holding, delayed swallow initiation and delayed cough noted during po intake.  Trialed soft solids d/t pt request and decreased mastication d/t limited dentition/fatigue observed and eventually solid was expelled from oral cavity via swab.  Multiple sub-swallows noted during session across consistencies, but more prevalent with puree/soft solids.    HPI HPI: 89 y.o. male with past medical history of dementia, HTN, HLD, CKDIV, DM2 admitted on 12/08/2023 with septic shock due to UTI- required pressors but they are off now, AKI. Mental status is recovering poorly. BSE requested and recommended NPO. ST f/u for diet tolerance/dysphagia tx with diet initiated of Dysphagia 1/thin.      SLP Plan  Continue with current plan of care        Swallow Evaluation Recommendations   Recommendations: PO diet PO Diet Recommendation: Dysphagia 1 (Pureed);Thin liquids (Level 0) Liquid Administration via: Cup;Spoon Medication Administration: Crushed with puree Supervision: Staff to assist with self-feeding;Full supervision/cueing for swallowing strategies Postural changes: Position pt fully upright for meals Oral care recommendations: Oral care BID (2x/day);Staff/trained caregiver to provide oral care Recommended consults:  Consider Palliative care Caregiver Recommendations: Have oral suction available     Recommendations   PO diet (Dysphagia 1(Puree)/thin liquids with FULL supervision/A                  Oral care BID;Staff/trained caregiver to provide oral care   Frequent or constant Supervision/Assistance Dysphagia, unspecified (R13.10)     Continue with current plan of care     Pat Darragh Nay,M.S.,CCC-SLP  12/30/2023, 4:13 PM

## 2023-12-30 NOTE — TOC Progression Note (Signed)
 Transition of Care [facility]) - Progression Note    Patient Details  Name: [name] MRN: [id number] Date of Birth: 26-Sep-1928  [facility] of [facility] CM/SW Contact  Noreen KATHEE [place], [place] Phone Number: 12/30/2023, 12:26 PM  Clinical Narrative:     CSW called around to pending facilities and at this time , no one has an open long-term bed available . [facility] also does not have an open bed. Avoidable days added.   Expected Discharge Plan: Long Term Nursing Home Barriers to Discharge: No SNF bed     Expected Discharge Plan and Services     Expected Discharge Date: 12/29/23                      Social Drivers of Health (SDOH) Interventions SDOH Screenings   Food Insecurity: No Food Insecurity (12/08/2023)  Housing: Low Risk (12/08/2023)  Transportation Needs: No Transportation Needs (12/08/2023)  Utilities: Not At Risk (12/08/2023)  Alcohol Screen: Low Risk (01/05/2023)  Depression (PHQ2-9): Medium Risk (03/17/2023)  Financial Resource Strain: Low Risk (01/05/2023)  Physical Activity: Inactive (01/05/2023)  Social Connections: Unknown (06/03/2023)  Recent Concern: Social Connections - Socially Isolated (05/04/2023)  Stress: No Stress Concern Present (01/05/2023)  Tobacco Use: Medium Risk (12/08/2023)  Health Literacy: Adequate Health Literacy (01/05/2023)    Readmission Risk Interventions    12/30/2023   12:26 PM 12/08/2023    4:11 PM 06/04/2023    3:46 PM  Readmission Risk Prevention Plan  Transportation Screening Complete Complete Complete  Medication Review Oceanographer) Complete Complete Complete  PCP or Specialist appointment within 3-5 days of discharge  Not Complete   HRI or Home Care Consult Complete Complete Complete  SW Recovery Care/Counseling Consult Complete Complete Complete  Palliative Care Screening Patient Refused Not Complete Not Applicable  Comments Family refused    Skilled Nursing Facility Complete Complete Complete

## 2023-12-30 NOTE — Plan of Care (Signed)
" °  Problem: Health Behavior/Discharge Planning: Goal: Ability to manage health-related needs will improve Outcome: Not Progressing   Problem: Clinical Measurements: Goal: Ability to maintain clinical measurements within normal limits will improve Outcome: Progressing   Problem: Nutrition: Goal: Adequate nutrition will be maintained Outcome: Progressing   "

## 2023-12-31 DIAGNOSIS — R6521 Severe sepsis with septic shock: Secondary | ICD-10-CM | POA: Diagnosis not present

## 2023-12-31 DIAGNOSIS — A419 Sepsis, unspecified organism: Secondary | ICD-10-CM | POA: Diagnosis not present

## 2023-12-31 LAB — GLUCOSE, CAPILLARY
Glucose-Capillary: 67 mg/dL — ABNORMAL LOW (ref 70–99)
Glucose-Capillary: 72 mg/dL (ref 70–99)
Glucose-Capillary: 76 mg/dL (ref 70–99)
Glucose-Capillary: 80 mg/dL (ref 70–99)

## 2023-12-31 NOTE — Plan of Care (Signed)
  Problem: Health Behavior/Discharge Planning: Goal: Ability to manage health-related needs will improve Outcome: Progressing   Problem: Clinical Measurements: Goal: Ability to maintain clinical measurements within normal limits will improve Outcome: Progressing   Problem: Nutrition: Goal: Adequate nutrition will be maintained Outcome: Progressing   

## 2023-12-31 NOTE — Plan of Care (Signed)
   Problem: Health Behavior/Discharge Planning: Goal: Ability to manage health-related needs will improve Outcome: Progressing   Problem: Clinical Measurements: Goal: Ability to maintain clinical measurements within normal limits will improve Outcome: Progressing

## 2023-12-31 NOTE — Plan of Care (Signed)
   Problem: Nutrition: Goal: Adequate nutrition will be maintained Outcome: Not Progressing

## 2023-12-31 NOTE — Progress Notes (Signed)
 " PROGRESS NOTE    Terrance Miller  FMW:[id number] DOB: 07-06-1928 DOA: 12/08/2023 PCP: [name], DO   Brief Narrative:      89 y.o. male with medical history significant for HTN, HLD, MGUS/chronic microcytic anemia, CKD stage IIIB, diet-controlled diabetes mellitus type 2, chronic thrombocytopenia, dementia, HFpEF/chronic diastolic dysfunction CHF,, h/o CAD, PAFib, h/o BPH with history of prior hydrocelectomy admitted on 12/08/2023 from SNF facility with altered mentation/acute metabolic encephalopathy, hypothermia and hypotension with concerns for possible infection   Patient was admitted from 5/28 to 6/2 and presented with similar presentation where he had hypothermia, bradycardia and hypotension as well as C. difficile colitis. Patient is now struggling with acute metabolic encephalopathy and appears to have poor recovery of mental status despite treatment of sepsis as well as persistent hypoglycemia.  He overall has poor prognosis, but appears now more alert and after SLP evaluation is tolerating a pured diet.  He is now awaiting SNF placement.  Assessment & Plan:   Principal Problem:   Septic shock (HCC) Active Problems:   Palliative care by specialist  Assessment and Plan:   1)SIRS ---SIRS pathophysiology has resolved Hypotension and Hypothermia resolved initially Off Levophed  Urine cx neg, chest x-ray without definite pneumonia ---completed at least 10 days of broad-spectrum antibiotics already-including Cefepime  and Vanco -Recurrent episodes of hypothermia---currently off warming blanket -A.m. cortisol on 12/08/2022 was 20.9 -Recent ACTH  stimulation test during prior admission was WNL ----- TSH  3.6 - UA not suggestive of UTI - Repeat blood cultures from 12/20/2023 NGTD - patient is more awake and more alert since discontinuing scopolamine  patch   2)Persistent Hypoglycemia--due to inability of patient to take oral intake- -per daughter at least for the last couple of  weeks prior to admission to the hospital while at SNF patient was unable to eat safely he was pocketing food and the daughter at times had to take the food out of his mouth while at SNF --No further hypoglycemia while on iv dextrose - palliative care consult from 12/21/23 appreciated - Discussed case with ethics -- Not a candidate for feeding tube due to advanced dementia --Reevaluated by speech pathologist and now with recommendations for dysphagia 1 diet given increased alertness -- 3)Acute on chronic macrocytic Anemia/MGUS/chronic thrombocytopenia-- -No obvious bleeding noted -There is been concern of MDS Versus other myeloproliferative disorder -Family previously deferred bone marrow biopsy - Hgb is up to around 8 from 6.9 after transfusion--, H&H may drop further due to I dextrose  iV fluids/hemodilution - Received 1 unit of PRBC on 12/18/2023--for total of 2 units of PRBC this admission -Platelets was down to 14 (at time of admission) - No overt bleeding - Continue avoiding heparin  products and use SCDs for DVT prophylaxis. - Most recent platelet count: > 100 K   4)Acute metabolic encephalopathy in the setting of SIRS--please see #1 above-now with persistent cognitive disorder with overall poor prognosis - -CT head without acute finding --Patient remains with very poor cognition -Excessive salivation improving with scopolamine  patch---May discontinue scopolamine  patch as this may be contributing to lethargy--May use Robinul  as needed excessive secretions -Mild elevation of serum ammonia and mild hypernatremia noted--not enough to explain extent of metabolic encephalopathy -patient is more awake and more alert since discontinuing scopolamine  patch   5)Acute kidney injury on chronic kidney disease 3B -Continue to monitor urine output -Creatinine currently less than 3 (creatinine peaked at 4.79 this admission) - Poor baseline functional status with underlying dementia and deemed not a  candidate for dialysis.  6)Chronic diastolic heart failure -04/22/23 Echo--EF 50-55%, no WMA, normal RVF  -Patient unable to take oral medications safely at this time - No further IV fluid as patient will be on dysphagia 1 diet   7)Mild hypokalemia - Repleted   8-Transaminitis - In the setting of shock of liver - Continue fluid resuscitation and follow LFTs intermittently.   9)H/o Coronary artery disease --s/p DES x2 to RCA 2004  No ACS type symptoms at this time -Restart statin when safe to give oral intake - May give aspirin  rectally for now until safe to give orally   10)history of vascular dementia--per daughter [name]--- patient's functional status declined prior to admission to the point of pocketing food and not being able to swallow and eat safely at SNF for the last couple of weeks prior to admission C/n supportive Rx -- 10)Ethics/social/failure to thrive - Goals of care and poor prognosis discussed with family members  -Further conversations with patient's daughter [name]---[phone] on 12/18/2023 --Patient remains DNR/DNI- I have discussed with patient's POA ([name]).   --- Patient is Not a candidate for PEG tube given underlying dementia - Insurance declined possible transfer to LTAC -- Family conference at bedside on 12/20/23 with patient's primary RN present, patient's biological daughter [name] present, patient's adopted [name] present,other family members including [name] and [name] present at bedside. -- Family at this time reported that they are not ready to transition to comfort care - - Family reiterates that they are Not ready to de-escalate treatment protocols at this time - Please see palliative care provider note dated 12/21/2023   11)Nutrition --inability to safely swallow as outlined above #10 -- Speech pathologist evaluated patient multiple times ---recommends n.p.o. status as patient unable to tolerate oral  intake -Patient now on dysphagia 1 diet - IV dextrose  as above    DVT prophylaxis: SCDs Code Status: DNR/DNI Family Communication: Discussed extensively with daughter on phone 12/22 Disposition Plan: Currently awaiting SNF placement Status is: Inpatient Remains inpatient appropriate because: Need for ongoing IV fluids   Consultants:  Palliative care  Procedures:  None  Antimicrobials:  Anti-infectives (From admission, onward)    Start     Dose/Rate Route Frequency Ordered Stop   12/13/23 1200  vancomycin  (VANCOREADY) IVPB 750 mg/150 mL  Status:  Discontinued        750 mg 150 mL/hr over 60 Minutes Intravenous Every 48 hours 12/12/23 0925 12/12/23 1251   12/10/23 1200  vancomycin  (VANCOREADY) IVPB 750 mg/150 mL  Status:  Discontinued        750 mg 150 mL/hr over 60 Minutes Intravenous Every 48 hours 12/10/23 1022 12/12/23 0925   12/10/23 0500  ceFEPIme  (MAXIPIME ) 1 g in sodium chloride  0.9 % 100 mL IVPB        1 g 200 mL/hr over 30 Minutes Intravenous Every 24 hours 12/09/23 0753 12/17/23 0456   12/08/23 1327  vancomycin  variable dose per unstable renal function (pharmacist dosing)  Status:  Discontinued         Does not apply See admin instructions 12/08/23 1327 12/12/23 1251   12/08/23 0500  vancomycin  (VANCOREADY) IVPB 1500 mg/300 mL        1,500 mg 150 mL/hr over 120 Minutes Intravenous  Once 12/08/23 0459 12/08/23 0817   12/08/23 0500  ceFEPIme  (MAXIPIME ) 2 g in sodium chloride  0.9 % 100 mL IVPB  Status:  Discontinued        2 g 200 mL/hr over 30 Minutes Intravenous  Every 24 hours 12/08/23 0500 12/09/23 0753       Subjective: Patient seen and evaluated today with no acute concerns noted overnight.  He has been tolerating diet and is overall more alert and awake.  He is awaiting SNF placement.  Objective: Vitals:   12/30/23 1239 12/30/23 2004 12/31/23 0608 12/31/23 1341  BP: (!) 147/81 (!) 146/87 137/83 (!) 152/87  Pulse: 67 64 61 68  Resp:  20 18 18   Temp: (!)  97.5 F (36.4 C) (!) 97.5 F (36.4 C) 97.6 F (36.4 C) 98.7 F (37.1 C)  TempSrc: Axillary Axillary Oral Axillary  SpO2: 97% 97% 99% 98%  Weight:      Height:        Intake/Output Summary (Last 24 hours) at 12/31/2023 1412 Last data filed at 12/31/2023 0912 Gross per 24 hour  Intake 240 ml  Output 50 ml  Net 190 ml   Filed Weights   12/20/23 1512 12/20/23 1515 12/22/23 0628  Weight: 91.2 kg 91.2 kg 98.6 kg    Examination:  General exam: Appears calm and comfortable  Respiratory system: Clear to auscultation. Respiratory effort normal. Cardiovascular system: S1 & S2 heard, RRR.  Gastrointestinal system: Abdomen is soft Central nervous system: Arousable and makes eye contact to tactile stimulus Extremities: No edema Skin: No significant lesions noted    Data Reviewed: I have personally reviewed following labs and imaging studies  CBC: No results for input(s): WBC, NEUTROABS, HGB, HCT, MCV, PLT in the last 168 hours.  Basic Metabolic Panel: Recent Labs  Lab 12/25/23 0437 12/26/23 0407 12/27/23 0406 12/28/23 0447  NA 138 138 137 139  K 3.5 3.3* 3.5 3.6  CL 101 102 101 104  CO2 24 24 24 24   GLUCOSE 104* 100* 104* 106*  BUN 53* 51* 47* 43*  CREATININE 2.67* 2.67* 2.53* 2.33*  CALCIUM  8.8* 8.8* 8.8* 8.7*  MG  --  1.9 1.9 1.9   GFR: Estimated Creatinine Clearance: 22.6 mL/min (A) (by C-G formula based on SCr of 2.33 mg/dL (H)). Liver Function Tests: No results for input(s): AST, ALT, ALKPHOS, BILITOT, PROT, ALBUMIN in the last 168 hours.  No results for input(s): LIPASE, AMYLASE in the last 168 hours. No results for input(s): AMMONIA in the last 168 hours.  Coagulation Profile: No results for input(s): INR, PROTIME in the last 168 hours. Cardiac Enzymes: No results for input(s): CKTOTAL, CKMB, CKMBINDEX, TROPONINI in the last 168 hours. BNP (last 3 results) No results for input(s): PROBNP in the last 8760  hours. HbA1C: No results for input(s): HGBA1C in the last 72 hours. CBG: Recent Labs  Lab 12/30/23 1115 12/30/23 1844 12/30/23 2333 12/31/23 0606 12/31/23 1122  GLUCAP 93 102* 89 72 80   Lipid Profile: No results for input(s): CHOL, HDL, LDLCALC, TRIG, CHOLHDL, LDLDIRECT in the last 72 hours. Thyroid  Function Tests: No results for input(s): TSH, T4TOTAL, FREET4, T3FREE, THYROIDAB in the last 72 hours. Anemia Panel: No results for input(s): VITAMINB12, FOLATE, FERRITIN, TIBC, IRON, RETICCTPCT in the last 72 hours. Sepsis Labs: No results for input(s): PROCALCITON, LATICACIDVEN in the last 168 hours.  No results found for this or any previous visit (from the past 240 hours).        Radiology Studies: No results found.      Scheduled Meds:  aspirin   300 mg Rectal Q T,Th,S,Su   Chlorhexidine  Gluconate Cloth  6 each Topical Daily   liver oil-zinc  oxide   Topical BID   mupirocin  ointment  Nasal BID   mouth rinse  15 mL Mouth Rinse 4 times per day      LOS: 23 days    Time spent: 35 minutes    Ellison Rieth JONETTA Fairly, DO [facility]  If 7PM-7AM, please contact night-coverage [email or web address] 12/31/2023, 2:12 PM   "

## 2023-12-31 NOTE — TOC Progression Note (Signed)
 Transition of Care [facility]) - Progression Note    Patient Details  Name: Terrance Miller MRN: [id number] Date of Birth: [birth year]  [facility] of [facility]) CM/SW Contact  [name], LCSW Phone Number: 12/31/2023, 8:41 AM  Clinical Narrative:    CV reports they still do not have a bed for pt to return to today.    Expected Discharge Plan: Long Term Nursing Home Barriers to Discharge: No SNF bed               Expected Discharge Plan and Services         Expected Discharge Date: 12/29/23                                     Social Drivers of Health (SDOH) Interventions SDOH Screenings   Food Insecurity: No Food Insecurity (12/08/2023)  Housing: Low Risk (12/08/2023)  Transportation Needs: No Transportation Needs (12/08/2023)  Utilities: Not At Risk (12/08/2023)  Alcohol Screen: Low Risk (01/05/2023)  Depression (PHQ2-9): Medium Risk (03/17/2023)  Financial Resource Strain: Low Risk (01/05/2023)  Physical Activity: Inactive (01/05/2023)  Social Connections: Unknown (06/03/2023)  Recent Concern: Social Connections - Socially Isolated (05/04/2023)  Stress: No Stress Concern Present (01/05/2023)  Tobacco Use: Medium Risk (12/08/2023)  Health Literacy: Adequate Health Literacy (01/05/2023)    Readmission Risk Interventions    12/30/2023   12:26 PM 12/08/2023    4:11 PM 06/04/2023    3:46 PM  Readmission Risk Prevention Plan  Transportation Screening Complete Complete Complete  Medication Review Oceanographer) Complete Complete Complete  PCP or Specialist appointment within 3-5 days of discharge  Not Complete   HRI or Home Care Consult Complete Complete Complete  SW Recovery Care/Counseling Consult Complete Complete Complete  Palliative Care Screening Patient Refused Not Complete Not Applicable  Comments Family refused    Skilled Nursing Facility Complete Complete Complete

## 2024-01-01 DIAGNOSIS — A419 Sepsis, unspecified organism: Secondary | ICD-10-CM | POA: Diagnosis not present

## 2024-01-01 DIAGNOSIS — R6521 Severe sepsis with septic shock: Secondary | ICD-10-CM | POA: Diagnosis not present

## 2024-01-01 LAB — GLUCOSE, CAPILLARY
Glucose-Capillary: 121 mg/dL — ABNORMAL HIGH (ref 70–99)
Glucose-Capillary: 127 mg/dL — ABNORMAL HIGH (ref 70–99)
Glucose-Capillary: 85 mg/dL (ref 70–99)
Glucose-Capillary: 88 mg/dL (ref 70–99)
Glucose-Capillary: 90 mg/dL (ref 70–99)

## 2024-01-01 MED ORDER — DEXTROSE 50 % IV SOLN
12.5000 g | INTRAVENOUS | Status: AC
  Administered 2024-01-01: 12.5 g via INTRAVENOUS
  Filled 2024-01-01: qty 50

## 2024-01-01 NOTE — Progress Notes (Signed)
 SLP Cancellation Note  Patient Details Name: Terrance Miller MRN: [id number] DOB: 08-Dec-1928   Cancelled treatment:       Reason Eval/Treat Not Completed: Fatigue/lethargy limiting ability to participate. Pt briefly opened his eyes and verbally responded but was unable to maintain alertness. Echo recommendation from most recent ST tx: Continue Dysphagia 1/thin liquids with small sips with FULL supervision/A with meals when pt alert/responsive; ST will continue to f/u in acute setting.   [name] H. Clois KILLIAN, CCC-SLP Speech Language Pathologist  [name] 01/01/2024, 2:31 PM

## 2024-01-01 NOTE — Plan of Care (Signed)
   Problem: Education: Goal: Knowledge of General Education information will improve Description: Including pain rating scale, medication(s)/side effects and non-pharmacologic comfort measures Outcome: Not Progressing   Problem: Health Behavior/Discharge Planning: Goal: Ability to manage health-related needs will improve Outcome: Not Progressing

## 2024-01-01 NOTE — Plan of Care (Addendum)
" ° ° ° °  Referral previously received for Terrance Miller for goals of care discussion. Noted most recent palliative in-person assessment dated 12/21/2023 at which time it was recommended to follow from a distance to support TRH and not engage with family, per their request.  Chart reviewed for Recent provider notes, TOC notes, vitals, and labs.   At this time patient appears stable for d/c, discharge summary entered. Patient's family has also continued to decline palliative and/or hospice involvement. Planned d/c back to LTC facility when a bed is available and, thus far, no available bed. No plan for in person follow-up and will sign off at this time as this is now a disposition issue and TOC is actively engaged. Please contact the palliative medicine provider on service for any new/urgent needs that require our assistance with this patient.  Thank you for your referral and allowing PMT to assist in [facility] care.   Camellia Kays, NP Palliative Medicine Team Phone: [phone]  NO CHARGE  "

## 2024-01-01 NOTE — Progress Notes (Signed)
" °   01/01/24 1432  Assess: MEWS Score  Temp 97.8 F (36.6 C)  BP (!) 160/94  MAP (mmHg) 114  Pulse Rate 66  Resp (!) 24  SpO2 100 %  O2 Device Room Air  Assess: MEWS Score  MEWS Temp 0  MEWS Systolic 0  MEWS Pulse 0  MEWS RR 1  MEWS LOC 1  MEWS Score 2  MEWS Score Color Yellow  Assess: if the MEWS score is Yellow or Red  Were vital signs accurate and taken at a resting state? Yes  Does the patient meet 2 or more of the SIRS criteria? No  MEWS guidelines implemented  Yes, yellow  Treat  MEWS Interventions Considered administering scheduled or prn medications/treatments as ordered  Take Vital Signs  Increase Vital Sign Frequency  Yellow: Q2hr x1, continue Q4hrs until patient remains green for 12hrs  Escalate  MEWS: Escalate Yellow: Discuss with charge nurse and consider notifying provider and/or RRT  Notify: Charge Nurse/RN  Name of Charge Nurse/RN Notified Rayfield Debby PEAK  Provider Notification  Provider Name/Title Dr. Rendall  Date Provider Notified 01/01/24  Time Provider Notified 1441  Method of Notification Page  Notification Reason Other (Comment) (yellow mews)  Provider response No new orders  Assess: SIRS CRITERIA  SIRS Temperature  0  SIRS Respirations  1  SIRS Pulse 0  SIRS WBC 0  SIRS Score Sum  1    "

## 2024-01-01 NOTE — Progress Notes (Signed)
 Physical Therapy Treatment Patient Details Name: Terrance Miller MRN: [id number] DOB: [birth year] Today's Date: 01/01/2024   History of Present Illness Terrance Miller is a 89 y.o. male with medical history significant of hypertension, hyperlipidemia, MGUS, CKD stage III, diet-controlled diabetes mellitus type 2, thrombocytopenia, dementia, HFpEF, coronary disease who presents to the emergency department via EMS due to altered mental status.  She was noted with altered mental status for the SNF yesterday in the evening with heart rate in 30s and patient was hypotensive with SBP in the 70s.    PT Comments  Patient demonstrates slow labored movement for rolling to side and sitting up at bedside having to use bed rail and Mod/max assist due to weakness. Patient able to maintain sitting balance at bedside, unable to lift legs against gravity requiring active assistance to move legs, tolerated standing and taking a few shuffling side steps at bedside, stood for up to 3-4 minutes before having to sit due to fatigue.  Patient required Max assist to reposition when put back to bed. Patient will benefit from continued skilled physical therapy in hospital and recommended venue below to increase strength, balance, endurance for safe ADLs and gait.      If plan is discharge home, recommend the following: A lot of help with bathing/dressing/bathroom;A lot of help with walking and/or transfers;Help with stairs or ramp for entrance;Assist for transportation;Assistance with cooking/housework   Can travel by private vehicle     No  Equipment Recommendations  None recommended by PT    Recommendations for Other Services       Precautions / Restrictions Precautions Precautions: Fall Recall of Precautions/Restrictions: Impaired Restrictions Weight Bearing Restrictions Per Provider Order: No     Mobility  Bed Mobility Overal bed mobility: Needs Assistance Bed Mobility: Rolling, Sidelying to Sit, Sit to Supine,  Sit to Sidelying Rolling: Mod assist, Max assist Sidelying to sit: Mod assist, Max assist     Sit to sidelying: Mod assist, Max assist General bed mobility comments: slow labored movement with difficulty moving legs due to weakness    Transfers Overall transfer level: Needs assistance Equipment used: Rolling walker (2 wheels) Transfers: Sit to/from Stand Sit to Stand: Mod assist                Ambulation/Gait Ambulation/Gait assistance: Max assist Gait Distance (Feet): 5 Feet Assistive device: Rolling walker (2 wheels) Gait Pattern/deviations: Decreased step length - right, Decreased step length - left, Decreased stride length, Shuffle, Knees buckling Gait velocity: slow     General Gait Details: limited to a few slow labored unsteady side steps before having to sit due to fatigue, weakness   Stairs             Wheelchair Mobility     Tilt Bed    Modified Rankin (Stroke Patients Only)       Balance Overall balance assessment: Needs assistance Sitting-balance support: Feet supported, No upper extremity supported Sitting balance-Leahy Scale: Poor Sitting balance - Comments: fair/poor seated at EOB   Standing balance support: Reliant on assistive device for balance, During functional activity, Bilateral upper extremity supported Standing balance-Leahy Scale: Poor Standing balance comment: using RW                            Communication Communication Communication: Impaired Factors Affecting Communication: Difficulty expressing self  Cognition Arousal: Alert Behavior During Therapy: Flat affect, WFL for tasks assessed/performed  Following commands: Impaired Following commands impaired: Follows one step commands with increased time    Cueing Cueing Techniques: Verbal cues, Tactile cues  Exercises General Exercises - Lower Extremity Long Arc Quad: Seated, AAROM, Strengthening, Both, 10 reps     General Comments        Pertinent Vitals/Pain Pain Assessment Pain Assessment: Faces Faces Pain Scale: Hurts little more Pain Location: over scrotal area during side lying to sitting Pain Descriptors / Indicators: Discomfort, Sore, Grimacing Pain Intervention(s): Limited activity within patient's tolerance, Monitored during session, Repositioned    Home Living                          Prior Function            PT Goals (current goals can now be found in the care plan section) Acute Rehab PT Goals Patient Stated Goal: return home PT Goal Formulation: With patient Time For Goal Achievement: 01/06/24 Potential to Achieve Goals: Good Progress towards PT goals: Progressing toward goals    Frequency    Min 3X/week      PT Plan      Co-evaluation              AM-PAC PT 6 Clicks Mobility   Outcome Measure  Help needed turning from your back to your side while in a flat bed without using bedrails?: A Lot Help needed moving from lying on your back to sitting on the side of a flat bed without using bedrails?: A Lot Help needed moving to and from a bed to a chair (including a wheelchair)?: A Lot Help needed standing up from a chair using your arms (e.g., wheelchair or bedside chair)?: A Lot Help needed to walk in hospital room?: A Lot Help needed climbing 3-5 steps with a railing? : Total 6 Click Score: 11    End of Session   Activity Tolerance: Patient tolerated treatment well;Patient limited by fatigue Patient left: in bed;with call bell/phone within reach Nurse Communication: Mobility status PT Visit Diagnosis: Unsteadiness on feet (R26.81);Other abnormalities of gait and mobility (R26.89);Muscle weakness (generalized) (M62.81)     Time: 8497-8468 PT Time Calculation (min) (ACUTE ONLY): 29 min  Charges:    $Therapeutic Activity: 23-37 mins PT General Charges $$ ACUTE PT VISIT: 1 Visit                     3:44 PM, 01/01/2024 Lynwood Music, MPT Physical Therapist with [facility] [phone] office [phone] mobile phone

## 2024-01-01 NOTE — Progress Notes (Signed)
 " PROGRESS NOTE  Terrance Miller  FMW:[id number] DOB: [birth year] DOA: 12/08/2023 PCP: [name], DO   Brief Narrative:   89 y.o. male with medical history significant for HTN, HLD, MGUS/chronic microcytic anemia, CKD stage IIIB, diet-controlled diabetes mellitus type 2, chronic thrombocytopenia, dementia, HFpEF/chronic diastolic dysfunction CHF,, h/o CAD, PAFib, h/o BPH with history of prior hydrocelectomy admitted on 12/08/2023 from SNF facility with altered mentation/acute metabolic encephalopathy, hypothermia and hypotension with concerns for possible infection   Patient was admitted from 5/28 to 6/2 and presented with similar presentation where he had hypothermia, bradycardia and hypotension as well as C. difficile colitis. Patient is now struggling with acute metabolic encephalopathy and appears to have poor recovery of mental status despite treatment of sepsis as well as persistent hypoglycemia.  He overall has poor prognosis, but appears now more alert and after SLP evaluation is tolerating a pured diet.  He is now awaiting SNF placement.  Assessment & Plan:   Principal Problem:   Septic shock (HCC) Active Problems:   Palliative care by specialist  Assessment and Plan:  1)SIRS ---SIRS pathophysiology has resolved Hypotension and Hypothermia resolved initially Off Levophed  Urine cx neg, chest x-ray without definite pneumonia ---completed at least 10 days of broad-spectrum antibiotics already-including Cefepime  and Vanco -Recurrent episodes of hypothermia---currently off warming blanket -A.m. cortisol on 12/08/2022 was 20.9 -Recent ACTH  stimulation test during prior admission was WNL ----- TSH  3.6 - UA not suggestive of UTI - Repeat blood cultures from 12/20/2023 NGTD - patient is more awake and more alert since discontinuing scopolamine  patch   2)Persistent Hypoglycemia--due to inability of patient to take oral intake- -per daughter at least for the last couple of weeks prior  to admission to the hospital while at SNF patient was unable to eat safely he was pocketing food and the daughter at times had to take the food out of his mouth while at SNF --No further hypoglycemia while on iv dextrose - palliative care consult from 12/21/23 appreciated - Dr. Adron Fairly Discussed case with ethics -- Not a candidate for feeding tube due to advanced dementia --Reevaluated by speech pathologist and now with recommendations for Dysphagia 1/thin liquids with small sips with FULL supervision/A with meals when pt alert/responsive  -- 3)Acute on chronic macrocytic Anemia/MGUS/chronic thrombocytopenia-- -No obvious bleeding noted -There is been concern of MDS Versus other myeloproliferative disorder -Family previously deferred bone marrow biopsy - Hgb is up to around 8 from 6.9 after transfusion--, H&H may drop further due to I dextrose  iV fluids/hemodilution - Received 1 unit of PRBC on 12/18/2023--for total of 2 units of PRBC this admission -Platelets was down to 14 (at time of admission) - No overt bleeding - Continue avoiding heparin  products and use SCDs for DVT prophylaxis. - Most recent platelet count: > 100 K   4)Acute metabolic encephalopathy in the setting of SIRS--please see #1 above-now with persistent cognitive disorder with overall poor prognosis - -CT head without acute finding --Patient remains with very poor cognition -Excessive salivation improving with scopolamine  patch---May discontinue scopolamine  patch as this may be contributing to lethargy--May use Robinul  as needed excessive secretions -Mild elevation of serum ammonia and mild hypernatremia noted--not enough to explain extent of metabolic encephalopathy -patient is more awake and more alert since discontinuing scopolamine  patch   5)Acute kidney injury on chronic kidney disease 3B -Continue to monitor urine output -Creatinine currently less than 3 (creatinine peaked at 4.79 this admission) - Poor baseline  functional status with underlying dementia and deemed not  a candidate for dialysis.   6)Chronic diastolic heart failure -04/22/23 Echo--EF 50-55%, no WMA, normal RVF  -Patient unable to take oral medications safely at this time - No further IV fluid as patient will be on dysphagia 1 diet   7)Mild hypokalemia - Repleted   8-Transaminitis - In the setting of shock of liver - LFTs much improved overall    9)H/o Coronary artery disease --s/p DES x2 to RCA 2004  No ACS type symptoms at this time -Restart statin when safe to give oral intake - May give aspirin  rectally for now until safe to give orally   10)history of vascular dementia--per daughter Terrance--- patient's functional status declined prior to admission to the point of pocketing food and not being able to swallow and eat safely at SNF for the last couple of weeks prior to admission C/n supportive Rx -- 10)Ethics/social/failure to thrive - Goals of care and poor prognosis discussed with family members  - Primary contact is patient's daughter Ms Terrance Miller---[phone]  --Patient remains DNR/DNI- I have discussed with patient's POA (Ms. Terrance Miller).   --- Patient is Not a candidate for PEG tube given underlying dementia - Insurance declined possible transfer to LTAC -- Family conference at bedside previously had with patient's biological daughter Terrance Miller present, patient's adopted Terrance Miller present,other family members including Terrance Miller and Terrance Miller present at bedside. -- Family at this time reported that they are not ready to transition to comfort care - - Family reiterates that they are Not ready to de-escalate treatment protocols at this time - Please see palliative care provider note dated 12/21/2023   11)Nutrition --inability to safely swallow as outlined above #10 -- Speech pathologist evaluated patient multiple times --speech pathology recommends- Continue Dysphagia 1/thin liquids with small sips with FULL  supervision/A with meals when pt alert/responsive    DVT prophylaxis: SCDs Code Status: DNR/DNI Family Communication: Discussed extensively with daughter previously  Disposition Plan: Currently awaiting SNF placement Remains inpatient appropriate because: Need for ongoing IV fluids  Consultants:  Palliative care  Antimicrobials:  Anti-infectives (From admission, onward)    Start     Dose/Rate Route Frequency Ordered Stop   12/13/23 1200  vancomycin  (VANCOREADY) IVPB 750 mg/150 mL  Status:  Discontinued        750 mg 150 mL/hr over 60 Minutes Intravenous Every 48 hours 12/12/23 0925 12/12/23 1251   12/10/23 1200  vancomycin  (VANCOREADY) IVPB 750 mg/150 mL  Status:  Discontinued        750 mg 150 mL/hr over 60 Minutes Intravenous Every 48 hours 12/10/23 1022 12/12/23 0925   12/10/23 0500  ceFEPIme  (MAXIPIME ) 1 g in sodium chloride  0.9 % 100 mL IVPB        1 g 200 mL/hr over 30 Minutes Intravenous Every 24 hours 12/09/23 0753 12/17/23 0456   12/08/23 1327  vancomycin  variable dose per unstable renal function (pharmacist dosing)  Status:  Discontinued         Does not apply See admin instructions 12/08/23 1327 12/12/23 1251   12/08/23 0500  vancomycin  (VANCOREADY) IVPB 1500 mg/300 mL        1,500 mg 150 mL/hr over 120 Minutes Intravenous  Once 12/08/23 0459 12/08/23 0817   12/08/23 0500  ceFEPIme  (MAXIPIME ) 2 g in sodium chloride  0.9 % 100 mL IVPB  Status:  Discontinued        2 g 200 mL/hr over 30 Minutes Intravenous Every 24 hours 12/08/23 0500 12/09/23 0753  Subjective: - Despite attempt to feed patient by staff oral intake is not great--blood sugars noted  Objective: Vitals:   12/31/23 1341 12/31/23 1955 01/01/24 0530 01/01/24 1432  BP: (!) 152/87 (!) 153/79 (!) 151/93 (!) 160/94  Pulse: 68 65 65 66  Resp: 18 20 18  (!) 24  Temp: 98.7 F (37.1 C) 97.6 F (36.4 C) (!) 97 F (36.1 C) 97.8 F (36.6 C)  TempSrc: Axillary Oral Axillary Oral  SpO2: 98% 97% 97% 100%   Weight:      Height:        Intake/Output Summary (Last 24 hours) at 01/01/2024 1807 Last data filed at 01/01/2024 1159 Gross per 24 hour  Intake 125 ml  Output 400 ml  Net -275 ml   Filed Weights   12/20/23 1512 12/20/23 1515 12/22/23 0628  Weight: 91.2 kg 91.2 kg 98.6 kg   Physical Exam Gen:-Patient is more awake Alert, in no acute distress  HEENT:- [place].AT, No sclera icterus Neck-Supple Neck,No JVD,.  Lungs-  CTAB , fair air movement bilaterally  CV- S1, S2 normal, RRR Abd-  +ve B.Sounds, Abd Soft, No tenderness,    Extremity/Skin:-Right upper extremity edema improving,   good pedal pulses Psych-affect is flat,, patient with cognitive and memory deficits,, patient is cooperative and able to answer simple questions with 1-2 word answers Neuro-generalized weakness, no new focal deficits, no tremors  Basic Metabolic Panel: Recent Labs  Lab 12/26/23 0407 12/27/23 0406 12/28/23 0447  NA 138 137 139  K 3.3* 3.5 3.6  CL 102 101 104  CO2 24 24 24   GLUCOSE 100* 104* 106*  BUN 51* 47* 43*  CREATININE 2.67* 2.53* 2.33*  CALCIUM  8.8* 8.8* 8.7*  MG 1.9 1.9 1.9   GFR: Estimated Creatinine Clearance: 22.6 mL/min (A) (by C-G formula based on SCr of 2.33 mg/dL (H)).  CBG: Recent Labs  Lab 12/31/23 2358 01/01/24 0045 01/01/24 0528 01/01/24 1136 01/01/24 1651  GLUCAP 67* 90 85 88 121*   Scheduled Meds:  aspirin   300 mg Rectal Q T,Th,S,Su   Chlorhexidine  Gluconate Cloth  6 each Topical Daily   liver oil-zinc  oxide   Topical BID   mupirocin  ointment   Nasal BID   mouth rinse  15 mL Mouth Rinse 4 times per day     LOS: 24 days    Rendall Carwin, MD [facility]  If 7PM-7AM, please contact night-coverage [email or web address] 01/01/2024, 6:07 PM   "

## 2024-01-02 DIAGNOSIS — R6521 Severe sepsis with septic shock: Secondary | ICD-10-CM | POA: Diagnosis not present

## 2024-01-02 DIAGNOSIS — A419 Sepsis, unspecified organism: Secondary | ICD-10-CM | POA: Diagnosis not present

## 2024-01-02 LAB — GLUCOSE, CAPILLARY
Glucose-Capillary: 129 mg/dL — ABNORMAL HIGH (ref 70–99)
Glucose-Capillary: 114 mg/dL — ABNORMAL HIGH (ref 70–99)
Glucose-Capillary: 115 mg/dL — ABNORMAL HIGH (ref 70–99)

## 2024-01-02 NOTE — Plan of Care (Signed)
   Problem: Education: Goal: Knowledge of General Education information will improve Description: Including pain rating scale, medication(s)/side effects and non-pharmacologic comfort measures Outcome: Progressing   Problem: Health Behavior/Discharge Planning: Goal: Ability to manage health-related needs will improve Outcome: Progressing   Problem: Nutrition: Goal: Adequate nutrition will be maintained Outcome: Progressing

## 2024-01-02 NOTE — Progress Notes (Signed)
 " PROGRESS NOTE  Terrance Miller  FMW:[id number] DOB: 1928-10-10 DOA: 12/08/2023 PCP: [name], DO   Brief Narrative:   89 y.o. male with medical history significant for HTN, HLD, MGUS/chronic microcytic anemia, CKD stage IIIB, diet-controlled diabetes mellitus type 2, chronic thrombocytopenia, dementia, HFpEF/chronic diastolic dysfunction CHF,, h/o CAD, PAFib, h/o BPH with history of prior hydrocelectomy admitted on 12/08/2023 from SNF facility with altered mentation/acute metabolic encephalopathy, hypothermia and hypotension with concerns for possible infection   Patient was admitted from 5/28 to 6/2 and presented with similar presentation where he had hypothermia, bradycardia and hypotension as well as C. difficile colitis. Patient is now struggling with acute metabolic encephalopathy and appears to have poor recovery of mental status despite treatment of sepsis as well as persistent hypoglycemia.  He overall has poor prognosis, but appears now more alert and after SLP evaluation is tolerating a pured diet.  He is now awaiting SNF/LTC placement.  Assessment & Plan:   Principal Problem:   Septic shock (HCC) Active Problems:   Palliative care by specialist  Assessment and Plan:  1)SIRS ---SIRS pathophysiology has resolved Hypotension and Hypothermia resolved initially Off Levophed  Urine cx neg, chest x-ray without definite pneumonia ---completed at least 10 days of broad-spectrum antibiotics already-including Cefepime  and Vanco -Recurrent episodes of hypothermia---currently off warming blanket -A.m. cortisol on 12/08/2022 was 20.9 -Recent ACTH  stimulation test during prior admission was WNL ----- TSH  3.6 - UA not suggestive of UTI - Repeat blood cultures from 12/20/2023 NGTD - patient is more awake and more alert since discontinuing scopolamine  patch   2)Persistent Hypoglycemia--due to inability of patient to take oral intake- -per daughter at least for the last couple of weeks  prior to admission to the hospital while at SNF patient was unable to eat safely he was pocketing food and the daughter at times had to take the food out of his mouth while at SNF --No further hypoglycemia while on iv dextrose - palliative care consult from 12/21/23 appreciated - Dr. Adron Fairly Discussed case with ethics -- Not a candidate for feeding tube due to advanced dementia --Reevaluated by speech pathologist and now with recommendations for Dysphagia 1/thin liquids with small sips with FULL supervision/A with meals when pt alert/responsive  -- 3)Acute on chronic macrocytic Anemia/MGUS/chronic thrombocytopenia-- -No obvious bleeding noted -There is been concern of MDS Versus other myeloproliferative disorder -Family previously deferred bone marrow biopsy - Hgb is up to around 8 from 6.9 after transfusion--, H&H may drop further due to I dextrose  iV fluids/hemodilution - Received 1 unit of PRBC on 12/18/2023--for total of 2 units of PRBC this admission -Platelets was down to 14 (at time of admission) - No overt bleeding - Continue avoiding heparin  products and use SCDs for DVT prophylaxis. - Most recent platelet count: > 100 K   4)Acute metabolic encephalopathy in the setting of SIRS--please see #1 above-now with persistent cognitive disorder with overall poor prognosis - -CT head without acute finding --Patient remains with very poor cognition -Excessive salivation improving with scopolamine  patch---May discontinue scopolamine  patch as this may be contributing to lethargy--May use Robinul  as needed excessive secretions -Mild elevation of serum ammonia and mild hypernatremia noted--not enough to explain extent of metabolic encephalopathy -patient is more awake and more alert since discontinuing scopolamine  patch   5)Acute kidney injury on chronic kidney disease 3B -Continue to monitor urine output -Creatinine currently less than 3 (creatinine peaked at 4.79 this admission) - Poor  baseline functional status with underlying dementia and deemed not  a candidate for dialysis.   6)Chronic diastolic heart failure -04/22/23 Echo--EF 50-55%, no WMA, normal RVF  -Patient unable to take oral medications safely at this time - No further IV fluid as patient will be on dysphagia 1 diet   7)Mild hypokalemia - Repleted   8-Transaminitis - In the setting of shock of liver - LFTs much improved overall    9)H/o Coronary artery disease --s/p DES x2 to RCA 2004  No ACS type symptoms at this time -Restart statin when safe to give oral intake - May give aspirin  rectally for now until safe to give orally   10)history of vascular dementia--per daughter Terrance--- patient's functional status declined prior to admission to the point of pocketing food and not being able to swallow and eat safely at SNF for the last couple of weeks prior to admission C/n supportive Rx -- 10)Ethics/social/failure to thrive - Goals of care and poor prognosis discussed with family members  - Primary contact is patient's daughter Ms Terrance Miller---[phone]  --Patient remains DNR/DNI- I have discussed with patient's POA (Ms. Miller).   --- Patient is Not a candidate for PEG tube given underlying dementia - Insurance declined possible transfer to LTAC -- Family conference at bedside previously had with patient's biological daughter Terrance Miller present, patient's adopted [name] present,other family members including [name] and [name] present at bedside. -- Family at this time reported that they are not ready to transition to comfort care - - Family reiterates that they are Not ready to de-escalate treatment protocols at this time - Please see palliative care provider note dated 12/21/2023   11)Nutrition --inability to safely swallow as outlined above #10 -- Speech pathologist evaluated patient multiple times --speech pathology recommends- Continue Dysphagia 1/thin liquids with small sips  with FULL supervision/A with meals when pt alert/responsive    DVT prophylaxis: SCDs Code Status: DNR/DNI Family Communication: Discussed extensively with daughter previously  Disposition Plan: Currently awaiting SNF/LTC placement Remains inpatient appropriate because: Need for ongoing IV fluids  Consultants:  Palliative care  Antimicrobials:  Anti-infectives (From admission, onward)    Start     Dose/Rate Route Frequency Ordered Stop   12/13/23 1200  vancomycin  (VANCOREADY) IVPB 750 mg/150 mL  Status:  Discontinued        750 mg 150 mL/hr over 60 Minutes Intravenous Every 48 hours 12/12/23 0925 12/12/23 1251   12/10/23 1200  vancomycin  (VANCOREADY) IVPB 750 mg/150 mL  Status:  Discontinued        750 mg 150 mL/hr over 60 Minutes Intravenous Every 48 hours 12/10/23 1022 12/12/23 0925   12/10/23 0500  ceFEPIme  (MAXIPIME ) 1 g in sodium chloride  0.9 % 100 mL IVPB        1 g 200 mL/hr over 30 Minutes Intravenous Every 24 hours 12/09/23 0753 12/17/23 0456   12/08/23 1327  vancomycin  variable dose per unstable renal function (pharmacist dosing)  Status:  Discontinued         Does not apply See admin instructions 12/08/23 1327 12/12/23 1251   12/08/23 0500  vancomycin  (VANCOREADY) IVPB 1500 mg/300 mL        1,500 mg 150 mL/hr over 120 Minutes Intravenous  Once 12/08/23 0459 12/08/23 0817   12/08/23 0500  ceFEPIme  (MAXIPIME ) 2 g in sodium chloride  0.9 % 100 mL IVPB  Status:  Discontinued        2 g 200 mL/hr over 30 Minutes Intravenous Every 24 hours 12/08/23 0500 12/09/23 0753  Subjective: - - Awake enough to take oral intake from time to time with persuasion and assistance -- No fevers, no emesis  Objective: Vitals:   01/01/24 1432 01/01/24 1936 01/02/24 0533 01/02/24 1339  BP: (!) 160/94 (!) 152/100 (!) 134/94 129/71  Pulse: 66 67 68 63  Resp: (!) 24 20 20 18   Temp: 97.8 F (36.6 C) 97.6 F (36.4 C) (!) 97.5 F (36.4 C) 97.7 F (36.5 C)  TempSrc: Oral Oral  Axillary Axillary  SpO2: 100% 100% 95% 96%  Weight:      Height:        Intake/Output Summary (Last 24 hours) at 01/02/2024 1829 Last data filed at 01/02/2024 1202 Gross per 24 hour  Intake 60 ml  Output 100 ml  Net -40 ml   Filed Weights   12/20/23 1512 12/20/23 1515 12/22/23 0628  Weight: 91.2 kg 91.2 kg 98.6 kg   Physical Exam Gen:-Patient is more awake Alert, in no acute distress  HEENT:- [place].AT, No sclera icterus Neck-Supple Neck,No JVD,.  Lungs-  CTAB , fair air movement bilaterally  CV- S1, S2 normal, RRR Abd-  +ve B.Sounds, Abd Soft, No tenderness,    Extremity/Skin:-Right upper extremity edema improving,   good pedal pulses Psych-affect is flat,, patient with cognitive and memory deficits,, patient is cooperative and able to answer simple questions with 1-2 word answers Neuro-generalized weakness, no new focal deficits, no tremors  Basic Metabolic Panel: Recent Labs  Lab 12/27/23 0406 12/28/23 0447  NA 137 139  K 3.5 3.6  CL 101 104  CO2 24 24  GLUCOSE 104* 106*  BUN 47* 43*  CREATININE 2.53* 2.33*  CALCIUM  8.8* 8.7*  MG 1.9 1.9   GFR: Estimated Creatinine Clearance: 22.6 mL/min (A) (by C-G formula based on SCr of 2.33 mg/dL (H)).  CBG: Recent Labs  Lab 01/01/24 1136 01/01/24 1651 01/01/24 2321 01/02/24 0537 01/02/24 1150  GLUCAP 88 121* 127* 129* 114*   Scheduled Meds:  aspirin   300 mg Rectal Q T,Th,S,Su   Chlorhexidine  Gluconate Cloth  6 each Topical Daily   liver oil-zinc  oxide   Topical BID   mupirocin  ointment   Nasal BID   mouth rinse  15 mL Mouth Rinse 4 times per day     LOS: 25 days    Rendall Carwin, MD [facility]  If 7PM-7AM, please contact night-coverage [email or web address] 01/02/2024, 6:29 PM   "

## 2024-01-02 NOTE — Plan of Care (Signed)

## 2024-01-03 DIAGNOSIS — R6521 Severe sepsis with septic shock: Secondary | ICD-10-CM | POA: Diagnosis not present

## 2024-01-03 DIAGNOSIS — A419 Sepsis, unspecified organism: Secondary | ICD-10-CM | POA: Diagnosis not present

## 2024-01-03 LAB — GLUCOSE, CAPILLARY
Glucose-Capillary: 102 mg/dL — ABNORMAL HIGH (ref 70–99)
Glucose-Capillary: 90 mg/dL (ref 70–99)
Glucose-Capillary: 93 mg/dL (ref 70–99)
Glucose-Capillary: 94 mg/dL (ref 70–99)
Glucose-Capillary: 98 mg/dL (ref 70–99)

## 2024-01-03 NOTE — Progress Notes (Signed)
 " PROGRESS NOTE  Terrance Miller  FMW:[id number] DOB: 29-Jul-1928 DOA: 12/08/2023 PCP: [name], DO  Brief Narrative:   89 y.o. male with medical history significant for HTN, HLD, MGUS/chronic microcytic anemia, CKD stage IIIB, diet-controlled diabetes mellitus type 2, chronic thrombocytopenia, dementia, HFpEF/chronic diastolic dysfunction CHF,, h/o CAD, PAFib, h/o BPH with history of prior hydrocelectomy admitted on 12/08/2023 from SNF facility with altered mentation/acute metabolic encephalopathy, hypothermia and hypotension with concerns for possible infection   Patient was admitted from 5/28 to 6/2 and presented with similar presentation where he had hypothermia, bradycardia and hypotension as well as C. difficile colitis. Patient is now struggling with acute metabolic encephalopathy and appears to have poor recovery of mental status despite treatment of sepsis as well as persistent hypoglycemia.  He overall has poor prognosis, but appears now more alert and after SLP evaluation is tolerating a pured diet.   - Patient remains stable for discharge to long-term care facility, will recommend ongoing palliative care involvement . -He is now awaiting SNF/LTC placement.  Assessment & Plan:   Principal Problem:   Septic shock (HCC) Active Problems:   Palliative care by specialist  Assessment and Plan: 1)SIRS ---SIRS pathophysiology has resolved Hypotension and Hypothermia resolved  Off Levophed  Urine cx neg, chest x-ray without definite pneumonia ---completed at least 10 days of broad-spectrum antibiotics already-including Cefepime  and Vanco -Recurrent episodes of hypothermia---currently off warming blanket -A.m. cortisol on 12/08/2022 was 20.9 -Recent ACTH  stimulation test during prior admission was WNL ----- TSH  3.6 - UA not suggestive of UTI - Repeat blood cultures from 12/20/2023 NGTD - patient is more awake and more alert since discontinuing scopolamine  patch   2)Persistent  Hypoglycemia--due to inability of patient to take oral intake- -per daughter at least for the last couple of weeks prior to admission to the hospital while at SNF patient was unable to eat safely he was pocketing food and the daughter at times had to take the food out of his mouth while at SNF --No further hypoglycemia while on iv dextrose - palliative care consult from 12/21/23 appreciated - Dr. Adron Fairly Discussed case with ethics -- Not a candidate for feeding tube due to advanced dementia --Reevaluated by speech pathologist and now with recommendations for Dysphagia 1/thin liquids with small sips with FULL supervision/A with meals when pt alert/responsive  -- 3)Acute on chronic macrocytic Anemia/MGUS/chronic thrombocytopenia-- -No obvious bleeding noted -There is been concern of MDS Versus other myeloproliferative disorder -Family previously deferred bone marrow biopsy - Hgb is up to around 8 from 6.9 after transfusion--, H&H may drop further due to I dextrose  iV fluids/hemodilution - Received 1 unit of PRBC on 12/18/2023--for total of 2 units of PRBC this admission -Platelets was down to 14 (at time of admission) - No overt bleeding - Continue avoiding heparin  products and use SCDs for DVT prophylaxis. - Most recent platelet count: > 100 K   4)Acute metabolic encephalopathy in the setting of SIRS--please see #1 above-now with persistent cognitive disorder with overall poor prognosis - -CT head without acute finding --Patient remains with very poor cognition -Excessive salivation improving with scopolamine  patch---May discontinue scopolamine  patch as this may be contributing to lethargy--May use Robinul  as needed excessive secretions -Mild elevation of serum ammonia and mild hypernatremia noted--not enough to explain extent of metabolic encephalopathy -patient is more awake and more alert since discontinuing scopolamine  patch   5)Acute kidney injury on chronic kidney disease  3B -Continue to monitor urine output -Creatinine currently less than 3 (creatinine  peaked at 4.79 this admission) - Poor baseline functional status with underlying dementia and deemed not a candidate for dialysis.   6)Chronic diastolic heart failure -04/22/23 Echo--EF 50-55%, no WMA, normal RVF  - No further IV fluid as patient will be on dysphagia 1 diet   7)Mild hypokalemia - Repleted   8-Transaminitis - In the setting of shock of liver - LFTs much improved overall    9)H/o Coronary artery disease --s/p DES x2 to RCA 2004  No ACS type symptoms at this time -Restart statin when safe to give oral intake - May give aspirin  rectally for now until safe to give orally   10)history of vascular dementia--per daughter Terrance--- patient's functional status declined prior to admission to the point of pocketing food and not being able to swallow and eat safely at SNF for the last couple of weeks prior to admission C/n supportive Rx -- 10)Ethics/social/failure to thrive - Goals of care and poor prognosis discussed with family members  - Primary contact is patient's daughter Ms Terrance Miller---[phone]  --Patient remains DNR/DNI- I have discussed with patient's POA (Ms. Miller).   --- Patient is Not a candidate for PEG tube given underlying dementia - Insurance declined possible transfer to LTAC -- Family conference at bedside previously had with patient's biological daughter Terrance Miller present, patient's adopted [name] present,other family members including Terrance Miller and Terrance Miller present at bedside. -- Family at this time reported that they are not ready to transition to comfort care - - Family reiterates that they are Not ready to de-escalate treatment protocols at this time - Please see palliative care provider note dated 12/21/2023   11)Nutrition --inability to safely swallow as outlined above #10 -- Speech pathologist evaluated patient multiple times --speech pathology  recommends- Continue Dysphagia 1/thin liquids with small sips with FULL supervision/A with meals when pt alert/responsive    DVT prophylaxis: SCDs Code Status: DNR/DNI Family Communication: Discussed extensively with daughter previously  Disposition Plan: Currently awaiting SNF/LTC placement - Patient remains stable for discharge to long-term care facility, will recommend ongoing palliative care involvement   Remains inpatient appropriate because:    Consultants:  Palliative care  Antimicrobials:  Anti-infectives (From admission, onward)    Start     Dose/Rate Route Frequency Ordered Stop   12/13/23 1200  vancomycin  (VANCOREADY) IVPB 750 mg/150 mL  Status:  Discontinued        750 mg 150 mL/hr over 60 Minutes Intravenous Every 48 hours 12/12/23 0925 12/12/23 1251   12/10/23 1200  vancomycin  (VANCOREADY) IVPB 750 mg/150 mL  Status:  Discontinued        750 mg 150 mL/hr over 60 Minutes Intravenous Every 48 hours 12/10/23 1022 12/12/23 0925   12/10/23 0500  ceFEPIme  (MAXIPIME ) 1 g in sodium chloride  0.9 % 100 mL IVPB        1 g 200 mL/hr over 30 Minutes Intravenous Every 24 hours 12/09/23 0753 12/17/23 0456   12/08/23 1327  vancomycin  variable dose per unstable renal function (pharmacist dosing)  Status:  Discontinued         Does not apply See admin instructions 12/08/23 1327 12/12/23 1251   12/08/23 0500  vancomycin  (VANCOREADY) IVPB 1500 mg/300 mL        1,500 mg 150 mL/hr over 120 Minutes Intravenous  Once 12/08/23 0459 12/08/23 0817   12/08/23 0500  ceFEPIme  (MAXIPIME ) 2 g in sodium chloride  0.9 % 100 mL IVPB  Status:  Discontinued  2 g 200 mL/hr over 30 Minutes Intravenous Every 24 hours 12/08/23 0500 12/09/23 0753       Subjective: - - Falls asleep on and off - Oral intake inconsistent - No fevers, no emesis - Patient remains stable for discharge to long-term care facility, will recommend ongoing palliative care involvement   Objective: Vitals:   01/02/24  1339 01/02/24 2027 01/03/24 0418 01/03/24 1243  BP: 129/71 (!) 156/82 (!) 158/95 136/70  Pulse: 63 66 71 62  Resp: 18 20 16 16   Temp: 97.7 F (36.5 C) 97.9 F (36.6 C) 98.7 F (37.1 C) (!) 97.5 F (36.4 C)  TempSrc: Axillary Axillary Axillary Oral  SpO2: 96% 99% 100% 98%  Weight:      Height:        Intake/Output Summary (Last 24 hours) at 01/03/2024 1454 Last data filed at 01/03/2024 0602 Gross per 24 hour  Intake 240 ml  Output 300 ml  Net -60 ml   Filed Weights   12/20/23 1512 12/20/23 1515 12/22/23 0628  Weight: 91.2 kg 91.2 kg 98.6 kg   Physical Exam Gen:-Patient is more awake Alert, in no acute distress  HEENT:- .AT, No sclera icterus Neck-Supple Neck,No JVD,.  Lungs-  CTAB , fair air movement bilaterally  CV- S1, S2 normal, RRR Abd-  +ve B.Sounds, Abd Soft, No tenderness,    Extremity/Skin:-Right upper extremity edema improving,   good pedal pulses Psych-affect is flat,, patient with cognitive and memory deficits,, patient is cooperative and able to answer simple questions with 1-2 word answers Neuro-generalized weakness, no new focal deficits, no tremors  Basic Metabolic Panel: Recent Labs  Lab 12/28/23 0447  NA 139  K 3.6  CL 104  CO2 24  GLUCOSE 106*  BUN 43*  CREATININE 2.33*  CALCIUM  8.7*  MG 1.9   GFR: Estimated Creatinine Clearance: 22.6 mL/min (A) (by C-G formula based on SCr of 2.33 mg/dL (H)).  CBG: Recent Labs  Lab 01/02/24 1150 01/02/24 1833 01/03/24 0002 01/03/24 0612 01/03/24 1121  GLUCAP 114* 115* 102* 98 90   Scheduled Meds:  aspirin   300 mg Rectal Q T,Th,S,Su   Chlorhexidine  Gluconate Cloth  6 each Topical Daily   liver oil-zinc  oxide   Topical BID   mupirocin  ointment   Nasal BID   mouth rinse  15 mL Mouth Rinse 4 times per day     LOS: 26 days   Rendall Carwin, MD [facility]  If 7PM-7AM, please contact night-coverage [email or web address] 01/03/2024, 2:54 PM   "

## 2024-01-03 NOTE — Plan of Care (Signed)
 Patient is disoriented X4 and non-interactive. High aspiration risk, dependent for turning, and not eating or drinking fluids at this time.

## 2024-01-03 NOTE — Plan of Care (Signed)
  Problem: Education: Goal: Knowledge of General Education information will improve Description: Including pain rating scale, medication(s)/side effects and non-pharmacologic comfort measures Outcome: Not Progressing   Problem: Health Behavior/Discharge Planning: Goal: Ability to manage health-related needs will improve Outcome: Not Progressing   Problem: Nutrition: Goal: Adequate nutrition will be maintained Outcome: Not Progressing   

## 2024-01-04 DIAGNOSIS — R6521 Severe sepsis with septic shock: Secondary | ICD-10-CM | POA: Diagnosis not present

## 2024-01-04 DIAGNOSIS — A419 Sepsis, unspecified organism: Secondary | ICD-10-CM | POA: Diagnosis not present

## 2024-01-04 LAB — GLUCOSE, CAPILLARY
Glucose-Capillary: 96 mg/dL (ref 70–99)
Glucose-Capillary: 87 mg/dL (ref 70–99)
Glucose-Capillary: 87 mg/dL (ref 70–99)

## 2024-01-04 MED ORDER — POLYETHYLENE GLYCOL 3350 17 G PO PACK: 17.0000 g | PACK | Freq: Every day | ORAL | 2 refills | Status: DC

## 2024-01-04 NOTE — TOC Transition Note (Signed)
 Transition of Care [facility]) - Discharge Note   Patient Details  Name: Terrance Miller MRN: [id number] Date of Birth: [birth year]  [facility]) CM/SW Contact:  Mcarthur Saddie Kim, LCSW Phone Number: 01/04/2024, 1:45 PM   Clinical Narrative:  Pt d/c today back to [facility]. Pt's daughter, [name] and facility aware and agreeable. D/C summary sent to SNF. Will transport via [facility]. RN given number to call report.      Final next level of care: Skilled Nursing Facility Barriers to Discharge: Barriers Resolved   Patient Goals and CMS Choice Patient states their goals for this hospitalization and ongoing recovery are:: return to [email or web address].gov Compare Post Acute Care list provided to:: Patient Represenative (must comment) Choice offered to / list presented to : Adult Children [place] ownership interest in [facility].provided to:: Adult Children    Discharge Placement                Patient to be transferred to facility by: [facility] Name of family member notified: daughter, [name] Patient and family notified of of transfer: 01/04/24  Discharge Plan and Services Additional resources added to the After Visit Summary for                                       Social Drivers of Health (SDOH) Interventions SDOH Screenings   Food Insecurity: No Food Insecurity (12/08/2023)  Housing: Low Risk (12/08/2023)  Transportation Needs: No Transportation Needs (12/08/2023)  Utilities: Not At Risk (12/08/2023)  Alcohol Screen: Low Risk (01/05/2023)  Depression (PHQ2-9): Medium Risk (03/17/2023)  Financial Resource Strain: Low Risk (01/05/2023)  Physical Activity: Inactive (01/05/2023)  Social Connections: Unknown (06/03/2023)  Recent Concern: Social Connections - Socially Isolated (05/04/2023)  Stress: No Stress Concern Present (01/05/2023)  Tobacco Use: Medium Risk (12/08/2023)  Health Literacy: Adequate Health Literacy  (01/05/2023)     Readmission Risk Interventions    12/30/2023   12:26 PM 12/08/2023    4:11 PM 06/04/2023    3:46 PM  Readmission Risk Prevention Plan  Transportation Screening Complete Complete Complete  Medication Review [employer]) Complete Complete Complete  PCP or Specialist appointment within 3-5 days of discharge  Not Complete   HRI or Home Care Consult Complete Complete Complete  SW Recovery Care/Counseling Consult Complete Complete Complete  Palliative Care Screening Patient Refused Not Complete Not Applicable  Comments Family refused    Skilled Nursing Facility Complete Complete Complete

## 2024-01-04 NOTE — Discharge Instructions (Signed)
 Dysphagia 1 diet----Oral intake only when Pt is alert, upright, and willing to take, oral medication crushed as able in puree. Please use straw for liquid intake -Dysphagia 1/thin liquids with small sips with FULL supervision/A with meals when pt alert/responsive  -- Please keep head of bed elevated at least 45 degrees while feeding patient to reduce risk of aspiration Dysphagia 1 diet----Oral intake only when Pt is alert, upright, and willing to take, oral medication crushed as able in puree. Please use straw for liquid intake -Reevaluated by speech pathologist and now with recommendations for Dysphagia 1/thin liquids with small sips with FULL supervision/A with meals when pt alert/responsive  -- Please keep head of bed elevated at least 45 degrees while feeding patient to reduce risk of aspiration --Patient requires a lot of prompting,  encouragement and significant other time to eat and drink

## 2024-01-04 NOTE — Progress Notes (Signed)
 Report called to brittany at [facility]

## 2024-01-04 NOTE — Plan of Care (Signed)
   Problem: Education: Goal: Knowledge of General Education information will improve Description: Including pain rating scale, medication(s)/side effects and non-pharmacologic comfort measures Outcome: Not Progressing   Problem: Health Behavior/Discharge Planning: Goal: Ability to manage health-related needs will improve Outcome: Not Progressing

## 2024-01-04 NOTE — Discharge Summary (Signed)
 "                                                                                 Terrance Miller, is a 89 y.o. male  DOB 02-Nov-1928  MRN [id number].  Admission date:  12/08/2023  Admitting Physician  Posey Maier, DO  Discharge Date:  01/04/2024   Primary MD  Cook, [name], DO  Recommendations for primary care physician for things to follow:  Dysphagia 1 diet----Oral intake only when Pt is alert, upright, and willing to take, oral medication crushed as able in puree. Please use straw for liquid intake -Dysphagia 1/thin liquids with small sips with FULL supervision/A with meals when pt alert/responsive  -- Please keep head of bed elevated at least 45 degrees while feeding patient to reduce risk of aspiration Dysphagia 1 diet----Oral intake only when Pt is alert, upright, and willing to take, oral medication crushed as able in puree. Please use straw for liquid intake -Reevaluated by speech pathologist and now with recommendations for Dysphagia 1/thin liquids with small sips with FULL supervision/A with meals when pt alert/responsive  -- Please keep head of bed elevated at least 45 degrees while feeding patient to reduce risk of aspiration --Patient requires a lot of prompting,  encouragement and significant other time to eat and drink  Admission Diagnosis  Dehydration [E86.0] Bradycardia [R00.1] Septic shock (HCC) [A41.9, R65.21] Hypotension, unspecified hypotension type [I95.9] Acute renal failure, unspecified acute renal failure type [N17.9]   Discharge Diagnosis  Dehydration [E86.0] Bradycardia [R00.1] Septic shock (HCC) [A41.9, R65.21] Hypotension, unspecified hypotension type [I95.9] Acute renal failure, unspecified acute renal failure type [N17.9]    Principal Problem:   Septic shock (HCC) Active Problems:   Palliative care by specialist      Past Medical History:  Diagnosis Date   Acute ST elevation myocardial infarction (STEMI) of inferior wall (HCC) 2004   Coronary  artery disease    DES x2 to the RCA 2004, residual disease managed medically   Hyperlipidemia    Hypertension    Type 2 diabetes mellitus (HCC)     Past Surgical History:  Procedure Laterality Date   BIOPSY  01/28/2022   Procedure: BIOPSY;  Surgeon: Eartha Flavors, Toribio, MD;  Location: [facility];  Service: Gastroenterology;;   CATARACT EXTRACTION Bilateral    CORONARY ANGIOPLASTY WITH STENT PLACEMENT  09/05/2002   stent RCA, 80% first diagonal, 70-80% mid-diagonal, 80% mid LAD stenosis   FLEXIBLE SIGMOIDOSCOPY N/A 01/28/2022   Procedure: FLEXIBLE SIGMOIDOSCOPY;  Surgeon: Eartha Flavors Toribio, MD;  Location: [facility];  Service: Gastroenterology;  Laterality: N/A;   HYDROCELE EXCISION Bilateral 11/20/2021   Procedure: HYDROCELECTOMY ADULT;  Surgeon: Roseann Adine PARAS., MD;  Location: [facility];  Service: Urology;  Laterality: Bilateral;   IMPACTION REMOVAL  01/28/2022   Procedure: IMPACTION REMOVAL;  Surgeon: Eartha Flavors Toribio, MD;  Location: [facility];  Service: Gastroenterology;;   NM MYOCAR PERF WALL MOTION  11/01/2008   Normal     HPI  from the history and physical done on the day of admission:   HPI: Terrance Miller is a 89 y.o. male with medical history significant of hypertension, hyperlipidemia, MGUS, CKD stage  III, diet-controlled diabetes mellitus type 2, thrombocytopenia, dementia, HFpEF, coronary disease who presents to the emergency department via EMS due to altered mental status.  She was noted with altered mental status for the SNF yesterday in the evening with heart rate in 30s and patient was hypotensive with SBP in the 70s.   Patient was admitted from 5/28 to 6/2 and presented with similar presentation where he had hypothermia and hypotension as well as C. difficile colitis.   ED course In the emergency department, he was hypothermic with temperature 86.22F, respiratory rate 17, pulse 27 bpm, BP 67/40, O2 sats was 88% on 6 LPM.  Workup in the ED  showed macrocytic anemia with MCV of 103.8, hemoglobin 7.0, platelets 14.  BMP shows sodium of 148, potassium 4.8, chloride 117, bicarb 14 blood glucose 118, BUN 77, creatinine 4.18 calcium  8.4, albumin 3.4, AST 128, ALT 149, ALP 151, eGFR 13, anion gap 18 Chest x-ray showed stable cardiomegaly with low lung volumes with mild bibasilar atelectasis Patient was started on IV vancomycin  and cefepime .  IV NS 2 L was provided in the ED Discussion was held with daughter at bedside who wanted to try peripheral IV pressors, but does not want any more invasive procedures and was willing to make patient comfort care if patient does not respond to pressors and antibiotics.  She objected to catheterizing patient to obtain urinalysis.     Review of Systems: As mentioned in the history of present illness. All other systems reviewed and are negative.    Hospital Course:    Assessment and Plan: 1)SIRS ---SIRS pathophysiology has resolved Hypotension and Hypothermia resolved  Off Levophed  Urine cx neg, chest x-ray without definite pneumonia ---completed at least 10 days of broad-spectrum antibiotics already-including Cefepime  and Vanco -Recurrent episodes of hypothermia---currently off warming blanket -A.m. cortisol on 12/08/2022 was 20.9 -Recent ACTH  stimulation test during prior admission was WNL ----- TSH  3.6 - UA Not suggestive of UTI - Repeat blood cultures from 12/20/2023 NGTD - patient is more awake and more alert since discontinuing scopolamine  patch   2)Persistent Hypoglycemia--due to inability of patient to take oral intake- -per daughter at least for the last couple of weeks prior to admission to the hospital while at SNF patient was unable to eat safely he was pocketing food and the daughter at times had to take the food out of his mouth while at SNF palliative care consult from 12/21/23 appreciated - Dr. Adron Fairly Discussed case with ethics -- Not a candidate for feeding tube due to  advanced dementia --Reevaluated by speech pathologist and now with recommendations for Dysphagia 1/thin liquids with small sips with FULL supervision/A with meals when pt alert/responsive  --No further hypoglycemia as long as he is able to eat -Has been off IV dextrose  for several days now -- 3)Acute on chronic macrocytic Anemia/MGUS/chronic thrombocytopenia/CKD-- -No obvious bleeding noted -There is been concern of MDS Versus other myeloproliferative disorder -Family previously deferred bone marrow biopsy - Hgb is up to around 8 from 6.9 after transfusion--, H&H may drop further due to I dextrose  iV fluids/hemodilution - Received 1 unit of PRBC on 12/18/2023--for total of 2 units of PRBC this admission -Platelets was down to 14 (at time of admission) - No overt bleeding - Continue avoiding heparin  products and use SCDs for DVT prophylaxis. - Most recent platelet count: > 100 K   4)Acute metabolic encephalopathy in the setting of SIRS--please see #1 above-now with persistent cognitive disorder with overall poor prognosis - -  CT head without acute finding --Patient remains with very poor cognition -Excessive salivation improving with scopolamine  patch---May discontinue scopolamine  patch as this may be contributing to lethargy--May use Robinul  as needed excessive secretions -Mild hypernatremia resolved -patient is more awake and more alert since discontinuing scopolamine  patch   5)Acute kidney injury on chronic kidney disease 3B -Continue to monitor urine output -Creatinine currently  down to 2.3 (creatinine peaked at 4.79 this admission) - Poor baseline functional status with underlying dementia and deemed not a candidate for dialysis.   6)Chronic diastolic heart failure -04/22/23 Echo--EF 50-55%, no WMA, normal RVF  - No further IV fluid as patient will be on dysphagia 1 diet   7)Mild hypokalemia - Normalized with replacement  8-Transaminitis--Mild - In the setting of shock of  liver - LFTs much improved overall     9)H/o Coronary artery disease --s/p DES x2 to RCA 2004  No ACS type symptoms at this time -Restart statin when safe to give oral intake - Continue aspirin    10)history of vascular dementia--per daughter [name]--- patient's functional status declined prior to admission to the point of pocketing food and not being able to swallow and eat safely at SNF for the last couple of weeks prior to admission C/n supportive Rx -- 10)Ethics/social/failure to thrive - Goals of care and poor prognosis discussed with family members  - Primary contact is patient's daughter [name]---[phone]  --Patient remains DNR/DNI- I have discussed with patient's POA ([name]).   --- Patient is Not a candidate for PEG tube given underlying dementia - Insurance declined possible transfer to LTAC -- Family conference at bedside previously had with patient's biological daughter [name] present, patient's adopted [name] present,other family members including [name] and [name] present at bedside. -- Family at this time reported that they are Not ready to transition to comfort care - - Family reiterates that they are Not ready to de-escalate treatment protocols at this time - Please see palliative care provider note dated 12/21/2023   11)Nutrition -dysphagia -- Speech pathologist evaluated patient multiple times --speech pathology recommends- Continue Dysphagia 1/thin liquids with small sips with FULL supervision/A with meals when pt alert/responsive   -Dysphagia 1 diet----Oral intake only when Pt is alert, upright, and willing to take, oral medication crushed as able in puree. Please use straw for liquid intake -Dysphagia 1/thin liquids with small sips with FULL supervision/A with meals when pt alert/responsive  -- Please keep head of bed elevated at least 45 degrees while feeding patient to reduce risk of aspiration Dysphagia 1 diet----Oral intake  only when Pt is alert, upright, and willing to take, oral medication crushed as able in puree. Please use straw for liquid intake -Reevaluated by speech pathologist and now with recommendations for Dysphagia 1/thin liquids with small sips with FULL supervision/A with meals when pt alert/responsive  -- Please keep head of bed elevated at least 45 degrees while feeding patient to reduce risk of aspiration --Patient requires a lot of prompting,  encouragement and significant other time to eat and drink   Discharge Condition: Stable  Follow UP   Follow-up Information     Cook, [name], DO. Schedule an appointment as soon as possible for a visit in 1 week(s).   Specialty: Family Medicine Contact information: [address] [place] [address] [phone]                  Consults obtained -palliative care/speech pathologist  Diet and Activity  recommendation:  As advised  Discharge Instructions   Discharge Instructions     Call MD for:  difficulty breathing, headache or visual disturbances   Complete by: As directed    Call MD for:  persistant nausea and vomiting   Complete by: As directed    Call MD for:  temperature >100.4   Complete by: As directed    DIET - DYS 1 Room service appropriate? Yes; Fluid consistency: Thin   Complete by: As directed    Oral intake only when Pt is alert, upright, and willing to take, oral medication crushed as able in puree. Please use straw for liquid intake   Fluid consistency: Thin   Diet general   Complete by: As directed    Dysphagia 1 diet---- oral medication crushed as able in puree. Please use straw for liquid intake -Dysphagia 1/thin liquids with small sips with FULL supervision/A with meals when pt alert/responsive  -- Please keep head of bed elevated at least 45 degrees while feeding patient to reduce risk of aspiration --Patient requires a lot of prompting,  encouragement and significant other time to eat and drink   -    Discharge instructions   Complete by: As directed    Dysphagia 1 diet----Oral intake only when Pt is alert, upright, and willing to take, oral medication crushed as able in puree. Please use straw for liquid intake -Dysphagia 1/thin liquids with small sips with FULL supervision/A with meals when pt alert/responsive  -- Please keep head of bed elevated at least 45 degrees while feeding patient to reduce risk of aspiration Dysphagia 1 diet----Oral intake only when Pt is alert, upright, and willing to take, oral medication crushed as able in puree. Please use straw for liquid intake -Reevaluated by speech pathologist and now with recommendations for Dysphagia 1/thin liquids with small sips with FULL supervision/A with meals when pt alert/responsive  -- Please keep head of bed elevated at least 45 degrees while feeding patient to reduce risk of aspiration --Patient requires a lot of prompting,  encouragement and significant other time to eat and drink   Discharge wound care:   Complete by: As directed    As above   Increase activity slowly   Complete by: As directed    Increase activity slowly   Complete by: As directed    No wound care   Complete by: As directed          Discharge Medications     Allergies as of 01/04/2024       Reactions   Clobetasol  Other (See Comments)   Unknown  No reaction listed on MAR from facility        Medication List     STOP taking these medications    amLODipine  10 MG tablet Commonly known as: NORVASC    amoxicillin -clavulanate 500-125 MG tablet Commonly known as: AUGMENTIN    furosemide  40 MG tablet Commonly known as: LASIX    guaiFENesin  600 MG 12 hr tablet Commonly known as: MUCINEX        TAKE these medications    ammonium lactate 12 % lotion Commonly known as: LAC-HYDRIN Apply 1 Application topically as needed for dry skin.   aspirin  EC 81 MG tablet Take 1 tablet (81 mg total) by mouth daily with breakfast. Swallow whole.    bisacodyl  10 MG suppository Commonly known as: DULCOLAX Place 1 suppository (10 mg total) rectally every Monday, Wednesday, and Friday.   cholecalciferol  1000 units tablet Commonly known as: VITAMIN D  Take 1,000 Units  by mouth daily.   feeding supplement Liqd Take 237 mLs by mouth 2 (two) times daily between meals. What changed: when to take this   ipratropium-albuterol  0.5-2.5 (3) MG/3ML Soln Commonly known as: DUONEB Take 3 mLs by nebulization every 4 (four) hours as needed (wheezing/dyspnea/O2 sats <90%).   latanoprost  0.005 % ophthalmic solution Commonly known as: XALATAN  Place 1 drop into both eyes 2 (two) times daily.   melatonin 3 MG Tabs tablet Take 3 mg by mouth at bedtime.   memantine  5 MG tablet Commonly known as: NAMENDA  Take 5 mg by mouth 2 (two) times daily.   polyethylene glycol 17 g packet Commonly known as: MIRALAX  / GLYCOLAX  Take 17 g by mouth daily.   senna 8.6 MG Tabs tablet Commonly known as: SENOKOT Take 2 tablets (17.2 mg total) by mouth at bedtime.   sertraline  25 MG tablet Commonly known as: ZOLOFT  Take 25 mg by mouth daily.   tamsulosin  0.4 MG Caps capsule Commonly known as: FLOMAX  Take 1 capsule (0.4 mg total) by mouth daily.               Discharge Care Instructions  (From admission, onward)           Start     Ordered   01/04/24 0000  Discharge wound care:       Comments: As above   01/04/24 1331            Major procedures and Radiology Reports - PLEASE review detailed and final reports for all details, in brief -   CT HEAD WO CONTRAST ( ) Result Date: 12/17/2023 EXAM: CT Head Without Intravenous Contrast. CLINICAL HISTORY: 89 year old male with acute neurological deficit, stroke suspected. TECHNIQUE: Axial computed tomography images of the head/brain without intravenous contrast. Dose reduction technique was used including one or more of the following: automated exposure control, adjustment of mA and kV  according to patient size, and/or iterative reconstruction. CONTRAST: Without. COMPARISON: Brain MRI 05/11/2023, Head CT 05/28/2023. FINDINGS: LIMITATIONS/ARTIFACTS: Mild motion artifact Miller. BRAIN: No acute intraparenchymal hemorrhage. No mass lesion. No CT evidence for acute territorial infarct. No midline shift or extra-axial collection. Stable brain volume. Gray white matter differentiation stable and normal for age. Incidental dural calcification. No suspicious intracranial vascular hyperdensity. Advanced calcified atherosclerosis at the central skull base. VENTRICLES: No hydrocephalus. ORBITS: The orbits are unremarkable. SINUSES AND MASTOIDS: Chronic paranasal sinus disease with mucoperiosteal thickening but improved compared to 05/28/2023. Tympanic cavities and right mastoids are clear. Left mastoid effusion has also regressed since 05/28/2023. SOFT TISSUES: Calcified scalp vessel atherosclerosis. No acute orbit or scalp soft tissue finding. No radiopaque foreign body is seen. BONES: No acute skull fracture. IMPRESSION: 1. Stable and negative for age non-contrast head CT appearance of the brain. 2. Advanced calcified atherosclerosis 3. Chronic paranasal sinus disease improved since May. Electronically signed by: Helayne Hurst MD 12/17/2023 12:38 PM EST RP Workstation: [id number]   DG Chest Port 1 View Result Date: 12/08/2023 CLINICAL DATA:  Hypotension. EXAM: PORTABLE CHEST 1 VIEW COMPARISON:  Jun 03, 2023 FINDINGS: Limited study secondary to patient rotation. The cardiac silhouette is mildly enlarged. This may be, in part, technical in origin. There is marked severity calcification of the aortic arch. Low lung volumes are noted with mild atelectatic changes seen within the bilateral lung bases. No pleural effusion or pneumothorax is identified. Multilevel degenerative changes are present throughout the thoracic spine. IMPRESSION: Stable cardiomegaly with low lung volumes with mild bibasilar atelectasis.  Electronically Signed   By:  Suzen Dials M.D.   On: 12/08/2023 00:40   Miller   Subjective    Terrance Miller has no new concerns - Oral intake remains erratic--requires lots of prompting and encouragement to accept oral intake - Patient needs Graig is at bedside, patient's daughter [name] is on speaker phone----conference at bedside--questions answered     No fevers, no emesis   Patient has been seen and examined prior to discharge   Objective   Blood pressure (!) 150/86, pulse 64, temperature (!) 97.5 F (36.4 C), temperature source Oral, resp. rate 19, height 5' 8 (1.727 m), weight 98.6 kg, SpO2 98%.  No intake or output data in the 24 hours ending 01/04/24 1332  Exam Gen:- Awake Alert, no acute distress  HEENT:- [place].AT, No sclera icterus Neck-Supple Neck,No JVD,.  Lungs-  CTAB , good air movement bilaterally CV- S1, S2 normal, regular Abd-  +ve B.Sounds, Abd Soft, No tenderness,    Extremity/Skin:- No  edema,   good pulses Psych-affect is flat, patient with cognitive and memory deficits,, patient is cooperative and able to answer simple questions with 1-2 word answers  Neuro-generalized weakness, bedbound, no new focal deficits, no tremors   Data Review   CBC w Diff:  Lab Results  Component Value Date   WBC 5.3 12/21/2023   HGB 8.0 (L) 12/22/2023   HGB 9.7 (L) 03/17/2023   HCT 25.0 (L) 12/22/2023   HCT 28.8 (L) 03/17/2023   PLT 112 (L) 12/21/2023   PLT 166 03/17/2023   LYMPHOPCT 6 12/08/2023   BANDSPCT 1 07/29/2022   MONOPCT 3 12/08/2023   EOSPCT 0 12/08/2023   BASOPCT 0 12/08/2023   CMP:  Lab Results  Component Value Date   NA 139 12/28/2023   NA 142 03/17/2023   K 3.6 12/28/2023   CL 104 12/28/2023   CO2 24 12/28/2023   BUN 43 (H) 12/28/2023   BUN 21 03/17/2023   CREATININE 2.33 (H) 12/28/2023   CREATININE 1.26 09/28/2013   PROT 6.6 12/23/2023   PROT 7.2 03/17/2023   ALBUMIN 3.2 (L) 12/23/2023   ALBUMIN 3.9 03/17/2023   BILITOT 2.9 (H)  12/23/2023   BILITOT 1.2 03/17/2023   ALKPHOS 140 (H) 12/23/2023   AST 45 (H) 12/23/2023   ALT 40 12/23/2023   Total Discharge time is about 33 minutes  Terrance Miller M.D on 01/04/2024 at 1:32 PM  Go to [email or web address] -  for contact info  [facility] - Office  [phone]   "

## 2024-01-04 NOTE — Plan of Care (Signed)

## 2024-02-07 DEATH — deceased
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
